# Patient Record
Sex: Female | Born: 1945 | Hispanic: No | Smoking: Never smoker
Health system: Southern US, Community
[De-identification: ages and names within clinical notes are randomized; demographics above are authoritative.]

## PROBLEM LIST (undated history)

## (undated) DIAGNOSIS — M069 Rheumatoid arthritis, unspecified: Secondary | ICD-10-CM

## (undated) DIAGNOSIS — T4145XA Adverse effect of unspecified anesthetic, initial encounter: Secondary | ICD-10-CM

## (undated) DIAGNOSIS — R053 Chronic cough: Secondary | ICD-10-CM

## (undated) DIAGNOSIS — M4316 Spondylolisthesis, lumbar region: Secondary | ICD-10-CM

## (undated) DIAGNOSIS — M51369 Other intervertebral disc degeneration, lumbar region without mention of lumbar back pain or lower extremity pain: Secondary | ICD-10-CM

## (undated) DIAGNOSIS — J189 Pneumonia, unspecified organism: Secondary | ICD-10-CM

## (undated) DIAGNOSIS — E785 Hyperlipidemia, unspecified: Secondary | ICD-10-CM

## (undated) DIAGNOSIS — I10 Essential (primary) hypertension: Secondary | ICD-10-CM

## (undated) DIAGNOSIS — N184 Chronic kidney disease, stage 4 (severe): Secondary | ICD-10-CM

## (undated) DIAGNOSIS — Z7961 Long term (current) use of immunomodulator: Secondary | ICD-10-CM

## (undated) DIAGNOSIS — Z972 Presence of dental prosthetic device (complete) (partial): Secondary | ICD-10-CM

## (undated) DIAGNOSIS — M109 Gout, unspecified: Secondary | ICD-10-CM

## (undated) DIAGNOSIS — K625 Hemorrhage of anus and rectum: Secondary | ICD-10-CM

## (undated) DIAGNOSIS — M329 Systemic lupus erythematosus, unspecified: Secondary | ICD-10-CM

## (undated) DIAGNOSIS — M1990 Unspecified osteoarthritis, unspecified site: Secondary | ICD-10-CM

## (undated) DIAGNOSIS — I4510 Unspecified right bundle-branch block: Secondary | ICD-10-CM

## (undated) DIAGNOSIS — E119 Type 2 diabetes mellitus without complications: Secondary | ICD-10-CM

## (undated) DIAGNOSIS — T8859XA Other complications of anesthesia, initial encounter: Secondary | ICD-10-CM

## (undated) DIAGNOSIS — D631 Anemia in chronic kidney disease: Secondary | ICD-10-CM

## (undated) DIAGNOSIS — M778 Other enthesopathies, not elsewhere classified: Secondary | ICD-10-CM

## (undated) DIAGNOSIS — N2581 Secondary hyperparathyroidism of renal origin: Secondary | ICD-10-CM

## (undated) DIAGNOSIS — H9190 Unspecified hearing loss, unspecified ear: Secondary | ICD-10-CM

## (undated) DIAGNOSIS — I499 Cardiac arrhythmia, unspecified: Secondary | ICD-10-CM

## (undated) DIAGNOSIS — J986 Disorders of diaphragm: Secondary | ICD-10-CM

## (undated) DIAGNOSIS — U099 Post covid-19 condition, unspecified: Secondary | ICD-10-CM

## (undated) DIAGNOSIS — N189 Chronic kidney disease, unspecified: Secondary | ICD-10-CM

## (undated) DIAGNOSIS — I5189 Other ill-defined heart diseases: Secondary | ICD-10-CM

## (undated) DIAGNOSIS — G4700 Insomnia, unspecified: Secondary | ICD-10-CM

## (undated) DIAGNOSIS — Z7901 Long term (current) use of anticoagulants: Secondary | ICD-10-CM

## (undated) DIAGNOSIS — I2510 Atherosclerotic heart disease of native coronary artery without angina pectoris: Secondary | ICD-10-CM

## (undated) DIAGNOSIS — N281 Cyst of kidney, acquired: Secondary | ICD-10-CM

## (undated) DIAGNOSIS — R911 Solitary pulmonary nodule: Secondary | ICD-10-CM

## (undated) DIAGNOSIS — M5030 Other cervical disc degeneration, unspecified cervical region: Secondary | ICD-10-CM

## (undated) DIAGNOSIS — G4730 Sleep apnea, unspecified: Secondary | ICD-10-CM

## (undated) DIAGNOSIS — R011 Cardiac murmur, unspecified: Secondary | ICD-10-CM

## (undated) DIAGNOSIS — M436 Torticollis: Secondary | ICD-10-CM

## (undated) DIAGNOSIS — G4733 Obstructive sleep apnea (adult) (pediatric): Secondary | ICD-10-CM

## (undated) DIAGNOSIS — I671 Cerebral aneurysm, nonruptured: Secondary | ICD-10-CM

## (undated) DIAGNOSIS — R9389 Abnormal findings on diagnostic imaging of other specified body structures: Secondary | ICD-10-CM

## (undated) DIAGNOSIS — I739 Peripheral vascular disease, unspecified: Secondary | ICD-10-CM

## (undated) DIAGNOSIS — K862 Cyst of pancreas: Secondary | ICD-10-CM

## (undated) DIAGNOSIS — E875 Hyperkalemia: Secondary | ICD-10-CM

## (undated) DIAGNOSIS — R51 Headache: Secondary | ICD-10-CM

## (undated) DIAGNOSIS — Z7902 Long term (current) use of antithrombotics/antiplatelets: Secondary | ICD-10-CM

## (undated) HISTORY — PX: CATARACT EXTRACTION: SUR2

## (undated) HISTORY — PX: CERVICAL FUSION: SHX112

## (undated) HISTORY — PX: CERVICAL SPINE SURGERY: SHX589

## (undated) HISTORY — PX: BACK SURGERY: SHX140

## (undated) HISTORY — PX: BARTHOLIN CYST MARSUPIALIZATION: SHX5383

## (undated) HISTORY — PX: EYE SURGERY: SHX253

---

## 2005-05-27 ENCOUNTER — Ambulatory Visit: Payer: Self-pay | Admitting: Internal Medicine

## 2005-06-25 ENCOUNTER — Ambulatory Visit (HOSPITAL_COMMUNITY): Admission: RE | Admit: 2005-06-25 | Discharge: 2005-06-26 | Payer: Self-pay | Admitting: Neurosurgery

## 2005-12-23 ENCOUNTER — Ambulatory Visit: Payer: Self-pay | Admitting: Internal Medicine

## 2006-01-04 ENCOUNTER — Encounter: Payer: Self-pay | Admitting: Neurosurgery

## 2006-01-19 ENCOUNTER — Encounter: Payer: Self-pay | Admitting: Neurosurgery

## 2006-02-19 ENCOUNTER — Encounter: Payer: Self-pay | Admitting: Neurosurgery

## 2006-02-26 ENCOUNTER — Ambulatory Visit: Payer: Self-pay | Admitting: Internal Medicine

## 2006-05-25 ENCOUNTER — Ambulatory Visit (HOSPITAL_COMMUNITY): Admission: RE | Admit: 2006-05-25 | Discharge: 2006-05-25 | Payer: Self-pay | Admitting: Internal Medicine

## 2006-05-30 ENCOUNTER — Ambulatory Visit (HOSPITAL_COMMUNITY): Admission: RE | Admit: 2006-05-30 | Discharge: 2006-05-30 | Payer: Self-pay | Admitting: Internal Medicine

## 2006-06-03 ENCOUNTER — Encounter: Payer: Self-pay | Admitting: Interventional Radiology

## 2006-06-07 DIAGNOSIS — I671 Cerebral aneurysm, nonruptured: Secondary | ICD-10-CM

## 2006-06-07 HISTORY — DX: Cerebral aneurysm, nonruptured: I67.1

## 2006-06-17 ENCOUNTER — Ambulatory Visit (HOSPITAL_COMMUNITY): Admission: RE | Admit: 2006-06-17 | Discharge: 2006-06-17 | Payer: Self-pay | Admitting: Interventional Radiology

## 2006-06-21 HISTORY — PX: CEREBRAL ANEURYSM REPAIR: SHX164

## 2006-06-21 HISTORY — PX: CARDIAC CATHETERIZATION: SHX172

## 2006-07-04 ENCOUNTER — Ambulatory Visit (HOSPITAL_COMMUNITY): Admission: RE | Admit: 2006-07-04 | Discharge: 2006-07-04 | Payer: Self-pay | Admitting: Interventional Radiology

## 2006-07-06 ENCOUNTER — Inpatient Hospital Stay (HOSPITAL_COMMUNITY): Admission: AD | Admit: 2006-07-06 | Discharge: 2006-07-09 | Payer: Self-pay | Admitting: Interventional Radiology

## 2006-07-18 HISTORY — PX: OTHER SURGICAL HISTORY: SHX169

## 2006-07-21 ENCOUNTER — Encounter: Payer: Self-pay | Admitting: Interventional Radiology

## 2006-08-05 ENCOUNTER — Ambulatory Visit: Payer: Self-pay | Admitting: Internal Medicine

## 2006-09-21 ENCOUNTER — Ambulatory Visit: Payer: Self-pay | Admitting: Internal Medicine

## 2006-09-26 ENCOUNTER — Ambulatory Visit (HOSPITAL_COMMUNITY): Admission: RE | Admit: 2006-09-26 | Discharge: 2006-09-27 | Payer: Self-pay | Admitting: Interventional Radiology

## 2006-10-03 HISTORY — PX: OTHER SURGICAL HISTORY: SHX169

## 2006-10-10 ENCOUNTER — Encounter: Payer: Self-pay | Admitting: Interventional Radiology

## 2007-02-10 ENCOUNTER — Other Ambulatory Visit: Payer: Self-pay | Admitting: Interventional Radiology

## 2007-02-13 ENCOUNTER — Other Ambulatory Visit: Payer: Self-pay | Admitting: Interventional Radiology

## 2007-02-15 ENCOUNTER — Ambulatory Visit (HOSPITAL_COMMUNITY): Admission: RE | Admit: 2007-02-15 | Discharge: 2007-02-15 | Payer: Self-pay | Admitting: Interventional Radiology

## 2007-02-23 ENCOUNTER — Ambulatory Visit: Payer: Self-pay | Admitting: Cardiovascular Disease

## 2007-02-23 DIAGNOSIS — I2510 Atherosclerotic heart disease of native coronary artery without angina pectoris: Secondary | ICD-10-CM

## 2007-02-23 HISTORY — DX: Atherosclerotic heart disease of native coronary artery without angina pectoris: I25.10

## 2007-02-23 HISTORY — PX: CARDIAC CATHETERIZATION: SHX172

## 2007-02-27 ENCOUNTER — Other Ambulatory Visit: Payer: Self-pay

## 2007-02-27 ENCOUNTER — Inpatient Hospital Stay: Payer: Self-pay | Admitting: Cardiovascular Disease

## 2007-02-27 HISTORY — PX: CORONARY STENT INTERVENTION: CATH118234

## 2007-03-22 ENCOUNTER — Encounter: Payer: Self-pay | Admitting: Cardiovascular Disease

## 2007-04-22 ENCOUNTER — Encounter: Payer: Self-pay | Admitting: Cardiovascular Disease

## 2007-05-23 ENCOUNTER — Ambulatory Visit: Payer: Self-pay | Admitting: Internal Medicine

## 2007-05-26 ENCOUNTER — Ambulatory Visit: Payer: Self-pay | Admitting: Internal Medicine

## 2007-06-20 ENCOUNTER — Ambulatory Visit: Payer: Self-pay | Admitting: Surgery

## 2007-06-26 ENCOUNTER — Ambulatory Visit: Payer: Self-pay | Admitting: Surgery

## 2007-06-26 HISTORY — PX: BREAST EXCISIONAL BIOPSY: SUR124

## 2007-07-21 ENCOUNTER — Inpatient Hospital Stay: Payer: Self-pay | Admitting: Internal Medicine

## 2007-07-21 ENCOUNTER — Other Ambulatory Visit: Payer: Self-pay

## 2007-07-31 HISTORY — PX: CARDIAC CATHETERIZATION: SHX172

## 2007-09-11 ENCOUNTER — Ambulatory Visit (HOSPITAL_COMMUNITY): Admission: RE | Admit: 2007-09-11 | Discharge: 2007-09-11 | Payer: Self-pay | Admitting: Interventional Radiology

## 2007-09-13 ENCOUNTER — Ambulatory Visit (HOSPITAL_COMMUNITY): Admission: RE | Admit: 2007-09-13 | Discharge: 2007-09-13 | Payer: Self-pay | Admitting: Neurosurgery

## 2007-10-27 ENCOUNTER — Inpatient Hospital Stay (HOSPITAL_COMMUNITY): Admission: RE | Admit: 2007-10-27 | Discharge: 2007-11-03 | Payer: Self-pay | Admitting: Neurosurgery

## 2008-04-12 ENCOUNTER — Ambulatory Visit (HOSPITAL_COMMUNITY): Admission: RE | Admit: 2008-04-12 | Discharge: 2008-04-12 | Payer: Self-pay | Admitting: Interventional Radiology

## 2008-04-15 ENCOUNTER — Ambulatory Visit (HOSPITAL_COMMUNITY): Admission: RE | Admit: 2008-04-15 | Discharge: 2008-04-15 | Payer: Self-pay | Admitting: Interventional Radiology

## 2008-05-29 ENCOUNTER — Ambulatory Visit: Payer: Self-pay | Admitting: Internal Medicine

## 2008-11-28 ENCOUNTER — Ambulatory Visit (HOSPITAL_COMMUNITY): Admission: RE | Admit: 2008-11-28 | Discharge: 2008-11-28 | Payer: Self-pay | Admitting: Interventional Radiology

## 2009-03-21 ENCOUNTER — Ambulatory Visit (HOSPITAL_COMMUNITY): Admission: RE | Admit: 2009-03-21 | Discharge: 2009-03-21 | Payer: Self-pay | Admitting: Interventional Radiology

## 2009-07-24 ENCOUNTER — Ambulatory Visit (HOSPITAL_COMMUNITY): Admission: RE | Admit: 2009-07-24 | Discharge: 2009-07-24 | Payer: Self-pay | Admitting: Interventional Radiology

## 2009-10-22 ENCOUNTER — Ambulatory Visit: Payer: Self-pay | Admitting: Internal Medicine

## 2010-02-02 ENCOUNTER — Ambulatory Visit: Payer: Self-pay | Admitting: Internal Medicine

## 2010-02-19 ENCOUNTER — Ambulatory Visit: Payer: Self-pay | Admitting: Internal Medicine

## 2010-04-27 ENCOUNTER — Ambulatory Visit (HOSPITAL_COMMUNITY): Admission: RE | Admit: 2010-04-27 | Discharge: 2010-04-27 | Payer: Self-pay | Admitting: Interventional Radiology

## 2010-05-21 ENCOUNTER — Ambulatory Visit (HOSPITAL_COMMUNITY)
Admission: RE | Admit: 2010-05-21 | Discharge: 2010-05-21 | Payer: Self-pay | Source: Home / Self Care | Admitting: Interventional Radiology

## 2010-07-09 ENCOUNTER — Other Ambulatory Visit (HOSPITAL_COMMUNITY): Payer: Self-pay | Admitting: Interventional Radiology

## 2010-07-09 DIAGNOSIS — I729 Aneurysm of unspecified site: Secondary | ICD-10-CM

## 2010-07-12 ENCOUNTER — Encounter: Payer: Self-pay | Admitting: Internal Medicine

## 2010-07-12 ENCOUNTER — Encounter: Payer: Self-pay | Admitting: Neurosurgery

## 2010-07-12 ENCOUNTER — Encounter: Payer: Self-pay | Admitting: Interventional Radiology

## 2010-08-06 ENCOUNTER — Encounter (HOSPITAL_COMMUNITY)
Admission: RE | Admit: 2010-08-06 | Discharge: 2010-08-06 | Disposition: A | Discharge status: Home / Self Care | Payer: PRIVATE HEALTH INSURANCE | Source: Ambulatory Visit | Attending: Interventional Radiology | Admitting: Interventional Radiology

## 2010-08-06 DIAGNOSIS — Z01812 Encounter for preprocedural laboratory examination: Secondary | ICD-10-CM | POA: Insufficient documentation

## 2010-08-06 LAB — DIFFERENTIAL
Basophils Absolute: 0 10*3/uL (ref 0.0–0.1)
Basophils Relative: 1 % (ref 0–1)
Eosinophils Absolute: 0.2 10*3/uL (ref 0.0–0.7)
Eosinophils Relative: 4 % (ref 0–5)
Lymphocytes Relative: 37 % (ref 12–46)
Lymphs Abs: 1.9 10*3/uL (ref 0.7–4.0)
Monocytes Absolute: 0.3 10*3/uL (ref 0.1–1.0)
Monocytes Relative: 6 % (ref 3–12)
Neutro Abs: 2.7 10*3/uL (ref 1.7–7.7)
Neutrophils Relative %: 53 % (ref 43–77)

## 2010-08-06 LAB — CBC
HCT: 35.5 % — ABNORMAL LOW (ref 36.0–46.0)
Hemoglobin: 11.8 g/dL — ABNORMAL LOW (ref 12.0–15.0)
MCH: 30.1 pg (ref 26.0–34.0)
MCHC: 33.2 g/dL (ref 30.0–36.0)
MCV: 90.6 fL (ref 78.0–100.0)
Platelets: 181 10*3/uL (ref 150–400)
RBC: 3.92 MIL/uL (ref 3.87–5.11)
RDW: 13.5 % (ref 11.5–15.5)
WBC: 5.2 10*3/uL (ref 4.0–10.5)

## 2010-08-06 LAB — PROTIME-INR
INR: 0.89 (ref 0.00–1.49)
Prothrombin Time: 12.2 seconds (ref 11.6–15.2)

## 2010-08-06 LAB — BASIC METABOLIC PANEL
BUN: 22 mg/dL (ref 6–23)
CO2: 25 mEq/L (ref 19–32)
Calcium: 9.3 mg/dL (ref 8.4–10.5)
Chloride: 113 mEq/L — ABNORMAL HIGH (ref 96–112)
Creatinine, Ser: 1.3 mg/dL — ABNORMAL HIGH (ref 0.4–1.2)
GFR calc Af Amer: 50 mL/min — ABNORMAL LOW (ref >60)
GFR calc non Af Amer: 41 mL/min — ABNORMAL LOW (ref >60)
Glucose, Bld: 124 mg/dL — ABNORMAL HIGH (ref 70–99)
Potassium: 4.4 mEq/L (ref 3.5–5.1)
Sodium: 146 mEq/L — ABNORMAL HIGH (ref 135–145)

## 2010-08-06 LAB — APTT: aPTT: 25 seconds (ref 24–37)

## 2010-08-10 ENCOUNTER — Ambulatory Visit (HOSPITAL_COMMUNITY): Admission: RE | Admit: 2010-08-10 | Payer: PRIVATE HEALTH INSURANCE | Source: Ambulatory Visit

## 2010-08-20 ENCOUNTER — Ambulatory Visit (HOSPITAL_COMMUNITY): Payer: PRIVATE HEALTH INSURANCE

## 2010-08-20 ENCOUNTER — Inpatient Hospital Stay (HOSPITAL_COMMUNITY)
Admission: RE | Admit: 2010-08-20 | Discharge: 2010-08-26 | DRG: 027 | Disposition: A | Discharge status: Home / Self Care | Payer: PRIVATE HEALTH INSURANCE | Source: Ambulatory Visit | Attending: Interventional Radiology | Admitting: Interventional Radiology

## 2010-08-20 LAB — BASIC METABOLIC PANEL
BUN: 22 mg/dL (ref 6–23)
CO2: 26 mEq/L (ref 19–32)
Calcium: 9.5 mg/dL (ref 8.4–10.5)
Chloride: 114 mEq/L — ABNORMAL HIGH (ref 96–112)
Creatinine, Ser: 1.11 mg/dL (ref 0.4–1.2)
GFR calc Af Amer: >60 mL/min (ref >60)
GFR calc non Af Amer: 50 mL/min — ABNORMAL LOW (ref >60)
Glucose, Bld: 129 mg/dL — ABNORMAL HIGH (ref 70–99)
Potassium: 4.4 mEq/L (ref 3.5–5.1)
Sodium: 148 mEq/L — ABNORMAL HIGH (ref 135–145)

## 2010-08-20 LAB — DIFFERENTIAL
Basophils Absolute: 0.1 10*3/uL (ref 0.0–0.1)
Basophils Relative: 1 % (ref 0–1)
Eosinophils Absolute: 0.2 10*3/uL (ref 0.0–0.7)
Eosinophils Relative: 4 % (ref 0–5)
Lymphocytes Relative: 37 % (ref 12–46)
Lymphs Abs: 2.1 10*3/uL (ref 0.7–4.0)
Monocytes Absolute: 0.3 10*3/uL (ref 0.1–1.0)
Monocytes Relative: 5 % (ref 3–12)
Neutro Abs: 3 10*3/uL (ref 1.7–7.7)
Neutrophils Relative %: 53 % (ref 43–77)

## 2010-08-20 LAB — CBC
HCT: 36.5 % (ref 36.0–46.0)
Hemoglobin: 12.4 g/dL (ref 12.0–15.0)
MCH: 30.9 pg (ref 26.0–34.0)
MCHC: 34 g/dL (ref 30.0–36.0)
MCV: 91 fL (ref 78.0–100.0)
Platelets: 190 10*3/uL (ref 150–400)
RBC: 4.01 MIL/uL (ref 3.87–5.11)
RDW: 13.6 % (ref 11.5–15.5)
WBC: 5.6 10*3/uL (ref 4.0–10.5)

## 2010-08-20 LAB — PROTIME-INR
INR: 0.9 (ref 0.00–1.49)
Prothrombin Time: 12.4 seconds (ref 11.6–15.2)

## 2010-08-20 LAB — GLUCOSE, CAPILLARY: Glucose-Capillary: 101 mg/dL — ABNORMAL HIGH (ref 70–99)

## 2010-08-20 LAB — APTT: aPTT: 27 seconds (ref 24–37)

## 2010-08-31 LAB — PROTIME-INR
INR: 0.92 (ref 0.00–1.49)
Prothrombin Time: 12.6 seconds (ref 11.6–15.2)

## 2010-08-31 LAB — CBC
HCT: 36.7 % (ref 36.0–46.0)
Hemoglobin: 12.3 g/dL (ref 12.0–15.0)
MCH: 30.6 pg (ref 26.0–34.0)
MCHC: 33.5 g/dL (ref 30.0–36.0)
MCV: 91.3 fL (ref 78.0–100.0)
Platelets: 161 10*3/uL (ref 150–400)
RBC: 4.02 MIL/uL (ref 3.87–5.11)
RDW: 12.9 % (ref 11.5–15.5)
WBC: 6.6 10*3/uL (ref 4.0–10.5)

## 2010-08-31 LAB — BASIC METABOLIC PANEL
BUN: 30 mg/dL — ABNORMAL HIGH (ref 6–23)
CO2: 28 mEq/L (ref 19–32)
Calcium: 9.6 mg/dL (ref 8.4–10.5)
Chloride: 111 mEq/L (ref 96–112)
Creatinine, Ser: 1.23 mg/dL — ABNORMAL HIGH (ref 0.4–1.2)
GFR calc Af Amer: 53 mL/min — ABNORMAL LOW (ref >60)
GFR calc non Af Amer: 44 mL/min — ABNORMAL LOW (ref >60)
Glucose, Bld: 126 mg/dL — ABNORMAL HIGH (ref 70–99)
Potassium: 4.4 mEq/L (ref 3.5–5.1)
Sodium: 145 mEq/L (ref 135–145)

## 2010-08-31 LAB — DIFFERENTIAL
Basophils Absolute: 0 10*3/uL (ref 0.0–0.1)
Basophils Relative: 1 % (ref 0–1)
Eosinophils Absolute: 0.2 10*3/uL (ref 0.0–0.7)
Eosinophils Relative: 4 % (ref 0–5)
Lymphocytes Relative: 35 % (ref 12–46)
Lymphs Abs: 2.3 10*3/uL (ref 0.7–4.0)
Monocytes Absolute: 0.3 10*3/uL (ref 0.1–1.0)
Monocytes Relative: 5 % (ref 3–12)
Neutro Abs: 3.7 10*3/uL (ref 1.7–7.7)
Neutrophils Relative %: 56 % (ref 43–77)

## 2010-08-31 LAB — APTT: aPTT: 25 seconds (ref 24–37)

## 2010-08-31 LAB — SURGICAL PCR SCREEN
MRSA, PCR: NEGATIVE
Staphylococcus aureus: NEGATIVE

## 2010-09-01 ENCOUNTER — Encounter (HOSPITAL_COMMUNITY)
Admission: RE | Admit: 2010-09-01 | Discharge: 2010-09-01 | Disposition: A | Discharge status: Home / Self Care | Payer: PRIVATE HEALTH INSURANCE | Source: Ambulatory Visit | Attending: Interventional Radiology | Admitting: Interventional Radiology

## 2010-09-01 DIAGNOSIS — Z01812 Encounter for preprocedural laboratory examination: Secondary | ICD-10-CM | POA: Insufficient documentation

## 2010-09-01 LAB — CBC
HCT: 37.8 % (ref 36.0–46.0)
HCT: 40.6 % (ref 36.0–46.0)
Hemoglobin: 12.8 g/dL (ref 12.0–15.0)
Hemoglobin: 13.8 g/dL (ref 12.0–15.0)
MCH: 30.8 pg (ref 26.0–34.0)
MCH: 30.9 pg (ref 26.0–34.0)
MCHC: 33.9 g/dL (ref 30.0–36.0)
MCHC: 34 g/dL (ref 30.0–36.0)
MCV: 91.1 fL (ref 78.0–100.0)
MCV: 90.8 fL (ref 78.0–100.0)
Platelets: 198 10*3/uL (ref 150–400)
Platelets: 176 10*3/uL (ref 150–400)
RBC: 4.15 MIL/uL (ref 3.87–5.11)
RBC: 4.47 MIL/uL (ref 3.87–5.11)
RDW: 13.4 % (ref 11.5–15.5)
RDW: 12.7 % (ref 11.5–15.5)
WBC: 5.6 10*3/uL (ref 4.0–10.5)
WBC: 5.7 10*3/uL (ref 4.0–10.5)

## 2010-09-01 LAB — BASIC METABOLIC PANEL
BUN: 20 mg/dL (ref 6–23)
BUN: 12 mg/dL (ref 6–23)
CO2: 26 mEq/L (ref 19–32)
CO2: 29 mEq/L (ref 19–32)
Calcium: 9.4 mg/dL (ref 8.4–10.5)
Calcium: 9.4 mg/dL (ref 8.4–10.5)
Chloride: 112 mEq/L (ref 96–112)
Chloride: 108 mEq/L (ref 96–112)
Creatinine, Ser: 1.09 mg/dL (ref 0.4–1.2)
Creatinine, Ser: 0.92 mg/dL (ref 0.4–1.2)
GFR calc Af Amer: >60 mL/min (ref >60)
GFR calc Af Amer: >60 mL/min (ref >60)
GFR calc non Af Amer: 51 mL/min — ABNORMAL LOW (ref >60)
GFR calc non Af Amer: >60 mL/min (ref >60)
Glucose, Bld: 118 mg/dL — ABNORMAL HIGH (ref 70–99)
Glucose, Bld: 136 mg/dL — ABNORMAL HIGH (ref 70–99)
Potassium: 4 mEq/L (ref 3.5–5.1)
Potassium: 3.7 mEq/L (ref 3.5–5.1)
Sodium: 146 mEq/L — ABNORMAL HIGH (ref 135–145)
Sodium: 144 mEq/L (ref 135–145)

## 2010-09-01 LAB — APTT
aPTT: 27 seconds (ref 24–37)
aPTT: 28 seconds (ref 24–37)

## 2010-09-01 LAB — DIFFERENTIAL
Basophils Absolute: 0.1 10*3/uL (ref 0.0–0.1)
Basophils Relative: 1 % (ref 0–1)
Eosinophils Absolute: 0.2 10*3/uL (ref 0.0–0.7)
Eosinophils Relative: 4 % (ref 0–5)
Lymphocytes Relative: 38 % (ref 12–46)
Lymphs Abs: 2.1 10*3/uL (ref 0.7–4.0)
Monocytes Absolute: 0.4 10*3/uL (ref 0.1–1.0)
Monocytes Relative: 7 % (ref 3–12)
Neutro Abs: 2.8 10*3/uL (ref 1.7–7.7)
Neutrophils Relative %: 50 % (ref 43–77)

## 2010-09-01 LAB — PROTIME-INR
INR: 0.89 (ref 0.00–1.49)
INR: 0.95 (ref 0.00–1.49)
Prothrombin Time: 12.2 seconds (ref 11.6–15.2)
Prothrombin Time: 12.9 seconds (ref 11.6–15.2)

## 2010-09-01 LAB — SURGICAL PCR SCREEN
MRSA, PCR: NEGATIVE
Staphylococcus aureus: NEGATIVE

## 2010-09-03 ENCOUNTER — Inpatient Hospital Stay (HOSPITAL_COMMUNITY)
Admission: RE | Admit: 2010-09-03 | Discharge: 2010-09-04 | DRG: 026 | Disposition: A | Discharge status: Home / Self Care | Payer: PRIVATE HEALTH INSURANCE | Source: Ambulatory Visit | Attending: Interventional Radiology | Admitting: Interventional Radiology

## 2010-09-03 ENCOUNTER — Ambulatory Visit (HOSPITAL_COMMUNITY): Payer: PRIVATE HEALTH INSURANCE

## 2010-09-03 DIAGNOSIS — M109 Gout, unspecified: Secondary | ICD-10-CM | POA: Diagnosis present

## 2010-09-03 DIAGNOSIS — Z7982 Long term (current) use of aspirin: Secondary | ICD-10-CM

## 2010-09-03 DIAGNOSIS — M329 Systemic lupus erythematosus, unspecified: Secondary | ICD-10-CM | POA: Diagnosis present

## 2010-09-03 DIAGNOSIS — Z79899 Other long term (current) drug therapy: Secondary | ICD-10-CM

## 2010-09-03 DIAGNOSIS — I671 Cerebral aneurysm, nonruptured: Principal | ICD-10-CM | POA: Diagnosis present

## 2010-09-03 DIAGNOSIS — Q602 Renal agenesis, unspecified: Secondary | ICD-10-CM

## 2010-09-03 DIAGNOSIS — D649 Anemia, unspecified: Secondary | ICD-10-CM | POA: Diagnosis present

## 2010-09-03 DIAGNOSIS — Z7902 Long term (current) use of antithrombotics/antiplatelets: Secondary | ICD-10-CM

## 2010-09-03 DIAGNOSIS — E119 Type 2 diabetes mellitus without complications: Secondary | ICD-10-CM | POA: Diagnosis present

## 2010-09-03 DIAGNOSIS — I729 Aneurysm of unspecified site: Secondary | ICD-10-CM

## 2010-09-03 DIAGNOSIS — I10 Essential (primary) hypertension: Secondary | ICD-10-CM | POA: Diagnosis present

## 2010-09-03 DIAGNOSIS — M069 Rheumatoid arthritis, unspecified: Secondary | ICD-10-CM | POA: Diagnosis present

## 2010-09-03 HISTORY — PX: OTHER SURGICAL HISTORY: SHX169

## 2010-09-03 LAB — CBC
HCT: 31.9 % — ABNORMAL LOW (ref 36.0–46.0)
Hemoglobin: 11.2 g/dL — ABNORMAL LOW (ref 12.0–15.0)
MCH: 31.1 pg (ref 26.0–34.0)
MCHC: 35.1 g/dL (ref 30.0–36.0)
MCV: 88.6 fL (ref 78.0–100.0)
Platelets: 159 10*3/uL (ref 150–400)
RBC: 3.6 MIL/uL — ABNORMAL LOW (ref 3.87–5.11)
RDW: 12.9 % (ref 11.5–15.5)
WBC: 4.7 10*3/uL (ref 4.0–10.5)

## 2010-09-03 LAB — GLUCOSE, CAPILLARY: Glucose-Capillary: 118 mg/dL — ABNORMAL HIGH (ref 70–99)

## 2010-09-03 LAB — HEPARIN LEVEL (UNFRACTIONATED): Heparin Unfractionated: 0.17 IU/mL — ABNORMAL LOW (ref 0.30–0.70)

## 2010-09-03 MED ORDER — IOHEXOL 300 MG/ML  SOLN: 150.0000 mL | Freq: Once | INTRAMUSCULAR | Status: DC | PRN

## 2010-09-03 MED ORDER — IOHEXOL 300 MG/ML  SOLN
400.0000 mL | Freq: Once | INTRAMUSCULAR | Status: AC | PRN
  Administered 2010-09-03: 130 mL via INTRA_ARTERIAL

## 2010-09-04 LAB — CBC
HCT: 29.9 % — ABNORMAL LOW (ref 36.0–46.0)
Hemoglobin: 10.4 g/dL — ABNORMAL LOW (ref 12.0–15.0)
MCH: 30.7 pg (ref 26.0–34.0)
MCHC: 34.8 g/dL (ref 30.0–36.0)
MCV: 88.2 fL (ref 78.0–100.0)
Platelets: 144 10*3/uL — ABNORMAL LOW (ref 150–400)
RBC: 3.39 MIL/uL — ABNORMAL LOW (ref 3.87–5.11)
RDW: 12.7 % (ref 11.5–15.5)
WBC: 7.2 10*3/uL (ref 4.0–10.5)

## 2010-09-04 LAB — BASIC METABOLIC PANEL
BUN: 23 mg/dL (ref 6–23)
CO2: 24 mEq/L (ref 19–32)
Calcium: 8.1 mg/dL — ABNORMAL LOW (ref 8.4–10.5)
Chloride: 112 mEq/L (ref 96–112)
Creatinine, Ser: 1.27 mg/dL — ABNORMAL HIGH (ref 0.4–1.2)
GFR calc Af Amer: 51 mL/min — ABNORMAL LOW (ref >60)
GFR calc non Af Amer: 43 mL/min — ABNORMAL LOW (ref >60)
Glucose, Bld: 240 mg/dL — ABNORMAL HIGH (ref 70–99)
Potassium: 4.1 mEq/L (ref 3.5–5.1)
Sodium: 141 mEq/L (ref 135–145)

## 2010-09-04 LAB — POCT ACTIVATED CLOTTING TIME
Activated Clotting Time: 158 seconds
Activated Clotting Time: 187 seconds
Activated Clotting Time: 193 seconds

## 2010-09-04 LAB — HEPARIN LEVEL (UNFRACTIONATED): Heparin Unfractionated: <0.1 IU/mL — ABNORMAL LOW (ref 0.30–0.70)

## 2010-09-04 LAB — GLUCOSE, CAPILLARY: Glucose-Capillary: 175 mg/dL — ABNORMAL HIGH (ref 70–99)

## 2010-09-06 LAB — CBC
HCT: 36.4 % (ref 36.0–46.0)
Hemoglobin: 12.4 g/dL (ref 12.0–15.0)
MCHC: 34.2 g/dL (ref 30.0–36.0)
MCV: 97.4 fL (ref 78.0–100.0)
Platelets: 170 10*3/uL (ref 150–400)
RBC: 3.73 MIL/uL — ABNORMAL LOW (ref 3.87–5.11)
RDW: 13.9 % (ref 11.5–15.5)
WBC: 5.6 10*3/uL (ref 4.0–10.5)

## 2010-09-06 LAB — BASIC METABOLIC PANEL
BUN: 23 mg/dL (ref 6–23)
CO2: 25 mEq/L (ref 19–32)
Calcium: 9.1 mg/dL (ref 8.4–10.5)
Chloride: 110 mEq/L (ref 96–112)
Creatinine, Ser: 1.06 mg/dL (ref 0.4–1.2)
GFR calc Af Amer: >60 mL/min (ref >60)
GFR calc non Af Amer: 53 mL/min — ABNORMAL LOW (ref >60)
Glucose, Bld: 179 mg/dL — ABNORMAL HIGH (ref 70–99)
Potassium: 3.9 mEq/L (ref 3.5–5.1)
Sodium: 143 mEq/L (ref 135–145)

## 2010-09-06 LAB — DIFFERENTIAL
Basophils Absolute: 0 10*3/uL (ref 0.0–0.1)
Basophils Relative: 0 % (ref 0–1)
Eosinophils Absolute: 0.2 10*3/uL (ref 0.0–0.7)
Eosinophils Relative: 4 % (ref 0–5)
Lymphocytes Relative: 33 % (ref 12–46)
Lymphs Abs: 1.8 10*3/uL (ref 0.7–4.0)
Monocytes Absolute: 0.3 10*3/uL (ref 0.1–1.0)
Monocytes Relative: 6 % (ref 3–12)
Neutro Abs: 3.1 10*3/uL (ref 1.7–7.7)
Neutrophils Relative %: 57 % (ref 43–77)

## 2010-09-06 LAB — PROTIME-INR
INR: 0.97 (ref 0.00–1.49)
Prothrombin Time: 12.8 seconds (ref 11.6–15.2)

## 2010-09-06 LAB — APTT: aPTT: 28 seconds (ref 24–37)

## 2010-09-14 NOTE — Discharge Summary (Signed)
  [name], [name]          ACCOUNT NO.:  [id number]  MEDICAL RECORD NO.:  [id number]           PATIENT TYPE:  I  LOCATION:  [id number]                         FACILITY:  [facility]  PHYSICIAN:  Braxdon Gappa K. 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  TREATMENT:  Right middle cerebral artery aneurysm, stent placement by Dr. Luanne Bras on September 03, 2010.  The patient tolerated procedure well without complication.  Admitted to Neuro ICU post procedure.  Uncomplicated procedure and uncomplicated stay.  The patient did well overnight without complaint.  Denies nausea, vomiting, or headache.  OBJECTIVE:  HEART:  Regular rate and rhythm. LUNGS:  Clear to auscultation. ABDOMEN:  Soft, positive bowel sounds, nontender. EXTREMITIES:  Full range of motion; right groin nontender, no bleeding, no hematoma. NEURO:  Extraocular movements intact; alert and oriented; appropriate; face symmetrical; smile equal; good grips equal bilaterally; good strength in all extremities. VITAL SIGNS:  Stable.  LABORATORY STUDIES:  BUN 23, creatinine 1.27, hemoglobin 10.4, hematocrit 29.9, glucose 240 at 3:30 a.m., repeat at 8:30 a.m. 175.  MEDICATIONS AT DISCHARGE: 1. Allopurinol 100 mg. 2. Ambien 5 mg. 3. Aspirin 81 mg. 4. Atenolol 100 mg. 5. Crestor 20 mg. 6. Diovan 160 mg. 7. Exforge 10/320 mg. 8. Hydroxychloroquine 200 mg. 9. Plavix 75 mg.  CONDITION AT DISCHARGE:  Stable.  INSTRUCTIONS: 1. No lifting for 2 weeks. 2. No driving for 2 weeks. 3. Restful for 48 hours. 4. Continue all home medications. 5. Followup appointment is made with Dr. Estanislado Pandy for September 17, 2010     at 2 p.m.  The patient is to follow up with Dr. Estanislado Pandy at [facility].     Lesleigh Noe Nonie Hoyer, P.A.   ______________________________ Fritz Pickerel Estanislado Pandy, M.D.    PAT/MEDQ  D:  09/04/2010  T:  09/05/2010  Job:  [id number]  Electronically Signed by Monia Sabal P.A. on 09/10/2010 11:42:21 AM Electronically Signed by Luanne Bras M.D. on 09/14/2010 01:45:54 PM

## 2010-09-14 NOTE — H&P (Signed)
  [name], [name]          ACCOUNT NO.:  [id number]  MEDICAL RECORD NO.:  [id number]           PATIENT TYPE:  I  LOCATION:  [id number]                         FACILITY:  [facility]  PHYSICIAN:  Farrel Guimond K. Gaddiel Cullens, M.D.DATE OF BIRTH:  1946-05-13  DATE OF ADMISSION:  09/03/2010 DATE OF DISCHARGE:                             HISTORY & PHYSICAL   I saw the patient at 7:20 a.m.  SUBJECTIVE:  A 65 year old female, established patient of Dr. Estanislado Pandy. 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  She is now scheduled for additional coil to right middle cerebral artery aneurysm, neck remnant.  The patient is aware of procedure benefits and risks.  Agreeable to proceed.  PAST MEDICAL HISTORY: 1. Renal insufficiency. 2. Anemia. 3. Nodule on chest x-ray. 4. Diabetes. 5. Hypertension. 6. Cardiac murmur per echocardiogram. 7. Congenital solitary kidney. 8. Lupus. 9. Rheumatoid arthritis. 10.Gout.  SURGICAL HISTORY: 1. Cervical spine surgery. 2. Bilateral cataract surgery.  ALLERGIES:  SULFA.  MEDICATIONS: 1. Diovan 160 mg. 2. Aspirin 81 mg. 3. Crestor 20 mg. 4. Plavix 75 mg. 5. Hydroxychloroquine 200 mg. 6. Exforge 10/320 mg. 7. Atenolol 100 mg. 8. Ambien 5 mg. 9. Allopurinol 100 mg.  OBJECTIVE:  HEENT:  Extraocular movements intact. GENERAL:  Alert and oriented; appropriate; face is symmetrical; smile is bilaterally equal. HEART:  Regular rate and rhythm without murmur. LUNGS:  Clear to auscultation. ABDOMEN:  Soft, positive bowel sounds, nontender. EXTREMITIES:  Full range of motion; steady gait.  ASSESSMENT:  Right middle cerebral artery aneurysm, neck remnant.  PLAN: 1. Additional coiling with Dr. Estanislado Pandy today. 2. Plan for admit to 3100 overnight. 3. Discharge on September 04, 2010 a.m. if stable.     Lesleigh Noe Nonie Hoyer, P.A.   ______________________________ Fritz Pickerel  Estanislado Pandy, M.D.    PAT/MEDQ  D:  09/03/2010  T:  09/04/2010  Job:  [id number]  Electronically Signed by Monia Sabal P.A. on 09/10/2010 11:41:38 AM Electronically Signed by Luanne Bras M.D. on 09/14/2010 01:45:52 PM

## 2010-09-17 ENCOUNTER — Ambulatory Visit (HOSPITAL_COMMUNITY)
Admit: 2010-09-17 | Discharge: 2010-09-17 | Disposition: A | Discharge status: Home / Self Care | Payer: PRIVATE HEALTH INSURANCE | Attending: Interventional Radiology | Admitting: Interventional Radiology

## 2010-09-28 LAB — BASIC METABOLIC PANEL
BUN: 19 mg/dL (ref 6–23)
CO2: 28 mEq/L (ref 19–32)
Calcium: 9 mg/dL (ref 8.4–10.5)
Chloride: 113 mEq/L — ABNORMAL HIGH (ref 96–112)
Creatinine, Ser: 1.03 mg/dL (ref 0.4–1.2)
GFR calc Af Amer: >60 mL/min (ref >60)
GFR calc non Af Amer: 54 mL/min — ABNORMAL LOW (ref >60)
Glucose, Bld: 128 mg/dL — ABNORMAL HIGH (ref 70–99)
Potassium: 3.7 mEq/L (ref 3.5–5.1)
Sodium: 144 mEq/L (ref 135–145)

## 2010-09-28 LAB — CBC
HCT: 34.7 % — ABNORMAL LOW (ref 36.0–46.0)
Hemoglobin: 11.6 g/dL — ABNORMAL LOW (ref 12.0–15.0)
MCHC: 33.6 g/dL (ref 30.0–36.0)
MCV: 93.7 fL (ref 78.0–100.0)
Platelets: 180 10*3/uL (ref 150–400)
RBC: 3.7 MIL/uL — ABNORMAL LOW (ref 3.87–5.11)
RDW: 14.4 % (ref 11.5–15.5)
WBC: 4.8 10*3/uL (ref 4.0–10.5)

## 2010-09-28 LAB — PROTIME-INR
INR: 1 (ref 0.00–1.49)
Prothrombin Time: 13.3 seconds (ref 11.6–15.2)

## 2010-09-28 LAB — GLUCOSE, CAPILLARY: Glucose-Capillary: 115 mg/dL — ABNORMAL HIGH (ref 70–99)

## 2010-09-28 LAB — APTT: aPTT: 28 seconds (ref 24–37)

## 2010-10-28 ENCOUNTER — Ambulatory Visit: Payer: Self-pay | Admitting: Cardiovascular Disease

## 2010-10-28 HISTORY — PX: CARDIAC CATHETERIZATION: SHX172

## 2010-11-03 NOTE — H&P (Signed)
[name], TELLER          ACCOUNT NO.:  [id number]   MEDICAL RECORD NO.:  [id number]          PATIENT TYPE:  INP   LOCATION:  2899                         FACILITY:  [facility]   PHYSICIAN:  Leeroy Cha, M.D.   DATE OF BIRTH:  April 06, 1946   DATE OF ADMISSION:  10/27/2007  DATE OF DISCHARGE:                              HISTORY & PHYSICAL   HISTORY OF PRESENT ILLNESS:  [name] was seen by me  because of neck pain and back pain.  Eventually, she underwent fusion at  the level  5-6 and 6-7.  Also, it was found that she has had brain  aneurysm which were called by the x-ray department.  Now, she has been  complaining of back pain with radiation to both legs and we knew by x-  ray that she has spondylolisthesis at the level L4 and L5 with bilateral  radiculopathy.  She has continued to get worse and with more pain  especially with walking, and she wants to proceed with surgery.   PAST MEDICAL HISTORY:  Cervical fusion, coiling of the aneurysm of the  brain.   REVIEW OF SYSTEMS:  Positive for diabetes, high blood pressure, one  kidney, and leg pain.   FAMILY HISTORY:  Unremarkable.   PHYSICAL EXAMINATION:  The patient went to my office and she was walking  short steps in the office.  HEAD, EARS, NOSE, AND THROAT:  Normal.  NECK:  She has a scar anteriorly.  She has minimal limitation of neck  movement.  LUNGS:  Clear.  HEART:  Sounds normal.  ABDOMEN:  Normal.  EXTREMITIES:  Normal pulse.  NEURO:  Showed that she has a weakness of dorsiflexion of both feet.  Straight leg raising is positive 60 degrees.  She has had decreased  flexibility of lumbar spine.   X-rays show L4-5 spondylolisthesis.   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   PLAN:  The procedure will be L4 GILL procedure with interbody fusion,  diskectomy, pedicle  screws, and posterolateral arthrodesis.  The risk  was fully explained to the patient in the office including the  possibility of pseudoarthrosis, infection, bleeding, no healing, need of  further procedure.  All the risks associated with blood pressure and  diabetes.           ______________________________  Leeroy Cha, M.D.     EB/MEDQ  D:  10/27/2007  T:  10/27/2007  Job:  [id number]

## 2010-11-03 NOTE — Op Note (Signed)
NAMESHAE, [name]          ACCOUNT NO.:  [id number]   MEDICAL RECORD NO.:  [id number]          PATIENT TYPE:  INP   LOCATION:  3038                         FACILITY:  [facility]   PHYSICIAN:  Leeroy Cha, M.D.   DATE OF BIRTH:  09-05-1945   DATE OF PROCEDURE:  10/27/2007  DATE OF DISCHARGE:                               OPERATIVE REPORT   PREOPERATIVE DIAGNOSES:  L4-L5 stenosis with a grade 1  spondylolisthesis; chronic radiculopathy, status post cervical fusion.   POSTOPERATIVE DIAGNOSES:  L4-L5 stenosis with a grade 1  spondylolisthesis; chronic radiculopathy, status post cervical fusion.   PROCEDURES:  An L4 Gill procedure with a laminectomy and facetectomy  bilaterally, L4-L5 diskectomy, interbody fusion with cages 12 x 22-mm,  pedicle screws L4-L5, posterolateral arthrodesis L4-L5, Cell Saver, C-  arm.   SURGEON:  Leeroy Cha, MD   ASSISTANT:  Otilio Connors, MD   CLINICAL HISTORY:  The patient has been followed by me for several  years.  In the past, she underwent fusion as well as coiling of her  brain aneurysm.  Now, she is getting worse with back pain rushing to  both legs.  X-ray showed that she is getting worse at the L4-L5 with  spondylolisthesis.  Surgery was advised.   PROCEDURE:  The patient was taken to the OR and she was positioned in a  prone manner.  The skin was cleaned with DuraPrep.  Drapes were applied.  A midline incision from L4 down to L5 __________  was made and muscle  was retracted all the way to a lateral view.  The anatomy was difficult  because the space in the left side at L5-S1 was closed and it was opened  in the right side.  We took two x-rays and we were able to localize the  L4-L5 disk space.  Then, we proceeded with a Gill procedure, removing  the spinous process, lamina, and the facet of L4.  The patient had quite  a bit of adhesions. Lysis of adhesions to decompress the L4 and L5 nerve  root was made.  We entered the disk  space bilaterally doing a total  gross diskectomy removing the endplates.  Then, two cages of 12 x 22  with BMP at the tip and autograft in the rest were introduced without  any problem.  Then, using the C-arm, AP view and then lateral, we probed  the pedicles of L4 and L5.  At the end, we introduced four screws of 5.5  x 45.  The medial aspect of the pedicle were intact and there was no  compromise with the nerve.  The screws were connected in place with a  rod and caps.  Then, we went laterally and we removed the periosteum of  the lateral aspect of the facet at L4-L5.  A mix of autograft and BMP  was used for arthrodesis.  From thereon, Valsalva maneuver was negative.  The wound was closed with Vicryl and Steri-Strips.           ______________________________  Leeroy Cha, M.D.     EB/MEDQ  D:  10/27/2007  T:  10/28/2007  Job:  [id number]

## 2010-11-03 NOTE — Discharge Summary (Signed)
[name], [name]          ACCOUNT NO.:  [id number]   MEDICAL RECORD NO.:  [id number]          PATIENT TYPE:  INP   LOCATION:  [id number]                         FACILITY:  [facility]   PHYSICIAN:  Leeroy Cha, M.D.   DATE OF BIRTH:  1945-07-09   DATE OF ADMISSION:  10/27/2007  DATE OF DISCHARGE:  11/03/2007                               DISCHARGE SUMMARY   ADMISSION DIAGNOSES:  1. L4-L5 spondylolisthesis.  2. Status post coiling of 2 brain aneurysms.  3. Status post cervical fusion.   FINAL DIAGNOSES:  1. L4-L5 spondylolisthesis.  2. Status post coiling of 2 brain aneurysms.  3. Status post cervical fusion.   CLINIC HISTORY:  The patient was admitted to the hospital because of  back pain radiating to both legs.  We knew that she has a bad case of  spondylolisthesis.  Surgery was advised.   LABORATORY:  Normal.   COURSE IN THE HOSPITAL:  The patient was taken to the surgery, and she  had a L4-L5 fusion.  From the beginning, the postop was stable, but she  complained of a lot of back pain, numbness, and tingling sensation, and  she was quite slow to move around.  Nevertheless, for the past  __________, she developed severe occasional constipation and she again  was confused.  Yesterday, she felt still more better.  She has been more  active.  She is going to be discharged today to be followed by me in my  office.  One of the problem is that she is going to go to [place] by car  and later on to [place].  I will like to see her in my office 2  days prior to that trip, so I can be sure that she is feeling nicely.   DIET:  I have advised her to lose weight.   ACTIVITY:  Not to bend.   FOLLOWUP:  To call my office for an appointment.   CONDITION ON DISCHARGE:  Better.           ______________________________  Leeroy Cha, M.D.     EB/MEDQ  D:  11/03/2007  T:  11/03/2007  Job:  [id number]

## 2010-11-06 NOTE — Consult Note (Signed)
NAMEKALYANI, [name]          ACCOUNT NO.:  [id number]   MEDICAL RECORD NO.:  [id number]          PATIENT TYPE:  OUT   LOCATION:  XRAY                         FACILITY:  [facility]   PHYSICIAN:  Sanjeev K. Deveshwar, M.D.DATE OF BIRTH:  1945-07-24   DATE OF CONSULTATION:  07/21/2006  DATE OF DISCHARGE:                                 CONSULTATION   CHIEF COMPLAINT:  Status post coiling of a cerebral aneurysm performed  July 07, 2006.   HISTORY OF PRESENT ILLNESS:  This is a very pleasant 65 year old female  with a history of severe headaches evaluated by her primary care  physician, Dr. Lamonte Sakai with an MRI/MRA.  The patient was found to  have multiple cerebral aneurysms.  She was referred to Dr. Estanislado Pandy for  further evaluation and treatment.   The patient also has a history of a solitary kidney with some renal  insufficiency.  She was admitted to [facility] July 06, 2006  for IV hydration and a renal consult prior to intervention on July 07, 2006.  She was seen in consultation by Dr. Salem Senate.  She was  hydrated.  Her medications were adjusted.  She underwent coiling of two  anterior communicating artery aneurysms on July 07, 2006 by Dr.  Estanislado Pandy under general anesthesia.  She tolerated this well.  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   During her hospital stay, she was [facility] for  her renal insufficiency.  She also required transfusion of 1 unit of  packed red blood cells secondary to anemia.  She was found to have a  nodule on her chest x-ray and a CT scan was performed.  This nodule was  suspicious and a follow-up CT was recommended in approximately 3 months.  The patient returns today to see Dr. Estanislado Pandy in followup.   PAST MEDICAL HISTORY:  Please see history as noted above.  She has  diabetes mellitus, hypertension, history of a  cardiac murmur.  She had a  2-D echo performed in 2007.  We do not have the results but she requires  antibiotic therapy prior to any procedures.  As noted, she has a  solitary kidney which was felt to be congenital.  She also has chronic  renal insufficiency, a history of systemic lupus as well as a history of  gout.  She had hypokalemia during her last admission.  She has a history  of hypertension.   ALLERGIES:  THE PATIENT IS ALLERGIC TO SULFA.  SHE DID DEVELOP A COUGH  SECONDARY TO HER PLAVIX.   MEDICATIONS:  Lisinopril for hypertension, Lasix, aspirin 81 mg daily,  colchicine, glyburide, Januvia, atenolol, Prilosec, Zocor, allopurinol,  Boniva, and she had been on Naprosyn as well as methotrexate for her  arthritis.   SOCIAL HISTORY:  The patient is divorced.  She has three children. She  lives in [place] with one of her daughters.  She does not use  tobacco; she never has.  She drinks wine occasionally.  She is currently  unemployed.  FAMILY HISTORY:  The patient's mother is alive at age 109.  She has  arthritis and heart trouble.  Her father died in his 87s from heart  trouble.  She has a sister who is alive with asthma, a sister who died  in a scuba diving accident, a brother with gout and another brother with  hypertension.   IMPRESSION AND PLAN:  As noted the patient is seen in followup by Dr.  Estanislado Pandy today following coiling of two anterior communicating artery  aneurysms performed July 07, 2006.   The patient did follow up with Dr. Humphrey Rolls, her primary care physician,  shortly after discharge from the hospital for a repeat CBC and chemistry  profile.  The patient believes these were sent to Dr. Estanislado Pandy,  however, we do not believe we ever received the results of these tests.  We have contacted Dr. Laurelyn Sickle office today to see if we could have the  follow-up blood work faxed to our office.   The patient also reported that she developed some boils or a rash in  her  vaginal area following her admission.  Dr. Estanislado Pandy had no insight as  to what may have caused this.  He recommended that she follow up with  her primary care physician or her OB/GYN physician if this continues to  be a problem.   The patient states she developed a cough with Plavix.  Dr. Estanislado Pandy had  one other patient who had this and it is a documented possible side  effect of Plavix.  She may need Plavix in the near future temporarily  when her other aneurysm is treated.   We have contacted Dr. Lorrene Reid regarding followup with the nephrologist.  Dr. Lorrene Reid did not feel that followup was needed at this time unless Dr.  Humphrey Rolls did not feel comfortable managing the patient's renal status.  The  plans will be to re-consult nephrology when the patient is readmitted to  the hospital for further treatment of her residual aneurysm.  The  patient reports that she is currently back on all of her previous  medications.  She will need close monitoring of her renal function.   As noted, the patient had a nodule on her chest x-ray just prior to  admission.  A chest CT confirmed this nodule and a follow-up CT was  recommended in 3 months.  In reviewing the patient's previous images,  Dr. Estanislado Pandy noted that she had a chest x-ray approximately 1 year ago  that did not show this nodule.  He felt that the patient needed a  pulmonology evaluation.  We will contact Dr. Laurelyn Sickle office to see if she  would like to make the referral or we would be happy to refer the  patient to a pulmonologist if this is Dr. Laurelyn Sickle preference   The patient also has history of back pain.  She has spinal stenosis by  MRI.  She is followed by Dr. Leeroy Cha for this problem.  Surgery  has been planned at some point in the future however, at this time the  patient has multiple medical issues and she states her back pain is not  severe.  The patient has been tentatively scheduled for treatment of her  remaining  cerebral aneurysm on October 06, 2006.  As noted we have  contacted Dr. Laurelyn Sickle office today to discuss referral to a pulmonologist  and to obtain a copy of the patient's most recent blood work.   Greater than 30 minutes was  spent on this consult.      Mikey Bussing, P.A.    ______________________________  Fritz Pickerel. Estanislado Pandy, M.D.   DR/MEDQ  D:  07/21/2006  T:  07/21/2006  Job:  [id number]   cc:   Kem Boroughs, M.D.  Richard F. Hassell Done, M.D.  Elzie Rings Lorrene Reid, M.D.

## 2010-11-06 NOTE — Op Note (Signed)
NAMEARYKA, TREECE          ACCOUNT NO.:  [id number]   MEDICAL RECORD NO.:  [id number]          PATIENT TYPE:  INP   LOCATION:  2899                         FACILITY:  [facility]   PHYSICIAN:  Leeroy Cha, M.D.   DATE OF BIRTH:  1946-01-02   DATE OF PROCEDURE:  DATE OF DISCHARGE:                                 OPERATIVE REPORT   Audio too short to transcribe (less than 5 seconds)           ______________________________  Leeroy Cha, M.D.     EB/MEDQ  D:  06/25/2005  T:  06/25/2005  Job:  [id number]

## 2010-11-06 NOTE — Consult Note (Signed)
NAMEBRYNLY, DEROGATIS          ACCOUNT NO.:  [id number]   MEDICAL RECORD NO.:  [id number]          PATIENT TYPE:  OUT   LOCATION:  XRAY                         FACILITY:  [facility]   PHYSICIAN:  Sanjeev K. Deveshwar, M.D.DATE OF BIRTH:  [birth year]   DATE OF CONSULTATION:  06/03/2006  DATE OF DISCHARGE:                                 CONSULTATION   BRIEF ADDENDUM:  As noted, Dr. Joya Salm has previously seen this patient  for cervical spine surgery in January 2007.  He is aware of her possible  cerebral aneurysms.  Dr. Estanislado Pandy will be discussing the case with Dr.  Joya Salm and her intervention will be scheduled for a time when Dr. Joya Salm  is in time and available if needed.   The patient will also require antibiotic therapy prior to the  intervention for her history of a cardiac valvular abnormality.      Mikey Bussing, P.A.    ______________________________  Fritz Pickerel. Estanislado Pandy, M.D.    DR/MEDQ  D:  06/03/2006  T:  06/03/2006  Job:  [id number]   cc:   Kem Boroughs, M.D.

## 2010-11-06 NOTE — Discharge Summary (Signed)
NAMEETHLEEN, FREDERICKS          ACCOUNT NO.:  [id number]   MEDICAL RECORD NO.:  [id number]          PATIENT TYPE:  INP   LOCATION:  3111                         FACILITY:  [facility]   PHYSICIAN:  Sanjeev K. Deveshwar, M.D.DATE OF BIRTH:  [birth year]   DATE OF ADMISSION:  07/06/2006  DATE OF DISCHARGE:  07/08/2006                         DISCHARGE SUMMARY - REFERRING   CHIEF COMPLAINT:  Cerebral aneurysms.   HISTORY OF PRESENT ILLNESS:  This is a very pleasant 65 year old female  with a history of severe headaches.  She was evaluated by her primary  care physician, Dr. Lamonte Sakai, who ordered an MRI/MRA which was  consistent with a possible aneurysm.  A cerebral angiogram was  eventually performed that showed two anterior communicating artery  region aneurysms, one measuring 6.2 mm x 4.5 mm, the other 4.9 mm x 4.2  mm.  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   The patient was seen in consultation by Dr. Estanislado Pandy on June 03, 2006, and arrangements were made to have the patient return to [place]  [facility] July 04, 2006, for treatment of her aneurysms.  Unfortunately, her creatinine weakness elevated at 1.5 and Dr. Estanislado Pandy  did not feel comfortable proceeding due to the amount of dye that would  be required for the intervention.  A decision was made to have the  patient return to [place] July 06, 2006, to  be admitted for hydration and a renal consult prior to proceeding with  intervention on July 07, 2006.   PAST MEDICAL HISTORY:  1. Diabetes mellitus.  2. Hypertension.  3. She has a history of a cardiac murmur.  She had a 2-D echo      performed in 2007.  We do not have the results of this study,      however, the patient was instructed to receive antibiotic therapy      prior to any interventions.  4. 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  5. She has a history of systemic lupus.  6. She has had renal insufficiency the past.  At one point her      cerebral angiogram had to be canceled due to creatinine of 2.   PAST SURGICAL HISTORY:  1. Cervical spine surgery performed by Dr. Joya Salm in June 2007.  2. She has also had bilateral cataract surgery.  3. She has had surgery for Bartholin's cyst.  She reports difficulty waking up from anesthesia.   ALLERGIES:  Patient is allergic to SULFA.   MEDICATIONS:  1. Lisinopril 20 mg daily.  2. Glyburide 1.25 mg daily.  3. Jenuvia 100 mg daily.  4. Atenolol 50 mg daily.  5. Prilosec 40 mg daily.  6. Zocor 40 mg daily.  7. Colchicine 0.6 mg daily.  8. Allopurinol 300 mg b.i.d.  9. Plavix was started three days prior to admission in anticipation of      possible stenting.  10.Lasix 40 mg daily has been on  hold.  11.Boniva once each month.  12.The patient had previously been on cyclobenzaprine, methotrexate,      Naprosyn, prednisone and Plaquenil.  She was told her hold her      lisinopril three days prior to admission to help improve her renal      function.   SOCIAL HISTORY:  The patient is divorced.  She has three children.  She  lives in [place], [place], with one of her daughter.  She does  not use tobacco.  She never has.  She drinks wine occasionally.  She is  currently unemployed.   FAMILY HISTORY:  The patient's mother is alive at age 42.  She has  arthritis and heart trouble.  Her father died in his 89s from heart  trouble.  She has a sister who is alive with asthma, a sister who died  in a scuba diving accident, a brother with gout and another brother with  hypertension.   HOSPITAL COURSE:  As noted, this patient was admitted to [facility] on July 06, 2006, for hydration and renal  consultation prior to endovascular treatment for cerebral aneurysms.  The patient was  hydrated with fluids.  She was seen in consultation by  Dr. Salem Senate.  Please see his dictated note for full details.  The  patient had been told to hold her Lasix, Lotensin and Naprosyn prior to  admission.  It was also recommended that she be treated with Mucomyst  and a sodium bicarbonate drip to protect her kidneys prior to the  angiogram.   On July 07, 2006, the patient's creatinine had improved from 1.55 to  1.18.  The decision was made to proceed with the cerebral angiogram and  possible coiling.  The patient did undergo coiling of the two anterior  communicating artery aneurysms on July 07, 2006, under general  anesthesia performed by Dr. Estanislado Pandy.  There were no known or immediate  complications noted.  The patient was taken to the neurology PACU and  then admitted to the neurology intensive care unit 3100 for close  monitoring.  She was maintained on IV heparin overnight.  The following  day, the heparin was discontinued.  Her right femoral groin sheath was  removed and she was placed on six hours of bedrest.  Nephrologist had  recommended a renal ultrasound.  That study is to be performed this  afternoon and is currently pending.   The patient had an abnormal chest x-ray with a possible nodule noted on  her chest x-ray prior to her recent admission.  A follow-up CT scan was  recommended.  The CT scan was performed on July 06, 2006.  This did  confirm a nodule in the lingula which could reflect metastasis or an  atypical primary tumor.  Consideration was made for a PET CT or a follow-  up CT in three months.  This was discussed with Dr. Estanislado Pandy and the  decision was made to bring the patient back in three months for further  aneurysm coiling and to repeat her CT scan to further evaluate her  pulmonary nodule at that time.   The patient was noted to have a low potassium on the day of discharge.  This was supplemented.  She was also noted to be anemic with  a hemoglobin of 8.2, hematocrit 23.3 on the day of discharge.  This was  felt secondary to fluid hydration.  On July 04, 2006, hemoglobin had  been 11.3,  hematocrit 33.2.  On July 07, 2006, hemoglobin was 10.5,  hematocrit 30.4.  On the day of discharge, BUN was 9, creatinine was  1.17.  Her GFR was 57.  Calcium was low at 7.1, potassium was low at  2.9, glucose was 104.   DISCHARGE MEDICATIONS:  1. The patient was told to resume the medications she was on prior to      admission, although she was asked to hold her lisinopril until      Sunday following her Friday discharge.  She was told to avoid      Naprosyn until seen by her primary care physician, Dr. Humphrey Rolls.  She      was to continue to hold her Lasix until seen by Dr. Humphrey Rolls.  She was      to continue Plavix for two more days and then stop this medication.      Her other medications included:  2. Colchicine 0.6 mg daily.  3. Glyburide 1.25 mg daily.  4. Jenuvia 100 mg daily.  5. Atenolol 50 mg daily.  6. Prilosec 40 mg daily.  7. Zocor 40 mg daily.  8. Allopurinol 300 mg b.i.d.  9. Boniva once each month.  10.Aspirin 81 mg daily indefinitely.   DIET:  The patient was told to stay on a diabetic diet.   She was given instructions regarding wound care.   ACTIVITY:  The patient was told to avoid any strenuous activity for two  weeks.   FOLLOW UP:  The patient is to have a follow-up angiogram in three months  with possible coiling of her third and remaining aneurysm.  She was told  to see her primary care physician, Dr. Lamonte Sakai, on Monday or Tuesday  of this coming week to have a basic metabolic panel as well as a CBC  blood test and to discuss resuming her Naprosyn and possibly her Lasix.   The patient will see Dr. Estanislado Pandy in two weeks.  The office will call  to schedule this appointment.  She will follow up with the renal group  as instructed or as needed.  She will have a follow-up CT scan in three  months when  she returns for her cerebral angiogram to further evaluate  her pulmonary nodule.   IMPRESSION:  1. Three known cerebral aneurysms.  2. Coiling of two anterior communicating artery aneurysms performed      July 07, 2006, by Dr. Estanislado Pandy under general anesthesia.  3. Plans to coil and/or stent the right middle cerebral artery      trifurcation aneurysm in approximately three months.  4. Nodule noted on chest x-ray and confirmed by CT scan with follow-up      CT scan to be performed in three months.  5. Anemia this admission with follow-up recommended with her primary      care physician early next week.  6. Hypokalemia supplemented with follow-up recommended with her      primary care physician Monday or Tuesday of next week.  7. Renal insufficiency with known solitary kidney and renal consult      this admission. Lisinopril, Naprosyn and Lasix held.  8. Diabetes mellitus.  9. History of hypertension.  10.History of lupus.  11.Arthritis with prior methotrexate therapy. 12.Cervical spine surgery June 2007.  13.Bilateral cataract repair.  14.Bartholin's cyst surgery.  15.History of gout.  16.History of cardiac murmur with instructions to receive antibiotics      before any interventions.      Mikey Bussing,  P.A.    ______________________________  Fritz Pickerel. Estanislado Pandy, M.D.    DR/MEDQ  D:  07/08/2006  T:  07/08/2006  Job:  [id number]   cc:   Kem Boroughs, M.D.  Elzie Rings Lorrene Reid, M.D.

## 2010-11-06 NOTE — Discharge Summary (Signed)
NAMEHUDSYN, TRAW          ACCOUNT NO.:  [id number]   MEDICAL RECORD NO.:  [id number]          PATIENT TYPE:  INP   LOCATION:  3111                         FACILITY:  [facility]   PHYSICIAN:  Sanjeev K. Deveshwar, M.D.DATE OF BIRTH:  Jun 28, 1945   DATE OF ADMISSION:  07/06/2006  DATE OF DISCHARGE:  07/09/2006                               DISCHARGE SUMMARY   ADDENDUM:   BRIEF HISTORY:  This is a very pleasant 65 year old female, who  underwent coiling of 2 cerebral aneurysms on July 07, 2006.  Please  see the previous dictated discharge summary for full details.  The  patient was to be discharged on July 08, 2006; however, her  hemoglobin was noted to be low at 8.2, and a decision was made to keep  the patient for a transfusion.  She was transfused 1 unit of packed red  blood cells.  Her hemoglobin came up from 8.2 to 9.9 on the day of  discharge.  Her hematocrit was also 28.1.  WBCs were 9.6.  Platelets  were 187,000.   The patient did have problems with renal insufficiency during her stay.  On the day of discharge, her creatinine was beginning to creep up.  BUN  was 10.  Creatinine was 1.35.  Potassium was 3.9.  Her GFR was 40 for a  non-African-American.   The patient has a history of lupus and arthritis.  She had been on  methotrexate, Naprosyn, and prednisone in the past for her arthritis  pain.  She also has a history of gout and had been on Colchicine and  Allopurinol.  These medications were held prior to admission.  On the  day of discharge, the patient was having pain in her knees along with  some swelling.  A decision was made to resume prednisone at 20 mg today  with the gradual taper of 15 mg tomorrow, 10 mg the following day, and  then 5 mg daily.  She was told to follow up with her primary care  physician, Dr. Lamonte Sakai, on Monday or Tuesday.  She was also given a  prescription for Vicodin 5/325, #30, to be taken 1 q.4-6h. p.r.n. pain,  no refills.   Please see previous dictation for other details.  We will  also contact Dr. Lorrene Reid on Monday to arrange further followup with  renal.   The patient did have a renal ultrasound performed on July 08, 2006,  that showed no evidence for hydronephrosis.  She did have increased  cortical echogenicity, suggesting medical renal disease.  There was a  right renal cyst with mild, nonspecific perinephric fluid with an upper  pole renal stone.   The patient does have a lung nodule that was followed by CT scan this  admission.  A followup CT scan has been recommended in 3 months, and  this will be performed when she returns for further intervention  regarding her remaining cerebral aneurysm.      Mikey Bussing, P.A.    ______________________________  Fritz Pickerel. Estanislado Pandy, M.D.    DR/MEDQ  D:  07/09/2006  T:  07/09/2006  Job:  [id number]   cc:  Neelam Bliss Lorrene Reid, M.D.  Leeroy Cha, M.D.

## 2010-11-06 NOTE — Discharge Summary (Signed)
NAMELENOLA, CAPITANO          ACCOUNT NO.:  [id number]   MEDICAL RECORD NO.:  [id number]          PATIENT TYPE:  OIB   LOCATION:  3112                         FACILITY:  [facility]   PHYSICIAN:  Sanjeev K. Deveshwar, M.D.DATE OF BIRTH:  [birth year]   DATE OF ADMISSION:  09/26/2006  DATE OF DISCHARGE:  09/27/2006                               DISCHARGE SUMMARY   CHIEF COMPLAINT:  Cerebral aneurysm.   HISTORY OF PRESENT ILLNESS:  This is a very pleasant 65 year old female  who was referred to Dr. Estanislado Pandy through the courtesy of Dr. Lamonte Sakai after the patient was found to have cerebral aneurysms by an  MRI/MRA performed secondary to a history of headaches.  On July 07, 2006 the patient underwent coiling of two anterior communicating  arteries performed by Dr. Estanislado Pandy under general anesthesia.  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  She returned to [facility] on September 26, 2006, to undergo coiling of the third and final aneurysm.   PAST MEDICAL HISTORY:  Significant for:  1. Renal insufficiency.  She has a solitary kidney.  She was seen in      consultation by Dr. Salem Senate and Dr. Jamal Maes during her      previous admission due to her renal status.  This has since      improved.  2. History of anemia which did require transfusion during her last      hospital stay.  3. She has a nodule on her chest x-ray that was also confirmed by CT      scan.  This is currently being followed by Dr. 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  4. Diabetes mellitus.  5. Hypertension.  6. History of cardiac murmur with a 2-D echocardiogram in 2007.  We      have not seen the results of this study.  She does require      antibiotic therapy for SBE prophylaxis.  7. 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  8. 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  9. She had hypokalemia during her last admission.   ALLERGIES:  She is allergic to SULFA.  She also developed a cough while  on PLAVIX.   PAST SURGICAL HISTORY:  Significant for cervical spine surgery,  bilateral cataract surgery and a history of a Bartholin's cyst  resection.   MEDICATIONS AT TIME OF ADMISSION:  1. Januvia 100 mg each morning.  2. Plaquenil 200 mg twice daily.  3. Allopurinol 300 mg daily.  4. Colchicine 0.6 mg daily.  5. Amlodipine 5 mg daily.  6. Plavix 75 mg daily.  7. Simvastatin 40 mg daily.  8. Aspirin 81 mg daily.  9. Prednisone 10 mg daily.   SOCIAL HISTORY:  The patient is divorced.  She has three children.  She  lives in [place] with one of her daughters.  She does not use  tobacco;  she never has.  She drinks wine occasionally.  She is currently  unemployed.   FAMILY HISTORY:  Her mother is alive at age 11.  She has arthritis and  heart trouble.  Her father died in his 66s from heart trouble.  She has  a sister who is alive with asthma and a sister who died in a scuba  diving accident.  She also has a brother with gout and another brother  with hypertension.   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  She had previously undergone  coiling of two anterior communicating artery aneurysms on July 07, 2006.  The patient had been started on Plavix in anticipation of this  procedure   Dr. Estanislado Pandy performed a cerebral angiogram on the morning of  admission.  He then proceeded with coiling of the aneurysm which was  performed under general anesthesia.  The patient tolerated this well.  There were no immediate or known complications.   The patient was admitted to the neuro intensive care unit where she was  monitored closely.  She was maintained on IV heparin overnight.   The  following day, the heparin was discontinued.  The right femoral groin  sheath was removed.  She was noted to be mildly hypokalemic which was  supplemented.  She was mildly anemic and felt slightly dizzy following  her bedrest.  She was given a 250-mL bolus of normal saline and her  symptoms improved.  The patient was discharged later that evening in  stable and improved condition.   LABORATORY DATA:  A basic metabolic panel on the a.m. of discharge  revealed a BUN of 4, creatinine 0.88, GFR was greater than 60, potassium  was low at 3.2 - this was supplemented, glucose was 143.  A CBC on the  day of discharge revealed hemoglobin 10.9; hematocrit 31.4; wbc's  10,300; platelets 175,000.  The anemia was felt to be in part due to  fluid hydration.  On April 2 her hemoglobin had been 11.9; hematocrit  35.2; wbc's 5800; platelets 208,000.  A chemistry profile on April 2 had  revealed a potassium of 3.5, glucose of 121, GFR was 56.   DISCHARGE INSTRUCTIONS:  1. The patient was told to resume all of her home medications as      listed above.  2. She was told to stop her Plavix.  3. She was to take aspirin 81 mg daily for 1 week only.  4. She requested a prescription for pain medication.  She was given      Darvocet-N 100 to be taken one to two q.4-6h. p.r.n., #30, no      refills.  5. The patient was told to stay on a low-sodium diabetic diet.  6. She was not to drive for 2 weeks.  She was not to do anything      strenuous for 2 weeks.  7. She is to have a repeat cerebral angiogram in 3 months.  8. The patient was told to follow up with her primary care physician,      Dr. Humphrey Rolls, in 1-2 weeks to have a repeat BUN, creatinine and      potassium level drawn.  She was to follow up with Dr. Estanislado Pandy      Monday, April 21, at 3 p.m.   PROBLEM LIST AT THE TIME OF DISCHARGE:  1. Status post coiling of a right middle cerebral artery trifurcation      aneurysm performed September 26, 2006. 2.  Previous coiling of two anterior communicating artery aneurysms      performed July 07, 2006.  3. History of renal insufficiency.  4. History of a solitary kidney.  5. Hypokalemia, supplemented.  6. Mild anemia.  7. History of a pulmonary nodule being followed by Dr. Melvyn Novas.  8. Diabetes mellitus.  9. Hypertension.  10.History of a cardiac murmur requiring subacute bacterial      endocarditis prophylaxis.  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  12.Allergy to SULFA.  13.Intolerance to PLAVIX due to a cough.  14.Status post multiple surgeries.      Mikey Bussing, P.A.    ______________________________  Fritz Pickerel. Estanislado Pandy, M.D.    DR/MEDQ  D:  09/29/2006  T:  09/29/2006  Job:  [id number]   cc:   Leeroy Cha, M.D.  Neelam Marcello Fennel F. Hassell Done, M.D.  Christena Deem. Melvyn Novas, MD, FCCP

## 2010-11-06 NOTE — Assessment & Plan Note (Signed)
[facility]                             PULMONARY OFFICE NOTE   NAME:[name], [name]                 MRN:          [id number]  DATE:09/21/2006                            DOB:          [birth year]    PULMONARY/FINAL FOLLOWUP OFFICE VISIT   HISTORY:  This is a 65 year old white female, never smoker,  with  rheumatism and a pulmonary nodule that is not seen on plain chest x-ray  by CT scan dated July 06, 2006, which was done because of cough. The  patient has no more cough and actually feels fine and returns as  requested for PFTs. Her rheumatism is actually well-controlled as well  on her present complicated medical regimen, which is reviewed in  detailed and inventoried on the face sheet, dated September 21, 2006.   PHYSICAL EXAMINATION:  She is a pleasant, ambulatory white female in no  acute distress. She has stable vital signs.  HEENT: Is unremarkable. Oropharynx is clear.  LUNGS: Lung fields are clear bilaterally to auscultation and percussion  with no crackles on inspiration and no wheeze on expiration.  HEART: Regular rate and rhythm without murmur, gallop or rub.  ABDOMEN: Soft, benign.  EXTREMITIES: Warm without calf tenderness, cyanosis, clubbing or edema.   Hemoglobin saturation is 96% on room air.   Chest x-ray shows no evidence of definite interstitial lung disease nor  nodule.   PFTs from today indicate a diffusing capacity of 64%, not corrected for  hemoglobin, but otherwise are normal with no evidence of restriction.   IMPRESSION:  1. Solitary pulmonary nodule in never smoker, seen only on CT scan and      present in the lingula at 9 x 12 mm. The likelihood that this is      malignant is quite low, but not zero. I emphasized this to the      patient and recommended follow up in six months.  2. The decrease in diffusing capacity suggests the possibility that      she has rheumatoid lung disease, which is also the most likely  the      cause of her lung nodule, but given the fact that her rheumatologic      process is in such good shape, I would suspect that this problem      will either stabilize or get better over the next six months.   Certainly, if her diffusing capacity drops while her rheumatism  improves, then consideration would need to be entertained for the  possibility of other causes of interstitial lung disease such as adverse  effect from medications for rheumatism. Note, that she is no longer on  methotrexate  and at this point is on no agents that cause pulmonary fibrosis and  therefore I think this is unlikely scenario. Followup can therefore be  at six months, sooner if needed.     Christena Deem. Melvyn Novas, MD, [facility]  Electronically Signed    MBW/MedQ  DD: 09/21/2006  DT: 09/21/2006  Job #: [id number]   cc:   Lamonte Sakai

## 2010-11-06 NOTE — H&P (Signed)
NAMECARMALETA, ZEIN          ACCOUNT NO.:  [id number]   MEDICAL RECORD NO.:  [id number]          PATIENT TYPE:  AMB   LOCATION:  SDS                          FACILITY:  [facility]   PHYSICIAN:  Sanjeev K. Deveshwar, M.D.DATE OF BIRTH:  1945-08-27   DATE OF ADMISSION:  09/26/2006  DATE OF DISCHARGE:                              HISTORY & PHYSICAL   CHIEF COMPLAINT:  Cerebral aneurysms.   HISTORY OF PRESENT ILLNESS:  This is a very pleasant 65 year old female  who was referred to Dr. Estanislado Pandy through the courtesy of Dr. Lamonte Sakai after the patient was found to have cerebral aneurysms by MRI/MRA  performed secondary to a history of headaches.  On July 07, 2006, the  patient underwent coiling of two anterior communicating arteries  performed by Dr. Estanislado Pandy under general anesthesia.  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  She has been followed closely and returns today  for coiling of the third aneurysm.   PAST MEDICAL HISTORY:  1. Renal insufficiency.  She does have a solitary kidney.  She has      been seen in consultation by Dr. Hassell Done and Dr. Lorrene Reid in the past      prior to her previous coiling.  2. History of anemia which did require transfusion during her last      visit.  3. She has a nodule on her chest x-ray that is currently being      followed by Dr. 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  4. Diabetes mellitus.  5. Hypertension.  6. History of a cardiac murmur.  She had a 2-D echocardiogram in 2007.      We do not have this study.  She does require antibiotic therapy      prior to interventions.  7. 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  8. History of systemic lupus, possible rheumatoid arthritis, and gout.  9. She was hypokalemic during her last admission.   ALLERGIES:  SHE IS ALLERGIC TO SULFA.  SHE DEVELOPED A COUGH WHILE ON  PLAVIX.  SHE WAS PLACED ON PLAVIX THREE DAYS PRIOR TO THIS ADMISSION,  AND THE PATIENT STATES THAT SO FAR SHE HAS NOT DEVELOPED A RECURRENT  COUGH.   PAST SURGICAL HISTORY:  1. History of C-spine surgery.  2. Bilateral cataract surgery.  3. History of a Bartholin's cyst resection.   CURRENT MEDICATIONS:  1. Januvia 100 mg each morning.  2. Plaquenil 200 mg twice daily.  3. Allopurinol 300 mg daily.  4. Colchicine 0.6 mg daily.  5. Amlodipine 5 mg daily.  6. Plavix 75 mg daily.  7. Simvastatin 40 mg daily.  8. Aspirin 81 mg daily.  9. Prednisone 10 mg daily.   SOCIAL HISTORY:  The patient is divorced.  She has three children.  She  lives in [place] with one of her daughters.  She does not use  tobacco, she never has.  She drinks wine occasionally.  She is currently  unemployed.  FAMILY HISTORY:  Her mother is alive at age 64.  She has arthritis and  heart trouble.  Her father died in his 54s from heart trouble.  She has  a sister who is alive with asthma and a sister who died in a scuba  diving accident.  She also has a brother with gout and another brother  with hypertension.   REVIEW OF SYSTEMS:  Completely negative, except for some mild dyspnea on  exertion.  She denies any orthopnea.  She does have severe arthritis.  She reports that she bruises easily.  She has diabetes.  She has had  some recent lower extremity edema which she attributes to her  prednisone.   PHYSICAL EXAMINATION:  GENERAL:  Exam reveals a pleasant 65 year old  female in no acute distress.  VITAL SIGNS:  Blood pressure 140/89, pulse 76, respirations 20,  temperature 98.2, oxygen saturation 98% on room air.  HEENT:  Unremarkable.  NECK:  No bruits.  No jugular venous distension.  HEART:  Regular rate and rhythm without murmur.  LUNGS:  Clear.  The airway is rated at a 4.  Her ASA scale is a 4.  ABDOMEN:  Soft, nontender.  EXTREMITIES:  Exam reveals pulses to be intact with trace edema.   NEUROLOGIC:  Mental status:  The patient is alert and oriented and  follows commands.  Cranial nerves II-XII are grossly intact.  Sensation  is intact to light touch.  Motor strength is 5/5 throughout.  Cerebellar  testing is intact.   LABORATORY DATA:  INR is 1.0, PTT 28.  Hemoglobin 11.9, hematocrit 35.2,  WBC 5.8, platelets 208,000.  BUN 16, creatinine 1.01, potassium 3.5,  glucose 121.  GFR 56.   IMPRESSION:  1. 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  2. 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  3. History of a solitary kidney.  4. History of renal insufficiency, currently improved.  5. History of anemia requiring transfusion.  6. Pulmonary nodule, being followed by Dr. Melvyn Novas.  7. Diabetes mellitus.  8. Hypertension.  9. History of cardiac murmur which requires subacute bacterial      endocarditis prophylaxis according to the patient.  10.Systemic lupus, gout, and arthritis.  11.Previous history of hypokalemia.  12.Allergy to Sulfa.  13.Intolerance to Plavix secondary to a cough.  14.History of multiple surgeries, as noted above.   PLAN:  As noted, the patient will undergo a repeat cerebral angiogram  today with possible coiling and/or stenting of her third and final  remaining aneurysm to be performed under general anesthesia by Dr.  Estanislado Pandy if felt to be safe and indicated.      Mikey Bussing, P.A.    ______________________________  Fritz Pickerel. Estanislado Pandy, M.D.    DR/MEDQ  D:  09/26/2006  T:  09/26/2006  Job:  [id number]   cc:   Leeroy Cha, M.D.  Neelam Marcello Fennel F. Hassell Done, M.D.

## 2010-11-06 NOTE — Consult Note (Signed)
[name], [name]          ACCOUNT NO.:  [id number]   MEDICAL RECORD NO.:  [id number]          PATIENT TYPE:  OUT   LOCATION:  XRAY                         FACILITY:  [facility]   PHYSICIAN:  Sanjeev K. Deveshwar, M.D.DATE OF BIRTH:  1946-02-19   DATE OF CONSULTATION:  06/03/2006  DATE OF DISCHARGE:                                 CONSULTATION   CHIEF COMPLAINT:  Cerebral aneurysms.   HISTORY OF PRESENT ILLNESS:  This is a very pleasant 65 year old female  with a history of severe headaches.  She was evaluated by her primary  care physician, Dr. Lamonte Sakai, and had an MRI/MRA which was noted to  be abnormal, consistent with a possible aneurysm.  The patient had a  cerebral angiogram performed on May 25, 2006, by Dr. Estanislado Pandy that  did reveal two anterior communicating artery region aneurysms, one  measuring 6.2-mm x 4.5, the other 4.9 x 4.2-mm.  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  The patient presents today with her daughter and three  grandchildren to discuss further treatment options.   PAST MEDICAL HISTORY:  Is significant for:  1. Diabetes mellitus.  2. Hypertension.  3. She has a history of a cardiac murmur.  She had an echo earlier      this year.  We do not have the results of the echo but apparently      the patient has been instructed to receive antibiotic prophylaxis      therapy prior to any procedures or interventions.  She did receive      Ancef prior to her cerebral angiogram.  4. The patient has a solitary kidney which apparently is congenital.  5. She has a history of systemic lupus.  6. She has had some renal insufficiency.   Her initial appointment for her angiogram needed to be cancelled due to  a creatinine of 2.   SURGICAL HISTORY:  Significant for:  1. Cervical spine surgery, performed by Dr. Joya Salm  in June  2007.  2. She has had bilateral cataract surgery.  3. She has had surgery for a Bartholin's cyst.  She reports some      difficulty waking up after anesthesia.   ALLERGIES:  SHE IS ALLERGIC TO SULFA.  SHE DENIES ALLERGY TO CONTRAST DYE OR TO LATEX.   CURRENT MEDICATIONS:  1. Lisinopril 20 mg daily.  2. Cyclobenzaprine 10 mg daily.  3. Simvastatin 40 mg daily.  4. Methotrexate 6 tablets, 2.5 mg each, once every Friday.  5. Allopurinol 300 mg daily.  6. Naprosyn 500 mg daily.  7. Lasix 40 mg daily.  8. Atenolol 50 mg daily.  9. Prednisone 5 mg daily.  10.Hydroxychloroquine which is Plaquenil 200 mg daily.  11.Januvia 100 mg daily.   SOCIAL HISTORY:  The patient is divorced.  She has three children.  She  lives in [place] with one of her daughters.  She does not use  tobacco.  She never has.  She drinks wine occasionally.  She is  currently unemployed.   FAMILY HISTORY:  Her mother is alive at age 29.  She has arthritis and  heart trouble.  Her father died in his 3s from heart trouble.  She has  a sister alive with asthma.  A sister who died in an accident.  A  brother with gout and another brother with hypertension.   IMPRESSION AND PLAN:  As noted, the patient presents today with her  daughter and three grandchildren to discuss treatment options for her  cerebral aneurysms.  Dr. Estanislado Pandy reviewed the results of her recent  angiogram with the patient and her family.  He pointed out the three  aneurysms.  He discussed treatment options including continued  monitoring, versus open surgery, versus endovascular stenting and/or  coiling. The procedures were described in detail along with the risks  and benefits.  All of the patient's questions were addressed.  The  patient would like to proceed with scheduling of the interventions.  Dr.  Estanislado Pandy the two anterior communicating artery aneurysms could be  treated at one setting with possible stenting and/or coiling.  He felt   that she would need to return several months later to have the right  middle cerebral artery aneurysm treated.  Apparently, the patient is  also due to have some back surgery at some point.  He felt that this  could possibly be performed in the interim between the two aneurysm  treatments.  We will schedule the patient for the first intervention at  the earliest available appointment per her request.  She will need a  prescription for Plavix to be started 3 days prior to the intervention.   Greater than 40 minutes was spent on this consult.      Mikey Bussing, P.A.    ______________________________  Fritz Pickerel. Estanislado Pandy, M.D.    DR/MEDQ  D:  06/03/2006  T:  06/03/2006  Job:  [id number]   cc:   Kem Boroughs, M.D.

## 2010-11-06 NOTE — Op Note (Signed)
NAMELAGUANA, FERRALL          ACCOUNT NO.:  [id number]   MEDICAL RECORD NO.:  [id number]          PATIENT TYPE:  INP   LOCATION:  2899                         FACILITY:  [facility]   PHYSICIAN:  Leeroy Cha, M.D.   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   PROCEDURE:  Decompression of the spinal cord at the level of C5-C6 and C6-  C7, foraminotomy, intervertebral fusion with allograft and autograft, plate,  and microscope.   SURGEON:  Leeroy Cha, M.D.   ASSISTANT:  Hosie Spangle, M.D.   CLINICAL HISTORY:  The patient was admitted because of neck pain with  radiation to both upper extremities and weakness.  X-ray showed that she has  stenosis at the level of C5-C6 and C6-C7.  Surgery was advised.  The risks  were explained during the history and physical.   PROCEDURE:  The patient was taken to the OR and after intubation, the left  side of the neck was prepped.  A transverse incision was made through the  skin, subcutaneous tissue, platysma, down to the cervical spine.  X-ray  showed that we were at the level of 5-6.  The x-ray was difficult to read,  but the radiologist agreed that we were at the level of 5-6.  We brought the  microscope into the area and the anterior ligament of 5-6 and 6-7 were  opened.  With a curet as well as the drill, we removed the disc at both  levels with Kerrisons.  The posterior ligament was opened and using the 1,  2, and 3 mm Kerrison punch, decompression of the spinal cord and  foraminotomy was accomplished.  Having good decompression with good  visualization of both C6 and C7 nerve root, the endplates were drilled and  two pieces  of allograft with autograft were inserted followed by a plate  using six screws.  X-rays showed good position of the graft and the plate.  From then on, the area was irrigated.  We waited five minutes to be sure  there was not any bleeding.  Having good hemostasis, the area was irrigated  again and the wound was closed with Vicryl and Steri-Strips.           ______________________________  Leeroy Cha, M.D.     EB/MEDQ  D:  06/25/2005  T:  06/26/2005  Job:  [id number]

## 2010-11-06 NOTE — Assessment & Plan Note (Signed)
[facility]                             PULMONARY OFFICE NOTE   NAME:[name], [name]                 MRN:          [id number]  DATE:08/05/2006                            DOB:          11-Apr-1946    REASON FOR CONSULTATION:  Lung nodule.   HISTORY:  This is a 65 year old white female who recently underwent  evaluation by Dr. Estanislado Pandy for cerebral aneurysms, and is presently  scheduled for coiling on April 17, but was found to have an incidental  pulmonary nodule and clearance was requested.  The patient states that  she has a longstanding history of rheumatism that is worse than ever  the last year despite being maintained on combination therapy with  methotrexate and hydroxychloroquine.  She was found to have an  incidental nodule, but denies any significant smoking history either  active or passive, pleuritic pain, fevers, chills, sweats, dyspnea or  history of hemoptysis or cough.   PAST MEDICAL HISTORY:  Significant for  1. Rheumatism (not otherwise specified, but says she might have      lupus).  2. Hypertension.  3. Hyperlipidemia.  4. Diabetes.  5. Previous neck and back surgery.   ALLERGIES:  SULFA AND PLAVIX.   MEDICATIONS:  Boniva, Naprosyn, methotrexate, folic acid, TriCor,  aspirin, allopurinol, Zocor, hydroxychloroquine and cyclobenzaprine.  For full dosing, please see medication face sheet column dated August 05, 2006.   SOCIAL HISTORY:  She has never smoked.  She is retired with no unusual  travel, pet or work exposure history.   FAMILY HISTORY:  Negative for cancer in any direct relatives.  Positive  for asthma in her mother.   REVIEW OF SYSTEMS:  Taken in detail on the work sheet, and negative  except as outlined above.   PHYSICAL EXAMINATION:  This is a slightly anxious but quite pleasant  ambulatory female in no acute distress.  She is afebrile, stable vital signs.  HEENT:  Unremarkable.  Oropharynx is  clear.  NECK:  Supple without cervical adenopathy or tenderness.  The trachea  was midline, no thyromegaly.  Lung fields are completely clear bilaterally to auscultation and  percussion with no cough on inspiratory or expiratory maneuvers.  There is a regular rhythm without murmur, gallop or rub present.  ABDOMEN:  Soft, benign.  EXTREMITIES:  Warm without calf tenderness, cyanosis, clubbing or edema.  No obvious joint deformities or extensor nodules.   Chest x-ray is reviewed from a year ago and then compared to the study  done in January, and shows a definite new nodular density in the  lingula, which on CT scan is 9 x 12 mm and well circumscribed,  noncalcified.   IMPRESSION:  Nonspecific nodule in the lingula in this patient with the  worst rheumatism of her life is certainly suggestive of a rheumatoid  nodule, if in fact the patient carries a diagnosis of rheumatoid  arthritis.  However, I note she has no extensor nodules on exam.  She  also has no significant pleural effusion, which frequently is associated  with nodular lung disease from rheumatoid arthritis.   Nevertheless, I think  it is very unlikely this represents malignancy in  this patient who has no risk factors for cancer, and recommended a  conservative followup here in the office in 6 weeks with PFTs and repeat  cxr.  PET scanning is unlikely to distinguish between rheumatoid nodules  and malignancy, and this lesion is at the lower limit of PET scan  detection anyway.   There is no problem at all, however, clearing her for surgery which  was the stated request on the form.   When she returns if she has any old x-rays from the [facility] I  would be happy to look at them, although note that her baseline chest x-  ray available in the [facility] from June 23, 2005, shows no nodule  in the same area.  Followup in 6 to 8 weeks. I would be very interested  in Dr Scharlene Gloss opinion re: what kind of rheumatism  she has and whether  it might help the nodule, if it is inflammatory, to treat the rheumatism  more aggressively in the short run, for example with steroids, after the  aneursym is eliminated to Dr Buckner Malta satisfaction.     Christena Deem. Melvyn Novas, MD, [facility]  Electronically Signed    MBW/MedQ  DD: 08/05/2006  DT: 08/06/2006  Job #: [id number]   cc:   Precious Reel, MD  Lamonte Sakai

## 2010-11-06 NOTE — Consult Note (Signed)
NAMESIBLEY, GOTTSCHALL          ACCOUNT NO.:  [id number]   MEDICAL RECORD NO.:  [id number]          PATIENT TYPE:  OUT   LOCATION:  XRAY                         FACILITY:  [facility]   PHYSICIAN:  Sanjeev K. Deveshwar, M.D.DATE OF BIRTH:  09-Jan-1946   DATE OF CONSULTATION:  10/10/2006  DATE OF DISCHARGE:                                 CONSULTATION   REFERRING PHYSICIAN:  Lamonte Sakai, M.D.   CHIEF COMPLAINT:  Cerebral aneurysms.   HISTORY OF PRESENT ILLNESS:  This is a very pleasant, 65 year old female  referred to Dr. Estanislado Pandy through the courtesy of Dr. Lamonte Sakai for  evaluation of aneurysms.  The patient had cerebral angiograms performed  which did confirm the presence of three aneurysms.  On July 07, 2006,  she underwent coiling of two anterior communicating artery aneurysms  without immediate or known complications.  She returned on September 26, 2006, for coiling of a right middle cerebral artery aneurysm.  She  returns today approximately 2 weeks later to be seen in followup.   PAST MEDICAL HISTORY:  1. Renal insufficiency.  She has a solitary kidney.  She has been      followed by Dr. Salem Senate and Dr. Jamal Maes during previous      admissions for her renal status.  2. History of anemia which did require transfusion following her first      aneurysm coiling.  3. Nodule on chest x-ray confirmed by CT scan.  She was referred to      Dr. Christinia Gully who is following her for this problem.  He feels      it is most likely a rheumatoid nodule, although malignancy has not      been ruled out.  4. Diabetes.  5. Hypertension.  6. Cardiac murmur by 2-D echocardiogram in 2007, and requires SBE      prophylaxis.  7. Congenital solitary kidney.  8. Systemic lupus.  9. Rheumatoid arthritis.  10.Gout.  11.Previous hypokalemia during her hospital stays.   ALLERGIES:  SULFA.  She is intolerant to PLAVIX due to a cough, however,  she has been treated with Plavix without  significant problems.   PAST SURGICAL HISTORY:  1. Cervical spine surgery.  2. Bilateral cataract surgery.  3. History of the Bartholin's cyst resection.   MEDICATIONS:  1. Januvia.  2. Plaquenil.  3. Allopurinol.  4. Colchicine.  5. Amlodipine.  6. Previous Plavix which was stopped during her last admission.  7. Simvastatin.  8. Aspirin 81 mg daily.  9. Prednisone 10 mg daily.   SOCIAL HISTORY:  The patient is divorced.  She has three children.  She  lives in [place] with one of her daughters.  She does not use  tobacco.  She never has.  She drinks wine occasionally.  She is  currently unemployed.   FAMILY HISTORY:  Mother is alive at age 47.  She has arthritis and heart  trouble.  Her father died in his 21s from heart trouble.   IMPRESSION/RECOMMENDATIONS:  As noted, the patient returns today  approximately 2 weeks following her right, middle cerebral artery  aneurysm coiling.  She reports that she is doing well.  She denies any  headaches, any dizziness, weakness, numbness of the extremities or any  other neurological problems.  She states that she feels fine.  She drove  here today without difficulty.   As noted, her Plavix was discontinued at the time of her last hospital  discharge.  She was to discontinue aspirin 1 week later, however, she is  still taking aspirin 81 mg daily.  Dr. Estanislado Pandy recommended that she  change this to every other day for one more week and then discontinue.   Overall, the patient is doing well.  A repeat cerebral angiogram has  been recommended for September 2008.   Fifteen minutes was spent on this consult.      Mikey Bussing, P.A.    ______________________________  Fritz Pickerel. Estanislado Pandy, M.D.    DR/MEDQ  D:  10/10/2006  T:  10/11/2006  Job:  [id number]   cc:   Leeroy Cha, M.D.  Richard F. Hassell Done, M.D.  Christena Deem. Melvyn Novas, MD, [place]  Neelam Humphrey Rolls

## 2010-11-06 NOTE — H&P (Signed)
NAMEJULANNE, [name]          ACCOUNT NO.:  [id number]   MEDICAL RECORD NO.:  [id number]          PATIENT TYPE:  AMB   LOCATION:  SDS                          FACILITY:  [facility]   PHYSICIAN:  Sanjeev K. Deveshwar, M.D.DATE OF BIRTH:  11/14/1945   DATE OF ADMISSION:  07/06/2006  DATE OF DISCHARGE:                              HISTORY & PHYSICAL   CHIEF COMPLAINT:  Cerebral aneurysm.   HISTORY OF PRESENT ILLNESS:  This is a very pleasant 65 year old female  with a history of severe headaches.  She was evaluated by her primary  care physician, Dr. Lamonte Sakai, and had an MRI/MRA which was consistent  with a possible aneurysm. A cerebral angiogram was scheduled; however,  it could not be performed due to a creatinine of 2. The patient was  hydrated and brought back on December 5 at which time her creatinine was  1.2. The cerebral angiogram did reveal two anterior communicating artery  region aneurysms, one measuring 6.2 mm x 4.5, the other 4.9 x 4.2 mm.  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 The patient was seen back in consultation on June 03, 2006, and arrangements were made to have the patient return to [facility] on July 04, 2006, for treatment of the aneurysms.  Unfortunately, her creatinine was elevated at 1.5, and Dr. Estanislado Pandy did  not feel comfortable proceeding as he would need to use a significant  amount of dye for the intervention.  The decision has been made to have  the patient return to [facility] on January 16 for hydration  and then to have her procedure on January 17. A renal consult will be  obtained on January 16.  This has already been cleared with Dr. Jamal Maes.   [place]:  Significant for:  1. Diabetes mellitus.  2. Hypertension.  3. She has a history of cardiac murmur.  She had a 2-D  echocardiogram      performed in 2007. We do not have the results, although the patient      was instructed to receive antibiotic prophylaxis prior to any      interventions.  4. 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  5. She has history systemic lupus.  6. She has had renal insufficiency in the past.  Apparently she has      never had a full renal consult, and we have contacted Dr. Perrin Maltese office for records regarding her renal status.   SURGICAL HISTORY:  1. Significant for cervical spine surgery performed by Dr. Joya Salm June      2007.  2. She is also had bilateral cataract surgery.  3. She had surgery for Bartholin's cyst.   She reports some difficulty waking up after anesthesia.   ALLERGIES:  She is allergic to SULFA.   CURRENT MEDICATIONS:  1. Lisinopril 20 mg daily.  2. Glyburide 1.25 mg daily.  3. Januvia  100 mg daily.  4. Atenolol 50 mg daily.  5. Prilosec 40 mg daily.  6. Zocor 40 mg daily.  7. Colchicine 0.6 mg daily.  8. Allopurinol 300 mg b.i.d.  9. Plavix has been started in anticipation of possible stenting, 75 mg      daily.  10.Lasix 40 mg daily which has been on hold.  11.Boniva once each month.  12.The patient also had previously been on cyclobenzaprine,      methotrexate, Naprosyn, prednisone and Plaquenil.   SOCIAL HISTORY:  The patient is divorced.  She has three children.  She  lives in [place] with one of her daughters.  She does not use  tobacco; she never has.  She drinks wine occasionally.  She is currently  unemployed.   FAMILY HISTORY:  Mother is alive at age 68.  She has arthritis and heart  trouble.  Her father died in his 19s from heart trouble.  She has a  sister who is alive with asthma, sister who died in an accident, a  brother with gout, another brother with hypertension.   REVIEW OF SYSTEMS:  The patient had a recent upper respiratory tract  infection which is currently resolving. She does have  arthritis,  especially affecting her hands. The remainder of Review of Systems is  negative except for as noted in the Past Medical History and HPI   PHYSICAL EXAMINATION:  GENERAL:  Reveals a very pleasant 65 year old  female in no acute distress.  VITAL SIGNS: Blood pressure 154/87, pulse 93, respirations 16,  temperature 97.9, oxygen saturation 96%.  HEENT:  Unremarkable.  Neck: Revealed no bruits.  HEART: Revealed regular rate and rhythm without murmur.  LUNGS:  Clear.  ABDOMEN: Soft, nontender.  EXTREMITIES: Extremities reveal pulses intact without edema.  Her airway was rated at a 4; her ASA scale is rated at a 3.  NEUROLOGIC: Mental status: The patient is alert and oriented, follows  commands.  Cranial nerves II-XII are grossly intact.  Sensation is  intact to light touch.  Motor strength is 5/5 throughout.  Cerebellar  testing is intact.   LABORATORY DATA:  A CBC performed on the January 14 revealed hemoglobin  11.3, hematocrit 33.2, WBC 7600, platelets 307,000.  A chemistry profile  today revealed a BUN of 31, creatinine 1.55. GFR was 34. Potassium 3.7,  sodium 143, glucose 97. INR is 1.0.  PTT is 29.   Chest x-ray reportedly showed a possible pulmonary nodule.  I do not  have the official report at this time.   IMPRESSION:  1. Multiple cerebral aneurysms for coiling and/or possible stenting.  2. Renal insufficiency.  3. Diabetes mellitus.  4. Hypertension.  5. History of a cardiac murmur.  6. Congenital solitary kidney.  7. Systemic lupus  8. Status post multiple surgeries.  9. Mild anemia.  10.Question of a lung nodule by chest x-ray with CT scan recommended.   IMPRESSION:  As noted, the patient was scheduled to have her aneurysm  treated today; however, due to a creatinine of 1.5, decision was made to  ask the patient to return Wednesday for fluid hydration and then to have her procedure done on Thursday.  We did discuss this case over the phone  with Dr.  Jamal Maes today who made some recommendations such as  holding the patient's Lotensin, any diuretics, and treating with  Mucomyst the day before the intervention.   PLAN:  1. The patient will return Wednesday, January 16.  2. She will receive IV fluid hydration.  3. We will hold any nephrotoxic medications.  4. We will treat her with Mucomyst.  5. We will obtain a renal consult.  6. We will order a CT scan without contrast to follow up on the      questionable pulmonary nodule.  7. We have also contacted Dr. Perrin Maltese office for further records      regarding the patient's renal status.      Mikey Bussing, P.A.    ______________________________  Fritz Pickerel. Estanislado Pandy, M.D.    DR/MEDQ  D:  07/04/2006  T:  07/04/2006  Job:  [id number]   cc:   Kem Boroughs, M.D.  Elzie Rings Lorrene Reid, M.D.

## 2010-11-06 NOTE — Consult Note (Signed)
NAMEYOSELIN, PEED          ACCOUNT NO.:  [id number]   MEDICAL RECORD NO.:  [id number]          PATIENT TYPE:  INP   LOCATION:  3003                         FACILITY:  [facility]   PHYSICIAN:  Maudie Flakes. Hassell Done, M.D.   DATE OF BIRTH:  [birth year]   DATE OF CONSULTATION:  DATE OF DISCHARGE:                                 CONSULTATION   [name] is a very nice 65 year old woman who was admitted for  cerebral angio to clip/stent aneurysms that were noted on prior cerebral  angiogram.  She had headaches.  A CT scan revealed cerebral aneurysms.  She underwent angiography by  Dr. Estanislado Pandy last month demonstrating several aneurysms. Her baseline  creatinine is 1.52.  She was reportedly born with only one kidney.  Renal consult was requested because of her risk of contrast nephropathy.  She has not taken lisinopril in five days.  Her primary care physician  is Dr. Lamonte Sakai [facility]).   PAST MEDICAL HISTORY:  1. Hypertension greater than 20 years.  2. Diabetes mellitus 2 years.  3. Solitary kidney (by history).  4. SLE (by history).  5. Arthritis (prior methotrexate).  6. C-spine surgery June 2007 (Dr. Joya Salm).  7. Bilateral cataract repair.  8. Bartholin cyst surgery.  9. Cerebral aneurysms (detected last month).  10.Gout.   MEDICATIONS PRIOR TO ADMISSION:  1. Lisinopril 20 daily (on hold).  2. Colchicine 0.6,  on hold.   1. Glyburide 1.25 daily, on hold.  2. Januvia 100 daily.  3. Atenolol 50 daily.  4. Prilosec 40 daily.  5. Zocor 40 daily.  6. Allopurinol 300 daily.  7. Plavix 75 daily.  8. Lasix 40 daily (on hold)  9. Boniva once a month.   SOCIAL HISTORY:  She was born in [place]. She is divorced, lives with her  daughter in [place], [place].  She is a Programmer, systems.  She attended [employer] for two years.  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  She  does smoke cigarettes.  She drinks  alcohol about once a year.   FAMILY HISTORY:  Parents and grandparents are from [place], [place], and  [place].  She has two brothers and one sister who are well.  One sister  died in a scuba diving accident.  Her father died of an MI.  Mother has  heart trouble.  Her children (three daughters) are well.   REVIEW OF SYSTEMS:  No angina, no claudication, no melena, no  hematochezia, no gross hematuria.  No renal colic.  No cold or heat  intolerance.  No loss of consciousness.  No seizures.  She does have  occasional pretibial edema.  No DOE, no orthopnea.  She does have  headaches (that was the reason for the discovery of a cerebral  aneurysms).   PHYSICAL EXAMINATION:  GENERAL:  She is awake, alert, cooperative,  friendly.  VITAL SIGNS:  Temperature 98, pulse 86, respirations 20, blood pressure  149/86.  NECK:  Scar left anterior neck from prior C-spine surgery.  No bruits  are heard.  CHEST:  Clear.  HEART:  No rub.  ABDOMEN:  Nontender.  No organs or masses are felt.  EXTREMITIES:  No edema.  No atheroembolic changes on toes.  NEURO:  Right-handed.  Strength equal.  Sensation intact.   LABORATORY DATA:  Hemoglobin 11.3, WBC 7600, PLT  307.  Sodium 143,  potassium 3.7, chloride 110, CO2 27, BUN 31, creatinine 1.55, calcium  9.1.  PT 13.2, PTT 29.   IMPRESSION:  1. Hypertension.  2. DM  3. Solitary kidney by history.  4. CKV.  5. Cerebral aneurysm(s).  6. C-spine surgery (last June).  7. SLE (by history).  8. arthritis   PLAN:  1. Continue to hold Lisinopril until one to two days post cath.  2. Sliding scale insulin.  3. Renal ultrasound this admission.  4. IV normal saline tonight. Mucomyst today and tomorrow.  Prior to      cath begin sodium bicarb drip and continue this for 12 hours post      contrast.  Check creatinine post angio.  Check urinalysis.  5. Per Dr. Clinton Sawyer.  6. No suggestions.  7. No suggestions.  8. Avoid NSAIDs            ______________________________  Maudie Flakes. Hassell Done, M.D.     RFF/MEDQ  D:  07/06/2006  T:  07/07/2006  Job:  [id number]

## 2010-11-24 NOTE — Discharge Summary (Signed)
  NAMETOMMIA, CAVINESS          ACCOUNT NO.:  [id number]  MEDICAL RECORD NO.:  [id number]           PATIENT TYPE:  O  LOCATION:  XRAY                         FACILITY:  [facility]  PHYSICIAN:  Bruce Mayers K. Aubrina Nieman, M.D.DATE OF BIRTH:  04-30-1946  DATE OF ADMISSION:  09/17/2010 DATE OF DISCHARGE:  09/17/2010                              DISCHARGE SUMMARY   ADDENDUM  This is in regard to her discharge summary, the job number is [id number]. It was written in my access anywhere that it needed a final diagnosis. So the final diagnosis for [facility] is right middle cerebral artery stent placement.  Any further problems, please let me know.     Lesleigh Noe Nonie Hoyer, P.A.   ______________________________ Fritz Pickerel Estanislado Pandy, M.D.    PAT/MEDQ  D:  10/28/2010  T:  10/29/2010  Job:  [id number]  Electronically Signed by Monia Sabal P.A. on 11/24/2010 09:23:10 AM Electronically Signed by Luanne Bras M.D. on 11/24/2010 03:04:57 PM

## 2011-03-02 ENCOUNTER — Other Ambulatory Visit (HOSPITAL_COMMUNITY): Payer: Self-pay | Admitting: Internal Medicine

## 2011-03-02 DIAGNOSIS — I729 Aneurysm of unspecified site: Secondary | ICD-10-CM

## 2011-03-02 DIAGNOSIS — H35049 Retinal micro-aneurysms, unspecified, unspecified eye: Secondary | ICD-10-CM

## 2011-03-08 ENCOUNTER — Other Ambulatory Visit (HOSPITAL_COMMUNITY): Payer: Self-pay | Admitting: Interventional Radiology

## 2011-03-08 DIAGNOSIS — I729 Aneurysm of unspecified site: Secondary | ICD-10-CM

## 2011-03-08 DIAGNOSIS — H35049 Retinal micro-aneurysms, unspecified, unspecified eye: Secondary | ICD-10-CM

## 2011-03-10 ENCOUNTER — Ambulatory Visit (HOSPITAL_COMMUNITY)
Admission: RE | Admit: 2011-03-10 | Discharge: 2011-03-10 | Disposition: A | Discharge status: Home / Self Care | Payer: PRIVATE HEALTH INSURANCE | Source: Ambulatory Visit | Attending: Internal Medicine | Admitting: Internal Medicine

## 2011-03-10 ENCOUNTER — Other Ambulatory Visit (HOSPITAL_COMMUNITY): Payer: Self-pay | Admitting: Interventional Radiology

## 2011-03-10 DIAGNOSIS — I671 Cerebral aneurysm, nonruptured: Secondary | ICD-10-CM | POA: Insufficient documentation

## 2011-03-10 DIAGNOSIS — H35049 Retinal micro-aneurysms, unspecified, unspecified eye: Secondary | ICD-10-CM

## 2011-03-10 DIAGNOSIS — I729 Aneurysm of unspecified site: Secondary | ICD-10-CM

## 2011-03-10 DIAGNOSIS — R51 Headache: Secondary | ICD-10-CM | POA: Insufficient documentation

## 2011-03-15 LAB — CBC
HCT: 37.2
Hemoglobin: 12.7
MCHC: 34
MCV: 92.8
Platelets: 162
RBC: 4.01
RDW: 13.6
WBC: 6

## 2011-03-15 LAB — PROTIME-INR
INR: 0.8
Prothrombin Time: 11.4 — ABNORMAL LOW

## 2011-03-15 LAB — BASIC METABOLIC PANEL
BUN: 25 — ABNORMAL HIGH
BUN: 28 — ABNORMAL HIGH
CO2: 26
CO2: 28
Calcium: 9.2
Calcium: 9.9
Chloride: 109
Chloride: 111
Creatinine, Ser: 1.16
Creatinine, Ser: 1.23 — ABNORMAL HIGH
GFR calc Af Amer: 54 — ABNORMAL LOW
GFR calc Af Amer: 58 — ABNORMAL LOW
GFR calc non Af Amer: 45 — ABNORMAL LOW
GFR calc non Af Amer: 48 — ABNORMAL LOW
Glucose, Bld: 136 — ABNORMAL HIGH
Glucose, Bld: 152 — ABNORMAL HIGH
Potassium: 3.6
Potassium: 3.7
Sodium: 145
Sodium: 145

## 2011-03-16 ENCOUNTER — Ambulatory Visit (HOSPITAL_COMMUNITY)
Admission: RE | Admit: 2011-03-16 | Discharge: 2011-03-16 | Disposition: A | Discharge status: Home / Self Care | Payer: PRIVATE HEALTH INSURANCE | Source: Ambulatory Visit | Attending: Internal Medicine | Admitting: Internal Medicine

## 2011-03-16 DIAGNOSIS — Z538 Procedure and treatment not carried out for other reasons: Secondary | ICD-10-CM | POA: Insufficient documentation

## 2011-03-16 DIAGNOSIS — M109 Gout, unspecified: Secondary | ICD-10-CM | POA: Insufficient documentation

## 2011-03-16 DIAGNOSIS — D649 Anemia, unspecified: Secondary | ICD-10-CM | POA: Insufficient documentation

## 2011-03-16 DIAGNOSIS — Z09 Encounter for follow-up examination after completed treatment for conditions other than malignant neoplasm: Secondary | ICD-10-CM | POA: Insufficient documentation

## 2011-03-16 DIAGNOSIS — I729 Aneurysm of unspecified site: Secondary | ICD-10-CM

## 2011-03-16 DIAGNOSIS — E040 Nontoxic diffuse goiter: Secondary | ICD-10-CM | POA: Insufficient documentation

## 2011-03-16 DIAGNOSIS — I671 Cerebral aneurysm, nonruptured: Secondary | ICD-10-CM | POA: Insufficient documentation

## 2011-03-16 DIAGNOSIS — Z01812 Encounter for preprocedural laboratory examination: Secondary | ICD-10-CM | POA: Insufficient documentation

## 2011-03-16 DIAGNOSIS — R011 Cardiac murmur, unspecified: Secondary | ICD-10-CM | POA: Insufficient documentation

## 2011-03-16 DIAGNOSIS — Q605 Renal hypoplasia, unspecified: Secondary | ICD-10-CM | POA: Insufficient documentation

## 2011-03-16 DIAGNOSIS — I10 Essential (primary) hypertension: Secondary | ICD-10-CM | POA: Insufficient documentation

## 2011-03-16 DIAGNOSIS — E119 Type 2 diabetes mellitus without complications: Secondary | ICD-10-CM | POA: Insufficient documentation

## 2011-03-16 DIAGNOSIS — M329 Systemic lupus erythematosus, unspecified: Secondary | ICD-10-CM | POA: Insufficient documentation

## 2011-03-16 DIAGNOSIS — Q602 Renal agenesis, unspecified: Secondary | ICD-10-CM | POA: Insufficient documentation

## 2011-03-16 DIAGNOSIS — M069 Rheumatoid arthritis, unspecified: Secondary | ICD-10-CM | POA: Insufficient documentation

## 2011-03-16 DIAGNOSIS — Z9889 Other specified postprocedural states: Secondary | ICD-10-CM | POA: Insufficient documentation

## 2011-03-16 DIAGNOSIS — N289 Disorder of kidney and ureter, unspecified: Secondary | ICD-10-CM | POA: Insufficient documentation

## 2011-03-16 LAB — CBC
HCT: 37.7 % (ref 36.0–46.0)
Hemoglobin: 12.8 g/dL (ref 12.0–15.0)
MCH: 30.4 pg (ref 26.0–34.0)
MCHC: 34 g/dL (ref 30.0–36.0)
MCV: 89.5 fL (ref 78.0–100.0)
Platelets: 168 10*3/uL (ref 150–400)
RBC: 4.21 MIL/uL (ref 3.87–5.11)
RDW: 12.8 % (ref 11.5–15.5)
WBC: 6.6 10*3/uL (ref 4.0–10.5)

## 2011-03-16 LAB — POCT I-STAT, CHEM 8
BUN: 40 mg/dL — ABNORMAL HIGH (ref 6–23)
Calcium, Ion: 1.19 mmol/L (ref 1.12–1.32)
Chloride: 111 mEq/L (ref 96–112)
Creatinine, Ser: 1.6 mg/dL — ABNORMAL HIGH (ref 0.50–1.10)
Glucose, Bld: 117 mg/dL — ABNORMAL HIGH (ref 70–99)
HCT: 38 % (ref 36.0–46.0)
Hemoglobin: 12.9 g/dL (ref 12.0–15.0)
Potassium: 3.9 mEq/L (ref 3.5–5.1)
Sodium: 146 mEq/L — ABNORMAL HIGH (ref 135–145)
TCO2: 25 mmol/L (ref 0–100)

## 2011-03-16 LAB — APTT: aPTT: 27 seconds (ref 24–37)

## 2011-03-16 LAB — PROTIME-INR
INR: 0.94 (ref 0.00–1.49)
Prothrombin Time: 12.8 seconds (ref 11.6–15.2)

## 2011-03-22 LAB — COMPREHENSIVE METABOLIC PANEL
ALT: 25
AST: 20
Albumin: 3.7
Alkaline Phosphatase: 52
BUN: 32 — ABNORMAL HIGH
CO2: 27
Calcium: 9.1
Chloride: 109
Creatinine, Ser: 1.22 — ABNORMAL HIGH
GFR calc Af Amer: 54 — ABNORMAL LOW
GFR calc non Af Amer: 45 — ABNORMAL LOW
Glucose, Bld: 110 — ABNORMAL HIGH
Potassium: 4.1
Sodium: 144
Total Bilirubin: 1.4 — ABNORMAL HIGH
Total Protein: 6.5

## 2011-03-22 LAB — CBC
HCT: 36.3
Hemoglobin: 12.2
MCHC: 33.7
MCV: 90.7
Platelets: 141 — ABNORMAL LOW
RBC: 4
RDW: 13.4
WBC: 6.9

## 2011-03-22 LAB — BASIC METABOLIC PANEL
BUN: 20
CO2: 27
Calcium: 9.2
Chloride: 112
Creatinine, Ser: 1.12
GFR calc Af Amer: 60 — ABNORMAL LOW
GFR calc non Af Amer: 49 — ABNORMAL LOW
Glucose, Bld: 90
Potassium: 3.7
Sodium: 145

## 2011-03-22 LAB — PROTIME-INR
INR: 1
Prothrombin Time: 13

## 2011-03-22 LAB — GLUCOSE, CAPILLARY: Glucose-Capillary: 115 — ABNORMAL HIGH

## 2011-04-02 LAB — BASIC METABOLIC PANEL
BUN: 23
BUN: 24 — ABNORMAL HIGH
CO2: 27
CO2: 27
Calcium: 8.7
Calcium: 8.8
Chloride: 109
Chloride: 110
Creatinine, Ser: 1.2
Creatinine, Ser: 1.34 — ABNORMAL HIGH
GFR calc Af Amer: 49 — ABNORMAL LOW
GFR calc Af Amer: 55 — ABNORMAL LOW
GFR calc non Af Amer: 40 — ABNORMAL LOW
GFR calc non Af Amer: 46 — ABNORMAL LOW
Glucose, Bld: 121 — ABNORMAL HIGH
Glucose, Bld: 92
Potassium: 3.3 — ABNORMAL LOW
Potassium: 3.9
Sodium: 144
Sodium: 144

## 2011-04-02 LAB — CBC
HCT: 38.6
Hemoglobin: 13.1
MCHC: 33.9
MCV: 90.2
Platelets: 224
RBC: 4.28
RDW: 14
WBC: 9.5

## 2011-04-02 LAB — APTT: aPTT: 26

## 2011-04-02 LAB — PROTIME-INR
INR: 0.9
Prothrombin Time: 12.4

## 2011-05-17 ENCOUNTER — Encounter: Payer: Self-pay | Admitting: Internal Medicine

## 2011-05-22 ENCOUNTER — Encounter: Payer: Self-pay | Admitting: Internal Medicine

## 2011-06-22 ENCOUNTER — Encounter: Payer: Self-pay | Admitting: Internal Medicine

## 2011-08-04 ENCOUNTER — Other Ambulatory Visit: Payer: PRIVATE HEALTH INSURANCE

## 2011-08-06 ENCOUNTER — Ambulatory Visit (INDEPENDENT_AMBULATORY_CARE_PROVIDER_SITE_OTHER): Payer: PRIVATE HEALTH INSURANCE | Admitting: *Deleted

## 2011-08-06 DIAGNOSIS — R944 Abnormal results of kidney function studies: Secondary | ICD-10-CM

## 2011-08-11 NOTE — Procedures (Unsigned)
RENAL ARTERY DUPLEX EVALUATION  INDICATION:  Elevated creatinine  HISTORY: Diabetes:  Yes Cardiac:  Yes Hypertension:  Yes Smoking:  No  RENAL ARTERY DUPLEX FINDINGS: Aorta-Proximal:  84 cm/s Aorta-Mid:  97 cm/s Aorta-Distal:  108 cm/s Celiac Artery Origin:  117 cm/s SMA Origin:  81 cm/s                                   RIGHT               LEFT Renal Artery Origin:             89 cm/s             cm/s Renal Artery Proximal:           104 cm/s            cm/s Renal Artery Mid:                169 cm/s            cm/s Renal Artery Distal:             140 cm/s            cm/s Hilar Acceleration Time (AT):    m/s2                m/s2 Renal-Aortic Ratio (RAR):        1.7 Kidney Size:                     Approximately 13.5 cm                  cm End Diastolic Ratio (EDR): Resistive Index (RI):            0.70/0.70  IMPRESSION: 1. No evidence of significant (>60%) stenosis of the right renal     artery. 2. Kidney size is approximate due to presence of nonvascularized     cystic structures at the upper and lower poles measuring 3.2 cm x     4.6 cm and 2.4 cm x 2.7 cm respectively.  Kidney does not appear     hypotrophic. 3. RAR and RI values are within normal limits. 4. 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  ___________________________________________ Rosetta Posner, M.D.  LT/MEDQ  D:  08/06/2011  T:  08/06/2011  Job:  [id number]

## 2011-09-22 ENCOUNTER — Ambulatory Visit: Payer: Self-pay | Admitting: Internal Medicine

## 2011-09-24 ENCOUNTER — Ambulatory Visit: Payer: Self-pay | Admitting: Internal Medicine

## 2012-01-11 ENCOUNTER — Other Ambulatory Visit (HOSPITAL_COMMUNITY): Payer: Self-pay | Admitting: Interventional Radiology

## 2012-01-11 DIAGNOSIS — I729 Aneurysm of unspecified site: Secondary | ICD-10-CM

## 2012-01-11 DIAGNOSIS — R519 Headache, unspecified: Secondary | ICD-10-CM

## 2012-01-13 ENCOUNTER — Encounter (HOSPITAL_COMMUNITY): Payer: Self-pay | Admitting: Respiratory Therapy

## 2012-01-21 ENCOUNTER — Other Ambulatory Visit: Payer: Self-pay | Admitting: Radiology

## 2012-01-25 ENCOUNTER — Ambulatory Visit: Payer: Self-pay | Admitting: Emergency Medicine

## 2012-01-25 HISTORY — PX: BREAST CYST ASPIRATION: SHX578

## 2012-01-28 ENCOUNTER — Other Ambulatory Visit (HOSPITAL_COMMUNITY): Payer: Self-pay | Admitting: Interventional Radiology

## 2012-01-28 ENCOUNTER — Encounter (HOSPITAL_COMMUNITY): Payer: Self-pay

## 2012-01-28 ENCOUNTER — Ambulatory Visit (HOSPITAL_COMMUNITY)
Admission: RE | Admit: 2012-01-28 | Discharge: 2012-01-28 | Disposition: A | Discharge status: Home / Self Care | Payer: PRIVATE HEALTH INSURANCE | Source: Ambulatory Visit | Attending: Interventional Radiology | Admitting: Interventional Radiology

## 2012-01-28 DIAGNOSIS — I671 Cerebral aneurysm, nonruptured: Secondary | ICD-10-CM | POA: Insufficient documentation

## 2012-01-28 DIAGNOSIS — R51 Headache: Secondary | ICD-10-CM | POA: Insufficient documentation

## 2012-01-28 DIAGNOSIS — E119 Type 2 diabetes mellitus without complications: Secondary | ICD-10-CM | POA: Insufficient documentation

## 2012-01-28 DIAGNOSIS — I729 Aneurysm of unspecified site: Secondary | ICD-10-CM

## 2012-01-28 DIAGNOSIS — I129 Hypertensive chronic kidney disease with stage 1 through stage 4 chronic kidney disease, or unspecified chronic kidney disease: Secondary | ICD-10-CM | POA: Insufficient documentation

## 2012-01-28 DIAGNOSIS — R519 Headache, unspecified: Secondary | ICD-10-CM

## 2012-01-28 DIAGNOSIS — M329 Systemic lupus erythematosus, unspecified: Secondary | ICD-10-CM | POA: Insufficient documentation

## 2012-01-28 DIAGNOSIS — N189 Chronic kidney disease, unspecified: Secondary | ICD-10-CM | POA: Insufficient documentation

## 2012-01-28 HISTORY — DX: Chronic kidney disease, unspecified: N18.9

## 2012-01-28 HISTORY — DX: Systemic lupus erythematosus, unspecified: M32.9

## 2012-01-28 HISTORY — DX: Essential (primary) hypertension: I10

## 2012-01-28 HISTORY — PX: OTHER SURGICAL HISTORY: SHX169

## 2012-01-28 HISTORY — DX: Headache: R51

## 2012-01-28 LAB — CBC WITH DIFFERENTIAL/PLATELET
Basophils Absolute: 0 10*3/uL (ref 0.0–0.1)
Basophils Relative: 1 % (ref 0–1)
Eosinophils Absolute: 0.2 10*3/uL (ref 0.0–0.7)
Eosinophils Relative: 3 % (ref 0–5)
HCT: 36.1 % (ref 36.0–46.0)
Hemoglobin: 12.1 g/dL (ref 12.0–15.0)
Lymphocytes Relative: 34 % (ref 12–46)
Lymphs Abs: 2.1 10*3/uL (ref 0.7–4.0)
MCH: 30.5 pg (ref 26.0–34.0)
MCHC: 33.5 g/dL (ref 30.0–36.0)
MCV: 90.9 fL (ref 78.0–100.0)
Monocytes Absolute: 0.5 10*3/uL (ref 0.1–1.0)
Monocytes Relative: 8 % (ref 3–12)
Neutro Abs: 3.4 10*3/uL (ref 1.7–7.7)
Neutrophils Relative %: 55 % (ref 43–77)
Platelets: 155 10*3/uL (ref 150–400)
RBC: 3.97 MIL/uL (ref 3.87–5.11)
RDW: 13.4 % (ref 11.5–15.5)
WBC: 6.2 10*3/uL (ref 4.0–10.5)

## 2012-01-28 LAB — BASIC METABOLIC PANEL
BUN: 32 mg/dL — ABNORMAL HIGH (ref 6–23)
CO2: 24 mEq/L (ref 19–32)
Calcium: 8.4 mg/dL (ref 8.4–10.5)
Chloride: 112 mEq/L (ref 96–112)
Creatinine, Ser: 1.2 mg/dL — ABNORMAL HIGH (ref 0.50–1.10)
GFR calc Af Amer: 54 mL/min — ABNORMAL LOW (ref >90)
GFR calc non Af Amer: 46 mL/min — ABNORMAL LOW (ref >90)
Glucose, Bld: 131 mg/dL — ABNORMAL HIGH (ref 70–99)
Potassium: 5.8 mEq/L — ABNORMAL HIGH (ref 3.5–5.1)
Sodium: 143 mEq/L (ref 135–145)

## 2012-01-28 LAB — PROTIME-INR
INR: 0.91 (ref 0.00–1.49)
Prothrombin Time: 12.5 seconds (ref 11.6–15.2)

## 2012-01-28 LAB — APTT: aPTT: 28 seconds (ref 24–37)

## 2012-01-28 MED ORDER — HYDRALAZINE HCL 25 MG PO TABS
25.0000 mg | ORAL_TABLET | Freq: Once | ORAL | Status: AC
  Administered 2012-01-28: 25 mg via ORAL
  Filled 2012-01-28: qty 1

## 2012-01-28 MED ORDER — ATENOLOL 100 MG PO TABS
100.0000 mg | ORAL_TABLET | Freq: Once | ORAL | Status: AC
  Administered 2012-01-28: 100 mg via ORAL
  Filled 2012-01-28: qty 1

## 2012-01-28 MED ORDER — HEPARIN SOD (PORK) LOCK FLUSH 100 UNIT/ML IV SOLN
INTRAVENOUS | Status: AC | PRN
  Administered 2012-01-28: 500 [IU] via INTRAVENOUS

## 2012-01-28 MED ORDER — SODIUM CHLORIDE 0.9 % IV SOLN
INTRAVENOUS | Status: DC
  Administered 2012-01-28: 09:00:00 via INTRAVENOUS

## 2012-01-28 MED ORDER — MIDAZOLAM HCL 2 MG/2ML IJ SOLN
INTRAMUSCULAR | Status: AC
  Filled 2012-01-28: qty 4

## 2012-01-28 MED ORDER — HYDRALAZINE HCL 20 MG/ML IJ SOLN
INTRAMUSCULAR | Status: AC | PRN
  Administered 2012-01-28: 5 mg via INTRAVENOUS

## 2012-01-28 MED ORDER — FENTANYL CITRATE 0.05 MG/ML IJ SOLN
INTRAMUSCULAR | Status: AC
  Filled 2012-01-28: qty 4

## 2012-01-28 MED ORDER — MIDAZOLAM HCL 5 MG/5ML IJ SOLN
INTRAMUSCULAR | Status: AC | PRN
  Administered 2012-01-28: 1 mg via INTRAVENOUS

## 2012-01-28 MED ORDER — HYDRALAZINE HCL 20 MG/ML IJ SOLN
INTRAMUSCULAR | Status: AC
  Filled 2012-01-28: qty 1

## 2012-01-28 MED ORDER — IOHEXOL 300 MG/ML  SOLN
150.0000 mL | Freq: Once | INTRAMUSCULAR | Status: AC | PRN
  Administered 2012-01-28: 60 mL via INTRAVENOUS

## 2012-01-28 MED ORDER — FENTANYL CITRATE 0.05 MG/ML IJ SOLN
INTRAMUSCULAR | Status: AC | PRN
  Administered 2012-01-28: 25 ug via INTRAVENOUS

## 2012-01-28 NOTE — Procedures (Signed)
S/P bilateral common carotid arteriograms  RT CFA approach . Preliminary findings  1. Obliterated rt MCA aneurusm 2. 3.75mm x 2 mm Acom region neck remanant.

## 2012-01-28 NOTE — H&P (Signed)
[name] is an 66 y.o. female.   Chief Complaint: previous Anterior communicating artery aneurysm coil 06/2006 Right middle cerebral artery aneurysm coil 08/2006; recanalization - additional coils 09/2010 Onset headaches in 09/2011; has been seen by PCP; no relief; recent CTA Scheduled now for cerebral arteriogram today HPI: HTN; solitary kidney- congenital; DM; SLE  Past Medical History  Diagnosis Date  . Hypertension   . Chronic kidney disease   . Headache   . Diabetes mellitus   . Systemic lupus erythematosus     Past Surgical History  Procedure Date  . Cerebral aneurysm repair 2008    A COM  . Cerebral aneurysm repair 2008    R MCA  . Eye surgery     cataract  . Cervical spine surgery     No family history on file. Social History:  reports that she has quit smoking. She does not have any smokeless tobacco history on file. Her alcohol and drug histories not on file.  Allergies:  Allergies  Allergen Reactions  . Sulfa Antibiotics Itching     (Not in a hospital admission)  Results for orders placed during the hospital encounter of 01/28/12 (from the past 48 hour(s))  APTT     Status: Normal   Collection Time   01/28/12  8:05 AM      Component Value Range Comment   aPTT 28  24 - 37 seconds   CBC WITH DIFFERENTIAL     Status: Normal   Collection Time   01/28/12  8:05 AM      Component Value Range Comment   WBC 6.2  4.0 - 10.5 K/uL    RBC 3.97  3.87 - 5.11 MIL/uL    Hemoglobin 12.1  12.0 - 15.0 g/dL    HCT 36.1  36.0 - 46.0 %    MCV 90.9  78.0 - 100.0 fL    MCH 30.5  26.0 - 34.0 pg    MCHC 33.5  30.0 - 36.0 g/dL    RDW 13.4  11.5 - 15.5 %    Platelets 155  150 - 400 K/uL    Neutrophils Relative 55  43 - 77 %    Neutro Abs 3.4  1.7 - 7.7 K/uL    Lymphocytes Relative 34  12 - 46 %    Lymphs Abs 2.1  0.7 - 4.0 K/uL    Monocytes Relative 8  3 - 12 %    Monocytes Absolute 0.5  0.1 - 1.0 K/uL    Eosinophils Relative 3  0 - 5 %    Eosinophils Absolute 0.2   0.0 - 0.7 K/uL    Basophils Relative 1  0 - 1 %    Basophils Absolute 0.0  0.0 - 0.1 K/uL   PROTIME-INR     Status: Normal   Collection Time   01/28/12  8:05 AM      Component Value Range Comment   Prothrombin Time 12.5  11.6 - 15.2 seconds    INR 0.91  0.00 - 1.49    No results found.  Review of Systems  Constitutional: Negative for fever.  Respiratory: Negative for shortness of breath.   Cardiovascular: Negative for chest pain.  Gastrointestinal: Negative for nausea, vomiting, abdominal pain and diarrhea.  Neurological: Positive for headaches. Negative for dizziness, focal weakness and weakness.    Blood pressure 177/93, pulse 74, temperature 97.3 F (36.3 C), temperature source Oral, resp. rate 18, height 5\' 4"  (1.626 m), weight 179 lb (81.194 kg),  SpO2 94.00%. Physical Exam  Constitutional: She is oriented to person, place, and time. She appears well-developed and well-nourished.  HENT:  Head: Normocephalic.  Eyes: EOM are normal.  Cardiovascular: Normal rate, regular rhythm and normal heart sounds.   No murmur heard. Respiratory: Effort normal and breath sounds normal. She has no wheezes.  GI: Soft. Bowel sounds are normal. There is no tenderness.  Musculoskeletal: Normal range of motion.  Neurological: She is alert and oriented to person, place, and time.  Psychiatric: She has a normal mood and affect. Her behavior is normal. Judgment and thought content normal.     Assessment/Plan Previous A COM and R MCA artery aneurysm coilings(2008) with additional coils in R MCA(2012) Now with headache daily x 4 months Scheduled now for cerebral arteriogram Pt aware of procedure benefits and risks and agreeable to proceed - solitary kidney Consent signed.  Alta Goding A 01/28/2012, 8:55 AM

## 2012-02-04 HISTORY — PX: BREAST BIOPSY: SHX20

## 2012-02-08 ENCOUNTER — Telehealth (HOSPITAL_COMMUNITY): Payer: Self-pay

## 2012-02-08 NOTE — Telephone Encounter (Signed)
I spoke with [name] in regards to her procedure.  She stated that she will miss class but, she will keep the 4th.  She stated that she had to cancel her pre-op apt due to school.  I offered to call [name] and get it scheduled on a day that would be more convenient for her.  I called [name] left the info and she called me back to let me know she would put the apt on a Weds. Or [name]

## 2012-02-11 ENCOUNTER — Encounter (HOSPITAL_COMMUNITY): Payer: Self-pay | Admitting: Pharmacy Technician

## 2012-02-11 ENCOUNTER — Encounter (HOSPITAL_COMMUNITY)
Admission: RE | Admit: 2012-02-11 | Discharge: 2012-02-11 | Disposition: A | Discharge status: Home / Self Care | Payer: PRIVATE HEALTH INSURANCE | Source: Ambulatory Visit | Attending: Interventional Radiology | Admitting: Interventional Radiology

## 2012-02-11 ENCOUNTER — Ambulatory Visit (HOSPITAL_COMMUNITY)
Admission: RE | Admit: 2012-02-11 | Discharge: 2012-02-11 | Disposition: A | Discharge status: Home / Self Care | Payer: PRIVATE HEALTH INSURANCE | Source: Ambulatory Visit | Attending: Interventional Radiology | Admitting: Interventional Radiology

## 2012-02-11 ENCOUNTER — Other Ambulatory Visit: Payer: Self-pay | Admitting: Physician Assistant

## 2012-02-11 ENCOUNTER — Encounter (HOSPITAL_COMMUNITY): Payer: Self-pay

## 2012-02-11 DIAGNOSIS — Z01812 Encounter for preprocedural laboratory examination: Secondary | ICD-10-CM | POA: Insufficient documentation

## 2012-02-11 DIAGNOSIS — Z01818 Encounter for other preprocedural examination: Secondary | ICD-10-CM | POA: Insufficient documentation

## 2012-02-11 HISTORY — DX: Unspecified osteoarthritis, unspecified site: M19.90

## 2012-02-11 HISTORY — DX: Cardiac murmur, unspecified: R01.1

## 2012-02-11 HISTORY — DX: Other complications of anesthesia, initial encounter: T88.59XA

## 2012-02-11 HISTORY — DX: Adverse effect of unspecified anesthetic, initial encounter: T41.45XA

## 2012-02-11 HISTORY — DX: Sleep apnea, unspecified: G47.30

## 2012-02-11 HISTORY — DX: Cerebral aneurysm, nonruptured: I67.1

## 2012-02-11 LAB — BASIC METABOLIC PANEL
BUN: 23 mg/dL (ref 6–23)
CO2: 30 mEq/L (ref 19–32)
Calcium: 9.9 mg/dL (ref 8.4–10.5)
Chloride: 107 mEq/L (ref 96–112)
Creatinine, Ser: 1.19 mg/dL — ABNORMAL HIGH (ref 0.50–1.10)
GFR calc Af Amer: 54 mL/min — ABNORMAL LOW (ref >90)
GFR calc non Af Amer: 47 mL/min — ABNORMAL LOW (ref >90)
Glucose, Bld: 114 mg/dL — ABNORMAL HIGH (ref 70–99)
Potassium: 3.5 mEq/L (ref 3.5–5.1)
Sodium: 145 mEq/L (ref 135–145)

## 2012-02-11 LAB — CBC WITH DIFFERENTIAL/PLATELET
Basophils Absolute: 0 10*3/uL (ref 0.0–0.1)
Basophils Relative: 1 % (ref 0–1)
Eosinophils Absolute: 0.2 10*3/uL (ref 0.0–0.7)
Eosinophils Relative: 4 % (ref 0–5)
HCT: 38.6 % (ref 36.0–46.0)
Hemoglobin: 13.2 g/dL (ref 12.0–15.0)
Lymphocytes Relative: 39 % (ref 12–46)
Lymphs Abs: 2.4 10*3/uL (ref 0.7–4.0)
MCH: 30.8 pg (ref 26.0–34.0)
MCHC: 34.2 g/dL (ref 30.0–36.0)
MCV: 90.2 fL (ref 78.0–100.0)
Monocytes Absolute: 0.4 10*3/uL (ref 0.1–1.0)
Monocytes Relative: 6 % (ref 3–12)
Neutro Abs: 3.1 10*3/uL (ref 1.7–7.7)
Neutrophils Relative %: 50 % (ref 43–77)
Platelets: 205 10*3/uL (ref 150–400)
RBC: 4.28 MIL/uL (ref 3.87–5.11)
RDW: 13.3 % (ref 11.5–15.5)
WBC: 6.2 10*3/uL (ref 4.0–10.5)

## 2012-02-11 LAB — PROTIME-INR
INR: 0.97 (ref 0.00–1.49)
Prothrombin Time: 13.1 seconds (ref 11.6–15.2)

## 2012-02-11 LAB — APTT: aPTT: 28 seconds (ref 24–37)

## 2012-02-11 LAB — PLATELET INHIBITION P2Y12: Platelet Function  P2Y12: 188 [PRU] — ABNORMAL LOW (ref 194–418)

## 2012-02-11 NOTE — Progress Notes (Addendum)
req'd notes, tests ,ekg from dr Neoma Laming [place] [phone]   sleep study req'd from there also.   Platelet inhibition test ordered  per pam campbell. [facility] unable to do today.'; to be couriered to [facility] and done there if can arrive before 1630. p campbell informed and  Message left  for [place] [id number] to let us know if done.

## 2012-02-11 NOTE — Pre-Procedure Instructions (Signed)
[name]  02/11/2012   Your procedure is scheduled on:  Wednesday September 4th   Report to [facility] at [place].   Call this number if you have problems the morning of surgery: [phone]   Remember:   Do not eat food or drink:After Midnight .    Take these medicines the morning of surgery with A SIP OF WATER: Allopurinol, atenolol, hydralazine   Do not wear jewelry, make-up or nail polish.  Do not wear lotions, powders, or perfumes. You may wear deodorant.  Do not shave 48 hours prior to surgery. Men may shave face and neck.  Do not bring valuables to the hospital.  Contacts, dentures or bridgework may not be worn into surgery.  Leave suitcase in the car. After surgery it may be brought to your room.  For patients admitted to the hospital, checkout time is 11:00 AM the day of discharge.   Patients discharged the day of surgery will not be allowed to drive home. Tally [place] [phone]    Special Instructions: CHG Shower Use Special Wash: 1/2 bottle night before surgery and 1/2 bottle morning of surgery.   Please read over the following fact sheets that you were given: Pain Booklet, Coughing and Deep Breathing, MRSA Information and Surgical Site Infection Prevention

## 2012-02-11 NOTE — Pre-Procedure Instructions (Signed)
Pisek Chaddock  02/11/2012   Your procedure is scheduled on:  Wednesday September 4th   Report to [facility] at [place].   Call this number if you have problems the morning of surgery: [phone]   Remember:   Do not eat food or drink:After Midnight .    Take these medicines the morning of surgery with A SIP OF WATER: Allopurinol, atenolol, hydralazine   Do not wear jewelry, make-up or nail polish.  Do not wear lotions, powders, or perfumes. You may wear deodorant.  Do not shave 48 hours prior to surgery. Men may shave face and neck.  Do not bring valuables to the hospital.  Contacts, dentures or bridgework may not be worn into surgery.  Leave suitcase in the car. After surgery it may be brought to your room.  For patients admitted to the hospital, checkout time is 11:00 AM the day of discharge.   Patients discharged the day of surgery will not be allowed to drive home.    Special Instructions: CHG Shower Use Special Wash: 1/2 bottle night before surgery and 1/2 bottle morning of surgery.   Please read over the following fact sheets that you were given: Pain Booklet, Coughing and Deep Breathing, MRSA Information and Surgical Site Infection Prevention

## 2012-02-14 ENCOUNTER — Telehealth (HOSPITAL_COMMUNITY): Payer: Self-pay

## 2012-02-14 NOTE — Telephone Encounter (Signed)
Per Dr. Estanislado Pandy on 02-14-12  Pt's platelet level of 188 is fine to proceed.

## 2012-02-17 NOTE — Progress Notes (Signed)
Spoke with Dr Marella Bile office and they will fax any other testing, notes and sleep study they have shortly.  We only received an EKG

## 2012-02-22 MED ORDER — CEFAZOLIN SODIUM-DEXTROSE 2-3 GM-% IV SOLR
2.0000 g | INTRAVENOUS | Status: DC
  Filled 2012-02-22 (×2): qty 50

## 2012-02-23 ENCOUNTER — Ambulatory Visit (HOSPITAL_COMMUNITY)
Admission: RE | Admit: 2012-02-23 | Discharge: 2012-02-23 | Payer: PRIVATE HEALTH INSURANCE | Source: Ambulatory Visit | Attending: Interventional Radiology | Admitting: Interventional Radiology

## 2012-02-23 ENCOUNTER — Encounter (HOSPITAL_COMMUNITY): Payer: Self-pay

## 2012-02-23 ENCOUNTER — Other Ambulatory Visit: Payer: Self-pay | Admitting: Physician Assistant

## 2012-02-23 ENCOUNTER — Ambulatory Visit (HOSPITAL_COMMUNITY)
Admission: RE | Admit: 2012-02-23 | Discharge: 2012-02-23 | Disposition: A | Discharge status: Home / Self Care | Payer: PRIVATE HEALTH INSURANCE | Source: Ambulatory Visit | Attending: Interventional Radiology | Admitting: Interventional Radiology

## 2012-02-23 LAB — GLUCOSE, CAPILLARY
Glucose-Capillary: 119 mg/dL — ABNORMAL HIGH (ref 70–99)
Glucose-Capillary: 115 mg/dL — ABNORMAL HIGH (ref 70–99)

## 2012-02-23 MED ORDER — CEFAZOLIN SODIUM-DEXTROSE 2-3 GM-% IV SOLR
2.0000 g | Freq: Once | INTRAVENOUS | Status: AC
  Administered 2012-02-24: 2 g via INTRAVENOUS
  Filled 2012-02-23 (×2): qty 50

## 2012-02-23 MED ORDER — SODIUM CHLORIDE 0.9 % IV SOLN: INTRAVENOUS | Status: DC

## 2012-02-23 MED ORDER — CLOPIDOGREL BISULFATE 75 MG PO TABS
75.0000 mg | ORAL_TABLET | ORAL | Status: AC
  Administered 2012-02-23: 75 mg via ORAL
  Filled 2012-02-23: qty 1

## 2012-02-23 MED ORDER — NIMODIPINE 30 MG PO CAPS: 60.0000 mg | ORAL_CAPSULE | ORAL | Status: DC

## 2012-02-23 MED ORDER — ASPIRIN EC 325 MG PO TBEC
325.0000 mg | DELAYED_RELEASE_TABLET | ORAL | Status: AC
  Administered 2012-02-23: 325 mg via ORAL
  Filled 2012-02-23: qty 1

## 2012-02-23 NOTE — H&P (Signed)
[name] is an 66 y.o. female.   Chief Complaint: Previously treated cerebral aneurysms Anterior communicating artery aneurysm coil 2008;  Rt Middle cerebral artery aneurysm coil 2008 - stent 2012 persistent headaches Cerebral arteriogram 01/11/12 shows Anterior Comm Artery aneurysm neck remnant Scheduled now for A COM aneurysm stent placement HPI: HTN; CKD; DM; SLE; cerebral aneurysms  Past Medical History  Diagnosis Date  . Hypertension   . Chronic kidney disease   . Headache   . Diabetes mellitus   . Systemic lupus erythematosus   . Complication of anesthesia     "take a long time to awaken "  . Sleep apnea     does not use cpap  . Heart murmur   . Cerebral aneurysm   . Arthritis     Past Surgical History  Procedure Date  . Cerebral aneurysm repair 2008    A COM  . Cerebral aneurysm repair 2008    R MCA  . Eye surgery     cataract  . Cervical spine surgery   . Cardiac catheterization 08    stents  . Aneurysm coiling 3/15  . Back surgery   . Bartholin cyst marsupialization     No family history on file. Social History:  reports that she has never smoked. She does not have any smokeless tobacco history on file. She reports that she does not drink alcohol or use illicit drugs.  Allergies:  Allergies  Allergen Reactions  . Sulfa Antibiotics Itching     (Not in a hospital admission)  Results for orders placed during the hospital encounter of 02/23/12 (from the past 48 hour(s))  GLUCOSE, CAPILLARY     Status: Abnormal   Collection Time   02/23/12  7:03 AM      Component Value Range Comment   Glucose-Capillary 119 (*) 70 - 99 mg/dL    No results found.  Review of Systems  Constitutional: Negative for fever and chills.  HENT: Negative for hearing loss.   Eyes: Negative for double vision.  Respiratory: Negative for cough.   Cardiovascular: Negative for chest pain.  Gastrointestinal: Negative for nausea and vomiting.  Musculoskeletal: Positive  for back pain.  Neurological: Positive for headaches. Negative for dizziness and focal weakness.    There were no vitals taken for this visit. Physical Exam  Constitutional: She appears well-developed and well-nourished.  Eyes: EOM are normal.  Neck: Normal range of motion.  Cardiovascular: Normal rate, regular rhythm and normal heart sounds.   No murmur heard. Respiratory: Effort normal and breath sounds normal.  GI: Soft. Bowel sounds are normal. There is no tenderness.  Musculoskeletal: Normal range of motion.  Neurological: She is alert.  Psychiatric: She has a normal mood and affect. Her behavior is normal. Judgment and thought content normal.     Assessment/Plan Previously treated A COM aneurysm coil 2008; Rt MCA coil 2008 and stent 2012 Now scheduled for A COM stent placement per arteriogram findings 01/11/12: neck remnant Pt aware of procedure benefits and risks and agreeable to proceed Understands if intervention is performed she will spend night in Neuro ICU Consent signed.  Sagrario Lineberry A 02/23/2012, 8:33 AM

## 2012-02-23 NOTE — Progress Notes (Signed)
Pt informed by radiology PA Jannifer Franklin that today's procedure has been canceled and will be rescheduled for tomorrow.  Pt understands and is dressing to go home.  Instructed pt that she will be called later today with tomorrows arrival time and instructions

## 2012-02-23 NOTE — Anesthesia Preprocedure Evaluation (Addendum)
Anesthesia Evaluation  Patient identified by MRN, date of birth, ID band  Reviewed: H&P , NPO status , Patient's Chart, lab work & pertinent test results  Airway Mallampati: II TM Distance: >3 FB Neck ROM: Full    Dental  (+) Teeth Intact   Pulmonary sleep apnea ,  breath sounds clear to auscultation        Cardiovascular hypertension, Rhythm:Regular Rate:Normal     Neuro/Psych  Headaches, Cerebral aneurysm    GI/Hepatic   Endo/Other  diabetes  Renal/GU Renal disease     Musculoskeletal   Abdominal   Peds  Hematology   Anesthesia Other Findings   Reproductive/Obstetrics                         Anesthesia Physical Anesthesia Plan  ASA: III  Anesthesia Plan: General   Post-op Pain Management:    Induction: Intravenous  Airway Management Planned: Oral ETT  Additional Equipment: Arterial line  Intra-op Plan:   Post-operative Plan: Possible Post-op intubation/ventilation  Informed Consent: I have reviewed the patients History and Physical, chart, labs and discussed the procedure including the risks, benefits and alternatives for the proposed anesthesia with the patient or authorized representative who has indicated his/her understanding and acceptance.     Plan Discussed with: CRNA and Surgeon  Anesthesia Plan Comments:         Anesthesia Quick Evaluation

## 2012-02-24 ENCOUNTER — Ambulatory Visit (HOSPITAL_COMMUNITY)
Admission: RE | Admit: 2012-02-24 | Discharge: 2012-02-24 | Disposition: A | Discharge status: Home / Self Care | Payer: PRIVATE HEALTH INSURANCE | Source: Ambulatory Visit | Attending: Interventional Radiology | Admitting: Interventional Radiology

## 2012-02-24 ENCOUNTER — Encounter (HOSPITAL_COMMUNITY): Payer: Self-pay | Admitting: *Deleted

## 2012-02-24 ENCOUNTER — Telehealth (HOSPITAL_COMMUNITY): Payer: Self-pay

## 2012-02-24 ENCOUNTER — Encounter (HOSPITAL_COMMUNITY): Payer: Self-pay

## 2012-02-24 ENCOUNTER — Ambulatory Visit (HOSPITAL_COMMUNITY): Payer: PRIVATE HEALTH INSURANCE | Admitting: *Deleted

## 2012-02-24 ENCOUNTER — Ambulatory Visit: Admit: 2012-02-24 | Payer: Self-pay | Admitting: Interventional Radiology

## 2012-02-24 ENCOUNTER — Encounter (HOSPITAL_COMMUNITY)
Admission: RE | Disposition: A | Discharge status: Home / Self Care | Payer: Self-pay | Source: Ambulatory Visit | Attending: Interventional Radiology

## 2012-02-24 ENCOUNTER — Ambulatory Visit (HOSPITAL_COMMUNITY)
Admission: RE | Admit: 2012-02-24 | Discharge: 2012-02-24 | DRG: 093 | Disposition: A | Discharge status: Home / Self Care | Payer: PRIVATE HEALTH INSURANCE | Source: Ambulatory Visit | Attending: Interventional Radiology | Admitting: Interventional Radiology

## 2012-02-24 DIAGNOSIS — I671 Cerebral aneurysm, nonruptured: Secondary | ICD-10-CM | POA: Diagnosis present

## 2012-02-24 DIAGNOSIS — I729 Aneurysm of unspecified site: Secondary | ICD-10-CM

## 2012-02-24 LAB — CBC WITH DIFFERENTIAL/PLATELET
Basophils Absolute: 0.1 10*3/uL (ref 0.0–0.1)
Basophils Relative: 1 % (ref 0–1)
Eosinophils Absolute: 0.2 10*3/uL (ref 0.0–0.7)
Eosinophils Relative: 3 % (ref 0–5)
HCT: 35.6 % — ABNORMAL LOW (ref 36.0–46.0)
Hemoglobin: 12 g/dL (ref 12.0–15.0)
Lymphocytes Relative: 40 % (ref 12–46)
Lymphs Abs: 2.4 10*3/uL (ref 0.7–4.0)
MCH: 30.2 pg (ref 26.0–34.0)
MCHC: 33.7 g/dL (ref 30.0–36.0)
MCV: 89.7 fL (ref 78.0–100.0)
Monocytes Absolute: 0.4 10*3/uL (ref 0.1–1.0)
Monocytes Relative: 7 % (ref 3–12)
Neutro Abs: 2.9 10*3/uL (ref 1.7–7.7)
Neutrophils Relative %: 49 % (ref 43–77)
Platelets: 173 10*3/uL (ref 150–400)
RBC: 3.97 MIL/uL (ref 3.87–5.11)
RDW: 13.2 % (ref 11.5–15.5)
WBC: 5.9 10*3/uL (ref 4.0–10.5)

## 2012-02-24 LAB — COMPREHENSIVE METABOLIC PANEL
ALT: 11 U/L (ref 0–35)
AST: 14 U/L (ref 0–37)
Albumin: 3.2 g/dL — ABNORMAL LOW (ref 3.5–5.2)
Alkaline Phosphatase: 53 U/L (ref 39–117)
BUN: 30 mg/dL — ABNORMAL HIGH (ref 6–23)
CO2: 25 mEq/L (ref 19–32)
Calcium: 9 mg/dL (ref 8.4–10.5)
Chloride: 108 mEq/L (ref 96–112)
Creatinine, Ser: 1.31 mg/dL — ABNORMAL HIGH (ref 0.50–1.10)
GFR calc Af Amer: 48 mL/min — ABNORMAL LOW (ref >90)
GFR calc non Af Amer: 42 mL/min — ABNORMAL LOW (ref >90)
Glucose, Bld: 176 mg/dL — ABNORMAL HIGH (ref 70–99)
Potassium: 3.7 mEq/L (ref 3.5–5.1)
Sodium: 141 mEq/L (ref 135–145)
Total Bilirubin: 0.7 mg/dL (ref 0.3–1.2)
Total Protein: 6.2 g/dL (ref 6.0–8.3)

## 2012-02-24 LAB — PLATELET INHIBITION P2Y12: Platelet Function  P2Y12: 207 [PRU] (ref 194–418)

## 2012-02-24 LAB — PROTIME-INR
INR: 1 (ref 0.00–1.49)
Prothrombin Time: 13.4 seconds (ref 11.6–15.2)

## 2012-02-24 LAB — POCT ACTIVATED CLOTTING TIME
Activated Clotting Time: 169 seconds
Activated Clotting Time: 179 seconds

## 2012-02-24 LAB — APTT: aPTT: 29 seconds (ref 24–37)

## 2012-02-24 LAB — GLUCOSE, CAPILLARY: Glucose-Capillary: 118 mg/dL — ABNORMAL HIGH (ref 70–99)

## 2012-02-24 SURGERY — RADIOLOGY WITH ANESTHESIA
Anesthesia: General | Site: Eye

## 2012-02-24 SURGERY — RADIOLOGY WITH ANESTHESIA
Anesthesia: General

## 2012-02-24 MED ORDER — LACTATED RINGERS IV SOLN
INTRAVENOUS | Status: DC | PRN
  Administered 2012-02-24 (×2): via INTRAVENOUS

## 2012-02-24 MED ORDER — LABETALOL HCL 5 MG/ML IV SOLN
INTRAVENOUS | Status: DC | PRN
  Administered 2012-02-24 (×2): 10 mg via INTRAVENOUS

## 2012-02-24 MED ORDER — IOHEXOL 300 MG/ML  SOLN
150.0000 mL | Freq: Once | INTRAMUSCULAR | Status: AC | PRN
  Administered 2012-02-24: 60 mL via INTRA_ARTERIAL

## 2012-02-24 MED ORDER — SODIUM CHLORIDE 0.9 % IV SOLN: INTRAVENOUS | Status: DC

## 2012-02-24 MED ORDER — ASPIRIN EC 325 MG PO TBEC
DELAYED_RELEASE_TABLET | ORAL | Status: AC
  Filled 2012-02-24: qty 1

## 2012-02-24 MED ORDER — CLOPIDOGREL BISULFATE 75 MG PO TABS
75.0000 mg | ORAL_TABLET | ORAL | Status: AC
  Administered 2012-02-24: 75 mg via ORAL

## 2012-02-24 MED ORDER — LIDOCAINE HCL (CARDIAC) 20 MG/ML IV SOLN
INTRAVENOUS | Status: DC | PRN
  Administered 2012-02-24: 80 mg via INTRAVENOUS

## 2012-02-24 MED ORDER — LACTATED RINGERS IV SOLN
INTRAVENOUS | Status: DC
  Administered 2012-02-24: 09:00:00 via INTRAVENOUS

## 2012-02-24 MED ORDER — EPHEDRINE SULFATE 50 MG/ML IJ SOLN
INTRAMUSCULAR | Status: DC | PRN
  Administered 2012-02-24: 5 mg via INTRAVENOUS
  Administered 2012-02-24: 15 mg via INTRAVENOUS

## 2012-02-24 MED ORDER — PROTAMINE SULFATE 10 MG/ML IV SOLN
INTRAVENOUS | Status: DC | PRN
  Administered 2012-02-24: 5 mg via INTRAVENOUS
  Administered 2012-02-24: 2.5 mg via INTRAVENOUS

## 2012-02-24 MED ORDER — MIDAZOLAM HCL 2 MG/2ML IJ SOLN: 1.0000 mg | INTRAMUSCULAR | Status: DC | PRN

## 2012-02-24 MED ORDER — NICARDIPINE HCL IN NACL 20-0.86 MG/200ML-% IV SOLN: 5.0000 mg/h | INTRAVENOUS | Status: DC

## 2012-02-24 MED ORDER — PROPOFOL 10 MG/ML IV EMUL
INTRAVENOUS | Status: DC | PRN
  Administered 2012-02-24: 180 mg via INTRAVENOUS

## 2012-02-24 MED ORDER — FENTANYL CITRATE 0.05 MG/ML IJ SOLN
INTRAMUSCULAR | Status: DC | PRN
  Administered 2012-02-24 (×3): 50 ug via INTRAVENOUS

## 2012-02-24 MED ORDER — ONDANSETRON HCL 4 MG/2ML IJ SOLN
INTRAMUSCULAR | Status: DC | PRN
  Administered 2012-02-24 (×2): 4 mg via INTRAVENOUS

## 2012-02-24 MED ORDER — GLYCOPYRROLATE 0.2 MG/ML IJ SOLN
INTRAMUSCULAR | Status: DC | PRN
  Administered 2012-02-24: .8 mg via INTRAVENOUS

## 2012-02-24 MED ORDER — ACETAMINOPHEN 500 MG PO TABS: 1000.0000 mg | ORAL_TABLET | Freq: Four times a day (QID) | ORAL | Status: DC | PRN

## 2012-02-24 MED ORDER — CLOPIDOGREL BISULFATE 75 MG PO TABS
ORAL_TABLET | ORAL | Status: AC
  Filled 2012-02-24: qty 1

## 2012-02-24 MED ORDER — ROCURONIUM BROMIDE 100 MG/10ML IV SOLN
INTRAVENOUS | Status: DC | PRN
  Administered 2012-02-24: 50 mg via INTRAVENOUS

## 2012-02-24 MED ORDER — PHENYLEPHRINE HCL 10 MG/ML IJ SOLN
INTRAMUSCULAR | Status: DC | PRN
  Administered 2012-02-24: 80 ug via INTRAVENOUS

## 2012-02-24 MED ORDER — ONDANSETRON HCL 4 MG/2ML IJ SOLN
INTRAMUSCULAR | Status: AC
  Filled 2012-02-24: qty 2

## 2012-02-24 MED ORDER — NIMODIPINE 30 MG PO CAPS
60.0000 mg | ORAL_CAPSULE | ORAL | Status: AC
  Administered 2012-02-24: 60 mg via ORAL

## 2012-02-24 MED ORDER — FENTANYL CITRATE 0.05 MG/ML IJ SOLN: 50.0000 ug | INTRAMUSCULAR | Status: DC | PRN

## 2012-02-24 MED ORDER — SODIUM CHLORIDE 0.9 % IJ SOLN
1.5000 mg | INTRAVENOUS | Status: DC
  Filled 2012-02-24: qty 0.3

## 2012-02-24 MED ORDER — ACETAMINOPHEN 650 MG RE SUPP: 650.0000 mg | Freq: Four times a day (QID) | RECTAL | Status: DC | PRN

## 2012-02-24 MED ORDER — PROMETHAZINE HCL 25 MG/ML IJ SOLN: 6.2500 mg | INTRAMUSCULAR | Status: DC | PRN

## 2012-02-24 MED ORDER — FENTANYL CITRATE 0.05 MG/ML IJ SOLN: 25.0000 ug | INTRAMUSCULAR | Status: DC | PRN

## 2012-02-24 MED ORDER — ONDANSETRON HCL 4 MG/2ML IJ SOLN
4.0000 mg | Freq: Four times a day (QID) | INTRAMUSCULAR | Status: DC | PRN
  Administered 2012-02-24: 4 mg via INTRAVENOUS

## 2012-02-24 MED ORDER — HEPARIN SODIUM (PORCINE) 1000 UNIT/ML IJ SOLN
INTRAMUSCULAR | Status: DC | PRN
  Administered 2012-02-24: 500 [IU] via INTRAVENOUS
  Administered 2012-02-24: 1000 [IU] via INTRAVENOUS
  Administered 2012-02-24: 2000 [IU] via INTRAVENOUS

## 2012-02-24 MED ORDER — NIMODIPINE 30 MG PO CAPS
ORAL_CAPSULE | ORAL | Status: AC
  Administered 2012-02-24: 60 mg via ORAL
  Filled 2012-02-24: qty 2

## 2012-02-24 MED ORDER — NEOSTIGMINE METHYLSULFATE 1 MG/ML IJ SOLN
INTRAMUSCULAR | Status: DC | PRN
  Administered 2012-02-24: 4 mg via INTRAVENOUS

## 2012-02-24 MED ORDER — SODIUM CHLORIDE 0.9 % IV SOLN
10.0000 mg | INTRAVENOUS | Status: DC | PRN
  Administered 2012-02-24: 10 ug/min via INTRAVENOUS

## 2012-02-24 MED ORDER — ASPIRIN EC 325 MG PO TBEC
325.0000 mg | DELAYED_RELEASE_TABLET | ORAL | Status: AC
  Administered 2012-02-24: 325 mg via ORAL

## 2012-02-24 MED ORDER — SODIUM CHLORIDE 0.9 % IV SOLN: INTRAVENOUS | Status: AC

## 2012-02-24 NOTE — Progress Notes (Signed)
Right groin CDI

## 2012-02-24 NOTE — Telephone Encounter (Signed)
I received a call from Romie Minus at the Pre-service center  [phone]).  She left a VM stating that she need ed to know what ACOM meant.  She also stated that she was getting the Precert for Dr. Arlean Hopping inpt.  I called her back.  I advised her that the procedure had to be pushed from yesterday to today due to an emergency case.  I advised her that the pre-cert had been pre-certed by [facility] on 01-31-12 and scanned in.  She then stated what does scanned in mean.  I walked her through the process of how to see the document even telling her how it was titled.  I also advised her that Emelda Brothers could show her where to look.  She stated he wasn't in her dept.  And she knew nothing about scanned in documents.  I advised her that the pt is here on an outpatient 23 hour observation.  I have not been told anything about the pt being an inpt.  Jonathon Bellows handles Dr. Arlean Hopping at [facility].  I fill out the form and fax it to her get the pre-cert.

## 2012-02-24 NOTE — Progress Notes (Signed)
Pt requests not to take Nimodipie at present;wants to hold off for a little while

## 2012-02-24 NOTE — Anesthesia Postprocedure Evaluation (Signed)
  Anesthesia Post-op Note  Patient: [name]  Procedure(s) Performed: Procedure(s) (LRB) with comments: RADIOLOGY WITH ANESTHESIA (N/A) - COILING AND POSSIBLE STENTS   Patient Location: PACU  Anesthesia Type: General  Level of Consciousness: awake and alert   Airway and Oxygen Therapy: Patient Spontanous Breathing  Post-op Pain: mild  Post-op Assessment: Post-op Vital signs reviewed, Patient's Cardiovascular Status Stable, Respiratory Function Stable, Patent Airway, No signs of Nausea or vomiting and Pain level controlled  Post-op Vital Signs: stable  Complications: No apparent anesthesia complications

## 2012-02-24 NOTE — Preoperative (Signed)
Beta Blockers   Reason not to administer Beta Blockers:Not Applicable 

## 2012-02-24 NOTE — Procedures (Signed)
S/P lt common carotid arteriogram followed attempted staged embolization of Acom aneurysm

## 2012-02-24 NOTE — ED Notes (Signed)
Anesthesia Present for sedation.

## 2012-02-24 NOTE — Progress Notes (Signed)
Dr.Deveshwar called--updated on pt status. Orders received--pt may sit up after 4hrs and stand/walk after 6hrs, and if doing well may go home. Will cont to monitor.

## 2012-02-24 NOTE — H&P (Signed)
[name] is an 66 y.o. female.   Chief Complaint: pt with previously treated cerebral aneurysms Anterior Comm Artery aneurysm coil 2008; R Middle Cerebral artery aneurysm coil 2008; stent 2012 Cerebral arteriogram 7/13 reveals A COM neck remnant Now scheduled for A COM neck stent placement  HPI: HTN; CKD; DM; SLE  Past Medical History  Diagnosis Date  . Hypertension   . Chronic kidney disease   . Headache   . Diabetes mellitus   . Systemic lupus erythematosus   . Complication of anesthesia     "take a long time to awaken "  . Sleep apnea     does not use cpap  . Heart murmur   . Cerebral aneurysm   . Arthritis     Past Surgical History  Procedure Date  . Cerebral aneurysm repair 2008    A COM  . Cerebral aneurysm repair 2008    R MCA  . Eye surgery     cataract  . Cervical spine surgery   . Cardiac catheterization 08    stents  . Aneurysm coiling 3/15  . Back surgery   . Bartholin cyst marsupialization     No family history on file. Social History:  reports that she has never smoked. She does not have any smokeless tobacco history on file. She reports that she does not drink alcohol or use illicit drugs.  Allergies:  Allergies  Allergen Reactions  . Sulfa Antibiotics Itching     (Not in a hospital admission)  Results for orders placed during the hospital encounter of 02/23/12 (from the past 48 hour(s))  GLUCOSE, CAPILLARY     Status: Abnormal   Collection Time   02/23/12  7:03 AM      Component Value Range Comment   Glucose-Capillary 119 (*) 70 - 99 mg/dL   GLUCOSE, CAPILLARY     Status: Abnormal   Collection Time   02/23/12  9:02 AM      Component Value Range Comment   Glucose-Capillary 115 (*) 70 - 99 mg/dL    No results found.  Review of Systems  Constitutional: Negative for fever and chills.  Eyes: Negative for blurred vision.  Respiratory: Negative for cough and shortness of breath.   Cardiovascular: Negative for chest pain.    Gastrointestinal: Negative for nausea, vomiting and abdominal pain.  Neurological: Positive for headaches. Negative for dizziness, tingling and tremors.    There were no vitals taken for this visit. Physical Exam  Constitutional: She is oriented to person, place, and time. She appears well-developed and well-nourished.  Eyes: EOM are normal.  Neck: Normal range of motion.  Cardiovascular: Normal rate and regular rhythm.   No murmur heard. Respiratory: Effort normal and breath sounds normal. She has no wheezes.  GI: Soft. There is no tenderness.  Musculoskeletal: Normal range of motion.  Neurological: She is alert and oriented to person, place, and time. No cranial nerve deficit. Coordination normal.  Skin: Skin is warm and dry.  Psychiatric: She has a normal mood and affect. Her behavior is normal. Judgment and thought content normal.     Assessment/Plan Previous treated cerebral aneurysms Scheduled now for A COM neck remnant stent placement Pt aware of procedure benefits and risks and agreeable to proceed. Consent signed If intervention performed pt will be admitted to Neuro ICU overnight  [name] A 02/24/2012, 8:04 AM

## 2012-02-24 NOTE — Progress Notes (Signed)
RIGHT GROIN CDI

## 2012-02-24 NOTE — Transfer of Care (Signed)
Immediate Anesthesia Transfer of Care Note  Patient: [name]  Procedure(s) Performed: Procedure(s) (LRB) with comments: RADIOLOGY WITH ANESTHESIA (N/A) - COILING AND POSSIBLE STENTS   Patient Location: PACU  Anesthesia Type: General  Level of Consciousness: awake, alert  and oriented  Airway & Oxygen Therapy: Patient Spontanous Breathing and Patient connected to face mask oxygen  Post-op Assessment: Report given to PACU RN, Post -op Vital signs reviewed and stable and Patient moving all extremities X 4  Post vital signs: Reviewed and stable  Complications: No apparent anesthesia complications

## 2012-02-24 NOTE — Progress Notes (Signed)
  Subjective: Post procedure  More awake alert . Denies any N/V,visual,motor,sensory or speech difficulties.. Tolerating ice chips. Denies any chest pains or SOB  Objective: Vital signs in last 24 hours: Temp:  [96.9 F (36.1 C)-98 F (36.7 C)] 97 F (36.1 C) (09/05 1410) Pulse Rate:  [73-91] 84  (09/05 1339) Resp:  [12-19] 19  (09/05 1339) BP: (112-159)/(63-98) 112/63 mmHg (09/05 1332) SpO2:  [92 %-99 %] 95 % (09/05 1339) Arterial Line BP: (150)/(69) 150/69 mmHg (09/05 1339)    Intake/Output from previous day:   Intake/Output this shift: Total I/O In: 2300 [I.V.:2300] Out: 725 [Urine:675; Blood:50]   VS  BP 130s/70s HR 50 to 70s SR  PaO2 >95 % on RA  Neurologically. Alert, awake oriented to time ,place and space.Marland Kitchen  Speech and comprehension clear.Marland Kitchen  PEARLA..66mm R =L   EOMS full.[place]i [place].to confrotation.  No Facial asymmetry.  Tongue Midline..  Motor..           NO drift of outstretched arms..          Power.5/5 Proximally and distally all four extremities..  Fine motor and coordination to finger to nose equal..  Gait Not tested  Romberg Not tested  Heel to toe. R = L RT groin Soft Pulses 2+    Lab Results:   Basename 02/24/12 0929  WBC 5.9  HGB 12.0  HCT 35.6*  PLT 173   BMET  Basename 02/24/12 0929  NA 141  K 3.7  CL 108  CO2 25  GLUCOSE 176*  BUN 30*  CREATININE 1.31*  CALCIUM 9.0   PT/INR  Basename 02/24/12 0929  LABPROT 13.4  INR 1.00   ABG No results found for this basename: PHART:2,PCO2:2,PO2:2,HCO3:2 in the last 72 hours  Studies/Results: No results found.  Anti-infectives: Anti-infectives    None      Assessment/Plan: s/p   Lt common Carotid arteriogram,attempted staged embolization of aneurysm.Marland Kitchen  Plan  Advance diet as tolterated. Resume home meds  RTC in 2 weeks D/W patient. Yoshiharu Brassell K 02/24/2012

## 2012-02-24 NOTE — Progress Notes (Signed)
RIGHT CDI

## 2012-02-24 NOTE — Anesthesia Procedure Notes (Signed)
Procedure Name: Intubation Date/Time: 02/24/2012 9:56 AM Performed by: Kyung Rudd Pre-anesthesia Checklist: Patient identified, Emergency Drugs available, Suction available, Patient being monitored and Timeout performed Patient Re-evaluated:Patient Re-evaluated prior to inductionOxygen Delivery Method: Circle system utilized Preoxygenation: Pre-oxygenation with 100% oxygen Intubation Type: IV induction Ventilation: Mask ventilation without difficulty Laryngoscope Size: Mac and 3 Grade View: Grade I Tube type: Oral Tube size: 7.5 mm Number of attempts: 1 Airway Equipment and Method: Stylet Placement Confirmation: ETT inserted through vocal cords under direct vision,  positive ETCO2 and breath sounds checked- equal and bilateral Secured at: 21 cm Tube secured with: Tape Dental Injury: Teeth and Oropharynx as per pre-operative assessment

## 2012-02-25 ENCOUNTER — Other Ambulatory Visit (HOSPITAL_COMMUNITY): Payer: Self-pay | Admitting: Interventional Radiology

## 2012-02-25 DIAGNOSIS — I729 Aneurysm of unspecified site: Secondary | ICD-10-CM

## 2012-02-28 ENCOUNTER — Telehealth (HOSPITAL_COMMUNITY): Payer: Self-pay | Admitting: *Deleted

## 2012-03-09 ENCOUNTER — Telehealth (HOSPITAL_COMMUNITY): Payer: Self-pay

## 2012-03-09 ENCOUNTER — Ambulatory Visit (HOSPITAL_COMMUNITY): Payer: PRIVATE HEALTH INSURANCE | Attending: Interventional Radiology

## 2012-03-09 NOTE — Telephone Encounter (Signed)
Left a vm for pt in regards to her f/u apt. Asking her to call the office to reschedule

## 2012-08-02 ENCOUNTER — Emergency Department: Payer: Self-pay

## 2012-08-02 LAB — COMPREHENSIVE METABOLIC PANEL
Albumin: 3.7 g/dL (ref 3.4–5.0)
Alkaline Phosphatase: 72 U/L (ref 50–136)
Anion Gap: 9 (ref 7–16)
BUN: 19 mg/dL — ABNORMAL HIGH (ref 7–18)
Bilirubin,Total: 1.1 mg/dL — ABNORMAL HIGH (ref 0.2–1.0)
Calcium, Total: 8.8 mg/dL (ref 8.5–10.1)
Chloride: 114 mmol/L — ABNORMAL HIGH (ref 98–107)
Co2: 23 mmol/L (ref 21–32)
Creatinine: 1.04 mg/dL (ref 0.60–1.30)
EGFR (African American): >60
EGFR (Non-African Amer.): 56 — ABNORMAL LOW
Glucose: 93 mg/dL (ref 65–99)
Osmolality: 293 (ref 275–301)
Potassium: 4.4 mmol/L (ref 3.5–5.1)
SGOT(AST): 26 U/L (ref 15–37)
SGPT (ALT): 23 U/L (ref 12–78)
Sodium: 146 mmol/L — ABNORMAL HIGH (ref 136–145)
Total Protein: 7.1 g/dL (ref 6.4–8.2)

## 2012-08-02 LAB — CBC
HCT: 39.5 % (ref 35.0–47.0)
HGB: 13.2 g/dL (ref 12.0–16.0)
MCH: 30.9 pg (ref 26.0–34.0)
MCHC: 33.5 g/dL (ref 32.0–36.0)
MCV: 92 fL (ref 80–100)
Platelet: 191 10*3/uL (ref 150–440)
RBC: 4.29 10*6/uL (ref 3.80–5.20)
RDW: 13.9 % (ref 11.5–14.5)
WBC: 7.5 10*3/uL (ref 3.6–11.0)

## 2012-08-02 LAB — CK TOTAL AND CKMB (NOT AT ARMC)
CK, Total: 263 U/L — ABNORMAL HIGH (ref 21–215)
CK-MB: 2.4 ng/mL (ref 0.5–3.6)

## 2012-08-02 LAB — TROPONIN I: Troponin-I: <0.02 ng/mL

## 2013-02-01 ENCOUNTER — Ambulatory Visit: Payer: Self-pay | Admitting: Internal Medicine

## 2013-02-19 ENCOUNTER — Ambulatory Visit: Payer: Self-pay | Admitting: Internal Medicine

## 2013-05-30 ENCOUNTER — Observation Stay: Payer: Self-pay | Admitting: Internal Medicine

## 2013-05-30 LAB — CK TOTAL AND CKMB (NOT AT ARMC)
CK, Total: 225 U/L — ABNORMAL HIGH (ref 21–215)
CK, Total: 233 U/L — ABNORMAL HIGH (ref 21–215)
CK-MB: 1.8 ng/mL (ref 0.5–3.6)
CK-MB: 1.6 ng/mL (ref 0.5–3.6)

## 2013-05-30 LAB — CBC
HCT: 36.4 % (ref 35.0–47.0)
HGB: 12.4 g/dL (ref 12.0–16.0)
MCH: 31.4 pg (ref 26.0–34.0)
MCHC: 34.2 g/dL (ref 32.0–36.0)
MCV: 92 fL (ref 80–100)
Platelet: 181 10*3/uL (ref 150–440)
RBC: 3.96 10*6/uL (ref 3.80–5.20)
RDW: 13.1 % (ref 11.5–14.5)
WBC: 6.4 10*3/uL (ref 3.6–11.0)

## 2013-05-30 LAB — BASIC METABOLIC PANEL
Anion Gap: 8 (ref 7–16)
BUN: 17 mg/dL (ref 7–18)
Calcium, Total: 6.9 mg/dL — CL (ref 8.5–10.1)
Chloride: 118 mmol/L — ABNORMAL HIGH (ref 98–107)
Co2: 22 mmol/L (ref 21–32)
Creatinine: 0.93 mg/dL (ref 0.60–1.30)
EGFR (African American): >60
EGFR (Non-African Amer.): >60
Glucose: 95 mg/dL (ref 65–99)
Osmolality: 296 (ref 275–301)
Potassium: 2.9 mmol/L — ABNORMAL LOW (ref 3.5–5.1)
Sodium: 148 mmol/L — ABNORMAL HIGH (ref 136–145)

## 2013-05-30 LAB — APTT: Activated PTT: 29.4 secs (ref 23.6–35.9)

## 2013-05-30 LAB — TROPONIN I
Troponin-I: <0.02 ng/mL
Troponin-I: <0.02 ng/mL

## 2013-05-31 HISTORY — PX: CARDIAC CATHETERIZATION: SHX172

## 2013-05-31 LAB — TSH: Thyroid Stimulating Horm: 2 u[IU]/mL

## 2013-05-31 LAB — CBC WITH DIFFERENTIAL/PLATELET
Basophil #: 0.1 10*3/uL (ref 0.0–0.1)
Basophil %: 0.8 %
Eosinophil #: 0.2 10*3/uL (ref 0.0–0.7)
Eosinophil %: 2.9 %
HCT: 33.8 % — ABNORMAL LOW (ref 35.0–47.0)
HGB: 11.5 g/dL — ABNORMAL LOW (ref 12.0–16.0)
Lymphocyte #: 3.1 10*3/uL (ref 1.0–3.6)
Lymphocyte %: 49.1 %
MCH: 31 pg (ref 26.0–34.0)
MCHC: 34 g/dL (ref 32.0–36.0)
MCV: 91 fL (ref 80–100)
Monocyte #: 0.4 x10 3/mm (ref 0.2–0.9)
Monocyte %: 5.8 %
Neutrophil #: 2.6 10*3/uL (ref 1.4–6.5)
Neutrophil %: 41.4 %
Platelet: 173 10*3/uL (ref 150–440)
RBC: 3.7 10*6/uL — ABNORMAL LOW (ref 3.80–5.20)
RDW: 13.5 % (ref 11.5–14.5)
WBC: 6.3 10*3/uL (ref 3.6–11.0)

## 2013-05-31 LAB — BASIC METABOLIC PANEL
Anion Gap: 4 — ABNORMAL LOW (ref 7–16)
BUN: 20 mg/dL — ABNORMAL HIGH (ref 7–18)
Calcium, Total: 8.7 mg/dL (ref 8.5–10.1)
Chloride: 112 mmol/L — ABNORMAL HIGH (ref 98–107)
Co2: 30 mmol/L (ref 21–32)
Creatinine: 1.3 mg/dL (ref 0.60–1.30)
EGFR (African American): 50 — ABNORMAL LOW
EGFR (Non-African Amer.): 43 — ABNORMAL LOW
Glucose: 180 mg/dL — ABNORMAL HIGH (ref 65–99)
Osmolality: 298 (ref 275–301)
Potassium: 3.8 mmol/L (ref 3.5–5.1)
Sodium: 146 mmol/L — ABNORMAL HIGH (ref 136–145)

## 2013-05-31 LAB — LIPID PANEL
Cholesterol: 193 mg/dL (ref 0–200)
HDL Cholesterol: 39 mg/dL — ABNORMAL LOW (ref 40–60)
Ldl Cholesterol, Calc: 133 mg/dL — ABNORMAL HIGH (ref 0–100)
Triglycerides: 105 mg/dL (ref 0–200)
VLDL Cholesterol, Calc: 21 mg/dL (ref 5–40)

## 2013-05-31 LAB — TROPONIN I: Troponin-I: <0.02 ng/mL

## 2013-05-31 LAB — MAGNESIUM: Magnesium: 1.9 mg/dL

## 2013-05-31 LAB — APTT
Activated PTT: >160 secs (ref 23.6–35.9)
Activated PTT: 76.8 secs — ABNORMAL HIGH (ref 23.6–35.9)

## 2013-05-31 LAB — CK TOTAL AND CKMB (NOT AT ARMC)
CK, Total: 205 U/L (ref 21–215)
CK-MB: 1.6 ng/mL (ref 0.5–3.6)

## 2013-05-31 LAB — HEMOGLOBIN A1C: Hemoglobin A1C: 6.5 % — ABNORMAL HIGH (ref 4.2–6.3)

## 2013-07-06 ENCOUNTER — Ambulatory Visit: Payer: Self-pay | Admitting: Internal Medicine

## 2013-08-19 HISTORY — PX: ANEURYSM COILING: SHX5349

## 2014-05-05 IMAGING — US US  BREAST BX W/ LOC DEV 1ST LESION IMG BX SPEC US GUIDE*R*
1 series · 14 of 18 positions shown · non-contrast
Comparison: none

REASON FOR EXAM: rt breast mass
COMMENTS:

PROCEDURE:     US  - US GUIDED BIOPSY BREAST RIGHT  - January 25, 2012  [DATE]
RESULT:     History: Right breast cyst.

[Series 1: us breast bx w/ loc dev 1st lesion img bx spec us  · 0.08mm/px · 14 of 18 slices shown]
[im 1/18]
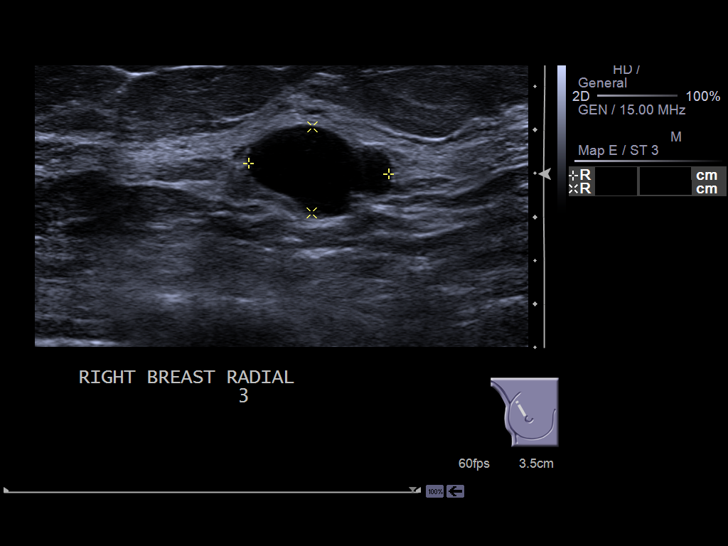
[im 2/18]
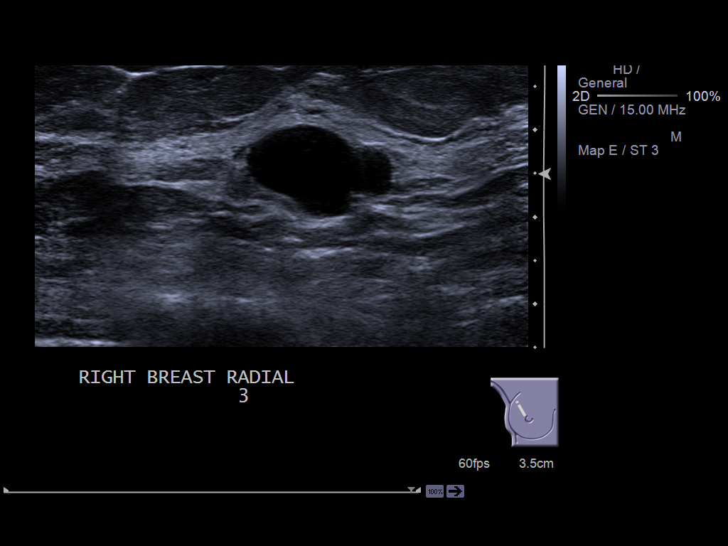
[im 4/18]
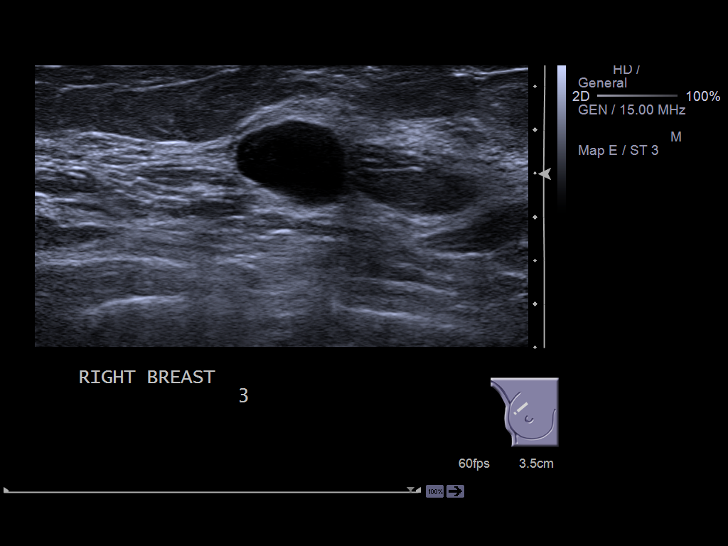
[im 5/18]
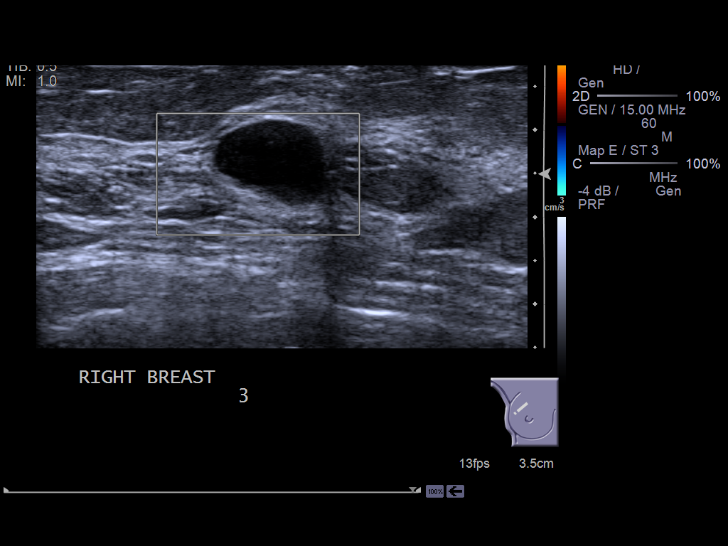
[im 6/18]
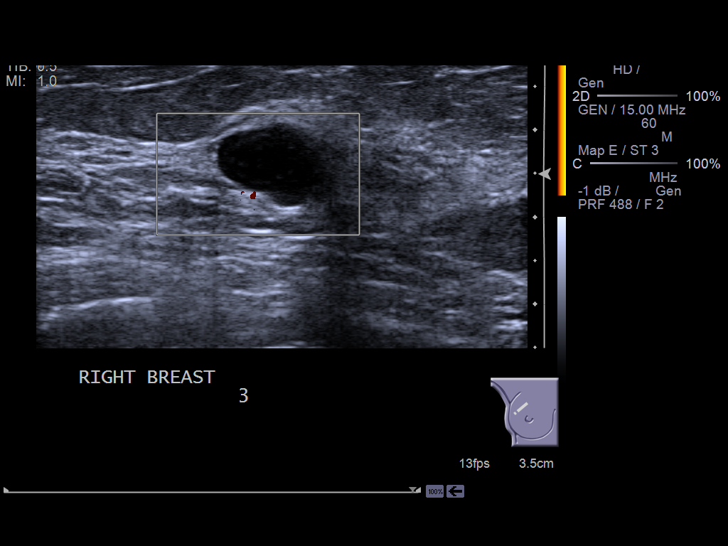
[im 8/18]
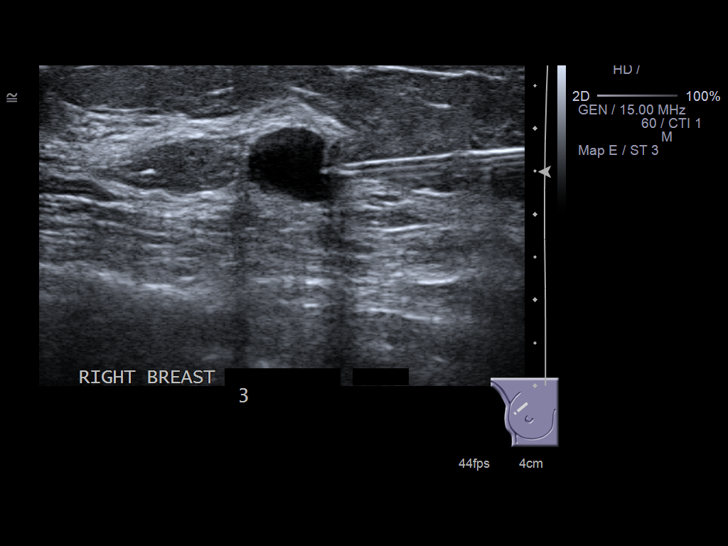
[im 9/18]
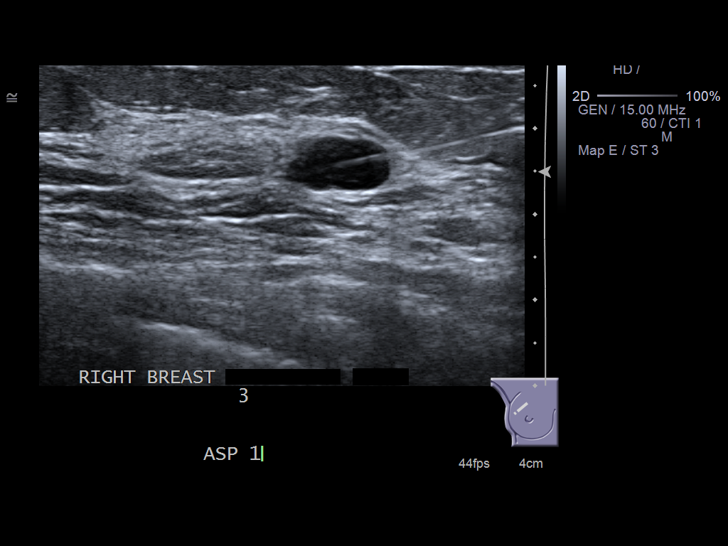
[im 10/18]
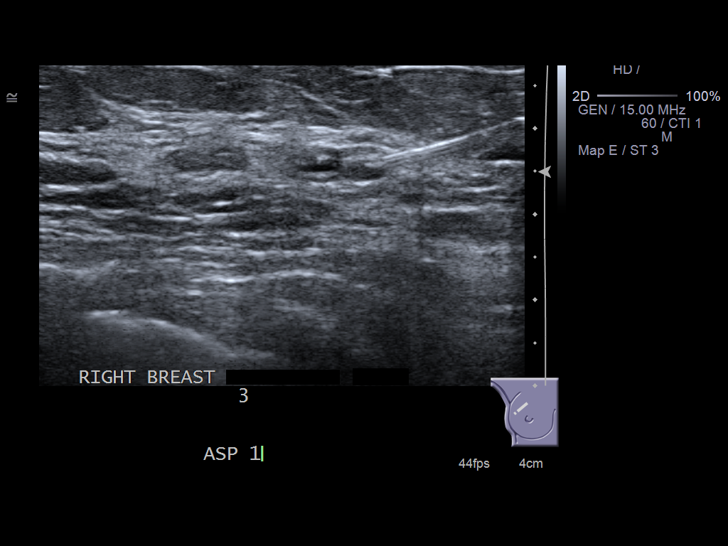
[im 11/18]
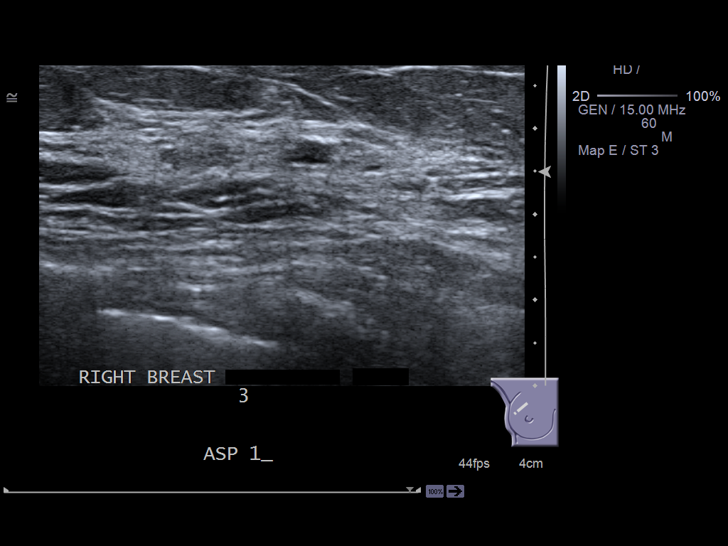
[im 13/18]
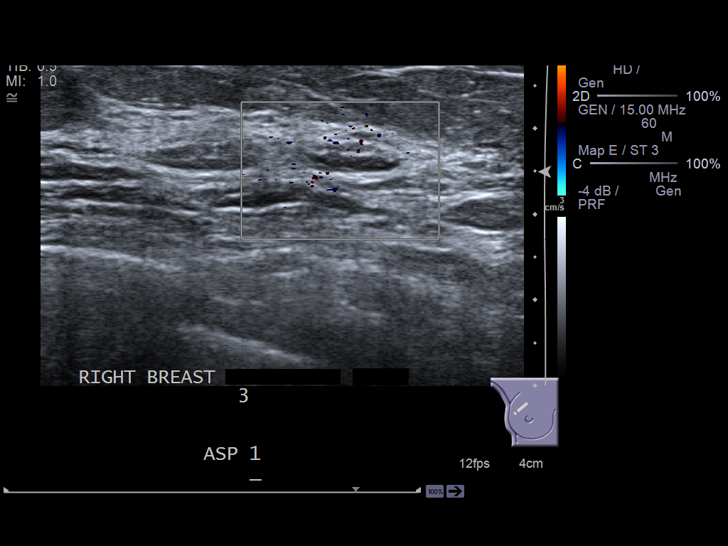
[im 14/18]
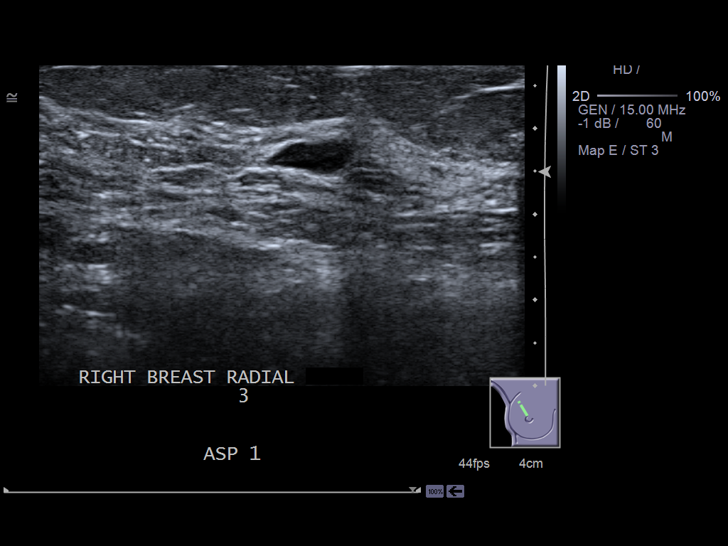
[im 15/18]
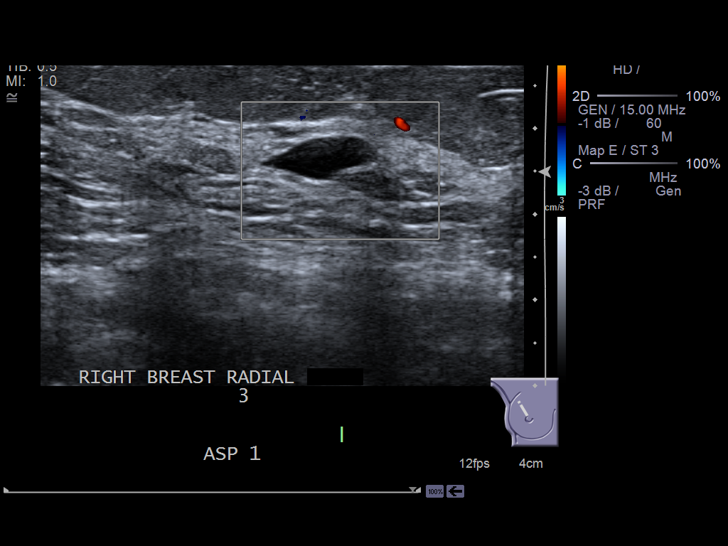
[im 17/18]
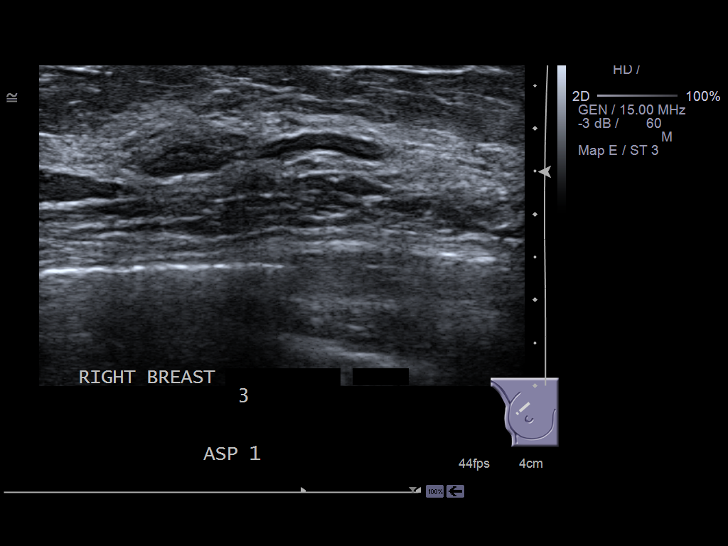
[im 18/18]
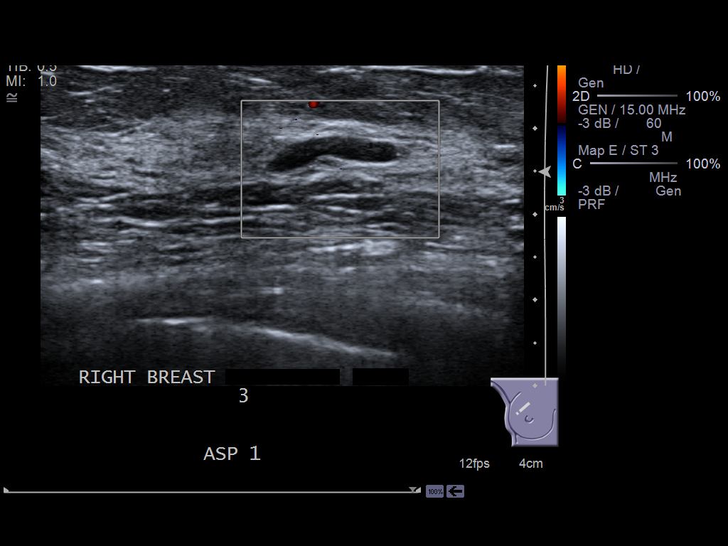

[14 of 18 positions shown; findings below may reference images not displayed]

FINDINGS: After discussing the risk and benefits of this procedure with the
patient informed consent was obtained. Breast sterilely prepped and draped.
With ultrasound guidance and local anesthesia with 1% lidocaine, a 22-gauge
spinal needle advanced into the largest right breast cyst and approximately
2 cc of clear yellow fluid removed. This sample sent to cytology.
IMPRESSION: Successful right breast cyst aspiration.

## 2014-05-06 ENCOUNTER — Emergency Department: Payer: Self-pay | Admitting: Emergency Medicine

## 2014-05-06 LAB — CBC
HCT: 38.6 % (ref 35.0–47.0)
HGB: 12.7 g/dL (ref 12.0–16.0)
MCH: 30.9 pg (ref 26.0–34.0)
MCHC: 32.9 g/dL (ref 32.0–36.0)
MCV: 94 fL (ref 80–100)
Platelet: 187 10*3/uL (ref 150–440)
RBC: 4.11 10*6/uL (ref 3.80–5.20)
RDW: 12.6 % (ref 11.5–14.5)
WBC: 7.2 10*3/uL (ref 3.6–11.0)

## 2014-05-06 LAB — COMPREHENSIVE METABOLIC PANEL
Albumin: 3.6 g/dL (ref 3.4–5.0)
Alkaline Phosphatase: 71 U/L
Anion Gap: 5 — ABNORMAL LOW (ref 7–16)
BUN: 30 mg/dL — ABNORMAL HIGH (ref 7–18)
Bilirubin,Total: 0.7 mg/dL (ref 0.2–1.0)
Calcium, Total: 9.3 mg/dL (ref 8.5–10.1)
Chloride: 111 mmol/L — ABNORMAL HIGH (ref 98–107)
Co2: 32 mmol/L (ref 21–32)
Creatinine: 1.51 mg/dL — ABNORMAL HIGH (ref 0.60–1.30)
EGFR (African American): 44 — ABNORMAL LOW
EGFR (Non-African Amer.): 37 — ABNORMAL LOW
Glucose: 88 mg/dL (ref 65–99)
Osmolality: 300 (ref 275–301)
Potassium: 3.8 mmol/L (ref 3.5–5.1)
SGOT(AST): 23 U/L (ref 15–37)
SGPT (ALT): 26 U/L
Sodium: 148 mmol/L — ABNORMAL HIGH (ref 136–145)
Total Protein: 7.6 g/dL (ref 6.4–8.2)

## 2014-05-06 LAB — CK TOTAL AND CKMB (NOT AT ARMC)
CK, Total: 349 U/L — ABNORMAL HIGH
CK-MB: 3.3 ng/mL (ref 0.5–3.6)

## 2014-05-06 LAB — TROPONIN I: Troponin-I: <0.02 ng/mL

## 2014-05-07 LAB — TROPONIN I: Troponin-I: <0.02 ng/mL

## 2014-07-04 ENCOUNTER — Encounter (HOSPITAL_COMMUNITY): Payer: Self-pay | Admitting: Interventional Radiology

## 2014-07-26 ENCOUNTER — Ambulatory Visit: Payer: Self-pay | Admitting: Internal Medicine

## 2014-08-09 ENCOUNTER — Ambulatory Visit: Payer: Self-pay | Admitting: Internal Medicine

## 2014-08-30 ENCOUNTER — Ambulatory Visit: Payer: Self-pay | Admitting: Internal Medicine

## 2014-10-11 NOTE — H&P (Signed)
PATIENT NAME:  [name], [name] MR#:  [id number] DATE OF BIRTH:  1945-12-28  DATE OF ADMISSION:  05/30/2013  REASON FOR ADMISSION: Chest pain.   PRIMARY CARE PHYSICIAN: Perrin Maltese, MD  CARDIOLOGIST: Allyne Gee, MD   CHIEF COMPLAINT: Chest pain.   HISTORY OF PRESENT ILLNESS:  This is very nice a 69 year old female with history of coronary artery disease, who had stents placed in 2008. She also has diabetes, hypertension, hyperlipidemia and lupus. The patient comes today with a history of chest pain since Sunday. There was pain with radiation to the left arm, shooting pain, on and off on until today. Today, the patient had significant episode of shortness of breath when she was going upstairs. She got really clammy, not sweaty but clammy, just cold and had chest pain 3 out of 10, heaviness and pressure-like as described. She had significant nausea for which she decided to come to the ER. In the ER, she was evaluated with EKG that shows normal sinus rhythm with some T wave flattening at the level of the anterior and lateral leads as well as inferior leads, no ST elevation. The patient had normal troponin first and a very low potassium of 2.9. The patient has been admitted for evaluation and treatment of this condition and she has be seen by Dr. Humphrey Rolls already and he is going to take her for a cardiac catheterization tomorrow in the morning. The patient understands what the procedure is and she is okay having it. She understands the risks and benefits. At this moment, she is no longer having chest pain but she had significant elevation of the blood pressures up to 186/84 and 207/88. Her pulse is in the 50s and 60s.   REVIEW OF SYSTEMS: A 12-system review of systems is done.  CONSTITUTIONAL: No fever, fatigue. No weight loss or weight gains.  EYES: No double vision or blurry vision.  ENT: No difficulty swallowing. No tinnitus.  RESPIRATORY: No cough. No wheezing. No hemoptysis. Positive shortness  of breath as described in HPI.  She does not have any significant exertional dyspnea up until today.  CARDIOVASCULAR: Chest pain as mentioned above. No orthopnea, no edema. No arrhythmia.  GASTROINTESTINAL: No nausea, vomiting, abdominal pain, constipation or diarrhea.  GENITOURINARY: No dysuria, hematuria or changes in frequency.  GYNECOLOGIC: No breast masses.  ENDOCRINE: No polyuria, polydipsia, polyphagia, cold or heat intolerance.  HEMATOLOGIC AND LYMPHATIC: No anemia, easy bruising or swollen glands.  NEUROLOGIC: No numbness, tingling or weakness.  SKIN: No rashes or petechiae.   MUSCULOSKELETAL: No significant neck pain, back pain. She has surgeries in the past on her spine but right now she is not having any significant problems.  PSYCHIATRIC: No insomnia or depression.  NEUROLOGICAL: No CVAs or TIAs.    PAST MEDICAL HISTORY: 1.  Coronary artery disease.  2.  Hypertension.  3.  Hyperlipidemia.  4.  Systemic lupus diagnosed 30 years ago.  5.  Gout.  6.  History of cerebral aneurysm, status post coiling.  7.  Non-insulin-dependent, diet-controlled diabetes.   PAST SURGICAL HISTORY:  1.  Aneurysm coiling.  2.  Neck surgery.  3.  Lower back surgery.  4.  Stents in her coronary arteries.   ALLERGIES:  SULFA GIVES HER ITCHING.  SOCIAL HISTORY: Does not smoke. She drinks wine occasionally. She is divorced, she lives by herself. Her daughter lives nearby.   FAMILY HISTORY: Positive for MI in father and mother. No cancer. No diabetes.   MEDICATIONS: Ambien 5 mg  daily, Plavix 75 mg daily, ibuprofen 600 mg 4 times a day, hydroxychloroquine 200 mg once daily, hydralazine 50 mg twice daily, furosemide 20 mg once daily, Diovan 150 mg once daily, atenolol 100 mg once daily, aspirin 81 mg 2 times daily, allopurinol 100 mg 2 tablets once a day.   PHYSICAL EXAMINATION: VITAL SIGNS: Blood pressure 207/88, pulse 67, respirations around 18, afebrile, oxygen saturation 100% on room air.   GENERAL: Alert and oriented x 3. No acute distress, no respiratory distress. Hemodynamically stable.  HEENT: Pupils are equal and reactive. Extraocular movements are intact. Mucosa are moist. Anicteric sclerae. Pink conjunctivae. No oral lesions. No oropharyngeal exudates.  NECK: Supple. No JVD. No thyromegaly. No adenopathy. No carotid bruits. No rigidity.  CARDIOVASCULAR: Regular rate and rhythm. No murmurs, rubs or gallops.  LUNGS: Clear without any wheezing or crepitus. No use of accessory muscles.  ABDOMEN: Soft, nontender, nondistended. No hepatosplenomegaly. No masses. Bowel sounds are positive.  GENITAL: Deferred.  EXTREMITIES: No edema, tenderness or clubbing.  VASCULAR: Pulses +2. Capillary refill less than 3.  LYMPHATIC: Negative for lymphadenopathy in the neck.  SKIN: No rashes, petechiae or any lesions. No malar erythema.  MUSCULOSKELETAL: No significant joint abnormalities, joint effusions. No nodules on top of joints. No nodules on top of skin.  NEUROLOGIC: Cranial nerves II through XII intact. Strength is 5 out of 5 in all 4 extremities. No focal findings.  PSYCHIATRIC: No significant signs of agitation. The patient is alert, oriented x 3.   LABORATORY DATA:   Sodium is 148, potassium is 2.9, chloride 118, calcium is 6.9, glucose 95, BUN 17, troponin is 0.02. White count is 6.4, hemoglobin 12, platelet count 181.   ASSESSMENT AND PLAN: A 69 year old female with multiple medical problems, risk factors for coronary artery disease, comes with chest pain.  1.  Chest pain. The patient is admitted for evaluation of acute coronary syndrome. She is going to be kept on heparin, beta blocker. Continue her atenolol, aspirin and Plavix given. Nitroglycerin paste placed.  The patient is going to be continued on her on her cholesterol medications.  At this moment, she is hemodynamically stable. She has been evaluated by Dr. Humphrey Rolls, cardiology, who wants to do a catheterization tomorrow. Actually,  the patient is not taking a statin, for what we are going to add on atorvastatin. There is no history of allergies or side effects from this medication. The patient is going to be monitored under telemetry and cardiac enzymes are going to be cycled. Again, she is going to have a heart catheterization tomorrow since she has a good history of the pain and multiple risk factors for coronary artery disease.  2.  Hypokalemia, replace with IV potassium. She looks a little bit dry, for what we are going to give her some fluids at slow rate with potassium. We are going to check magnesium stat and replace as necessary. The patient takes Lasix and that might be the reason why her potassium is low. We are going to hold her Lasix today.  3.  Hypocalcemia, unknown reason. We are going to check PTH and start oral replacement with calcium carbonate. Check vitamin D levels.  4.  Diabetes. Continue insulin sliding scale. The patient is not taking any medications currently. Check a hemoglobin A1c. Patient states that overall her blood sugars are 140 at the most.   5.  Lupus. No signs of reactivation. Continue her hydrochloroquine.  6.  Hypertension. The patient has secondary hypertension. We are going add  nitroglycerin,  increase her hydralazine to 4 times daily and continue her valsartan and atenolol.  7.  Gastrointestinal prophylaxis with proton pump inhibitor.  8.  Deep vein thrombosis prophylaxis, the patient is on a heparin drip.   TIME SPENT: About 50 minutes with this patient, transferred care to Dr. Elijio Miles.     ____________________________ Onyx Sink, MD rsg:cs D: 05/30/2013 18:35:00 ET T: 05/30/2013 19:02:52 ET JOB#: [id number]  cc: Plain Sink, MD, <Dictator> Jamesyn Lindell America Brown MD ELECTRONICALLY SIGNED 06/11/2013 12:27

## 2014-10-12 NOTE — Consult Note (Signed)
PATIENT NAME:  [name], [name] MR#:  [id number] DATE OF BIRTH:  1945/10/07  DATE OF CONSULTATION:  05/30/2013  CONSULTING PHYSICIAN:  Dionisio David, MD  INDICATION FOR CONSULTATION: Chest pain.  HISTORY OF PRESENT ILLNESS: This is a 69 year old [place] female with past medical history of CAD, status post PCI and stenting, hypertension, hyperlipidemia, who recently had work-up for chest pain, came in with another episode of severe chest pain. She initially went to the office and was sent to the Emergency Room. The chest pain was pressure-type, associated with numbness and diaphoresis and feeling of fainting. She is still having some chest pain.   PAST MEDICAL HISTORY: History of PCI and stenting. She had a nuclear stress test which was mildly abnormal, underwent CT of coronary arteries which showed the stents were patent. This was in March of this year, but since then  she has been doing fine up until now.   SOCIAL HISTORY: Denies EtOH abuse or smoking.   FAMILY HISTORY: Positive for coronary artery disease.   PHYSICAL EXAMINATION: VITAL SIGNS: Blood pressure is 184/70, pulse 65, respirations 18. She is afebrile.  NECK: No JVD.  LUNGS: Clear.  HEART: Regular rate and rhythm. Normal S1, S2. No audible murmur.  ABDOMEN: Soft, nontender, positive bowel sounds.  EXTREMITIES: No pedal edema.  NEUROLOGIC: She appears to be intact.   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 First set of cardiac enzymes is negative.   ASSESSMENT AND PLAN: Uncontrolled hypertension. Advise increasing the hydralazine to 100 b.i.d., continue atenolol 100 mg once a day, Plavix and aspirin and start the patient on Lovenox, and admit the patient and will do cardiac catheterization in a.m.   Thank you very much for the referral.    ____________________________ Dionisio David, MD sak:jcm D: 05/30/2013 16:02:00 ET T: 05/30/2013 16:17:08  ET JOB#: [id number]  cc: Dionisio David, MD, <Dictator> Dionisio David MD ELECTRONICALLY SIGNED 07/04/2013 10:10

## 2015-04-23 ENCOUNTER — Emergency Department
Admission: EM | Admit: 2015-04-23 | Discharge: 2015-04-23 | Disposition: A | Discharge status: Home / Self Care | Payer: Medicare Other | Attending: Emergency Medicine | Admitting: Emergency Medicine

## 2015-04-23 ENCOUNTER — Emergency Department: Payer: Medicare Other

## 2015-04-23 ENCOUNTER — Encounter: Payer: Self-pay | Admitting: Emergency Medicine

## 2015-04-23 DIAGNOSIS — Z7902 Long term (current) use of antithrombotics/antiplatelets: Secondary | ICD-10-CM | POA: Diagnosis not present

## 2015-04-23 DIAGNOSIS — R0609 Other forms of dyspnea: Secondary | ICD-10-CM

## 2015-04-23 DIAGNOSIS — I519 Heart disease, unspecified: Secondary | ICD-10-CM | POA: Insufficient documentation

## 2015-04-23 DIAGNOSIS — N189 Chronic kidney disease, unspecified: Secondary | ICD-10-CM | POA: Diagnosis not present

## 2015-04-23 DIAGNOSIS — E119 Type 2 diabetes mellitus without complications: Secondary | ICD-10-CM | POA: Insufficient documentation

## 2015-04-23 DIAGNOSIS — R0600 Dyspnea, unspecified: Secondary | ICD-10-CM | POA: Insufficient documentation

## 2015-04-23 DIAGNOSIS — R079 Chest pain, unspecified: Secondary | ICD-10-CM | POA: Diagnosis present

## 2015-04-23 DIAGNOSIS — I129 Hypertensive chronic kidney disease with stage 1 through stage 4 chronic kidney disease, or unspecified chronic kidney disease: Secondary | ICD-10-CM | POA: Diagnosis not present

## 2015-04-23 DIAGNOSIS — Z79899 Other long term (current) drug therapy: Secondary | ICD-10-CM | POA: Diagnosis not present

## 2015-04-23 DIAGNOSIS — R0789 Other chest pain: Secondary | ICD-10-CM | POA: Diagnosis not present

## 2015-04-23 LAB — BASIC METABOLIC PANEL
Anion gap: 6 (ref 5–15)
BUN: 29 mg/dL — ABNORMAL HIGH (ref 6–20)
CO2: 28 mmol/L (ref 22–32)
Calcium: 9.4 mg/dL (ref 8.9–10.3)
Chloride: 108 mmol/L (ref 101–111)
Creatinine, Ser: 1.39 mg/dL — ABNORMAL HIGH (ref 0.44–1.00)
GFR calc Af Amer: 44 mL/min — ABNORMAL LOW (ref >60)
GFR calc non Af Amer: 38 mL/min — ABNORMAL LOW (ref >60)
Glucose, Bld: 161 mg/dL — ABNORMAL HIGH (ref 65–99)
Potassium: 3.5 mmol/L (ref 3.5–5.1)
Sodium: 142 mmol/L (ref 135–145)

## 2015-04-23 LAB — CBC
HCT: 37.8 % (ref 35.0–47.0)
Hemoglobin: 12.7 g/dL (ref 12.0–16.0)
MCH: 30.8 pg (ref 26.0–34.0)
MCHC: 33.4 g/dL (ref 32.0–36.0)
MCV: 92.3 fL (ref 80.0–100.0)
Platelets: 187 10*3/uL (ref 150–440)
RBC: 4.1 MIL/uL (ref 3.80–5.20)
RDW: 13.1 % (ref 11.5–14.5)
WBC: 6.6 10*3/uL (ref 3.6–11.0)

## 2015-04-23 LAB — TROPONIN I: Troponin I: <0.03 ng/mL (ref <0.031)

## 2015-04-23 NOTE — ED Provider Notes (Signed)
[facility] Emergency Department Provider Note  ____________________________________________  Time seen: 1511  I have reviewed the triage vital signs and the nursing notes.   HISTORY  Chief Complaint Chest Pain     HPI [name] is a 69 y.o. female  with a history of cardiac disease, stent placement 2008, who is been having waxing and waning and migratory chest discomfort over the past 2 days. She reports this is primarily in the left chest. She does report some discomfort into her left back and left arm. Primarily, she reports a sense of tightness in her lower chest, as though she is wearing a bra when she is not.  The patient also reports some increased shortness of breath with exertion. This is mild but she reports that she takes a little bit longer to recover when she goes up some stairs.  She denies any nausea or diaphoresis. She denies any weakness or near syncopal episodes.    Past Medical History  Diagnosis Date  . Hypertension   . Chronic kidney disease   . Headache(784.0)   . Diabetes mellitus   . Systemic lupus erythematosus ([place])   . Complication of anesthesia     "take a long time to awaken "  . Sleep apnea     does not use cpap  . Heart murmur   . Cerebral aneurysm   . Arthritis     There are no active problems to display for this patient.   Past Surgical History  Procedure Laterality Date  . Cerebral aneurysm repair  2008    A COM  . Cerebral aneurysm repair  2008    R MCA  . Eye surgery      cataract  . Cervical spine surgery    . Cardiac catheterization  08    stents  . Aneurysm coiling  3/15  . Back surgery    . Bartholin cyst marsupialization      Current Outpatient Rx  Name  Route  Sig  Dispense  Refill  . allopurinol (ZYLOPRIM) 100 MG tablet   Oral   Take 100 mg by mouth daily.         Marland Kitchen atenolol (TENORMIN) 100 MG tablet   Oral   Take 100 mg by mouth daily.         . Cholecalciferol  (VITAMIN D3) 10000 UNITS capsule   Oral   Take 10,000 Units by mouth daily.         . clopidogrel (PLAVIX) 75 MG tablet   Oral   Take 75 mg by mouth daily.         Marland Kitchen Cod Liver Oil CAPS   Oral   Take 2 capsules by mouth daily.         Marland Kitchen ezetimibe (ZETIA) 10 MG tablet   Oral   Take 10 mg by mouth daily.         . fish oil-omega-3 fatty acids 1000 MG capsule   Oral   Take 1 g by mouth 2 (two) times daily.         . Flaxseed, Linseed, (FLAX SEED OIL) 1000 MG CAPS   Oral   Take 1,000 mg by mouth every other day.         . furosemide (LASIX) 20 MG tablet   Oral   Take 20 mg by mouth daily.         . hydrALAZINE (APRESOLINE) 50 MG tablet   Oral   Take 25 mg  by mouth 2 (two) times daily.         Marland Kitchen zolpidem (AMBIEN) 5 MG tablet   Oral   Take 5 mg by mouth at bedtime as needed. For sleep           Allergies Sulfa antibiotics  History reviewed. No pertinent family history.  Social History Social History  Substance Use Topics  . Smoking status: Never Smoker   . Smokeless tobacco: None  . Alcohol Use: No    Review of Systems  Constitutional: Negative for fever. ENT: Negative for sore throat. Cardiovascular: Positive for chest pain. Respiratory: Negative for cough. Gastrointestinal: Negative for abdominal pain, vomiting and diarrhea. Genitourinary: Negative for dysuria. Musculoskeletal: No myalgias or injuries. Skin: Negative for rash. Neurological: Negative for headache or focal weakness   10-point ROS otherwise negative.  ____________________________________________   PHYSICAL EXAM:  VITAL SIGNS: ED Triage Vitals  Enc Vitals Group     BP 04/23/15 1208 170/85 mmHg     Pulse Rate 04/23/15 1208 68     Resp --      Temp 04/23/15 1208 98.1 F (36.7 C)     Temp Source 04/23/15 1208 Oral     SpO2 04/23/15 1208 96 %     Weight 04/23/15 1151 188 lb (85.276 kg)     Height 04/23/15 1151 5\' 3"  (1.6 m)     Head Cir --      Peak Flow --       Pain Score 04/23/15 1152 3     Pain Loc --      Pain Edu? --      Excl. in [place]? --     Constitutional: Very pleasant, alert and oriented. Well appearing and in no distress. ENT   Head: Normocephalic and atraumatic.   Nose: No congestion/rhinnorhea.       Mouth: No erythema, no swelling   Cardiovascular: Normal rate, regular rhythm, no murmur noted Chest wall: Mild but notable chest discomfort on palpation of her left pectoralis and in the left upper back around her scapula. Respiratory:  Normal respiratory effort, no tachypnea.    Breath sounds are clear and equal bilaterally.  Gastrointestinal: Soft and nontender. No distention.  Back: No muscle spasm, no tenderness, no CVA tenderness. Musculoskeletal: No deformity noted. Nontender with normal range of motion in all extremities.  No noted edema. Neurologic:  Normal appearing spontaneous movement in all 4 extremities. No gross focal neurologic deficits are appreciated.  Skin:  Skin is warm, dry. No rash noted. Psychiatric: Mood and affect are normal. Speech and behavior are normal.  ____________________________________________    LABS (pertinent positives/negatives)  Labs Reviewed  BASIC METABOLIC PANEL - Abnormal; Notable for the following:    Glucose, Bld 161 (*)    BUN 29 (*)    Creatinine, Ser 1.39 (*)    GFR calc non Af Amer 38 (*)    GFR calc Af Amer 44 (*)    All other components within normal limits  CBC  TROPONIN I     ____________________________________________   EKG  ED ECG REPORT I, Philip Eckersley W, the attending physician, personally viewed and interpreted this ECG.   Date: 04/23/2015  EKG Time: 11:50 AM  Rate: 71  Rhythm: Normal sinus rhythm, 1 PVC  Axis: Normal  Intervals: QRS appears normal but computer measures at 150  ST&T Change: Downward T-wave in lead 3 and slightly flat downward and aVF. Downward in lead V3 No change from prior EKG on  05/06/2014 ____________________________________________  RADIOLOGY  Chest x-ray IMPRESSION: No radiographic evidence of acute cardiopulmonary disease, with likely chronic lung changes.  ____________________________________________  ____________________________________________   INITIAL IMPRESSION / ASSESSMENT AND PLAN / ED COURSE  Pertinent labs & imaging results that were available during my care of the patient were reviewed by me and considered in my medical decision making (see chart for details).  Charming, very pleasant, 69 year old female in no acute distress. She's had symptoms for 2 days and flax and wane. Her blood tests look good, including a troponin measured at less than 0.03. Her EKG is unchanged from prior. Her heart rate and oxygen saturation level and other vital signs are all stable.  I have called and spoken with Dr. Stark Klein, the patient's cardiologist, and reviewed the patient's situation with him. He will see her tomorrow morning at 11 AM.  The patient agrees with this plan.  ____________________________________________   FINAL CLINICAL IMPRESSION(S) / ED DIAGNOSES  Final diagnoses:  Chest wall pain  Dyspnea on exertion  Cardiac disease      Ahmed Prima, MD 04/23/15 [phone]

## 2015-04-23 NOTE — ED Notes (Signed)
Pt to triage c/p chest pain that started 2 days ago. Pain mid chest and radiating to back and left arm. Pt with stent placed 2008. Pt takes plavix. Did not take aspirin

## 2015-04-23 NOTE — Discharge Instructions (Signed)
Your chest wall has some notable tenderness. Given your cardiac history, he should have further evaluation by Dr. Humphrey Rolls. We discussed this with him. See him tomorrow morning at 11 AM. Return to the emergency department if you have worsening pain, shortness of breath, or other urgent concerns.  Chest Wall Pain Chest wall pain is pain in or around the bones and muscles of your chest. Sometimes, an injury causes this pain. Sometimes, the cause may not be known. This pain may take several weeks or longer to get better. HOME CARE Pay attention to any changes in your symptoms. Take these actions to help with your pain:  Rest as told by your doctor.  Avoid activities that cause pain. Try not to use your chest, belly (abdominal), or side muscles to lift heavy things.  If directed, apply ice to the painful area:  Put ice in a plastic bag.  Place a towel between your skin and the bag.  Leave the ice on for 20 minutes, 2-3 times per day.  Take over-the-counter and prescription medicines only as told by your doctor.  Do not use tobacco products, including cigarettes, chewing tobacco, and e-cigarettes. If you need help quitting, ask your doctor.  Keep all follow-up visits as told by your doctor. This is important. GET HELP IF:  You have a fever.  Your chest pain gets worse.  You have new symptoms. GET HELP RIGHT AWAY IF:  You feel sick to your stomach (nauseous) or you throw up (vomit).  You feel sweaty or light-headed.  You have a cough with phlegm (sputum) or you cough up blood.  You are short of breath.   This information is not intended to replace advice given to you by your health care provider. Make sure you discuss any questions you have with your health care provider.   Document Released: 11/24/2007 Document Revised: 02/26/2015 Document Reviewed: 09/02/2014 Elsevier Interactive Patient Education [employer].

## 2015-09-19 ENCOUNTER — Other Ambulatory Visit: Payer: Self-pay | Admitting: Internal Medicine

## 2015-09-19 DIAGNOSIS — Z1231 Encounter for screening mammogram for malignant neoplasm of breast: Secondary | ICD-10-CM

## 2015-10-03 ENCOUNTER — Other Ambulatory Visit: Payer: Self-pay | Admitting: Internal Medicine

## 2015-10-03 ENCOUNTER — Ambulatory Visit
Admission: RE | Admit: 2015-10-03 | Discharge: 2015-10-03 | Disposition: A | Discharge status: Home / Self Care | Payer: Medicare Other | Source: Ambulatory Visit | Attending: Internal Medicine | Admitting: Internal Medicine

## 2015-10-03 DIAGNOSIS — Z1231 Encounter for screening mammogram for malignant neoplasm of breast: Secondary | ICD-10-CM | POA: Insufficient documentation

## 2015-10-10 ENCOUNTER — Other Ambulatory Visit: Payer: Self-pay | Admitting: Internal Medicine

## 2015-10-10 DIAGNOSIS — R928 Other abnormal and inconclusive findings on diagnostic imaging of breast: Secondary | ICD-10-CM

## 2015-10-17 ENCOUNTER — Ambulatory Visit
Admission: RE | Admit: 2015-10-17 | Discharge: 2015-10-17 | Disposition: A | Discharge status: Home / Self Care | Payer: Medicare Other | Source: Ambulatory Visit | Attending: Internal Medicine | Admitting: Internal Medicine

## 2015-10-17 DIAGNOSIS — N6001 Solitary cyst of right breast: Secondary | ICD-10-CM | POA: Insufficient documentation

## 2015-10-17 DIAGNOSIS — N63 Unspecified lump in breast: Secondary | ICD-10-CM | POA: Diagnosis present

## 2015-10-17 DIAGNOSIS — R928 Other abnormal and inconclusive findings on diagnostic imaging of breast: Secondary | ICD-10-CM

## 2016-02-12 ENCOUNTER — Other Ambulatory Visit: Payer: Self-pay | Admitting: Internal Medicine

## 2016-02-12 DIAGNOSIS — R109 Unspecified abdominal pain: Secondary | ICD-10-CM

## 2016-02-18 ENCOUNTER — Ambulatory Visit: Admission: RE | Admit: 2016-02-18 | Payer: Medicare Other | Source: Ambulatory Visit

## 2016-03-23 ENCOUNTER — Ambulatory Visit (INDEPENDENT_AMBULATORY_CARE_PROVIDER_SITE_OTHER): Payer: Medicare Other | Admitting: Gastroenterology

## 2016-03-23 ENCOUNTER — Encounter: Payer: Self-pay | Admitting: Gastroenterology

## 2016-03-23 VITALS — BP 136/82 | HR 82 | Temp 98.3°F | Ht 63.0 in | Wt 189.0 lb

## 2016-03-23 DIAGNOSIS — K862 Cyst of pancreas: Secondary | ICD-10-CM | POA: Diagnosis not present

## 2016-03-23 DIAGNOSIS — R109 Unspecified abdominal pain: Secondary | ICD-10-CM | POA: Diagnosis not present

## 2016-03-23 NOTE — Progress Notes (Signed)
Gastroenterology Consultation  Referring Provider:     No ref. provider found Primary Care Physician:  PROVIDER NOT [place] Primary Gastroenterologist:  Dr. Allen Norris     Reason for Consultation:     Pancreatic cyst and right-sided abdominal pain        HPI:   [name] is a 70 y.o. y/o female referred for consultation & management of Pancreatic cyst and abdominal pain by Dr. PROVIDER NOT IN SYSTEM.  This patient comes in today with abdominal pain that is in the right side of her abdomen. She reports the pain to be in the rib area all the way down to her hip. The patient states that it hurts worse when she walks around. 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 The patient is now being sent here for evaluation of this cyst. She also reports that the pain in her right side feels like a fluttering of her abdominal wall. The patient also reports it is not made any better or worse with eating or drinking. She also states that she has been having back pain that started at the same time that this front pain started.  Past Medical History:  Diagnosis Date  . Arthritis   . Cerebral aneurysm   . Chronic kidney disease   . Complication of anesthesia    "take a long time to awaken "  . Diabetes mellitus   . Headache(784.0)   . Heart murmur   . Hypertension   . Sleep apnea    does not use cpap  . Systemic lupus erythematosus ([place])     Past Surgical History:  Procedure Laterality Date  . ANEURYSM COILING  3/15  . BACK SURGERY    . BARTHOLIN CYST MARSUPIALIZATION    . BREAST BIOPSY Right 02/04/2012   neg  . BREAST CYST ASPIRATION Right 01/25/2012   FNA neg.  Marland Kitchen BREAST EXCISIONAL BIOPSY Left 06/26/2007   neg  . CARDIAC CATHETERIZATION  08   stents  . CEREBRAL ANEURYSM REPAIR  2008   A COM  . CEREBRAL ANEURYSM REPAIR  2008   R  MCA  . CERVICAL SPINE SURGERY    . EYE SURGERY     cataract    Prior to Admission medications   Medication Sig Start Date End Date Taking? Authorizing Provider  allopurinol (ZYLOPRIM) 100 MG tablet Take 100 mg by mouth daily.   Yes Historical Provider, MD  atenolol (TENORMIN) 100 MG tablet Take 100 mg by mouth daily.   Yes Historical Provider, MD  Cholecalciferol (VITAMIN D3) 10000 UNITS capsule Take 10,000 Units by mouth daily.   Yes Historical Provider, MD  clopidogrel (PLAVIX) 75 MG tablet Take 75 mg by mouth daily.   Yes Historical Provider, MD  furosemide (LASIX) 20 MG tablet Take 20 mg by mouth daily.   Yes Historical Provider, MD  glimepiride (AMARYL) 2 MG tablet Take 2 mg by mouth daily with breakfast.   Yes Historical Provider, MD  hydrALAZINE (APRESOLINE) 50 MG tablet Take 25 mg by mouth 2 (two) times daily.   Yes Historical Provider, MD  hydroxychloroquine (PLAQUENIL) 200 MG tablet TAKE 1 TABLET BY MOUTH TWICE A DAY -- NEED APPT FORE MORE REFILLS 11/13/14  Yes Historical Provider, MD  valsartan (DIOVAN) 320 MG tablet Take 320 mg  by mouth daily. 01/26/16  Yes Historical Provider, MD  zolpidem (AMBIEN) 5 MG tablet Take 5 mg by mouth at bedtime as needed. For sleep   Yes Historical Provider, MD    Family History  Problem Relation Age of Onset  . Breast cancer Paternal Aunt 10  . Breast cancer Paternal Aunt   . Heart disease Mother   . Heart disease Father   . Stroke Father   . Diabetes Sister   . Diabetes Brother      Social History  Substance Use Topics  . Smoking status: Never Smoker  . Smokeless tobacco: Never Used  . Alcohol use Yes     Comment: Occasionally     Allergies as of 03/23/2016 - Review Complete 03/23/2016  Allergen Reaction Noted  . Sulfa antibiotics Itching 01/13/2012    Review of Systems:    All systems reviewed and negative except where noted in HPI.   Physical Exam:  BP 136/82 (BP Location: Left Arm, Patient Position: Sitting)   Pulse 82    Temp 98.3 F (36.8 C) (Oral)   Ht 5\' 3"  (1.6 m)   Wt 189 lb (85.7 kg)   BMI 33.48 kg/m  No LMP recorded. Patient is postmenopausal. Psych:  Alert and cooperative. Normal mood and affect. General:   Alert,  Well-developed, well-nourished, pleasant and cooperative in NAD Head:  Normocephalic and atraumatic. Eyes:  Sclera clear, no icterus.   Conjunctiva pink. Ears:  Normal auditory acuity. Nose:  No deformity, discharge, or lesions. Mouth:  No deformity or lesions,oropharynx pink & moist. Neck:  Supple; no masses or thyromegaly. Lungs:  Respirations even and unlabored.  Clear throughout to auscultation.   No wheezes, crackles, or rhonchi. No acute distress. Heart:  Regular rate and rhythm; no murmurs, clicks, rubs, or gallops. Abdomen:  Normal bowel sounds.  No bruits.  Soft, Tender abdominal wall to 1 figure palpation while flexing the abdominal wall muscles and non-distended without masses, hepatosplenomegaly or hernias noted.  No guarding or rebound tenderness.  Positive Carnett sign.   Rectal:  Deferred.  Msk:  Symmetrical without gross deformities.  Good, equal movement & strength bilaterally. Pulses:  Normal pulses noted. Extremities:  No clubbing or edema.  No cyanosis. Neurologic:  Alert and oriented x3;  grossly normal neurologically. Skin:  Intact without significant lesions or rashes.  No jaundice. Lymph Nodes:  No significant cervical adenopathy. Psych:  Alert and cooperative. Normal mood and affect.  Imaging Studies: No results found.  Assessment and Plan:   [name] is a 70 y.o. y/o female who comes in today with clear-cut musculoskeletal abdominal wall pain which is reproducible by lifting the patient's leg 6 inches above the exam table while palpating the abdominal wall with 1 finger lightly. The patient has also been found to have a pancreatic cyst that is growing in size. The patient has been told to take Advil for her abdominal wall pain with food. The  patient will be sent for an endoscopic ultrasound. The patient has been explained the plan and agrees with it.   Note: This dictation was prepared with Dragon dictation along with smaller phrase technology. Any transcriptional errors that result from this process are unintentional.

## 2016-03-25 ENCOUNTER — Other Ambulatory Visit: Payer: Self-pay

## 2016-03-25 ENCOUNTER — Telehealth: Payer: Self-pay

## 2016-03-25 DIAGNOSIS — K862 Cyst of pancreas: Secondary | ICD-10-CM

## 2016-03-25 NOTE — Telephone Encounter (Signed)
Oncology Nurse Navigator Documentation Received referral for EUS from Dr. Allen Norris. Spoke with patient on the phone. EUS scheduled for 10/26 at [facility] with Dr. Mont Dutton. Went over instructions with her and copy of instructions and education sheet mailed to home address. Clearance to hold Plavix 5 days prior sent to Dr. Laurelyn Sickle (cardiologist) for approval. Notified [name] that once clearance was received I would notify regarding stop date for this medication.  INSTRUCTIONS FOR ENDOSCOPIC ULTRASOUND -Your procedure has been scheduled for October 26th with Dr. Mont Dutton at [facility] -The hospital will contact you to pre-register over the phone.  -To get your scheduled arrival time, please call the Endoscopy unit at  [phone] between 1-3pm on:  October 25th   -ON THE DAY OF YOU PROCEDURE:   1. If you are scheduled for a morning procedure, nothing to drink after midnight  -If you are scheduled for an afternoon procedure, you may have clear liquids until 5 hours prior  to the procedure but no carbonated drinks or broth  2. NO FOOD THE DAY OF YOUR PROCEDURE  3. You may take your heart, seizure, blood pressure, Parkinson's or breathing medications at  6am with just enough water to get your pills down  4. Do not take any oral Diabetic medications the morning of your procedure.  5. If you are a diabetic and are using insulin, please notify your prescribing physician of this  procedure as your dose may need to be altered related to not being able to eat or drink.   5. Do not take Vitamins for 5 days before your procedure     -On the day of your procedure, come to the [facility] Admitting/Registration desk (First desk on the right) at the scheduled arrival time. You MUST have someone drive you home from your procedure. You must have a responsible adult with a valid driver's license who is on site throughout your entire procedure and who can stay with you for several hours after your procedure. You  may not go home alone in a taxi, shuttle [place] or bus, as the drivers will not be responsible for you.  --If you have any questions please call me at the above contact  [name]                                                                      Phone-[phone]                                                                 [phone]  Risk Assessment Form      Patient Name: [name]                                     DOB: 12/16/1945   Is scheduled for an elective procedure.   Procedure/s: Endoscopic Ultrasound  Date: 04/15/16     Physician: Dr. Mont Dutton   Type of anesthesia: Monitored   Office contact person: Mariea Clonts RN    Fax number: [phone]     We are requesting risk assessment from: Dr. Neoma Laming   Name of medication: Plavix                      _____discontinue 7 days prior to procedure.                     ___X_discontinue 5 days prior to procedure.                     _____discontinue 7 days prior to procedure & bridge with low  weight Heparin i.e.      Date: __________________________      [id number] signature:_____________________________________________         Navigator Location: [facility]c (03/25/16 1000) Navigator Encounter Type: Telephone (03/25/16 1000) Telephone: Lahoma Crocker Call (03/25/16 1000)             Barriers/Navigation Needs: Coordination of Care (03/25/16 1000)   Interventions: Coordination of Care (03/25/16 1000)   Coordination of Care: EUS (03/25/16 1000)        Acuity: Level 2 (03/25/16 1000)   Acuity Level 2: Initial guidance, education and coordination as needed;Educational needs;Ongoing guidance and education throughout treatment as needed (03/25/16 1000)     Time Spent with Patient: 45 (03/25/16 1000)

## 2016-03-29 ENCOUNTER — Telehealth: Payer: Self-pay

## 2016-03-29 NOTE — Telephone Encounter (Signed)
  Oncology Nurse Navigator Documentation Received clearance from  Dr. Laurelyn Sickle to hold Plavix 5 days prior to EUS. Patient left voicemail regarding. She can take dose on 10/20 then stop Plavix. Instructions also sent to home address and askedi to return call for confirmation. Clearance sent to medical records. Navigator Location: [facility] (03/29/16 1000) Navigator Encounter Type: Telephone;Education (03/29/16 1000) Telephone: Outgoing Call (03/29/16 1000)                                        Time Spent with Patient: 15 (03/29/16 1000)

## 2016-04-14 ENCOUNTER — Telehealth: Payer: Self-pay

## 2016-04-14 NOTE — Telephone Encounter (Signed)
  Oncology Nurse Navigator Documentation Received call from patient asking time of arrival for EUS. Notified to call [phone] between 1300-1500 today for arrival time. Reports she stopped plavix as directed. Navigator Location: [facility] (04/14/16 1000)   )Navigator Encounter Type: Telephone;Education (04/14/16 1000)                                                    Time Spent with Patient: 15 (04/14/16 1000)

## 2016-04-15 ENCOUNTER — Ambulatory Visit
Admission: RE | Admit: 2016-04-15 | Discharge: 2016-04-15 | Disposition: A | Discharge status: Home / Self Care | Payer: Medicare Other | Source: Ambulatory Visit | Attending: Internal Medicine | Admitting: Internal Medicine

## 2016-04-15 ENCOUNTER — Ambulatory Visit: Payer: Medicare Other | Admitting: Anesthesiology

## 2016-04-15 ENCOUNTER — Encounter
Admission: RE | Disposition: A | Discharge status: Home / Self Care | Payer: Self-pay | Source: Ambulatory Visit | Attending: Internal Medicine

## 2016-04-15 ENCOUNTER — Encounter: Payer: Self-pay | Admitting: *Deleted

## 2016-04-15 DIAGNOSIS — Z7902 Long term (current) use of antithrombotics/antiplatelets: Secondary | ICD-10-CM | POA: Insufficient documentation

## 2016-04-15 DIAGNOSIS — M1990 Unspecified osteoarthritis, unspecified site: Secondary | ICD-10-CM | POA: Insufficient documentation

## 2016-04-15 DIAGNOSIS — E1122 Type 2 diabetes mellitus with diabetic chronic kidney disease: Secondary | ICD-10-CM | POA: Diagnosis not present

## 2016-04-15 DIAGNOSIS — G4730 Sleep apnea, unspecified: Secondary | ICD-10-CM | POA: Insufficient documentation

## 2016-04-15 DIAGNOSIS — Z79899 Other long term (current) drug therapy: Secondary | ICD-10-CM | POA: Diagnosis not present

## 2016-04-15 DIAGNOSIS — K862 Cyst of pancreas: Secondary | ICD-10-CM | POA: Diagnosis present

## 2016-04-15 DIAGNOSIS — I129 Hypertensive chronic kidney disease with stage 1 through stage 4 chronic kidney disease, or unspecified chronic kidney disease: Secondary | ICD-10-CM | POA: Insufficient documentation

## 2016-04-15 DIAGNOSIS — M329 Systemic lupus erythematosus, unspecified: Secondary | ICD-10-CM | POA: Diagnosis not present

## 2016-04-15 DIAGNOSIS — N189 Chronic kidney disease, unspecified: Secondary | ICD-10-CM | POA: Insufficient documentation

## 2016-04-15 HISTORY — PX: UPPER ESOPHAGEAL ENDOSCOPIC ULTRASOUND (EUS): SHX6562

## 2016-04-15 HISTORY — DX: Atherosclerotic heart disease of native coronary artery without angina pectoris: I25.10

## 2016-04-15 SURGERY — UPPER ESOPHAGEAL ENDOSCOPIC ULTRASOUND (EUS)
Anesthesia: General

## 2016-04-15 MED ORDER — CIPROFLOXACIN IN D5W 400 MG/200ML IV SOLN
400.0000 mg | Freq: Once | INTRAVENOUS | Status: AC
  Administered 2016-04-15: 400 mg via INTRAVENOUS
  Filled 2016-04-15: qty 200

## 2016-04-15 MED ORDER — MIDAZOLAM HCL 2 MG/2ML IJ SOLN
INTRAMUSCULAR | Status: DC | PRN
  Administered 2016-04-15: 1 mg via INTRAVENOUS

## 2016-04-15 MED ORDER — PROPOFOL 500 MG/50ML IV EMUL
INTRAVENOUS | Status: DC | PRN
  Administered 2016-04-15: 120 ug/kg/min via INTRAVENOUS

## 2016-04-15 MED ORDER — LIDOCAINE HCL (CARDIAC) 20 MG/ML IV SOLN
INTRAVENOUS | Status: DC | PRN
  Administered 2016-04-15: 60 mg via INTRAVENOUS

## 2016-04-15 MED ORDER — PROPOFOL 10 MG/ML IV BOLUS
INTRAVENOUS | Status: DC | PRN
  Administered 2016-04-15: 60 mg via INTRAVENOUS

## 2016-04-15 MED ORDER — SODIUM CHLORIDE 0.9 % IV SOLN
INTRAVENOUS | Status: DC
  Administered 2016-04-15: 1000 mL via INTRAVENOUS
  Administered 2016-04-15: 14:00:00 via INTRAVENOUS

## 2016-04-15 NOTE — Anesthesia Preprocedure Evaluation (Signed)
Anesthesia Evaluation  Patient identified by MRN, date of birth, ID band Patient awake    Reviewed: Allergy & Precautions, H&P , NPO status , Patient's Chart, lab work & pertinent test results  History of Anesthesia Complications (+) PROLONGED EMERGENCE and history of anesthetic complications  Airway Mallampati: III  TM Distance: <3 FB Neck ROM: limited    Dental  (+) Poor Dentition, Caps   Pulmonary neg shortness of breath, sleep apnea ,    Pulmonary exam normal breath sounds clear to auscultation       Cardiovascular Exercise Tolerance: Good hypertension, (-) angina+ CAD, + Cardiac Stents and + Peripheral Vascular Disease  (-) Past MI Normal cardiovascular exam Rhythm:regular Rate:Normal     Neuro/Psych  Headaches, negative psych ROS   GI/Hepatic negative GI ROS, Neg liver ROS,   Endo/Other  diabetes, Type 2  Renal/GU Renal disease  negative genitourinary   Musculoskeletal  (+) Arthritis ,   Abdominal   Peds  Hematology negative hematology ROS (+)   Anesthesia Other Findings Patient reports that all of her aneurysms have been coiled   Past Medical History: No date: Arthritis No date: Cerebral aneurysm x3 No date: Chronic kidney disease No date: Complication of anesthesia     Comment: "take a long time to awaken " No date: Coronary artery disease No date: Diabetes mellitus No date: Headache(784.0) No date: Heart murmur No date: Hypertension No date: Sleep apnea     Comment: does not use cpap No date: Systemic lupus erythematosus ([place])  Past Surgical History: 3/15: ANEURYSM COILING No date: BACK SURGERY No date: BARTHOLIN CYST MARSUPIALIZATION 02/04/2012: BREAST BIOPSY Right     Comment: neg 01/25/2012: BREAST CYST ASPIRATION Right     Comment: FNA neg. 06/26/2007: BREAST EXCISIONAL BIOPSY Left     Comment: neg 08: CARDIAC CATHETERIZATION     Comment: stents 2008: CEREBRAL ANEURYSM REPAIR  Comment: A COM 2008: CEREBRAL ANEURYSM REPAIR     Comment: R MCA No date: CERVICAL SPINE SURGERY No date: EYE SURGERY     Comment: cataract  BMI    Body Mass Index:  32.59 kg/m      Reproductive/Obstetrics negative OB ROS                             Anesthesia Physical Anesthesia Plan  ASA: IV  Anesthesia Plan: General   Post-op Pain Management:    Induction:   Airway Management Planned:   Additional Equipment:   Intra-op Plan:   Post-operative Plan:   Informed Consent: I have reviewed the patients History and Physical, chart, labs and discussed the procedure including the risks, benefits and alternatives for the proposed anesthesia with the patient or authorized representative who has indicated his/her understanding and acceptance.   Dental Advisory Given  Plan Discussed with: Anesthesiologist, CRNA and Surgeon  Anesthesia Plan Comments: (Patient informed that they are higher risk for complications from anesthesia during this procedure due to their medical history.  Patient voiced understanding. )        Anesthesia Quick Evaluation

## 2016-04-15 NOTE — Interval H&P Note (Signed)
History and Physical Interval Note:  04/15/2016 1:09 PM  [name]  has presented today for surgery, with the diagnosis of PANCREATIC CYST  The various methods of treatment have been discussed with the patient and family. After consideration of risks, benefits and other options for treatment, the patient has consented to  Procedure(s): UPPER ESOPHAGEAL ENDOSCOPIC ULTRASOUND (EUS) (N/A) as a surgical intervention .  The patient's history has been reviewed, patient examined, no change in status, stable for surgery.  I have reviewed the patient's chart and labs.  Questions were answered to the patient's satisfaction.     Tillie Rung

## 2016-04-15 NOTE — Progress Notes (Signed)
  Oncology Nurse Navigator Documentation EUS report routed to Dr. Allen Norris Navigator Location: [facility] (04/15/16 1500)   )Navigator Encounter Type: Diagnostic Results (04/15/16 1500)                                 Coordination of Care: EUS (04/15/16 1500)                  Time Spent with Patient: 15 (04/15/16 1500)

## 2016-04-15 NOTE — H&P (View-Only) (Signed)
Gastroenterology Consultation  Referring Provider:     No ref. provider found Primary Care Physician:  PROVIDER NOT [place] Primary Gastroenterologist:  Dr. Allen Norris     Reason for Consultation:     Pancreatic cyst and right-sided abdominal pain        HPI:   [name] is a 70 y.o. y/o female referred for consultation & management of Pancreatic cyst and abdominal pain by Dr. PROVIDER NOT IN SYSTEM.  This patient comes in today with abdominal pain that is in the right side of her abdomen. She reports the pain to be in the rib area all the way down to her hip. The patient states that it hurts worse when she walks around. 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 The patient is now being sent here for evaluation of this cyst. She also reports that the pain in her right side feels like a fluttering of her abdominal wall. The patient also reports it is not made any better or worse with eating or drinking. She also states that she has been having back pain that started at the same time that this front pain started.  Past Medical History:  Diagnosis Date  . Arthritis   . Cerebral aneurysm   . Chronic kidney disease   . Complication of anesthesia    "take a long time to awaken "  . Diabetes mellitus   . Headache(784.0)   . Heart murmur   . Hypertension   . Sleep apnea    does not use cpap  . Systemic lupus erythematosus ([place])     Past Surgical History:  Procedure Laterality Date  . ANEURYSM COILING  3/15  . BACK SURGERY    . BARTHOLIN CYST MARSUPIALIZATION    . BREAST BIOPSY Right 02/04/2012   neg  . BREAST CYST ASPIRATION Right 01/25/2012   FNA neg.  Marland Kitchen BREAST EXCISIONAL BIOPSY Left 06/26/2007   neg  . CARDIAC CATHETERIZATION  08   stents  . CEREBRAL ANEURYSM REPAIR  2008   A COM  . CEREBRAL ANEURYSM REPAIR  2008   R  MCA  . CERVICAL SPINE SURGERY    . EYE SURGERY     cataract    Prior to Admission medications   Medication Sig Start Date End Date Taking? Authorizing Provider  allopurinol (ZYLOPRIM) 100 MG tablet Take 100 mg by mouth daily.   Yes Historical Provider, MD  atenolol (TENORMIN) 100 MG tablet Take 100 mg by mouth daily.   Yes Historical Provider, MD  Cholecalciferol (VITAMIN D3) 10000 UNITS capsule Take 10,000 Units by mouth daily.   Yes Historical Provider, MD  clopidogrel (PLAVIX) 75 MG tablet Take 75 mg by mouth daily.   Yes Historical Provider, MD  furosemide (LASIX) 20 MG tablet Take 20 mg by mouth daily.   Yes Historical Provider, MD  glimepiride (AMARYL) 2 MG tablet Take 2 mg by mouth daily with breakfast.   Yes Historical Provider, MD  hydrALAZINE (APRESOLINE) 50 MG tablet Take 25 mg by mouth 2 (two) times daily.   Yes Historical Provider, MD  hydroxychloroquine (PLAQUENIL) 200 MG tablet TAKE 1 TABLET BY MOUTH TWICE A DAY -- NEED APPT FORE MORE REFILLS 11/13/14  Yes Historical Provider, MD  valsartan (DIOVAN) 320 MG tablet Take 320 mg  by mouth daily. 01/26/16  Yes Historical Provider, MD  zolpidem (AMBIEN) 5 MG tablet Take 5 mg by mouth at bedtime as needed. For sleep   Yes Historical Provider, MD    Family History  Problem Relation Age of Onset  . Breast cancer Paternal Aunt 38  . Breast cancer Paternal Aunt   . Heart disease Mother   . Heart disease Father   . Stroke Father   . Diabetes Sister   . Diabetes Brother      Social History  Substance Use Topics  . Smoking status: Never Smoker  . Smokeless tobacco: Never Used  . Alcohol use Yes     Comment: Occasionally     Allergies as of 03/23/2016 - Review Complete 03/23/2016  Allergen Reaction Noted  . Sulfa antibiotics Itching 01/13/2012    Review of Systems:    All systems reviewed and negative except where noted in HPI.   Physical Exam:  BP 136/82 (BP Location: Left Arm, Patient Position: Sitting)   Pulse 82    Temp 98.3 F (36.8 C) (Oral)   Ht 5\' 3"  (1.6 m)   Wt 189 lb (85.7 kg)   BMI 33.48 kg/m  No LMP recorded. Patient is postmenopausal. Psych:  Alert and cooperative. Normal mood and affect. General:   Alert,  Well-developed, well-nourished, pleasant and cooperative in NAD Head:  Normocephalic and atraumatic. Eyes:  Sclera clear, no icterus.   Conjunctiva pink. Ears:  Normal auditory acuity. Nose:  No deformity, discharge, or lesions. Mouth:  No deformity or lesions,oropharynx pink & moist. Neck:  Supple; no masses or thyromegaly. Lungs:  Respirations even and unlabored.  Clear throughout to auscultation.   No wheezes, crackles, or rhonchi. No acute distress. Heart:  Regular rate and rhythm; no murmurs, clicks, rubs, or gallops. Abdomen:  Normal bowel sounds.  No bruits.  Soft, Tender abdominal wall to 1 figure palpation while flexing the abdominal wall muscles and non-distended without masses, hepatosplenomegaly or hernias noted.  No guarding or rebound tenderness.  Positive Carnett sign.   Rectal:  Deferred.  Msk:  Symmetrical without gross deformities.  Good, equal movement & strength bilaterally. Pulses:  Normal pulses noted. Extremities:  No clubbing or edema.  No cyanosis. Neurologic:  Alert and oriented x3;  grossly normal neurologically. Skin:  Intact without significant lesions or rashes.  No jaundice. Lymph Nodes:  No significant cervical adenopathy. Psych:  Alert and cooperative. Normal mood and affect.  Imaging Studies: No results found.  Assessment and Plan:   [name] is a 70 y.o. y/o female who comes in today with clear-cut musculoskeletal abdominal wall pain which is reproducible by lifting the patient's leg 6 inches above the exam table while palpating the abdominal wall with 1 finger lightly. The patient has also been found to have a pancreatic cyst that is growing in size. The patient has been told to take Advil for her abdominal wall pain with food. The  patient will be sent for an endoscopic ultrasound. The patient has been explained the plan and agrees with it.   Note: This dictation was prepared with Dragon dictation along with smaller phrase technology. Any transcriptional errors that result from this process are unintentional.

## 2016-04-15 NOTE — Transfer of Care (Signed)
Immediate Anesthesia Transfer of Care Note  Patient: [name]  Procedure(s) Performed: Procedure(s): UPPER ESOPHAGEAL ENDOSCOPIC ULTRASOUND (EUS) (N/A)  Patient Location: PACU  Anesthesia Type:General  Level of Consciousness: sedated  Airway & Oxygen Therapy: Patient Spontanous Breathing and Patient connected to nasal cannula oxygen  Post-op Assessment: Report given to RN and Post -op Vital signs reviewed and stable  Post vital signs: Reviewed and stable  Last Vitals:  Vitals:   04/15/16 1257 04/15/16 1430  BP: (!) 154/70 137/79  Pulse: 65 67  Resp: 20 16  Temp: (!) 36.1 C 36.1 C    Last Pain:  Vitals:   04/15/16 1430  TempSrc: Tympanic      Patients Stated Pain Goal: 5 (75/43/60 6770)  Complications: No apparent anesthesia complications

## 2016-04-15 NOTE — Op Note (Signed)
[facility] Gastroenterology Patient Name: [name] Procedure Date: 04/15/2016 1:39 PM MRN: [id number] Account #: [id number] Date of Birth: Aug 16, 1945 Admit Type: Outpatient Age: 70 Room: [facility] ENDO ROOM 3 Gender: Female Note Status: Finalized Procedure:            Upper EUS Indications:          Pancreatic cyst on CT scan Patient Profile:      Refer to note in patient chart for documentation of                        history and physical. Providers:            Murray Hodgkins. Ailie Gage Referring MD:         Perrin Maltese, MD (Referring MD), Lucilla Lame MD, MD                        (Referring MD) Medicines:            Propofol per Anesthesia, Cipro 488 mg IV Complications:        No immediate complications. Procedure:            Pre-Anesthesia Assessment:                       Prior to the procedure, a History and Physical was                        performed, and patient medications and allergies were                        reviewed. The patient is competent. The risks and                        benefits of the procedure and the sedation options and                        risks were discussed with the patient. All questions                        were answered and informed consent was obtained.                        Patient identification and proposed procedure were                        verified by the physician, the nurse and the                        anesthetist in the pre-procedure area. Mental Status                        Examination: alert and oriented. Airway Examination:                        normal oropharyngeal airway and neck mobility.                        Respiratory Examination: clear to auscultation. CV                        Examination: normal.  Prophylactic Antibiotics: The                        patient does not require prophylactic antibiotics.                        Prior Anticoagulants: The patient has taken no previous                 anticoagulant or antiplatelet agents. ASA Grade                        Assessment: II - A patient with mild systemic disease.                        After reviewing the risks and benefits, the patient was                        deemed in satisfactory condition to undergo the                        procedure. The anesthesia plan was to use monitored                        anesthesia care (MAC). Immediately prior to                        administration of medications, the patient was                        re-assessed for adequacy to receive sedatives. The                        heart rate, respiratory rate, oxygen saturations, blood                        pressure, adequacy of pulmonary ventilation, and                        response to care were monitored throughout the                        procedure. The physical status of the patient was                        re-assessed after the procedure.                       After obtaining informed consent, the endoscope was                        passed under direct vision. Throughout the procedure,                        the patient's blood pressure, pulse, and oxygen                        saturations were monitored continuously. The Endoscope                        was introduced through the mouth, and advanced to the  duodenum for ultrasound examination from the esophagus,                        stomach and duodenum. The Endosonoscope was introduced                        through the mouth, and advanced to the second part of                        duodenum. The upper EUS was accomplished without                        difficulty. The patient tolerated the procedure well. Findings:      Endoscopic Finding :      The entire examined stomach was endoscopically normal.      The ampulla, duodenal bulb and second portion of the duodenum were       normal.      Endosonographic Finding :      An anechoic,  septated lesion suggestive of a cyst was identified in the       pancreatic head. The lesion measured 42 mm by 35 mm in maximal       cross-sectional diameter. There were a few compartments thinly septated.       The outer wall of the lesion was not seen. There was no associated mass.       There was no internal debris within the fluid-filled cavity. Diagnostic       needle aspiration for fluid was performed. Color Doppler imaging was       utilized prior to needle puncture to confirm a lack of significant       vascular structures within the needle path. Three passes were made with       the 22 gauge [employer] (1 pass) and Licensed [employer]ancer       (2 passes) needles using a transduodenal approach (standard FNA needles       were not available at the time of procedure). A stylet was used. The       amount of fluid collected was 1 mL. The fluid was viscous and thick.       Sample(s) were sent for cytology and CEA. Unable to aspirate enough       fluid for amylase.      There was no sign of significant endosonographic abnormality in the genu       of the pancreas, in the pancreatic body and in the pancreatic tail. The       pancreatic duct measured 2.8 mm in the neck, 1.1 mm in the body, and 0.9       mm in the tail.      No lymphadenopathy seen.      Endosonographic imaging in the left lobe of the liver showed no       abnormalities.      The celiac region was visualized and showed no sign of significant       endosonographic abnormality. Impression:           EGD Impressions:                       - The esophagus was not well visualized with the  duodenoscope.                       - Normal stomach.                       - Normal ampulla, duodenal bulb and second portion of                        the duodenum.                       EUS Impressions:                       - A complex cystic lesion with internal septations was                         seen in the pancreatic head. Fine needle aspiration for                        cyst fluid CEA and cytology performed. 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                       - There was no sign of significant pathology in the                        neck, body, and tail of the pancreas.                       - No lymphadenopathy seen.                       - Normal celiac region.                       - Normal visualized portions of the liver. Recommendation:       - Discharge patient to home (ambulatory).                       - Await cytology results.                       - Refer to a pancreas surgeon given clinical concern                        for a mucinous cystic lesion of the pancreas.                       - Cipro (ciprofloxacin) 500 mg PO BID for 5 days.                       - The findings and recommendations were discussed with                        the patient and her family.                       - Return to referring physician as previously scheduled. Procedure Code(s):    ---  Professional ---                       [phone], Esophagogastroduodenoscopy, flexible, transoral;                        with transendoscopic ultrasound-guided intramural or                        transmural fine needle aspiration/biopsy(s) (includes                        endoscopic ultrasound examination of the esophagus,                        stomach, and either the duodenum or a surgically                        altered stomach where the jejunum is examined distal to                        the anastomosis) Diagnosis Code(s):    --- Professional ---                       K86.2, Cyst of pancreas CPT copyright 2016 American Medical Association. All rights reserved. The codes documented in this report are preliminary and upon coder review may  be revised to meet current compliance requirements. Attending Participation:      I personally performed the entire  procedure without the assistance of a       fellow, resident or surgical assistant. [place],  04/15/2016 2:32:58 PM This report has been signed electronically. Number of Addenda: 0 Note Initiated On: 04/15/2016 1:39 PM Estimated Blood Loss: Estimated blood loss: none.      [facility]

## 2016-04-19 ENCOUNTER — Encounter: Payer: Self-pay | Admitting: Internal Medicine

## 2016-04-19 LAB — CYTOLOGY - NON PAP

## 2016-04-21 NOTE — Progress Notes (Signed)
  Oncology Nurse Navigator Documentation Cytology from EUS routed to Dr. Allen Norris Navigator Location: [facility] (04/21/16 1400)   )Navigator Encounter Type: Diagnostic Results;Letter/Fax/Email (04/21/16 1400)                                 Coordination of Care: EUS (04/21/16 1400)                  Time Spent with Patient: 15 (04/21/16 1400)

## 2016-04-21 NOTE — Anesthesia Postprocedure Evaluation (Addendum)
Anesthesia Post Note  Patient: [name]  Procedure(s) Performed: Procedure(s) (LRB): UPPER ESOPHAGEAL ENDOSCOPIC ULTRASOUND (EUS) (N/A)  Patient location during evaluation: Endoscopy Anesthesia Type: General Level of consciousness: awake and alert Pain management: pain level controlled Vital Signs Assessment: post-procedure vital signs reviewed and stable Respiratory status: spontaneous breathing, nonlabored ventilation, respiratory function stable and patient connected to nasal cannula oxygen Cardiovascular status: blood pressure returned to baseline and stable Postop Assessment: no signs of nausea or vomiting Anesthetic complications: no    Last Vitals:  Vitals:   04/15/16 1450 04/15/16 1500  BP: (!) 159/95 (!) 177/91  Pulse: 66 65  Resp: 13 20  Temp:      Last Pain:  Vitals:   04/16/16 0712  TempSrc:   PainSc: 0-No pain                 Precious Haws Safwan Tomei

## 2016-04-23 ENCOUNTER — Telehealth: Payer: Self-pay | Admitting: Gastroenterology

## 2016-04-23 NOTE — Telephone Encounter (Signed)
Patient called and stated that she had a test on 10/26.Marland Kitchen She would like to know the results and what will she be doing next?

## 2016-04-26 NOTE — Telephone Encounter (Signed)
Pt advise of EUS results.

## 2016-04-27 ENCOUNTER — Telehealth: Payer: Self-pay | Admitting: *Deleted

## 2016-04-27 NOTE — Telephone Encounter (Signed)
Patient called and states she had a procedure done and she is wondering about the results from the procedure that was done on 04/15/16. Patient states she wants to know the next step after the procedure. Patient is requesting a call back .  Her number is [phone]

## 2016-04-27 NOTE — Telephone Encounter (Signed)
Pt advised about EUS pathology results. Dr. Allen Norris will discuss next step with Dr. Mont Dutton and notify pt.

## 2016-04-30 NOTE — Telephone Encounter (Signed)
Pt notified she will be referral to a pancreatic surgeon at [facility]. She was made aware that it may take a couple of weeks to get an appt as the physician will need to review her case.

## 2016-04-30 NOTE — Telephone Encounter (Signed)
Patient needs to see a pancreatic surgeon.

## 2016-05-06 ENCOUNTER — Other Ambulatory Visit: Payer: Self-pay | Admitting: Internal Medicine

## 2016-05-06 DIAGNOSIS — R1011 Right upper quadrant pain: Secondary | ICD-10-CM

## 2016-05-06 DIAGNOSIS — S22060A Wedge compression fracture of T7-T8 vertebra, initial encounter for closed fracture: Secondary | ICD-10-CM

## 2016-05-06 DIAGNOSIS — K563 Gallstone ileus: Secondary | ICD-10-CM

## 2016-05-12 ENCOUNTER — Ambulatory Visit
Admission: RE | Admit: 2016-05-12 | Discharge: 2016-05-12 | Disposition: A | Discharge status: Home / Self Care | Payer: Medicare Other | Source: Ambulatory Visit | Attending: Internal Medicine | Admitting: Internal Medicine

## 2016-05-12 DIAGNOSIS — R1011 Right upper quadrant pain: Secondary | ICD-10-CM | POA: Diagnosis present

## 2016-05-12 DIAGNOSIS — K8020 Calculus of gallbladder without cholecystitis without obstruction: Secondary | ICD-10-CM | POA: Insufficient documentation

## 2016-05-12 DIAGNOSIS — N281 Cyst of kidney, acquired: Secondary | ICD-10-CM | POA: Insufficient documentation

## 2016-05-12 DIAGNOSIS — K563 Gallstone ileus: Secondary | ICD-10-CM

## 2016-05-19 ENCOUNTER — Ambulatory Visit
Admission: RE | Admit: 2016-05-19 | Discharge: 2016-05-19 | Disposition: A | Discharge status: Home / Self Care | Payer: Medicare Other | Source: Ambulatory Visit | Attending: Internal Medicine | Admitting: Internal Medicine

## 2016-05-19 DIAGNOSIS — M4854XA Collapsed vertebra, not elsewhere classified, thoracic region, initial encounter for fracture: Secondary | ICD-10-CM | POA: Diagnosis present

## 2016-05-19 DIAGNOSIS — M4314 Spondylolisthesis, thoracic region: Secondary | ICD-10-CM | POA: Diagnosis not present

## 2016-05-19 DIAGNOSIS — M4854XD Collapsed vertebra, not elsewhere classified, thoracic region, subsequent encounter for fracture with routine healing: Secondary | ICD-10-CM | POA: Diagnosis not present

## 2016-05-19 DIAGNOSIS — M1288 Other specific arthropathies, not elsewhere classified, other specified site: Secondary | ICD-10-CM | POA: Diagnosis not present

## 2016-05-19 DIAGNOSIS — M4804 Spinal stenosis, thoracic region: Secondary | ICD-10-CM | POA: Insufficient documentation

## 2016-05-19 DIAGNOSIS — S22060A Wedge compression fracture of T7-T8 vertebra, initial encounter for closed fracture: Secondary | ICD-10-CM

## 2016-05-27 ENCOUNTER — Ambulatory Visit: Payer: Medicare Other | Attending: Internal Medicine

## 2016-08-20 ENCOUNTER — Emergency Department
Admission: EM | Admit: 2016-08-20 | Discharge: 2016-08-20 | Disposition: A | Discharge status: Home / Self Care | Payer: Medicare Other | Attending: Emergency Medicine | Admitting: Emergency Medicine

## 2016-08-20 ENCOUNTER — Emergency Department: Payer: Medicare Other

## 2016-08-20 ENCOUNTER — Encounter: Payer: Self-pay | Admitting: Emergency Medicine

## 2016-08-20 DIAGNOSIS — I2510 Atherosclerotic heart disease of native coronary artery without angina pectoris: Secondary | ICD-10-CM | POA: Insufficient documentation

## 2016-08-20 DIAGNOSIS — Z7984 Long term (current) use of oral hypoglycemic drugs: Secondary | ICD-10-CM | POA: Diagnosis not present

## 2016-08-20 DIAGNOSIS — R531 Weakness: Secondary | ICD-10-CM | POA: Diagnosis not present

## 2016-08-20 DIAGNOSIS — Z79899 Other long term (current) drug therapy: Secondary | ICD-10-CM | POA: Diagnosis not present

## 2016-08-20 DIAGNOSIS — I129 Hypertensive chronic kidney disease with stage 1 through stage 4 chronic kidney disease, or unspecified chronic kidney disease: Secondary | ICD-10-CM | POA: Diagnosis not present

## 2016-08-20 DIAGNOSIS — E1122 Type 2 diabetes mellitus with diabetic chronic kidney disease: Secondary | ICD-10-CM | POA: Insufficient documentation

## 2016-08-20 DIAGNOSIS — N189 Chronic kidney disease, unspecified: Secondary | ICD-10-CM | POA: Diagnosis not present

## 2016-08-20 LAB — BASIC METABOLIC PANEL
Anion gap: 7 (ref 5–15)
BUN: 31 mg/dL — ABNORMAL HIGH (ref 6–20)
CO2: 27 mmol/L (ref 22–32)
Calcium: 9.3 mg/dL (ref 8.9–10.3)
Chloride: 110 mmol/L (ref 101–111)
Creatinine, Ser: 1.93 mg/dL — ABNORMAL HIGH (ref 0.44–1.00)
GFR calc Af Amer: 29 mL/min — ABNORMAL LOW (ref >60)
GFR calc non Af Amer: 25 mL/min — ABNORMAL LOW (ref >60)
Glucose, Bld: 95 mg/dL (ref 65–99)
Potassium: 4.2 mmol/L (ref 3.5–5.1)
Sodium: 144 mmol/L (ref 135–145)

## 2016-08-20 LAB — URINALYSIS, COMPLETE (UACMP) WITH MICROSCOPIC
Bacteria, UA: NONE SEEN
Bilirubin Urine: NEGATIVE
Glucose, UA: NEGATIVE mg/dL
Hgb urine dipstick: NEGATIVE
Ketones, ur: NEGATIVE mg/dL
Leukocytes, UA: NEGATIVE
Nitrite: NEGATIVE
Protein, ur: NEGATIVE mg/dL
RBC / HPF: NONE SEEN RBC/hpf (ref 0–5)
Specific Gravity, Urine: 1.011 (ref 1.005–1.030)
pH: 6 (ref 5.0–8.0)

## 2016-08-20 LAB — CBC
HCT: 35.5 % (ref 35.0–47.0)
Hemoglobin: 11.4 g/dL — ABNORMAL LOW (ref 12.0–16.0)
MCH: 30.6 pg (ref 26.0–34.0)
MCHC: 32.3 g/dL (ref 32.0–36.0)
MCV: 94.7 fL (ref 80.0–100.0)
Platelets: 196 10*3/uL (ref 150–440)
RBC: 3.75 MIL/uL — ABNORMAL LOW (ref 3.80–5.20)
RDW: 15.2 % — ABNORMAL HIGH (ref 11.5–14.5)
WBC: 5.8 10*3/uL (ref 3.6–11.0)

## 2016-08-20 LAB — TROPONIN I: Troponin I: <0.03 ng/mL (ref <0.03)

## 2016-08-20 MED ORDER — SODIUM CHLORIDE 0.9 % IV BOLUS (SEPSIS)
1000.0000 mL | Freq: Once | INTRAVENOUS | Status: AC
  Administered 2016-08-20: 1000 mL via INTRAVENOUS

## 2016-08-20 NOTE — Discharge Instructions (Signed)
You were evaluated for generalized weakness, which is now improved.  Although no certain cause was found, your exam and evaluation are overall reassuring in the emergency room today. Your kidney function was slightly decreased in your treated with a bag of fluids to help possible dehydration.  Return to the emergency department immediately for any worsening condition including chest pain, abdominal pain, vomiting blood, black or bloody stools, fever, confusion or altered mental status, one-sided weakness or numbness or slurred speech, or any other symptoms concerning to you.

## 2016-08-20 NOTE — ED Notes (Signed)
Pt states unable to give urine sample at this time 

## 2016-08-20 NOTE — ED Triage Notes (Addendum)
pt presents to ED with generalized weakness since this morning around 4am. Pt states she got up to use the restroom and reports feeling "weird". Last time filling similar she was dx with pneumonia and was admitted to hospital. Denies chest or sob. Pt states she is not in pain she "just feels funny". EMS VS BP 170/91 HR 79 97% on RA. Pt answering questions without difficulty. No distress noted.

## 2016-08-20 NOTE — ED Provider Notes (Signed)
[facility] Emergency Department Provider Note ____________________________________________   I have reviewed the triage vital signs and the triage nursing note.  HISTORY  Chief Complaint Weakness   Historian Patient  HPI [name] is a 71 y.o. 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  She is presenting today having woken up around 4 AM to go to the bathroom and felt like she was weak all over, and lightheaded like she might pass out. No palpitations or chest pain. No recent fevers. No recent vomiting or diarrhea. She states she thinks that she is drinking well. 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  States that when she was in [place] a few weeks ago she had pneumonia and says it felt like this, where she felt weak all over.  Currently she is having no headache or chest pain or abdominal pain. She is no longer feeling lightheaded.  No cough or trouble breathing or shortness of breath.    Past Medical History:  Diagnosis Date  . Arthritis   . Cerebral aneurysm   . Chronic kidney disease   . Complication of anesthesia    "take a long time to awaken "  . Coronary artery disease   . Diabetes mellitus   . Headache(784.0)   . Heart murmur   . Hypertension   . Sleep apnea    does not use cpap  . Systemic lupus erythematosus ([place])     There are no active problems to display for this patient.   Past Surgical History:  Procedure Laterality Date  . ANEURYSM COILING  3/15  . BACK SURGERY    . BARTHOLIN CYST MARSUPIALIZATION    . BREAST BIOPSY Right 02/04/2012   neg  . BREAST CYST ASPIRATION Right 01/25/2012   FNA neg.  Marland Kitchen BREAST EXCISIONAL BIOPSY Left 06/26/2007   neg  . CARDIAC CATHETERIZATION  08   stents  . CEREBRAL  ANEURYSM REPAIR  2008   A COM  . CEREBRAL ANEURYSM REPAIR  2008   R MCA  . CERVICAL SPINE SURGERY    . EYE SURGERY     cataract  . UPPER ESOPHAGEAL ENDOSCOPIC ULTRASOUND (EUS) N/A 04/15/2016   Procedure: UPPER ESOPHAGEAL ENDOSCOPIC ULTRASOUND (EUS);  Surgeon: Holly Bodily, MD;  Location: [facility] ENDOSCOPY;  Service: Gastroenterology;  Laterality: N/A;    Prior to Admission medications   Medication Sig Start Date End Date Taking? Authorizing Provider  allopurinol (ZYLOPRIM) 100 MG tablet Take 100 mg by mouth daily.   Yes Historical Provider, MD  clopidogrel (PLAVIX) 75 MG tablet Take 75 mg by mouth daily.   Yes Historical Provider, MD  furosemide (LASIX) 40 MG tablet Take 40 mg by mouth daily.    Yes Historical Provider, MD  glimepiride (AMARYL) 2 MG tablet Take 3 mg by mouth 2 (two) times daily.    Yes Historical Provider, MD  hydroxychloroquine (PLAQUENIL) 200 MG tablet TAKE 1 TABLET BY MOUTH daily 11/13/14  Yes Historical Provider, MD  labetalol (NORMODYNE) 300 MG tablet Take 300 mg by mouth 2 (two) times daily. 07/24/16  Yes Historical Provider, MD  omeprazole (PRILOSEC) 40 MG capsule Take 40 mg by mouth daily. 07/24/16  Yes Historical Provider, MD  rosuvastatin (CRESTOR) 5 MG tablet Take 5 mg by  mouth at bedtime. 07/24/16  Yes Historical Provider, MD  valsartan (DIOVAN) 320 MG tablet Take 320 mg by mouth daily. 01/26/16  Yes Historical Provider, MD  zolpidem (AMBIEN) 5 MG tablet Take 5 mg by mouth at bedtime as needed. For sleep   Yes Historical Provider, MD  atenolol (TENORMIN) 100 MG tablet Take 100 mg by mouth daily.    Historical Provider, MD  Cholecalciferol (VITAMIN D3) 10000 UNITS capsule Take 10,000 Units by mouth daily.    Historical Provider, MD  hydrALAZINE (APRESOLINE) 50 MG tablet Take 25 mg by mouth 2 (two) times daily.    Historical Provider, MD    Allergies  Allergen Reactions  . Sulfa Antibiotics Itching    Family History  Problem Relation Age of Onset  . Breast  cancer Paternal Aunt 60  . Breast cancer Paternal Aunt   . Heart disease Mother   . Heart disease Father   . Stroke Father   . Diabetes Sister   . Diabetes Brother     Social History Social History  Substance Use Topics  . Smoking status: Never Smoker  . Smokeless tobacco: Never Used  . Alcohol use Yes     Comment: Occasionally     Review of Systems  Constitutional: Negative for fever.  Eyes: Negative for visual changes. ENT: Negative for sore throat. Cardiovascular: Negative for chest pain. Respiratory: Negative for shortness of breath. Gastrointestinal: Negative for abdominal pain, vomiting and diarrhea. Genitourinary: Negative for dysuria. Musculoskeletal: Negative for back pain. Skin: Negative for rash. Neurological: Negative for headache. 10 point Review of Systems otherwise negative ____________________________________________   PHYSICAL EXAM:  VITAL SIGNS: ED Triage Vitals  Enc Vitals Group     BP 08/20/16 0527 (!) 190/105     Pulse Rate 08/20/16 0527 76     Resp 08/20/16 0527 18     Temp 08/20/16 0527 97.5 F (36.4 C)     Temp Source 08/20/16 0527 Oral     SpO2 08/20/16 0527 100 %     Weight 08/20/16 0524 180 lb (81.6 kg)     Height 08/20/16 0524 5\' 4"  (1.626 m)     Head Circumference --      Peak Flow --      Pain Score 08/20/16 0531 0     Pain Loc --      Pain Edu? --      Excl. in [place]? --      Constitutional: Alert and oriented. Well appearing and in no distress. HEENT   Head: Normocephalic and atraumatic.      Eyes: Conjunctivae are normal. PERRL. Normal extraocular movements.      Ears:         Nose: No congestion/rhinnorhea.   Mouth/Throat: Mucous membranes are moist.   Neck: No stridor. Cardiovascular/Chest: Normal rate, regular rhythm.  No murmurs, rubs, or gallops. Respiratory: Normal respiratory effort without tachypnea nor retractions. Breath sounds are clear and equal bilaterally. No  wheezes/rales/rhonchi. Gastrointestinal: Soft. No distention, no guarding, no rebound. Nontender.  Genitourinary/rectal:Deferred Musculoskeletal: Nontender with normal range of motion in all extremities. No joint effusions.  No lower extremity tenderness.  No edema. Neurologic:  Normal speech and language. No gross or focal neurologic deficits are appreciated. Skin:  Skin is warm, dry and intact. No rash noted. Psychiatric: Mood and affect are normal. Speech and behavior are normal. Patient exhibits appropriate insight and judgment.   ____________________________________________  LABS (pertinent positives/negatives)  Labs Reviewed  BASIC METABOLIC PANEL - Abnormal; Notable for the  following:       Result Value   BUN 31 (*)    Creatinine, Ser 1.93 (*)    GFR calc non Af Amer 25 (*)    GFR calc Af Amer 29 (*)    All other components within normal limits  CBC - Abnormal; Notable for the following:    RBC 3.75 (*)    Hemoglobin 11.4 (*)    RDW 15.2 (*)    All other components within normal limits  URINALYSIS, COMPLETE (UACMP) WITH MICROSCOPIC - Abnormal; Notable for the following:    Color, Urine STRAW (*)    APPearance CLEAR (*)    Squamous Epithelial / LPF 0-5 (*)    All other components within normal limits  TROPONIN I    ____________________________________________    EKG I, Lisa Roca, MD, the attending physician have personally viewed and interpreted all ECGs.  78 beats per minute. Normal sinus rhythm. Nonspecific intraventricular conduction delay. Normal axis. Nonspecific ST and T-wave ____________________________________________  RADIOLOGY All Xrays were viewed by me. Imaging interpreted by Radiologist.  Chest x-ray two-view:  IMPRESSION: Stable examination: Mild cardiomegaly and LEFT lung base atelectasis/scarring. __________________________________________  PROCEDURES  Procedure(s) performed: None  Critical Care performed:  None  ____________________________________________   ED COURSE / ASSESSMENT AND PLAN  Pertinent labs & imaging results that were available during my care of the patient were reviewed by me and considered in my medical decision making (see chart for details).   I have reviewed the notes and are epic as well as care everywhere from [facility] where she had the MRI regarding the pancreatic cyst. 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  In any case, today she is not having any particular cardiac or pulmonary symptoms. I don't think she needs any advanced imaging today in the emergency department based on her symptom of feeling lightheaded and weak, and now feeling better.  Laboratories studies indicate elevated BUN and creatinine, decreased GFR consistent with likely dehydration although she does not give a clear history of risk factor for dehydration. I am going to give her a normal saline bolus here.  Symptoms by history and on exam not consistent with neurologic deficit and I'm not recommending head imaging at this point in time.  Exam evaluation are overall reassuring.  Orthostatics after IV fluids are without orthostatic drop.    CONSULTATIONS:   None   Patient / Family / Caregiver informed of clinical course, medical decision-making process, and agree with plan.   I discussed return precautions, follow-up instructions, and discharge instructions with patient and/or family.   ___________________________________________   FINAL CLINICAL IMPRESSION(S) / ED DIAGNOSES   Final diagnoses:  Generalized weakness              Note: This dictation was prepared with Dragon dictation. Any transcriptional errors that result from this process are unintentional    Lisa Roca, MD 08/20/16 [phone]

## 2016-09-23 ENCOUNTER — Other Ambulatory Visit: Payer: Self-pay | Admitting: Internal Medicine

## 2016-09-23 DIAGNOSIS — Z1231 Encounter for screening mammogram for malignant neoplasm of breast: Secondary | ICD-10-CM

## 2016-10-01 ENCOUNTER — Emergency Department: Payer: Medicare Other

## 2016-10-01 ENCOUNTER — Emergency Department
Admission: EM | Admit: 2016-10-01 | Discharge: 2016-10-01 | Disposition: A | Discharge status: Home / Self Care | Payer: Medicare Other | Attending: Student in an Organized Health Care Education/Training Program | Admitting: Student in an Organized Health Care Education/Training Program

## 2016-10-01 ENCOUNTER — Encounter: Payer: Self-pay | Admitting: Emergency Medicine

## 2016-10-01 DIAGNOSIS — S4991XA Unspecified injury of right shoulder and upper arm, initial encounter: Secondary | ICD-10-CM | POA: Diagnosis present

## 2016-10-01 DIAGNOSIS — Y999 Unspecified external cause status: Secondary | ICD-10-CM | POA: Diagnosis not present

## 2016-10-01 DIAGNOSIS — N189 Chronic kidney disease, unspecified: Secondary | ICD-10-CM | POA: Insufficient documentation

## 2016-10-01 DIAGNOSIS — W0110XA Fall on same level from slipping, tripping and stumbling with subsequent striking against unspecified object, initial encounter: Secondary | ICD-10-CM | POA: Insufficient documentation

## 2016-10-01 DIAGNOSIS — Y939 Activity, unspecified: Secondary | ICD-10-CM | POA: Insufficient documentation

## 2016-10-01 DIAGNOSIS — I2510 Atherosclerotic heart disease of native coronary artery without angina pectoris: Secondary | ICD-10-CM | POA: Insufficient documentation

## 2016-10-01 DIAGNOSIS — S43014A Anterior dislocation of right humerus, initial encounter: Secondary | ICD-10-CM | POA: Diagnosis not present

## 2016-10-01 DIAGNOSIS — E1122 Type 2 diabetes mellitus with diabetic chronic kidney disease: Secondary | ICD-10-CM | POA: Insufficient documentation

## 2016-10-01 DIAGNOSIS — S8001XA Contusion of right knee, initial encounter: Secondary | ICD-10-CM | POA: Insufficient documentation

## 2016-10-01 DIAGNOSIS — Y92481 Parking lot as the place of occurrence of the external cause: Secondary | ICD-10-CM | POA: Insufficient documentation

## 2016-10-01 DIAGNOSIS — Z79899 Other long term (current) drug therapy: Secondary | ICD-10-CM | POA: Diagnosis not present

## 2016-10-01 DIAGNOSIS — I129 Hypertensive chronic kidney disease with stage 1 through stage 4 chronic kidney disease, or unspecified chronic kidney disease: Secondary | ICD-10-CM | POA: Insufficient documentation

## 2016-10-01 DIAGNOSIS — Z7984 Long term (current) use of oral hypoglycemic drugs: Secondary | ICD-10-CM | POA: Diagnosis not present

## 2016-10-01 DIAGNOSIS — W19XXXA Unspecified fall, initial encounter: Secondary | ICD-10-CM

## 2016-10-01 LAB — CBC WITH DIFFERENTIAL/PLATELET
Basophils Absolute: 0 10*3/uL (ref 0–0.1)
Basophils Relative: 0 %
Eosinophils Absolute: 0.1 10*3/uL (ref 0–0.7)
Eosinophils Relative: 1 %
HCT: 33.9 % — ABNORMAL LOW (ref 35.0–47.0)
Hemoglobin: 11.6 g/dL — ABNORMAL LOW (ref 12.0–16.0)
Lymphocytes Relative: 16 %
Lymphs Abs: 1.6 10*3/uL (ref 1.0–3.6)
MCH: 31.9 pg (ref 26.0–34.0)
MCHC: 34.3 g/dL (ref 32.0–36.0)
MCV: 93 fL (ref 80.0–100.0)
Monocytes Absolute: 0.5 10*3/uL (ref 0.2–0.9)
Monocytes Relative: 5 %
Neutro Abs: 7.9 10*3/uL — ABNORMAL HIGH (ref 1.4–6.5)
Neutrophils Relative %: 78 %
Platelets: 179 10*3/uL (ref 150–440)
RBC: 3.65 MIL/uL — ABNORMAL LOW (ref 3.80–5.20)
RDW: 13.9 % (ref 11.5–14.5)
WBC: 10.2 10*3/uL (ref 3.6–11.0)

## 2016-10-01 LAB — BASIC METABOLIC PANEL
Anion gap: 4 — ABNORMAL LOW (ref 5–15)
BUN: 27 mg/dL — ABNORMAL HIGH (ref 6–20)
CO2: 26 mmol/L (ref 22–32)
Calcium: 8.4 mg/dL — ABNORMAL LOW (ref 8.9–10.3)
Chloride: 109 mmol/L (ref 101–111)
Creatinine, Ser: 1.29 mg/dL — ABNORMAL HIGH (ref 0.44–1.00)
GFR calc Af Amer: 48 mL/min — ABNORMAL LOW (ref >60)
GFR calc non Af Amer: 41 mL/min — ABNORMAL LOW (ref >60)
Glucose, Bld: 137 mg/dL — ABNORMAL HIGH (ref 65–99)
Potassium: 3.7 mmol/L (ref 3.5–5.1)
Sodium: 139 mmol/L (ref 135–145)

## 2016-10-01 MED ORDER — FENTANYL CITRATE (PF) 100 MCG/2ML IJ SOLN
100.0000 ug | INTRAMUSCULAR | Status: DC | PRN
  Administered 2016-10-01: 100 ug via INTRAVENOUS
  Filled 2016-10-01 (×2): qty 2

## 2016-10-01 MED ORDER — ETOMIDATE 2 MG/ML IV SOLN
INTRAVENOUS | Status: DC | PRN
  Administered 2016-10-01: 12.2 mg via INTRAVENOUS
  Administered 2016-10-01: 10 mg via INTRAVENOUS

## 2016-10-01 MED ORDER — ETOMIDATE 2 MG/ML IV SOLN
0.1500 mg/kg | Freq: Once | INTRAVENOUS | Status: AC
  Administered 2016-10-01: 12.24 mg via INTRAVENOUS
  Filled 2016-10-01 (×2): qty 10

## 2016-10-01 MED ORDER — LIDOCAINE HCL (PF) 1 % IJ SOLN
INTRAMUSCULAR | Status: AC
  Filled 2016-10-01: qty 20

## 2016-10-01 MED ORDER — ETOMIDATE 2 MG/ML IV SOLN
0.3000 mg/kg | Freq: Once | INTRAVENOUS | Status: DC
  Filled 2016-10-01: qty 20

## 2016-10-01 MED ORDER — LABETALOL HCL 5 MG/ML IV SOLN
5.0000 mg | Freq: Once | INTRAVENOUS | Status: AC
  Administered 2016-10-01: 5 mg via INTRAVENOUS
  Filled 2016-10-01: qty 4

## 2016-10-01 MED ORDER — LIDOCAINE HCL (PF) 1 % IJ SOLN
30.0000 mL | Freq: Once | INTRAMUSCULAR | Status: DC
  Filled 2016-10-01 (×2): qty 30

## 2016-10-01 NOTE — ED Notes (Signed)
Patient transported to X-ray 

## 2016-10-01 NOTE — ED Triage Notes (Signed)
Pt states she fell in a parking lot on Wednesday and now having right shoulder pain.

## 2016-10-01 NOTE — Sedation Documentation (Signed)
Pt's eyes opened in response to MD questioning.

## 2016-10-01 NOTE — ED Notes (Signed)
Pt reports no pain. Pt states she only has pain when she moves it.

## 2016-10-01 NOTE — ED Notes (Signed)
Sedation end 1942.

## 2016-10-01 NOTE — Sedation Documentation (Signed)
Pt asleep.

## 2016-10-01 NOTE — ED Notes (Signed)
Pt fell Wed and hit right shoulder - it continues to hurt and pt is unable to use that shoulder/arm - appears to be ? Dislocated - no relief from pain medication

## 2016-10-01 NOTE — ED Provider Notes (Signed)
[facility] Emergency Department Provider Note    First MD Initiated Contact with Patient 10/01/16 1504     (approximate)  I have reviewed the triage vital signs and the nursing notes.   HISTORY  Chief Complaint Shoulder Pain    HPI [name] is a 71 y.o. female presents with 2 days of right shoulder pain after having a fall at [facility] on Wednesday. Patient was therefore evaluation of the pancreatic cyst that she's noted about.  fell in the parking lot tripped and hit her right knee and right shoulder. Since then has been unable to move her right shoulder but did not want to go back to the hospital she was afraid they would "keep her there". She came back home. No numbness or tingling.Did not hit her head. Currently rates the pain as a 0-10 minutes at rest. It is 9 out of 10 when she tries to move it.   Past Medical History:  Diagnosis Date  . Arthritis   . Cerebral aneurysm   . Chronic kidney disease   . Complication of anesthesia    "take a long time to awaken "  . Coronary artery disease   . Diabetes mellitus   . Headache(784.0)   . Heart murmur   . Hypertension   . Sleep apnea    does not use cpap  . Systemic lupus erythematosus ([facility])    Family History  Problem Relation Age of Onset  . Breast cancer Paternal Aunt 42  . Breast cancer Paternal Aunt   . Heart disease Mother   . Heart disease Father   . Stroke Father   . Diabetes Sister   . Diabetes Brother    Past Surgical History:  Procedure Laterality Date  . ANEURYSM COILING  3/15  . BACK SURGERY    . BARTHOLIN CYST MARSUPIALIZATION    . BREAST BIOPSY Right 02/04/2012   neg  . BREAST CYST ASPIRATION Right 01/25/2012   FNA neg.  Marland Kitchen BREAST EXCISIONAL BIOPSY Left 06/26/2007   neg  . CARDIAC CATHETERIZATION  08   stents  . CEREBRAL ANEURYSM REPAIR  2008   A COM  . CEREBRAL ANEURYSM REPAIR  2008   R MCA  . CERVICAL SPINE SURGERY    . EYE SURGERY     cataract  .  UPPER ESOPHAGEAL ENDOSCOPIC ULTRASOUND (EUS) N/A 04/15/2016   Procedure: UPPER ESOPHAGEAL ENDOSCOPIC ULTRASOUND (EUS);  Surgeon: Holly Bodily, MD;  Location: [facility] ENDOSCOPY;  Service: Gastroenterology;  Laterality: N/A;   There are no active problems to display for this patient.     Prior to Admission medications   Medication Sig Start Date End Date Taking? Authorizing Provider  allopurinol (ZYLOPRIM) 100 MG tablet Take 100 mg by mouth daily.    Historical Provider, MD  atenolol (TENORMIN) 100 MG tablet Take 100 mg by mouth daily.    Historical Provider, MD  Cholecalciferol (VITAMIN D3) 10000 UNITS capsule Take 10,000 Units by mouth daily.    Historical Provider, MD  clopidogrel (PLAVIX) 75 MG tablet Take 75 mg by mouth daily.    Historical Provider, MD  furosemide (LASIX) 40 MG tablet Take 40 mg by mouth daily.     Historical Provider, MD  glimepiride (AMARYL) 2 MG tablet Take 3 mg by mouth 2 (two) times daily.     Historical Provider, MD  hydrALAZINE (APRESOLINE) 50 MG tablet Take 25 mg by mouth 2 (two) times daily.    Historical Provider, MD  hydroxychloroquine (PLAQUENIL) 200 MG tablet TAKE 1 TABLET BY MOUTH daily 11/13/14   Historical Provider, MD  labetalol (NORMODYNE) 300 MG tablet Take 300 mg by mouth 2 (two) times daily. 07/24/16   Historical Provider, MD  omeprazole (PRILOSEC) 40 MG capsule Take 40 mg by mouth daily. 07/24/16   Historical Provider, MD  rosuvastatin (CRESTOR) 5 MG tablet Take 5 mg by mouth at bedtime. 07/24/16   Historical Provider, MD  valsartan (DIOVAN) 320 MG tablet Take 320 mg by mouth daily. 01/26/16   Historical Provider, MD  zolpidem (AMBIEN) 5 MG tablet Take 5 mg by mouth at bedtime as needed. For sleep    Historical Provider, MD    Allergies Sulfa antibiotics    Social History Social History  Substance Use Topics  . Smoking status: Never Smoker  . Smokeless tobacco: Never Used  . Alcohol use Yes     Comment: Occasionally     Review of  Systems Patient denies headaches, rhinorrhea, blurry vision, numbness, shortness of breath, chest pain, edema, cough, abdominal pain, nausea, vomiting, diarrhea, dysuria, fevers, rashes or hallucinations unless otherwise stated above in HPI. ____________________________________________   PHYSICAL EXAM:  VITAL SIGNS: Vitals:   10/01/16 1456  BP: 138/79  Pulse: 69  Resp: 15  Temp: 98.7 F (37.1 C)    Constitutional: Alert and oriented. Well appearing and in no acute distress. Eyes: Conjunctivae are normal. PERRL. EOMI. Head: Atraumatic. Nose: No congestion/rhinnorhea. Mouth/Throat: Mucous membranes are moist.  Oropharynx non-erythematous. Neck: No stridor. Painless ROM. No cervical spine tenderness to palpation Hematological/Lymphatic/Immunilogical: No cervical lymphadenopathy. Cardiovascular: Normal rate, regular rhythm. Grossly normal heart sounds.  Good peripheral circulation. Respiratory: Normal respiratory effort.  No retractions. Lungs CTAB. Gastrointestinal: Soft and nontender. No distention. No abdominal bruits. No CVA tenderness. Genitourinary:  Musculoskeletal:  obvious deformity to right shoulder, SILT distally, 2+ radial pulse.  No axillary nerve paresthesia.  Contusion to right knee with no effusion.  Full painless ROM Neurologic:  Normal speech and language. No gross focal neurologic deficits are appreciated. No gait instability. Skin:  Skin is warm, dry and intact. No rash noted. Psychiatric: Mood and affect are normal. Speech and behavior are normal.  ____________________________________________   LABS (all labs ordered are listed, but only abnormal results are displayed)  Results for orders placed or performed during the hospital encounter of 10/01/16 (from the past 24 hour(s))  CBC with Differential/Platelet     Status: Abnormal   Collection Time: 10/01/16  8:02 PM  Result Value Ref Range   WBC 10.2 3.6 - 11.0 K/uL   RBC 3.65 (L) 3.80 - 5.20 MIL/uL    Hemoglobin 11.6 (L) 12.0 - 16.0 g/dL   HCT 33.9 (L) 35.0 - 47.0 %   MCV 93.0 80.0 - 100.0 fL   MCH 31.9 26.0 - 34.0 pg   MCHC 34.3 32.0 - 36.0 g/dL   RDW 13.9 11.5 - 14.5 %   Platelets 179 150 - 440 K/uL   Neutrophils Relative % 78 %   Neutro Abs 7.9 (H) 1.4 - 6.5 K/uL   Lymphocytes Relative 16 %   Lymphs Abs 1.6 1.0 - 3.6 K/uL   Monocytes Relative 5 %   Monocytes Absolute 0.5 0.2 - 0.9 K/uL   Eosinophils Relative 1 %   Eosinophils Absolute 0.1 0 - 0.7 K/uL   Basophils Relative 0 %   Basophils Absolute 0.0 0 - 0.1 K/uL  Basic metabolic panel     Status: Abnormal   Collection Time: 10/01/16  8:02  PM  Result Value Ref Range   Sodium 139 135 - 145 mmol/L   Potassium 3.7 3.5 - 5.1 mmol/L   Chloride 109 101 - 111 mmol/L   CO2 26 22 - 32 mmol/L   Glucose, Bld 137 (H) 65 - 99 mg/dL   BUN 27 (H) 6 - 20 mg/dL   Creatinine, Ser 1.29 (H) 0.44 - 1.00 mg/dL   Calcium 8.4 (L) 8.9 - 10.3 mg/dL   GFR calc non Af Amer 41 (L) >60 mL/min   GFR calc Af Amer 48 (L) >60 mL/min   Anion gap 4 (L) 5 - 15   ____________________________________________ ____________________________________________  RADIOLOGY  I personally reviewed all radiographic images ordered to evaluate for the above acute complaints and reviewed radiology reports and findings.  These findings were personally discussed with the patient.  Please see medical record for radiology report.  ____________________________________________   PROCEDURES  Procedure(s) performed:  ORTHOPEDIC INJURY TREATMENT Date/Time: 10/01/2016 4:58 PM Performed by: Merlyn Lot Authorized by: Merlyn Lot  Consent: Written consent obtained. Consent given by: patient Required items: required blood products, implants, devices, and special equipment available Patient identity confirmed: verbally with patient Time out: Immediately prior to procedure a "time out" was called to verify the correct patient, procedure, equipment, support staff and  site/side marked as required. Injury location: shoulder Location details: right shoulder Injury type: dislocation Dislocation type: inferior Hill-Sachs deformity: yes Chronicity: new Pre-procedure distal perfusion: normal Pre-procedure neurological function: normal Pre-procedure range of motion: normal Anesthesia: local infiltration  Anesthesia: Local anesthesia used: yes Local Anesthetic: lidocaine 1% without epinephrine Anesthetic total: 15 mL  Sedation: Patient sedated: yes Sedation type: moderate (conscious) sedation Sedatives: etomidate Vitals: Vital signs were monitored during sedation. Manipulation performed: yes Reduction method: external rotation, Milch technique, scapular manipulation, Stimson maneuver and traction and counter traction Reduction successful: yes (after multiple attempts) X-ray confirmed reduction: yes Immobilization: sling Post-procedure neurovascular assessment: post-procedure neurovascularly intact Post-procedure distal perfusion: normal Post-procedure neurological function: normal Post-procedure range of motion: normal Patient tolerance: Patient tolerated the procedure well with no immediate complications       Critical Care performed: no ____________________________________________   INITIAL IMPRESSION / ASSESSMENT AND PLAN / ED COURSE  Pertinent labs & imaging results that were available during my care of the patient were reviewed by me and considered in my medical decision making (see chart for details).  DDX: fracture, dislocation, contusion  [name] is a 71 y.o. who presents to the ED with acute right shoulder pain 2 days after fall from standing. No evidence of other associated traumatic injury. She is neurovascular intact. Presentation and exam is consistent with right shoulder dislocation. We will trial reduction under lidocaine and fentanyl.  The patient will be placed on continuous pulse oximetry and telemetry for  monitoring.  Laboratory evaluation will be sent to evaluate for the above complaints.     Clinical Course as of Oct 02 2022  Fri Oct 01, 2016  1659 Should with persistent dislocation,  Will plan procedural sedation.  [PR]  1802 Procedural sedation successfully completed with what clinically appears to be successful reduction. Will order a confirmatory x-ray. I am concerned that the patient's shoulder is remarkably unstable.   Patient placed in sling immediately following reduction. Stat x-ray ordered.  [PR]  2013 DG Shoulder Right Portable [PR]  2020 Subsequent postreduction x-ray shows improvement alignment of the shoulder and placement does still have a persistent inferior subluxation. A spoke with Dr. Sabra Heck who states that he will  see patient in the next 2-3 days for reevaluation and discussion of further operative management. Patient remains neurovascularly intact. No acute distress. Tolerating oral hydration. Currently sober and stable after sedation.  [PR]    Clinical Course User Index [PR] Merlyn Lot, MD     ____________________________________________   FINAL CLINICAL IMPRESSION(S) / ED DIAGNOSES  Final diagnoses:  Fall  Anterior shoulder dislocation, right, initial encounter      NEW MEDICATIONS STARTED DURING THIS VISIT:  New Prescriptions   No medications on file     Note:  This document was prepared using Dragon voice recognition software and may include unintentional dictation errors.    Merlyn Lot, MD 10/01/16 2103

## 2016-10-01 NOTE — ED Notes (Signed)
Pt alert and oriented x 4 and able to follow commands independently.

## 2016-10-01 NOTE — ED Notes (Signed)
Called pharmacy to send more etomidate

## 2016-10-01 NOTE — ED Notes (Addendum)
End procedural sedation - pt is alert and oriented and responds to all questions appropriately - xray shows that pt right shoulder is out of place again per Dr Quentin Cornwall

## 2016-10-01 NOTE — Sedation Documentation (Signed)
MD finished procedure and is at bedside holding placement until verification on placement. XRAY called.

## 2016-10-01 NOTE — Sedation Documentation (Signed)
X RAY at bedside 

## 2016-10-01 NOTE — Sedation Documentation (Signed)
Pt verbally responsive to MD questioning.

## 2016-10-03 SURGERY — CLOSED REDUCTION, SHOULDER
Anesthesia: Choice | Laterality: Right

## 2016-10-19 ENCOUNTER — Ambulatory Visit: Payer: Medicare Other | Attending: Internal Medicine

## 2016-11-08 ENCOUNTER — Other Ambulatory Visit: Payer: Self-pay | Admitting: Internal Medicine

## 2016-11-08 DIAGNOSIS — R1011 Right upper quadrant pain: Secondary | ICD-10-CM

## 2016-11-25 ENCOUNTER — Other Ambulatory Visit: Payer: Self-pay | Admitting: Internal Medicine

## 2016-11-26 ENCOUNTER — Ambulatory Visit
Admission: RE | Admit: 2016-11-26 | Discharge: 2016-11-26 | Disposition: A | Discharge status: Home / Self Care | Payer: Medicare Other | Source: Ambulatory Visit | Attending: Internal Medicine | Admitting: Internal Medicine

## 2016-11-26 DIAGNOSIS — R1011 Right upper quadrant pain: Secondary | ICD-10-CM | POA: Diagnosis present

## 2016-11-26 MED ORDER — TECHNETIUM TC 99M MEBROFENIN IV KIT
5.4600 | PACK | Freq: Once | INTRAVENOUS | Status: AC | PRN
  Administered 2016-11-26: 5.46 via INTRAVENOUS

## 2017-05-25 ENCOUNTER — Other Ambulatory Visit: Payer: Self-pay | Admitting: Specialist

## 2017-05-25 DIAGNOSIS — M25511 Pain in right shoulder: Secondary | ICD-10-CM

## 2017-05-31 ENCOUNTER — Ambulatory Visit: Payer: Medicare Other

## 2017-06-07 ENCOUNTER — Ambulatory Visit
Admission: RE | Admit: 2017-06-07 | Discharge: 2017-06-07 | Disposition: A | Discharge status: Home / Self Care | Payer: Medicare Other | Source: Ambulatory Visit | Attending: Specialist | Admitting: Specialist

## 2017-06-07 DIAGNOSIS — M7591 Shoulder lesion, unspecified, right shoulder: Secondary | ICD-10-CM | POA: Diagnosis not present

## 2017-06-07 DIAGNOSIS — M25611 Stiffness of right shoulder, not elsewhere classified: Secondary | ICD-10-CM | POA: Insufficient documentation

## 2017-06-07 DIAGNOSIS — M25511 Pain in right shoulder: Secondary | ICD-10-CM | POA: Diagnosis present

## 2017-06-07 DIAGNOSIS — M19011 Primary osteoarthritis, right shoulder: Secondary | ICD-10-CM | POA: Insufficient documentation

## 2017-08-25 ENCOUNTER — Other Ambulatory Visit: Payer: Self-pay | Admitting: Internal Medicine

## 2017-08-25 DIAGNOSIS — Z1231 Encounter for screening mammogram for malignant neoplasm of breast: Secondary | ICD-10-CM

## 2017-10-25 ENCOUNTER — Ambulatory Visit: Payer: Medicare Other | Admitting: Urology

## 2017-11-03 ENCOUNTER — Encounter: Payer: Self-pay | Admitting: Urology

## 2017-11-03 ENCOUNTER — Ambulatory Visit (INDEPENDENT_AMBULATORY_CARE_PROVIDER_SITE_OTHER): Payer: Medicare Other | Admitting: Urology

## 2017-11-03 VITALS — BP 136/82 | HR 76 | Ht 63.0 in | Wt 175.0 lb

## 2017-11-03 DIAGNOSIS — N390 Urinary tract infection, site not specified: Secondary | ICD-10-CM

## 2017-11-03 DIAGNOSIS — E119 Type 2 diabetes mellitus without complications: Secondary | ICD-10-CM | POA: Insufficient documentation

## 2017-11-03 DIAGNOSIS — M109 Gout, unspecified: Secondary | ICD-10-CM | POA: Insufficient documentation

## 2017-11-03 DIAGNOSIS — R8271 Bacteriuria: Secondary | ICD-10-CM | POA: Diagnosis not present

## 2017-11-03 DIAGNOSIS — M1990 Unspecified osteoarthritis, unspecified site: Secondary | ICD-10-CM | POA: Insufficient documentation

## 2017-11-03 DIAGNOSIS — I2510 Atherosclerotic heart disease of native coronary artery without angina pectoris: Secondary | ICD-10-CM | POA: Insufficient documentation

## 2017-11-03 LAB — URINALYSIS, COMPLETE
Bilirubin, UA: NEGATIVE
Glucose, UA: NEGATIVE
Nitrite, UA: NEGATIVE
RBC, UA: NEGATIVE
Specific Gravity, UA: >1.03 — ABNORMAL HIGH (ref 1.005–1.030)
Urobilinogen, Ur: 0.2 mg/dL (ref 0.2–1.0)
pH, UA: 5 (ref 5.0–7.5)

## 2017-11-03 LAB — MICROSCOPIC EXAMINATION
Epithelial Cells (non renal): >10 /hpf — ABNORMAL HIGH (ref 0–10)
RBC, UA: NONE SEEN /hpf (ref 0–2)

## 2017-11-03 LAB — BLADDER SCAN AMB NON-IMAGING

## 2017-11-03 NOTE — Progress Notes (Signed)
11/03/2017 3:06 PM   [name] 1946-03-06 [id number]  Referring provider: [facility] [place], [place] [address]  Chief Complaint  Patient presents with  . Recurrent UTI    New Patient    HPI: The patient is a 71 year old female with a past medical history significant for solitary kidney who was referred for evaluation of recurrent UTIs.  I only have one culture available to me from February 2019 which grew E. coli resistant to fluoroquinolones, bactrim, and ampicillin.  I discussed with the patient, the patient notes that she has developed a foul-smelling odor in her urine multiple times.  She was placed on antibiotics due to the smell.  At that time, she also did have the aforementioned positive E. coli culture.  She has been on and off antibiotics due to the smell of her urine for some time.  On further questioning, the patient has never had dysuria, suprapubic tenderness, increased frequency, increased urgency, or fever.  She was only on the antibiotics for the smell of there urine.   PMH: Past Medical History:  Diagnosis Date  . Arthritis   . Cerebral aneurysm   . Chronic kidney disease   . Complication of anesthesia    "take a long time to awaken "  . Coronary artery disease   . Diabetes mellitus   . Headache(784.0)   . Heart murmur   . Hypertension   . Sleep apnea    does not use cpap  . Systemic lupus erythematosus ([place])     Surgical History: Past Surgical History:  Procedure Laterality Date  . ANEURYSM COILING  3/15  . BACK SURGERY    . BARTHOLIN CYST MARSUPIALIZATION    . BREAST BIOPSY Right 02/04/2012   neg  . BREAST CYST ASPIRATION Right 01/25/2012   FNA neg.  Marland Kitchen BREAST EXCISIONAL BIOPSY Left 06/26/2007   neg  . CARDIAC CATHETERIZATION  08   stents  . CEREBRAL ANEURYSM REPAIR  2008   A COM  . CEREBRAL ANEURYSM REPAIR  2008   R MCA  . CERVICAL SPINE SURGERY    . EYE SURGERY     cataract  . UPPER ESOPHAGEAL ENDOSCOPIC  ULTRASOUND (EUS) N/A 04/15/2016   Procedure: UPPER ESOPHAGEAL ENDOSCOPIC ULTRASOUND (EUS);  Surgeon: Holly Bodily, MD;  Location: [facility] ENDOSCOPY;  Service: Gastroenterology;  Laterality: N/A;    Home Medications:  Allergies as of 11/03/2017      Reactions   Sulfa Antibiotics Itching      Medication List        Accurate as of 11/03/17  3:06 PM. Always use your most recent med list.          allopurinol 100 MG tablet Commonly known as:  ZYLOPRIM Take 100 mg by mouth daily.   atenolol 100 MG tablet Commonly known as:  TENORMIN Take 100 mg by mouth daily.   benzonatate 100 MG capsule Commonly known as:  TESSALON TAKE 1 CAPSULE BY MOUTH THREE TIMES A DAY   clopidogrel 75 MG tablet Commonly known as:  PLAVIX Take 75 mg by mouth daily.   furosemide 40 MG tablet Commonly known as:  LASIX Take 40 mg by mouth daily.   glimepiride 2 MG tablet Commonly known as:  AMARYL Take 3 mg by mouth 2 (two) times daily.   hydrALAZINE 50 MG tablet Commonly known as:  APRESOLINE Take 25 mg by mouth 2 (two) times daily.   hydroxychloroquine 200 MG tablet Commonly known as:  PLAQUENIL TAKE 1 TABLET BY MOUTH daily   labetalol 300 MG tablet Commonly known as:  NORMODYNE Take 300 mg by mouth 2 (two) times daily.   omeprazole 40 MG capsule Commonly known as:  PRILOSEC Take 40 mg by mouth daily.   rosuvastatin 5 MG tablet Commonly known as:  CRESTOR Take 5 mg by mouth at bedtime.   valsartan 320 MG tablet Commonly known as:  DIOVAN Take 320 mg by mouth daily.   Vitamin D3 10000 units capsule Take 10,000 Units by mouth daily.   zolpidem 5 MG tablet Commonly known as:  AMBIEN Take 5 mg by mouth at bedtime as needed. For sleep       Allergies:  Allergies  Allergen Reactions  . Sulfa Antibiotics Itching    Family History: Family History  Problem Relation Age of Onset  . Breast cancer Paternal Aunt 56  . Breast cancer Paternal Aunt   . Heart disease Mother   .  Heart disease Father   . Stroke Father   . Diabetes Sister   . Diabetes Brother     Social History:  reports that she has never smoked. She has never used smokeless tobacco. She reports that she drinks alcohol. She reports that she does not use drugs.  ROS: UROLOGY Frequent Urination?: Yes Hard to postpone urination?: Yes Burning/pain with urination?: No Get up at night to urinate?: No Leakage of urine?: No Urine stream starts and stops?: Yes Trouble starting stream?: No Do you have to strain to urinate?: No Blood in urine?: No Urinary tract infection?: Yes Sexually transmitted disease?: No Injury to kidneys or bladder?: Yes Painful intercourse?: No Weak stream?: Yes Currently pregnant?: No Vaginal bleeding?: No Last menstrual period?: n  Gastrointestinal Nausea?: No Vomiting?: No Indigestion/heartburn?: No Diarrhea?: No Constipation?: Yes  Constitutional Fever: Yes Night sweats?: No Weight loss?: No Fatigue?: Yes  Skin Skin rash/lesions?: No Itching?: No  Eyes Blurred vision?: No Double vision?: Yes  Ears/Nose/Throat Sore throat?: Yes Sinus problems?: Yes  Hematologic/Lymphatic Swollen glands?: No Easy bruising?: No  Cardiovascular Leg swelling?: Yes Chest pain?: No  Respiratory Cough?: Yes Shortness of breath?: Yes  Endocrine Excessive thirst?: No  Musculoskeletal Back pain?: Yes Joint pain?: Yes  Neurological Headaches?: Yes Dizziness?: No  Psychologic Depression?: No Anxiety?: Yes  Physical Exam: BP 136/82   Pulse 76   Ht '5\' 3"'$  (1.6 m)   Wt 175 lb (79.4 kg)   BMI 31.00 kg/m   Constitutional:  Alert and oriented, No acute distress. HEENT: [place] AT, moist mucus membranes.  Trachea midline, no masses. Cardiovascular: No clubbing, cyanosis, or edema. Respiratory: Normal respiratory effort, no increased work of breathing. GI: Abdomen is soft, nontender, nondistended, no abdominal masses GU: No CVA tenderness.  Skin: No rashes,  bruises or suspicious lesions. Lymph: No cervical or inguinal adenopathy. Neurologic: Grossly intact, no focal deficits, moving all 4 extremities. Psychiatric: Normal mood and affect.  Laboratory Data: Lab Results  Component Value Date   WBC 10.2 10/01/2016   HGB 11.6 (L) 10/01/2016   HCT 33.9 (L) 10/01/2016   MCV 93.0 10/01/2016   PLT 179 10/01/2016    Lab Results  Component Value Date   CREATININE 1.29 (H) 10/01/2016    No results found for: PSA  No results found for: TESTOSTERONE  Lab Results  Component Value Date   HGBA1C 6.5 (H) 05/31/2013    Urinalysis    Component Value Date/Time   COLORURINE STRAW (A) 08/20/2016 0720   APPEARANCEUR CLEAR (A) 08/20/2016  0720   LABSPEC 1.011 08/20/2016 0720   PHURINE 6.0 08/20/2016 0720   GLUCOSEU NEGATIVE 08/20/2016 0720   HGBUR NEGATIVE 08/20/2016 0720   BILIRUBINUR NEGATIVE 08/20/2016 0720   KETONESUR NEGATIVE 08/20/2016 0720   PROTEINUR NEGATIVE 08/20/2016 0720   NITRITE NEGATIVE 08/20/2016 0720   LEUKOCYTESUR NEGATIVE 08/20/2016 0720      Assessment & Plan:    1.  Asymptomatic bacteriuria I had an extended conversation with the patient regarding the difference between a positive culture and a urinary tract infection.  At this point, she has never met criteria for a urinary tract infection based on her history.  We did discuss that odor is not a symptom of a UTI.  We also discussed that a positive urine culture should only be treated with concurrent symptoms of a UTI (dysuria, suprapubic pain, increased frequency, increased urgency, and unexplained fevers).  We reviewed that she should never be treated with antibiotics solely based on older even if the urine culture is positive.  I reinforced that this is not considered a UTI and only leads to the bacteria inevitably returning with drug resistance.  She is agreeable to this plan.  She will follow-up with [place] as needed.  Return if symptoms worsen or fail to  improve.  Nickie Retort, MD  [facility] [address] [place], [place] [address] [phone]

## 2019-01-25 ENCOUNTER — Other Ambulatory Visit: Payer: Self-pay | Admitting: Internal Medicine

## 2019-01-25 DIAGNOSIS — Z1231 Encounter for screening mammogram for malignant neoplasm of breast: Secondary | ICD-10-CM

## 2019-03-15 ENCOUNTER — Ambulatory Visit
Admission: RE | Admit: 2019-03-15 | Discharge: 2019-03-15 | Disposition: A | Discharge status: Home / Self Care | Payer: Medicare Other | Source: Ambulatory Visit | Attending: Internal Medicine | Admitting: Internal Medicine

## 2019-03-15 DIAGNOSIS — Z1231 Encounter for screening mammogram for malignant neoplasm of breast: Secondary | ICD-10-CM | POA: Insufficient documentation

## 2019-03-16 ENCOUNTER — Other Ambulatory Visit: Payer: Self-pay | Admitting: Internal Medicine

## 2019-03-16 DIAGNOSIS — N6310 Unspecified lump in the right breast, unspecified quadrant: Secondary | ICD-10-CM

## 2019-03-16 DIAGNOSIS — R928 Other abnormal and inconclusive findings on diagnostic imaging of breast: Secondary | ICD-10-CM

## 2019-03-22 ENCOUNTER — Ambulatory Visit
Admission: RE | Admit: 2019-03-22 | Discharge: 2019-03-22 | Disposition: A | Discharge status: Home / Self Care | Payer: Medicare Other | Source: Ambulatory Visit | Attending: Internal Medicine | Admitting: Internal Medicine

## 2019-03-22 DIAGNOSIS — N6310 Unspecified lump in the right breast, unspecified quadrant: Secondary | ICD-10-CM

## 2019-03-22 DIAGNOSIS — R928 Other abnormal and inconclusive findings on diagnostic imaging of breast: Secondary | ICD-10-CM | POA: Insufficient documentation

## 2019-08-14 ENCOUNTER — Ambulatory Visit: Payer: Self-pay | Admitting: Surgery

## 2019-08-16 ENCOUNTER — Ambulatory Visit (INDEPENDENT_AMBULATORY_CARE_PROVIDER_SITE_OTHER): Payer: Medicare Other | Admitting: Surgery

## 2019-08-16 ENCOUNTER — Encounter: Payer: Self-pay | Admitting: Surgery

## 2019-08-16 ENCOUNTER — Other Ambulatory Visit: Payer: Self-pay

## 2019-08-16 VITALS — BP 143/83 | HR 81 | Temp 97.5°F | Resp 12 | Ht 63.0 in | Wt 176.8 lb

## 2019-08-16 DIAGNOSIS — K649 Unspecified hemorrhoids: Secondary | ICD-10-CM

## 2019-08-16 NOTE — Patient Instructions (Addendum)
Dr Christian Mate recommends you increase your fiber intake, increase fluids and do not hold your bowel movements.  We have placed a referral for you to see [facility]. The will give you a call to schedule an appointment. If you do not hear from their office please contact our office.   You do not have a follow up appointment to come back to see [place] unless you need to be seen again.   Please call the office if you have any questions or concerns.  Hemorrhoids Hemorrhoids are swollen veins in and around the rectum or anus. There are two types of hemorrhoids:  Internal hemorrhoids. These occur in the veins that are just inside the rectum. They may poke through to the outside and become irritated and painful.  External hemorrhoids. These occur in the veins that are outside the anus and can be felt as a painful swelling or hard lump near the anus. Most hemorrhoids do not cause serious problems, and they can be managed with home treatments such as diet and lifestyle changes. If home treatments do not help the symptoms, procedures can be done to shrink or remove the hemorrhoids. What are the causes? This condition is caused by increased pressure in the anal area. This pressure may result from various things, including:  Constipation.  Straining to have a bowel movement.  Diarrhea.  Pregnancy.  Obesity.  Sitting for long periods of time.  Heavy lifting or other activity that causes you to strain.  Anal sex.  Riding a bike for a long period of time. What are the signs or symptoms? Symptoms of this condition include:  Pain.  Anal itching or irritation.  Rectal bleeding.  Leakage of stool (feces).  Anal swelling.  One or more lumps around the anus. How is this diagnosed? This condition can often be diagnosed through a visual exam. Other exams or tests may also be done, such as:  An exam that involves feeling the rectal area with a gloved hand (digital rectal  exam).  An exam of the anal canal that is done using a small tube (anoscope).  A blood test, if you have lost a significant amount of blood.  A test to look inside the colon using a flexible tube with a camera on the end (sigmoidoscopy or colonoscopy). How is this treated? This condition can usually be treated at home. However, various procedures may be done if dietary changes, lifestyle changes, and other home treatments do not help your symptoms. These procedures can help make the hemorrhoids smaller or remove them completely. Some of these procedures involve surgery, and others do not. Common procedures include:  Rubber band ligation. Rubber bands are placed at the base of the hemorrhoids to cut off their blood supply.  Sclerotherapy. Medicine is injected into the hemorrhoids to shrink them.  Infrared coagulation. A type of light energy is used to get rid of the hemorrhoids.  Hemorrhoidectomy surgery. The hemorrhoids are surgically removed, and the veins that supply them are tied off.  Stapled hemorrhoidopexy surgery. The surgeon staples the base of the hemorrhoid to the rectal wall. Follow these instructions at home: Eating and drinking   Eat foods that have a lot of fiber in them, such as whole grains, beans, nuts, fruits, and vegetables.  Ask your health care provider about taking products that have added fiber (fiber supplements).  Reduce the amount of fat in your diet. You can do this by eating low-fat dairy products, eating less red meat, and avoiding processed  foods.  Drink enough fluid to keep your urine pale yellow. Managing pain and swelling   Take warm sitz baths for 20 minutes, 3-4 times a day to ease pain and discomfort. You may do this in a bathtub or using a portable sitz bath that fits over the toilet.  If directed, apply ice to the affected area. Using ice packs between sitz baths may be helpful. ? Put ice in a plastic bag. ? Place a towel between your skin  and the bag. ? Leave the ice on for 20 minutes, 2-3 times a day. General instructions  Take over-the-counter and prescription medicines only as told by your health care provider.  Use medicated creams or suppositories as told.  Get regular exercise. Ask your health care provider how much and what kind of exercise is best for you. In general, you should do moderate exercise for at least 30 minutes on most days of the week (150 minutes each week). This can include activities such as walking, biking, or yoga.  Go to the bathroom when you have the urge to have a bowel movement. Do not wait.  Avoid straining to have bowel movements.  Keep the anal area dry and clean. Use wet toilet paper or moist towelettes after a bowel movement.  Do not sit on the toilet for long periods of time. This increases blood pooling and pain.  Keep all follow-up visits as told by your health care provider. This is important. Contact a health care provider if you have:  Increasing pain and swelling that are not controlled by treatment or medicine.  Difficulty having a bowel movement, or you are unable to have a bowel movement.  Pain or inflammation outside the area of the hemorrhoids. Get help right away if you have:  Uncontrolled bleeding from your rectum. Summary  Hemorrhoids are swollen veins in and around the rectum or anus.  Most hemorrhoids can be managed with home treatments such as diet and lifestyle changes.  Taking warm sitz baths can help ease pain and discomfort.  In severe cases, procedures or surgery can be done to shrink or remove the hemorrhoids. This information is not intended to replace advice given to you by your health care provider. Make sure you discuss any questions you have with your health care provider. Document Revised: 11/03/2018 Document Reviewed: 10/27/2017 Elsevier Patient Education  [place].

## 2019-08-16 NOTE — Progress Notes (Signed)
Patient ID: [name], female   DOB: Oct 29, 1945, 74 y.o.   MRN: [id number]  Chief Complaint: Hemorrhoids.  History of Present Illness [name] is a 74 y.o. female with hemorrhoids, felt to be present for 4 to 5 years.  She reports a sensation of a lump on the outside.  Apparently her partner is concerned about other issues i.e. fistula or abscess.  She she denies any history of constipation reporting daily bowel activity.  She denies blood in stools, she denies pain burning itching.  And she denies pain with defecation.  Her last colonoscopy was multiple years ago, and she appears to be avoiding this.  Past Medical History Past Medical History:  Diagnosis Date  . Arthritis   . Cerebral aneurysm   . Chronic kidney disease   . Complication of anesthesia    "take a long time to awaken "  . Coronary artery disease   . Diabetes mellitus   . Headache(784.0)   . Heart murmur   . Hypertension   . Sleep apnea    does not use cpap  . Systemic lupus erythematosus ([place])       Past Surgical History:  Procedure Laterality Date  . ANEURYSM COILING  3/15  . BACK SURGERY    . BARTHOLIN CYST MARSUPIALIZATION    . BREAST CYST ASPIRATION Right 01/25/2012   FNA neg.  Marland Kitchen BREAST EXCISIONAL BIOPSY Left 06/26/2007   neg  . CARDIAC CATHETERIZATION  08   stents  . CEREBRAL ANEURYSM REPAIR  2008   A COM  . CEREBRAL ANEURYSM REPAIR  2008   R MCA  . CERVICAL SPINE SURGERY    . EYE SURGERY     cataract  . UPPER ESOPHAGEAL ENDOSCOPIC ULTRASOUND (EUS) N/A 04/15/2016   Procedure: UPPER ESOPHAGEAL ENDOSCOPIC ULTRASOUND (EUS);  Surgeon: Holly Bodily, MD;  Location: [facility] ENDOSCOPY;  Service: Gastroenterology;  Laterality: N/A;    Allergies  Allergen Reactions  . Amlodipine Swelling  . Sulfa Antibiotics Itching    Current Outpatient Medications  Medication Sig Dispense Refill  . allopurinol (ZYLOPRIM) 100 MG tablet Take 100 mg by mouth daily.    Marland Kitchen atenolol (TENORMIN) 100 MG  tablet Take 100 mg by mouth daily.    . benzonatate (TESSALON) 100 MG capsule TAKE 1 CAPSULE BY MOUTH THREE TIMES A DAY  0  . Cholecalciferol (VITAMIN D3) 10000 UNITS capsule Take 10,000 Units by mouth daily.    . clopidogrel (PLAVIX) 75 MG tablet Take 75 mg by mouth daily.    . furosemide (LASIX) 40 MG tablet Take 40 mg by mouth daily.     Marland Kitchen gabapentin (NEURONTIN) 100 MG capsule TAKE 1 CAPSULE BY MOUTH EVERYDAY AT BEDTIME    . glimepiride (AMARYL) 2 MG tablet Take 3 mg by mouth 2 (two) times daily.     . hydrALAZINE (APRESOLINE) 50 MG tablet Take 25 mg by mouth 2 (two) times daily.    . hydroxychloroquine (PLAQUENIL) 200 MG tablet TAKE 1 TABLET BY MOUTH daily    . ibandronate (BONIVA) 150 MG tablet Take by mouth.    . isosorbide mononitrate (IMDUR) 30 MG 24 hr tablet Take by mouth.    . labetalol (NORMODYNE) 300 MG tablet Take 300 mg by mouth 2 (two) times daily.  2  . levofloxacin (LEVAQUIN) 250 MG tablet TAKE 1 TABLET BY MOUTH EVERY DAY FOR 10 DAYS    . mometasone (NASONEX) 50 MCG/ACT nasal spray SPRAY 2 SPRAYS EACH NOSTRIL DAILY    .  ONETOUCH VERIO test strip     . pantoprazole (PROTONIX) 40 MG tablet Take 40 mg by mouth daily.    . predniSONE (STERAPRED UNI-PAK 48 TAB) 5 MG (48) TBPK tablet     . rosuvastatin (CRESTOR) 5 MG tablet Take 5 mg by mouth at bedtime.  3  . valACYclovir (VALTREX) 1000 MG tablet     . valsartan (DIOVAN) 320 MG tablet Take 320 mg by mouth daily.  3  . zolpidem (AMBIEN) 5 MG tablet Take 5 mg by mouth at bedtime as needed. For sleep     No current facility-administered medications for this visit.    Family History Family History  Problem Relation Age of Onset  . Breast cancer Paternal Aunt 28  . Breast cancer Paternal Aunt   . Heart disease Mother   . Heart disease Father   . Stroke Father   . Diabetes Sister   . Diabetes Brother       Social History Social History   Tobacco Use  . Smoking status: Never Smoker  . Smokeless tobacco: Never Used   Substance Use Topics  . Alcohol use: Yes    Comment: Occasionally   . Drug use: No        Review of Systems  Constitutional: Negative.   HENT: Negative.   Eyes: Negative.   Respiratory: Negative.   Cardiovascular: Negative.   Gastrointestinal: Negative.   Genitourinary: Negative.   Musculoskeletal: Negative.   Skin: Negative.   Neurological: Negative.   Endo/Heme/Allergies: Negative.       Physical Exam Blood pressure (!) 143/83, pulse 81, temperature (!) 97.5 F (36.4 C), resp. rate 12, height 5\' 3"  (1.6 m), weight 176 lb 12.8 oz (80.2 kg), SpO2 98 %. Last Weight  Most recent update: 08/16/2019  1:41 PM   Weight  80.2 kg (176 lb 12.8 oz)            CONSTITUTIONAL: Well developed, and nourished, appropriately responsive and aware without distress.   EYES: Sclera non-icteric.   EARS, NOSE, MOUTH AND THROAT: Mask worn.   Hearing is intact to voice.  NECK: Trachea is midline, and there is no jugular venous distension.  LYMPH NODES:  Lymph nodes in the neck are not enlarged. RESPIRATORY:  Lungs are clear, and breath sounds are equal bilaterally. Normal respiratory effort without pathologic use of accessory muscles. CARDIOVASCULAR: Heart is regular in rate and rhythm. GI: The abdomen is soft, nontender, and nondistended. There were no palpable masses. I did not appreciate hepatosplenomegaly.  GU: There is no external anal skin abnormality, nor external anal skin tags or hemorrhoids.  There is a mildly prominent but soft hemorrhoidal column on the patient's left.  There is no significant redundancy of internal hemorrhoidal tissue or any other prominent vessels, nodularity or distal anal masses.  There is no gross blood.  There is no tenderness, induration or any thing to warrant suspicion of a perianal issue. MUSCULOSKELETAL:  Symmetrical muscle tone appreciated in all four extremities.    SKIN: Skin turgor is normal. No pathologic skin lesions appreciated.  NEUROLOGIC:  Motor  and sensation appear grossly normal.  Cranial nerves are grossly without defect. PSYCH:  Alert and oriented to person, place and time. Affect is appropriate for situation.  Data Reviewed I have personally reviewed what is currently available of the patient's imaging, recent labs and medical records.   Labs:  CBC Latest Ref Rng & Units 10/01/2016 08/20/2016 04/23/2015  WBC 3.6 - 11.0 K/uL 10.2 5.8 6.6  Hemoglobin 12.0 - 16.0 g/dL 11.6(L) 11.4(L) 12.7  Hematocrit 35.0 - 47.0 % 33.9(L) 35.5 37.8  Platelets 150 - 440 K/uL 179 196 187   CMP Latest Ref Rng & Units 10/01/2016 08/20/2016 04/23/2015  Glucose 65 - 99 mg/dL 137(H) 95 161(H)  BUN 6 - 20 mg/dL 27(H) 31(H) 29(H)  Creatinine 0.44 - 1.00 mg/dL 1.29(H) 1.93(H) 1.39(H)  Sodium 135 - 145 mmol/L 139 144 142  Potassium 3.5 - 5.1 mmol/L 3.7 4.2 3.5  Chloride 101 - 111 mmol/L 109 110 108  CO2 22 - 32 mmol/L 26 27 28   Calcium 8.9 - 10.3 mg/dL 8.4(L) 9.3 9.4  Total Protein 6.4 - 8.2 g/dL - - -  Total Bilirubin 0.2 - 1.0 mg/dL - - -  Alkaline Phos Unit/L - - -  AST 15 - 37 Unit/L - - -  ALT U/L - - -      Imaging:  Within last 24 hrs: No results found.  Assessment    Grade 1 to grade 2 singular/essentially asymptomatic hemorrhoid, internal. Need for screening colonoscopy. Patient Active Problem List   Diagnosis Date Noted  . Coronary disease 11/03/2017  . Diabetes ([place]) 11/03/2017  . Gout 11/03/2017  . Osteoarthritis 11/03/2017    Plan    Will refer to GI for screening colonoscopy. Advised addition/supplementation of fiber in order to avoid adding risk to further development of hemorrhoidal tissues.  Advised that surgery for this degree of hemorrhoids will cause more issues than it will help.  I believe she understands that, I spoke with her female partner over the phone per her request. Face-to-face time spent with the patient and accompanying care providers(if present) was 30 minutes, with more than 50% of the time spent counseling,  educating, and coordinating care of the patient.      Ronny Bacon M.D., FACS 08/16/2019, 2:10 PM

## 2019-08-21 ENCOUNTER — Telehealth: Payer: Self-pay | Admitting: Obstetrics & Gynecology

## 2019-08-21 NOTE — Telephone Encounter (Signed)
[facility] referring for pelvic exam. Paper records. Called and left voicemail for patient to call back to be schedule

## 2019-08-22 ENCOUNTER — Other Ambulatory Visit: Payer: Self-pay | Admitting: Pulmonary Disease

## 2019-08-22 DIAGNOSIS — R911 Solitary pulmonary nodule: Secondary | ICD-10-CM

## 2019-08-28 ENCOUNTER — Ambulatory Visit
Admission: RE | Admit: 2019-08-28 | Discharge: 2019-08-28 | Disposition: A | Discharge status: Home / Self Care | Payer: Medicare Other | Source: Ambulatory Visit | Attending: Pulmonary Disease | Admitting: Pulmonary Disease

## 2019-08-28 ENCOUNTER — Other Ambulatory Visit: Payer: Self-pay

## 2019-08-28 DIAGNOSIS — K8020 Calculus of gallbladder without cholecystitis without obstruction: Secondary | ICD-10-CM | POA: Insufficient documentation

## 2019-08-28 DIAGNOSIS — I2510 Atherosclerotic heart disease of native coronary artery without angina pectoris: Secondary | ICD-10-CM | POA: Diagnosis not present

## 2019-08-28 DIAGNOSIS — R7303 Prediabetes: Secondary | ICD-10-CM | POA: Insufficient documentation

## 2019-08-28 DIAGNOSIS — Q600 Renal agenesis, unilateral: Secondary | ICD-10-CM | POA: Insufficient documentation

## 2019-08-28 DIAGNOSIS — I700 Atherosclerosis of aorta: Secondary | ICD-10-CM | POA: Insufficient documentation

## 2019-08-28 DIAGNOSIS — R911 Solitary pulmonary nodule: Secondary | ICD-10-CM | POA: Diagnosis not present

## 2019-08-28 LAB — GLUCOSE, CAPILLARY: Glucose-Capillary: 122 mg/dL — ABNORMAL HIGH (ref 70–99)

## 2019-08-28 MED ORDER — FLUDEOXYGLUCOSE F - 18 (FDG) INJECTION
9.1000 | Freq: Once | INTRAVENOUS | Status: AC | PRN
  Administered 2019-08-28: 9.507 via INTRAVENOUS

## 2019-09-10 ENCOUNTER — Ambulatory Visit (INDEPENDENT_AMBULATORY_CARE_PROVIDER_SITE_OTHER): Payer: Medicare Other | Admitting: Obstetrics and Gynecology

## 2019-09-10 ENCOUNTER — Other Ambulatory Visit: Payer: Self-pay

## 2019-09-10 ENCOUNTER — Encounter: Payer: Self-pay | Admitting: Obstetrics and Gynecology

## 2019-09-10 VITALS — BP 126/74 | Ht 63.0 in | Wt 174.0 lb

## 2019-09-10 DIAGNOSIS — N9089 Other specified noninflammatory disorders of vulva and perineum: Secondary | ICD-10-CM | POA: Diagnosis not present

## 2019-09-10 DIAGNOSIS — R3989 Other symptoms and signs involving the genitourinary system: Secondary | ICD-10-CM

## 2019-09-10 NOTE — Progress Notes (Signed)
Obstetrics & Gynecology Office Visit   Chief Complaint  Patient presents with  . Referral    pelvic exam   The patient is seen in referral at the request of Perrin Maltese, MD from [facility] for vulvar lump.   History of Present Illness: 74 y.o. [phone] female who is seen in referral from Perrin Maltese, MD from [facility] for vulvar lump.   She notes a lump on the right side of her vulvar. She first noted it at the beginning of the year.  She denies pain.  She believes it is about 1 inch in size. She says the left side is hollow.  She has no symptoms apart from occasional odor.  She notes no changes in the skin overlying.  She states that the lump is on the inside instead of the outside.  She states the lump hasn't changed since she first noticed it.  She has tried nothing to treat the lump.  She has had no other evaluations for the lump.  She states that she had a Bartholin's gland cyst in the past.  She has had a marsupialization for this.    Past Medical History:  Diagnosis Date  . Arthritis   . Cerebral aneurysm   . Chronic kidney disease   . Complication of anesthesia    "take a long time to awaken "  . Coronary artery disease   . Diabetes mellitus   . Headache(784.0)   . Heart murmur   . Hypertension   . Sleep apnea    does not use cpap  . Systemic lupus erythematosus ([place])     Past Surgical History:  Procedure Laterality Date  . ANEURYSM COILING  3/15  . BACK SURGERY    . BARTHOLIN CYST MARSUPIALIZATION    . BREAST CYST ASPIRATION Right 01/25/2012   FNA neg.  Marland Kitchen BREAST EXCISIONAL BIOPSY Left 06/26/2007   neg  . CARDIAC CATHETERIZATION  08   stents  . CEREBRAL ANEURYSM REPAIR  2008   A COM  . CEREBRAL ANEURYSM REPAIR  2008   R MCA  . CERVICAL SPINE SURGERY    . EYE SURGERY     cataract  . UPPER ESOPHAGEAL ENDOSCOPIC ULTRASOUND (EUS) N/A 04/15/2016   Procedure: UPPER ESOPHAGEAL ENDOSCOPIC ULTRASOUND (EUS);  Surgeon: Holly Bodily, MD;  Location: [facility] ENDOSCOPY;  Service: Gastroenterology;  Laterality: N/A;    Gynecologic History: No LMP recorded. Patient is postmenopausal.  Obstetric History: [id number] (h/o 4 vaginal deliveries, 1 stillborn, 4 SABs/EABs, etc).    Family History  Problem Relation Age of Onset  . Breast cancer Paternal Aunt 30  . Breast cancer Paternal Aunt   . Heart disease Mother   . Heart disease Father   . Stroke Father   . Diabetes Sister   . Diabetes Brother     Social History   Socioeconomic History  . Marital status: Widowed    Spouse name: Not on file  . Number of children: Not on file  . Years of education: Not on file  . Highest education level: Not on file  Occupational History  . Not on file  Tobacco Use  . Smoking status: Never Smoker  . Smokeless tobacco: Never Used  Substance and Sexual Activity  . Alcohol use: Yes    Comment: Occasionally   . Drug use: No  . Sexual activity: Not Currently    Birth control/protection: Post-menopausal  Other Topics Concern  . Not on file  Social  History Narrative  . Not on file   Social Determinants of Health   Financial Resource Strain:   . Difficulty of Paying Living Expenses:   Food Insecurity:   . Worried About Charity fundraiser in the Last Year:   . Arboriculturist in the Last Year:   Transportation Needs:   . Film/video editor (Medical):   Marland Kitchen Lack of Transportation (Non-Medical):   Physical Activity:   . Days of Exercise per Week:   . Minutes of Exercise per Session:   Stress:   . Feeling of Stress :   Social Connections:   . Frequency of Communication with Friends and Family:   . Frequency of Social Gatherings with Friends and Family:   . Attends Religious Services:   . Active Member of Clubs or Organizations:   . Attends Archivist Meetings:   Marland Kitchen Marital Status:   Intimate Partner Violence:   . Fear of Current or Ex-Partner:   . Emotionally Abused:   Marland Kitchen Physically Abused:   .  Sexually Abused:     Allergies  Allergen Reactions  . Amlodipine Swelling  . Sulfa Antibiotics Itching    Prior to Admission medications   Medication Sig Start Date End Date Taking? Authorizing Provider  allopurinol (ZYLOPRIM) 100 MG tablet Take 100 mg by mouth daily.    [provider]  atenolol (TENORMIN) 100 MG tablet Take 100 mg by mouth daily.    [provider]  benzonatate (TESSALON) 100 MG capsule TAKE 1 CAPSULE BY MOUTH THREE TIMES A DAY 10/19/17   [provider]  Cholecalciferol (VITAMIN D3) 10000 UNITS capsule Take 10,000 Units by mouth daily.    [provider]  clopidogrel (PLAVIX) 75 MG tablet Take 75 mg by mouth daily.    [provider]  furosemide (LASIX) 40 MG tablet Take 40 mg by mouth daily.     [provider]  gabapentin (NEURONTIN) 100 MG capsule TAKE 1 CAPSULE BY MOUTH EVERYDAY AT BEDTIME 03/06/19   [provider]  glimepiride (AMARYL) 2 MG tablet Take 3 mg by mouth 2 (two) times daily.     [provider]  hydrALAZINE (APRESOLINE) 50 MG tablet Take 25 mg by mouth 2 (two) times daily.    [provider]  hydroxychloroquine (PLAQUENIL) 200 MG tablet TAKE 1 TABLET BY MOUTH daily 11/13/14   [provider]  ibandronate (BONIVA) 150 MG tablet Take by mouth.    [provider]  isosorbide mononitrate (IMDUR) 30 MG 24 hr tablet Take by mouth. 07/05/17   [provider]  labetalol (NORMODYNE) 300 MG tablet Take 300 mg by mouth 2 (two) times daily. 07/24/16   [provider]  levofloxacin (LEVAQUIN) 250 MG tablet TAKE 1 TABLET BY MOUTH EVERY DAY FOR 10 DAYS 03/15/19   [provider]  mometasone (NASONEX) 50 MCG/ACT nasal spray SPRAY 2 SPRAYS EACH NOSTRIL DAILY 02/19/19   [provider]  ONETOUCH VERIO test strip  02/19/19   [provider]  pantoprazole (PROTONIX) 40 MG tablet Take 40 mg by mouth daily. 02/23/19   [provider]    predniSONE (STERAPRED UNI-PAK 48 TAB) 5 MG (48) TBPK tablet  02/19/19   [provider]  rosuvastatin (CRESTOR) 5 MG tablet Take 5 mg by mouth at bedtime. 07/24/16   [provider]  valACYclovir (VALTREX) 1000 MG tablet  02/19/19   [provider]  valsartan (DIOVAN) 320 MG tablet Take 320 mg by  mouth daily. 01/26/16   [provider]  zolpidem (AMBIEN) 5 MG tablet Take 5 mg by mouth at bedtime as needed. For sleep    [provider]    Review of Systems  Constitutional: Negative.   HENT: Negative.   Eyes: Negative.   Respiratory: Negative.   Cardiovascular: Negative.   Gastrointestinal: Negative.   Genitourinary: Negative.   Musculoskeletal: Negative.   Skin: Negative.   Neurological: Negative.   Psychiatric/Behavioral: Negative.      Physical Exam BP 126/74   Ht 5\' 3"  (1.6 m)   Wt 174 lb (78.9 kg)   BMI 30.82 kg/m  No LMP recorded. Patient is postmenopausal. Physical Exam Constitutional:      General: She is not in acute distress.    Appearance: Normal appearance. She is well-developed.  Genitourinary:     Pelvic exam was performed with patient in the lithotomy position.     Vulva, inguinal canal, urethra, bladder, vagina, uterus, right adnexa and left adnexa normal.     No posterior fourchette tenderness, injury or lesion present.     No cervical friability, lesion, bleeding or polyp.     Genitourinary Comments: I can appreciate no vulvar, vaginal, or cervical lesions.  There is a small noted difference in the pelvic floor musculature, which might explain her findings. However, no discernible abnormalities noted.   HENT:     Head: Normocephalic and atraumatic.  Eyes:     General: No scleral icterus.    Conjunctiva/sclera: Conjunctivae normal.  Cardiovascular:     Rate and Rhythm: Normal rate and regular rhythm.     Heart sounds: Murmur present. No friction rub. No gallop.   Pulmonary:     Effort: Pulmonary effort is normal. No  respiratory distress.     Breath sounds: Normal breath sounds. No wheezing or rales.  Abdominal:     General: Bowel sounds are normal. There is no distension.     Palpations: Abdomen is soft. There is no mass.     Tenderness: There is no abdominal tenderness. There is no guarding or rebound.  Musculoskeletal:        General: Normal range of motion.     Cervical back: Normal range of motion and neck supple.  Neurological:     General: No focal deficit present.     Mental Status: She is alert and oriented to person, place, and time.     Cranial Nerves: No cranial nerve deficit.  Skin:    General: Skin is warm and dry.     Findings: No erythema.  Psychiatric:        Mood and Affect: Mood normal.        Behavior: Behavior normal.        Judgment: Judgment normal.   Female chaperone present for pelvic and breast  portions of the physical exam  Wet Prep: PH: 7 Clue Cells: Negative, parabasilar cells noted Fungal elements: Negative Trichomonas: Negative   Assessment: 74 y.o. No obstetric history on file. female here for  1. Vulvar lesion   2. Abnormal urogenital discharge      Plan: Problem List Items Addressed This Visit    None    Visit Diagnoses    Vulvar lesion    -  Primary   Abnormal urogenital discharge         Exam was unremarkable, given her age.  She was encouraged to monitor for future changes and have a pelvic exam with some regularity (every other year), depending  on symptoms.   Wet prep negative  Overall, she was reassured by my findings.    A total of 30 minutes were spent face-to-face with the patient as well as preparation, review, communication, and documentation during this encounter.    Prentice Docker, MD 09/10/2019 1:15 PM    CC: Perrin Maltese, [place],  [place] [address]

## 2019-09-11 ENCOUNTER — Encounter: Payer: Self-pay | Admitting: Oncology

## 2019-09-11 ENCOUNTER — Inpatient Hospital Stay: Payer: Medicare Other | Attending: Oncology | Admitting: Oncology

## 2019-09-11 ENCOUNTER — Other Ambulatory Visit: Payer: Self-pay

## 2019-09-11 ENCOUNTER — Inpatient Hospital Stay: Payer: Medicare Other

## 2019-09-11 ENCOUNTER — Other Ambulatory Visit: Payer: Self-pay | Admitting: *Deleted

## 2019-09-11 VITALS — BP 141/76 | HR 73 | Temp 95.4°F | Wt 175.3 lb

## 2019-09-11 DIAGNOSIS — K862 Cyst of pancreas: Secondary | ICD-10-CM

## 2019-09-11 DIAGNOSIS — R948 Abnormal results of function studies of other organs and systems: Secondary | ICD-10-CM

## 2019-09-11 DIAGNOSIS — R911 Solitary pulmonary nodule: Secondary | ICD-10-CM | POA: Diagnosis not present

## 2019-09-11 DIAGNOSIS — Z79899 Other long term (current) drug therapy: Secondary | ICD-10-CM | POA: Diagnosis not present

## 2019-09-11 DIAGNOSIS — Z803 Family history of malignant neoplasm of breast: Secondary | ICD-10-CM | POA: Insufficient documentation

## 2019-09-11 LAB — CBC WITH DIFFERENTIAL/PLATELET
Abs Immature Granulocytes: 0.01 10*3/uL (ref 0.00–0.07)
Basophils Absolute: 0 10*3/uL (ref 0.0–0.1)
Basophils Relative: 1 %
Eosinophils Absolute: 0.2 10*3/uL (ref 0.0–0.5)
Eosinophils Relative: 5 %
HCT: 34.2 % — ABNORMAL LOW (ref 36.0–46.0)
Hemoglobin: 11.1 g/dL — ABNORMAL LOW (ref 12.0–15.0)
Immature Granulocytes: 0 %
Lymphocytes Relative: 32 %
Lymphs Abs: 1.7 10*3/uL (ref 0.7–4.0)
MCH: 30.7 pg (ref 26.0–34.0)
MCHC: 32.5 g/dL (ref 30.0–36.0)
MCV: 94.7 fL (ref 80.0–100.0)
Monocytes Absolute: 0.2 10*3/uL (ref 0.1–1.0)
Monocytes Relative: 4 %
Neutro Abs: 3 10*3/uL (ref 1.7–7.7)
Neutrophils Relative %: 58 %
Platelets: 149 10*3/uL — ABNORMAL LOW (ref 150–400)
RBC: 3.61 MIL/uL — ABNORMAL LOW (ref 3.87–5.11)
RDW: 11.9 % (ref 11.5–15.5)
WBC: 5.2 10*3/uL (ref 4.0–10.5)
nRBC: 0 % (ref 0.0–0.2)

## 2019-09-11 LAB — COMPREHENSIVE METABOLIC PANEL
ALT: 16 U/L (ref 0–44)
AST: 19 U/L (ref 15–41)
Albumin: 4.2 g/dL (ref 3.5–5.0)
Alkaline Phosphatase: 50 U/L (ref 38–126)
Anion gap: 8 (ref 5–15)
BUN: 44 mg/dL — ABNORMAL HIGH (ref 8–23)
CO2: 24 mmol/L (ref 22–32)
Calcium: 8.9 mg/dL (ref 8.9–10.3)
Chloride: 111 mmol/L (ref 98–111)
Creatinine, Ser: 2.06 mg/dL — ABNORMAL HIGH (ref 0.44–1.00)
GFR calc Af Amer: 27 mL/min — ABNORMAL LOW (ref >60)
GFR calc non Af Amer: 23 mL/min — ABNORMAL LOW (ref >60)
Glucose, Bld: 109 mg/dL — ABNORMAL HIGH (ref 70–99)
Potassium: 4 mmol/L (ref 3.5–5.1)
Sodium: 143 mmol/L (ref 135–145)
Total Bilirubin: 1.3 mg/dL — ABNORMAL HIGH (ref 0.3–1.2)
Total Protein: 6.8 g/dL (ref 6.5–8.1)

## 2019-09-11 NOTE — Progress Notes (Signed)
Pt got sob while she was visiting a friend in [place]. She had a scan that showed pulm. Nodles. She does not have sob at this time

## 2019-09-12 LAB — CEA: CEA: 3.7 ng/mL (ref 0.0–4.7)

## 2019-09-12 LAB — CANCER ANTIGEN 19-9: CA 19-9: 8 U/mL (ref 0–35)

## 2019-09-13 ENCOUNTER — Encounter: Payer: Self-pay | Admitting: Oncology

## 2019-09-13 NOTE — Progress Notes (Signed)
Hematology/Oncology Consult note [facility] Telephone:([phone] [phone]  Patient Care Team: Perrin Maltese, MD as PCP - General (Internal Medicine)   Name of the patient: [name]  [id number]  10/12/1945    Reason for referral-lung nodule   Referring physician-Dr. Laural Roes  Date of visit: 09/13/19   History of presenting illness- Patient is a 74 year old female who was seen by Dr. Lanney Gins for pulmonary nodule.  Patient was hospitalized for about a week in February 2021 in [place].  At that time she had a CT chest done which showed a lingular nodule of 1.1 cm.  This was followed by a PET CT scan which was compared to her CT chest that she had back in 2008.  She was noted to have a inferior lingular 1 cm nodule in the left upper lobe which was mildly hypermetabolic at 3.9 but similar in size to the one she had in 2008.  There were no other concerning findings of malignancy.  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  Patient has seen Dr. Dawna Part for this last in 2018.  Back then she had an MRI which showed a 3.2 x 2.8 cm lesion in the uncinate process.  Followed by an EUS with Dr. Mont Dutton in March 2018 which showed discordance with MRI size in the lesion to 1.7 x 1.6 cm.  FNA showed rare atypical cells among predominantly normal pancreas parenchyma.  There was no biochemical analysis of the cyst fluid.  She was asked to get a repeat MRI and a follow-up with Dr. Donnal Moat but she did not get it done since then.  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  Patient currently feels well and other than mild fatigue she denies any other complaints at this time.  Appetite and weight have remained stable.  She denies any abdominal pain  ECOG PS- 1  Pain scale- 0   Review of systems- Review of Systems  Constitutional: Positive for  malaise/fatigue. Negative for chills, fever and weight loss.  HENT: Negative for congestion, ear discharge and nosebleeds.   Eyes: Negative for blurred vision.  Respiratory: Negative for cough, hemoptysis, sputum production, shortness of breath and wheezing.   Cardiovascular: Negative for chest pain, palpitations, orthopnea and claudication.  Gastrointestinal: Negative for abdominal pain, blood in stool, constipation, diarrhea, heartburn, melena, nausea and vomiting.  Genitourinary: Negative for dysuria, flank pain, frequency, hematuria and urgency.  Musculoskeletal: Negative for back pain, joint pain and myalgias.  Skin: Negative for rash.  Neurological: Negative for dizziness, tingling, focal weakness, seizures, weakness and headaches.  Endo/Heme/Allergies: Does not bruise/bleed easily.  Psychiatric/Behavioral: Negative for depression and suicidal ideas. The patient does not have insomnia.     Allergies  Allergen Reactions  . Amlodipine Swelling  . Sulfa Antibiotics Itching    Patient Active Problem List   Diagnosis Date Noted  . Coronary disease 11/03/2017  . Diabetes ([place]) 11/03/2017  . Gout 11/03/2017  . Osteoarthritis 11/03/2017     Past Medical History:  Diagnosis Date  . Arthritis   . Cerebral aneurysm   . Chronic kidney disease   . Complication of anesthesia    "take a long time to awaken "  . Coronary artery disease   . Diabetes mellitus   . Headache(784.0)   . Heart murmur   . Hypertension   . Sleep apnea  does not use cpap  . Systemic lupus erythematosus ([place])      Past Surgical History:  Procedure Laterality Date  . ANEURYSM COILING  3/15  . BACK SURGERY    . BARTHOLIN CYST MARSUPIALIZATION    . BREAST CYST ASPIRATION Right 01/25/2012   FNA neg.  Marland Kitchen BREAST EXCISIONAL BIOPSY Left 06/26/2007   neg  . CARDIAC CATHETERIZATION  08   stents  . CEREBRAL ANEURYSM REPAIR  2008   A COM  . CEREBRAL ANEURYSM REPAIR  2008   R MCA  . CERVICAL SPINE SURGERY      . EYE SURGERY     cataract  . UPPER ESOPHAGEAL ENDOSCOPIC ULTRASOUND (EUS) N/A 04/15/2016   Procedure: UPPER ESOPHAGEAL ENDOSCOPIC ULTRASOUND (EUS);  Surgeon: Holly Bodily, MD;  Location: [facility] ENDOSCOPY;  Service: Gastroenterology;  Laterality: N/A;    Social History   Socioeconomic History  . Marital status: Widowed    Spouse name: Not on file  . Number of children: Not on file  . Years of education: Not on file  . Highest education level: Not on file  Occupational History  . Not on file  Tobacco Use  . Smoking status: Never Smoker  . Smokeless tobacco: Never Used  Substance and Sexual Activity  . Alcohol use: Yes    Comment: Occasionally at [place]  . Drug use: No  . Sexual activity: Not Currently    Birth control/protection: Post-menopausal  Other Topics Concern  . Not on file  Social History Narrative  . Not on file   Social Determinants of Health   Financial Resource Strain:   . Difficulty of Paying Living Expenses:   Food Insecurity:   . Worried About Charity fundraiser in the Last Year:   . Arboriculturist in the Last Year:   Transportation Needs:   . Film/video editor (Medical):   Marland Kitchen Lack of Transportation (Non-Medical):   Physical Activity:   . Days of Exercise per Week:   . Minutes of Exercise per Session:   Stress:   . Feeling of Stress :   Social Connections:   . Frequency of Communication with Friends and Family:   . Frequency of Social Gatherings with Friends and Family:   . Attends Religious Services:   . Active Member of Clubs or Organizations:   . Attends Archivist Meetings:   Marland Kitchen Marital Status:   Intimate Partner Violence:   . Fear of Current or Ex-Partner:   . Emotionally Abused:   Marland Kitchen Physically Abused:   . Sexually Abused:      Family History  Problem Relation Age of Onset  . Breast cancer Paternal Aunt 38  . Breast cancer Paternal Aunt   . Heart disease Mother   . Heart disease Father   . Stroke Father   .  Diabetes Sister   . Diabetes Brother      Current Outpatient Medications:  .  aspirin 81 MG chewable tablet, Chew 81 mg by mouth daily., Disp: , Rfl:  .  chlorhexidine (PERIDEX) 0.12 % solution, Use as directed 15 mLs in the mouth or throat 2 (two) times daily as needed., Disp: , Rfl:  .  Cholecalciferol (VITAMIN D3) 10000 UNITS capsule, Take 10,000 Units by mouth daily., Disp: , Rfl:  .  clopidogrel (PLAVIX) 75 MG tablet, Take 75 mg by mouth daily., Disp: , Rfl:  .  cyclobenzaprine (FLEXERIL) 5 MG tablet, Take 5 mg by mouth 3 (three) times daily as  needed for muscle spasms., Disp: , Rfl:  .  fexofenadine (ALLEGRA) 180 MG tablet, Take 180 mg by mouth daily as needed for allergies or rhinitis., Disp: , Rfl:  .  furosemide (LASIX) 40 MG tablet, Take 40 mg by mouth daily. , Disp: , Rfl:  .  gabapentin (NEURONTIN) 100 MG capsule, TAKE 1 CAPSULE BY MOUTH EVERYDAY AT BEDTIME, Disp: , Rfl:  .  gabapentin (NEURONTIN) 300 MG capsule, Take 300 mg by mouth 2 (two) times daily as needed., Disp: , Rfl:  .  glimepiride (AMARYL) 2 MG tablet, Take 3 mg by mouth 2 (two) times daily. , Disp: , Rfl:  .  hydroxychloroquine (PLAQUENIL) 200 MG tablet, TAKE 1 TABLET BY MOUTH daily, Disp: , Rfl:  .  hydroxychloroquine (PLAQUENIL) 200 MG tablet, Take by mouth daily., Disp: , Rfl:  .  ibandronate (BONIVA) 150 MG tablet, Take by mouth., Disp: , Rfl:  .  labetalol (NORMODYNE) 300 MG tablet, Take 300 mg by mouth 2 (two) times daily., Disp: , Rfl: 2 .  mometasone (NASONEX) 50 MCG/ACT nasal spray, SPRAY 2 SPRAYS EACH NOSTRIL DAILY, Disp: , Rfl:  .  ONETOUCH VERIO test strip, , Disp: , Rfl:  .  pantoprazole (PROTONIX) 40 MG tablet, Take 40 mg by mouth daily., Disp: , Rfl:  .  rosuvastatin (CRESTOR) 5 MG tablet, Take 5 mg by mouth at bedtime., Disp: , Rfl: 3 .  valsartan (DIOVAN) 320 MG tablet, Take 320 mg by mouth daily., Disp: , Rfl: 3 .  zolpidem (AMBIEN) 5 MG tablet, Take 5 mg by mouth at bedtime as needed. For sleep,  Disp: , Rfl:  .  atenolol (TENORMIN) 100 MG tablet, Take 100 mg by mouth daily., Disp: , Rfl:  .  hydrALAZINE (APRESOLINE) 50 MG tablet, Take 25 mg by mouth 2 (two) times daily., Disp: , Rfl:    Physical exam:  Vitals:   09/11/19 1056  BP: (!) 141/76  Pulse: 73  Temp: (!) 95.4 F (35.2 C)  TempSrc: Tympanic  SpO2: 100%  Weight: 175 lb 4.8 oz (79.5 kg)   Physical Exam Constitutional:      General: She is not in acute distress. HENT:     Head: Normocephalic and atraumatic.  Eyes:     Pupils: Pupils are equal, round, and reactive to light.  Cardiovascular:     Rate and Rhythm: Normal rate and regular rhythm.     Heart sounds: Normal heart sounds.  Pulmonary:     Effort: Pulmonary effort is normal.     Breath sounds: Normal breath sounds.  Abdominal:     General: Bowel sounds are normal.     Palpations: Abdomen is soft.  Musculoskeletal:     Cervical back: Normal range of motion.  Skin:    General: Skin is warm and dry.  Neurological:     Mental Status: She is alert and oriented to person, place, and time.        CMP Latest Ref Rng & Units 09/11/2019  Glucose 70 - 99 mg/dL 109(H)  BUN 8 - 23 mg/dL 44(H)  Creatinine 0.44 - 1.00 mg/dL 2.06(H)  Sodium 135 - 145 mmol/L 143  Potassium 3.5 - 5.1 mmol/L 4.0  Chloride 98 - 111 mmol/L 111  CO2 22 - 32 mmol/L 24  Calcium 8.9 - 10.3 mg/dL 8.9  Total Protein 6.5 - 8.1 g/dL 6.8  Total Bilirubin 0.3 - 1.2 mg/dL 1.3(H)  Alkaline Phos 38 - 126 U/L 50  AST 15 - 41 U/L  19  ALT 0 - 44 U/L 16   CBC Latest Ref Rng & Units 09/11/2019  WBC 4.0 - 10.5 K/uL 5.2  Hemoglobin 12.0 - 15.0 g/dL 11.1(L)  Hematocrit 36.0 - 46.0 % 34.2(L)  Platelets 150 - 400 K/uL 149(L)    No images are attached to the encounter.  NM PET Image Initial (PI) Skull Base To Thigh  Result Date: 08/28/2019 CLINICAL DATA:  Initial treatment strategy for solitary pulmonary nodule. Borderline diabetes. Born with 1 kidney. EXAM: NUCLEAR MEDICINE PET SKULL BASE TO  THIGH TECHNIQUE: 9.5 mCi F-18 FDG was injected intravenously. Full-ring PET imaging was performed from the skull base to thigh after the radiotracer. CT data was obtained and used for attenuation correction and anatomic localization. Fasting blood glucose: 122 mg/dl COMPARISON:  Chest CT of 07/06/2006. FINDINGS: Mediastinal blood pool activity: SUV max 2.9 Liver activity: SUV max NA NECK: Degraded evaluation of the neck secondary to motion and muscular activity. No gross cervical nodal hypermetabolism. Incidental CT findings: History of right-sided internal carotid artery aneurysm coiling. No cervical adenopathy. CHEST: No thoracic nodal hypermetabolism. An inferior lingular relatively well-circumscribed nodule measures 1.0 cm and a S.U.V. max of 3.9, including on 87/3. This is similar in size on image 28/3 of the 07/06/2006 CT. Incidental CT findings: Aortic atherosclerosis. Normal heart size, without pericardial effusion. Multivessel coronary artery atherosclerosis. ABDOMEN/PELVIS: No abdominopelvic parenchymal or nodal hypermetabolism. Incidental CT findings: Normal adrenal glands. Markedly atrophic left kidney with exophytic 8 mm low-density lesion, likely a cyst within. Low-density right renal lesions are also likely cysts. The largest is in the upper pole at 5.8 cm. Abdominal aortic atherosclerosis. Small gallstones. A cystic lesion with minimal wall calcification within the pancreatic uncinate process measures 2.5 cm on 149/3. Subtly present at 1.0 cm on 07/06/2006. Abdominal aortic atherosclerosis. Scattered colonic diverticula. SKELETON: No abnormal marrow activity. 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 Lumbar spine fixation. Incidental CT findings: none IMPRESSION: 1. Hypermetabolic left upper lobe pulmonary nodule, similar in size to the exam of 07/06/2006 and therefore benign. 2. 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 Per consensus criteria, this warrants further evaluation with pre and post contrast abdominal MRI/MRCP. 3. Markedly atrophic native left kidney. 4. Cholelithiasis. 5. Coronary artery atherosclerosis. Aortic Atherosclerosis (ICD10-I70.0). Electronically Signed   By: Abigail Miyamoto M.D.   On: 08/28/2019 15:50    Assessment and plan- Patient is a 74 y.o. female referred for lung nodule and pancreatic cyst  1.  Left lingular lung nodule: This has been present dating back to 2008 and even seen on MRI in 2017.  I therefore do not think that this requires any further follow-up such as a biopsy.  However I will discuss her case at tumor board next week and get back with Dr. Lanney Gins with my recommendations.  2.  Pancreatic cyst: Again this appears to have been worked up in 2018 as well when she had a EUS as well as biopsy which did not show any malignancy.  However it appears a little larger as compared to her prior CT in 2008.  I will proceed with an MRI with and without contrast pancreatic mass protocol [place] compared to her MRI done at [facility] in December 2017.  I will check a CEA and CA 19-9 today along with CBC with differential and CMP  I will see the patient in 2 weeks for a video visit to discuss consensus from  tumor board and further management   Thank you for this kind referral and the opportunity to participate in the care of this patient   Visit Diagnosis 1. Abnormal positron emission tomography (PET) scan   2. Pancreatic cyst   3. Lung nodule     Dr. Randa Evens, MD, MPH [facility] at [facility] [phone] 09/13/2019

## 2019-09-19 ENCOUNTER — Other Ambulatory Visit: Payer: Self-pay

## 2019-09-19 ENCOUNTER — Encounter: Payer: Self-pay | Admitting: Gastroenterology

## 2019-09-19 ENCOUNTER — Other Ambulatory Visit: Payer: Self-pay | Admitting: *Deleted

## 2019-09-19 ENCOUNTER — Ambulatory Visit (INDEPENDENT_AMBULATORY_CARE_PROVIDER_SITE_OTHER): Payer: Medicare Other | Admitting: Gastroenterology

## 2019-09-19 ENCOUNTER — Other Ambulatory Visit: Payer: Self-pay | Admitting: Oncology

## 2019-09-19 VITALS — BP 127/82 | HR 70 | Temp 98.0°F | Ht 63.0 in | Wt 172.0 lb

## 2019-09-19 DIAGNOSIS — R948 Abnormal results of function studies of other organs and systems: Secondary | ICD-10-CM

## 2019-09-19 DIAGNOSIS — K625 Hemorrhage of anus and rectum: Secondary | ICD-10-CM

## 2019-09-19 DIAGNOSIS — M329 Systemic lupus erythematosus, unspecified: Secondary | ICD-10-CM | POA: Insufficient documentation

## 2019-09-19 DIAGNOSIS — D649 Anemia, unspecified: Secondary | ICD-10-CM

## 2019-09-19 DIAGNOSIS — K862 Cyst of pancreas: Secondary | ICD-10-CM

## 2019-09-19 NOTE — Progress Notes (Signed)
Cephas Darby, MD [address]  [place], [place] [address]  Main: [phone]  Fax: [phone]    Gastroenterology Consultation  Referring Provider:     Ronny Bacon, MD Primary Care Physician:  Perrin Maltese, MD Primary Gastroenterologist:  Dr. Cephas Darby Reason for Consultation:   Rectal bleeding        HPI:   [name] is a 74 y.o. female referred by Dr. Perrin Maltese, MD  for consultation & management of rectal bleeding.  Patient reports that about 2 or 3 weeks ago, she noticed a gush of bright red blood per rectum which was painless.  She did not experience episodes since then.  She denies constipation but she thinks her stool consistency has changed.  She is also trying to lose weight by following portion diet and avoiding high calorie foods.  She lost about 5 to 6 pounds.  She denies abdominal pain, bloating, nausea, melena.  She does have normocytic anemia, last hemoglobin was 11.1.  She had remote history of B12 deficiency for which she is taking over-the-counter B12 supplements daily  NSAIDs: None  Antiplts/Anticoagulants/Anti thrombotics: Aspirin and Plavix for history of coronary artery disease s/p PCI in 2008  GI Procedures: Colonoscopy approximately 8 years ago, found to have polyps  Past Medical History:  Diagnosis Date  . Arthritis   . Cerebral aneurysm   . Chronic kidney disease   . Complication of anesthesia    "take a long time to awaken "  . Coronary artery disease   . Diabetes mellitus   . Headache(784.0)   . Heart murmur   . Hypertension   . Sleep apnea    does not use cpap  . Systemic lupus erythematosus ([place])     Past Surgical History:  Procedure Laterality Date  . ANEURYSM COILING  3/15  . BACK SURGERY    . BARTHOLIN CYST MARSUPIALIZATION    . BREAST CYST ASPIRATION Right 01/25/2012   FNA neg.  Marland Kitchen BREAST EXCISIONAL BIOPSY Left 06/26/2007   neg  . CARDIAC CATHETERIZATION  08   stents  . CEREBRAL  ANEURYSM REPAIR  2008   A COM  . CEREBRAL ANEURYSM REPAIR  2008   R MCA  . CERVICAL SPINE SURGERY    . EYE SURGERY     cataract  . UPPER ESOPHAGEAL ENDOSCOPIC ULTRASOUND (EUS) N/A 04/15/2016   Procedure: UPPER ESOPHAGEAL ENDOSCOPIC ULTRASOUND (EUS);  Surgeon: Holly Bodily, MD;  Location: [facility] ENDOSCOPY;  Service: Gastroenterology;  Laterality: N/A;    Current Outpatient Medications:  .  allopurinol (ZYLOPRIM) 300 MG tablet, Take 300 mg by mouth daily., Disp: , Rfl:  .  aspirin 81 MG chewable tablet, Chew 81 mg by mouth daily., Disp: , Rfl:  .  clopidogrel (PLAVIX) 75 MG tablet, Take 75 mg by mouth daily., Disp: , Rfl:  .  fexofenadine (ALLEGRA) 180 MG tablet, Take 180 mg by mouth daily as needed for allergies or rhinitis., Disp: , Rfl:  .  furosemide (LASIX) 40 MG tablet, Take 40 mg by mouth daily. , Disp: , Rfl:  .  gabapentin (NEURONTIN) 300 MG capsule, Take 300 mg by mouth 2 (two) times daily as needed., Disp: , Rfl:  .  glimepiride (AMARYL) 2 MG tablet, Take 3 mg by mouth 2 (two) times daily. , Disp: , Rfl:  .  hydroxychloroquine (PLAQUENIL) 200 MG tablet, TAKE 1 TABLET BY MOUTH daily, Disp: , Rfl:  .  ibandronate (BONIVA) 150  MG tablet, Take by mouth., Disp: , Rfl:  .  olmesartan (BENICAR) 40 MG tablet, Take 40 mg by mouth daily., Disp: , Rfl:  .  ONETOUCH VERIO test strip, , Disp: , Rfl:  .  pantoprazole (PROTONIX) 40 MG tablet, Take 40 mg by mouth daily., Disp: , Rfl:  .  rosuvastatin (CRESTOR) 5 MG tablet, Take 5 mg by mouth at bedtime., Disp: , Rfl: 3 .  valsartan (DIOVAN) 320 MG tablet, Take 320 mg by mouth daily., Disp: , Rfl: 3 .  zolpidem (AMBIEN) 5 MG tablet, Take 5 mg by mouth at bedtime as needed. For sleep, Disp: , Rfl:  .  atenolol (TENORMIN) 100 MG tablet, Take 100 mg by mouth daily., Disp: , Rfl:  .  chlorhexidine (PERIDEX) 0.12 % solution, Use as directed 15 mLs in the mouth or throat 2 (two) times daily as needed., Disp: , Rfl:  .  Cholecalciferol (VITAMIN  D3) 10000 UNITS capsule, Take 10,000 Units by mouth daily., Disp: , Rfl:  .  cyclobenzaprine (FLEXERIL) 5 MG tablet, Take 5 mg by mouth 3 (three) times daily as needed for muscle spasms., Disp: , Rfl:  .  gabapentin (NEURONTIN) 100 MG capsule, TAKE 1 CAPSULE BY MOUTH EVERYDAY AT BEDTIME, Disp: , Rfl:  .  hydrALAZINE (APRESOLINE) 50 MG tablet, Take 25 mg by mouth 2 (two) times daily., Disp: , Rfl:  .  labetalol (NORMODYNE) 300 MG tablet, Take 300 mg by mouth 2 (two) times daily., Disp: , Rfl: 2 .  mometasone (NASONEX) 50 MCG/ACT nasal spray, SPRAY 2 SPRAYS EACH NOSTRIL DAILY, Disp: , Rfl:    Family History  Problem Relation Age of Onset  . Breast cancer Paternal Aunt 54  . Breast cancer Paternal Aunt   . Heart disease Mother   . Heart disease Father   . Stroke Father   . Diabetes Sister   . Diabetes Brother      Social History   Tobacco Use  . Smoking status: Never Smoker  . Smokeless tobacco: Never Used  Substance Use Topics  . Alcohol use: Yes    Comment: Occasionally at [place]  . Drug use: No    Allergies as of 09/19/2019 - Review Complete 09/19/2019  Allergen Reaction Noted  . Amlodipine Swelling 07/23/2011  . Sulfa antibiotics Itching 01/13/2012    Review of Systems:    All systems reviewed and negative except where noted in HPI.   Physical Exam:  BP 127/82   Pulse 70   Temp 98 F (36.7 C) (Temporal)   Ht 5\' 3"  (1.6 m)   Wt 172 lb (78 kg)   BMI 30.47 kg/m  No LMP recorded. Patient is postmenopausal.  General:   Alert,  Well-developed, well-nourished, pleasant and cooperative in NAD Head:  Normocephalic and atraumatic. Eyes:  Sclera clear, no icterus.   Conjunctiva pink. Ears:  Normal auditory acuity. Nose:  No deformity, discharge, or lesions. Mouth:  No deformity or lesions,oropharynx pink & moist. Neck:  Supple; no masses or thyromegaly. Lungs:  Respirations even and unlabored.  Clear throughout to auscultation.   No wheezes, crackles, or rhonchi. No  acute distress. Heart:  Regular rate and rhythm; no murmurs, clicks, rubs, or gallops. Abdomen:  Normal bowel sounds. Soft, obese, non-tender and non-distended without masses, hepatosplenomegaly or hernias noted.  No guarding or rebound tenderness.   Rectal: Not performed Msk:  Symmetrical without gross deformities. Good, equal movement & strength bilaterally. Pulses:  Normal pulses noted. Extremities:  No clubbing or edema.  No cyanosis. Neurologic:  Alert and oriented x3;  grossly normal neurologically. Skin:  Intact without significant lesions or rashes. No jaundice. Psych:  Alert and cooperative. Normal mood and affect.  Imaging Studies: Reviewed  Assessment and Plan:   ALISAN DOKES is a 74 y.o. female with metabolic syndrome, diabetes, coronary artery disease s/p PCI on aspirin and Plavix is seen in consultation for rectal bleeding  Rectal bleeding, painless Given age and last colonoscopy more than 5 years ago, recommend diagnostic colonoscopy If the colonoscopy is unremarkable, discussed with her about hemorrhoid ligation, for this patient needs to be off Plavix We will obtain preop clearance for colonoscopy as well interruption of Plavix for the procedure from her cardiologist  Normocytic anemia Check iron panel, B12 and folate panel   Follow up in 4 to 6 weeks   Cephas Darby, MD

## 2019-09-20 ENCOUNTER — Other Ambulatory Visit: Payer: Self-pay

## 2019-09-20 DIAGNOSIS — D649 Anemia, unspecified: Secondary | ICD-10-CM

## 2019-09-20 DIAGNOSIS — K625 Hemorrhage of anus and rectum: Secondary | ICD-10-CM

## 2019-09-22 ENCOUNTER — Other Ambulatory Visit: Payer: Self-pay

## 2019-09-22 ENCOUNTER — Ambulatory Visit
Admission: RE | Admit: 2019-09-22 | Discharge: 2019-09-22 | Disposition: A | Discharge status: Home / Self Care | Payer: Medicare Other | Source: Ambulatory Visit | Attending: Oncology | Admitting: Oncology

## 2019-09-22 ENCOUNTER — Ambulatory Visit: Admission: RE | Admit: 2019-09-22 | Payer: Medicare Other | Source: Ambulatory Visit

## 2019-09-22 DIAGNOSIS — R948 Abnormal results of function studies of other organs and systems: Secondary | ICD-10-CM | POA: Insufficient documentation

## 2019-09-22 DIAGNOSIS — K862 Cyst of pancreas: Secondary | ICD-10-CM

## 2019-09-24 ENCOUNTER — Telehealth: Payer: Self-pay

## 2019-09-24 ENCOUNTER — Encounter: Payer: Self-pay | Admitting: Oncology

## 2019-09-24 ENCOUNTER — Inpatient Hospital Stay: Payer: Medicare Other | Attending: Oncology | Admitting: Oncology

## 2019-09-24 DIAGNOSIS — K862 Cyst of pancreas: Secondary | ICD-10-CM | POA: Diagnosis not present

## 2019-09-24 NOTE — Progress Notes (Signed)
Patient will be doing virtual visit and she is doing well no major complaints

## 2019-09-24 NOTE — Telephone Encounter (Signed)
Pt notified ok'd per Dr. Lamonte Sakai, she can stop Plavix 75mg  5 days prior to procedure which is is currently scheduled for April 15th. Pt can restart 1 day after and was advised to continue daily ASA 81mg . Pt verbalized understanding.

## 2019-09-24 NOTE — Telephone Encounter (Signed)
[facility] but had to leave a voicemail requesting patient's MRI from 06/09/2016 to be uploaded through [facility].  Fax: [phone] Phone: [phone] and [phone]

## 2019-09-26 ENCOUNTER — Encounter: Payer: Self-pay | Admitting: Gastroenterology

## 2019-09-26 ENCOUNTER — Other Ambulatory Visit: Payer: Self-pay

## 2019-09-26 NOTE — Telephone Encounter (Signed)
Called [facility] radiology again and left them a second message so they could pull through patient's MRI from 06/09/16 to be compared to our recent one. Awaiting on a call or for images.

## 2019-09-27 ENCOUNTER — Other Ambulatory Visit: Payer: Medicare Other

## 2019-09-27 NOTE — Progress Notes (Signed)
I connected with [name] on 09/27/19 at  2:30 PM EDT by video enabled telemedicine visit and verified that I am speaking with the correct person using two identifiers.   I discussed the limitations, risks, security and privacy concerns of performing an evaluation and management service by telemedicine and the availability of in-person appointments. I also discussed with the patient that there may be a patient responsible charge related to this service. The patient expressed understanding and agreed to proceed.  Other persons participating in the visit and their role in the encounter:  none  Patient's location:  home Provider's location:  work   Diagnosis-lung nodule and pancreatic cyst  Chief complaint/ Reason for visit-discussed MRI results and further management  Heme/Onc history: Patient is a 74 year old female who was seen by Dr. Lanney Gins for pulmonary nodule.  Patient was hospitalized for about a week in February 2021 in [place].  At that time she had a CT chest done which showed a lingular nodule of 1.1 cm.  This was followed by a PET CT scan which was compared to her CT chest that she had back in 2008.  She was noted to have a inferior lingular 1 cm nodule in the left upper lobe which was mildly hypermetabolic at 3.9 but similar in size to the one she had in 2008.  There were no other concerning findings of malignancy.  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  Patient has seen Dr. Dawna Part for this last in 2018.  Back then she had an MRI which showed a 3.2 x 2.8 cm lesion in the uncinate process.  Followed by an EUS with Dr. Mont Dutton in March 2018 which showed discordance with MRI size in the lesion to 1.7 x 1.6 cm.  FNA showed rare atypical cells among predominantly normal pancreas parenchyma.  There was no biochemical analysis of the cyst fluid.  She was asked to get a repeat MRI and a follow-up with Dr. Donnal Moat but she did  not get it done since then    Review of Systems  Constitutional: Positive for malaise/fatigue. Negative for chills, fever and weight loss.  HENT: Negative for congestion, ear discharge and nosebleeds.   Eyes: Negative for blurred vision.  Respiratory: Negative for cough, hemoptysis, sputum production, shortness of breath and wheezing.   Cardiovascular: Negative for chest pain, palpitations, orthopnea and claudication.  Gastrointestinal: Negative for abdominal pain, blood in stool, constipation, diarrhea, heartburn, melena, nausea and vomiting.  Genitourinary: Negative for dysuria, flank pain, frequency, hematuria and urgency.  Musculoskeletal: Negative for back pain, joint pain and myalgias.  Skin: Negative for rash.  Neurological: Negative for dizziness, tingling, focal weakness, seizures, weakness and headaches.  Endo/Heme/Allergies: Does not bruise/bleed easily.  Psychiatric/Behavioral: Negative for depression and suicidal ideas. The patient does not have insomnia.     Allergies  Allergen Reactions  . Amlodipine Swelling  . Sulfa Antibiotics Itching    Past Medical History:  Diagnosis Date  . Arthritis   . Cerebral aneurysm   . Chronic kidney disease   . Complication of anesthesia    "take a long time to awaken "  . Coronary artery disease   . Diabetes mellitus   . Headache(784.0)   . Heart murmur   . HOH (hard of hearing)    Has hearing aids, doesn't wear  . Hypertension   . Neck stiffness    screws in neck.  Some limitations of movement  .  Sleep apnea    does not use cpap  . Systemic lupus erythematosus ([place])   . Wears dentures    partial lower    Past Surgical History:  Procedure Laterality Date  . ANEURYSM COILING  3/15  . BACK SURGERY    . BARTHOLIN CYST MARSUPIALIZATION    . BREAST CYST ASPIRATION Right 01/25/2012   FNA neg.  Marland Kitchen BREAST EXCISIONAL BIOPSY Left 06/26/2007   neg  . CARDIAC CATHETERIZATION  08   stents  . CEREBRAL ANEURYSM REPAIR  2008   A  COM  . CEREBRAL ANEURYSM REPAIR  2008   R MCA  . CERVICAL SPINE SURGERY    . EYE SURGERY     cataract  . UPPER ESOPHAGEAL ENDOSCOPIC ULTRASOUND (EUS) N/A 04/15/2016   Procedure: UPPER ESOPHAGEAL ENDOSCOPIC ULTRASOUND (EUS);  Surgeon: Holly Bodily, MD;  Location: [facility] ENDOSCOPY;  Service: Gastroenterology;  Laterality: N/A;    Social History   Socioeconomic History  . Marital status: Widowed    Spouse name: Not on file  . Number of children: Not on file  . Years of education: Not on file  . Highest education level: Not on file  Occupational History  . Not on file  Tobacco Use  . Smoking status: Never Smoker  . Smokeless tobacco: Never Used  Substance and Sexual Activity  . Alcohol use: Yes    Comment: Occasionally at [employer] [place]  . Drug use: No  . Sexual activity: Not Currently    Birth control/protection: Post-menopausal  Other Topics Concern  . Not on file  Social History Narrative  . Not on file   Social Determinants of Health   Financial Resource Strain:   . Difficulty of Paying Living Expenses:   Food Insecurity:   . Worried About Charity fundraiser in the Last Year:   . Arboriculturist in the Last Year:   Transportation Needs:   . Film/video editor (Medical):   Marland Kitchen Lack of Transportation (Non-Medical):   Physical Activity:   . Days of Exercise per Week:   . Minutes of Exercise per Session:   Stress:   . Feeling of Stress :   Social Connections:   . Frequency of Communication with Friends and Family:   . Frequency of Social Gatherings with Friends and Family:   . Attends Religious Services:   . Active Member of Clubs or Organizations:   . Attends Archivist Meetings:   Marland Kitchen Marital Status:   Intimate Partner Violence:   . Fear of Current or Ex-Partner:   . Emotionally Abused:   Marland Kitchen Physically Abused:   . Sexually Abused:     Family History  Problem Relation Age of Onset  . Breast cancer Paternal Aunt 17  . Breast cancer Paternal Aunt    . Heart disease Mother   . Heart disease Father   . Stroke Father   . Diabetes Sister   . Diabetes Brother      Current Outpatient Medications:  .  allopurinol (ZYLOPRIM) 300 MG tablet, Take 300 mg by mouth daily., Disp: , Rfl:  .  aspirin 81 MG chewable tablet, Chew 81 mg by mouth daily., Disp: , Rfl:  .  atenolol (TENORMIN) 100 MG tablet, Take 100 mg by mouth daily., Disp: , Rfl:  .  chlorhexidine (PERIDEX) 0.12 % solution, Use as directed 15 mLs in the mouth or throat 2 (two) times daily as needed., Disp: , Rfl:  .  clopidogrel (PLAVIX) 75 MG tablet,  Take 75 mg by mouth daily., Disp: , Rfl:  .  cyclobenzaprine (FLEXERIL) 5 MG tablet, Take 5 mg by mouth 3 (three) times daily as needed for muscle spasms., Disp: , Rfl:  .  fexofenadine (ALLEGRA) 180 MG tablet, Take 180 mg by mouth daily as needed for allergies or rhinitis., Disp: , Rfl:  .  furosemide (LASIX) 40 MG tablet, Take 40 mg by mouth daily. , Disp: , Rfl:  .  gabapentin (NEURONTIN) 100 MG capsule, TAKE 1 CAPSULE BY MOUTH EVERYDAY AT BEDTIME, Disp: , Rfl:  .  gabapentin (NEURONTIN) 300 MG capsule, Take 300 mg by mouth 2 (two) times daily as needed., Disp: , Rfl:  .  glimepiride (AMARYL) 2 MG tablet, Take 3 mg by mouth daily. , Disp: , Rfl:  .  hydroxychloroquine (PLAQUENIL) 200 MG tablet, TAKE 1 TABLET BY MOUTH daily, Disp: , Rfl:  .  ibandronate (BONIVA) 150 MG tablet, Take by mouth., Disp: , Rfl:  .  labetalol (NORMODYNE) 300 MG tablet, Take 300 mg by mouth 2 (two) times daily., Disp: , Rfl: 2 .  mometasone (NASONEX) 50 MCG/ACT nasal spray, SPRAY 2 SPRAYS EACH NOSTRIL DAILY, Disp: , Rfl:  .  olmesartan (BENICAR) 40 MG tablet, Take 40 mg by mouth daily., Disp: , Rfl:  .  ONETOUCH VERIO test strip, , Disp: , Rfl:  .  pantoprazole (PROTONIX) 40 MG tablet, Take 40 mg by mouth daily., Disp: , Rfl:  .  rosuvastatin (CRESTOR) 5 MG tablet, Take 5 mg by mouth at bedtime., Disp: , Rfl: 3 .  valsartan (DIOVAN) 320 MG tablet, Take 320 mg  by mouth daily., Disp: , Rfl: 3 .  zolpidem (AMBIEN) 5 MG tablet, Take 5 mg by mouth at bedtime as needed. For sleep, Disp: , Rfl:  .  Cholecalciferol (VITAMIN D3) 10000 UNITS capsule, Take 10,000 Units by mouth daily., Disp: , Rfl:  .  hydrALAZINE (APRESOLINE) 50 MG tablet, Take 25 mg by mouth 2 (two) times daily., Disp: , Rfl:   MR ABDOMEN WO CONTRAST  Result Date: 09/22/2019 CLINICAL DATA:  Characterize cystic pancreatic lesion identified by prior PET-CT EXAM: MRI ABDOMEN WITHOUT CONTRAST TECHNIQUE: Multiplanar multisequence MR imaging was performed without the administration of intravenous contrast. COMPARISON:  PET-CT, 08/28/2019 FINDINGS: Lower chest: No acute findings. Hepatobiliary: No mass or other parenchymal abnormality identified. Small gallstones in the gallbladder. Pancreas: There is a partially exophytic multi-cystic of the pancreatic head and uncinate, measuring 2.5 x 2.2 x 2.5 cm. Incidental note of pancreatic divisum. Spleen:  Within normal limits in size and appearance. Adrenals/Urinary Tract: Very atrophic left kidney. Numerous cystic lesions of the right kidney, largest of which are exophytic, some of which demonstrate intrinsic T1 hyperintensity consistent with hemorrhagic or proteinaceous cysts. Incidental benign subcentimeter right adrenal adenoma. Stomach/Bowel: Visualized portions within the abdomen are unremarkable. Vascular/Lymphatic: No pathologically enlarged lymph nodes identified. No abdominal aortic aneurysm demonstrated. Other:  None. Musculoskeletal: No suspicious bone lesions identified. IMPRESSION: 1. 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 Enhancement characteristics are not assessed on this noncontrast examination. 2. Numerous cystic lesions of the right kidney. Contrast enhancement is not assessed. 3.  Cholelithiasis. 4. Atrophic left kidney,  consistent with prior vascular or obstructive insult. Electronically Signed   By: Eddie Candle M.D.   On: 09/22/2019 13:33   NM PET Image Initial (PI) Skull Base To Thigh  Result Date: 08/28/2019  CLINICAL DATA:  Initial treatment strategy for solitary pulmonary nodule. Borderline diabetes. Born with 1 kidney. EXAM: NUCLEAR MEDICINE PET SKULL BASE TO THIGH TECHNIQUE: 9.5 mCi F-18 FDG was injected intravenously. Full-ring PET imaging was performed from the skull base to thigh after the radiotracer. CT data was obtained and used for attenuation correction and anatomic localization. Fasting blood glucose: 122 mg/dl COMPARISON:  Chest CT of 07/06/2006. FINDINGS: Mediastinal blood pool activity: SUV max 2.9 Liver activity: SUV max NA NECK: Degraded evaluation of the neck secondary to motion and muscular activity. No gross cervical nodal hypermetabolism. Incidental CT findings: History of right-sided internal carotid artery aneurysm coiling. No cervical adenopathy. CHEST: No thoracic nodal hypermetabolism. An inferior lingular relatively well-circumscribed nodule measures 1.0 cm and a S.U.V. max of 3.9, including on 87/3. This is similar in size on image 28/3 of the 07/06/2006 CT. Incidental CT findings: Aortic atherosclerosis. Normal heart size, without pericardial effusion. Multivessel coronary artery atherosclerosis. ABDOMEN/PELVIS: No abdominopelvic parenchymal or nodal hypermetabolism. Incidental CT findings: Normal adrenal glands. Markedly atrophic left kidney with exophytic 8 mm low-density lesion, likely a cyst within. Low-density right renal lesions are also likely cysts. The largest is in the upper pole at 5.8 cm. Abdominal aortic atherosclerosis. Small gallstones. A cystic lesion with minimal wall calcification within the pancreatic uncinate process measures 2.5 cm on 149/3. Subtly present at 1.0 cm on 07/06/2006. Abdominal aortic atherosclerosis. Scattered colonic diverticula. SKELETON: No abnormal marrow  activity. 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 Lumbar spine fixation. Incidental CT findings: none IMPRESSION: 1. Hypermetabolic left upper lobe pulmonary nodule, similar in size to the exam of 07/06/2006 and therefore benign. 2. 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 Per consensus criteria, this warrants further evaluation with pre and post contrast abdominal MRI/MRCP. 3. Markedly atrophic native left kidney. 4. Cholelithiasis. 5. Coronary artery atherosclerosis. Aortic Atherosclerosis (ICD10-I70.0). Electronically Signed   By: Abigail Miyamoto M.D.   On: 08/28/2019 15:50    No images are attached to the encounter.   CMP Latest Ref Rng & Units 09/11/2019  Glucose 70 - 99 mg/dL 109(H)  BUN 8 - 23 mg/dL 44(H)  Creatinine 0.44 - 1.00 mg/dL 2.06(H)  Sodium 135 - 145 mmol/L 143  Potassium 3.5 - 5.1 mmol/L 4.0  Chloride 98 - 111 mmol/L 111  CO2 22 - 32 mmol/L 24  Calcium 8.9 - 10.3 mg/dL 8.9  Total Protein 6.5 - 8.1 g/dL 6.8  Total Bilirubin 0.3 - 1.2 mg/dL 1.3(H)  Alkaline Phos 38 - 126 U/L 50  AST 15 - 41 U/L 19  ALT 0 - 44 U/L 16   CBC Latest Ref Rng & Units 09/11/2019  WBC 4.0 - 10.5 K/uL 5.2  Hemoglobin 12.0 - 15.0 g/dL 11.1(L)  Hematocrit 36.0 - 46.0 % 34.2(L)  Platelets 150 - 400 K/uL 149(L)     Observation/objective: Appears in no acute distress of a video visit today.  Breathing is nonlabored  Assessment and plan: Patient is a 74 year old female referred for lingular nodule and pancreatic cyst and is here to discuss the results of the MRI and further management   1.  Left lingular nodule: This appears to be stable since 2008 and labs.  No need for further work-up at this time.  I will be reviewing her PET CT scan at tumor board this week.    2.  Pancreatic cyst: We need to get the MRI from 2017 done at  [facility] for comparison to see if there has been any increase in the  size.  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   follow-up instructions: To be decided tomorrow discussion  I discussed the assessment and treatment plan with the patient. The patient was provided an opportunity to ask questions and all were answered. The patient agreed with the plan and demonstrated an understanding of the instructions.   The patient was advised to call back or seek an in-person evaluation if the symptoms worsen or if the condition fails to improve as anticipated.   Visit Diagnosis: 1. Pancreatic cyst     Dr. Randa Evens, MD, MPH [facility] Tel- [phone] 09/27/2019 6:03 AM

## 2019-10-02 ENCOUNTER — Other Ambulatory Visit
Admission: RE | Admit: 2019-10-02 | Discharge: 2019-10-02 | Disposition: A | Discharge status: Home / Self Care | Payer: Medicare Other | Source: Ambulatory Visit | Attending: Gastroenterology | Admitting: Gastroenterology

## 2019-10-02 DIAGNOSIS — Z01812 Encounter for preprocedural laboratory examination: Secondary | ICD-10-CM | POA: Insufficient documentation

## 2019-10-02 DIAGNOSIS — Z20822 Contact with and (suspected) exposure to covid-19: Secondary | ICD-10-CM | POA: Diagnosis not present

## 2019-10-02 LAB — SARS CORONAVIRUS 2 (TAT 6-24 HRS): SARS Coronavirus 2: NEGATIVE

## 2019-10-03 ENCOUNTER — Encounter: Payer: Self-pay | Admitting: *Deleted

## 2019-10-03 ENCOUNTER — Other Ambulatory Visit: Payer: Self-pay | Admitting: Pulmonary Disease

## 2019-10-03 DIAGNOSIS — R911 Solitary pulmonary nodule: Secondary | ICD-10-CM

## 2019-10-03 NOTE — Progress Notes (Signed)
I have called [employer] and as of today it has not been powered share by [facility]. I typed a letter and requested it today and faxed it to power share with [facility].

## 2019-10-03 NOTE — Discharge Instructions (Signed)
General Anesthesia, Adult, Care After This sheet gives you information about how to care for yourself after your procedure. Your health care provider may also give you more specific instructions. If you have problems or questions, contact your health care provider. What can I expect after the procedure? After the procedure, the following side effects are common:  Pain or discomfort at the IV site.  Nausea.  Vomiting.  Sore throat.  Trouble concentrating.  Feeling cold or chills.  Weak or tired.  Sleepiness and fatigue.  Soreness and body aches. These side effects can affect parts of the body that were not involved in surgery. Follow these instructions at home:  For at least 24 hours after the procedure:  Have a responsible adult stay with you. It is important to have someone help care for you until you are awake and alert.  Rest as needed.  Do not: ? Participate in activities in which you could fall or become injured. ? Drive. ? Use heavy machinery. ? Drink alcohol. ? Take sleeping pills or medicines that cause drowsiness. ? Make important decisions or sign legal documents. ? Take care of children on your own. Eating and drinking  Follow any instructions from your health care provider about eating or drinking restrictions.  When you feel hungry, start by eating small amounts of foods that are soft and easy to digest (bland), such as toast. Gradually return to your regular diet.  Drink enough fluid to keep your urine pale yellow.  If you vomit, rehydrate by drinking water, juice, or clear broth. General instructions  If you have sleep apnea, surgery and certain medicines can increase your risk for breathing problems. Follow instructions from your health care provider about wearing your sleep device: ? Anytime you are sleeping, including during daytime naps. ? While taking prescription pain medicines, sleeping medicines, or medicines that make you drowsy.  Return to  your normal activities as told by your health care provider. Ask your health care provider what activities are safe for you.  Take over-the-counter and prescription medicines only as told by your health care provider.  If you smoke, do not smoke without supervision.  Keep all follow-up visits as told by your health care provider. This is important. Contact a health care provider if:  You have nausea or vomiting that does not get better with medicine.  You cannot eat or drink without vomiting.  You have pain that does not get better with medicine.  You are unable to pass urine.  You develop a skin rash.  You have a fever.  You have redness around your IV site that gets worse. Get help right away if:  You have difficulty breathing.  You have chest pain.  You have blood in your urine or stool, or you vomit blood. Summary  After the procedure, it is common to have a sore throat or nausea. It is also common to feel tired.  Have a responsible adult stay with you for the first 24 hours after general anesthesia. It is important to have someone help care for you until you are awake and alert.  When you feel hungry, start by eating small amounts of foods that are soft and easy to digest (bland), such as toast. Gradually return to your regular diet.  Drink enough fluid to keep your urine pale yellow.  Return to your normal activities as told by your health care provider. Ask your health care provider what activities are safe for you. This information is not   intended to replace advice given to you by your health care provider. Make sure you discuss any questions you have with your health care provider. Document Revised: 06/10/2017 Document Reviewed: 01/21/2017 Elsevier Patient Education  2020 [employer]  

## 2019-10-04 ENCOUNTER — Encounter
Admission: RE | Disposition: A | Discharge status: Home / Self Care | Payer: Self-pay | Source: Home / Self Care | Attending: Gastroenterology

## 2019-10-04 ENCOUNTER — Ambulatory Visit: Payer: Medicare Other | Admitting: Anesthesiology

## 2019-10-04 ENCOUNTER — Ambulatory Visit
Admission: RE | Admit: 2019-10-04 | Discharge: 2019-10-04 | Disposition: A | Discharge status: Home / Self Care | Payer: Self-pay | Source: Ambulatory Visit | Attending: Oncology | Admitting: Oncology

## 2019-10-04 ENCOUNTER — Encounter: Payer: Self-pay | Admitting: Gastroenterology

## 2019-10-04 ENCOUNTER — Other Ambulatory Visit: Payer: Self-pay

## 2019-10-04 ENCOUNTER — Ambulatory Visit
Admission: RE | Admit: 2019-10-04 | Discharge: 2019-10-04 | Disposition: A | Discharge status: Home / Self Care | Payer: Medicare Other | Attending: Gastroenterology | Admitting: Gastroenterology

## 2019-10-04 ENCOUNTER — Other Ambulatory Visit: Payer: Self-pay | Admitting: *Deleted

## 2019-10-04 DIAGNOSIS — I129 Hypertensive chronic kidney disease with stage 1 through stage 4 chronic kidney disease, or unspecified chronic kidney disease: Secondary | ICD-10-CM | POA: Diagnosis not present

## 2019-10-04 DIAGNOSIS — K625 Hemorrhage of anus and rectum: Secondary | ICD-10-CM | POA: Insufficient documentation

## 2019-10-04 DIAGNOSIS — M1990 Unspecified osteoarthritis, unspecified site: Secondary | ICD-10-CM | POA: Diagnosis not present

## 2019-10-04 DIAGNOSIS — Z823 Family history of stroke: Secondary | ICD-10-CM | POA: Insufficient documentation

## 2019-10-04 DIAGNOSIS — R911 Solitary pulmonary nodule: Secondary | ICD-10-CM

## 2019-10-04 DIAGNOSIS — Z888 Allergy status to other drugs, medicaments and biological substances status: Secondary | ICD-10-CM | POA: Insufficient documentation

## 2019-10-04 DIAGNOSIS — Z79899 Other long term (current) drug therapy: Secondary | ICD-10-CM | POA: Insufficient documentation

## 2019-10-04 DIAGNOSIS — Z7982 Long term (current) use of aspirin: Secondary | ICD-10-CM | POA: Insufficient documentation

## 2019-10-04 DIAGNOSIS — D120 Benign neoplasm of cecum: Secondary | ICD-10-CM | POA: Diagnosis not present

## 2019-10-04 DIAGNOSIS — E1122 Type 2 diabetes mellitus with diabetic chronic kidney disease: Secondary | ICD-10-CM | POA: Insufficient documentation

## 2019-10-04 DIAGNOSIS — D649 Anemia, unspecified: Secondary | ICD-10-CM

## 2019-10-04 DIAGNOSIS — Z8249 Family history of ischemic heart disease and other diseases of the circulatory system: Secondary | ICD-10-CM | POA: Insufficient documentation

## 2019-10-04 DIAGNOSIS — Z882 Allergy status to sulfonamides status: Secondary | ICD-10-CM | POA: Insufficient documentation

## 2019-10-04 DIAGNOSIS — K635 Polyp of colon: Secondary | ICD-10-CM

## 2019-10-04 DIAGNOSIS — N189 Chronic kidney disease, unspecified: Secondary | ICD-10-CM | POA: Insufficient documentation

## 2019-10-04 DIAGNOSIS — K644 Residual hemorrhoidal skin tags: Secondary | ICD-10-CM | POA: Insufficient documentation

## 2019-10-04 DIAGNOSIS — K862 Cyst of pancreas: Secondary | ICD-10-CM

## 2019-10-04 DIAGNOSIS — R011 Cardiac murmur, unspecified: Secondary | ICD-10-CM | POA: Diagnosis not present

## 2019-10-04 DIAGNOSIS — Z803 Family history of malignant neoplasm of breast: Secondary | ICD-10-CM | POA: Insufficient documentation

## 2019-10-04 DIAGNOSIS — K5730 Diverticulosis of large intestine without perforation or abscess without bleeding: Secondary | ICD-10-CM | POA: Insufficient documentation

## 2019-10-04 DIAGNOSIS — Z833 Family history of diabetes mellitus: Secondary | ICD-10-CM | POA: Insufficient documentation

## 2019-10-04 DIAGNOSIS — I2510 Atherosclerotic heart disease of native coronary artery without angina pectoris: Secondary | ICD-10-CM | POA: Insufficient documentation

## 2019-10-04 DIAGNOSIS — M3214 Glomerular disease in systemic lupus erythematosus: Secondary | ICD-10-CM | POA: Insufficient documentation

## 2019-10-04 DIAGNOSIS — G4730 Sleep apnea, unspecified: Secondary | ICD-10-CM | POA: Insufficient documentation

## 2019-10-04 HISTORY — DX: Unspecified hearing loss, unspecified ear: H91.90

## 2019-10-04 HISTORY — PX: POLYPECTOMY: SHX5525

## 2019-10-04 HISTORY — DX: Presence of dental prosthetic device (complete) (partial): Z97.2

## 2019-10-04 HISTORY — PX: COLONOSCOPY WITH PROPOFOL: SHX5780

## 2019-10-04 HISTORY — DX: Torticollis: M43.6

## 2019-10-04 LAB — GLUCOSE, CAPILLARY
Glucose-Capillary: 113 mg/dL — ABNORMAL HIGH (ref 70–99)
Glucose-Capillary: 115 mg/dL — ABNORMAL HIGH (ref 70–99)

## 2019-10-04 SURGERY — COLONOSCOPY WITH PROPOFOL
Anesthesia: General | Site: Rectum

## 2019-10-04 MED ORDER — LACTATED RINGERS IV SOLN
10.0000 mL/h | INTRAVENOUS | Status: DC
  Administered 2019-10-04 (×2): 10 mL/h via INTRAVENOUS

## 2019-10-04 MED ORDER — LIDOCAINE HCL (CARDIAC) PF 100 MG/5ML IV SOSY
PREFILLED_SYRINGE | INTRAVENOUS | Status: DC | PRN
  Administered 2019-10-04: 20 mg via INTRAVENOUS

## 2019-10-04 MED ORDER — STERILE WATER FOR IRRIGATION IR SOLN
Status: DC | PRN
  Administered 2019-10-04: 100 mL

## 2019-10-04 MED ORDER — PROPOFOL 10 MG/ML IV BOLUS
INTRAVENOUS | Status: DC | PRN
  Administered 2019-10-04: 80 mg via INTRAVENOUS
  Administered 2019-10-04 (×7): 20 mg via INTRAVENOUS

## 2019-10-04 MED ORDER — SODIUM CHLORIDE 0.9 % IV SOLN: INTRAVENOUS | Status: DC

## 2019-10-04 SURGICAL SUPPLY — 6 items
CANISTER SUCT 1200ML W/VALVE (MISCELLANEOUS) ×2 IMPLANT
FORCEPS BIOP RAD 4 LRG CAP 4 (CUTTING FORCEPS) ×1 IMPLANT
GOWN CVR UNV OPN BCK APRN NK (MISCELLANEOUS) ×2 IMPLANT
GOWN ISOL THUMB LOOP REG UNIV (MISCELLANEOUS) ×4
KIT ENDO PROCEDURE OLY (KITS) ×2 IMPLANT
WATER STERILE IRR 250ML POUR (IV SOLUTION) ×2 IMPLANT

## 2019-10-04 NOTE — Anesthesia Procedure Notes (Signed)
Procedure Name: MAC Date/Time: 10/04/2019 8:13 AM Performed by: Vanetta Shawl, CRNA Pre-anesthesia Checklist: Patient identified, Emergency Drugs available, Suction available, Timeout performed and Patient being monitored Patient Re-evaluated:Patient Re-evaluated prior to induction Oxygen Delivery Method: Nasal cannula Placement Confirmation: positive ETCO2

## 2019-10-04 NOTE — Op Note (Signed)
[facility] Patient Name: [name] Procedure Date: 10/04/2019 7:22 AM MRN: [id number] Account #: [id number] Date of Birth: [birth year] Admit Type: Outpatient Age: 74 Room: [facility] OR ROOM 01 Gender: Female Note Status: Finalized Procedure:             Colonoscopy Indications:           Rectal bleeding Providers:             Lin Landsman MD, MD Referring MD:          Melody Comas, MD (Referring MD) Medicines:             Monitored Anesthesia Care Complications:         No immediate complications. Estimated blood loss: None. Procedure:             Pre-Anesthesia Assessment:                        - Prior to the procedure, a History and Physical was                         performed, and patient medications and allergies were                         reviewed. The patient is competent. The risks and                         benefits of the procedure and the sedation options and                         risks were discussed with the patient. All questions                         were answered and informed consent was obtained.                         Patient identification and proposed procedure were                         verified by the physician, the nurse, the                         anesthesiologist, the anesthetist and the technician                         in the pre-procedure area in the procedure room in the                         endoscopy suite. Mental Status Examination: alert and                         oriented. Airway Examination: normal oropharyngeal                         airway and neck mobility. Respiratory Examination:                         clear to auscultation. CV Examination: normal.  Prophylactic Antibiotics: The patient does not require                         prophylactic antibiotics. Prior Anticoagulants: The                         patient has taken no previous anticoagulant or                          antiplatelet agents. ASA Grade Assessment: III - A                         patient with severe systemic disease. After reviewing                         the risks and benefits, the patient was deemed in                         satisfactory condition to undergo the procedure. The                         anesthesia plan was to use monitored anesthesia care                         (MAC). Immediately prior to administration of                         medications, the patient was re-assessed for adequacy                         to receive sedatives. The heart rate, respiratory                         rate, oxygen saturations, blood pressure, adequacy of                         pulmonary ventilation, and response to care were                         monitored throughout the procedure. The physical                         status of the patient was re-assessed after the                         procedure.                        After obtaining informed consent, the colonoscope was                         passed under direct vision. Throughout the procedure,                         the patient's blood pressure, pulse, and oxygen                         saturations were monitored continuously. The  Colonoscope was introduced through the anus and                         advanced to the the cecum, identified by appendiceal                         orifice and ileocecal valve. The colonoscopy was                         performed without difficulty. The patient tolerated                         the procedure well. The quality of the bowel                         preparation was evaluated using the BBPS [facility] Bowel                         Preparation Scale) with scores of: Right Colon = 3,                         Transverse Colon = 3 and Left Colon = 3 (entire mucosa                         seen well with no residual staining, small fragments                          of stool or opaque liquid). The total BBPS score                         equals 9. Findings:      The perianal and digital rectal examinations were normal. Pertinent       negatives include normal sphincter tone and no palpable rectal lesions.      A diminutive polyp was found in the cecum. The polyp was sessile. The       polyp was removed with a cold biopsy forceps. Resection and retrieval       were complete.      Multiple diverticula were found in the sigmoid colon.      Non-bleeding external hemorrhoids were found during retroflexion. The       hemorrhoids were medium-sized.      The exam was otherwise without abnormality. Impression:            - One diminutive polyp in the cecum, removed with a                         cold biopsy forceps. Resected and retrieved.                        - Diverticulosis in the sigmoid colon.                        - Non-bleeding external hemorrhoids.                        - The examination was otherwise normal. Recommendation:        - Discharge patient to home (with escort).                        -  Resume previous diet today.                        - Continue present medications.                        - Await pathology results.                        - Repeat colonoscopy in 7-10 years for surveillance. Procedure Code(s):     --- Professional ---                        [id number], Colonoscopy, flexible; with biopsy, single or                         multiple Diagnosis Code(s):     --- Professional ---                        K63.5, Polyp of colon                        K64.4, Residual hemorrhoidal skin tags                        K62.5, Hemorrhage of anus and rectum                        K57.30, Diverticulosis of large intestine without                         perforation or abscess without bleeding CPT copyright 2019 American Medical Association. All rights reserved. The codes documented in this report are preliminary and upon coder review may   be revised to meet current compliance requirements. Dr. Ulyess Mort Lin Landsman MD, MD 10/04/2019 8:34:50 AM This report has been signed electronically. Number of Addenda: 0 Note Initiated On: 10/04/2019 7:22 AM Scope Withdrawal Time: 0 hours 11 minutes 1 second  Total Procedure Duration: 0 hours 14 minutes 33 seconds  Estimated Blood Loss:  Estimated blood loss: none.      [facility]

## 2019-10-04 NOTE — Transfer of Care (Signed)
Immediate Anesthesia Transfer of Care Note  Patient: [name]  Procedure(s) Performed: COLONOSCOPY WITH BIOPSY (N/A Rectum) POLYPECTOMY (N/A Rectum)  Patient Location: PACU  Anesthesia Type: General  Level of Consciousness: awake, alert  and patient cooperative  Airway and Oxygen Therapy: Patient Spontanous Breathing and Patient connected to supplemental oxygen  Post-op Assessment: Post-op Vital signs reviewed, Patient's Cardiovascular Status Stable, Respiratory Function Stable, Patent Airway and No signs of Nausea or vomiting  Post-op Vital Signs: Reviewed and stable  Complications: No apparent anesthesia complications

## 2019-10-04 NOTE — Anesthesia Postprocedure Evaluation (Signed)
Anesthesia Post Note  Patient: [name]  Procedure(s) Performed: COLONOSCOPY WITH BIOPSY (N/A Rectum) POLYPECTOMY (N/A Rectum)     Patient location during evaluation: PACU Anesthesia Type: General Level of consciousness: awake and alert Pain management: pain level controlled Vital Signs Assessment: post-procedure vital signs reviewed and stable Respiratory status: spontaneous breathing, nonlabored ventilation, respiratory function stable and patient connected to nasal cannula oxygen Cardiovascular status: stable and blood pressure returned to baseline Postop Assessment: no apparent nausea or vomiting Anesthetic complications: no    Wanda Plump Barrie Sigmund

## 2019-10-04 NOTE — Anesthesia Preprocedure Evaluation (Addendum)
Anesthesia Evaluation  Patient identified by MRN, date of birth, ID band Patient awake    Reviewed: Allergy & Precautions, H&P , NPO status , Patient's Chart, lab work & pertinent test results  History of Anesthesia Complications (+) PROLONGED EMERGENCE and history of anesthetic complications  Airway Mallampati: III  TM Distance: <3 FB Neck ROM: limited    Dental  (+) Poor Dentition, Caps   Pulmonary neg shortness of breath, sleep apnea ,    Pulmonary exam normal breath sounds clear to auscultation       Cardiovascular Exercise Tolerance: Good hypertension, (-) angina+ CAD, + Cardiac Stents and + Peripheral Vascular Disease  (-) Past MI Normal cardiovascular exam Rhythm:regular Rate:Normal     Neuro/Psych  Headaches, negative psych ROS   GI/Hepatic negative GI ROS, Neg liver ROS,   Endo/Other  diabetes, Type 2  Renal/GU Renal disease  negative genitourinary   Musculoskeletal  (+) Arthritis ,   Abdominal   Peds  Hematology negative hematology ROS (+)   Anesthesia Other Findings    Reproductive/Obstetrics negative OB ROS                             Anesthesia Physical  Anesthesia Plan  ASA: III  Anesthesia Plan: General   Post-op Pain Management:    Induction: Intravenous  PONV Risk Score and Plan: 2 and Ondansetron and TIVA  Airway Management Planned: Natural Airway  Additional Equipment:   Intra-op Plan:   Post-operative Plan:   Informed Consent: I have reviewed the patients History and Physical, chart, labs and discussed the procedure including the risks, benefits and alternatives for the proposed anesthesia with the patient or authorized representative who has indicated his/her understanding and acceptance.       Plan Discussed with: Anesthesiologist, CRNA and Surgeon  Anesthesia Plan Comments:        Anesthesia Quick Evaluation

## 2019-10-04 NOTE — Progress Notes (Signed)
Outside

## 2019-10-04 NOTE — H&P (Signed)
Cathy Darby, MD [address]  [place]  [place], [place] [address]  Main: [phone]  Fax: [phone] Pager: [phone]  Primary Care Physician:  Perrin Maltese, MD Primary Gastroenterologist:  Dr. Cephas Leon  Pre-Procedure History & Physical: HPI:  [name] is a 74 y.o. female is here for an colonoscopy.   Past Medical History:  Diagnosis Date  . Arthritis   . Cerebral aneurysm   . Chronic kidney disease   . Complication of anesthesia    "take a long time to awaken "  . Coronary artery disease   . Diabetes mellitus   . Headache(784.0)   . Heart murmur   . HOH (hard of hearing)    Has hearing aids, doesn't wear  . Hypertension   . Neck stiffness    screws in neck.  Some limitations of movement  . Sleep apnea    does not use cpap  . Systemic lupus erythematosus ([place])   . Wears dentures    partial lower    Past Surgical History:  Procedure Laterality Date  . ANEURYSM COILING  3/15  . BACK SURGERY    . BARTHOLIN CYST MARSUPIALIZATION    . BREAST CYST ASPIRATION Right 01/25/2012   FNA neg.  Marland Kitchen BREAST EXCISIONAL BIOPSY Left 06/26/2007   neg  . CARDIAC CATHETERIZATION  08   stents  . CEREBRAL ANEURYSM REPAIR  2008   A COM  . CEREBRAL ANEURYSM REPAIR  2008   R MCA  . CERVICAL SPINE SURGERY    . EYE SURGERY     cataract  . UPPER ESOPHAGEAL ENDOSCOPIC ULTRASOUND (EUS) N/A 04/15/2016   Procedure: UPPER ESOPHAGEAL ENDOSCOPIC ULTRASOUND (EUS);  Surgeon: Holly Bodily, MD;  Location: [facility];  Service: Gastroenterology;  Laterality: N/A;    Prior to Admission medications   Medication Sig Start Date End Date Taking? Authorizing Provider  allopurinol (ZYLOPRIM) 300 MG tablet Take 300 mg by mouth daily.   Yes [provider]  aspirin 81 MG chewable tablet Chew 81 mg by mouth daily.   Yes [provider]  clopidogrel (PLAVIX) 75 MG tablet Take 75 mg by mouth daily.   Yes [provider]  cyclobenzaprine  (FLEXERIL) 5 MG tablet Take 5 mg by mouth 3 (three) times daily as needed for muscle spasms.   Yes [provider]  fexofenadine (ALLEGRA) 180 MG tablet Take 180 mg by mouth daily as needed for allergies or rhinitis.   Yes [provider]  furosemide (LASIX) 40 MG tablet Take 40 mg by mouth daily.    Yes [provider]  gabapentin (NEURONTIN) 100 MG capsule TAKE 1 CAPSULE BY MOUTH EVERYDAY AT BEDTIME 03/06/19  Yes [provider]  gabapentin (NEURONTIN) 300 MG capsule Take 300 mg by mouth 2 (two) times daily as needed.   Yes [provider]  glimepiride (AMARYL) 2 MG tablet Take 3 mg by mouth daily.    Yes [provider]  hydroxychloroquine (PLAQUENIL) 200 MG tablet TAKE 1 TABLET BY MOUTH daily 11/13/14  Yes [provider]  ibandronate (BONIVA) 150 MG tablet Take by mouth.   Yes [provider]  labetalol (NORMODYNE) 300 MG tablet Take 300 mg by mouth 2 (two) times daily. 07/24/16  Yes [provider]  mometasone (NASONEX) 50 MCG/ACT nasal spray SPRAY 2 SPRAYS EACH NOSTRIL DAILY 02/19/19  Yes [provider]  ONETOUCH VERIO test strip  02/19/19  Yes [provider]  pantoprazole (PROTONIX) 40  MG tablet Take 40 mg by mouth daily. 02/23/19  Yes [provider]  rosuvastatin (CRESTOR) 5 MG tablet Take 5 mg by mouth at bedtime. 07/24/16  Yes [provider]  valsartan (DIOVAN) 320 MG tablet Take 320 mg by mouth daily. 01/26/16  Yes [provider]  zolpidem (AMBIEN) 5 MG tablet Take 5 mg by mouth at bedtime as needed. For sleep   Yes [provider]  atenolol (TENORMIN) 100 MG tablet Take 100 mg by mouth daily.    [provider]  chlorhexidine (PERIDEX) 0.12 % solution Use as directed 15 mLs in the mouth or throat 2 (two) times daily as needed.    [provider]  Cholecalciferol (VITAMIN D3) 10000 UNITS capsule Take 10,000 Units by mouth daily.    [provider]  hydrALAZINE (APRESOLINE) 50 MG tablet Take 25 mg by mouth 2 (two) times daily.    [provider]  olmesartan (BENICAR) 40 MG tablet Take 40 mg by mouth daily.    [provider]    Allergies as of 09/20/2019 - Review Complete 09/19/2019  Allergen Reaction Noted  . Amlodipine Swelling 07/23/2011  . Sulfa antibiotics Itching 01/13/2012    Family History  Problem Relation Age of Onset  . Breast cancer Paternal Aunt 35  . Breast cancer Paternal Aunt   . Heart disease Mother   . Heart disease Father   . Stroke Father   . Diabetes Sister   . Diabetes Brother     Social History   Socioeconomic History  . Marital status: Widowed    Spouse name: Not on file  . Number of children: Not on file  . Years of education: Not on file  . Highest education level: Not on file  Occupational History  . Not on file  Tobacco Use  . Smoking status: Never Smoker  . Smokeless tobacco: Never Used  Substance and Sexual Activity  . Alcohol use: Yes    Comment: Occasionally at [place]  . Drug use: No  . Sexual activity: Not Currently    Birth control/protection: Post-menopausal  Other Topics Concern  . Not on file  Social History Narrative  . Not on file   Social Determinants of Health   Financial Resource Strain:   . Difficulty of Paying Living Expenses:   Food Insecurity:   . Worried About Charity fundraiser in the Last Year:   . Arboriculturist in the Last Year:   Transportation Needs:   . Film/video editor (Medical):   Marland Kitchen Lack of Transportation (Non-Medical):   Physical Activity:   . Days of Exercise per Week:   . Minutes of Exercise per Session:   Stress:   . Feeling of Stress :   Social Connections:   . Frequency of Communication with Friends and Family:   . Frequency of Social Gatherings with Friends and Family:   . Attends Religious Services:   . Active Member of Clubs or Organizations:   . Attends Archivist Meetings:     Marland Kitchen Marital Status:   Intimate Partner Violence:   . Fear of Current or Ex-Partner:   . Emotionally Abused:   Marland Kitchen Physically Abused:   . Sexually Abused:     Review of Systems: See HPI, otherwise negative ROS  Physical Exam: BP (!) 189/87 (BP Location: Left Arm)   Pulse 78   Temp 97.7 F (36.5 C) (Temporal)   Ht 5\' 3"  (1.6 m)   Wt  77.6 kg   SpO2 98%   BMI 30.29 kg/m  General:   Alert,  pleasant and cooperative in NAD Head:  Normocephalic and atraumatic. Neck:  Supple; no masses or thyromegaly. Lungs:  Clear throughout to auscultation.    Heart:  Regular rate and rhythm. Abdomen:  Soft, nontender and nondistended. Normal bowel sounds, without guarding, and without rebound.   Neurologic:  Alert and  oriented x4;  grossly normal neurologically.  Impression/Plan: [employer] is here for an colonoscopy to be performed for rectal bleeding  Risks, benefits, limitations, and alternatives regarding  colonoscopy have been reviewed with the patient.  Questions have been answered.  All parties agreeable.   Sherri Sear, MD  10/04/2019, 8:06 AM

## 2019-10-05 ENCOUNTER — Other Ambulatory Visit: Payer: Self-pay | Admitting: Oncology

## 2019-10-05 ENCOUNTER — Encounter: Payer: Self-pay | Admitting: *Deleted

## 2019-10-08 ENCOUNTER — Encounter: Payer: Self-pay | Admitting: Gastroenterology

## 2019-10-08 LAB — SURGICAL PATHOLOGY

## 2019-10-15 ENCOUNTER — Other Ambulatory Visit: Payer: Self-pay

## 2019-10-17 ENCOUNTER — Encounter: Payer: Self-pay | Admitting: Gastroenterology

## 2019-10-17 ENCOUNTER — Ambulatory Visit (INDEPENDENT_AMBULATORY_CARE_PROVIDER_SITE_OTHER): Payer: Medicare Other | Admitting: Gastroenterology

## 2019-10-17 ENCOUNTER — Other Ambulatory Visit: Payer: Self-pay

## 2019-10-17 VITALS — BP 154/71 | HR 75 | Temp 97.5°F | Wt 171.5 lb

## 2019-10-17 DIAGNOSIS — K625 Hemorrhage of anus and rectum: Secondary | ICD-10-CM | POA: Diagnosis not present

## 2019-10-17 DIAGNOSIS — D649 Anemia, unspecified: Secondary | ICD-10-CM

## 2019-10-17 NOTE — Progress Notes (Signed)
Cathy Darby, MD [address]  [place]  [place], [place] [address]  Main: [phone]  Fax: [phone]    Gastroenterology Consultation  Referring Provider:     Perrin Maltese, MD Primary Care Physician:  Cathy Maltese, MD Primary Gastroenterologist:  Dr. Cephas Leon Reason for Consultation:   Rectal bleeding, normocytic anemia        HPI:   [name] is a 74 y.o. female referred by Dr. Perrin Maltese, MD  for consultation & management of rectal bleeding.  Patient reports that about 2 or 3 weeks ago, she noticed a gush of bright red blood per rectum which was painless.  She did not experience episodes since then.  She denies constipation but she thinks her stool consistency has changed.  She is also trying to lose weight by following portion diet and avoiding high calorie foods.  She lost about 5 to 6 pounds.  She denies abdominal pain, bloating, nausea, melena.  She does have normocytic anemia, last hemoglobin was 11.1.  She had remote history of B12 deficiency for which she is taking over-the-counter B12 supplements daily  Follow-up visit 10/09/2019 Patient underwent colonoscopy which revealed sigmoid diverticulosis and nonbleeding external hemorrhoids.  She denies any further episodes of rectal bleeding.  She does report feeling tired.  She did not get labs for anemia during last visit.  She denies any other GI symptoms today  NSAIDs: None  Antiplts/Anticoagulants/Anti thrombotics: Aspirin and Plavix for history of coronary artery disease s/p PCI in 2008  GI Procedures: Colonoscopy approximately 8 years ago, found to have polyps  - One diminutive polyp in the cecum, removed with a cold biopsy forceps. Resected and retrieved. - Diverticulosis in the sigmoid colon. - Non-bleeding external hemorrhoids. - The examination was otherwise normal.  DIAGNOSIS:  A. COLON POLYP, CECUM; COLD BIOPSY:  - BENIGN COLONIC MUCOSA WITH INTRAMUCOSAL LYMPHOID AGGREGATES.   - NEGATIVE FOR DYSPLASIA AND MALIGNANCY.   Past Medical History:  Diagnosis Date  . Arthritis   . Cerebral aneurysm   . Chronic kidney disease   . Complication of anesthesia    "take a long time to awaken "  . Coronary artery disease   . Diabetes mellitus   . Headache(784.0)   . Heart murmur   . HOH (hard of hearing)    Has hearing aids, doesn't wear  . Hypertension   . Neck stiffness    screws in neck.  Some limitations of movement  . Sleep apnea    does not use cpap  . Systemic lupus erythematosus ([place])   . Wears dentures    partial lower    Past Surgical History:  Procedure Laterality Date  . ANEURYSM COILING  3/15  . BACK SURGERY    . BARTHOLIN CYST MARSUPIALIZATION    . BREAST CYST ASPIRATION Right 01/25/2012   FNA neg.  [facility] Kitchen BREAST EXCISIONAL BIOPSY Left 06/26/2007   neg  . CARDIAC CATHETERIZATION  08   stents  . CEREBRAL ANEURYSM REPAIR  2008   A COM  . CEREBRAL ANEURYSM REPAIR  2008   R MCA  . CERVICAL SPINE SURGERY    . COLONOSCOPY WITH PROPOFOL N/A 10/04/2019   Procedure: COLONOSCOPY WITH BIOPSY;  Surgeon: Cathy Landsman, MD;  Location: [facility];  Service: Endoscopy;  Laterality: N/A;  Diabetic (borderline) - oral meds priority 3  . EYE SURGERY     cataract  . POLYPECTOMY N/A 10/04/2019   Procedure: POLYPECTOMY;  Surgeon: Cathy Landsman, MD;  Location: [facility];  Service: Endoscopy;  Laterality: N/A;  . UPPER ESOPHAGEAL ENDOSCOPIC ULTRASOUND (EUS) N/A 04/15/2016   Procedure: UPPER ESOPHAGEAL ENDOSCOPIC ULTRASOUND (EUS);  Surgeon: Cathy Bodily, MD;  Location: [facility] ENDOSCOPY;  Service: Gastroenterology;  Laterality: N/A;    Current Outpatient Medications:  .  allopurinol (ZYLOPRIM) 300 MG tablet, Take 300 mg by mouth daily., Disp: , Rfl:  .  aspirin 81 MG chewable tablet, Chew 81 mg by mouth daily., Disp: , Rfl:  .  atenolol (TENORMIN) 100 MG tablet, Take 100 mg by mouth daily., Disp: , Rfl:  .  chlorhexidine (PERIDEX)  0.12 % solution, Use as directed 15 mLs in the mouth or throat 2 (two) times daily as needed., Disp: , Rfl:  .  Cholecalciferol (VITAMIN D3) 10000 UNITS capsule, Take 10,000 Units by mouth daily., Disp: , Rfl:  .  clopidogrel (PLAVIX) 75 MG tablet, Take 75 mg by mouth daily., Disp: , Rfl:  .  cyclobenzaprine (FLEXERIL) 5 MG tablet, Take 5 mg by mouth 3 (three) times daily as needed for muscle spasms., Disp: , Rfl:  .  fexofenadine (ALLEGRA) 180 MG tablet, Take 180 mg by mouth daily as needed for allergies or rhinitis., Disp: , Rfl:  .  furosemide (LASIX) 40 MG tablet, Take 40 mg by mouth daily. , Disp: , Rfl:  .  gabapentin (NEURONTIN) 100 MG capsule, TAKE 1 CAPSULE BY MOUTH EVERYDAY AT BEDTIME, Disp: , Rfl:  .  gabapentin (NEURONTIN) 300 MG capsule, Take 300 mg by mouth 2 (two) times daily as needed., Disp: , Rfl:  .  glimepiride (AMARYL) 2 MG tablet, Take 3 mg by mouth daily. , Disp: , Rfl:  .  hydrALAZINE (APRESOLINE) 50 MG tablet, Take 25 mg by mouth 2 (two) times daily., Disp: , Rfl:  .  hydroxychloroquine (PLAQUENIL) 200 MG tablet, TAKE 1 TABLET BY MOUTH daily, Disp: , Rfl:  .  ibandronate (BONIVA) 150 MG tablet, Take by mouth., Disp: , Rfl:  .  labetalol (NORMODYNE) 300 MG tablet, Take 300 mg by mouth 2 (two) times daily., Disp: , Rfl: 2 .  losartan (COZAAR) 100 MG tablet, Take 100 mg by mouth daily., Disp: , Rfl:  .  mometasone (NASONEX) 50 MCG/ACT nasal spray, SPRAY 2 SPRAYS EACH NOSTRIL DAILY, Disp: , Rfl:  .  olmesartan (BENICAR) 40 MG tablet, Take 40 mg by mouth daily., Disp: , Rfl:  .  ONETOUCH VERIO test strip, , Disp: , Rfl:  .  pantoprazole (PROTONIX) 40 MG tablet, Take 40 mg by mouth daily., Disp: , Rfl:  .  rosuvastatin (CRESTOR) 5 MG tablet, Take 5 mg by mouth at bedtime., Disp: , Rfl: 3 .  SYMBICORT 80-4.5 MCG/ACT inhaler, Inhale 1 puff into the lungs 2 (two) times daily., Disp: , Rfl:  .  tretinoin (RETIN-A) 0.025 % cream, APPLY TO THE AFFECTED AREA ONE A DAY, Disp: , Rfl:    .  valsartan (DIOVAN) 320 MG tablet, Take 320 mg by mouth daily., Disp: , Rfl: 3 .  zolpidem (AMBIEN) 5 MG tablet, Take 5 mg by mouth at bedtime as needed. For sleep, Disp: , Rfl:    Family History  Problem Relation Age of Onset  . Breast cancer Paternal Aunt 93  . Breast cancer Paternal Aunt   . Heart disease Mother   . Heart disease Father   . Stroke Father   . Diabetes Sister   . Diabetes Brother      Social  History   Tobacco Use  . Smoking status: Never Smoker  . Smokeless tobacco: Never Used  Substance Use Topics  . Alcohol use: Yes    Comment: Occasionally at [place]  . Drug use: No    Allergies as of 10/17/2019 - Review Complete 10/17/2019  Allergen Reaction Noted  . Amlodipine Swelling 07/23/2011  . Sulfa antibiotics Itching 01/13/2012    Review of Systems:    All systems reviewed and negative except where noted in HPI.   Physical Exam:  BP (!) 154/71 (BP Location: Left Arm, Patient Position: Sitting, Cuff Size: Large)   Pulse 75   Temp (!) 97.5 F (36.4 C) (Oral)   Wt 171 lb 8 oz (77.8 kg)   BMI 30.38 kg/m  No LMP recorded. Patient is postmenopausal.  General:   Alert,  Well-developed, well-nourished, pleasant and cooperative in NAD Head:  Normocephalic and atraumatic. Eyes:  Sclera clear, no icterus.   Conjunctiva pink. Ears:  Normal auditory acuity. Nose:  No deformity, discharge, or lesions. Mouth:  No deformity or lesions,oropharynx pink & moist. Neck:  Supple; no masses or thyromegaly. Lungs:  Respirations even and unlabored.  Clear throughout to auscultation.   No wheezes, crackles, or rhonchi. No acute distress. Heart:  Regular rate and rhythm; no murmurs, clicks, rubs, or gallops. Abdomen:  Normal bowel sounds. Soft, obese, non-tender and non-distended without masses, hepatosplenomegaly or hernias noted.  No guarding or rebound tenderness.   Rectal: Not performed Msk:  Symmetrical without gross deformities. Good, equal movement & strength  bilaterally. Pulses:  Normal pulses noted. Extremities:  No clubbing or edema.  No cyanosis. Neurologic:  Alert and oriented x3;  grossly normal neurologically. Skin:  Intact without significant lesions or rashes. No jaundice. Psych:  Alert and cooperative. Normal mood and affect.  Imaging Studies: Reviewed  Assessment and Plan:   NIRVI BOEHLER is a 74 y.o. female with metabolic syndrome, diabetes, coronary artery disease s/p PCI on aspirin and Plavix is seen for rectal bleeding and normocytic anemia  Rectal bleeding, painless, it was a self-limited episode Differentials include either diverticular bleed or bleeding from internal hemorrhoids Colonoscopy revealed diverticulosis and hemorrhoids only Defer hemorrhoid ligation at this time  Normocytic anemia Check iron panel, B12 and folate panel Further work-up based on the above results   Follow up in 3 months   Cathy Darby, MD

## 2019-10-18 ENCOUNTER — Telehealth: Payer: Self-pay | Admitting: *Deleted

## 2019-10-18 ENCOUNTER — Other Ambulatory Visit: Payer: Medicare Other

## 2019-10-18 LAB — IRON,TIBC AND FERRITIN PANEL
Ferritin: 112 ng/mL (ref 15–150)
Iron Saturation: 23 % (ref 15–55)
Iron: 75 ug/dL (ref 27–139)
Total Iron Binding Capacity: 325 ug/dL (ref 250–450)
UIBC: 250 ug/dL (ref 118–369)

## 2019-10-18 LAB — B12 AND FOLATE PANEL
Folate: 12.9 ng/mL (ref >3.0)
Vitamin B-12: 908 pg/mL (ref 232–1245)

## 2019-10-18 NOTE — Telephone Encounter (Signed)
Message left with patient regarding recommendations from conference today in regards to lung nodule seen on imaging. Pt informed that recent images were compared with CT scan from 2008 which helped determine that the lung nodule has been unchanged since 2008. No further follow up is recommended at this time. Pt instructed to call back if has any further questions or needs.

## 2019-10-18 NOTE — Progress Notes (Signed)
Tumor Board Documentation  [name] was presented by Dr Janese Banks at our Tumor Board on 10/18/2019, which included representatives from medical oncology, radiation oncology, navigation, pathology, radiology, surgical oncology, internal medicine, genetics, pulmonology, palliative care, research.  [name] currently presents as a new patient, for discussion with history of the following treatments: active survellience.  Additionally, we reviewed previous medical and familial history, history of present illness, and recent lab results along with all available histopathologic and imaging studies. The tumor board considered available treatment options and made the following recommendations:   No Oncology Follow up, Refer to [facility]  The following procedures/referrals were also placed: No orders of the defined types were placed in this encounter.   Clinical Trial Status: not discussed   Staging used: Not Applicable  National site-specific guidelines   were discussed with respect to the case.  Tumor board is a meeting of clinicians from various specialty areas who evaluate and discuss patients for whom a multidisciplinary approach is being considered. Final determinations in the plan of care are those of the provider(s). The responsibility for follow up of recommendations given during tumor board is that of the provider.   Today's extended care, comprehensive team conference, Marty was not present for the discussion and was not examined.   Multidisciplinary Tumor Board is a multidisciplinary case peer review process.  Decisions discussed in the Multidisciplinary Tumor Board reflect the opinions of the specialists present at the conference without having examined the patient.  Ultimately, treatment and diagnostic decisions rest with the primary provider(s) and the patient.

## 2020-03-05 ENCOUNTER — Ambulatory Visit: Payer: Medicare Other | Admitting: Gastroenterology

## 2020-06-05 ENCOUNTER — Other Ambulatory Visit: Payer: Self-pay | Admitting: Internal Medicine

## 2020-06-05 DIAGNOSIS — Z1231 Encounter for screening mammogram for malignant neoplasm of breast: Secondary | ICD-10-CM

## 2020-06-06 ENCOUNTER — Other Ambulatory Visit: Payer: Medicare Other

## 2020-06-06 DIAGNOSIS — Z20822 Contact with and (suspected) exposure to covid-19: Secondary | ICD-10-CM

## 2020-06-07 LAB — NOVEL CORONAVIRUS, NAA: SARS-CoV-2, NAA: NOT DETECTED

## 2020-06-07 LAB — SARS-COV-2, NAA 2 DAY TAT

## 2020-06-21 HISTORY — PX: BUNIONECTOMY: SHX129

## 2020-07-22 ENCOUNTER — Ambulatory Visit: Payer: Medicare Other | Attending: Internal Medicine | Admitting: Physical Therapy

## 2020-07-22 ENCOUNTER — Other Ambulatory Visit: Payer: Self-pay

## 2020-07-22 ENCOUNTER — Encounter: Payer: Self-pay | Admitting: Physical Therapy

## 2020-07-22 DIAGNOSIS — M25551 Pain in right hip: Secondary | ICD-10-CM

## 2020-07-22 DIAGNOSIS — M6281 Muscle weakness (generalized): Secondary | ICD-10-CM | POA: Diagnosis present

## 2020-07-22 DIAGNOSIS — M545 Low back pain, unspecified: Secondary | ICD-10-CM | POA: Insufficient documentation

## 2020-07-22 DIAGNOSIS — G8929 Other chronic pain: Secondary | ICD-10-CM | POA: Diagnosis present

## 2020-07-22 DIAGNOSIS — R262 Difficulty in walking, not elsewhere classified: Secondary | ICD-10-CM | POA: Insufficient documentation

## 2020-07-22 DIAGNOSIS — M25511 Pain in right shoulder: Secondary | ICD-10-CM | POA: Insufficient documentation

## 2020-07-22 NOTE — Therapy (Signed)
[facility] [address] [place], [address] Phone: [phone]   Fax:  [phone]  Physical Therapy Evaluation  Patient Details  Name: [name] MRN: [id number] Date of Birth: [birth year] Referring Provider (PT): Lamonte Sakai, MD   Encounter Date: 07/22/2020   PT End of Session - 07/22/20 1327    Visit Number 1    Number of Visits 24    Date for PT Re-Evaluation 10/14/20    Authorization Type [employer] MEDICARE reporting period from 07/22/2020    Progress Note Due on Visit 10    PT Start Time 1030    PT Stop Time 1115    PT Time Calculation (min) 45 min    Activity Tolerance Patient limited by pain;Patient limited by fatigue    Behavior During Therapy [facility] for tasks assessed/performed           Past Medical History:  Diagnosis Date  . Arthritis   . Cerebral aneurysm   . Chronic kidney disease   . Complication of anesthesia    "take a long time to awaken "  . Coronary artery disease   . Diabetes mellitus   . Headache(784.0)   . Heart murmur   . HOH (hard of hearing)    Has hearing aids, doesn't wear  . Hypertension   . Neck stiffness    screws in neck.  Some limitations of movement  . Sleep apnea    does not use cpap  . Systemic lupus erythematosus ([place])   . Wears dentures    partial lower    Past Surgical History:  Procedure Laterality Date  . ANEURYSM COILING  3/15  . BACK SURGERY    . BARTHOLIN CYST MARSUPIALIZATION    . BREAST CYST ASPIRATION Right 01/25/2012   FNA neg.  Marland Kitchen BREAST EXCISIONAL BIOPSY Left 06/26/2007   neg  . CARDIAC CATHETERIZATION  08   stents  . CEREBRAL ANEURYSM REPAIR  2008   A COM  . CEREBRAL ANEURYSM REPAIR  2008   R MCA  . CERVICAL SPINE SURGERY    . COLONOSCOPY WITH PROPOFOL N/A 10/04/2019   Procedure: COLONOSCOPY WITH BIOPSY;  Surgeon: Lin Landsman, MD;  Location: [facility];  Service: Endoscopy;  Laterality: N/A;  Diabetic (borderline) -  oral meds priority 3  . EYE SURGERY     cataract  . POLYPECTOMY N/A 10/04/2019   Procedure: POLYPECTOMY;  Surgeon: Lin Landsman, MD;  Location: [facility];  Service: Endoscopy;  Laterality: N/A;  . UPPER ESOPHAGEAL ENDOSCOPIC ULTRASOUND (EUS) N/A 04/15/2016   Procedure: UPPER ESOPHAGEAL ENDOSCOPIC ULTRASOUND (EUS);  Surgeon: Holly Bodily, MD;  Location: [facility] ENDOSCOPY;  Service: Gastroenterology;  Laterality: N/A;    There were no vitals filed for this visit.    Subjective Assessment - 07/22/20 1321    Subjective Patient reports she asked her doctor about extra soft tissue under her upper arms and her doctor said she "needs muscles" and referred her to physical therapy. Her right shoulder is currently hurting and her right hip is also bothering her. She says the right hip bothers her the most but the R shoulder is the most worrisome to her. She reports her right shoulder has hurt for about 2 years and it has not gotten better. She does not want to have surgery. She fell and it was dislocated. She tried to reduce it herself so it stayed dislocated for 2 days. The last therapist thought it  would never work again but they worked together and not she can use it. It is an annoying pain after all these years. It has not dislocated again. She does have some fear when she walks by that parking lot. It was at [facility] parking lot. She tripped over the stopper in the parking space. Agg: reaching up and sideways. Does not avoid things because of it. Goal: no pain. She gave up on it a year ago. Sometimes has numbness/tingling and some pain over the lateral arm that is new. Overall is staying the same. Gets injections that help for a couple of months. Ease: injections. Rates current pain: 3/10. W: 4-5/10; B: 3/10. States she has some limited motion, does wake her at night.  If she lifts something too heavy it affects her at night and for a week or so. Lifting groceries that are too heavy such as a  gallon of milk and something else. She gets the pain in the posterior R shoulder. Left shoulder does not bother her. Does have neck pain.   Reports she is unsure when her R hip started hurting but in the last two weeks it has been bothering her. It has probably been there for months. Had injection last year. It started at the right side near the low back and now is traveling to the left side. She has had pain like this off and on but it is now more consistent. She has a history of back surgery (fusion). It hurts the most in the morning when she gets up. Agg: getting up in the morning. States it only bothers her when she walk. Sitting and lying down doesn't bother her. Going from sit to stand bothers her. Stairs do not really bother her. She feels slow in her walking. Injection on the hip but it only worked for a week. Medications for arthritis and everything else. Pain also goes down right posterior thigh and both legs were numb completely. It lasted for the whole week while she was sick with pneumonia. She got sick in [place]. She was sick in bed for 2 weeks and is currently supposed to be on bed rest for 2 weeks. That was 2 days ago. Currently: 2-3/10; W: 10/10; B: 2-3/10. No accident that started the pain. Prolonged standing, cooking   Pertinent History Patient is a 75 y.o. female who presents to outpatient physical therapy with a referral for medical diagnosis bilateral shoulder pain and right hip pain. This patient's chief complaints consist of chronic  right hip pain (pointing to right flank) and right shoulder pain in context of recent illness and prior episodes of low back and neck pain leading to the following functional deficits: difficulty with usual activities including lifting, reaching, walking, getting out of bed, standing from sitting, prolonged standing, and cooking. Relevant past medical history and comorbidities include Hx of cerebral anneurism repair (A COM and R MCA in 2008, coiling 2015),  cardiac cath with stents in 2008, chronic inflammatory arthritis now seronegative, cervical spine surgery (fusion), back surgery (fusion), systemic lups erythematosus, CKD (one kidney), DMII, heart murmur, HTN, sleep apnea, cateract surgery, HOH, headaches (mornings). Patient denies hx of cancer, stroke, seizures, lung problem, unexplained weight loss, changes in bowel or bladder problems. States she has been stumbling more.    Limitations Standing;Lifting;House hold activities;Walking    How long can you walk comfortably? < 100 feet    Patient Stated Goals Goal: no pain.    Currently in Pain? Yes    Pain  Score 3     Pain Onset More than a month ago    Effect of Pain on Daily Activities difficulty with usual activities including lifting, reaching, walking, getting out of bed, standing from sitting, prolonged standing, and cooking.             [facility] PT Assessment - 07/22/20 0001      Assessment   Medical Diagnosis bilateral shoulder pain and right hip pain    Referring Provider (PT) Lamonte Sakai, MD      Precautions   Precautions None      Balance Screen   Has the patient fallen in the past 6 months No    Has the patient had a decrease in activity level because of a fear of falling?  No    Is the patient reluctant to leave their home because of a fear of falling?  No      Home Environment   Living Environment --   no concerns about navigating living environment, except afraid to fall in the tub     Prior Function   Level of Independence Independent    Leisure dancing, volley ball, cooking, gardening,walking with granddaughter           OBJECTIVE  OBSERVATION/INSPECTION . Posture: sloping shoulders, slumped posture . Tremor: none . Muscle bulk: no obvious assymetry . Bed mobility: supine <> sit and rolling WFL but painful . Transfers: sit <> stand slow/painful, uses hand on knees occasionally, backs of knees on plinths occasionally.  . Gait: pain that makes her want to stop  with <200 feet of walking. Reports pain in both hips. Poor foot clearance, lacks hip flexion, slow.   NEUROLOGICAL Dermatomes . L4 and S2 appears equal and intact to light touch.L3, L5, and S1 with decreased sensation on R compared to left.  Myotomes . L3-S2 appears intact except possibly R L4-S1 is weak and cannot hold 5 seconds.   SPINE MOTION Lumbar AROM *Indicates pain - Flexion: = fingers to distal tibias, concordant pain (used to be able to touch toes).  - Extension: = 50% mild pain - Rotation: R = 50% left thoracic pain, L = 75 concordant pain - Side Flexion: R = fingers proximal to patella, concordant pain!, L = fingers below patella, no pain  PERIPHERAL JOINT MOTION (in degrees) Passive Range of Motion (PROM) R UE ~ 25% limited in overhead movements.  B LE WFL for basic mobility but generally painful with motion in both hips.   MUSCLE PERFORMANCE (MMT):  *Indicates pain 07/22/20 Date Date  Joint/Motion R/L R/L R/L  Hip     Flexion 4*/3 / /  Extension (knee ext) 2/2 / /  Abduction 3/2+ / /  Knee     Extension 5/5 / /  Flexion 4/4+ / /  Ankle/Foot     Dorsiflexion  4/4 / /  Great toe extension 2/4 / /  Eversion 1/3 / /  Comments:  R DF unable to hold for 5 seconds, seems to have difficulty following cuing with R ankle.   SPECIAL TESTS: Straight leg raise (SLR): B positive for calf discomfort, no concordant pain. Difficulty reporting symptoms FABER: B = postitive for back pain  ACCESSORY MOTION:  - Tender over CPA to sacrum  PALPATION: - TTP at bilateral quadratus lumborum  FUNCTIONAL/BALANCE TESTS:  Five Time Sit to Stand (5TSTS): 34 seconds from 18.5 inch plinth, intermittent posterior knee contact. Knees feel weak, mid back pain.   Ten meter walking trial (10MWT): 0.75  meters/second with no AD (reports pain)  EDUCATION/COGNITION: Patient is alert and oriented X 4.   Objective measurements completed on examination: See above findings.   TREATMENT:   Therapeutic exercise: to centralize symptoms and improve ROM, strength, muscular endurance, and activity tolerance required for successful completion of functional activities.  - hooklying LTR x 15 each way - Education on diagnosis, prognosis, POC, anatomy and physiology of current condition.  - Education on HEP including handout    HOME EXERCISE PROGRAM  Access Code: [email or web address] URL: [email or web address] Date: 07/22/2020 Prepared by: Rosita Kea  Exercises Supine Lower Trunk Rotation - 2 x daily - 20 reps    PT Education - 07/22/20 1327    Education Details Exercise purpose/form. Self management techniques. Education on diagnosis, prognosis, POC, anatomy and physiology of current condition Education on HEP including handout    Person(s) Educated Patient    Methods Explanation;Demonstration;Tactile cues;Verbal cues;Handout    Comprehension Verbalized understanding;Returned demonstration;Verbal cues required;Tactile cues required;Need further instruction            PT Short Term Goals - 07/22/20 1328      PT SHORT TERM GOAL #1   Title Be independent with initial home exercise program for self-management of symptoms.    Baseline initial HEP provided at IE (07/22/2020);      PT SHORT TERM GOAL #2   Title --    Baseline --             PT Long Term Goals - 07/22/20 1332      PT LONG TERM GOAL #1   Title Be independent with a long-term home exercise program for self-management of symptoms.    Baseline initial HEP provided at IE (07/22/2020);    Time 12    Period Weeks    Status New   TARGET DATE FOR ALL LONG TERM GOALS: 10/14/2020     PT LONG TERM GOAL #2   Title Patient will demonstrate 5 Time Sit to Stand in equal or less than 15 seconds with no UE support from 18.5 inch plinth to demonstrate improved functional strength for transfers, balance, mobility.    Baseline 34 seconds from 18.5 inch plinth, intermittent posterior knee contact. Knees feel weak, mid back  pain (07/22/2020);    Time 12    Period Weeks    Status New      PT LONG TERM GOAL #3   Title Patient will demonstrate walking speed of equal or greater than 1.2 m/s with no AD to improve community mobility.    Baseline 0.75 meters/second with no AD (reports pain) (07/22/2020);    Time 12    Period Weeks    Status New      PT LONG TERM GOAL #4   Title Demonstrate improved FOTO score by 10 units to demonstrate improvement in overall condition and self-reported functional ability.    Baseline to be measured visit 2 as appropriate (07/22/2020);    Time 12    Period Weeks    Status New      PT LONG TERM GOAL #5   Title Reduce pain with functional activities to equal or less than 3/10 to allow patient to complete usual activities including ADLs, IADLs, and social engagement with less difficulty.    Baseline 10/10 (07/22/2020);    Time 12    Period Weeks    Status New      Additional Long Term Goals   Additional Long Term Goals Yes      PT LONG  TERM GOAL #6   Title Complete community, work and/or recreational activities without limitation due to current condition.    Baseline difficulty with usual activities including lifting, reaching, walking, getting out of bed, standing from sitting, prolonged standing, and cooking (07/22/2020);    Time 12    Period Weeks    Status New                  Plan - 07/22/20 1840    Clinical Impression Statement Patient is a 75 y.o. female referred to outpatient physical therapy with a medical diagnosis of bilateral shoulder pain and right hip pain who presents with signs and symptoms consistent with chronic right shoulder pain, chronic low back pain that appears to be radiating to the R hip in the setting of chronic inflammatory arthritis, chronic deconditioning, and difficulty with usual mobility. Patient is painful with most testing and mobility this session and fatigues quickly. Her 5 Time Sit To Stand took 34 seconds with difficulty and walking speed  is 0.75 m/s well below that needed for safe street crossing. Patient presents with significant pain, joint stiffness, muscle performance (strength/power/endurance), motor control, balance, gait, ROM, and activity tolerance impairments that are limiting ability to complete her usual activities including lifting, reaching, walking, getting out of bed, standing from sitting, prolonged standing, and cooking without difficulty. Patient will benefit from skilled physical therapy intervention to address current body structure impairments and activity limitations to improve function and work towards goals set in current POC in order to return to prior level of function or maximal functional improvement.    Personal Factors and Comorbidities Age;Comorbidity 3+;Fitness;Past/Current Experience    Comorbidities Relevant past medical history and comorbidities include Hx of cerebral anneurism repair (A COM and R MCA in 2008, coiling 2015), cardiac cath with stents in 2008, chronic inflammatory arthritis now seronegative, cervical spine surgery (fusion), back surgery (fusion), systemic lups erythematosus, CKD (one kidney), DMII, heart murmur, HTN, sleep apnea, cateract surgery, HOH, headaches (mornings)    Examination-Activity Limitations Lift;Stand;Bed Mobility;Locomotion Level;Bend;Carry;Squat;Stairs;Reach Overhead    Examination-Participation Restrictions Cleaning;Community Activity;Interpersonal Relationship;Laundry;Shop;Meal Prep   difficulty with usual activities including lifting, reaching, walking, getting out of bed, standing from sitting, prolonged standing, and cooking.   Stability/Clinical Decision Making Evolving/Moderate complexity    Clinical Decision Making Moderate    Rehab Potential Fair    PT Frequency 2x / week    PT Duration 12 weeks    PT Treatment/Interventions ADLs/Self Care Home Management;Aquatic Therapy;Cryotherapy;Moist Heat;Therapeutic activities;Therapeutic exercise;Balance  training;Neuromuscular re-education;Patient/family education;Manual techniques;Passive range of motion;Dry needling;Spinal Manipulations;Joint Manipulations    PT Next Visit Plan generalized funcitonal strengthening, balance, as tolerated    PT Home Exercise Plan medbridge  Access Code: [email or web address]    Consulted and Agree with Plan of Care Patient           Patient will benefit from skilled therapeutic intervention in order to improve the following deficits and impairments:  Abnormal gait,Impaired sensation,Improper body mechanics,Pain,Decreased coordination,Decreased mobility,Increased muscle spasms,Postural dysfunction,Decreased activity tolerance,Decreased endurance,Decreased range of motion,Decreased strength,Hypomobility,Impaired perceived functional ability,Impaired UE functional use,Decreased balance,Difficulty walking,Impaired flexibility,Obesity  Visit Diagnosis: Chronic bilateral low back pain, unspecified whether sciatica present  Pain in right hip  Chronic right shoulder pain  Muscle weakness (generalized)  Difficulty in walking, not elsewhere classified     Problem List Patient Active Problem List   Diagnosis Date Noted  . Rectal bleeding   . Systemic lupus erythematosus ([place]) 09/19/2019  . Coronary disease 11/03/2017  . Diabetes ([place]) 11/03/2017  .  Gout 11/03/2017  . Osteoarthritis 11/03/2017    Everlean Alstrom. Graylon Good, PT, DPT 07/22/20, 6:45 PM  [facility] [address] [place], [address] Phone: [phone]   Fax:  [phone]  Name: [name] MRN: [id number] Date of Birth: 10/03/1945

## 2020-07-28 ENCOUNTER — Ambulatory Visit: Payer: Medicare Other | Admitting: Physical Therapy

## 2020-07-28 ENCOUNTER — Telehealth: Payer: Self-pay | Admitting: Physical Therapy

## 2020-07-28 NOTE — Telephone Encounter (Signed)
Called patient when she did not show up for her appointment today scheduled at 9am. Patient answered and said she had canceled this appointment. Confirmed next appointment Jul 31, 2020 at 6:30p.   Asked admin staff to cancel this morning's appointment.   Everlean Alstrom. Graylon Good, PT, DPT 07/28/20, 9:38 AM

## 2020-07-30 ENCOUNTER — Other Ambulatory Visit: Payer: Self-pay | Admitting: Nephrology

## 2020-07-30 ENCOUNTER — Encounter: Payer: Medicare Other | Admitting: Physical Therapy

## 2020-07-30 DIAGNOSIS — N184 Chronic kidney disease, stage 4 (severe): Secondary | ICD-10-CM

## 2020-07-31 ENCOUNTER — Ambulatory Visit: Payer: Medicare Other | Admitting: Physical Therapy

## 2020-08-01 ENCOUNTER — Other Ambulatory Visit: Payer: Self-pay | Admitting: Pulmonary Disease

## 2020-08-01 DIAGNOSIS — R911 Solitary pulmonary nodule: Secondary | ICD-10-CM

## 2020-08-04 ENCOUNTER — Telehealth: Payer: Self-pay | Admitting: Physical Therapy

## 2020-08-04 ENCOUNTER — Ambulatory Visit: Payer: Medicare Other | Admitting: Physical Therapy

## 2020-08-04 NOTE — Telephone Encounter (Signed)
Called patient when she did not show up for her 10:30am appointment today or her 6:30pm appointment Thursday 07/31/2020. Went straight to a generic VM. Requested patient call back and that it was important she call back as soon as possible. Plan to cancel all future appointments due to chronic no-shows if patient does not make contact.   [name]. Graylon Good, PT, DPT 08/04/20, 10:50 AM

## 2020-08-06 ENCOUNTER — Ambulatory Visit
Admission: RE | Admit: 2020-08-06 | Discharge: 2020-08-06 | Disposition: A | Discharge status: Home / Self Care | Payer: Medicare Other | Source: Ambulatory Visit | Attending: Nephrology | Admitting: Nephrology

## 2020-08-06 ENCOUNTER — Ambulatory Visit: Payer: Medicare Other | Admitting: Physical Therapy

## 2020-08-06 ENCOUNTER — Other Ambulatory Visit: Payer: Self-pay

## 2020-08-06 DIAGNOSIS — N184 Chronic kidney disease, stage 4 (severe): Secondary | ICD-10-CM | POA: Diagnosis present

## 2020-08-11 ENCOUNTER — Encounter: Payer: Medicare Other | Admitting: Physical Therapy

## 2020-08-12 ENCOUNTER — Other Ambulatory Visit: Payer: Self-pay | Admitting: Internal Medicine

## 2020-08-12 DIAGNOSIS — Z1231 Encounter for screening mammogram for malignant neoplasm of breast: Secondary | ICD-10-CM

## 2020-08-13 ENCOUNTER — Encounter: Payer: Medicare Other | Admitting: Physical Therapy

## 2020-08-26 ENCOUNTER — Encounter: Payer: Self-pay | Admitting: Physical Therapy

## 2020-08-26 DIAGNOSIS — M6281 Muscle weakness (generalized): Secondary | ICD-10-CM

## 2020-08-26 DIAGNOSIS — G8929 Other chronic pain: Secondary | ICD-10-CM

## 2020-08-26 DIAGNOSIS — R262 Difficulty in walking, not elsewhere classified: Secondary | ICD-10-CM

## 2020-08-26 DIAGNOSIS — M25551 Pain in right hip: Secondary | ICD-10-CM

## 2020-08-26 DIAGNOSIS — M545 Low back pain, unspecified: Secondary | ICD-10-CM

## 2020-08-26 NOTE — Therapy (Signed)
[facility] [address], [place], [address] Phone: [phone]   Fax:  [phone]  Physical Therapy No-Visit Discharge Summary Dates of reporting: 07/22/2020 - 08/26/2020  Patient Details  Name: [name] MRN: [id number] Date of Birth: 1945-08-05 Referring Provider (PT): Lamonte Sakai, MD   Encounter Date: 08/26/2020    Past Medical History:  Diagnosis Date  . Arthritis   . Cerebral aneurysm   . Chronic kidney disease   . Complication of anesthesia    "take a long time to awaken "  . Coronary artery disease   . Diabetes mellitus   . Headache(784.0)   . Heart murmur   . HOH (hard of hearing)    Has hearing aids, doesn't wear  . Hypertension   . Neck stiffness    screws in neck.  Some limitations of movement  . Sleep apnea    does not use cpap  . Systemic lupus erythematosus ([place])   . Wears dentures    partial lower    Past Surgical History:  Procedure Laterality Date  . ANEURYSM COILING  3/15  . BACK SURGERY    . BARTHOLIN CYST MARSUPIALIZATION    . BREAST CYST ASPIRATION Right 01/25/2012   FNA neg.  Marland Kitchen BREAST EXCISIONAL BIOPSY Left 06/26/2007   neg  . CARDIAC CATHETERIZATION  08   stents  . CEREBRAL ANEURYSM REPAIR  2008   A COM  . CEREBRAL ANEURYSM REPAIR  2008   R MCA  . CERVICAL SPINE SURGERY    . COLONOSCOPY WITH PROPOFOL N/A 10/04/2019   Procedure: COLONOSCOPY WITH BIOPSY;  Surgeon: Lin Landsman, MD;  Location: [facility];  Service: Endoscopy;  Laterality: N/A;  Diabetic (borderline) - oral meds priority 3  . EYE SURGERY     cataract  . POLYPECTOMY N/A 10/04/2019   Procedure: POLYPECTOMY;  Surgeon: Lin Landsman, MD;  Location: [facility];  Service: Endoscopy;  Laterality: N/A;  . UPPER ESOPHAGEAL ENDOSCOPIC ULTRASOUND (EUS) N/A 04/15/2016   Procedure: UPPER ESOPHAGEAL ENDOSCOPIC ULTRASOUND (EUS);  Surgeon: Holly Bodily, MD;  Location: [facility]  ENDOSCOPY;  Service: Gastroenterology;  Laterality: N/A;    There were no vitals filed for this visit.   Subjective Assessment - 08/26/20 1154    Subjective Patient did not respond to multiple attempts to contact her following multiple no-show appointment.    Pertinent History Patient is a 75 y.o. female who presents to outpatient physical therapy with a referral for medical diagnosis bilateral shoulder pain and right hip pain. This patient's chief complaints consist of chronic  right hip pain (pointing to right flank) and right shoulder pain in context of recent illness and prior episodes of low back and neck pain leading to the following functional deficits: difficulty with usual activities including lifting, reaching, walking, getting out of bed, standing from sitting, prolonged standing, and cooking. Relevant past medical history and comorbidities include Hx of cerebral anneurism repair (A COM and R MCA in 2008, coiling 2015), cardiac cath with stents in 2008, chronic inflammatory arthritis now seronegative, cervical spine surgery (fusion), back surgery (fusion), systemic lups erythematosus, CKD (one kidney), DMII, heart murmur, HTN, sleep apnea, cateract surgery, HOH, headaches (mornings). Patient denies hx of cancer, stroke, seizures, lung problem, unexplained weight loss, changes in bowel or bladder problems. States she has been stumbling more.    Limitations Standing;Lifting;House hold activities;Walking    How long can you walk comfortably? < 100 feet  Patient Stated Goals Goal: no pain.    Pain Onset More than a month ago           OBJECTIVE Patient is not present for examination at this time. Please see previous documentation for latest objective data.      PT Short Term Goals - 08/26/20 1157      PT SHORT TERM GOAL #1   Title Be independent with initial home exercise program for self-management of symptoms.    Baseline initial HEP provided at IE (07/22/2020);    Status Partially  Met             PT Long Term Goals - 08/26/20 1157      PT LONG TERM GOAL #1   Title Be independent with a long-term home exercise program for self-management of symptoms.    Baseline initial HEP provided at IE (07/22/2020);    Time 12    Period Weeks    Status Not Met   TARGET DATE FOR ALL LONG TERM GOALS: 10/14/2020     PT LONG TERM GOAL #2   Title Patient will demonstrate 5 Time Sit to Stand in equal or less than 15 seconds with no UE support from 18.5 inch plinth to demonstrate improved functional strength for transfers, balance, mobility.    Baseline 34 seconds from 18.5 inch plinth, intermittent posterior knee contact. Knees feel weak, mid back pain (07/22/2020);    Time 12    Period Weeks    Status Not Met      PT LONG TERM GOAL #3   Title Patient will demonstrate walking speed of equal or greater than 1.2 m/s with no AD to improve community mobility.    Baseline 0.75 meters/second with no AD (reports pain) (07/22/2020);    Time 12    Period Weeks    Status Not Met      PT LONG TERM GOAL #4   Title Demonstrate improved FOTO score by 10 units to demonstrate improvement in overall condition and self-reported functional ability.    Baseline to be measured visit 2 as appropriate (07/22/2020);    Time 12    Period Weeks    Status Not Met      PT LONG TERM GOAL #5   Title Reduce pain with functional activities to equal or less than 3/10 to allow patient to complete usual activities including ADLs, IADLs, and social engagement with less difficulty.    Baseline 10/10 (07/22/2020);    Time 12    Period Weeks    Status Not Met      PT LONG TERM GOAL #6   Title Complete community, work and/or recreational activities without limitation due to current condition.    Baseline difficulty with usual activities including lifting, reaching, walking, getting out of bed, standing from sitting, prolonged standing, and cooking (07/22/2020);    Time 12    Period Weeks    Status Not Met                 Plan - 08/26/20 1156    Clinical Impression Statement Patient attended initial eval only and then did not return for follow ups. Patient is now discharged from PT due to lack of participation. She was not able to continue working towards stated goals due to lack of participation.    Personal Factors and Comorbidities Age;Comorbidity 3+;Fitness;Past/Current Experience    Comorbidities Relevant past medical history and comorbidities include Hx of cerebral anneurism repair (A COM and R MCA in  2008, coiling 2015), cardiac cath with stents in 2008, chronic inflammatory arthritis now seronegative, cervical spine surgery (fusion), back surgery (fusion), systemic lups erythematosus, CKD (one kidney), DMII, heart murmur, HTN, sleep apnea, cateract surgery, HOH, headaches (mornings)    Examination-Activity Limitations Lift;Stand;Bed Mobility;Locomotion Level;Bend;Carry;Squat;Stairs;Reach Overhead    Examination-Participation Restrictions Cleaning;Community Activity;Interpersonal Relationship;Laundry;Shop;Meal Prep   difficulty with usual activities including lifting, reaching, walking, getting out of bed, standing from sitting, prolonged standing, and cooking.   Stability/Clinical Decision Making Evolving/Moderate complexity    Rehab Potential Fair    PT Frequency 2x / week    PT Duration 12 weeks    PT Treatment/Interventions ADLs/Self Care Home Management;Aquatic Therapy;Cryotherapy;Moist Heat;Therapeutic activities;Therapeutic exercise;Balance training;Neuromuscular re-education;Patient/family education;Manual techniques;Passive range of motion;Dry needling;Spinal Manipulations;Joint Manipulations    PT Next Visit Plan Patient is now discharged from PT    PT Ruch  Access Code: [email or web address]    Consulted and Agree with Plan of Care Patient           Patient will benefit from skilled therapeutic intervention in order to improve the following deficits and impairments:   Abnormal gait,Impaired sensation,Improper body mechanics,Pain,Decreased coordination,Decreased mobility,Increased muscle spasms,Postural dysfunction,Decreased activity tolerance,Decreased endurance,Decreased range of motion,Decreased strength,Hypomobility,Impaired perceived functional ability,Impaired UE functional use,Decreased balance,Difficulty walking,Impaired flexibility,Obesity  Visit Diagnosis: Chronic bilateral low back pain, unspecified whether sciatica present  Pain in right hip  Chronic right shoulder pain  Muscle weakness (generalized)  Difficulty in walking, not elsewhere classified     Problem List Patient Active Problem List   Diagnosis Date Noted  . Rectal bleeding   . Systemic lupus erythematosus ([place]) 09/19/2019  . Coronary disease 11/03/2017  . Diabetes ([place]) 11/03/2017  . Gout 11/03/2017  . Osteoarthritis 11/03/2017   Everlean Alstrom. Graylon Good, PT, DPT 08/26/20, 11:57 AM  [facility] [address] [place], [address] Phone: [phone]   Fax:  [phone]  Name: MERTICE UFFELMAN MRN: [id number] Date of Birth: 12-16-1945

## 2020-08-27 ENCOUNTER — Ambulatory Visit: Admission: RE | Admit: 2020-08-27 | Payer: Medicare Other | Source: Ambulatory Visit

## 2020-09-04 ENCOUNTER — Ambulatory Visit
Admission: RE | Admit: 2020-09-04 | Discharge: 2020-09-04 | Disposition: A | Discharge status: Home / Self Care | Payer: Medicare Other | Source: Ambulatory Visit | Attending: Internal Medicine | Admitting: Internal Medicine

## 2020-09-04 ENCOUNTER — Other Ambulatory Visit: Payer: Self-pay

## 2020-09-04 DIAGNOSIS — Z1231 Encounter for screening mammogram for malignant neoplasm of breast: Secondary | ICD-10-CM | POA: Insufficient documentation

## 2020-09-10 ENCOUNTER — Other Ambulatory Visit: Payer: Self-pay | Admitting: Internal Medicine

## 2020-09-10 DIAGNOSIS — R928 Other abnormal and inconclusive findings on diagnostic imaging of breast: Secondary | ICD-10-CM

## 2020-09-10 DIAGNOSIS — N6320 Unspecified lump in the left breast, unspecified quadrant: Secondary | ICD-10-CM

## 2020-09-17 ENCOUNTER — Ambulatory Visit
Admission: RE | Admit: 2020-09-17 | Discharge: 2020-09-17 | Disposition: A | Discharge status: Home / Self Care | Payer: Medicare Other | Source: Ambulatory Visit | Attending: Internal Medicine | Admitting: Internal Medicine

## 2020-09-17 ENCOUNTER — Other Ambulatory Visit: Payer: Self-pay

## 2020-09-17 DIAGNOSIS — N6320 Unspecified lump in the left breast, unspecified quadrant: Secondary | ICD-10-CM

## 2020-09-17 DIAGNOSIS — R928 Other abnormal and inconclusive findings on diagnostic imaging of breast: Secondary | ICD-10-CM | POA: Insufficient documentation

## 2020-09-25 DIAGNOSIS — M1000 Idiopathic gout, unspecified site: Secondary | ICD-10-CM | POA: Diagnosis not present

## 2020-09-25 DIAGNOSIS — I10 Essential (primary) hypertension: Secondary | ICD-10-CM | POA: Diagnosis not present

## 2020-09-25 DIAGNOSIS — D631 Anemia in chronic kidney disease: Secondary | ICD-10-CM | POA: Diagnosis not present

## 2020-09-25 DIAGNOSIS — R809 Proteinuria, unspecified: Secondary | ICD-10-CM | POA: Diagnosis not present

## 2020-09-25 DIAGNOSIS — E1122 Type 2 diabetes mellitus with diabetic chronic kidney disease: Secondary | ICD-10-CM | POA: Diagnosis not present

## 2020-09-25 DIAGNOSIS — N1832 Chronic kidney disease, stage 3b: Secondary | ICD-10-CM | POA: Diagnosis not present

## 2020-09-25 DIAGNOSIS — N2581 Secondary hyperparathyroidism of renal origin: Secondary | ICD-10-CM | POA: Diagnosis not present

## 2020-10-02 DIAGNOSIS — M19071 Primary osteoarthritis, right ankle and foot: Secondary | ICD-10-CM | POA: Diagnosis not present

## 2020-10-02 DIAGNOSIS — E119 Type 2 diabetes mellitus without complications: Secondary | ICD-10-CM | POA: Diagnosis not present

## 2020-10-02 DIAGNOSIS — Q66221 Congenital metatarsus adductus, right foot: Secondary | ICD-10-CM | POA: Diagnosis not present

## 2020-10-02 DIAGNOSIS — M79672 Pain in left foot: Secondary | ICD-10-CM | POA: Diagnosis not present

## 2020-10-02 DIAGNOSIS — Q66222 Congenital metatarsus adductus, left foot: Secondary | ICD-10-CM | POA: Diagnosis not present

## 2020-10-02 DIAGNOSIS — M21612 Bunion of left foot: Secondary | ICD-10-CM | POA: Diagnosis not present

## 2020-10-02 DIAGNOSIS — M2011 Hallux valgus (acquired), right foot: Secondary | ICD-10-CM | POA: Diagnosis not present

## 2020-10-02 DIAGNOSIS — M2012 Hallux valgus (acquired), left foot: Secondary | ICD-10-CM | POA: Diagnosis not present

## 2020-10-02 DIAGNOSIS — M19072 Primary osteoarthritis, left ankle and foot: Secondary | ICD-10-CM | POA: Diagnosis not present

## 2020-10-02 DIAGNOSIS — M21611 Bunion of right foot: Secondary | ICD-10-CM | POA: Diagnosis not present

## 2020-10-02 DIAGNOSIS — M79671 Pain in right foot: Secondary | ICD-10-CM | POA: Diagnosis not present

## 2020-10-06 DIAGNOSIS — E782 Mixed hyperlipidemia: Secondary | ICD-10-CM | POA: Diagnosis not present

## 2020-10-06 DIAGNOSIS — I10 Essential (primary) hypertension: Secondary | ICD-10-CM | POA: Diagnosis not present

## 2020-10-06 DIAGNOSIS — E1122 Type 2 diabetes mellitus with diabetic chronic kidney disease: Secondary | ICD-10-CM | POA: Diagnosis not present

## 2020-10-06 DIAGNOSIS — I2510 Atherosclerotic heart disease of native coronary artery without angina pectoris: Secondary | ICD-10-CM | POA: Diagnosis not present

## 2020-10-09 DIAGNOSIS — E1122 Type 2 diabetes mellitus with diabetic chronic kidney disease: Secondary | ICD-10-CM | POA: Diagnosis not present

## 2020-10-09 DIAGNOSIS — B372 Candidiasis of skin and nail: Secondary | ICD-10-CM | POA: Diagnosis not present

## 2020-10-09 DIAGNOSIS — E782 Mixed hyperlipidemia: Secondary | ICD-10-CM | POA: Diagnosis not present

## 2020-10-09 DIAGNOSIS — I10 Essential (primary) hypertension: Secondary | ICD-10-CM | POA: Diagnosis not present

## 2020-10-13 DIAGNOSIS — E782 Mixed hyperlipidemia: Secondary | ICD-10-CM | POA: Diagnosis not present

## 2020-10-13 DIAGNOSIS — I10 Essential (primary) hypertension: Secondary | ICD-10-CM | POA: Diagnosis not present

## 2020-10-13 DIAGNOSIS — I2510 Atherosclerotic heart disease of native coronary artery without angina pectoris: Secondary | ICD-10-CM | POA: Diagnosis not present

## 2020-10-13 DIAGNOSIS — R0602 Shortness of breath: Secondary | ICD-10-CM | POA: Diagnosis not present

## 2020-11-03 DIAGNOSIS — M2011 Hallux valgus (acquired), right foot: Secondary | ICD-10-CM | POA: Diagnosis not present

## 2020-11-03 DIAGNOSIS — Q66222 Congenital metatarsus adductus, left foot: Secondary | ICD-10-CM | POA: Diagnosis not present

## 2020-11-03 DIAGNOSIS — Q66221 Congenital metatarsus adductus, right foot: Secondary | ICD-10-CM | POA: Diagnosis not present

## 2020-11-03 DIAGNOSIS — M21612 Bunion of left foot: Secondary | ICD-10-CM | POA: Diagnosis not present

## 2020-11-03 DIAGNOSIS — M21611 Bunion of right foot: Secondary | ICD-10-CM | POA: Diagnosis not present

## 2020-11-03 DIAGNOSIS — M2012 Hallux valgus (acquired), left foot: Secondary | ICD-10-CM | POA: Diagnosis not present

## 2020-11-03 DIAGNOSIS — E119 Type 2 diabetes mellitus without complications: Secondary | ICD-10-CM | POA: Diagnosis not present

## 2020-11-03 DIAGNOSIS — M19071 Primary osteoarthritis, right ankle and foot: Secondary | ICD-10-CM | POA: Diagnosis not present

## 2020-11-03 DIAGNOSIS — M19072 Primary osteoarthritis, left ankle and foot: Secondary | ICD-10-CM | POA: Diagnosis not present

## 2020-11-05 DIAGNOSIS — M79672 Pain in left foot: Secondary | ICD-10-CM | POA: Diagnosis not present

## 2020-11-05 DIAGNOSIS — M19072 Primary osteoarthritis, left ankle and foot: Secondary | ICD-10-CM | POA: Diagnosis not present

## 2020-11-05 DIAGNOSIS — M21612 Bunion of left foot: Secondary | ICD-10-CM | POA: Diagnosis not present

## 2020-11-05 DIAGNOSIS — M2012 Hallux valgus (acquired), left foot: Secondary | ICD-10-CM | POA: Diagnosis not present

## 2020-11-13 DIAGNOSIS — Q66222 Congenital metatarsus adductus, left foot: Secondary | ICD-10-CM | POA: Diagnosis not present

## 2020-11-13 DIAGNOSIS — Q66221 Congenital metatarsus adductus, right foot: Secondary | ICD-10-CM | POA: Diagnosis not present

## 2020-11-13 DIAGNOSIS — M19072 Primary osteoarthritis, left ankle and foot: Secondary | ICD-10-CM | POA: Diagnosis not present

## 2020-11-13 DIAGNOSIS — M21612 Bunion of left foot: Secondary | ICD-10-CM | POA: Diagnosis not present

## 2020-11-13 DIAGNOSIS — M2012 Hallux valgus (acquired), left foot: Secondary | ICD-10-CM | POA: Diagnosis not present

## 2020-11-13 DIAGNOSIS — E119 Type 2 diabetes mellitus without complications: Secondary | ICD-10-CM | POA: Diagnosis not present

## 2020-11-14 DIAGNOSIS — E1122 Type 2 diabetes mellitus with diabetic chronic kidney disease: Secondary | ICD-10-CM | POA: Diagnosis not present

## 2020-11-14 DIAGNOSIS — E782 Mixed hyperlipidemia: Secondary | ICD-10-CM | POA: Diagnosis not present

## 2020-11-14 DIAGNOSIS — N390 Urinary tract infection, site not specified: Secondary | ICD-10-CM | POA: Diagnosis not present

## 2020-11-14 DIAGNOSIS — I10 Essential (primary) hypertension: Secondary | ICD-10-CM | POA: Diagnosis not present

## 2020-11-14 DIAGNOSIS — M109 Gout, unspecified: Secondary | ICD-10-CM | POA: Diagnosis not present

## 2020-11-14 DIAGNOSIS — J209 Acute bronchitis, unspecified: Secondary | ICD-10-CM | POA: Diagnosis not present

## 2020-11-14 DIAGNOSIS — Z20822 Contact with and (suspected) exposure to covid-19: Secondary | ICD-10-CM | POA: Diagnosis not present

## 2020-11-27 DIAGNOSIS — N2581 Secondary hyperparathyroidism of renal origin: Secondary | ICD-10-CM | POA: Diagnosis not present

## 2020-11-27 DIAGNOSIS — N184 Chronic kidney disease, stage 4 (severe): Secondary | ICD-10-CM | POA: Diagnosis not present

## 2020-11-27 DIAGNOSIS — N261 Atrophy of kidney (terminal): Secondary | ICD-10-CM | POA: Diagnosis not present

## 2020-11-27 DIAGNOSIS — R609 Edema, unspecified: Secondary | ICD-10-CM | POA: Diagnosis not present

## 2020-11-27 DIAGNOSIS — E1122 Type 2 diabetes mellitus with diabetic chronic kidney disease: Secondary | ICD-10-CM | POA: Diagnosis not present

## 2020-11-27 DIAGNOSIS — D631 Anemia in chronic kidney disease: Secondary | ICD-10-CM | POA: Diagnosis not present

## 2020-11-27 DIAGNOSIS — R809 Proteinuria, unspecified: Secondary | ICD-10-CM | POA: Diagnosis not present

## 2020-11-27 DIAGNOSIS — I10 Essential (primary) hypertension: Secondary | ICD-10-CM | POA: Diagnosis not present

## 2020-11-28 DIAGNOSIS — E782 Mixed hyperlipidemia: Secondary | ICD-10-CM | POA: Diagnosis not present

## 2020-11-28 DIAGNOSIS — E1122 Type 2 diabetes mellitus with diabetic chronic kidney disease: Secondary | ICD-10-CM | POA: Diagnosis not present

## 2020-11-28 DIAGNOSIS — N390 Urinary tract infection, site not specified: Secondary | ICD-10-CM | POA: Diagnosis not present

## 2020-11-28 DIAGNOSIS — I10 Essential (primary) hypertension: Secondary | ICD-10-CM | POA: Diagnosis not present

## 2020-12-02 DIAGNOSIS — M21612 Bunion of left foot: Secondary | ICD-10-CM | POA: Diagnosis not present

## 2020-12-02 DIAGNOSIS — M2012 Hallux valgus (acquired), left foot: Secondary | ICD-10-CM | POA: Diagnosis not present

## 2020-12-02 DIAGNOSIS — Q66222 Congenital metatarsus adductus, left foot: Secondary | ICD-10-CM | POA: Diagnosis not present

## 2020-12-02 DIAGNOSIS — E119 Type 2 diabetes mellitus without complications: Secondary | ICD-10-CM | POA: Diagnosis not present

## 2020-12-02 DIAGNOSIS — M19072 Primary osteoarthritis, left ankle and foot: Secondary | ICD-10-CM | POA: Diagnosis not present

## 2020-12-02 DIAGNOSIS — Q66221 Congenital metatarsus adductus, right foot: Secondary | ICD-10-CM | POA: Diagnosis not present

## 2020-12-11 DIAGNOSIS — M2012 Hallux valgus (acquired), left foot: Secondary | ICD-10-CM | POA: Diagnosis not present

## 2020-12-11 DIAGNOSIS — M21612 Bunion of left foot: Secondary | ICD-10-CM | POA: Diagnosis not present

## 2020-12-16 DIAGNOSIS — R700 Elevated erythrocyte sedimentation rate: Secondary | ICD-10-CM | POA: Diagnosis not present

## 2020-12-16 DIAGNOSIS — M1990 Unspecified osteoarthritis, unspecified site: Secondary | ICD-10-CM | POA: Diagnosis not present

## 2020-12-16 DIAGNOSIS — M1A30X0 Chronic gout due to renal impairment, unspecified site, without tophus (tophi): Secondary | ICD-10-CM | POA: Diagnosis not present

## 2020-12-16 DIAGNOSIS — M7061 Trochanteric bursitis, right hip: Secondary | ICD-10-CM | POA: Diagnosis not present

## 2020-12-29 DIAGNOSIS — M25511 Pain in right shoulder: Secondary | ICD-10-CM | POA: Diagnosis not present

## 2020-12-29 DIAGNOSIS — M542 Cervicalgia: Secondary | ICD-10-CM | POA: Diagnosis not present

## 2020-12-29 DIAGNOSIS — G8929 Other chronic pain: Secondary | ICD-10-CM | POA: Diagnosis not present

## 2020-12-30 DIAGNOSIS — M7581 Other shoulder lesions, right shoulder: Secondary | ICD-10-CM | POA: Diagnosis not present

## 2020-12-30 DIAGNOSIS — M47812 Spondylosis without myelopathy or radiculopathy, cervical region: Secondary | ICD-10-CM | POA: Diagnosis not present

## 2020-12-30 DIAGNOSIS — M542 Cervicalgia: Secondary | ICD-10-CM | POA: Diagnosis not present

## 2020-12-30 DIAGNOSIS — I10 Essential (primary) hypertension: Secondary | ICD-10-CM | POA: Diagnosis not present

## 2020-12-30 DIAGNOSIS — M5033 Other cervical disc degeneration, cervicothoracic region: Secondary | ICD-10-CM | POA: Diagnosis not present

## 2020-12-30 DIAGNOSIS — E1122 Type 2 diabetes mellitus with diabetic chronic kidney disease: Secondary | ICD-10-CM | POA: Diagnosis not present

## 2020-12-30 DIAGNOSIS — Z4789 Encounter for other orthopedic aftercare: Secondary | ICD-10-CM | POA: Diagnosis not present

## 2021-01-01 ENCOUNTER — Other Ambulatory Visit: Payer: Self-pay | Admitting: Rheumatology

## 2021-01-01 DIAGNOSIS — M542 Cervicalgia: Secondary | ICD-10-CM

## 2021-01-01 DIAGNOSIS — G8929 Other chronic pain: Secondary | ICD-10-CM

## 2021-01-01 DIAGNOSIS — M25511 Pain in right shoulder: Secondary | ICD-10-CM

## 2021-01-05 DIAGNOSIS — M2012 Hallux valgus (acquired), left foot: Secondary | ICD-10-CM | POA: Diagnosis not present

## 2021-01-05 DIAGNOSIS — Q66221 Congenital metatarsus adductus, right foot: Secondary | ICD-10-CM | POA: Diagnosis not present

## 2021-01-05 DIAGNOSIS — Q66222 Congenital metatarsus adductus, left foot: Secondary | ICD-10-CM | POA: Diagnosis not present

## 2021-01-05 DIAGNOSIS — M21612 Bunion of left foot: Secondary | ICD-10-CM | POA: Diagnosis not present

## 2021-01-05 DIAGNOSIS — E119 Type 2 diabetes mellitus without complications: Secondary | ICD-10-CM | POA: Diagnosis not present

## 2021-01-05 DIAGNOSIS — M19072 Primary osteoarthritis, left ankle and foot: Secondary | ICD-10-CM | POA: Diagnosis not present

## 2021-01-06 DIAGNOSIS — M50321 Other cervical disc degeneration at C4-C5 level: Secondary | ICD-10-CM | POA: Diagnosis not present

## 2021-01-06 DIAGNOSIS — E782 Mixed hyperlipidemia: Secondary | ICD-10-CM | POA: Diagnosis not present

## 2021-01-06 DIAGNOSIS — I10 Essential (primary) hypertension: Secondary | ICD-10-CM | POA: Diagnosis not present

## 2021-01-06 DIAGNOSIS — E1122 Type 2 diabetes mellitus with diabetic chronic kidney disease: Secondary | ICD-10-CM | POA: Diagnosis not present

## 2021-01-12 ENCOUNTER — Other Ambulatory Visit: Payer: Self-pay

## 2021-01-12 ENCOUNTER — Ambulatory Visit
Admission: RE | Admit: 2021-01-12 | Discharge: 2021-01-12 | Disposition: A | Discharge status: Home / Self Care | Payer: Medicare Other | Source: Ambulatory Visit | Attending: Rheumatology | Admitting: Rheumatology

## 2021-01-12 DIAGNOSIS — I2510 Atherosclerotic heart disease of native coronary artery without angina pectoris: Secondary | ICD-10-CM | POA: Diagnosis not present

## 2021-01-12 DIAGNOSIS — M25511 Pain in right shoulder: Secondary | ICD-10-CM | POA: Insufficient documentation

## 2021-01-12 DIAGNOSIS — I739 Peripheral vascular disease, unspecified: Secondary | ICD-10-CM | POA: Diagnosis not present

## 2021-01-12 DIAGNOSIS — M542 Cervicalgia: Secondary | ICD-10-CM

## 2021-01-12 DIAGNOSIS — G8929 Other chronic pain: Secondary | ICD-10-CM | POA: Diagnosis not present

## 2021-01-12 DIAGNOSIS — I10 Essential (primary) hypertension: Secondary | ICD-10-CM | POA: Diagnosis not present

## 2021-01-12 DIAGNOSIS — E782 Mixed hyperlipidemia: Secondary | ICD-10-CM | POA: Diagnosis not present

## 2021-01-15 DIAGNOSIS — M4694 Unspecified inflammatory spondylopathy, thoracic region: Secondary | ICD-10-CM | POA: Diagnosis not present

## 2021-01-15 DIAGNOSIS — M4714 Other spondylosis with myelopathy, thoracic region: Secondary | ICD-10-CM | POA: Diagnosis not present

## 2021-01-16 ENCOUNTER — Other Ambulatory Visit: Payer: Self-pay | Admitting: Neurological Surgery

## 2021-01-16 ENCOUNTER — Other Ambulatory Visit: Payer: Self-pay | Admitting: Cardiovascular Disease

## 2021-01-16 DIAGNOSIS — M4694 Unspecified inflammatory spondylopathy, thoracic region: Secondary | ICD-10-CM

## 2021-01-22 DIAGNOSIS — E782 Mixed hyperlipidemia: Secondary | ICD-10-CM | POA: Diagnosis not present

## 2021-01-22 DIAGNOSIS — E1122 Type 2 diabetes mellitus with diabetic chronic kidney disease: Secondary | ICD-10-CM | POA: Diagnosis not present

## 2021-01-22 DIAGNOSIS — I2510 Atherosclerotic heart disease of native coronary artery without angina pectoris: Secondary | ICD-10-CM | POA: Diagnosis not present

## 2021-01-22 DIAGNOSIS — I10 Essential (primary) hypertension: Secondary | ICD-10-CM | POA: Diagnosis not present

## 2021-01-22 DIAGNOSIS — M4804 Spinal stenosis, thoracic region: Secondary | ICD-10-CM | POA: Diagnosis not present

## 2021-01-27 ENCOUNTER — Ambulatory Visit
Admission: RE | Admit: 2021-01-27 | Discharge: 2021-01-27 | Disposition: A | Discharge status: Home / Self Care | Payer: Medicare Other | Source: Ambulatory Visit | Attending: Neurological Surgery | Admitting: Neurological Surgery

## 2021-01-27 DIAGNOSIS — M4694 Unspecified inflammatory spondylopathy, thoracic region: Secondary | ICD-10-CM

## 2021-01-27 DIAGNOSIS — M4693 Unspecified inflammatory spondylopathy, cervicothoracic region: Secondary | ICD-10-CM | POA: Diagnosis not present

## 2021-01-27 DIAGNOSIS — M5413 Radiculopathy, cervicothoracic region: Secondary | ICD-10-CM | POA: Diagnosis not present

## 2021-01-27 MED ORDER — TRIAMCINOLONE ACETONIDE 40 MG/ML IJ SUSP (RADIOLOGY)
60.0000 mg | Freq: Once | INTRAMUSCULAR | Status: AC
  Administered 2021-01-27: 60 mg via INTRA_ARTICULAR

## 2021-01-27 MED ORDER — IOPAMIDOL (ISOVUE-M 300) INJECTION 61%
1.0000 mL | Freq: Once | INTRAMUSCULAR | Status: AC
  Administered 2021-01-27: 1 mL via INTRA_ARTICULAR

## 2021-01-27 NOTE — Discharge Instructions (Signed)

## 2021-01-29 DIAGNOSIS — E782 Mixed hyperlipidemia: Secondary | ICD-10-CM | POA: Diagnosis not present

## 2021-01-29 DIAGNOSIS — I10 Essential (primary) hypertension: Secondary | ICD-10-CM | POA: Diagnosis not present

## 2021-01-29 DIAGNOSIS — I2510 Atherosclerotic heart disease of native coronary artery without angina pectoris: Secondary | ICD-10-CM | POA: Diagnosis not present

## 2021-01-29 DIAGNOSIS — I739 Peripheral vascular disease, unspecified: Secondary | ICD-10-CM | POA: Diagnosis not present

## 2021-02-05 DIAGNOSIS — N184 Chronic kidney disease, stage 4 (severe): Secondary | ICD-10-CM | POA: Diagnosis not present

## 2021-02-05 DIAGNOSIS — R809 Proteinuria, unspecified: Secondary | ICD-10-CM | POA: Diagnosis not present

## 2021-02-05 DIAGNOSIS — E785 Hyperlipidemia, unspecified: Secondary | ICD-10-CM | POA: Diagnosis not present

## 2021-02-05 DIAGNOSIS — D631 Anemia in chronic kidney disease: Secondary | ICD-10-CM | POA: Diagnosis not present

## 2021-02-05 DIAGNOSIS — I10 Essential (primary) hypertension: Secondary | ICD-10-CM | POA: Diagnosis not present

## 2021-02-05 DIAGNOSIS — N2581 Secondary hyperparathyroidism of renal origin: Secondary | ICD-10-CM | POA: Diagnosis not present

## 2021-02-05 DIAGNOSIS — E119 Type 2 diabetes mellitus without complications: Secondary | ICD-10-CM | POA: Diagnosis not present

## 2021-02-05 DIAGNOSIS — N289 Disorder of kidney and ureter, unspecified: Secondary | ICD-10-CM | POA: Diagnosis not present

## 2021-02-05 DIAGNOSIS — N281 Cyst of kidney, acquired: Secondary | ICD-10-CM | POA: Diagnosis not present

## 2021-02-05 DIAGNOSIS — I2510 Atherosclerotic heart disease of native coronary artery without angina pectoris: Secondary | ICD-10-CM | POA: Diagnosis not present

## 2021-02-09 DIAGNOSIS — Q66222 Congenital metatarsus adductus, left foot: Secondary | ICD-10-CM | POA: Diagnosis not present

## 2021-02-09 DIAGNOSIS — S91302D Unspecified open wound, left foot, subsequent encounter: Secondary | ICD-10-CM | POA: Diagnosis not present

## 2021-02-09 DIAGNOSIS — Q66221 Congenital metatarsus adductus, right foot: Secondary | ICD-10-CM | POA: Diagnosis not present

## 2021-02-09 DIAGNOSIS — M2012 Hallux valgus (acquired), left foot: Secondary | ICD-10-CM | POA: Diagnosis not present

## 2021-02-09 DIAGNOSIS — E119 Type 2 diabetes mellitus without complications: Secondary | ICD-10-CM | POA: Diagnosis not present

## 2021-02-09 DIAGNOSIS — M19072 Primary osteoarthritis, left ankle and foot: Secondary | ICD-10-CM | POA: Diagnosis not present

## 2021-02-09 DIAGNOSIS — M21612 Bunion of left foot: Secondary | ICD-10-CM | POA: Diagnosis not present

## 2021-02-18 DIAGNOSIS — M4714 Other spondylosis with myelopathy, thoracic region: Secondary | ICD-10-CM | POA: Diagnosis not present

## 2021-02-18 DIAGNOSIS — M12819 Other specific arthropathies, not elsewhere classified, unspecified shoulder: Secondary | ICD-10-CM | POA: Diagnosis not present

## 2021-02-18 DIAGNOSIS — I10 Essential (primary) hypertension: Secondary | ICD-10-CM | POA: Diagnosis not present

## 2021-02-20 ENCOUNTER — Other Ambulatory Visit: Payer: Self-pay | Admitting: Orthopaedic Surgery

## 2021-02-20 DIAGNOSIS — M25511 Pain in right shoulder: Secondary | ICD-10-CM

## 2021-03-05 DIAGNOSIS — E1122 Type 2 diabetes mellitus with diabetic chronic kidney disease: Secondary | ICD-10-CM | POA: Diagnosis not present

## 2021-03-05 DIAGNOSIS — I10 Essential (primary) hypertension: Secondary | ICD-10-CM | POA: Diagnosis not present

## 2021-03-05 DIAGNOSIS — E782 Mixed hyperlipidemia: Secondary | ICD-10-CM | POA: Diagnosis not present

## 2021-03-05 DIAGNOSIS — I2510 Atherosclerotic heart disease of native coronary artery without angina pectoris: Secondary | ICD-10-CM | POA: Diagnosis not present

## 2021-03-07 ENCOUNTER — Ambulatory Visit
Admission: RE | Admit: 2021-03-07 | Discharge: 2021-03-07 | Disposition: A | Discharge status: Home / Self Care | Payer: Medicare Other | Source: Ambulatory Visit | Attending: Orthopaedic Surgery | Admitting: Orthopaedic Surgery

## 2021-03-07 DIAGNOSIS — M25511 Pain in right shoulder: Secondary | ICD-10-CM

## 2021-03-07 DIAGNOSIS — M19011 Primary osteoarthritis, right shoulder: Secondary | ICD-10-CM | POA: Diagnosis not present

## 2021-03-31 DIAGNOSIS — I340 Nonrheumatic mitral (valve) insufficiency: Secondary | ICD-10-CM | POA: Diagnosis not present

## 2021-03-31 DIAGNOSIS — I10 Essential (primary) hypertension: Secondary | ICD-10-CM | POA: Diagnosis not present

## 2021-03-31 DIAGNOSIS — I2510 Atherosclerotic heart disease of native coronary artery without angina pectoris: Secondary | ICD-10-CM | POA: Diagnosis not present

## 2021-03-31 DIAGNOSIS — E782 Mixed hyperlipidemia: Secondary | ICD-10-CM | POA: Diagnosis not present

## 2021-03-31 DIAGNOSIS — I739 Peripheral vascular disease, unspecified: Secondary | ICD-10-CM | POA: Diagnosis not present

## 2021-03-31 DIAGNOSIS — I351 Nonrheumatic aortic (valve) insufficiency: Secondary | ICD-10-CM | POA: Diagnosis not present

## 2021-03-31 DIAGNOSIS — E1122 Type 2 diabetes mellitus with diabetic chronic kidney disease: Secondary | ICD-10-CM | POA: Diagnosis not present

## 2021-04-02 DIAGNOSIS — M21611 Bunion of right foot: Secondary | ICD-10-CM | POA: Diagnosis not present

## 2021-04-02 DIAGNOSIS — M2011 Hallux valgus (acquired), right foot: Secondary | ICD-10-CM | POA: Diagnosis not present

## 2021-04-02 DIAGNOSIS — M79671 Pain in right foot: Secondary | ICD-10-CM | POA: Diagnosis not present

## 2021-04-02 DIAGNOSIS — M19071 Primary osteoarthritis, right ankle and foot: Secondary | ICD-10-CM | POA: Diagnosis not present

## 2021-04-02 DIAGNOSIS — M79672 Pain in left foot: Secondary | ICD-10-CM | POA: Diagnosis not present

## 2021-04-02 DIAGNOSIS — E119 Type 2 diabetes mellitus without complications: Secondary | ICD-10-CM | POA: Diagnosis not present

## 2021-04-08 DIAGNOSIS — M2011 Hallux valgus (acquired), right foot: Secondary | ICD-10-CM | POA: Diagnosis not present

## 2021-04-08 DIAGNOSIS — M79671 Pain in right foot: Secondary | ICD-10-CM | POA: Diagnosis not present

## 2021-04-08 DIAGNOSIS — M25572 Pain in left ankle and joints of left foot: Secondary | ICD-10-CM | POA: Diagnosis not present

## 2021-04-08 DIAGNOSIS — M21611 Bunion of right foot: Secondary | ICD-10-CM | POA: Diagnosis not present

## 2021-04-14 DIAGNOSIS — E119 Type 2 diabetes mellitus without complications: Secondary | ICD-10-CM | POA: Diagnosis not present

## 2021-04-14 DIAGNOSIS — M2011 Hallux valgus (acquired), right foot: Secondary | ICD-10-CM | POA: Diagnosis not present

## 2021-04-14 DIAGNOSIS — M19071 Primary osteoarthritis, right ankle and foot: Secondary | ICD-10-CM | POA: Diagnosis not present

## 2021-04-14 DIAGNOSIS — M21611 Bunion of right foot: Secondary | ICD-10-CM | POA: Diagnosis not present

## 2021-04-28 DIAGNOSIS — M21611 Bunion of right foot: Secondary | ICD-10-CM | POA: Diagnosis not present

## 2021-04-28 DIAGNOSIS — E119 Type 2 diabetes mellitus without complications: Secondary | ICD-10-CM | POA: Diagnosis not present

## 2021-04-28 DIAGNOSIS — M2011 Hallux valgus (acquired), right foot: Secondary | ICD-10-CM | POA: Diagnosis not present

## 2021-04-28 DIAGNOSIS — M19071 Primary osteoarthritis, right ankle and foot: Secondary | ICD-10-CM | POA: Diagnosis not present

## 2021-05-07 DIAGNOSIS — E1122 Type 2 diabetes mellitus with diabetic chronic kidney disease: Secondary | ICD-10-CM | POA: Diagnosis not present

## 2021-05-07 DIAGNOSIS — I10 Essential (primary) hypertension: Secondary | ICD-10-CM | POA: Diagnosis not present

## 2021-05-07 DIAGNOSIS — Z112 Encounter for screening for other bacterial diseases: Secondary | ICD-10-CM | POA: Diagnosis not present

## 2021-05-07 DIAGNOSIS — N390 Urinary tract infection, site not specified: Secondary | ICD-10-CM | POA: Diagnosis not present

## 2021-05-07 DIAGNOSIS — E782 Mixed hyperlipidemia: Secondary | ICD-10-CM | POA: Diagnosis not present

## 2021-05-07 DIAGNOSIS — I2510 Atherosclerotic heart disease of native coronary artery without angina pectoris: Secondary | ICD-10-CM | POA: Diagnosis not present

## 2021-05-18 ENCOUNTER — Other Ambulatory Visit: Payer: Self-pay | Admitting: Internal Medicine

## 2021-05-18 DIAGNOSIS — N950 Postmenopausal bleeding: Secondary | ICD-10-CM

## 2021-05-18 DIAGNOSIS — I10 Essential (primary) hypertension: Secondary | ICD-10-CM | POA: Diagnosis not present

## 2021-05-18 DIAGNOSIS — E1122 Type 2 diabetes mellitus with diabetic chronic kidney disease: Secondary | ICD-10-CM | POA: Diagnosis not present

## 2021-05-18 DIAGNOSIS — I2510 Atherosclerotic heart disease of native coronary artery without angina pectoris: Secondary | ICD-10-CM | POA: Diagnosis not present

## 2021-05-18 DIAGNOSIS — E782 Mixed hyperlipidemia: Secondary | ICD-10-CM | POA: Diagnosis not present

## 2021-05-26 DIAGNOSIS — M2012 Hallux valgus (acquired), left foot: Secondary | ICD-10-CM | POA: Diagnosis not present

## 2021-05-26 DIAGNOSIS — M2011 Hallux valgus (acquired), right foot: Secondary | ICD-10-CM | POA: Diagnosis not present

## 2021-05-26 DIAGNOSIS — M21611 Bunion of right foot: Secondary | ICD-10-CM | POA: Diagnosis not present

## 2021-05-26 DIAGNOSIS — M21612 Bunion of left foot: Secondary | ICD-10-CM | POA: Diagnosis not present

## 2021-05-26 DIAGNOSIS — M19072 Primary osteoarthritis, left ankle and foot: Secondary | ICD-10-CM | POA: Diagnosis not present

## 2021-05-26 DIAGNOSIS — M19071 Primary osteoarthritis, right ankle and foot: Secondary | ICD-10-CM | POA: Diagnosis not present

## 2021-05-26 DIAGNOSIS — E119 Type 2 diabetes mellitus without complications: Secondary | ICD-10-CM | POA: Diagnosis not present

## 2021-05-27 ENCOUNTER — Ambulatory Visit
Admission: RE | Admit: 2021-05-27 | Discharge: 2021-05-27 | Disposition: A | Discharge status: Home / Self Care | Payer: Medicare Other | Source: Ambulatory Visit | Attending: Internal Medicine | Admitting: Internal Medicine

## 2021-05-27 ENCOUNTER — Other Ambulatory Visit: Payer: Self-pay

## 2021-05-27 DIAGNOSIS — R9389 Abnormal findings on diagnostic imaging of other specified body structures: Secondary | ICD-10-CM | POA: Diagnosis not present

## 2021-05-27 DIAGNOSIS — N950 Postmenopausal bleeding: Secondary | ICD-10-CM | POA: Insufficient documentation

## 2021-06-17 DIAGNOSIS — M7061 Trochanteric bursitis, right hip: Secondary | ICD-10-CM | POA: Diagnosis not present

## 2021-06-17 DIAGNOSIS — G8929 Other chronic pain: Secondary | ICD-10-CM | POA: Diagnosis not present

## 2021-06-17 DIAGNOSIS — M545 Low back pain, unspecified: Secondary | ICD-10-CM | POA: Diagnosis not present

## 2021-06-25 ENCOUNTER — Encounter: Payer: Medicare Other | Admitting: Obstetrics and Gynecology

## 2021-06-25 DIAGNOSIS — R0602 Shortness of breath: Secondary | ICD-10-CM | POA: Diagnosis not present

## 2021-06-25 DIAGNOSIS — E782 Mixed hyperlipidemia: Secondary | ICD-10-CM | POA: Diagnosis not present

## 2021-06-25 DIAGNOSIS — I2510 Atherosclerotic heart disease of native coronary artery without angina pectoris: Secondary | ICD-10-CM | POA: Diagnosis not present

## 2021-06-25 DIAGNOSIS — E119 Type 2 diabetes mellitus without complications: Secondary | ICD-10-CM | POA: Diagnosis not present

## 2021-06-25 DIAGNOSIS — R079 Chest pain, unspecified: Secondary | ICD-10-CM | POA: Diagnosis not present

## 2021-06-25 DIAGNOSIS — I340 Nonrheumatic mitral (valve) insufficiency: Secondary | ICD-10-CM | POA: Diagnosis not present

## 2021-06-25 DIAGNOSIS — I10 Essential (primary) hypertension: Secondary | ICD-10-CM | POA: Diagnosis not present

## 2021-06-25 DIAGNOSIS — I351 Nonrheumatic aortic (valve) insufficiency: Secondary | ICD-10-CM | POA: Diagnosis not present

## 2021-06-25 DIAGNOSIS — I739 Peripheral vascular disease, unspecified: Secondary | ICD-10-CM | POA: Diagnosis not present

## 2021-06-29 ENCOUNTER — Other Ambulatory Visit (HOSPITAL_COMMUNITY)
Admission: RE | Admit: 2021-06-29 | Discharge: 2021-06-29 | Disposition: A | Discharge status: Home / Self Care | Payer: Medicare Other | Source: Ambulatory Visit | Attending: Obstetrics and Gynecology | Admitting: Obstetrics and Gynecology

## 2021-06-29 ENCOUNTER — Encounter: Payer: Self-pay | Admitting: Obstetrics and Gynecology

## 2021-06-29 ENCOUNTER — Ambulatory Visit (INDEPENDENT_AMBULATORY_CARE_PROVIDER_SITE_OTHER): Payer: Medicare Other | Admitting: Obstetrics and Gynecology

## 2021-06-29 ENCOUNTER — Other Ambulatory Visit: Payer: Self-pay

## 2021-06-29 VITALS — BP 132/70 | Ht 63.0 in | Wt 179.2 lb

## 2021-06-29 DIAGNOSIS — R9389 Abnormal findings on diagnostic imaging of other specified body structures: Secondary | ICD-10-CM

## 2021-06-29 DIAGNOSIS — N950 Postmenopausal bleeding: Secondary | ICD-10-CM | POA: Diagnosis present

## 2021-06-29 NOTE — Patient Instructions (Signed)
ENDOMETRIAL BIOPSY POST-PROCEDURE INSTRUCTIONS  You may take Ibuprofen, Aleve or Tylenol for pain if needed.  Cramping should resolve within in 24 hours.  You may have a small amount of spotting.  You should wear a mini pad for the next few days.  You may have intercourse after 24 hours.  You need to call if you have any pelvic pain, fever, heavy bleeding or foul smelling vaginal discharge.  Shower or bathe as normal  6. We will call you within one week with results or we will discuss   the results at your follow-up appointment if needed.  Endometrial Biopsy An endometrial biopsy is a procedure to remove tissue samples from the endometrium, which is the lining of the uterus. The tissue that is removed can then be checked under a microscope for disease. This procedure is used to diagnose conditions such as endometrial cancer, endometrial tuberculosis, polyps, or other inflammatory conditions. This procedure may also be used to investigate uterine bleeding to determine where you are in your menstrual cycle or how your hormone levels are affecting the lining of the uterus. Tell a health care provider about: Any allergies you have. All medicines you are taking, including vitamins, herbs, eye drops, creams, and over-the-counter medicines. Any problems you or family members have had with anesthetic medicines. Any blood disorders you have. Any surgeries you have had. Any medical conditions you have. Whether you are pregnant or may be pregnant. What are the risks? Generally, this is a safe procedure. However, problems may occur, including: Bleeding. Pelvic infection. Puncture of the wall of the uterus with the biopsy device (rare). Allergic reactions to medicines. What happens before the procedure? Keep a record of your menstrual cycles as told by your health care provider. You may need to schedule your procedure for a specific time in your cycle. You may want to bring a sanitary pad to wear  after the procedure. Plan to have someone take you home from the hospital or clinic. Ask your health care provider about: Changing or stopping your regular medicines. This is especially important if you are taking diabetes medicines, arthritis medicines, or blood thinners. Taking medicines such as aspirin and ibuprofen. These medicines can thin your blood. Do not take these medicines unless your health care provider tells you to take them. Taking over-the-counter medicines, vitamins, herbs, and supplements. What happens during the procedure? You will lie on an exam table with your feet and legs supported as in a pelvic exam. Your health care provider will insert an instrument (speculum) into your vagina to see your cervix. Your cervix will be cleansed with an antiseptic solution. A medicine (local anesthetic) will be used to numb the cervix. A forceps instrument (tenaculum) will be used to hold your cervix steady for the biopsy. A thin, rod-like instrument (uterine sound) will be inserted through your cervix to determine the length of your uterus and the location where the biopsy sample will be removed. A thin, flexible tube (catheter) will be inserted through your cervix and into the uterus. The catheter will be used to collect the biopsy sample from your endometrial tissue. The catheter and speculum will then be removed, and the tissue sample will be sent to a lab for examination. The procedure may vary among health care providers and hospitals. What can I expect after procedure? You will rest in a recovery area until you are ready to go home. You may have mild cramping and a small amount of vaginal bleeding. This is normal. You  may have a small amount of vaginal bleeding for a few days. This is normal. It is up to you to get the results of your procedure. Ask your health care provider, or the department that is doing the procedure, when your results will be ready. Follow these instructions at  home: Take over-the-counter and prescription medicines only as told by your health care provider. Do not douche, use tampons, or have sexual intercourse until your health care provider approves. Return to your normal activities as told by your health care provider. Ask your health care provider what activities are safe for you. Follow instructions from your health care provider about any activity restrictions, such as restrictions on strenuous exercise or heavy lifting. Keep all follow-up visits. This is important. Contact a health care provider: You have heavy bleeding, or bleed for longer than 2 days after the procedure. You have bad smelling discharge from your vagina. You have a fever or chills. You have a burning sensation when urinating or you have difficulty urinating. You have severe pain in your lower abdomen. Get help right away if you: You have severe cramps in your stomach or back. You pass large blood clots. Your bleeding increases. You become weak or light-headed, or you faint or lose consciousness. Summary An endometrial biopsy is a procedure to remove tissue samples is taken from the endometrium, which is the lining of the uterus. The tissue sample that is removed will be checked under a microscope for disease. This procedure is used to diagnose conditions such as endometrial cancer, endometrial tuberculosis, polyps, or other inflammatory conditions. After the procedure, it is common to have mild cramping and a small amount of vaginal bleeding for a few days. Do not douche, use tampons, or have sexual intercourse until your health care provider approves. Ask your health care provider which activities are safe for you. This information is not intended to replace advice given to you by your health care provider. Make sure you discuss any questions you have with your health care provider. Document Revised: 02/18/2021 Document Reviewed: 12/31/2019 Elsevier Patient Education   2022 [employer].

## 2021-06-29 NOTE — Progress Notes (Signed)
Patient ID: [name], female   DOB: 04/20/1946, 76 y.o.   MRN: [id number]  Reason for Consult: Gynecologic Exam   Referred by Perrin Maltese, MD  Subjective:     HPI:  [name] is a 76 y.o. female. She is here today for a consult for postmenopausal bleeding.  She reports that she had a one-time bleeding event in November.  She reports that it was a relatively small amount of blood but she saw her primary care physician who ordered a pelvic ultrasound.  Transvaginal ultrasound showed endometrial thickness of 11 mm.  Gynecological History  No LMP recorded. Patient is postmenopausal. Menarche: 14 Menopause: 26  History of fibroids, polyps, or ovarian cysts? : no  History of PCOS? no Hstory of Endometriosis? no History of abnormal pap smears? no Have you had any sexually transmitted infections in the past? no  She identifies as a female. She is not sexually active.   She denies dyspareunia. She denies postcoital bleeding.  She currently uses abstinence for contraception.   Obstetrical History OB History  Gravida Para Term Preterm AB Living  4 3 3     3   SAB IAB Ectopic Multiple Live Births          3    # Outcome Date GA Lbr Len/2nd Weight Sex Delivery Anes PTL Lv  4 Term 03/30/73    F    LIV  3 Term 86    M Vag-Spont   FD  2 Term 09/21/68    F    LIV  1 Term 03/25/65    F Vag-Spont   LIV     Past Medical History:  Diagnosis Date   Arthritis    Cerebral aneurysm    Chronic kidney disease    Complication of anesthesia    "take a long time to awaken "   Coronary artery disease    Diabetes mellitus    Headache(784.0)    Heart murmur    HOH (hard of hearing)    Has hearing aids, doesn't wear   Hypertension    Neck stiffness    screws in neck.  Some limitations of movement   Sleep apnea    does not use cpap   Systemic lupus erythematosus ([place])    Wears dentures    partial lower   Family History  Problem Relation Age of Onset    Breast cancer Paternal Aunt 91   Breast cancer Paternal 61    Heart disease Mother    Heart disease Father    Stroke Father    Diabetes Sister    Diabetes Brother    Past Surgical History:  Procedure Laterality Date   ANEURYSM COILING  3/15   BACK SURGERY     BARTHOLIN CYST MARSUPIALIZATION     BREAST CYST ASPIRATION Right 01/25/2012   FNA neg.   BREAST EXCISIONAL BIOPSY Left 06/26/2007   neg   CARDIAC CATHETERIZATION  08   stents   CEREBRAL ANEURYSM REPAIR  2008   A COM   CEREBRAL ANEURYSM REPAIR  2008   R MCA   CERVICAL SPINE SURGERY     COLONOSCOPY WITH PROPOFOL N/A 10/04/2019   Procedure: COLONOSCOPY WITH BIOPSY;  Surgeon: Lin Landsman, MD;  Location: [place];  Service: Endoscopy;  Laterality: N/A;  Diabetic (borderline) - oral meds priority 3   EYE SURGERY     cataract   POLYPECTOMY N/A 10/04/2019   Procedure: POLYPECTOMY;  Surgeon: Sherri Sear  Reece Levy, MD;  Location: [place];  Service: Endoscopy;  Laterality: N/A;   UPPER ESOPHAGEAL ENDOSCOPIC ULTRASOUND (EUS) N/A 04/15/2016   Procedure: UPPER ESOPHAGEAL ENDOSCOPIC ULTRASOUND (EUS);  Surgeon: Holly Bodily, MD;  Location[facility] ENDOSCOPY;  Service: Gastroenterology;  Laterality: N/A;    Short Social History:  Social History   Tobacco Use   Smoking status: Never   Smokeless tobacco: Never  Substance Use Topics   Alcohol use: Yes    Comment: Occasionally at christmas    Allergies  Allergen Reactions   Amlodipine Swelling   Sulfa Antibiotics Itching    Current Outpatient Medications  Medication Sig Dispense Refill   atenolol (TENORMIN) 100 MG tablet Take 100 mg by mouth daily.     clopidogrel (PLAVIX) 75 MG tablet Take 75 mg by mouth daily.     furosemide (LASIX) 40 MG tablet Take 40 mg by mouth daily.      gabapentin (NEURONTIN) 100 MG capsule TAKE 1 CAPSULE BY MOUTH EVERYDAY AT BEDTIME     hydrALAZINE (APRESOLINE) 50 MG tablet Take 25 mg by mouth 2 (two) times daily.      hydroxychloroquine (PLAQUENIL) 200 MG tablet TAKE 1 TABLET BY MOUTH daily     labetalol (NORMODYNE) 300 MG tablet Take 300 mg by mouth 2 (two) times daily.  2   losartan (COZAAR) 100 MG tablet Take 100 mg by mouth daily.     mometasone (NASONEX) 50 MCG/ACT nasal spray SPRAY 2 SPRAYS EACH NOSTRIL DAILY     pantoprazole (PROTONIX) 40 MG tablet Take 40 mg by mouth daily.     rosuvastatin (CRESTOR) 5 MG tablet Take 5 mg by mouth at bedtime.  3   tretinoin (RETIN-A) 0.025 % cream APPLY TO THE AFFECTED AREA ONE A DAY     zolpidem (AMBIEN) 5 MG tablet Take 5 mg by mouth at bedtime as needed. For sleep     allopurinol (ZYLOPRIM) 300 MG tablet Take 300 mg by mouth daily. (Patient not taking: Reported on 06/29/2021)     aspirin 81 MG chewable tablet Chew 81 mg by mouth daily. (Patient not taking: Reported on 07/22/2020)     chlorhexidine (PERIDEX) 0.12 % solution Use as directed 15 mLs in the mouth or throat 2 (two) times daily as needed. (Patient not taking: Reported on 07/22/2020)     Cholecalciferol (VITAMIN D3) 10000 UNITS capsule Take 10,000 Units by mouth daily. (Patient not taking: Reported on 07/22/2020)     cyclobenzaprine (FLEXERIL) 5 MG tablet Take 5 mg by mouth 3 (three) times daily as needed for muscle spasms. (Patient not taking: Reported on 07/22/2020)     fexofenadine (ALLEGRA) 180 MG tablet Take 180 mg by mouth daily as needed for allergies or rhinitis. (Patient not taking: Reported on 07/22/2020)     gabapentin (NEURONTIN) 300 MG capsule Take 300 mg by mouth 2 (two) times daily as needed. (Patient not taking: Reported on 07/22/2020)     glimepiride (AMARYL) 2 MG tablet Take 3 mg by mouth daily.      ibandronate (BONIVA) 150 MG tablet Take by mouth. (Patient not taking: Reported on 06/29/2021)     olmesartan (BENICAR) 40 MG tablet Take 40 mg by mouth daily. (Patient not taking: Reported on 06/29/2021)     ONETOUCH VERIO test strip  (Patient not taking: Reported on 06/29/2021)     SYMBICORT 80-4.5 MCG/ACT  inhaler Inhale 1 puff into the lungs 2 (two) times daily. (Patient not taking: Reported on 07/22/2020)  valsartan (DIOVAN) 320 MG tablet Take 320 mg by mouth daily. (Patient not taking: Reported on 06/29/2021)  3   No current facility-administered medications for this visit.    Review of Systems  Constitutional: Negative for chills, fatigue, fever and unexpected weight change.  HENT: Negative for trouble swallowing.  Eyes: Negative for loss of vision.  Respiratory: Negative for cough, shortness of breath and wheezing.  Cardiovascular: Negative for chest pain, leg swelling, palpitations and syncope.  GI: Negative for abdominal pain, blood in stool, diarrhea, nausea and vomiting.  GU: Negative for difficulty urinating, dysuria, frequency and hematuria.  Musculoskeletal: Negative for back pain, leg pain and joint pain.  Skin: Negative for rash.  Neurological: Negative for dizziness, headaches, light-headedness, numbness and seizures.  Psychiatric: Negative for behavioral problem, confusion, depressed mood and sleep disturbance.       Objective:  Objective   Vitals:   06/29/21 1544  BP: 132/70  Weight: 179 lb 3.2 oz (81.3 kg)  Height: 5\' 3"  (1.6 m)   Body mass index is 31.74 kg/m.  Physical Exam Vitals and nursing note reviewed. Exam conducted with a chaperone present.  Constitutional:      Appearance: Normal appearance. She is well-developed.  HENT:     Head: Normocephalic and atraumatic.  Eyes:     Extraocular Movements: Extraocular movements intact.     Pupils: Pupils are equal, round, and reactive to light.  Cardiovascular:     Rate and Rhythm: Normal rate and regular rhythm.  Pulmonary:     Effort: Pulmonary effort is normal. No respiratory distress.     Breath sounds: Normal breath sounds.  Abdominal:     General: Abdomen is flat.     Palpations: Abdomen is soft.  Genitourinary:    Comments: External: Normal appearing vulva. No lesions noted.  Speculum examination:  Normal appearing cervix. No blood in the vaginal vault. No  discharge.   Bimanual examination: Uterus midline, non-tender, normal in size, shape and contour.  No CMT. No adnexal masses. No adnexal tenderness. Pelvis not fixed.  Breast exam: exam not performed Musculoskeletal:        General: No signs of injury.  Skin:    General: Skin is warm and dry.  Neurological:     Mental Status: She is alert and oriented to person, place, and time.  Psychiatric:        Behavior: Behavior normal.        Thought Content: Thought content normal.        Judgment: Judgment normal.    Endometrial Biopsy After discussion with the patient regarding her abnormal uterine bleeding I recommended that she proceed with an endometrial biopsy for further diagnosis. The risks, benefits, alternatives, and indications for an endometrial biopsy were discussed with the patient in detail. She understood the risks including infection, bleeding, cervical laceration and uterine perforation.  Verbal consent was obtained.   PROCEDURE NOTE:  [place] endometrial biopsy was performed using aseptic technique with iodine preparation.  The uterus was sounded to a length of 8 cm.  Adequate sampling was obtained with minimal blood loss. Scant sampling. The patient tolerated the procedure well.  Disposition will be pending pathology.   Assessment/Plan:    76 yo with postmenopausal bleeding.  Endometrial biopsy performed today in office. Will follow-up plan will be pending pathology result. Given the scant sampling in the thick appearance of tissue will consider hysteroscopy D&C although medical and cardiac clearance would be needed.  More than 30 minutes were spent face  to face with the patient in the room, reviewing the medical record, labs and images, and coordinating care for the patient. The plan of management was discussed in detail and counseling was provided.       Adrian Prows MD [facility], [facility] 06/29/2021 4:22 PM

## 2021-07-01 LAB — SURGICAL PATHOLOGY

## 2021-07-03 DIAGNOSIS — R079 Chest pain, unspecified: Secondary | ICD-10-CM | POA: Diagnosis not present

## 2021-07-06 ENCOUNTER — Other Ambulatory Visit: Payer: Self-pay

## 2021-07-06 ENCOUNTER — Ambulatory Visit (INDEPENDENT_AMBULATORY_CARE_PROVIDER_SITE_OTHER): Payer: Commercial Managed Care - HMO | Admitting: Obstetrics and Gynecology

## 2021-07-06 VITALS — BP 122/70 | Ht 63.0 in | Wt 178.0 lb

## 2021-07-06 DIAGNOSIS — N950 Postmenopausal bleeding: Secondary | ICD-10-CM

## 2021-07-06 DIAGNOSIS — R9389 Abnormal findings on diagnostic imaging of other specified body structures: Secondary | ICD-10-CM

## 2021-07-06 NOTE — H&P (View-Only) (Signed)
Patient ID: [name], female   DOB: Oct 08, 1945, 76 y.o.   MRN: [id number]  Reason for Consult: Follow-up   Referred by Perrin Maltese, MD  Subjective:     HPI:  [name] is a 76 y.o. female she is here today for follow-up after her endometrial biopsy.  Endometrial biopsy result was normal.  However sampling is scant and patient does have a thickened endometrial complex.  For this reason recommended proceeding with hysteroscopy D&C.  Gynecological History  No LMP recorded. Patient is postmenopausal.  Past Medical History:  Diagnosis Date   Arthritis    Cerebral aneurysm    Chronic kidney disease    Complication of anesthesia    "take a long time to awaken "   Coronary artery disease    Diabetes mellitus    Headache(784.0)    Heart murmur    HOH (hard of hearing)    Has hearing aids, doesn't wear   Hypertension    Neck stiffness    screws in neck.  Some limitations of movement   Sleep apnea    does not use cpap   Systemic lupus erythematosus ([place])    Wears dentures    partial lower   Family History  Problem Relation Age of Onset   Breast cancer Paternal Aunt 72   Breast cancer Paternal 88    Heart disease Mother    Heart disease Father    Stroke Father    Diabetes Sister    Diabetes Brother    Past Surgical History:  Procedure Laterality Date   ANEURYSM COILING  3/15   BACK SURGERY     BARTHOLIN CYST MARSUPIALIZATION     BREAST CYST ASPIRATION Right 01/25/2012   FNA neg.   BREAST EXCISIONAL BIOPSY Left 06/26/2007   neg   CARDIAC CATHETERIZATION  08   stents   CEREBRAL ANEURYSM REPAIR  2008   A COM   CEREBRAL ANEURYSM REPAIR  2008   R MCA   CERVICAL SPINE SURGERY     COLONOSCOPY WITH PROPOFOL N/A 10/04/2019   Procedure: COLONOSCOPY WITH BIOPSY;  Surgeon: Lin Landsman, MD;  Location: [place];  Service: Endoscopy;  Laterality: N/A;  Diabetic (borderline) - oral meds priority 3   EYE SURGERY     cataract    POLYPECTOMY N/A 10/04/2019   Procedure: POLYPECTOMY;  Surgeon: Lin Landsman, MD;  Location: [place];  Service: Endoscopy;  Laterality: N/A;   UPPER ESOPHAGEAL ENDOSCOPIC ULTRASOUND (EUS) N/A 04/15/2016   Procedure: UPPER ESOPHAGEAL ENDOSCOPIC ULTRASOUND (EUS);  Surgeon: Holly Bodily, MD;  Location: [facility] ENDOSCOPY;  Service: Gastroenterology;  Laterality: N/A;    Short Social History:  Social History   Tobacco Use   Smoking status: Never   Smokeless tobacco: Never  Substance Use Topics   Alcohol use: Yes    Comment: Occasionally at christmas    Allergies  Allergen Reactions   Amlodipine Swelling   Sulfa Antibiotics Itching    Current Outpatient Medications  Medication Sig Dispense Refill   furosemide (LASIX) 40 MG tablet Take 40 mg by mouth daily.      gabapentin (NEURONTIN) 100 MG capsule TAKE 1 CAPSULE BY MOUTH EVERYDAY AT BEDTIME     gabapentin (NEURONTIN) 300 MG capsule Take 300 mg by mouth 2 (two) times daily as needed.     glimepiride (AMARYL) 2 MG tablet Take 3 mg by mouth daily.      hydrALAZINE (APRESOLINE) 50 MG tablet Take 25  mg by mouth 2 (two) times daily.     hydroxychloroquine (PLAQUENIL) 200 MG tablet TAKE 1 TABLET BY MOUTH daily     ibandronate (BONIVA) 150 MG tablet Take by mouth.     labetalol (NORMODYNE) 300 MG tablet Take 300 mg by mouth 2 (two) times daily.  2   losartan (COZAAR) 100 MG tablet Take 100 mg by mouth daily.     mometasone (NASONEX) 50 MCG/ACT nasal spray SPRAY 2 SPRAYS EACH NOSTRIL DAILY     olmesartan (BENICAR) 40 MG tablet Take 40 mg by mouth daily.     pantoprazole (PROTONIX) 40 MG tablet Take 40 mg by mouth daily.     rosuvastatin (CRESTOR) 5 MG tablet Take 5 mg by mouth at bedtime.  3   SYMBICORT 80-4.5 MCG/ACT inhaler Inhale 1 puff into the lungs 2 (two) times daily.     tretinoin (RETIN-A) 0.025 % cream APPLY TO THE AFFECTED AREA ONE A DAY     valsartan (DIOVAN) 320 MG tablet Take 320 mg by mouth daily.  3    zolpidem (AMBIEN) 5 MG tablet Take 5 mg by mouth at bedtime as needed. For sleep     allopurinol (ZYLOPRIM) 300 MG tablet Take 300 mg by mouth daily. (Patient not taking: Reported on 07/06/2021)     aspirin 81 MG chewable tablet Chew 81 mg by mouth daily. (Patient not taking: Reported on 07/06/2021)     atenolol (TENORMIN) 100 MG tablet Take 100 mg by mouth daily. (Patient not taking: Reported on 07/06/2021)     chlorhexidine (PERIDEX) 0.12 % solution Use as directed 15 mLs in the mouth or throat 2 (two) times daily as needed. (Patient not taking: Reported on 07/06/2021)     Cholecalciferol (VITAMIN D3) 10000 UNITS capsule Take 10,000 Units by mouth daily. (Patient not taking: Reported on 07/06/2021)     clopidogrel (PLAVIX) 75 MG tablet Take 75 mg by mouth daily. (Patient not taking: Reported on 07/06/2021)     cyclobenzaprine (FLEXERIL) 5 MG tablet Take 5 mg by mouth 3 (three) times daily as needed for muscle spasms. (Patient not taking: Reported on 07/06/2021)     fexofenadine (ALLEGRA) 180 MG tablet Take 180 mg by mouth daily as needed for allergies or rhinitis. (Patient not taking: Reported on 07/06/2021)     No current facility-administered medications for this visit.    Review of Systems  Constitutional: Negative for chills, fatigue, fever and unexpected weight change.  HENT: Negative for trouble swallowing.  Eyes: Negative for loss of vision.  Respiratory: Negative for cough, shortness of breath and wheezing.  Cardiovascular: Negative for chest pain, leg swelling, palpitations and syncope.  GI: Negative for abdominal pain, blood in stool, diarrhea, nausea and vomiting.  GU: Negative for difficulty urinating, dysuria, frequency and hematuria.  Musculoskeletal: Negative for back pain, leg pain and joint pain.  Skin: Negative for rash.  Neurological: Negative for dizziness, headaches, light-headedness, numbness and seizures.  Psychiatric: Negative for behavioral problem, confusion, depressed  mood and sleep disturbance.       Objective:  Objective   Vitals:   07/06/21 1314  BP: 122/70  Weight: 178 lb (80.7 kg)  Height: 5\' 3"  (1.6 m)   Body mass index is 31.53 kg/m.  Physical Exam Vitals and nursing note reviewed. Exam conducted with a chaperone present.  Constitutional:      Appearance: Normal appearance.  HENT:     Head: Normocephalic and atraumatic.  Eyes:     Extraocular Movements: Extraocular movements  intact.     Pupils: Pupils are equal, round, and reactive to light.  Cardiovascular:     Rate and Rhythm: Normal rate and regular rhythm.  Pulmonary:     Effort: Pulmonary effort is normal.     Breath sounds: Normal breath sounds.  Abdominal:     General: Abdomen is flat.     Palpations: Abdomen is soft.  Musculoskeletal:     Cervical back: Normal range of motion.  Skin:    General: Skin is warm and dry.  Neurological:     General: No focal deficit present.     Mental Status: She is alert and oriented to person, place, and time.  Psychiatric:        Behavior: Behavior normal.        Thought Content: Thought content normal.        Judgment: Judgment normal.    Assessment/Plan:     76 year old with thickened endometrium and postmenopausal bleeding.  We will plan hysteroscopy D&C for better sampling of the uterine cavity.  Discussed risk of bleeding infection damage to surrounding pelvic tissues.  Patient will need medical and cardiac clearance.  She reports that she is close follow-up planned with her cardiologist for an echo.  More than 10 minutes were spent face to face with the patient in the room, reviewing the medical record, labs and images, and coordinating care for the patient. The plan of management was discussed in detail and counseling was provided.      Adrian Prows MD [facility], [facility] 07/06/2021 1:19 PM

## 2021-07-06 NOTE — Progress Notes (Signed)
Patient ID: [name], female   DOB: 06-06-1946, 76 y.o.   MRN: [id number]  Reason for Consult: Follow-up   Referred by Perrin Maltese, MD  Subjective:     HPI:  [name] is a 76 y.o. female she is here today for follow-up after her endometrial biopsy.  Endometrial biopsy result was normal.  However sampling is scant and patient does have a thickened endometrial complex.  For this reason recommended proceeding with hysteroscopy D&C.  Gynecological History  No LMP recorded. Patient is postmenopausal.  Past Medical History:  Diagnosis Date   Arthritis    Cerebral aneurysm    Chronic kidney disease    Complication of anesthesia    "take a long time to awaken "   Coronary artery disease    Diabetes mellitus    Headache(784.0)    Heart murmur    HOH (hard of hearing)    Has hearing aids, doesn't wear   Hypertension    Neck stiffness    screws in neck.  Some limitations of movement   Sleep apnea    does not use cpap   Systemic lupus erythematosus ([place])    Wears dentures    partial lower   Family History  Problem Relation Age of Onset   Breast cancer Paternal Aunt 14   Breast cancer Paternal 14    Heart disease Mother    Heart disease Father    Stroke Father    Diabetes Sister    Diabetes Brother    Past Surgical History:  Procedure Laterality Date   ANEURYSM COILING  3/15   BACK SURGERY     BARTHOLIN CYST MARSUPIALIZATION     BREAST CYST ASPIRATION Right 01/25/2012   FNA neg.   BREAST EXCISIONAL BIOPSY Left 06/26/2007   neg   CARDIAC CATHETERIZATION  08   stents   CEREBRAL ANEURYSM REPAIR  2008   A COM   CEREBRAL ANEURYSM REPAIR  2008   R MCA   CERVICAL SPINE SURGERY     COLONOSCOPY WITH PROPOFOL N/A 10/04/2019   Procedure: COLONOSCOPY WITH BIOPSY;  Surgeon: Lin Landsman, MD;  Location: [facility];  Service: Endoscopy;  Laterality: N/A;  Diabetic (borderline) - oral meds priority 3   EYE SURGERY     cataract    POLYPECTOMY N/A 10/04/2019   Procedure: POLYPECTOMY;  Surgeon: Lin Landsman, MD;  Location: [place];  Service: Endoscopy;  Laterality: N/A;   UPPER ESOPHAGEAL ENDOSCOPIC ULTRASOUND (EUS) N/A 04/15/2016   Procedure: UPPER ESOPHAGEAL ENDOSCOPIC ULTRASOUND (EUS);  Surgeon: Holly Bodily, MD;  Location: [facility] ENDOSCOPY;  Service: Gastroenterology;  Laterality: N/A;    Short Social History:  Social History   Tobacco Use   Smoking status: Never   Smokeless tobacco: Never  Substance Use Topics   Alcohol use: Yes    Comment: Occasionally at christmas    Allergies  Allergen Reactions   Amlodipine Swelling   Sulfa Antibiotics Itching    Current Outpatient Medications  Medication Sig Dispense Refill   furosemide (LASIX) 40 MG tablet Take 40 mg by mouth daily.      gabapentin (NEURONTIN) 100 MG capsule TAKE 1 CAPSULE BY MOUTH EVERYDAY AT BEDTIME     gabapentin (NEURONTIN) 300 MG capsule Take 300 mg by mouth 2 (two) times daily as needed.     glimepiride (AMARYL) 2 MG tablet Take 3 mg by mouth daily.      hydrALAZINE (APRESOLINE) 50 MG tablet Take 25  mg by mouth 2 (two) times daily.     hydroxychloroquine (PLAQUENIL) 200 MG tablet TAKE 1 TABLET BY MOUTH daily     ibandronate (BONIVA) 150 MG tablet Take by mouth.     labetalol (NORMODYNE) 300 MG tablet Take 300 mg by mouth 2 (two) times daily.  2   losartan (COZAAR) 100 MG tablet Take 100 mg by mouth daily.     mometasone (NASONEX) 50 MCG/ACT nasal spray SPRAY 2 SPRAYS EACH NOSTRIL DAILY     olmesartan (BENICAR) 40 MG tablet Take 40 mg by mouth daily.     pantoprazole (PROTONIX) 40 MG tablet Take 40 mg by mouth daily.     rosuvastatin (CRESTOR) 5 MG tablet Take 5 mg by mouth at bedtime.  3   SYMBICORT 80-4.5 MCG/ACT inhaler Inhale 1 puff into the lungs 2 (two) times daily.     tretinoin (RETIN-A) 0.025 % cream APPLY TO THE AFFECTED AREA ONE A DAY     valsartan (DIOVAN) 320 MG tablet Take 320 mg by mouth daily.  3    zolpidem (AMBIEN) 5 MG tablet Take 5 mg by mouth at bedtime as needed. For sleep     allopurinol (ZYLOPRIM) 300 MG tablet Take 300 mg by mouth daily. (Patient not taking: Reported on 07/06/2021)     aspirin 81 MG chewable tablet Chew 81 mg by mouth daily. (Patient not taking: Reported on 07/06/2021)     atenolol (TENORMIN) 100 MG tablet Take 100 mg by mouth daily. (Patient not taking: Reported on 07/06/2021)     chlorhexidine (PERIDEX) 0.12 % solution Use as directed 15 mLs in the mouth or throat 2 (two) times daily as needed. (Patient not taking: Reported on 07/06/2021)     Cholecalciferol (VITAMIN D3) 10000 UNITS capsule Take 10,000 Units by mouth daily. (Patient not taking: Reported on 07/06/2021)     clopidogrel (PLAVIX) 75 MG tablet Take 75 mg by mouth daily. (Patient not taking: Reported on 07/06/2021)     cyclobenzaprine (FLEXERIL) 5 MG tablet Take 5 mg by mouth 3 (three) times daily as needed for muscle spasms. (Patient not taking: Reported on 07/06/2021)     fexofenadine (ALLEGRA) 180 MG tablet Take 180 mg by mouth daily as needed for allergies or rhinitis. (Patient not taking: Reported on 07/06/2021)     No current facility-administered medications for this visit.    Review of Systems  Constitutional: Negative for chills, fatigue, fever and unexpected weight change.  HENT: Negative for trouble swallowing.  Eyes: Negative for loss of vision.  Respiratory: Negative for cough, shortness of breath and wheezing.  Cardiovascular: Negative for chest pain, leg swelling, palpitations and syncope.  GI: Negative for abdominal pain, blood in stool, diarrhea, nausea and vomiting.  GU: Negative for difficulty urinating, dysuria, frequency and hematuria.  Musculoskeletal: Negative for back pain, leg pain and joint pain.  Skin: Negative for rash.  Neurological: Negative for dizziness, headaches, light-headedness, numbness and seizures.  Psychiatric: Negative for behavioral problem, confusion, depressed  mood and sleep disturbance.       Objective:  Objective   Vitals:   07/06/21 1314  BP: 122/70  Weight: 178 lb (80.7 kg)  Height: 5\' 3"  (1.6 m)   Body mass index is 31.53 kg/m.  Physical Exam Vitals and nursing note reviewed. Exam conducted with a chaperone present.  Constitutional:      Appearance: Normal appearance.  HENT:     Head: Normocephalic and atraumatic.  Eyes:     Extraocular Movements: Extraocular movements  intact.     Pupils: Pupils are equal, round, and reactive to light.  Cardiovascular:     Rate and Rhythm: Normal rate and regular rhythm.  Pulmonary:     Effort: Pulmonary effort is normal.     Breath sounds: Normal breath sounds.  Abdominal:     General: Abdomen is flat.     Palpations: Abdomen is soft.  Musculoskeletal:     Cervical back: Normal range of motion.  Skin:    General: Skin is warm and dry.  Neurological:     General: No focal deficit present.     Mental Status: She is alert and oriented to person, place, and time.  Psychiatric:        Behavior: Behavior normal.        Thought Content: Thought content normal.        Judgment: Judgment normal.    Assessment/Plan:     76 year old with thickened endometrium and postmenopausal bleeding.  We will plan hysteroscopy D&C for better sampling of the uterine cavity.  Discussed risk of bleeding infection damage to surrounding pelvic tissues.  Patient will need medical and cardiac clearance.  She reports that she is close follow-up planned with her cardiologist for an echo.  More than 10 minutes were spent face to face with the patient in the room, reviewing the medical record, labs and images, and coordinating care for the patient. The plan of management was discussed in detail and counseling was provided.      Adrian Prows MD [facility] 07/06/2021 1:19 PM

## 2021-07-07 DIAGNOSIS — I340 Nonrheumatic mitral (valve) insufficiency: Secondary | ICD-10-CM | POA: Diagnosis not present

## 2021-07-08 DIAGNOSIS — I2510 Atherosclerotic heart disease of native coronary artery without angina pectoris: Secondary | ICD-10-CM | POA: Diagnosis not present

## 2021-07-08 DIAGNOSIS — N184 Chronic kidney disease, stage 4 (severe): Secondary | ICD-10-CM | POA: Diagnosis not present

## 2021-07-08 DIAGNOSIS — R809 Proteinuria, unspecified: Secondary | ICD-10-CM | POA: Diagnosis not present

## 2021-07-08 DIAGNOSIS — D631 Anemia in chronic kidney disease: Secondary | ICD-10-CM | POA: Diagnosis not present

## 2021-07-08 DIAGNOSIS — E119 Type 2 diabetes mellitus without complications: Secondary | ICD-10-CM | POA: Diagnosis not present

## 2021-07-08 DIAGNOSIS — N2581 Secondary hyperparathyroidism of renal origin: Secondary | ICD-10-CM | POA: Diagnosis not present

## 2021-07-08 DIAGNOSIS — E785 Hyperlipidemia, unspecified: Secondary | ICD-10-CM | POA: Diagnosis not present

## 2021-07-08 DIAGNOSIS — N289 Disorder of kidney and ureter, unspecified: Secondary | ICD-10-CM | POA: Diagnosis not present

## 2021-07-08 DIAGNOSIS — N281 Cyst of kidney, acquired: Secondary | ICD-10-CM | POA: Diagnosis not present

## 2021-07-08 DIAGNOSIS — I10 Essential (primary) hypertension: Secondary | ICD-10-CM | POA: Diagnosis not present

## 2021-07-10 DIAGNOSIS — I340 Nonrheumatic mitral (valve) insufficiency: Secondary | ICD-10-CM | POA: Diagnosis not present

## 2021-07-10 DIAGNOSIS — R0602 Shortness of breath: Secondary | ICD-10-CM | POA: Diagnosis not present

## 2021-07-10 DIAGNOSIS — I739 Peripheral vascular disease, unspecified: Secondary | ICD-10-CM | POA: Diagnosis not present

## 2021-07-10 DIAGNOSIS — I2510 Atherosclerotic heart disease of native coronary artery without angina pectoris: Secondary | ICD-10-CM | POA: Diagnosis not present

## 2021-07-10 DIAGNOSIS — I10 Essential (primary) hypertension: Secondary | ICD-10-CM | POA: Diagnosis not present

## 2021-07-10 DIAGNOSIS — E782 Mixed hyperlipidemia: Secondary | ICD-10-CM | POA: Diagnosis not present

## 2021-07-10 DIAGNOSIS — I351 Nonrheumatic aortic (valve) insufficiency: Secondary | ICD-10-CM | POA: Diagnosis not present

## 2021-07-14 DIAGNOSIS — M2012 Hallux valgus (acquired), left foot: Secondary | ICD-10-CM | POA: Diagnosis not present

## 2021-07-14 DIAGNOSIS — M2011 Hallux valgus (acquired), right foot: Secondary | ICD-10-CM | POA: Diagnosis not present

## 2021-07-14 DIAGNOSIS — M19071 Primary osteoarthritis, right ankle and foot: Secondary | ICD-10-CM | POA: Diagnosis not present

## 2021-07-14 DIAGNOSIS — T8484XA Pain due to internal orthopedic prosthetic devices, implants and grafts, initial encounter: Secondary | ICD-10-CM | POA: Diagnosis not present

## 2021-07-14 DIAGNOSIS — M21611 Bunion of right foot: Secondary | ICD-10-CM | POA: Diagnosis not present

## 2021-07-14 DIAGNOSIS — E119 Type 2 diabetes mellitus without complications: Secondary | ICD-10-CM | POA: Diagnosis not present

## 2021-07-14 DIAGNOSIS — M21612 Bunion of left foot: Secondary | ICD-10-CM | POA: Diagnosis not present

## 2021-07-14 DIAGNOSIS — M19072 Primary osteoarthritis, left ankle and foot: Secondary | ICD-10-CM | POA: Diagnosis not present

## 2021-07-15 ENCOUNTER — Encounter: Payer: Self-pay | Admitting: Emergency Medicine

## 2021-07-15 ENCOUNTER — Emergency Department: Payer: Medicare Other

## 2021-07-15 ENCOUNTER — Emergency Department
Admission: EM | Admit: 2021-07-15 | Discharge: 2021-07-15 | Disposition: A | Discharge status: Home / Self Care | Payer: Medicare Other | Attending: Emergency Medicine | Admitting: Emergency Medicine

## 2021-07-15 ENCOUNTER — Other Ambulatory Visit: Payer: Self-pay

## 2021-07-15 ENCOUNTER — Telehealth: Payer: Self-pay

## 2021-07-15 DIAGNOSIS — R0609 Other forms of dyspnea: Secondary | ICD-10-CM | POA: Diagnosis not present

## 2021-07-15 DIAGNOSIS — G319 Degenerative disease of nervous system, unspecified: Secondary | ICD-10-CM | POA: Diagnosis not present

## 2021-07-15 DIAGNOSIS — I252 Old myocardial infarction: Secondary | ICD-10-CM | POA: Diagnosis not present

## 2021-07-15 DIAGNOSIS — G238 Other specified degenerative diseases of basal ganglia: Secondary | ICD-10-CM | POA: Diagnosis not present

## 2021-07-15 DIAGNOSIS — I6782 Cerebral ischemia: Secondary | ICD-10-CM | POA: Diagnosis not present

## 2021-07-15 DIAGNOSIS — R200 Anesthesia of skin: Secondary | ICD-10-CM | POA: Diagnosis not present

## 2021-07-15 DIAGNOSIS — G9389 Other specified disorders of brain: Secondary | ICD-10-CM | POA: Diagnosis not present

## 2021-07-15 DIAGNOSIS — R208 Other disturbances of skin sensation: Secondary | ICD-10-CM | POA: Insufficient documentation

## 2021-07-15 DIAGNOSIS — R0600 Dyspnea, unspecified: Secondary | ICD-10-CM | POA: Diagnosis not present

## 2021-07-15 DIAGNOSIS — R29818 Other symptoms and signs involving the nervous system: Secondary | ICD-10-CM | POA: Diagnosis not present

## 2021-07-15 DIAGNOSIS — R29810 Facial weakness: Secondary | ICD-10-CM | POA: Insufficient documentation

## 2021-07-15 LAB — CBC
HCT: 32.3 % — ABNORMAL LOW (ref 36.0–46.0)
Hemoglobin: 10.3 g/dL — ABNORMAL LOW (ref 12.0–15.0)
MCH: 30.7 pg (ref 26.0–34.0)
MCHC: 31.9 g/dL (ref 30.0–36.0)
MCV: 96.4 fL (ref 80.0–100.0)
Platelets: 137 10*3/uL — ABNORMAL LOW (ref 150–400)
RBC: 3.35 MIL/uL — ABNORMAL LOW (ref 3.87–5.11)
RDW: 13.3 % (ref 11.5–15.5)
WBC: 5.5 10*3/uL (ref 4.0–10.5)
nRBC: 0 % (ref 0.0–0.2)

## 2021-07-15 LAB — DIFFERENTIAL
Abs Immature Granulocytes: 0.01 10*3/uL (ref 0.00–0.07)
Basophils Absolute: 0 10*3/uL (ref 0.0–0.1)
Basophils Relative: 1 %
Eosinophils Absolute: 0.1 10*3/uL (ref 0.0–0.5)
Eosinophils Relative: 3 %
Immature Granulocytes: 0 %
Lymphocytes Relative: 26 %
Lymphs Abs: 1.4 10*3/uL (ref 0.7–4.0)
Monocytes Absolute: 0.2 10*3/uL (ref 0.1–1.0)
Monocytes Relative: 4 %
Neutro Abs: 3.6 10*3/uL (ref 1.7–7.7)
Neutrophils Relative %: 66 %

## 2021-07-15 LAB — COMPREHENSIVE METABOLIC PANEL
ALT: 11 U/L (ref 0–44)
AST: 13 U/L — ABNORMAL LOW (ref 15–41)
Albumin: 3.5 g/dL (ref 3.5–5.0)
Alkaline Phosphatase: 58 U/L (ref 38–126)
Anion gap: 7 (ref 5–15)
BUN: 44 mg/dL — ABNORMAL HIGH (ref 8–23)
CO2: 23 mmol/L (ref 22–32)
Calcium: 8.9 mg/dL (ref 8.9–10.3)
Chloride: 113 mmol/L — ABNORMAL HIGH (ref 98–111)
Creatinine, Ser: 2.43 mg/dL — ABNORMAL HIGH (ref 0.44–1.00)
GFR, Estimated: 20 mL/min — ABNORMAL LOW (ref >60)
Glucose, Bld: 238 mg/dL — ABNORMAL HIGH (ref 70–99)
Potassium: 4.8 mmol/L (ref 3.5–5.1)
Sodium: 143 mmol/L (ref 135–145)
Total Bilirubin: 0.9 mg/dL (ref 0.3–1.2)
Total Protein: 6 g/dL — ABNORMAL LOW (ref 6.5–8.1)

## 2021-07-15 LAB — BRAIN NATRIURETIC PEPTIDE: B Natriuretic Peptide: 41 pg/mL (ref 0.0–100.0)

## 2021-07-15 LAB — PROTIME-INR
INR: 1 (ref 0.8–1.2)
Prothrombin Time: 13 seconds (ref 11.4–15.2)

## 2021-07-15 LAB — TROPONIN I (HIGH SENSITIVITY)
Troponin I (High Sensitivity): 5 ng/L (ref <18)
Troponin I (High Sensitivity): 5 ng/L (ref <18)

## 2021-07-15 LAB — APTT: aPTT: 28 seconds (ref 24–36)

## 2021-07-15 NOTE — Telephone Encounter (Signed)
[facility]

## 2021-07-15 NOTE — Telephone Encounter (Signed)
Called patient to schedule hysteroscopy D&C w Gilman Schmidt  DOS 2/14  H&P n/a  Pre-admit phone call appointment to be requested. Also all appointments will be updated on pt MyChart. Explained that this appointment has a call window. Based on the time scheduled will indicate if the call will be received within a 4 hour window before 1:00 or after.  Advised that pt may also receive calls from the hospital pharmacy and pre-service center.  Confirmed pt has [facility] Medicare as primary insurance. Medicaid as secondary insurance.

## 2021-07-15 NOTE — ED Provider Notes (Addendum)
[facility] Provider Note    Event Date/Time   First MD Initiated Contact with Patient 07/15/21 1547     (approximate)   History   Numbness   HPI [name] is a 76 y.o. female with a stated past medical history of multiple coiled cerebral aneurysms as well as PCI after an MI who presents for a 3-hour episode of left-sided facial numbness as well as left lower extremity weakness that was causing difficulty with ambulation.  Patient states that the symptoms have resolved since onset but she still wanted to get checked out given her history of aneurysms.  Patient denies any similar symptoms with previous neurologic disease nor recurrence of the symptoms since they have resolved.  Patient also endorses dyspnea on exertion that is unrelated in timing to her neurologic symptoms.  Patient denies any chest pain or tightness with the shortness of breath on exertion     Physical Exam   Triage Vital Signs: ED Triage Vitals  Enc Vitals Group     BP 07/15/21 1442 (!) 154/70     Pulse Rate 07/15/21 1442 72     Resp 07/15/21 1442 16     Temp 07/15/21 1442 97.9 F (36.6 C)     Temp Source 07/15/21 1442 Oral     SpO2 07/15/21 1442 97 %     Weight 07/15/21 1443 178 lb (80.7 kg)     Height 07/15/21 1443 5\' 3"  (1.6 m)     Head Circumference --      Peak Flow --      Pain Score 07/15/21 1443 0     Pain Loc --      Pain Edu? --      Excl. in [place]? --     Most recent vital signs: Vitals:   07/15/21 1628 07/15/21 1700  BP: (!) 172/66 (!) 121/97  Pulse: 73 65  Resp: 20 (!) 21  Temp:    SpO2: 98% 100%    General: Awake, no distress.  CV:  Good peripheral perfusion.  Resp:  Normal effort.  Clear to auscultation bilaterally Abd:  No distention.  Other:  NIH stroke scale 0   ED Results / Procedures / Treatments   Labs (all labs ordered are listed, but only abnormal results are displayed) Labs Reviewed  CBC - Abnormal; Notable for the following  components:      Result Value   RBC 3.35 (*)    Hemoglobin 10.3 (*)    HCT 32.3 (*)    Platelets 137 (*)    All other components within normal limits  COMPREHENSIVE METABOLIC PANEL - Abnormal; Notable for the following components:   Chloride 113 (*)    Glucose, Bld 238 (*)    BUN 44 (*)    Creatinine, Ser 2.43 (*)    Total Protein 6.0 (*)    AST 13 (*)    GFR, Estimated 20 (*)    All other components within normal limits  PROTIME-INR  APTT  DIFFERENTIAL  BRAIN NATRIURETIC PEPTIDE  CBG MONITORING, ED  TROPONIN I (HIGH SENSITIVITY)  TROPONIN I (HIGH SENSITIVITY)     EKG ED ECG REPORT I, Naaman Plummer, the attending physician, personally viewed and interpreted this ECG.  Date: 07/15/2021 EKG Time: 1453 Rate: 67 Rhythm: Regular atrially paced rhythm QRS Axis: normal Intervals: normal ST/T Wave abnormalities: normal Narrative Interpretation: no evidence of acute ischemia   RADIOLOGY  ED MD interpretation: CT of the head without contrast shows  no evidence of acute abnormalities including no intracerebral hemorrhage, obvious masses, or significant edema  Single view portable chest x-ray shows no evidence of acute abnormalities including no pneumonia, pneumothorax, or widened mediastinum  MRI of the brain without contrast using neuro protocol shows no acute intracranial abnormalities.  It does show moderate chronic small vessel ischemic disease  Agree with radiology assessment  Official radiology report(s): CT HEAD WO CONTRAST  Result Date: 07/15/2021 CLINICAL DATA:  Neurological deficit EXAM: CT HEAD WITHOUT CONTRAST TECHNIQUE: Contiguous axial images were obtained from the base of the skull through the vertex without intravenous contrast. RADIATION DOSE REDUCTION: This exam was performed according to the departmental dose-optimization program which includes automated exposure control, adjustment of the mA and/or kV according to patient size and/or use of iterative  reconstruction technique. COMPARISON:  Head CT dated August 02, 2012 FINDINGS: Brain: Chronic white matter ischemic change. Bilateral basal ganglia calcifications. No evidence of acute infarction, hemorrhage, hydrocephalus, extra-axial collection or mass lesion/mass effect. Vascular: No hyperdense vessel or unexpected calcification. Skull: Normal. Negative for fracture or focal lesion. Sinuses/Orbits: Mucoceles of the bilateral maxillary sinuses. No acute finding. Other: None. IMPRESSION: No acute intracranial abnormality. Electronically Signed   By: Yetta Glassman M.D.   On: 07/15/2021 15:41   MR Brain Wo Contrast (neuro protocol)  Result Date: 07/15/2021 CLINICAL DATA:  Neuro deficit, acute, stroke suspected. Left-sided facial numbness and droop. EXAM: MRI HEAD WITHOUT CONTRAST TECHNIQUE: Multiplanar, multiecho pulse sequences of the brain and surrounding structures were obtained without intravenous contrast. COMPARISON:  Head CT 07/15/2021 and MRI 08/09/2014 FINDINGS: Brain: There is no evidence of an acute infarct, intracranial hemorrhage, mass, midline shift, or extra-axial fluid collection. Patchy T2 hyperintensities in the cerebral white matter bilaterally has slightly progressed from the prior MRI and are nonspecific but compatible with moderate chronic small vessel ischemic disease. 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 There may be a tiny chronic cortical infarct in the right parietal lobe. Mild cerebral atrophy is within normal limits for age. Vascular: Major intracranial vascular flow voids are preserved. Prior coiling of right MCA and anterior communicating region aneurysms. Skull and upper cervical spine: Unremarkable bone marrow signal. Sinuses/Orbits: Bilateral cataract extraction. Small mucous retention cysts in the maxillary sinuses. Clear mastoid air cells. Other: None. IMPRESSION: 1. No acute intracranial abnormality. 2. Moderate chronic small vessel  ischemic disease. Electronically Signed   By: Logan Bores M.D.   On: 07/15/2021 18:14   DG Chest Port 1 View  Result Date: 07/15/2021 CLINICAL DATA:  Dyspnea on exertion. EXAM: PORTABLE CHEST 1 VIEW COMPARISON:  August 20, 2016. FINDINGS: Stable cardiomediastinal silhouette. Stable elevated left hemidiaphragm. No acute pulmonary disease is noted. Bony thorax is unremarkable. IMPRESSION: No active disease. Electronically Signed   By: Marijo Conception M.D.   On: 07/15/2021 16:19      PROCEDURES:  Critical Care performed: No  .1-3 Lead EKG Interpretation Performed by: Naaman Plummer, MD Authorized by: Naaman Plummer, MD     Interpretation: abnormal     ECG rate:  65   ECG rate assessment: normal     Rhythm: paced     Ectopy: none     Conduction: normal   Comments:     Atrially paced   MEDICATIONS ORDERED IN ED: Medications - No data to display   IMPRESSION / MDM / [place] / ED COURSE  I reviewed the triage vital signs and the nursing notes.  Differential diagnosis includes, but is not limited to, intracranial hemorrhage, CVA, Bell's palsy, CHF, ACS  The patient is on the cardiac monitor to evaluate for evidence of arrhythmia and/or significant heart rate changes.  Patient is a 76 year old female who presents for multiple symptoms including left facial numbness, subjective left facial droop, and difficulty with ambulation as well as months of dyspnea on exertion that is currently being worked up as an outpatient.  Differential diagnosis as above.  Initial head CT did not show any evidence of acute abnormalities and patient's symptoms have resolved and therefore no thrombolytics indicated at this time.  Patient's MRI of the brain does not show any evidence of acute or subacute stroke as an explanation for her neurologic symptoms.  Patient's laboratory evaluation as well as EKG did not show any evidence of acute abnormalities with  high-sensitivity troponin negative x2.  Patient CBC significant for hemoglobin of 10.3 down from 11.11-year ago.  Patient denies any dark tarry stools, hematemesis, or vaginal bleeding.  Also shows slightly worsening renal function from baseline with creatinine of 2.43 today up from 2.06 1-year ago.  In discussion with patient, she is currently being worked up by her primary care physician for this renal function and has seen a nephrologist as well.  Patient also states that she has had extensive work-up with outpatient cardiology including a recent stress test this month that did not show any evidence of acute abnormalities.  Patient's BNP also normal at 41 and therefore does not show any evidence of acute congestive heart failure.  The patient has been reexamined and is ready to be discharged.  All diagnostic results have been reviewed and discussed with the patient/family.  Care plan has been outlined and the patient/family understands all current diagnoses, results, and treatment plans.  There are no new complaints, changes, or physical findings at this time.  All questions have been addressed and answered.    Patient was instructed to, and agrees to follow-up with their primary care physician as well as return to the emergency department if any new or worsening symptoms develop.       FINAL CLINICAL IMPRESSION(S) / ED DIAGNOSES   Final diagnoses:  Numbness  Dyspnea on exertion     Rx / DC Orders   ED Discharge Orders     None        Note:  This document was prepared using Dragon voice recognition software and may include unintentional dictation errors.   Naaman Plummer, MD 07/15/21 Jeananne Rama    Naaman Plummer, MD 07/15/21 [phone]

## 2021-07-15 NOTE — ED Triage Notes (Signed)
Pt comes into the ED via POV c/o left side facial numbness with mild droop.  Pt denies any other left side weakness problems, but admits her speech is a little different and so is her ambulation.  Pt is currently on plavix.  Pt states her LKW was last night when she went to bed.  Pt in NAD at this time with even and unlabored respirations.

## 2021-07-15 NOTE — ED Notes (Signed)
Pt discharge information reviewed. Pt understands need for follow up care and when to return if symptoms worsen. All questions answered. Pt is alert and oriented with even and regular respirations. Pt is seen ambulating out of department with strong steady gait.   °

## 2021-07-15 NOTE — Telephone Encounter (Signed)
-----   Message from [place] Lodge sent at 07/07/2021  4:57 PM EST ----- Regarding: FW: surgery scheduling  ----- Message ----- From: Homero Fellers, MD Sent: 07/06/2021   1:29 PM EST To: Alexandria Lodge, Loris Seelye H Dariush Mcnellis Subject: surgery scheduling                             Surgery Booking Request Patient Full Name:  [name]  MRN: [id number]  DOB: 02-19-1946  Surgeon: Homero Fellers, MD  Requested Surgery Date and Time: January preferred  Primary Diagnosis AND Code: thickened endometrium, postmenopausal bleeding Secondary Diagnosis and Code:  Surgical Procedure: Hysteroscopy D&C RNFA Requested?: No L&D Notification: No Admission Status: same day surgery Length of Surgery: 50 min Special Case Needs: No H&P: No Phone Interview???:  Yes Interpreter: No Medical Clearance:  Yes- Cardiac clearance  Special Scheduling Instructions: No Any known health/anesthesia issues, diabetes, sleep apnea, latex allergy, defibrillator/pacemaker?: Yes: Diabetes, CAD, lupus Acuity: P2   (P1 highest, P2 delay may cause harm, P3 low, elective gyn, P4 lowest) Post op follow up visits: 1 week post op

## 2021-07-15 NOTE — ED Notes (Signed)
Pt transported to MRI 

## 2021-07-16 DIAGNOSIS — I2510 Atherosclerotic heart disease of native coronary artery without angina pectoris: Secondary | ICD-10-CM | POA: Diagnosis not present

## 2021-07-16 DIAGNOSIS — E1122 Type 2 diabetes mellitus with diabetic chronic kidney disease: Secondary | ICD-10-CM | POA: Diagnosis not present

## 2021-07-16 DIAGNOSIS — M3210 Systemic lupus erythematosus, organ or system involvement unspecified: Secondary | ICD-10-CM | POA: Diagnosis not present

## 2021-07-16 DIAGNOSIS — I10 Essential (primary) hypertension: Secondary | ICD-10-CM | POA: Diagnosis not present

## 2021-07-16 DIAGNOSIS — E782 Mixed hyperlipidemia: Secondary | ICD-10-CM | POA: Diagnosis not present

## 2021-07-18 DIAGNOSIS — R202 Paresthesia of skin: Secondary | ICD-10-CM | POA: Diagnosis not present

## 2021-07-18 DIAGNOSIS — R9431 Abnormal electrocardiogram [ECG] [EKG]: Secondary | ICD-10-CM | POA: Diagnosis not present

## 2021-07-18 DIAGNOSIS — R0609 Other forms of dyspnea: Secondary | ICD-10-CM | POA: Diagnosis not present

## 2021-07-20 NOTE — Telephone Encounter (Signed)
Request for Medical Clearance was sent to Dr Humphrey Rolls at [facility].   DOS 08/04/21 - Hysteroscopy D&C  Clearance was rec'd with the following noted:  Risk assessment - LOW Patient is optimized for surgery.  Will have clearance scanned into her my chart.

## 2021-07-27 DIAGNOSIS — R079 Chest pain, unspecified: Secondary | ICD-10-CM | POA: Diagnosis not present

## 2021-07-27 DIAGNOSIS — I340 Nonrheumatic mitral (valve) insufficiency: Secondary | ICD-10-CM | POA: Diagnosis not present

## 2021-07-27 DIAGNOSIS — I739 Peripheral vascular disease, unspecified: Secondary | ICD-10-CM | POA: Diagnosis not present

## 2021-07-27 DIAGNOSIS — I10 Essential (primary) hypertension: Secondary | ICD-10-CM | POA: Diagnosis not present

## 2021-07-27 DIAGNOSIS — R0602 Shortness of breath: Secondary | ICD-10-CM | POA: Diagnosis not present

## 2021-07-27 DIAGNOSIS — I2510 Atherosclerotic heart disease of native coronary artery without angina pectoris: Secondary | ICD-10-CM | POA: Diagnosis not present

## 2021-07-27 DIAGNOSIS — I351 Nonrheumatic aortic (valve) insufficiency: Secondary | ICD-10-CM | POA: Diagnosis not present

## 2021-07-27 DIAGNOSIS — E782 Mixed hyperlipidemia: Secondary | ICD-10-CM | POA: Diagnosis not present

## 2021-07-28 ENCOUNTER — Encounter: Payer: Self-pay | Admitting: Obstetrics and Gynecology

## 2021-07-28 ENCOUNTER — Other Ambulatory Visit: Payer: Self-pay

## 2021-07-28 ENCOUNTER — Encounter
Admission: RE | Admit: 2021-07-28 | Discharge: 2021-07-28 | Disposition: A | Discharge status: Home / Self Care | Payer: Medicare Other | Source: Ambulatory Visit | Attending: Obstetrics and Gynecology | Admitting: Obstetrics and Gynecology

## 2021-07-28 HISTORY — DX: Cardiac arrhythmia, unspecified: I49.9

## 2021-07-28 NOTE — Patient Instructions (Signed)
Your procedure is scheduled on:08-04-21 Tuesday Report to the Registration Desk on the 1st floor of the [facility].Then proceed to the 2nd floor Surgery Desk in the [facility] To find out your arrival time, please call [phone] between 1PM - 3PM on:08-03-21 Monday  REMEMBER: Instructions that are not followed completely may result in serious medical risk, up to and including death; or upon the discretion of your surgeon and anesthesiologist your surgery may need to be rescheduled.  Do not eat food after midnight the night before surgery.  No gum chewing, lozengers or hard candies.  You may however, drink Water up to 2 hours before you are scheduled to arrive for your surgery. Do not drink anything within 2 hours of your scheduled arrival time.  Type 1 and Type 2 diabetics should only drink water.  TAKE THESE MEDICATIONS THE MORNING OF SURGERY WITH A SIP OF WATER: -allopurinol (ZYLOPRIM)  -hydrALAZINE (APRESOLINE)  -labetalol (NORMODYNE) -rosuvastatin (CRESTOR)   Call Dr Gennette Pac office today (07-28-21) about when you need to stop your clopidogrel (PLAVIX)  One week prior to surgery: Stop Anti-inflammatories (NSAIDS) such as Advil, Aleve, Ibuprofen, Motrin, Naproxen, Naprosyn and Aspirin based products such as Excedrin, Goodys Powder, BC Powder.You may however, take Tylenol if needed for pain up until the day of surgery.  Stop ANY OVER THE COUNTER supplements/vitamins NOW (07-28-21) until after surgery (VITAMIN C, BIOTIN, B-12, VITAMIN A, VITAMIN D)  No Alcohol for 24 hours before or after surgery.  No Smoking including e-cigarettes for 24 hours prior to surgery.  No chewable tobacco products for at least 6 hours prior to surgery.  No nicotine patches on the day of surgery.  Do not use any "recreational" drugs for at least a week prior to your surgery.  Please be advised that the combination of cocaine and anesthesia may have negative outcomes, up to and including death. If you  test positive for cocaine, your surgery will be cancelled.  On the morning of surgery brush your teeth with toothpaste and water, you may rinse your mouth with mouthwash if you wish. Do not swallow any toothpaste or mouthwash.  Do not wear jewelry, make-up, hairpins, clips or nail polish.  Do not wear lotions, powders, or perfumes.   Do not shave body from the neck down 48 hours prior to surgery just in case you cut yourself which could leave a site for infection.  Also, freshly shaved skin may become irritated if using the CHG soap.  Contact lenses, hearing aids and dentures may not be worn into surgery.  Do not bring valuables to the hospital. [facility] is not responsible for any missing/lost belongings or valuables.  Notify your doctor if there is any change in your medical condition (cold, fever, infection).  Wear comfortable clothing (specific to your surgery type) to the hospital.  After surgery, you can help prevent lung complications by doing breathing exercises.  Take deep breaths and cough every 1-2 hours. Your doctor may order a device called an Incentive Spirometer to help you take deep breaths. When coughing or sneezing, hold a pillow firmly against your incision with both hands. This is called splinting. Doing this helps protect your incision. It also decreases belly discomfort.  If you are being admitted to the hospital overnight, leave your suitcase in the car. After surgery it may be brought to your room.  If you are being discharged the day of surgery, you will not be allowed to drive home. You will need a responsible  adult (18 years or older) to drive you home and stay with you that night.   If you are taking public transportation, you will need to have a responsible adult (18 years or older) with you. Please confirm with your physician that it is acceptable to use public transportation.   Please call the [facility] at [phone] if you  have any questions about these instructions.  Surgery Visitation Policy:  Patients undergoing a surgery or procedure may have one family member or support person with them as long as that person is not COVID-19 positive or experiencing its symptoms.  That person may remain in the waiting area during the procedure and may rotate out with other people.  Inpatient Visitation:    Visiting hours are 7 a.m. to 8 p.m. Up to two visitors ages 16+ are allowed at one time in a patient room. The visitors may rotate out with other people during the day. Visitors must check out when they leave, or other visitors will not be allowed. One designated support person may remain overnight. The visitor must pass COVID-19 screenings, use hand sanitizer when entering and exiting the patients room and wear a mask at all times, including in the patients room. Patients must also wear a mask when staff or their visitor are in the room. Masking is required regardless of vaccination status.

## 2021-07-29 ENCOUNTER — Encounter
Admission: RE | Admit: 2021-07-29 | Discharge: 2021-07-29 | Disposition: A | Discharge status: Home / Self Care | Payer: Medicare Other | Source: Ambulatory Visit | Attending: Obstetrics and Gynecology | Admitting: Obstetrics and Gynecology

## 2021-07-29 ENCOUNTER — Encounter: Payer: Self-pay | Admitting: Obstetrics and Gynecology

## 2021-07-29 DIAGNOSIS — Z01812 Encounter for preprocedural laboratory examination: Secondary | ICD-10-CM | POA: Diagnosis not present

## 2021-07-29 DIAGNOSIS — R9389 Abnormal findings on diagnostic imaging of other specified body structures: Secondary | ICD-10-CM | POA: Diagnosis not present

## 2021-07-29 DIAGNOSIS — Z01818 Encounter for other preprocedural examination: Secondary | ICD-10-CM | POA: Diagnosis present

## 2021-07-29 LAB — CBC
HCT: 30.9 % — ABNORMAL LOW (ref 36.0–46.0)
Hemoglobin: 10.1 g/dL — ABNORMAL LOW (ref 12.0–15.0)
MCH: 30.6 pg (ref 26.0–34.0)
MCHC: 32.7 g/dL (ref 30.0–36.0)
MCV: 93.6 fL (ref 80.0–100.0)
Platelets: 195 10*3/uL (ref 150–400)
RBC: 3.3 MIL/uL — ABNORMAL LOW (ref 3.87–5.11)
RDW: 13.4 % (ref 11.5–15.5)
WBC: 5.1 10*3/uL (ref 4.0–10.5)
nRBC: 0 % (ref 0.0–0.2)

## 2021-07-29 LAB — TYPE AND SCREEN
ABO/RH(D): O POS
Antibody Screen: NEGATIVE

## 2021-07-29 NOTE — Progress Notes (Signed)
Perioperative Services  Pre-Admission/Anesthesia Testing Clinical Review  Date: 07/30/21  Patient Demographics:  Name: [name] DOB:   1945-12-04 MRN:   [id number]  Planned Surgical Procedure(s):    Case: [id number] Date/Time: 08/04/21 1219   Procedure: DILATATION AND CURETTAGE /HYSTEROSCOPY   Anesthesia type: Choice   Pre-op diagnosis:      thickened endometrium     postmenopausal bleeding   Location: [facility] OR ROOM 06 / [facility]   Surgeons: Homero Fellers, MD   NOTE: Available PAT nursing documentation and vital signs have been reviewed. Clinical nursing staff has updated patient's PMH/PSHx, current medication list, and drug allergies/intolerances to ensure comprehensive history available to assist in medical decision making as it pertains to the aforementioned surgical procedure and anticipated anesthetic course. Extensive review of available clinical information performed. [place] PMH and PSHx updated with any diagnoses/procedures that  may have been inadvertently omitted during her intake with the pre-admission testing department's nursing staff.  Clinical Discussion:  [name] is a 76 y.o. female who is submitted for pre-surgical anesthesia review and clearance prior to her undergoing the above procedure. Patient has never been a smoker. Pertinent PMH includes: CAD, cardiac murmur, RBBB, multiple cerebral aneurysms (s/p coiling), elevated LEFT hemidiaphragm, diastolic dysfunction, IRBBB, HTN, HLD, T2DM, hyperparathyroidism, CKD-IV (solitary functioning kidney), OSAH (does not use nocturnal PAP therapy), anemia of chronic renal disease, OA, RA (on DMARD), SLE.  Patient is followed by cardiology Humphrey Rolls, MD). She was last seen in the cardiology clinic on 07/27/2021 20; notes reviewed. At the time of her clinic visit, the patient denied any chest pain, shortness of breath, PND, orthopnea, palpitations, significant peripheral edema,  vertiginous symptoms, or presyncope/syncope.  Patient with a PMH significant for cardiovascular diagnoses.  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  ACOM aneurysms measured 6.2 x 4.5 mm and 4.9 x 4.2 mm.  RIGHT MCA aneurysm measured 7.0 x 4.5 mm.  Patient underwent endovascular obliteration of her complex ACOM aneurysms on 07/18/2006.  Patient underwent endovascular treatment of her RIGHT MCA aneurysm on 10/03/2006.  Patient noted to have 3.5 x 3.0 remnant of the RIGHT MCA trifurcation region aneurysm secondary to coil compaction on 09/14/2007.  Patient found to have 3.2 x 3.1 mm saccular outpouching in the previously treated ACOM aneurysm consistent with mild recannulization in the neck.  Patient underwent endovascular near complete obliteration of and enlarging neck aneurysm on 09/03/2010.  This was her previously treated RIGHT MCA aneurysm.  Procedure completed using stent assisted coiling.  Patient found to have a 3.7 x 2 mm neck remnant of a previously treated ACOM aneurysm on 01/28/2012.  She underwent attempted embolization procedure on 02/24/2012, however procedure was unsuccessful.  Patient underwent a diagnostic left heart catheterization on 02/23/2007 revealing normal left ventricular systolic function with an EF of 50%.  There was multivessel CAD; 30% proximal LAD and 70% mid RCA.  Staged PCI was planned for later date.  Patient ultimately underwent staged PCI procedure on 02/27/2007.  EF at that time was 60%.  Patient with an 80% stenosis of the mid RCA.  3.5 x 15 mm Vision BMS x1 placed with 0% residual stenosis and TIMI-3 flow.  Patient underwent subsequent cardiac catheterizations on 07/24/2007, 10/28/2010, and 05/31/2013 all showing moderate nonobstructive CAD with minimal ISR of previously placed stent to the mid RCA.  Cardiology has not been further interventions necessary  at this  point and are treating patient with aggressive medical management for further ASCVD prevention.  Coronary CTA performed on 05/20/2014 revealed a RIGHT dominant system. Coronary calcium score 51.8. LAD and LCx with mild disease proximal to the stent in the mid RCA.  Repeat coronary CTA performed on 05/22/2015 revealed a coronary calcium score 592.3.  Cardiologist noted there was no significant disease in the LAD or LCx.  There was mild disease proximal to the stent in the mid RCA, but no significant ISR.  Myocardial perfusion imaging study performed on 07/03/2021 revealed normal left ventricular ejection fraction of 65%  There was no evidence of stress-induced myocardial ischemia or arrhythmia.  Study determined to be normal and low risk.  TTE performed on 07/07/2021 revealed normal left ventricular systolic function with an EF of 87.8%.  Diastolic Doppler parameters consistent with impaired relaxation (G1DD).  PASP normal.  There was trace tricuspid and mitral valve regurgitation.  There was no evidence of a significant transvalvular gradient to suggest stenosis.  Patient with an older generation cardiac stent in place, therefore she is on daily antiplatelet therapy using clopidogrel; compliant with therapy with no evidence or reports of GI bleeding.  Blood pressure reasonably controlled at 130/74 on currently prescribed diuretic, vasodilator, beta-blocker, and ARB therapies.  Patient is on a statin for her HLD and further ASCVD prevention.  T2DM well-controlled on currently prescribed regimen; last Hgb A1c 6.9% when checked on 11/27/2020. Functional capacity, as defined by [facility]AS[facility], is documented as being >/= 4 METS.  No changes were made to her medication regimen.  Patient to follow-up with outpatient cardiology in 4 months or sooner if needed.  [name] is scheduled for an DILATATION AND CURETTAGE /HYSTEROSCOPY on 08/04/2021 with Dr. Adrian Prows, MD.  Given patient's past  medical history significant for cardiovascular diagnoses, presurgical cardiac clearance was sought by the PAT team. Per cardiology, "this patient is optimized for surgery and may proceed with the planned procedural course with a LOW risk of significant perioperative cardiovascular complications".  Again this patient is on daily antiplatelet therapy.  I have spoken with her surgeon personally and was advised that risk of bleeding with the planned procedure is felt to be minimal and therefore patient can continue her daily clopidogrel dose throughout the perioperative course; patient was made aware of recommendations.  Patient denies previous perioperative complications with anesthesia in the past. In review of the available records, it is noted that patient underwent a general anesthetic course at [facility] ([facility]) in 03/2021 without documented complications.  Of note, patient with cervical hardware in place from previous multilevel ACDF (C5-C7).  She notes some limited mobility of her cervical spine.  Copy of most recent imaging results below for review by the surgical/anesthetic team to assist with intubation planning.  Vitals with BMI 07/15/2021 07/15/2021 07/15/2021  Height - - -  Weight - - -  BMI - - -  Systolic 097 - 353  Diastolic 64 - 97  Pulse - 66 65    Providers/Specialists:   NOTE: Primary physician provider listed below. Patient may have been seen by APP or partner within same practice.   PROVIDER ROLE / SPECIALTY LAST Mertie Moores, MD OB/GYN (Surgeon) 07/06/2021  Perrin Maltese, MD Primary Care Provider ???  Neoma Laming, MD Cardiology 07/27/2021   Lyla Son, MD Nephrology 07/08/2021  Dorthula Matas, MD Rheumatology 06/17/2021   Allergies:  Amlodipine and Sulfa antibiotics  Current Home Medications:   No  current facility-administered medications for this encounter.    allopurinol (ZYLOPRIM) 100 MG tablet   Ascorbic Acid  (VITAMIN C PO)   BIOTIN PO   chlorhexidine (PERIDEX) 0.12 % solution   clopidogrel (PLAVIX) 75 MG tablet   clotrimazole-betamethasone (LOTRISONE) cream   Cyanocobalamin (B-12 PO)   furosemide (LASIX) 40 MG tablet   gabapentin (NEURONTIN) 100 MG capsule   glimepiride (AMARYL) 2 MG tablet   hydrALAZINE (APRESOLINE) 100 MG tablet   hydroxychloroquine (PLAQUENIL) 200 MG tablet   ibuprofen (ADVIL) 200 MG tablet   labetalol (NORMODYNE) 200 MG tablet   losartan (COZAAR) 100 MG tablet   rosuvastatin (CRESTOR) 40 MG tablet   SYMBICORT 80-4.5 MCG/ACT inhaler   tretinoin (RETIN-A) 0.025 % cream   VITAMIN A PO   VITAMIN D PO   zolpidem (AMBIEN) 5 MG tablet   History:   Past Medical History:  Diagnosis Date   Anemia of chronic renal disease    Arthritis    Cerebral aneurysm 06/07/2006   a.) 6.2x4.55mm and 4.9x4.66mm ACOM & 7x4.27mm RMCA aneur. b.)07/18/2006 -endovas oblit complex ACOM aneur. c.) Endovas Tx of 6.8x68mm RMCA aneur. d.) 3.5x48mm remnant RMCA aneur 2/2 coil compaction. e.) Interval 3.2x3.109mm saccular outpouching in ACOM c/w mild recannulization in neck. 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 g.) 3.7x24mm remnant of prev Tx'd ACOM aneur -failed embol 02/24/2012.   CKD (chronic kidney disease), stage IV (HCC)    a.) solitary functioning kidney on the RIGHT   Complication of anesthesia    a.) delayed emergence   Coronary artery disease 02/23/2007   a.) LHC 02/23/2007 --> EF 50%; 30% pLAD, 70% mRCA --> planned for staged PCI. b.) PCI 02/27/2007: EF 60%; 3.5 x 15 mm Vision BMS to 80% mRCA. b.) LHC 07/24/2007: EF 60%; minor irregs; no occlusive CAD; no intervention. c.) LHC 10/28/2010: EF 60%; 30% mLAD, LCx with minor luminal irregs, 20% ISR mRCA; no intervention. d.) LHC 05/31/2013: EF 60%; 40% mLAD, 30% ISR m-dRCA; no interventions.   Diastolic dysfunction    a.)  TTE 07/07/2021: EF 87.8%; normal PASP; trace TR/MR; G1DD.   Elevated hemidiaphragm    a.) LEFT   Headache(784.0)     Heart murmur    HLD (hyperlipidemia)    HOH (hard of hearing)    Has hearing aids, doesn't wear   Hyperparathyroidism due to renal insufficiency (HCC)    Hypertension    Incomplete right bundle branch block (RBBB)    Insomnia    a.) takes zolpidem   Long term (current) use of immunomodulator    a.) on DMARD therapy (hydroxychloriquine) for RA/SLE Dx.   Long term current use of antithrombotics/antiplatelets    a.) clopidogrel   Multiple acquired cysts of kidney    Neck stiffness    screws in neck.  Some limitations of movement   OSA (obstructive sleep apnea)    a.) does NOT use nocturnal pap therapy   Pancreatic cyst    RBBB (right bundle branch block)    Rheumatoid arthritis ([place])    a.) on DMARD; hydroxychloriquine   Systemic lupus erythematosus ([place])    T2DM (type 2 diabetes mellitus) ([place])    Wears dentures    partial lower   Past Surgical History:  Procedure Laterality Date   ANEURYSM COILING  08/2013   Attempted embolization of previously treated ACOM aneurysm; unsuccessful N/A 01/28/2012   Location: [place] CYST MARSUPIALIZATION     BREAST CYST  ASPIRATION Right 01/25/2012   FNA neg.   BREAST EXCISIONAL BIOPSY Left 06/26/2007   neg   BUNIONECTOMY Bilateral 2022   CARDIAC CATHETERIZATION Left 02/23/2007   Procedure: CARDIAC CATHETERIZATION; Location: [place]; Surgeon: Neoma Laming, MD   CARDIAC CATHETERIZATION Left 07/31/2007   Procedure: CARDIAC CATHETERIZATION; Location: [place]; Surgeon: Neoma Laming, MD   CARDIAC CATHETERIZATION Left 10/28/2010   Procedure: CARDIAC CATHETERIZATION; Location: [place]; Surgeon: Neoma Laming, MD   CARDIAC CATHETERIZATION Left 05/31/2013   Procedure: CARDIAC CATHETERIZATION; Location: [place]; Surgeon: Neoma Laming, MD   Carotid arteriogram; 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 N/A 07/18/2006   Location: [facility]   CATARACT EXTRACTION     CERVICAL  FUSION N/A    Procedure: ACDF C5-C7   COLONOSCOPY WITH PROPOFOL N/A 10/04/2019   Procedure: COLONOSCOPY WITH BIOPSY;  Surgeon: Lin Landsman, MD;  Location: [place];  Service: Endoscopy;  Laterality: N/A;  Diabetic (borderline) - oral meds priority 3   CORONARY STENT INTERVENTION Left 02/27/2007   Procedure: CORONARY STENT INTERVENTION (3.5 x 15 mm Vision BMS to mRCA); Location: [place]; Surgeon: Kathlyn Sacramento, MD   Endovascular near complete obliteration of enlarging neck remnant of previously treated RIGHT MCA aneurysm using stent assisted coiling N/A 09/03/2010   Location: [facility]   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 N/A 10/03/2006   Location: [facility]   POLYPECTOMY N/A 10/04/2019   Procedure: POLYPECTOMY;  Surgeon: Lin Landsman, MD;  Location: [place];  Service: Endoscopy;  Laterality: N/A;   UPPER ESOPHAGEAL ENDOSCOPIC ULTRASOUND (EUS) N/A 04/15/2016   Procedure: UPPER ESOPHAGEAL ENDOSCOPIC ULTRASOUND (EUS);  Surgeon: Holly Bodily, MD;  Location[facility] ENDOSCOPY;  Service: Gastroenterology;  Laterality: N/A;   Family History  Problem Relation Age of Onset   Breast cancer Paternal Aunt 13   Breast cancer Paternal 63    Heart disease Mother    Heart disease Father    Stroke Father    Diabetes Sister    Diabetes Brother    Social History   Tobacco Use   Smoking status: Never   Smokeless tobacco: Never  Vaping Use   Vaping Use: Never used  Substance Use Topics   Alcohol use: Yes    Comment: Occasionally at christmas   Drug use: No    Pertinent Clinical Results:  LABS: Labs reviewed: Acceptable for surgery.  Admission on 07/15/2021, Discharged on 07/15/2021  Component Date Value Ref Range Status   Prothrombin Time 07/15/2021 13.0  11.4 - 15.2 seconds Final   INR 07/15/2021 1.0  0.8 - 1.2 Final   Comment: (NOTE) INR goal varies based on device and disease  states. Performed at [facility], [address] [place], [place] [address]    aPTT 07/15/2021 28  24 - 36 seconds Final   Performed at [facility], [address] [place],  [address]   WBC 07/15/2021 5.5  4.0 - 10.5 K/uL Final   RBC 07/15/2021 3.35 (L)  3.87 - 5.11 MIL/uL Final   Hemoglobin 07/15/2021 10.3 (L)  12.0 - 15.0 g/dL Final   HCT 07/15/2021 32.3 (L)  36.0 - 46.0 % Final   MCV 07/15/2021 96.4  80.0 - 100.0 fL Final   MCH 07/15/2021 30.7  26.0 - 34.0 pg Final   MCHC 07/15/2021 31.9  30.0 - 36.0 g/dL Final   RDW 07/15/2021 13.3  11.5 - 15.5 % Final   Platelets 07/15/2021 137 (L)  150 - 400  K/uL Final   nRBC 07/15/2021 0.0  0.0 - 0.2 % Final   Performed at [facility], [address] [place], [place] [address]   Neutrophils Relative % 07/15/2021 66  % Final   Neutro Abs 07/15/2021 3.6  1.7 - 7.7 K/uL Final   Lymphocytes Relative 07/15/2021 26  % Final   Lymphs Abs 07/15/2021 1.4  0.7 - 4.0 K/uL Final   Monocytes Relative 07/15/2021 4  % Final   Monocytes Absolute 07/15/2021 0.2  0.1 - 1.0 K/uL Final   Eosinophils Relative 07/15/2021 3  % Final   Eosinophils Absolute 07/15/2021 0.1  0.0 - 0.5 K/uL Final   Basophils Relative 07/15/2021 1  % Final   Basophils Absolute 07/15/2021 0.0  0.0 - 0.1 K/uL Final   Immature Granulocytes 07/15/2021 0  % Final   Abs Immature Granulocytes 07/15/2021 0.01  0.00 - 0.07 K/uL Final   Performed at [facility], [address], [place] [address]   Sodium 07/15/2021 143  135 - 145 mmol/L Final   Potassium 07/15/2021 4.8  3.5 - 5.1 mmol/L Final   Chloride 07/15/2021 113 (H)  98 - 111 mmol/L Final   CO2 07/15/2021 23  22 - 32 mmol/L Final   Glucose, Bld 07/15/2021 238 (H)  70 - 99 mg/dL Final   Glucose reference range applies only to samples taken after fasting for at least 8 hours.   BUN 07/15/2021 44 (H)  8 - 23 mg/dL Final   Creatinine, Ser 07/15/2021 2.43 (H)  0.44 - 1.00 mg/dL  Final   Calcium 07/15/2021 8.9  8.9 - 10.3 mg/dL Final   Total Protein 07/15/2021 6.0 (L)  6.5 - 8.1 g/dL Final   Albumin 07/15/2021 3.5  3.5 - 5.0 g/dL Final   AST 07/15/2021 13 (L)  15 - 41 U/L Final   ALT 07/15/2021 11  0 - 44 U/L Final   Alkaline Phosphatase 07/15/2021 58  38 - 126 U/L Final   Total Bilirubin 07/15/2021 0.9  0.3 - 1.2 mg/dL Final   GFR, Estimated 07/15/2021 20 (L)  >60 mL/min Final   Comment: (NOTE) Calculated using the CKD-EPI Creatinine Equation (2021)    Anion gap 07/15/2021 7  5 - 15 Final   Performed at [facility], [place], [place] [address]   B Natriuretic Peptide 07/15/2021 41.0  0.0 - 100.0 pg/mL Final   Performed at [facility], [place], [place] [address]   Troponin I (High Sensitivity) 07/15/2021 5  <18 ng/L Final   Comment: (NOTE) Elevated high sensitivity troponin I (hsTnI) values and significant  changes across serial measurements may suggest ACS but many other  chronic and acute conditions are known to elevate hsTnI results.  Refer to the "Links" section for chest pain algorithms and additional  guidance. Performed at [facility], [address], [place] [address]    Troponin I (High Sensitivity) 07/15/2021 5  <18 ng/L Final   Comment: (NOTE) Elevated high sensitivity troponin I (hsTnI) values and significant  changes across serial measurements may suggest ACS but many other  chronic and acute conditions are known to elevate hsTnI results.  Refer to the "Links" section for chest pain algorithms and additional  guidance. Performed at [facility], [address] [place], [place] [address]     ECG: Date: 07/30/2021 Time ECG obtained: 1051 AM Rate: 75 bpm Rhythm:  Normal sinus rhythm; RBBB Axis (leads I and aVF):  Normal Intervals: PR 182 ms. QRS 156 ms. QTc 475 ms. ST segment and T wave changes: No evidence of acute ST segment elevation or  depression Comparison: Previous tracing showed atrial paced rhythm with an IVCD.  Confirm with patient that she has no implanted PPM.  Today's tracing consistent with ECG performed back on 08/20/2016.   IMAGING / PROCEDURES: MYOCARDIAL PERFUSION IMAGING STUDY (LEXISCAN) performed on 07/03/2021 LVEF 65% No significant ST changes with Persantine No evidence of stress-induced myocardial ischemia or arrhythmia Study determined to be normal and low risk  TRANSTHORACIC ECHOCARDIOGRAM performed on 07/07/2021 LVEF 87.8% Normal left ventricular systolic function with mild LVH Diastolic parameters consistent with impaired relaxation (G1DD) Normal right ventricular systolic/diastolic function Globally normal wall motion Trace TR and MR Normal PASP  MRI BRAIN WITHOUT CONTRAST performed on 07/15/2021 No acute intracranial abnormality Moderate chronic small vessel ischemic disease Major intracranial vascular flow voids are preserved Prior coiling of the RIGHT MCA and anterior communicating region aneurysms Evidence of previous BILATERAL cataract extraction Mild mucous retention cyst in the maxillary sinuses  DIAGNOSTIC RADIOGRAPHS OF CHEST performed on 07/15/2021 Stable cardiomediastinal silhouette Stable elevated LEFT hemidiaphragm No acute pulmonary disease Bony thorax is unremarkable  US PELVIC COMPLETE WITH TRANSVAGINAL performed on 05/27/2021 Endometrial thickness of 11 mm. In the setting of post-menopausal bleeding, endometrial sampling is indicated to exclude carcinoma. If results are benign, sonohysterogram should be considered for focal lesion work-up. Non visualized ovaries.  MRI CERVICAL SPINE WO CONTRAST performed on 01/12/2021 At T1-T2, moderate to severe canal stenosis, mass effect on the cord, and suspected faint cord edema at this level. Severe right and moderate left foraminal stenosis. Moderate to severe left foraminal stenosis at C2-C3 and C3-C4 Moderate left foraminal  stenosis C4-C5. C5-C7 ACDF without significant stenosis at these levels. Mild canal stenosis at C2-C3 and T2-T3  LEFT HEART CATHETERIZATION AND CORONARY ANGIOGRAPHY performed on 05/31/2013 LVEF 60% Multivessel CAD 40% stenosis of the mid LAD 30% stenosis with ISR of the mid to distal RCA Recommendations Further intervention deferred Aggressive medical management for risk factor reduction and ASCVD prevention  Impression and Plan:  [name] has been referred for pre-anesthesia review and clearance prior to her undergoing the planned anesthetic and procedural courses. Available labs, pertinent testing, and imaging results were personally reviewed by me. This patient has been appropriately cleared by cardiology with an overall LOW risk of significant perioperative cardiovascular complications.  Based on clinical review performed today (07/30/21), barring any significant acute changes in the patient's overall condition, it is anticipated that she will be able to proceed with the planned surgical intervention. Any acute changes in clinical condition may necessitate her procedure being postponed and/or cancelled. Patient will meet with anesthesia team (MD and/or CRNA) on the day of her procedure for preoperative evaluation/assessment. Questions regarding anesthetic course will be fielded at that time.   Pre-surgical instructions were reviewed with the patient during her PAT appointment and questions were fielded by PAT clinical staff. Patient was advised that if any questions or concerns arise prior to her procedure then she should return a call to PAT and/or her surgeon's office to discuss.  Honor Loh, MSN, APRN, FNP-C, CEN [facility]  Peri-operative Services Nurse Practitioner Phone: [phone] Fax: [phone] 07/30/21 9:14 AM  NOTE: This note has been prepared using Dragon dictation software. Despite my best ability to proofread, there is always the  potential that unintentional transcriptional errors may still occur from this process.

## 2021-07-30 ENCOUNTER — Encounter: Payer: Self-pay | Admitting: Obstetrics and Gynecology

## 2021-08-03 NOTE — Anesthesia Preprocedure Evaluation (Deleted)
Anesthesia Evaluation    Airway        Dental   Pulmonary sleep apnea ,           Cardiovascular hypertension, + CAD (s/p stents on Plavix)    ECG 07/29/21:  Normal sinus rhythm Right bundle branch block  Myocardial perfusion 07/03/21: Normal left ventricular ejection fraction of 65%.  There was no evidence of stress-induced myocardial ischemia or arrhythmia.  Study determined to be normal and low risk.  TTE 07/07/21: Normal left ventricular systolic function, EF of 16.5%.  Diastolic Doppler parameters consistent with impaired relaxation (G1DD).  PASP normal.  There was trace tricuspid and mitral valve regurgitation.  There was no evidence of a significant transvalvular gradient to suggest stenosis.   Neuro/Psych  Headaches, Cerebral aneurysm s/p coiling; HOH; s/p ACDF    GI/Hepatic   Endo/Other  diabetes, Type 2  Renal/GU Renal disease (solitary kidney; stage IV CKD)     Musculoskeletal  (+) Arthritis , Osteoarthritis and Rheumatoid disorders,  SLE; gout   Abdominal   Peds  Hematology  (+) Blood dyscrasia, anemia ,   Anesthesia Other Findings Reviewed and agree with Bayard Males pre-anesthesia clinical review note.  Reproductive/Obstetrics                             Anesthesia Physical Anesthesia Plan Anesthesia Quick Evaluation

## 2021-08-04 ENCOUNTER — Encounter
Admission: RE | Disposition: A | Discharge status: Home / Self Care | Payer: Self-pay | Source: Home / Self Care | Attending: Obstetrics and Gynecology

## 2021-08-04 ENCOUNTER — Other Ambulatory Visit: Payer: Self-pay

## 2021-08-04 ENCOUNTER — Ambulatory Visit: Payer: Medicare Other | Admitting: Urgent Care

## 2021-08-04 ENCOUNTER — Ambulatory Visit
Admission: RE | Admit: 2021-08-04 | Discharge: 2021-08-04 | Disposition: A | Discharge status: Home / Self Care | Payer: Medicare Other | Attending: Obstetrics and Gynecology | Admitting: Obstetrics and Gynecology

## 2021-08-04 ENCOUNTER — Encounter: Payer: Self-pay | Admitting: Obstetrics and Gynecology

## 2021-08-04 DIAGNOSIS — Z955 Presence of coronary angioplasty implant and graft: Secondary | ICD-10-CM | POA: Insufficient documentation

## 2021-08-04 DIAGNOSIS — R9389 Abnormal findings on diagnostic imaging of other specified body structures: Secondary | ICD-10-CM | POA: Diagnosis not present

## 2021-08-04 DIAGNOSIS — N840 Polyp of corpus uteri: Secondary | ICD-10-CM | POA: Diagnosis not present

## 2021-08-04 DIAGNOSIS — I739 Peripheral vascular disease, unspecified: Secondary | ICD-10-CM | POA: Insufficient documentation

## 2021-08-04 DIAGNOSIS — G4733 Obstructive sleep apnea (adult) (pediatric): Secondary | ICD-10-CM | POA: Diagnosis not present

## 2021-08-04 DIAGNOSIS — N950 Postmenopausal bleeding: Secondary | ICD-10-CM | POA: Insufficient documentation

## 2021-08-04 DIAGNOSIS — E1122 Type 2 diabetes mellitus with diabetic chronic kidney disease: Secondary | ICD-10-CM | POA: Diagnosis not present

## 2021-08-04 DIAGNOSIS — I2510 Atherosclerotic heart disease of native coronary artery without angina pectoris: Secondary | ICD-10-CM | POA: Diagnosis not present

## 2021-08-04 DIAGNOSIS — I129 Hypertensive chronic kidney disease with stage 1 through stage 4 chronic kidney disease, or unspecified chronic kidney disease: Secondary | ICD-10-CM | POA: Insufficient documentation

## 2021-08-04 DIAGNOSIS — N189 Chronic kidney disease, unspecified: Secondary | ICD-10-CM | POA: Insufficient documentation

## 2021-08-04 HISTORY — DX: Disorders of diaphragm: J98.6

## 2021-08-04 HISTORY — DX: Chronic kidney disease, stage 4 (severe): N18.4

## 2021-08-04 HISTORY — DX: Obstructive sleep apnea (adult) (pediatric): G47.33

## 2021-08-04 HISTORY — DX: Other ill-defined heart diseases: I51.89

## 2021-08-04 HISTORY — DX: Cyst of kidney, acquired: N28.1

## 2021-08-04 HISTORY — DX: Type 2 diabetes mellitus without complications: E11.9

## 2021-08-04 HISTORY — DX: Secondary hyperparathyroidism of renal origin: N25.81

## 2021-08-04 HISTORY — DX: Cyst of pancreas: K86.2

## 2021-08-04 HISTORY — PX: HYSTEROSCOPY WITH D & C: SHX1775

## 2021-08-04 HISTORY — DX: Long term (current) use of antithrombotics/antiplatelets: Z79.02

## 2021-08-04 HISTORY — DX: Unspecified right bundle-branch block: I45.10

## 2021-08-04 HISTORY — DX: Anemia in chronic kidney disease: D63.1

## 2021-08-04 HISTORY — DX: Anemia in chronic kidney disease: N18.9

## 2021-08-04 HISTORY — DX: Long term (current) use of immunomodulator: Z79.61

## 2021-08-04 HISTORY — DX: Hyperlipidemia, unspecified: E78.5

## 2021-08-04 HISTORY — DX: Insomnia, unspecified: G47.00

## 2021-08-04 HISTORY — DX: Rheumatoid arthritis, unspecified: M06.9

## 2021-08-04 LAB — GLUCOSE, CAPILLARY
Glucose-Capillary: 162 mg/dL — ABNORMAL HIGH (ref 70–99)
Glucose-Capillary: 142 mg/dL — ABNORMAL HIGH (ref 70–99)

## 2021-08-04 SURGERY — DILATATION AND CURETTAGE /HYSTEROSCOPY
Anesthesia: General

## 2021-08-04 MED ORDER — PROPOFOL 10 MG/ML IV BOLUS
INTRAVENOUS | Status: AC
  Filled 2021-08-04: qty 20

## 2021-08-04 MED ORDER — MIDAZOLAM HCL 2 MG/2ML IJ SOLN
INTRAMUSCULAR | Status: AC
  Filled 2021-08-04: qty 2

## 2021-08-04 MED ORDER — ONDANSETRON HCL 4 MG/2ML IJ SOLN
INTRAMUSCULAR | Status: AC
  Filled 2021-08-04: qty 2

## 2021-08-04 MED ORDER — LACTATED RINGERS IV SOLN: INTRAVENOUS | Status: DC

## 2021-08-04 MED ORDER — DEXAMETHASONE SODIUM PHOSPHATE 10 MG/ML IJ SOLN
INTRAMUSCULAR | Status: DC | PRN
  Administered 2021-08-04: 10 mg via INTRAVENOUS

## 2021-08-04 MED ORDER — DEXMEDETOMIDINE (PRECEDEX) IN NS 20 MCG/5ML (4 MCG/ML) IV SYRINGE
PREFILLED_SYRINGE | INTRAVENOUS | Status: AC
  Filled 2021-08-04: qty 5

## 2021-08-04 MED ORDER — PROPOFOL 500 MG/50ML IV EMUL
INTRAVENOUS | Status: AC
  Filled 2021-08-04: qty 50

## 2021-08-04 MED ORDER — POVIDONE-IODINE 10 % EX SWAB: 2.0000 "application " | Freq: Once | CUTANEOUS | Status: DC

## 2021-08-04 MED ORDER — OXYCODONE HCL 5 MG PO TABS: 5.0000 mg | ORAL_TABLET | Freq: Once | ORAL | Status: DC | PRN

## 2021-08-04 MED ORDER — FENTANYL CITRATE (PF) 100 MCG/2ML IJ SOLN
INTRAMUSCULAR | Status: AC
  Filled 2021-08-04: qty 2

## 2021-08-04 MED ORDER — DEXMEDETOMIDINE (PRECEDEX) IN NS 20 MCG/5ML (4 MCG/ML) IV SYRINGE
PREFILLED_SYRINGE | INTRAVENOUS | Status: DC | PRN
  Administered 2021-08-04: 8 ug via INTRAVENOUS

## 2021-08-04 MED ORDER — PROPOFOL 10 MG/ML IV BOLUS
INTRAVENOUS | Status: DC | PRN
Start: 2021-08-04 — End: 2021-08-04
  Administered 2021-08-04: 10 mg via INTRAVENOUS
  Administered 2021-08-04: 30 mg via INTRAVENOUS
  Administered 2021-08-04: 20 mg via INTRAVENOUS

## 2021-08-04 MED ORDER — FAMOTIDINE 20 MG PO TABS
20.0000 mg | ORAL_TABLET | Freq: Once | ORAL | Status: AC
Start: 2021-08-04 — End: 2021-08-04

## 2021-08-04 MED ORDER — LIDOCAINE HCL (PF) 2 % IJ SOLN
INTRAMUSCULAR | Status: AC
  Filled 2021-08-04: qty 5

## 2021-08-04 MED ORDER — SODIUM CHLORIDE 0.9 % IV SOLN: INTRAVENOUS | Status: DC

## 2021-08-04 MED ORDER — EPHEDRINE 5 MG/ML INJ
INTRAVENOUS | Status: AC
  Filled 2021-08-04: qty 5

## 2021-08-04 MED ORDER — PHENYLEPHRINE HCL (PRESSORS) 10 MG/ML IV SOLN
INTRAVENOUS | Status: DC | PRN
  Administered 2021-08-04 (×2): 80 ug via INTRAVENOUS

## 2021-08-04 MED ORDER — ONDANSETRON HCL 4 MG/2ML IJ SOLN: 4.0000 mg | Freq: Once | INTRAMUSCULAR | Status: DC | PRN

## 2021-08-04 MED ORDER — ORAL CARE MOUTH RINSE: 15.0000 mL | Freq: Once | OROMUCOSAL | Status: AC

## 2021-08-04 MED ORDER — FAMOTIDINE 20 MG PO TABS
ORAL_TABLET | ORAL | Status: AC
  Administered 2021-08-04: 20 mg via ORAL
  Filled 2021-08-04: qty 1

## 2021-08-04 MED ORDER — ACETAMINOPHEN 10 MG/ML IV SOLN: 1000.0000 mg | Freq: Once | INTRAVENOUS | Status: DC | PRN

## 2021-08-04 MED ORDER — PROPOFOL 500 MG/50ML IV EMUL
INTRAVENOUS | Status: DC | PRN
  Administered 2021-08-04: 100 ug/kg/min via INTRAVENOUS

## 2021-08-04 MED ORDER — FENTANYL CITRATE (PF) 100 MCG/2ML IJ SOLN
INTRAMUSCULAR | Status: DC | PRN
  Administered 2021-08-04 (×3): 25 ug via INTRAVENOUS

## 2021-08-04 MED ORDER — ACETAMINOPHEN 500 MG PO TABS: 1000.0000 mg | ORAL_TABLET | Freq: Four times a day (QID) | ORAL | 0 refills | Status: AC | PRN

## 2021-08-04 MED ORDER — EPHEDRINE SULFATE (PRESSORS) 50 MG/ML IJ SOLN
INTRAMUSCULAR | Status: DC | PRN
  Administered 2021-08-04: 5 mg via INTRAVENOUS
  Administered 2021-08-04: 10 mg via INTRAVENOUS

## 2021-08-04 MED ORDER — CHLORHEXIDINE GLUCONATE 0.12 % MT SOLN
OROMUCOSAL | Status: AC
  Administered 2021-08-04: 15 mL via OROMUCOSAL
  Filled 2021-08-04: qty 15

## 2021-08-04 MED ORDER — CHLORHEXIDINE GLUCONATE 0.12 % MT SOLN: 15.0000 mL | Freq: Once | OROMUCOSAL | Status: AC

## 2021-08-04 MED ORDER — 0.9 % SODIUM CHLORIDE (POUR BTL) OPTIME
TOPICAL | Status: DC | PRN
  Administered 2021-08-04: 500 mL

## 2021-08-04 MED ORDER — KETOROLAC TROMETHAMINE 30 MG/ML IJ SOLN
INTRAMUSCULAR | Status: AC
  Filled 2021-08-04: qty 1

## 2021-08-04 MED ORDER — SODIUM CHLORIDE 0.9 % IR SOLN
Status: DC | PRN
  Administered 2021-08-04: 3000 mL

## 2021-08-04 MED ORDER — FENTANYL CITRATE (PF) 100 MCG/2ML IJ SOLN: 25.0000 ug | INTRAMUSCULAR | Status: DC | PRN

## 2021-08-04 MED ORDER — OXYCODONE HCL 5 MG/5ML PO SOLN: 5.0000 mg | Freq: Once | ORAL | Status: DC | PRN

## 2021-08-04 MED ORDER — ONDANSETRON HCL 4 MG/2ML IJ SOLN
INTRAMUSCULAR | Status: DC | PRN
  Administered 2021-08-04: 4 mg via INTRAVENOUS

## 2021-08-04 SURGICAL SUPPLY — 20 items
BACTOSHIELD CHG 4% 4OZ (MISCELLANEOUS) ×1
DEVICE MYOSURE REACH (MISCELLANEOUS) ×1 IMPLANT
DRSG TELFA 3X8 NADH (GAUZE/BANDAGES/DRESSINGS) IMPLANT
GAUZE 4X4 16PLY ~~LOC~~+RFID DBL (SPONGE) ×5 IMPLANT
GLOVE SURG SYN 6.5 ES PF (GLOVE) ×2 IMPLANT
GLOVE SURG SYN 6.5 PF PI (GLOVE) ×2 IMPLANT
GLOVE SURG UNDER POLY LF SZ6.5 (GLOVE) ×6 IMPLANT
GOWN STRL REUS W/ TWL LRG LVL3 (GOWN DISPOSABLE) ×4 IMPLANT
GOWN STRL REUS W/TWL LRG LVL3 (GOWN DISPOSABLE) ×4
IV NS IRRIG 3000ML ARTHROMATIC (IV SOLUTION) ×3 IMPLANT
KIT PROCEDURE FLUENT (KITS) ×1 IMPLANT
MANIFOLD NEPTUNE II (INSTRUMENTS) ×3 IMPLANT
NS IRRIG 500ML POUR BTL (IV SOLUTION) ×1 IMPLANT
PACK DNC HYST (MISCELLANEOUS) ×3 IMPLANT
PAD DRESSING TELFA 3X8 NADH (GAUZE/BANDAGES/DRESSINGS) IMPLANT
PAD OB MATERNITY 4.3X12.25 (PERSONAL CARE ITEMS) ×3 IMPLANT
PAD PREP 24X41 OB/GYN DISP (PERSONAL CARE ITEMS) ×3 IMPLANT
SCRUB CHG 4% DYNA-HEX 4OZ (MISCELLANEOUS) ×2 IMPLANT
SEAL ROD LENS SCOPE MYOSURE (ABLATOR) ×3 IMPLANT
TOWEL OR 17X26 4PK STRL BLUE (TOWEL DISPOSABLE) ×3 IMPLANT

## 2021-08-04 NOTE — Interval H&P Note (Signed)
History and Physical Interval Note:  08/04/2021 11:31 AM  [name]  has presented today for surgery, with the diagnosis of thickened endometrium postmenapausal bleeding.  The various methods of treatment have been discussed with the patient and family. After consideration of risks, benefits and other options for treatment, the patient has consented to  Procedure(s): DILATATION AND CURETTAGE /HYSTEROSCOPY (N/A) as a surgical intervention.  The patient's history has been reviewed, patient examined, no change in status, stable for surgery.  I have reviewed the patient's chart and labs.  Questions were answered to the patient's satisfaction.     [place]

## 2021-08-04 NOTE — Anesthesia Postprocedure Evaluation (Signed)
Anesthesia Post Note  Patient: [name]  Procedure(s) Performed: DILATATION AND CURETTAGE /HYSTEROSCOPY  Patient location during evaluation: PACU Anesthesia Type: General Level of consciousness: awake and alert Pain management: pain level controlled Vital Signs Assessment: post-procedure vital signs reviewed and stable Respiratory status: spontaneous breathing, nonlabored ventilation, respiratory function stable and patient connected to nasal cannula oxygen Cardiovascular status: blood pressure returned to baseline and stable Postop Assessment: no apparent nausea or vomiting Anesthetic complications: no   No notable events documented.   Last Vitals:  Vitals:   08/04/21 1330 08/04/21 1339  BP: 139/79 129/66  Pulse: 63 62  Resp: 20 16  Temp: (!) 36.2 C (!) 35.7 C  SpO2: 96% 96%    Last Pain:  Vitals:   08/04/21 1339  TempSrc: Temporal  PainSc: 0-No pain                 Martha Clan

## 2021-08-04 NOTE — Discharge Instructions (Signed)

## 2021-08-04 NOTE — Op Note (Signed)
Operative Note  08/04/2021  PRE-OP DIAGNOSIS: Thickened endometrium  POST-OP DIAGNOSIS: same   SURGEON: Areonna Bran MD  PROCEDURE: Procedure(s): DILATATION AND CURETTAGE /HYSTEROSCOPY   ANESTHESIA: Choice   ESTIMATED BLOOD LOSS: 5 cc   SPECIMENS:  ENDOMETRIAL CURETTING  FLUID DEFICIT: minimal  COMPLICATIONS: None  DISPOSITION: PACU - hemodynamically stable.  CONDITION: stable  FINDINGS: Exam under anesthesia revealed  9 cm uterus with bilateral adnexa without masses or fullness. Hysteroscopy revealed a normally shaped uterine cavity with bilateral tubal ostia and normal appearing endocervical canal. Thickened endometrium noted along the posterior aspect of the uterus, directly sampled with the Myosure.  PROCEDURE IN DETAIL: After informed consent was obtained, the patient was taken to the operating room where anesthesia was obtained without difficulty. The patient was positioned in the dorsal lithotomy position in [place]. The patient's bladder was catheterized with an in and out foley catheter. The patient was examined under anesthesia, with the above noted findings. The weightedspeculum was placed inside the patient's vagina, and the the anterior lip of the cervix was seen and grasped with the tenaculum.  The uterine cavity was sounded to 9cm, and then the cervix was progressively dilated to a 18 French-Pratt dilator. The 0 degree hysteroscope was introduced, with saline fluid used to distend the intrauterine cavity, with the above noted findings.  The Myosure was used to remove the thickened endometrial tissue. Once the cavity was sampled entirely the hysteroscope was removed.   The uterine cavity was curetted until a gritty texture was noted, yielding endometrial curettings. Excellent hemostasis was noted, and all instruments were removed, with excellent hemostasis noted throughout. She was then taken out of dorsal lithotomy. Minimal discrepancy in fluid was  noted.  The patient tolerated the procedure well. Sponge, lap and needle counts were correct x2. The patient was taken to recovery room in excellent condition.  Adrian Prows MD [facility], [facility] 08/04/2021 1:12 PM

## 2021-08-04 NOTE — Anesthesia Preprocedure Evaluation (Signed)
Anesthesia Evaluation  Patient identified by MRN, date of birth, ID band Patient awake    Reviewed: Allergy & Precautions, H&P , NPO status , Patient's Chart, lab work & pertinent test results  History of Anesthesia Complications (+) PROLONGED EMERGENCE and history of anesthetic complications  Airway Mallampati: III  TM Distance: <3 FB Neck ROM: limited    Dental  (+) Poor Dentition, Caps, Dental Advidsory Given, Implants   Pulmonary neg shortness of breath, sleep apnea , neg COPD, neg recent URI,    Pulmonary exam normal breath sounds clear to auscultation       Cardiovascular Exercise Tolerance: Good hypertension, (-) angina+ CAD, + Cardiac Stents and + Peripheral Vascular Disease  (-) Past MI Normal cardiovascular exam+ dysrhythmias (RBBB) + Valvular Problems/Murmurs  Rhythm:regular Rate:Normal     Neuro/Psych  Headaches, neg Seizures negative psych ROS   GI/Hepatic negative GI ROS, Neg liver ROS,   Endo/Other  diabetes, Type 2  Renal/GU CRFRenal disease  negative genitourinary   Musculoskeletal  (+) Arthritis ,   Abdominal   Peds  Hematology negative hematology ROS (+)   Anesthesia Other Findings Past Medical History: No date: Anemia of chronic renal disease No date: Arthritis 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               b.)07/18/2006 -endovas oblit complex ACOM aneur. c.)               Endovas Tx of 6.8x55mm RMCA aneur. d.) 3.5x70mm remnant               RMCA aneur 2/2 coil compaction. e.) Interval 3.2x3.8mm               saccular outpouching in ACOM c/w mild recannulization in               neck. 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               g.) 3.7x80mm remnant of prev Tx'd ACOM aneur -failed embol              02/24/2012. No date: CKD (chronic kidney disease), stage IV (HCC)     Comment:  a.) solitary functioning kidney  on the RIGHT No date: Complication of anesthesia     Comment:  a.) delayed emergence 02/23/2007: Coronary artery disease     Comment:  a.) LHC 02/23/2007 --> EF 50%; 30% pLAD, 70% mRCA -->               planned for staged PCI. b.) PCI 02/27/2007: EF 60%; 3.5 x              15 mm Vision BMS to 80% mRCA. b.) LHC 07/24/2007: EF 60%;              minor irregs; no occlusive CAD; no intervention. c.) LHC               10/28/2010: EF 60%; 30% mLAD, LCx with minor luminal               irregs, 20% ISR mRCA; no intervention. d.) LHC               05/31/2013: EF 60%; 40% mLAD, 30% ISR m-dRCA; no               interventions. No date: Diastolic dysfunction     Comment:  a.)  TTE  07/07/2021: EF 87.8%; normal PASP; trace TR/MR;              G1DD. No date: Elevated hemidiaphragm     Comment:  a.) LEFT No date: Headache(784.0) No date: Heart murmur No date: HLD (hyperlipidemia) No date: HOH (hard of hearing)     Comment:  Has hearing aids, doesn't wear No date: Hyperparathyroidism due to renal insufficiency ([place]) No date: Hypertension No date: Incomplete right bundle branch block (RBBB) No date: Insomnia     Comment:  a.) takes zolpidem No date: Long term (current) use of immunomodulator     Comment:  a.) on DMARD therapy (hydroxychloriquine) for RA/SLE Dx. No date: Long term current use of antithrombotics/antiplatelets     Comment:  a.) clopidogrel No date: Multiple acquired cysts of kidney No date: Neck stiffness     Comment:  screws in neck.  Some limitations of movement No date: OSA (obstructive sleep apnea)     Comment:  a.) does NOT use nocturnal pap therapy No date: Pancreatic cyst No date: RBBB (right bundle branch block) No date: Rheumatoid arthritis ([place])     Comment:  a.) on DMARD; hydroxychloriquine No date: Systemic lupus erythematosus ([place]) No date: T2DM (type 2 diabetes mellitus) ([place]) No date: Wears dentures     Comment:  partial lower   Reproductive/Obstetrics negative  OB ROS                            Anesthesia Physical  Anesthesia Plan  ASA: 3  Anesthesia Plan: General   Post-op Pain Management:    Induction: Intravenous  PONV Risk Score and Plan: 3 and TIVA and Propofol infusion  Airway Management Planned: Natural Airway  Additional Equipment:   Intra-op Plan:   Post-operative Plan:   Informed Consent: I have reviewed the patients History and Physical, chart, labs and discussed the procedure including the risks, benefits and alternatives for the proposed anesthesia with the patient or authorized representative who has indicated his/her understanding and acceptance.       Plan Discussed with: Anesthesiologist, CRNA and Surgeon  Anesthesia Plan Comments:        Anesthesia Quick Evaluation

## 2021-08-04 NOTE — Transfer of Care (Signed)
Immediate Anesthesia Transfer of Care Note  Patient: Ladana N Lorino  Procedure(s) Performed: DILATATION AND CURETTAGE /HYSTEROSCOPY  Patient Location: PACU  Anesthesia Type:General  Level of Consciousness: awake, alert  and oriented  Airway & Oxygen Therapy: Patient Spontanous Breathing and Patient connected to face mask oxygen  Post-op Assessment: Report given to RN and Post -op Vital signs reviewed and stable  Post vital signs: Reviewed and stable  Last Vitals:  Vitals Value Taken Time  BP 117/61 08/04/21 1312  Temp    Pulse 65 08/04/21 1314  Resp 13 08/04/21 1314  SpO2 100 % 08/04/21 1314  Vitals shown include unvalidated device data.  Last Pain:  Vitals:   08/04/21 1146  TempSrc: Temporal  PainSc: 0-No pain         Complications: No notable events documented.

## 2021-08-05 ENCOUNTER — Encounter: Payer: Self-pay | Admitting: Obstetrics and Gynecology

## 2021-08-05 LAB — SURGICAL PATHOLOGY

## 2021-08-13 DIAGNOSIS — T8484XA Pain due to internal orthopedic prosthetic devices, implants and grafts, initial encounter: Secondary | ICD-10-CM | POA: Diagnosis not present

## 2021-08-17 ENCOUNTER — Encounter: Payer: Self-pay | Admitting: Obstetrics and Gynecology

## 2021-08-17 ENCOUNTER — Other Ambulatory Visit: Payer: Self-pay

## 2021-08-17 ENCOUNTER — Ambulatory Visit (INDEPENDENT_AMBULATORY_CARE_PROVIDER_SITE_OTHER): Payer: Medicaid Other | Admitting: Obstetrics and Gynecology

## 2021-08-17 VITALS — BP 120/72 | Ht 63.0 in | Wt 182.0 lb

## 2021-08-17 DIAGNOSIS — Z4889 Encounter for other specified surgical aftercare: Secondary | ICD-10-CM

## 2021-08-17 DIAGNOSIS — Z1231 Encounter for screening mammogram for malignant neoplasm of breast: Secondary | ICD-10-CM

## 2021-08-17 DIAGNOSIS — M545 Low back pain, unspecified: Secondary | ICD-10-CM | POA: Diagnosis not present

## 2021-08-17 DIAGNOSIS — G8929 Other chronic pain: Secondary | ICD-10-CM | POA: Diagnosis not present

## 2021-08-17 NOTE — Progress Notes (Signed)
°  Postoperative Follow-up Patient presents post op from  Hysteroscopy with Myosure Polypectomy  for  thickened endometrium , 1 week ago.  Subjective: She reports that since the surgery she has been feeling well. She has no complaints.  She would like a breast exam today. Eating a regular diet without difficulty.  The patient is not having any pain.   Activity: normal activities of daily living.   Objective: BP 120/72    Ht 5\' 3"  (1.6 m)    Wt 182 lb (82.6 kg)    BMI 32.24 kg/m  Physical Exam Constitutional:      Appearance: She is well-developed.  Genitourinary:     Genitourinary Comments:  Breast Exam: breast equal without skin changes, nipple discharge, breast lump or enlarged lymph nodes   HENT:     Head: Normocephalic and atraumatic.  Neck:     Thyroid: No thyromegaly.  Cardiovascular:     Rate and Rhythm: Normal rate and regular rhythm.     Heart sounds: Normal heart sounds.  Pulmonary:     Effort: Pulmonary effort is normal.     Breath sounds: Normal breath sounds.  Abdominal:     General: Bowel sounds are normal. There is no distension.     Palpations: Abdomen is soft. There is no mass.  Musculoskeletal:     Cervical back: Neck supple.  Neurological:     Mental Status: She is alert and oriented to person, place, and time.  Skin:    General: Skin is warm and dry.  Psychiatric:        Behavior: Behavior normal.        Thought Content: Thought content normal.        Judgment: Judgment normal.  Vitals reviewed.    Assessment: s/p :  Hysteroscopy with Myosure Polypectomy - stable Plan:  Patient has done well after surgery with no apparent complications.   I have discussed the post-operative course to date, and the expected progress moving forward.  The patient understands what complications to be concerned about.  I will see the patient in routine follow up, or sooner if needed.    Activity plan: No restriction.  Mammogram ordered today.     Adrian Prows MD, [place], [facility] 08/17/2021 10:44 AM

## 2021-08-18 DIAGNOSIS — R0602 Shortness of breath: Secondary | ICD-10-CM | POA: Diagnosis not present

## 2021-08-18 DIAGNOSIS — R911 Solitary pulmonary nodule: Secondary | ICD-10-CM | POA: Diagnosis not present

## 2021-08-19 ENCOUNTER — Other Ambulatory Visit: Payer: Self-pay | Admitting: Pulmonary Disease

## 2021-08-19 DIAGNOSIS — R911 Solitary pulmonary nodule: Secondary | ICD-10-CM

## 2021-08-19 DIAGNOSIS — R0602 Shortness of breath: Secondary | ICD-10-CM

## 2021-08-19 DIAGNOSIS — M2012 Hallux valgus (acquired), left foot: Secondary | ICD-10-CM | POA: Diagnosis not present

## 2021-08-19 DIAGNOSIS — T8484XA Pain due to internal orthopedic prosthetic devices, implants and grafts, initial encounter: Secondary | ICD-10-CM | POA: Diagnosis not present

## 2021-08-19 DIAGNOSIS — M25572 Pain in left ankle and joints of left foot: Secondary | ICD-10-CM | POA: Diagnosis not present

## 2021-08-19 DIAGNOSIS — M2011 Hallux valgus (acquired), right foot: Secondary | ICD-10-CM | POA: Diagnosis not present

## 2021-08-19 DIAGNOSIS — M25571 Pain in right ankle and joints of right foot: Secondary | ICD-10-CM | POA: Diagnosis not present

## 2021-08-31 DIAGNOSIS — M328 Other forms of systemic lupus erythematosus: Secondary | ICD-10-CM | POA: Diagnosis not present

## 2021-08-31 DIAGNOSIS — I10 Essential (primary) hypertension: Secondary | ICD-10-CM | POA: Diagnosis not present

## 2021-08-31 DIAGNOSIS — E782 Mixed hyperlipidemia: Secondary | ICD-10-CM | POA: Diagnosis not present

## 2021-08-31 DIAGNOSIS — I2510 Atherosclerotic heart disease of native coronary artery without angina pectoris: Secondary | ICD-10-CM | POA: Diagnosis not present

## 2021-08-31 DIAGNOSIS — E1122 Type 2 diabetes mellitus with diabetic chronic kidney disease: Secondary | ICD-10-CM | POA: Diagnosis not present

## 2021-09-01 DIAGNOSIS — T8484XA Pain due to internal orthopedic prosthetic devices, implants and grafts, initial encounter: Secondary | ICD-10-CM | POA: Diagnosis not present

## 2021-09-02 DIAGNOSIS — I10 Essential (primary) hypertension: Secondary | ICD-10-CM | POA: Diagnosis not present

## 2021-09-02 DIAGNOSIS — E1122 Type 2 diabetes mellitus with diabetic chronic kidney disease: Secondary | ICD-10-CM | POA: Diagnosis not present

## 2021-09-02 DIAGNOSIS — E782 Mixed hyperlipidemia: Secondary | ICD-10-CM | POA: Diagnosis not present

## 2021-09-02 DIAGNOSIS — I2510 Atherosclerotic heart disease of native coronary artery without angina pectoris: Secondary | ICD-10-CM | POA: Diagnosis not present

## 2021-09-07 ENCOUNTER — Other Ambulatory Visit: Payer: Self-pay

## 2021-09-07 ENCOUNTER — Ambulatory Visit
Admission: RE | Admit: 2021-09-07 | Discharge: 2021-09-07 | Disposition: A | Discharge status: Home / Self Care | Payer: Medicare Other | Source: Ambulatory Visit | Attending: Pulmonary Disease | Admitting: Pulmonary Disease

## 2021-09-07 DIAGNOSIS — R918 Other nonspecific abnormal finding of lung field: Secondary | ICD-10-CM | POA: Diagnosis not present

## 2021-09-07 DIAGNOSIS — R911 Solitary pulmonary nodule: Secondary | ICD-10-CM | POA: Diagnosis not present

## 2021-09-07 DIAGNOSIS — R0602 Shortness of breath: Secondary | ICD-10-CM | POA: Insufficient documentation

## 2021-09-08 DIAGNOSIS — M25552 Pain in left hip: Secondary | ICD-10-CM | POA: Diagnosis not present

## 2021-09-15 DIAGNOSIS — R0602 Shortness of breath: Secondary | ICD-10-CM | POA: Diagnosis not present

## 2021-09-15 DIAGNOSIS — N63 Unspecified lump in unspecified breast: Secondary | ICD-10-CM | POA: Diagnosis not present

## 2021-09-15 DIAGNOSIS — R911 Solitary pulmonary nodule: Secondary | ICD-10-CM | POA: Diagnosis not present

## 2021-09-16 DIAGNOSIS — G8929 Other chronic pain: Secondary | ICD-10-CM | POA: Diagnosis not present

## 2021-09-16 DIAGNOSIS — M7062 Trochanteric bursitis, left hip: Secondary | ICD-10-CM | POA: Diagnosis not present

## 2021-09-16 DIAGNOSIS — M1990 Unspecified osteoarthritis, unspecified site: Secondary | ICD-10-CM | POA: Diagnosis not present

## 2021-09-16 DIAGNOSIS — M545 Low back pain, unspecified: Secondary | ICD-10-CM | POA: Diagnosis not present

## 2021-09-18 DIAGNOSIS — I129 Hypertensive chronic kidney disease with stage 1 through stage 4 chronic kidney disease, or unspecified chronic kidney disease: Secondary | ICD-10-CM | POA: Diagnosis not present

## 2021-09-18 DIAGNOSIS — E1122 Type 2 diabetes mellitus with diabetic chronic kidney disease: Secondary | ICD-10-CM | POA: Diagnosis not present

## 2021-09-18 DIAGNOSIS — N184 Chronic kidney disease, stage 4 (severe): Secondary | ICD-10-CM | POA: Diagnosis not present

## 2021-09-21 DIAGNOSIS — E782 Mixed hyperlipidemia: Secondary | ICD-10-CM | POA: Diagnosis not present

## 2021-09-21 DIAGNOSIS — I10 Essential (primary) hypertension: Secondary | ICD-10-CM | POA: Diagnosis not present

## 2021-09-21 DIAGNOSIS — I340 Nonrheumatic mitral (valve) insufficiency: Secondary | ICD-10-CM | POA: Diagnosis not present

## 2021-09-21 DIAGNOSIS — I2510 Atherosclerotic heart disease of native coronary artery without angina pectoris: Secondary | ICD-10-CM | POA: Diagnosis not present

## 2021-09-21 DIAGNOSIS — I739 Peripheral vascular disease, unspecified: Secondary | ICD-10-CM | POA: Diagnosis not present

## 2021-09-21 DIAGNOSIS — I351 Nonrheumatic aortic (valve) insufficiency: Secondary | ICD-10-CM | POA: Diagnosis not present

## 2021-09-23 ENCOUNTER — Other Ambulatory Visit: Payer: Self-pay | Admitting: Physical Medicine & Rehabilitation

## 2021-09-23 DIAGNOSIS — G8929 Other chronic pain: Secondary | ICD-10-CM | POA: Diagnosis not present

## 2021-09-23 DIAGNOSIS — M5441 Lumbago with sciatica, right side: Secondary | ICD-10-CM | POA: Diagnosis not present

## 2021-09-23 DIAGNOSIS — M5442 Lumbago with sciatica, left side: Secondary | ICD-10-CM | POA: Diagnosis not present

## 2021-09-25 ENCOUNTER — Ambulatory Visit
Admission: RE | Admit: 2021-09-25 | Discharge: 2021-09-25 | Disposition: A | Discharge status: Home / Self Care | Payer: Medicare Other | Source: Ambulatory Visit | Attending: Obstetrics and Gynecology | Admitting: Obstetrics and Gynecology

## 2021-09-25 DIAGNOSIS — Z1231 Encounter for screening mammogram for malignant neoplasm of breast: Secondary | ICD-10-CM | POA: Diagnosis not present

## 2021-09-30 DIAGNOSIS — M4314 Spondylolisthesis, thoracic region: Secondary | ICD-10-CM | POA: Diagnosis not present

## 2021-09-30 DIAGNOSIS — M4714 Other spondylosis with myelopathy, thoracic region: Secondary | ICD-10-CM | POA: Diagnosis not present

## 2021-10-02 ENCOUNTER — Ambulatory Visit
Admission: RE | Admit: 2021-10-02 | Discharge: 2021-10-02 | Disposition: A | Discharge status: Home / Self Care | Payer: Medicare Other | Source: Ambulatory Visit | Attending: Physical Medicine & Rehabilitation | Admitting: Physical Medicine & Rehabilitation

## 2021-10-02 DIAGNOSIS — G8929 Other chronic pain: Secondary | ICD-10-CM | POA: Diagnosis not present

## 2021-10-02 DIAGNOSIS — M5126 Other intervertebral disc displacement, lumbar region: Secondary | ICD-10-CM | POA: Diagnosis not present

## 2021-10-02 DIAGNOSIS — M5441 Lumbago with sciatica, right side: Secondary | ICD-10-CM | POA: Insufficient documentation

## 2021-10-02 DIAGNOSIS — M5442 Lumbago with sciatica, left side: Secondary | ICD-10-CM | POA: Insufficient documentation

## 2021-10-02 DIAGNOSIS — M48061 Spinal stenosis, lumbar region without neurogenic claudication: Secondary | ICD-10-CM | POA: Diagnosis not present

## 2021-10-02 DIAGNOSIS — M47816 Spondylosis without myelopathy or radiculopathy, lumbar region: Secondary | ICD-10-CM | POA: Diagnosis not present

## 2021-10-04 ENCOUNTER — Emergency Department: Payer: Medicare Other

## 2021-10-04 ENCOUNTER — Emergency Department
Admission: EM | Admit: 2021-10-04 | Discharge: 2021-10-04 | Disposition: A | Discharge status: Home / Self Care | Payer: Medicare Other | Attending: Emergency Medicine | Admitting: Emergency Medicine

## 2021-10-04 DIAGNOSIS — S322XXA Fracture of coccyx, initial encounter for closed fracture: Secondary | ICD-10-CM | POA: Diagnosis not present

## 2021-10-04 DIAGNOSIS — Y93E1 Activity, personal bathing and showering: Secondary | ICD-10-CM | POA: Diagnosis not present

## 2021-10-04 DIAGNOSIS — S63502A Unspecified sprain of left wrist, initial encounter: Secondary | ICD-10-CM | POA: Insufficient documentation

## 2021-10-04 DIAGNOSIS — W182XXA Fall in (into) shower or empty bathtub, initial encounter: Secondary | ICD-10-CM | POA: Insufficient documentation

## 2021-10-04 DIAGNOSIS — S42132A Displaced fracture of coracoid process, left shoulder, initial encounter for closed fracture: Secondary | ICD-10-CM | POA: Insufficient documentation

## 2021-10-04 DIAGNOSIS — S43402A Unspecified sprain of left shoulder joint, initial encounter: Secondary | ICD-10-CM | POA: Diagnosis not present

## 2021-10-04 DIAGNOSIS — S3992XA Unspecified injury of lower back, initial encounter: Secondary | ICD-10-CM

## 2021-10-04 DIAGNOSIS — I2510 Atherosclerotic heart disease of native coronary artery without angina pectoris: Secondary | ICD-10-CM | POA: Diagnosis not present

## 2021-10-04 DIAGNOSIS — I129 Hypertensive chronic kidney disease with stage 1 through stage 4 chronic kidney disease, or unspecified chronic kidney disease: Secondary | ICD-10-CM | POA: Diagnosis not present

## 2021-10-04 DIAGNOSIS — S4992XA Unspecified injury of left shoulder and upper arm, initial encounter: Secondary | ICD-10-CM | POA: Diagnosis present

## 2021-10-04 DIAGNOSIS — M533 Sacrococcygeal disorders, not elsewhere classified: Secondary | ICD-10-CM | POA: Diagnosis not present

## 2021-10-04 DIAGNOSIS — E1122 Type 2 diabetes mellitus with diabetic chronic kidney disease: Secondary | ICD-10-CM | POA: Diagnosis not present

## 2021-10-04 DIAGNOSIS — M25532 Pain in left wrist: Secondary | ICD-10-CM | POA: Diagnosis not present

## 2021-10-04 DIAGNOSIS — N189 Chronic kidney disease, unspecified: Secondary | ICD-10-CM | POA: Insufficient documentation

## 2021-10-04 MED ORDER — NAPROXEN 500 MG PO TABS
500.0000 mg | ORAL_TABLET | Freq: Once | ORAL | Status: AC
  Administered 2021-10-04: 500 mg via ORAL
  Filled 2021-10-04: qty 1

## 2021-10-04 MED ORDER — TRAMADOL HCL 50 MG PO TABS: 50.0000 mg | ORAL_TABLET | Freq: Four times a day (QID) | ORAL | 0 refills | Status: DC | PRN

## 2021-10-04 NOTE — ED Provider Notes (Signed)
? ?[facility] ?Provider Note ? ? ? Event Date/Time  ? First MD Initiated Contact with Patient 10/04/21 2000   ?  (approximate) ? ? ?History  ? ?Fall ? ? ?HPI ? ?[name] is a 76 y.o. female with history of hypertension, CAD, CKD, type 2 diabetes, rheumatoid arthritis and as listed in EMR presents to the emergency department for evaluation of left shoulder, left wrist, and tailbone pain after she slipped in the shower and fell directly on her bottom.  She had reached out with her left hand to try and catch herself and now has left wrist and shoulder pain.  She denies striking her head or loss of consciousness.  She denies neck pain..  She is not currently on Plavix. ? ?  ? ? ?Physical Exam  ? ?Triage Vital Signs: ?ED Triage Vitals  ?Enc Vitals Group  ?   BP 10/04/21 1959 (!) 164/81  ?   Pulse Rate 10/04/21 1959 68  ?   Resp 10/04/21 1959 18  ?   Temp 10/04/21 1959 98.1 ?F (36.7 ?C)  ?   Temp Source 10/04/21 1959 Oral  ?   SpO2 10/04/21 1959 98 %  ?   Weight 10/04/21 1955 175 lb (79.4 kg)  ?   Height 10/04/21 1955 '5\' 3"'$  (1.6 m)  ?   Head Circumference --   ?   Peak Flow --   ?   Pain Score 10/04/21 1954 6  ?   Pain Loc --   ?   Pain Edu? --   ?   Excl. in [place]? --   ? ? ?Most recent vital signs: ?Vitals:  ? 10/04/21 2006 10/04/21 2029  ?BP: (!) 152/108   ?Pulse: 99 (!) 107  ?Resp: 18   ?Temp: 98.5 ?F (36.9 ?C) 98.6 ?F (37 ?C)  ?SpO2: 98% 93%  ? ? ?General: Awake, no distress.  ?CV:  Good peripheral perfusion.  ?Resp:  Normal effort.  ?Abd:  No distention.  ?Other:  Diffuse left shoulder pain without obvious step-off or deformity.  Swelling noted over the dorsal aspect of the left wrist and she has pain with attempt with range of motion.  Motor and sensory function of the fingers of the left hand is intact.  No tenderness over the lumbar spine.  Focal tenderness over the coccyx. ? ? ?ED Results / Procedures / Treatments  ? ?Labs ?(all labs ordered are listed, but only abnormal results  are displayed) ?Labs Reviewed - No data to display ? ? ?EKG ? ?Not indicated ? ? ?RADIOLOGY ? ?Image and radiology report reviewed by me. ? ?Left shoulder image positive for coracoid fracture. ?Left wrist negative for acute bony abnormality. ?Sacrum and coccyx negative for acute displaced fracture. ?Mildly displaced anatomically aligned fracture of the mid body of the coracoid process ? ?PROCEDURES: ? ?Critical Care performed: No ? ?Procedures ? ? ?MEDICATIONS ORDERED IN ED: ?Medications  ?naproxen (NAPROSYN) tablet 500 mg (has no administration in time range)  ? ? ? ?IMPRESSION / MDM / ASSESSMENT AND PLAN / ED COURSE  ? ?I have reviewed the triage note. ? ?Differential diagnosis includes, but is not limited to shoulder dislocation, proximal humerus fracture, wrist fracture, wrist sprain, coccyx injury, coccyx fracture ? ?76 year old female presenting to the emergency department for mechanical, nonsyncopal fall just prior to arrival.  See HPI for further details. ? ?Plan will be to get imaging of the left shoulder, wrist, and coccyx. ? ?Image of the left wrist  negative for acute bony abnormality.  Image of the left shoulder shows concern for a coracoid fracture.  Image of the sacrum and coccyx is negative for displaced fracture. ? ?Results of the shoulder images discussed with Dr. Leim Fabry who is on-call for orthopedics.  He would like to have a CT of the shoulder but will follow-up with her in the clinic as an outpatient.  CT ordered. ? ?Mildly displaced coracoid fracture of the left scapula. Sling ordered. Patient advised to call Dr. Serita Grit office in the morning to request an appointment. Patient states that she is going on a trip on the 17 and will be gone several weeks. She was advised of the importance of follow up. ?  ? ? ?FINAL CLINICAL IMPRESSION(S) / ED DIAGNOSES  ? ?Final diagnoses:  ?Wrist sprain, left, initial encounter  ?Injury of coccyx, initial encounter  ?Closed displaced fracture of coracoid  process of left shoulder, initial encounter  ? ? ? ?Rx / DC Orders  ? ?ED Discharge Orders   ? ?      Ordered  ?  traMADol (ULTRAM) 50 MG tablet  Every 6 hours PRN       ? 10/04/21 2227  ? ?  ?  ? ?  ? ? ? ?Note:  This document was prepared using Dragon voice recognition software and may include unintentional dictation errors. ?  ?Victorino Dike, FNP ?10/04/21 2230 ? ?  ?Naaman Plummer, MD ?10/09/21 1034 ? ?

## 2021-10-04 NOTE — Discharge Instructions (Addendum)
Please call Dr. Serita Grit office tomorrow for an appointment. ? ?

## 2021-10-04 NOTE — ED Triage Notes (Signed)
Pt slipped in shower hurting L shoulder. Endorsing buttock pain, L wrist pain and L shoulder pain at this time. Pt able to move all extremities without incident.  ?

## 2021-10-06 DIAGNOSIS — G8929 Other chronic pain: Secondary | ICD-10-CM | POA: Diagnosis not present

## 2021-10-06 DIAGNOSIS — M25532 Pain in left wrist: Secondary | ICD-10-CM | POA: Diagnosis not present

## 2021-10-06 DIAGNOSIS — M5441 Lumbago with sciatica, right side: Secondary | ICD-10-CM | POA: Diagnosis not present

## 2021-10-06 DIAGNOSIS — M25512 Pain in left shoulder: Secondary | ICD-10-CM | POA: Diagnosis not present

## 2021-10-06 DIAGNOSIS — M48062 Spinal stenosis, lumbar region with neurogenic claudication: Secondary | ICD-10-CM | POA: Diagnosis not present

## 2021-10-06 DIAGNOSIS — M5442 Lumbago with sciatica, left side: Secondary | ICD-10-CM | POA: Diagnosis not present

## 2021-12-01 DIAGNOSIS — E1122 Type 2 diabetes mellitus with diabetic chronic kidney disease: Secondary | ICD-10-CM | POA: Diagnosis not present

## 2021-12-01 DIAGNOSIS — E782 Mixed hyperlipidemia: Secondary | ICD-10-CM | POA: Diagnosis not present

## 2021-12-01 DIAGNOSIS — M7502 Adhesive capsulitis of left shoulder: Secondary | ICD-10-CM | POA: Diagnosis not present

## 2021-12-01 DIAGNOSIS — M109 Gout, unspecified: Secondary | ICD-10-CM | POA: Diagnosis not present

## 2021-12-01 DIAGNOSIS — Z0183 Encounter for blood typing: Secondary | ICD-10-CM | POA: Diagnosis not present

## 2021-12-01 DIAGNOSIS — M329 Systemic lupus erythematosus, unspecified: Secondary | ICD-10-CM | POA: Diagnosis not present

## 2021-12-01 DIAGNOSIS — I10 Essential (primary) hypertension: Secondary | ICD-10-CM | POA: Diagnosis not present

## 2021-12-02 DIAGNOSIS — E782 Mixed hyperlipidemia: Secondary | ICD-10-CM | POA: Diagnosis not present

## 2021-12-02 DIAGNOSIS — I10 Essential (primary) hypertension: Secondary | ICD-10-CM | POA: Diagnosis not present

## 2021-12-02 DIAGNOSIS — M109 Gout, unspecified: Secondary | ICD-10-CM | POA: Diagnosis not present

## 2021-12-02 DIAGNOSIS — Z0183 Encounter for blood typing: Secondary | ICD-10-CM | POA: Diagnosis not present

## 2021-12-02 DIAGNOSIS — E1122 Type 2 diabetes mellitus with diabetic chronic kidney disease: Secondary | ICD-10-CM | POA: Diagnosis not present

## 2021-12-03 DIAGNOSIS — M7502 Adhesive capsulitis of left shoulder: Secondary | ICD-10-CM | POA: Diagnosis not present

## 2021-12-03 DIAGNOSIS — I10 Essential (primary) hypertension: Secondary | ICD-10-CM | POA: Diagnosis not present

## 2021-12-06 IMAGING — CT NM PET TUM IMG INITIAL (PI) SKULL BASE T - THIGH
1 of 9 series · 1 of 25 positions shown · non-contrast
Comparison: Chest CT of 07/06/2006.

CLINICAL DATA: Initial treatment strategy for solitary pulmonary
nodule. Borderline diabetes. Born with 1 kidney.

EXAM:
NUCLEAR MEDICINE PET SKULL BASE TO THIGH
TECHNIQUE: 9.5 mCi F-18 FDG was injected intravenously. Full-ring PET imaging
was performed from the skull base to thigh after the radiotracer. CT
data was obtained and used for attenuation correction and anatomic
localization.
Fasting blood glucose: 122 mg/dl

[Series 3: ct wb 5.0 b30f · axial · 5.0mm · 0.98mm/px · 1 of 251 slices shown]
[im 251/251  brain]
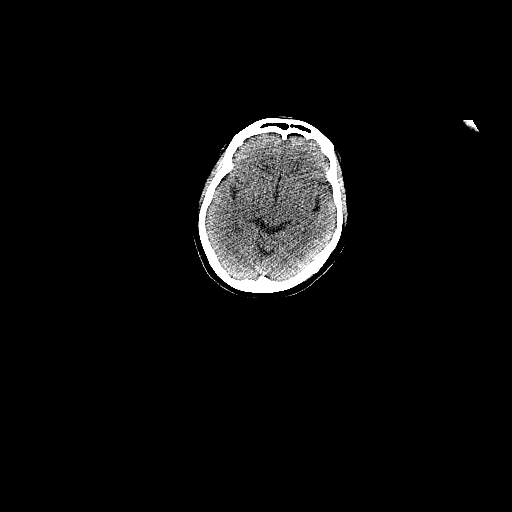

[1 of 25 positions shown; findings below may reference images not displayed]

FINDINGS: Mediastinal blood pool activity: SUV max

Liver activity: SUV max NA

NECK: Degraded evaluation of the neck secondary to motion and
muscular activity. No gross cervical nodal hypermetabolism.

Incidental CT findings: History of right-sided internal carotid
artery aneurysm coiling. No cervical adenopathy.

CHEST: No thoracic nodal hypermetabolism. An inferior lingular
relatively well-circumscribed nodule measures 1.0 cm and a S.U.V.
max of 3.9, including on 87/3. This is similar in size on image
[DATE] of the 07/06/2006 CT.

Incidental CT findings: Aortic atherosclerosis. Normal heart size,
without pericardial effusion. Multivessel coronary artery
atherosclerosis.

ABDOMEN/PELVIS: No abdominopelvic parenchymal or nodal
hypermetabolism.

Incidental CT findings: Normal adrenal glands. Markedly atrophic
left kidney with exophytic 8 mm low-density lesion, likely a cyst
within. Low-density right renal lesions are also likely cysts. The
largest is in the upper pole at 5.8 cm. Abdominal aortic
atherosclerosis. Small gallstones. A cystic lesion with minimal wall
calcification within the pancreatic uncinate process measures 2.5 cm
on 149/3. Subtly present at 1.0 cm on 07/06/2006. Abdominal aortic
atherosclerosis. Scattered colonic diverticula.

SKELETON: No abnormal marrow activity. Probable avascular necrosis
of both femoral heads. Lumbar spine fixation.

Incidental CT findings: none
IMPRESSION: 1. Hypermetabolic left upper lobe pulmonary nodule, similar in size
to the exam of 07/06/2006 and therefore benign.
2. Cystic lesion within the pancreatic uncinate process, present in
9009 but enlarged since that exam. Pseudocyst versus indolent cystic
neoplasm. Per consensus criteria, this warrants further evaluation
with pre and post contrast abdominal MRI/MRCP.
3. Markedly atrophic native left kidney.
4. Cholelithiasis.
5. Coronary artery atherosclerosis. Aortic Atherosclerosis
([id number]).

## 2021-12-14 ENCOUNTER — Other Ambulatory Visit: Payer: Self-pay | Admitting: Internal Medicine

## 2021-12-14 DIAGNOSIS — N6002 Solitary cyst of left breast: Secondary | ICD-10-CM | POA: Diagnosis not present

## 2021-12-14 DIAGNOSIS — N6452 Nipple discharge: Secondary | ICD-10-CM

## 2021-12-14 DIAGNOSIS — I10 Essential (primary) hypertension: Secondary | ICD-10-CM | POA: Diagnosis not present

## 2021-12-14 DIAGNOSIS — E1122 Type 2 diabetes mellitus with diabetic chronic kidney disease: Secondary | ICD-10-CM | POA: Diagnosis not present

## 2021-12-14 DIAGNOSIS — E782 Mixed hyperlipidemia: Secondary | ICD-10-CM | POA: Diagnosis not present

## 2021-12-16 DIAGNOSIS — D631 Anemia in chronic kidney disease: Secondary | ICD-10-CM | POA: Diagnosis not present

## 2021-12-16 DIAGNOSIS — N289 Disorder of kidney and ureter, unspecified: Secondary | ICD-10-CM | POA: Diagnosis not present

## 2021-12-16 DIAGNOSIS — N2581 Secondary hyperparathyroidism of renal origin: Secondary | ICD-10-CM | POA: Diagnosis not present

## 2021-12-16 DIAGNOSIS — M0560 Rheumatoid arthritis of unspecified site with involvement of other organs and systems: Secondary | ICD-10-CM | POA: Diagnosis not present

## 2021-12-16 DIAGNOSIS — N184 Chronic kidney disease, stage 4 (severe): Secondary | ICD-10-CM | POA: Diagnosis not present

## 2021-12-16 DIAGNOSIS — R809 Proteinuria, unspecified: Secondary | ICD-10-CM | POA: Diagnosis not present

## 2021-12-16 DIAGNOSIS — E119 Type 2 diabetes mellitus without complications: Secondary | ICD-10-CM | POA: Diagnosis not present

## 2021-12-16 DIAGNOSIS — N281 Cyst of kidney, acquired: Secondary | ICD-10-CM | POA: Diagnosis not present

## 2021-12-16 DIAGNOSIS — I2510 Atherosclerotic heart disease of native coronary artery without angina pectoris: Secondary | ICD-10-CM | POA: Diagnosis not present

## 2021-12-16 DIAGNOSIS — E785 Hyperlipidemia, unspecified: Secondary | ICD-10-CM | POA: Diagnosis not present

## 2021-12-16 DIAGNOSIS — I10 Essential (primary) hypertension: Secondary | ICD-10-CM | POA: Diagnosis not present

## 2021-12-18 DIAGNOSIS — E1122 Type 2 diabetes mellitus with diabetic chronic kidney disease: Secondary | ICD-10-CM | POA: Diagnosis not present

## 2021-12-18 DIAGNOSIS — I129 Hypertensive chronic kidney disease with stage 1 through stage 4 chronic kidney disease, or unspecified chronic kidney disease: Secondary | ICD-10-CM | POA: Diagnosis not present

## 2021-12-18 DIAGNOSIS — N184 Chronic kidney disease, stage 4 (severe): Secondary | ICD-10-CM | POA: Diagnosis not present

## 2021-12-23 DIAGNOSIS — I739 Peripheral vascular disease, unspecified: Secondary | ICD-10-CM | POA: Diagnosis not present

## 2021-12-23 DIAGNOSIS — M25512 Pain in left shoulder: Secondary | ICD-10-CM | POA: Diagnosis not present

## 2021-12-30 DIAGNOSIS — H1013 Acute atopic conjunctivitis, bilateral: Secondary | ICD-10-CM | POA: Diagnosis not present

## 2021-12-31 DIAGNOSIS — N2581 Secondary hyperparathyroidism of renal origin: Secondary | ICD-10-CM | POA: Diagnosis not present

## 2021-12-31 DIAGNOSIS — I2510 Atherosclerotic heart disease of native coronary artery without angina pectoris: Secondary | ICD-10-CM | POA: Diagnosis not present

## 2021-12-31 DIAGNOSIS — N281 Cyst of kidney, acquired: Secondary | ICD-10-CM | POA: Diagnosis not present

## 2021-12-31 DIAGNOSIS — E785 Hyperlipidemia, unspecified: Secondary | ICD-10-CM | POA: Diagnosis not present

## 2021-12-31 DIAGNOSIS — N261 Atrophy of kidney (terminal): Secondary | ICD-10-CM | POA: Diagnosis not present

## 2021-12-31 DIAGNOSIS — N184 Chronic kidney disease, stage 4 (severe): Secondary | ICD-10-CM | POA: Diagnosis not present

## 2021-12-31 DIAGNOSIS — M0560 Rheumatoid arthritis of unspecified site with involvement of other organs and systems: Secondary | ICD-10-CM | POA: Diagnosis not present

## 2021-12-31 DIAGNOSIS — D6862 Lupus anticoagulant syndrome: Secondary | ICD-10-CM | POA: Diagnosis not present

## 2021-12-31 DIAGNOSIS — E119 Type 2 diabetes mellitus without complications: Secondary | ICD-10-CM | POA: Diagnosis not present

## 2021-12-31 DIAGNOSIS — D631 Anemia in chronic kidney disease: Secondary | ICD-10-CM | POA: Diagnosis not present

## 2021-12-31 DIAGNOSIS — I10 Essential (primary) hypertension: Secondary | ICD-10-CM | POA: Diagnosis not present

## 2021-12-31 DIAGNOSIS — R809 Proteinuria, unspecified: Secondary | ICD-10-CM | POA: Diagnosis not present

## 2022-01-01 ENCOUNTER — Ambulatory Visit
Admission: RE | Admit: 2022-01-01 | Discharge: 2022-01-01 | Disposition: A | Discharge status: Home / Self Care | Payer: Medicare Other | Source: Ambulatory Visit | Attending: Internal Medicine | Admitting: Internal Medicine

## 2022-01-01 DIAGNOSIS — N6452 Nipple discharge: Secondary | ICD-10-CM | POA: Diagnosis not present

## 2022-01-04 DIAGNOSIS — M25511 Pain in right shoulder: Secondary | ICD-10-CM | POA: Diagnosis not present

## 2022-01-04 DIAGNOSIS — M19111 Post-traumatic osteoarthritis, right shoulder: Secondary | ICD-10-CM | POA: Diagnosis not present

## 2022-01-04 DIAGNOSIS — M7581 Other shoulder lesions, right shoulder: Secondary | ICD-10-CM | POA: Diagnosis not present

## 2022-01-06 DIAGNOSIS — M4314 Spondylolisthesis, thoracic region: Secondary | ICD-10-CM | POA: Diagnosis not present

## 2022-01-06 DIAGNOSIS — M5416 Radiculopathy, lumbar region: Secondary | ICD-10-CM | POA: Diagnosis not present

## 2022-01-07 DIAGNOSIS — N184 Chronic kidney disease, stage 4 (severe): Secondary | ICD-10-CM | POA: Diagnosis not present

## 2022-01-19 ENCOUNTER — Other Ambulatory Visit: Payer: Self-pay | Admitting: Surgery

## 2022-01-19 DIAGNOSIS — M19111 Post-traumatic osteoarthritis, right shoulder: Secondary | ICD-10-CM

## 2022-01-19 DIAGNOSIS — M7581 Other shoulder lesions, right shoulder: Secondary | ICD-10-CM

## 2022-01-25 ENCOUNTER — Ambulatory Visit
Admission: RE | Admit: 2022-01-25 | Discharge: 2022-01-25 | Disposition: A | Discharge status: Home / Self Care | Payer: Medicare Other | Source: Ambulatory Visit | Attending: Surgery | Admitting: Surgery

## 2022-01-25 DIAGNOSIS — M19111 Post-traumatic osteoarthritis, right shoulder: Secondary | ICD-10-CM | POA: Insufficient documentation

## 2022-01-25 DIAGNOSIS — M7581 Other shoulder lesions, right shoulder: Secondary | ICD-10-CM | POA: Insufficient documentation

## 2022-01-25 DIAGNOSIS — M19011 Primary osteoarthritis, right shoulder: Secondary | ICD-10-CM | POA: Diagnosis not present

## 2022-01-25 DIAGNOSIS — M25711 Osteophyte, right shoulder: Secondary | ICD-10-CM | POA: Diagnosis not present

## 2022-01-26 DIAGNOSIS — I2510 Atherosclerotic heart disease of native coronary artery without angina pectoris: Secondary | ICD-10-CM | POA: Diagnosis not present

## 2022-01-26 DIAGNOSIS — M1711 Unilateral primary osteoarthritis, right knee: Secondary | ICD-10-CM | POA: Diagnosis not present

## 2022-01-26 DIAGNOSIS — I10 Essential (primary) hypertension: Secondary | ICD-10-CM | POA: Diagnosis not present

## 2022-01-26 DIAGNOSIS — N184 Chronic kidney disease, stage 4 (severe): Secondary | ICD-10-CM | POA: Diagnosis not present

## 2022-01-26 DIAGNOSIS — E782 Mixed hyperlipidemia: Secondary | ICD-10-CM | POA: Diagnosis not present

## 2022-01-26 DIAGNOSIS — E1122 Type 2 diabetes mellitus with diabetic chronic kidney disease: Secondary | ICD-10-CM | POA: Diagnosis not present

## 2022-02-09 ENCOUNTER — Other Ambulatory Visit: Payer: Self-pay | Admitting: Surgery

## 2022-02-12 ENCOUNTER — Telehealth: Payer: Self-pay | Admitting: *Deleted

## 2022-02-12 NOTE — Patient Outreach (Signed)
  Care Coordination   Initial Visit Note   02/12/2022 Name: DARCIA LAMPI MRN: [id number] DOB: [birth year]  [name] is a 76 y.o. year old female who sees Perrin Maltese, MD for primary care. I spoke with  [name] by phone today.  What matters to the patients health and wellness today?  Patient would like to have assistance after her upcoming shoulder surgery. Patient would like to discuss applying for home aide with LCSW.   Goals Addressed               This Visit's Progress     Patient Stated     Patient Stated (pt-stated)        Patient's goal is to be assisted after her upcoming shoulder surgery. Patient would like to discuss applying for home aide with LCSW.         SDOH assessments and interventions completed:  Yes     Care Coordination Interventions Activated:  Yes  Care Coordination Interventions:  Yes, provided   Follow up plan: Follow up call scheduled for 02/26/22    Encounter Outcome:  Pt. Visit Completed   Emelia Loron RN, BSN [place] [phone] [email or web address]

## 2022-02-23 ENCOUNTER — Other Ambulatory Visit: Payer: Self-pay

## 2022-02-23 ENCOUNTER — Encounter
Admission: RE | Admit: 2022-02-23 | Discharge: 2022-02-23 | Disposition: A | Discharge status: Home / Self Care | Payer: Medicare Other | Source: Ambulatory Visit | Attending: Surgery | Admitting: Surgery

## 2022-02-23 VITALS — BP 124/76 | HR 70 | Resp 16 | Ht 63.0 in | Wt 177.5 lb

## 2022-02-23 DIAGNOSIS — I4510 Unspecified right bundle-branch block: Secondary | ICD-10-CM | POA: Diagnosis not present

## 2022-02-23 DIAGNOSIS — Z01818 Encounter for other preprocedural examination: Secondary | ICD-10-CM | POA: Diagnosis not present

## 2022-02-23 DIAGNOSIS — I10 Essential (primary) hypertension: Secondary | ICD-10-CM | POA: Diagnosis not present

## 2022-02-23 DIAGNOSIS — R8290 Unspecified abnormal findings in urine: Secondary | ICD-10-CM | POA: Diagnosis not present

## 2022-02-23 DIAGNOSIS — R809 Proteinuria, unspecified: Secondary | ICD-10-CM | POA: Diagnosis not present

## 2022-02-23 DIAGNOSIS — E785 Hyperlipidemia, unspecified: Secondary | ICD-10-CM | POA: Diagnosis not present

## 2022-02-23 DIAGNOSIS — Z01812 Encounter for preprocedural laboratory examination: Secondary | ICD-10-CM

## 2022-02-23 DIAGNOSIS — I2510 Atherosclerotic heart disease of native coronary artery without angina pectoris: Secondary | ICD-10-CM | POA: Diagnosis not present

## 2022-02-23 DIAGNOSIS — E119 Type 2 diabetes mellitus without complications: Secondary | ICD-10-CM | POA: Diagnosis not present

## 2022-02-23 DIAGNOSIS — Z01810 Encounter for preprocedural cardiovascular examination: Secondary | ICD-10-CM | POA: Diagnosis not present

## 2022-02-23 HISTORY — DX: Pneumonia, unspecified organism: J18.9

## 2022-02-23 LAB — CBC WITH DIFFERENTIAL/PLATELET
Abs Immature Granulocytes: 0.02 10*3/uL (ref 0.00–0.07)
Basophils Absolute: 0 10*3/uL (ref 0.0–0.1)
Basophils Relative: 1 %
Eosinophils Absolute: 0.2 10*3/uL (ref 0.0–0.5)
Eosinophils Relative: 3 %
HCT: 34.6 % — ABNORMAL LOW (ref 36.0–46.0)
Hemoglobin: 10.9 g/dL — ABNORMAL LOW (ref 12.0–15.0)
Immature Granulocytes: 0 %
Lymphocytes Relative: 26 %
Lymphs Abs: 1.6 10*3/uL (ref 0.7–4.0)
MCH: 29.3 pg (ref 26.0–34.0)
MCHC: 31.5 g/dL (ref 30.0–36.0)
MCV: 93 fL (ref 80.0–100.0)
Monocytes Absolute: 0.4 10*3/uL (ref 0.1–1.0)
Monocytes Relative: 7 %
Neutro Abs: 4.1 10*3/uL (ref 1.7–7.7)
Neutrophils Relative %: 63 %
Platelets: 181 10*3/uL (ref 150–400)
RBC: 3.72 MIL/uL — ABNORMAL LOW (ref 3.87–5.11)
RDW: 12.5 % (ref 11.5–15.5)
WBC: 6.4 10*3/uL (ref 4.0–10.5)
nRBC: 0 % (ref 0.0–0.2)

## 2022-02-23 LAB — URINALYSIS, ROUTINE W REFLEX MICROSCOPIC
Bilirubin Urine: NEGATIVE
Glucose, UA: NEGATIVE mg/dL
Hgb urine dipstick: NEGATIVE
Ketones, ur: NEGATIVE mg/dL
Nitrite: POSITIVE — AB
Protein, ur: 30 mg/dL — AB
Specific Gravity, Urine: 1.01 (ref 1.005–1.030)
pH: 5 (ref 5.0–8.0)

## 2022-02-23 LAB — COMPREHENSIVE METABOLIC PANEL
ALT: 13 U/L (ref 0–44)
AST: 18 U/L (ref 15–41)
Albumin: 4 g/dL (ref 3.5–5.0)
Alkaline Phosphatase: 56 U/L (ref 38–126)
Anion gap: 8 (ref 5–15)
BUN: 66 mg/dL — ABNORMAL HIGH (ref 8–23)
CO2: 27 mmol/L (ref 22–32)
Calcium: 9.4 mg/dL (ref 8.9–10.3)
Chloride: 109 mmol/L (ref 98–111)
Creatinine, Ser: 2.76 mg/dL — ABNORMAL HIGH (ref 0.44–1.00)
GFR, Estimated: 17 mL/min — ABNORMAL LOW (ref >60)
Glucose, Bld: 225 mg/dL — ABNORMAL HIGH (ref 70–99)
Potassium: 3.5 mmol/L (ref 3.5–5.1)
Sodium: 144 mmol/L (ref 135–145)
Total Bilirubin: 1.1 mg/dL (ref 0.3–1.2)
Total Protein: 6.7 g/dL (ref 6.5–8.1)

## 2022-02-23 LAB — SURGICAL PCR SCREEN
MRSA, PCR: NEGATIVE
Staphylococcus aureus: NEGATIVE

## 2022-02-23 NOTE — Patient Instructions (Addendum)
Your procedure is scheduled on: 03/02/22 - Tuesday  Report to the Registration Desk on the 1st floor of the [facility]. To find out your arrival time, please call [phone] between 1PM - 3PM on: 03/01/22 - Monday If your arrival time is 6:00 am, do not arrive prior to that time as the [facility] entrance doors do not open until 6:00 am.  REMEMBER: Instructions that are not followed completely may result in serious medical risk, up to and including death; or upon the discretion of your surgeon and anesthesiologist your surgery may need to be rescheduled.  Do not eat food after midnight the night before surgery.  No gum chewing, lozengers or hard candies.  You may however, drink CLEAR liquids up to 2 hours before you are scheduled to arrive for your surgery. Do not drink anything within 2 hours of your scheduled arrival time  Type 1 and Type 2 diabetics should only drink water.  In addition, your doctor has ordered for you to drink the provided  Gatorade G2 Drinking this carbohydrate drink up to two hours before surgery helps to reduce insulin resistance and improve patient outcomes. Please complete drinking 2 hours prior to scheduled arrival time.  TAKE THESE MEDICATIONS THE MORNING OF SURGERY WITH A SIP OF WATER:  - allopurinol (ZYLOPRIM) - hydrALAZINE (APRESOLINE) - hydroxychloroquine (PLAQUENIL) - labetalol (NORMODYNE)  - SYMBICORT 80-4.5 MCG/ACT inhaler - Use inhalers on the day of surgery and bring to the hospital.  Appointment with Lattie Corns, [place]  on 02/24/22, will follow up on when to stop Plavix.  One week prior to surgery: Stop Anti-inflammatories (NSAIDS) such as Advil, Aleve, Ibuprofen, Motrin, Naproxen, Naprosyn and Aspirin based products such as Excedrin, Goodys Powder, BC Powder.  Stop ANY OVER THE COUNTER supplements until after surgery.  You may however take Tylenol if needed for pain up until the day of surgery.  No Alcohol for 24 hours before or  after surgery.  No Smoking including e-cigarettes for 24 hours prior to surgery.  No chewable tobacco products for at least 6 hours prior to surgery.  No nicotine patches on the day of surgery.  Do not use any "recreational" drugs for at least a week prior to your surgery.  Please be advised that the combination of cocaine and anesthesia may have negative outcomes, up to and including death. If you test positive for cocaine, your surgery will be cancelled.  On the morning of surgery brush your teeth with toothpaste and water, you may rinse your mouth with mouthwash if you wish. Do not swallow any toothpaste or mouthwash.  Use CHG Soap or wipes as directed on instruction sheet.  Do not wear jewelry, make-up, hairpins, clips or nail polish.  Do not wear lotions, powders, or perfumes.   Do not shave body from the neck down 48 hours prior to surgery just in case you cut yourself which could leave a site for infection.  Also, freshly shaved skin may become irritated if using the CHG soap.  Contact lenses, hearing aids and dentures may not be worn into surgery.  Do not bring valuables to the hospital. [facility] is not responsible for any missing/lost belongings or valuables.   Total Shoulder Arthroplasty:  use Benzolyl Peroxide 5% Gel as directed on instruction sheet.  Notify your doctor if there is any change in your medical condition (cold, fever, infection).  Wear comfortable clothing (specific to your surgery type) to the hospital.  After surgery, you can help prevent  lung complications by doing breathing exercises.  Take deep breaths and cough every 1-2 hours. Your doctor may order a device called an Incentive Spirometer to help you take deep breaths. When coughing or sneezing, hold a pillow firmly against your incision with both hands. This is called "splinting." Doing this helps protect your incision. It also decreases belly discomfort.  If you are being admitted to the  hospital overnight, leave your suitcase in the car. After surgery it may be brought to your room.  If you are being discharged the day of surgery, you will not be allowed to drive home. You will need a responsible adult (18 years or older) to drive you home and stay with you that night.   If you are taking public transportation, you will need to have a responsible adult (18 years or older) with you. Please confirm with your physician that it is acceptable to use public transportation.   Please call the [facility] at [phone] if you have any questions about these instructions.  Surgery Visitation Policy:  Patients undergoing a surgery or procedure may have two family members or support persons with them as long as the person is not COVID-19 positive or experiencing its symptoms.   Inpatient Visitation:    Visiting hours are 7 a.m. to 8 p.m. Up to four visitors are allowed at one time in a patient room, including children. The visitors may rotate out with other people during the day. One designated support person (adult) may remain overnight.

## 2022-02-24 ENCOUNTER — Encounter: Payer: Self-pay | Admitting: Surgery

## 2022-02-24 DIAGNOSIS — M25561 Pain in right knee: Secondary | ICD-10-CM | POA: Diagnosis not present

## 2022-02-24 DIAGNOSIS — R809 Proteinuria, unspecified: Secondary | ICD-10-CM | POA: Diagnosis not present

## 2022-02-24 DIAGNOSIS — N184 Chronic kidney disease, stage 4 (severe): Secondary | ICD-10-CM | POA: Diagnosis not present

## 2022-02-24 DIAGNOSIS — N261 Atrophy of kidney (terminal): Secondary | ICD-10-CM | POA: Diagnosis not present

## 2022-02-24 DIAGNOSIS — D631 Anemia in chronic kidney disease: Secondary | ICD-10-CM | POA: Diagnosis not present

## 2022-02-24 DIAGNOSIS — M25511 Pain in right shoulder: Secondary | ICD-10-CM | POA: Diagnosis not present

## 2022-02-24 DIAGNOSIS — I10 Essential (primary) hypertension: Secondary | ICD-10-CM | POA: Diagnosis not present

## 2022-02-24 DIAGNOSIS — M19111 Post-traumatic osteoarthritis, right shoulder: Secondary | ICD-10-CM | POA: Diagnosis not present

## 2022-02-24 DIAGNOSIS — E785 Hyperlipidemia, unspecified: Secondary | ICD-10-CM | POA: Diagnosis not present

## 2022-02-24 DIAGNOSIS — D6862 Lupus anticoagulant syndrome: Secondary | ICD-10-CM | POA: Diagnosis not present

## 2022-02-24 DIAGNOSIS — I2510 Atherosclerotic heart disease of native coronary artery without angina pectoris: Secondary | ICD-10-CM | POA: Diagnosis not present

## 2022-02-24 DIAGNOSIS — M0560 Rheumatoid arthritis of unspecified site with involvement of other organs and systems: Secondary | ICD-10-CM | POA: Diagnosis not present

## 2022-02-24 DIAGNOSIS — E119 Type 2 diabetes mellitus without complications: Secondary | ICD-10-CM | POA: Diagnosis not present

## 2022-02-24 DIAGNOSIS — N281 Cyst of kidney, acquired: Secondary | ICD-10-CM | POA: Diagnosis not present

## 2022-02-24 DIAGNOSIS — G8929 Other chronic pain: Secondary | ICD-10-CM | POA: Diagnosis not present

## 2022-02-24 DIAGNOSIS — N2581 Secondary hyperparathyroidism of renal origin: Secondary | ICD-10-CM | POA: Diagnosis not present

## 2022-02-24 DIAGNOSIS — M1711 Unilateral primary osteoarthritis, right knee: Secondary | ICD-10-CM | POA: Diagnosis not present

## 2022-02-24 NOTE — Progress Notes (Signed)
Perioperative Services  Pre-Admission/Anesthesia Testing Clinical Review  Date: 02/26/22  Patient Demographics:  Name: [name] DOB:   07/20/1945 MRN:   [id number]  Planned Surgical Procedure(s):    Case: [id number] Date/Time: 03/02/22 0939   Procedures:      REVERSE SHOULDER ARTHROPLASTY (Right: Shoulder)     BICEPS TENODESIS (Right)   Anesthesia type: Choice   Pre-op diagnosis:      Right shoulder pain, unspecified chronicity M25.511     Rotator cuff tendinitis, right  M75.81     Post-traumatic osteoarthritis of right shoulder  M19.11   Location: [facility] OR ROOM 03 / [place] [facility]   Surgeons: Corky Mull, MD   NOTE: Available PAT nursing documentation and vital signs have been reviewed. Clinical nursing staff has updated patient's PMH/PSHx, current medication list, and drug allergies/intolerances to ensure comprehensive history available to assist in medical decision making as it pertains to the aforementioned surgical procedure and anticipated anesthetic course. Extensive review of available clinical information performed. [place] [facility] and PSHx updated with any diagnoses/procedures that  may have been inadvertently omitted during her intake with the pre-admission testing department's nursing staff.  Clinical Discussion:  [name] is a 76 y.o. female who is submitted for pre-surgical anesthesia review and clearance prior to her undergoing the above procedure. Patient has never been a smoker. Pertinent PMH includes: CAD, cardiac murmur, RBBB, multiple cerebral aneurysms (s/p coiling), elevated LEFT hemidiaphragm, diastolic dysfunction, IRBBB, HTN, HLD, T2DM, hyperparathyroidism, CKD-IV (solitary functioning kidney), OSAH (does not use nocturnal PAP therapy), anemia of chronic renal disease, OA, cervical DDD (s/p ACDF), RA (on DMARD), SLE, insomnia.  Patient is followed by cardiology Humphrey Rolls, MD). She was last seen in the cardiology clinic on  09/21/2021; notes reviewed. At the time of her clinic visit, the patient denied any chest pain, shortness of breath, PND, orthopnea, palpitations, significant peripheral edema, vertiginous symptoms, or presyncope/syncope.  Patient with a PMH significant for cardiovascular diagnoses.  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  ACOM aneurysms measured 6.2 x 4.5 mm and 4.9 x 4.2 mm.  RIGHT MCA aneurysm measured 7.0 x 4.5 mm.  Patient underwent endovascular obliteration of her complex ACOM aneurysms on 07/18/2006.  Patient underwent endovascular treatment of her RIGHT MCA aneurysm on 10/03/2006.  Patient noted to have 3.5 x 3.0 remnant of the RIGHT MCA trifurcation region aneurysm secondary to coil compaction on 09/14/2007.  Patient found to have 3.2 x 3.1 mm saccular outpouching in the previously treated ACOM aneurysm consistent with mild recannulization in the neck.  Patient underwent endovascular near complete obliteration of and enlarging neck aneurysm on 09/03/2010.  This was her previously treated RIGHT MCA aneurysm.  Procedure completed using stent assisted coiling.  Patient found to have a 3.7 x 2 mm neck remnant of a previously treated ACOM aneurysm on 01/28/2012.  She underwent attempted embolization procedure on 02/24/2012, however procedure was unsuccessful.  Patient underwent a diagnostic left heart catheterization on 02/23/2007 revealing normal left ventricular systolic function with an EF of 50%.  There was multivessel CAD; 30% proximal LAD and 70% mid RCA.  Staged PCI was planned for later date.  Patient ultimately underwent staged PCI procedure on 02/27/2007.  EF at that time was 60%.  Patient with an 80% stenosis of the mid RCA.  3.5 x 15 mm Vision BMS x 1 placed with 0% residual stenosis and TIMI-3 flow.  Patient underwent subsequent cardiac catheterizations on 07/24/2007,  10/28/2010, and 05/31/2013 all showing moderate nonobstructive CAD with minimal ISR of previously placed stent to the mid RCA.  Cardiology has not been further interventions necessary at this point and are treating patient with aggressive medical management for further ASCVD prevention.  Coronary CTA performed on 05/20/2014 revealed a RIGHT dominant system. Coronary calcium score 51.8. LAD and LCx with mild disease proximal to the stent in the mid RCA.  Repeat coronary CTA performed on 05/22/2015 revealed a coronary calcium score 592.3.  Cardiologist noted there was no significant disease in the LAD or LCx.  There was mild disease proximal to the stent in the mid RCA, but no significant ISR.  Myocardial perfusion imaging study performed on 07/03/2021 revealed normal left ventricular ejection fraction of 65%  There was no evidence of stress-induced myocardial ischemia or arrhythmia.  Study determined to be normal and low risk.  TTE performed on 07/07/2021 revealed normal left ventricular systolic function with an EF of 87.8%.  Diastolic Doppler parameters consistent with impaired relaxation (G1DD).  PASP normal.  There was trace tricuspid and mitral valve regurgitation.  There was no evidence of a significant transvalvular gradient to suggest stenosis.  Patient with an older generation cardiac stent in place, therefore she is on daily antiplatelet therapy using clopidogrel; compliant with therapy with no evidence or reports of GI bleeding.  Blood pressure mildly elevated at 146/84 mmHg on currently prescribed diuretic (furosemide), vasodilator (hydralazine), beta-blocker (labetalol), and ARB (losartan) therapies.  Patient is on rosuvastatin for her HLD diagnosis and further ASCVD prevention.  T2DM well-controlled on currently prescribed regimen; last Hgb A1c 6.9% when checked on 11/27/2020.  Patient does have an OSAH diagnosis, however she does not require the use of nocturnal PAP therapy. Functional  capacity, as defined by DASI, is documented as being >/= 4 METS.  No changes were made to her medication regimen.  Patient to follow-up with outpatient cardiology in 4 months or sooner if needed.  [name] is scheduled for an elective RIGHT REVERSE SHOULDER ARTHROPLASTY; RIGHT BICEPS TENODESIS on 03/02/2022 with Dr. Milagros Evener, MD.  Given patient's past medical history significant for cardiovascular diagnoses, presurgical cardiac clearance was sought by the PAT team. Per cardiology, "this patient is optimized for surgery and may proceed with the planned procedural course with a LOW risk of significant perioperative cardiovascular complications".  Again, this patient is on daily antiplatelet therapy.  She has been advised on recommendations from her primary attending cardiologist for holding her clopidogrel dose for 7 days prior to her procedure with plans to restart since postoperative bleeding is felt to be minimized by her primary attending surgeon.  Patient is aware that her last dose should be on 02/22/2022.  Patient denies previous perioperative complications with anesthesia in the past. In review of the available records, it is noted that patient underwent a general anesthetic course at [facility] ([facility]) in 08/2021 without documented complications. Of note, patient with cervical hardware in place from previous multilevel ACDF (C5-C7).  She notes some limited mobility of her cervical spine.  Copy of most recent imaging results below for review by the surgical/anesthetic team to assist with intubation planning.     02/23/2022   10:52 AM 10/04/2021    8:29 PM 10/04/2021    8:06 PM  Vitals with BMI  Height '5\' 3"'$     Weight 177 lbs 8 oz    BMI 69.48    Systolic 546  329  Diastolic 76  518  Pulse 70 107 99    Providers/Specialists:   NOTE: Primary physician provider listed below. Patient may have been seen by APP or partner within same practice.   PROVIDER ROLE /  SPECIALTY LAST OV  Poggi, Marshall Cork, MD Orthopedics (Surgeon) 01/04/2022  Perrin Maltese, MD Primary Care Provider 01/26/2022  Neoma Laming, MD Cardiology 09/21/2021  Lyla Son, MD Nephrology 12/16/2021  Dorthula Matas, MD Rheumatology 09/16/2021  Girtha Hake, MD Physiatry 10/06/2021   Allergies:  Amlodipine and Sulfa antibiotics  Current Home Medications:   No current facility-administered medications for this encounter.    acetaminophen (TYLENOL) 500 MG tablet   allopurinol (ZYLOPRIM) 100 MG tablet   cephALEXin (KEFLEX) 250 MG capsule   clopidogrel (PLAVIX) 75 MG tablet   clotrimazole-betamethasone (LOTRISONE) cream   furosemide (LASIX) 40 MG tablet   gabapentin (NEURONTIN) 100 MG capsule   glimepiride (AMARYL) 2 MG tablet   hydrALAZINE (APRESOLINE) 100 MG tablet   hydroxychloroquine (PLAQUENIL) 200 MG tablet   ibuprofen (ADVIL) 200 MG tablet   labetalol (NORMODYNE) 200 MG tablet   losartan (COZAAR) 100 MG tablet   rosuvastatin (CRESTOR) 40 MG tablet   SYMBICORT 80-4.5 MCG/ACT inhaler   traMADol (ULTRAM) 50 MG tablet   tretinoin (RETIN-A) 0.025 % cream   zolpidem (AMBIEN) 5 MG tablet   History:   Past Medical History:  Diagnosis Date   Anemia of chronic renal disease    Arthritis    Cerebral aneurysm 06/07/2006   a.) 6.2x4.41m and 4.9x4.236mACOM & 7x4.31m67mMCA aneur. b.)07/18/2006 -endovas oblit complex ACOM aneur. c.) Endovas Tx of 6.8x4mm57mCA aneur. d.) 3.5x3mm 57mnant RMCA aneur 2/2 coil compaction. e.) Interval 3.2x3.1mm s72mular outpouching in ACOM c/w mild recannulization in neck. 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 g.) 3.7x2mm re731mnt of prev Tx'd ACOM aneur -failed embol 02/24/2012.   CKD (chronic kidney disease), stage IV (HCC)    a.) solitary functioning kidney on the RIGHT   Complication of anesthesia    a.) delayed emergence   Coronary artery disease 02/23/2007   a.) LHC 02/23/2007 --> EF 50%; 30% pLAD, 70% mRCA -->  planned for staged PCI. b.) PCI 02/27/2007: EF 60%; 3.5 x 15 mm Vision BMS to 80% mRCA. b.) LHC 07/24/2007: EF 60%; minor irregs; no occlusive CAD; no intervention. c.) LHC 10/28/2010: EF 60%; 30% mLAD, LCx with minor luminal irregs, 20% ISR mRCA; no intervention. d.) LHC 05/31/2013: EF 60%; 40% mLAD, 30% ISR m-dRCA; no interventions.   DDD (degenerative disc disease), cervical    a.) s/p ACDF C5-C7; hardware in neck; patient appreciates stiffness and issues with mobility   Diastolic dysfunction    a.)  TTE 07/07/2021: EF 87.8%; normal PASP; trace TR/MR; G1DD.   Elevated hemidiaphragm    a.) LEFT   Headache(784.0)    Heart murmur    HLD (hyperlipidemia)    HOH (hard of hearing)    Has hearing aids, doesn't wear   Hyperparathyroidism due to renal insufficiency (HCC)    Hypertension    Incomplete right bundle branch block (RBBB)    Insomnia    a.) takes zolpidem   Long term (current) use of immunomodulator    a.) on DMARD therapy (hydroxychloriquine) for RA/SLE Dx.   Long term current use of antithrombotics/antiplatelets    a.) clopidogrel   Multiple acquired cysts of kidney    OSA (obstructive sleep apnea)    a.) does NOT use nocturnal pap therapy   Pancreatic  cyst    Pneumonia    RBBB (right bundle branch block)    Rheumatoid arthritis ([place])    a.) on DMARD; hydroxychloriquine   Systemic lupus erythematosus ([place])    T2DM (type 2 diabetes mellitus) ([place])    Wears dentures    partial lower   Past Surgical History:  Procedure Laterality Date   ANEURYSM COILING  08/2013   Attempted embolization of previously treated ACOM aneurysm; unsuccessful N/A 01/28/2012   Location: [place] CYST MARSUPIALIZATION     BREAST CYST ASPIRATION Right 01/25/2012   FNA neg.   BREAST EXCISIONAL BIOPSY Left 06/26/2007   neg   BUNIONECTOMY Bilateral 2022   CARDIAC CATHETERIZATION Left 02/23/2007   Procedure: CARDIAC CATHETERIZATION; Location: [place]; Surgeon:  Neoma Laming, MD   CARDIAC CATHETERIZATION Left 07/31/2007   Procedure: CARDIAC CATHETERIZATION; Location: [place]; Surgeon: Neoma Laming, MD   CARDIAC CATHETERIZATION Left 10/28/2010   Procedure: CARDIAC CATHETERIZATION; Location: [place]; Surgeon: Neoma Laming, MD   CARDIAC CATHETERIZATION Left 05/31/2013   Procedure: CARDIAC CATHETERIZATION; Location: [place]; Surgeon: Neoma Laming, MD   Carotid arteriogram; 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 N/A 07/18/2006   Location: [facility]   CATARACT EXTRACTION     CERVICAL FUSION N/A    Procedure: ACDF C5-C7   COLONOSCOPY WITH PROPOFOL N/A 10/04/2019   Procedure: COLONOSCOPY WITH BIOPSY;  Surgeon: Lin Landsman, MD;  Location: [place];  Service: Endoscopy;  Laterality: N/A;  Diabetic (borderline) - oral meds priority 3   CORONARY STENT INTERVENTION Left 02/27/2007   Procedure: CORONARY STENT INTERVENTION (3.5 x 15 mm Vision BMS to mRCA); Location: [place]; Surgeon: Kathlyn Sacramento, MD   Endovascular near complete obliteration of enlarging neck remnant of previously treated RIGHT MCA aneurysm using stent assisted coiling N/A 09/03/2010   Location: [facility]   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 N/A 10/03/2006   Location: [facility]   HYSTEROSCOPY WITH D & C N/A 08/04/2021   Procedure: DILATATION AND CURETTAGE /HYSTEROSCOPY;  Surgeon: Homero Fellers, MD;  Location: [facility] ORS;  Service: Gynecology;  Laterality: N/A;   POLYPECTOMY N/A 10/04/2019   Procedure: POLYPECTOMY;  Surgeon: Lin Landsman, MD;  Location: [place];  Service: Endoscopy;  Laterality: N/A;   UPPER ESOPHAGEAL ENDOSCOPIC ULTRASOUND (EUS) N/A 04/15/2016   Procedure: UPPER ESOPHAGEAL ENDOSCOPIC ULTRASOUND (EUS);  Surgeon: Holly Bodily, MD;  Location: [facility] ENDOSCOPY;  Service: Gastroenterology;  Laterality: N/A;   Family History  Problem Relation  Age of Onset   Breast cancer Paternal Aunt 55   Breast cancer Paternal 43    Heart disease Mother    Heart disease Father    Stroke Father    Diabetes Sister    Diabetes Brother    Social History   Tobacco Use   Smoking status: Never   Smokeless tobacco: Never  Vaping Use   Vaping Use: Never used  Substance Use Topics   Alcohol use: Yes    Comment: Occasionally at christmas   Drug use: No    Pertinent Clinical Results:  LABS: Labs reviewed: Acceptable for surgery.  Hospital Outpatient Visit on 02/23/2022  Component Date Value Ref Range Status   WBC 02/23/2022 6.4  4.0 - 10.5 K/uL Final   RBC 02/23/2022 3.72 (L)  3.87 - 5.11 MIL/uL Final   Hemoglobin 02/23/2022 10.9 (L)  12.0 - 15.0 g/dL Final   HCT 02/23/2022 34.6 (L)  36.0 - 46.0 % Final   MCV 02/23/2022 93.0  80.0 - 100.0 fL Final   MCH 02/23/2022 29.3  26.0 - 34.0 pg Final   MCHC 02/23/2022 31.5  30.0 - 36.0 g/dL Final   RDW 02/23/2022 12.5  11.5 - 15.5 % Final   Platelets 02/23/2022 181  150 - 400 K/uL Final   nRBC 02/23/2022 0.0  0.0 - 0.2 % Final   Neutrophils Relative % 02/23/2022 63  % Final   Neutro Abs 02/23/2022 4.1  1.7 - 7.7 K/uL Final   Lymphocytes Relative 02/23/2022 26  % Final   Lymphs Abs 02/23/2022 1.6  0.7 - 4.0 K/uL Final   Monocytes Relative 02/23/2022 7  % Final   Monocytes Absolute 02/23/2022 0.4  0.1 - 1.0 K/uL Final   Eosinophils Relative 02/23/2022 3  % Final   Eosinophils Absolute 02/23/2022 0.2  0.0 - 0.5 K/uL Final   Basophils Relative 02/23/2022 1  % Final   Basophils Absolute 02/23/2022 0.0  0.0 - 0.1 K/uL Final   Immature Granulocytes 02/23/2022 0  % Final   Abs Immature Granulocytes 02/23/2022 0.02  0.00 - 0.07 K/uL Final   Performed at [facility], [place], [place] [address]   Sodium 02/23/2022 144  135 - 145 mmol/L Final   Potassium 02/23/2022 3.5  3.5 - 5.1 mmol/L Final   Chloride 02/23/2022 109  98 - 111 mmol/L Final   CO2 02/23/2022 27  22 - 32  mmol/L Final   Glucose, Bld 02/23/2022 225 (H)  70 - 99 mg/dL Final   Glucose reference range applies only to samples taken after fasting for at least 8 hours.   BUN 02/23/2022 66 (H)  8 - 23 mg/dL Final   Creatinine, Ser 02/23/2022 2.76 (H)  0.44 - 1.00 mg/dL Final   Calcium 02/23/2022 9.4  8.9 - 10.3 mg/dL Final   Total Protein 02/23/2022 6.7  6.5 - 8.1 g/dL Final   Albumin 02/23/2022 4.0  3.5 - 5.0 g/dL Final   AST 02/23/2022 18  15 - 41 U/L Final   ALT 02/23/2022 13  0 - 44 U/L Final   Alkaline Phosphatase 02/23/2022 56  38 - 126 U/L Final   Total Bilirubin 02/23/2022 1.1  0.3 - 1.2 mg/dL Final   GFR, Estimated 02/23/2022 17 (L)  >60 mL/min Final   Comment: (NOTE) Calculated using the CKD-EPI Creatinine Equation (2021)    Anion gap 02/23/2022 8  5 - 15 Final   Performed at [facility], [place], [place] [address]   Color, Urine 02/23/2022 YELLOW (A)  YELLOW Final   APPearance 02/23/2022 CLEAR (A)  CLEAR Final   Specific Gravity, Urine 02/23/2022 1.010  1.005 - 1.030 Final   pH 02/23/2022 5.0  5.0 - 8.0 Final   Glucose, UA 02/23/2022 NEGATIVE  NEGATIVE mg/dL Final   Hgb urine dipstick 02/23/2022 NEGATIVE  NEGATIVE Final   Bilirubin Urine 02/23/2022 NEGATIVE  NEGATIVE Final   Ketones, ur 02/23/2022 NEGATIVE  NEGATIVE mg/dL Final   Protein, ur 02/23/2022 30 (A)  NEGATIVE mg/dL Final   Nitrite 02/23/2022 POSITIVE (A)  NEGATIVE Final   Leukocytes,Ua 02/23/2022 TRACE (A)  NEGATIVE Final   RBC / HPF 02/23/2022 0-5  0 - 5 RBC/hpf Final   WBC, UA 02/23/2022 6-10  0 - 5 WBC/hpf Final   Bacteria, UA 02/23/2022 MANY (A)  NONE SEEN Final   Squamous Epithelial / LPF 02/23/2022 0-5  0 - 5 Final  Hyaline Casts, UA 02/23/2022 PRESENT   Final   Performed at [facility], [address] [place], [place] [address]   MRSA, PCR 02/23/2022 NEGATIVE  NEGATIVE Final   Staphylococcus aureus 02/23/2022 NEGATIVE  NEGATIVE Final   Comment: (NOTE) The Xpert SA Assay  (FDA approved for NASAL specimens in patients 53 years of age and older), is one component of a comprehensive surveillance program. It is not intended to diagnose infection nor to guide or monitor treatment. Performed at [facility], [address] [place], [place] [address]      ECG: Date: 02/23/2022 Time ECG obtained: 1043 AM Rate: 68 bpm Rhythm:  Normal sinus rhythm; RBBB Axis (leads I and aVF): Normal Intervals: PR 196 ms. QRS 154 ms. QTc 501 ms. ST segment and T wave changes: No evidence of acute ST segment elevation or depression Comparison: Similar to tracing obtained on 07/29/2021   IMAGING / PROCEDURES: CT SHOULDER RIGHT WO CONTRAST performed on 01/25/2022 Severe osteoarthritis of the RIGHT glenohumeral joint with anterior marginal osteophytes Small subchondral cyst in the anterior inferior glenoid 15 mm loose body along the axillary recess No fluid collection or hematoma.  No soft mass.  MYOCARDIAL PERFUSION IMAGING STUDY (LEXISCAN) performed on 07/03/2021 LVEF 65% No significant ST changes  No evidence of stress-induced myocardial ischemia or arrhythmia Study determined to be normal and low risk  TRANSTHORACIC ECHOCARDIOGRAM performed on 07/07/2021 LVEF 87.8% Normal left ventricular systolic function with mild LVH Diastolic parameters consistent with impaired relaxation (G1DD) Normal right ventricular systolic/diastolic function Globally normal wall motion Trace TR and MR Normal PASP  MRI BRAIN WITHOUT CONTRAST performed on 07/15/2021 No acute intracranial abnormality Moderate chronic small vessel ischemic disease Major intracranial vascular flow voids are preserved Prior coiling of the RIGHT MCA and anterior communicating region aneurysms Evidence of previous BILATERAL cataract extraction Mild mucous retention cyst in the maxillary sinuses  DIAGNOSTIC RADIOGRAPHS OF CHEST performed on 07/15/2021 Stable cardiomediastinal silhouette Stable  elevated LEFT hemidiaphragm No acute pulmonary disease Bony thorax is unremarkable  MRI CERVICAL SPINE WO CONTRAST performed on 01/12/2021 At T1-T2, moderate to severe canal stenosis, mass effect on the cord, and suspected faint cord edema at this level. Severe right and moderate left foraminal stenosis. Moderate to severe left foraminal stenosis at C2-C3 and C3-C4 Moderate left foraminal stenosis C4-C5. C5-C7 ACDF without significant stenosis at these levels. Mild canal stenosis at C2-C3 and T2-T3  LEFT HEART CATHETERIZATION AND CORONARY ANGIOGRAPHY performed on 05/31/2013 LVEF 60% Multivessel CAD 40% stenosis of the mid LAD 30% stenosis with ISR of the mid to distal RCA Recommendations Further intervention deferred Aggressive medical management for risk factor reduction and ASCVD prevention  Impression and Plan:  Atticus N Marinaro has been referred for pre-anesthesia review and clearance prior to her undergoing the planned anesthetic and procedural courses. Available labs, pertinent testing, and imaging results were personally reviewed by me. This patient has been appropriately cleared by cardiology with an overall low  risk of significant perioperative cardiovascular complications.  Based on clinical review performed today (02/26/22), barring any significant acute changes in the patient's overall condition, it is anticipated that she will be able to proceed with the planned surgical intervention. Any acute changes in clinical condition may necessitate her procedure being postponed and/or cancelled. Patient will meet with anesthesia team (MD and/or CRNA) on the day of her procedure for preoperative evaluation/assessment. Questions regarding anesthetic course will be fielded at that time.   Pre-surgical instructions were reviewed with the patient during  her PAT appointment and questions were fielded by PAT clinical staff. Patient was advised that if any questions or concerns arise prior  to her procedure then she should return a call to PAT and/or her surgeon's office to discuss.  Honor Loh, MSN, APRN, FNP-C, CEN [facility]  Peri-operative Services Nurse Practitioner Phone: [phone] Fax: [phone] 02/26/22 7:31 AM  NOTE: This note has been prepared using Dragon dictation software. Despite my best ability to proofread, there is always the potential that unintentional transcriptional errors may still occur from this process.

## 2022-02-25 ENCOUNTER — Telehealth: Payer: Self-pay | Admitting: Urgent Care

## 2022-02-25 DIAGNOSIS — Z01812 Encounter for preprocedural laboratory examination: Secondary | ICD-10-CM

## 2022-02-25 DIAGNOSIS — B9620 Unspecified Escherichia coli [E. coli] as the cause of diseases classified elsewhere: Secondary | ICD-10-CM

## 2022-02-25 LAB — URINE CULTURE: Culture: 80000 — AB

## 2022-02-25 MED ORDER — CEPHALEXIN 250 MG PO CAPS: 250.0000 mg | ORAL_CAPSULE | Freq: Three times a day (TID) | ORAL | 0 refills | Status: AC

## 2022-02-25 NOTE — Progress Notes (Signed)
[facility] Perioperative Services: Pre-Admission/Anesthesia Testing  Abnormal Lab Notification and Treatment Plan of Care   Date: 02/25/22  Name: [name] MRN:   [id number]  Re: Abnormal labs noted during PAT appointment   Notified:  Provider Name Provider Role Notification Mode  Poggi, Jenny Reichmann, MD Orthopedics (Surgeon) Routed and/or faxed via [facility]   Abnormal Lab Value(s):   Lab Results  Component Value Date   COLORURINE YELLOW (A) 02/23/2022   APPEARANCEUR CLEAR (A) 02/23/2022   LABSPEC 1.010 02/23/2022   PHURINE 5.0 02/23/2022   GLUCOSEU NEGATIVE 02/23/2022   HGBUR NEGATIVE 02/23/2022   BILIRUBINUR NEGATIVE 02/23/2022   KETONESUR NEGATIVE 02/23/2022   PROTEINUR 30 (A) 02/23/2022   NITRITE POSITIVE (A) 02/23/2022   LEUKOCYTESUR TRACE (A) 02/23/2022   EPIU 0-5 02/23/2022   WBCU 6-10 02/23/2022   RBCU 0-5 02/23/2022   BACTERIA MANY (A) 02/23/2022   CULT 80,000 COLONIES/mL ESCHERICHIA COLI (A) 02/23/2022    Clinical Information and Notes:  Patient is scheduled for elective RIGHT REVERSE SHOULDER ARTHROPLASTY; BICEPS TENODESIS on 03/02/2022 with Dr. Milagros Evener, MD.   UA performed in PAT consistent with/concerning for infection.  No leukocytosis noted on CBC; WBC 6400 Renal function: Estimated Creatinine Clearance: 17.7 mL/min (A) (by C-G formula based on SCr of 2.76 mg/dL (H)). Urine C&S added to assess for pathogenically significant growth.  Impression and Plan:  [name] with a UA that was (+) for infection; (+) for nitrites, LE, and many bacteria. Reflex culture sent that grew out a significant GNR colony count; pathogen identified as Escherichia coli. Contacted patient to discuss. Patient reporting that she is experiencing minor urinary frequency and "light stinging". She denies nausea, vomiting, fevers, abdominal pain, and back pain. Patient with surgery scheduled soon. Discussed that urine testing and minor symptoms likely  represents a minor infection, or one that has been caught early. In efforts to avoid delaying patient's procedure, or have her experience any potentially significant perioperative complications related to the aforementioned, I would like to proceed with empiric treatment for urinary tract infection.  Allergies reviewed. Culture report also reviewed to ensure culture appropriate coverage is being provided. Patient has decreased CrCl as noted above, therefore she will require renal dose adjustment. Will treat with a 5 day course of CEPHALEXIN. Patient encouraged to complete the entire course of antibiotics even if she begins to feel better. She was advised that if culture demonstrates resistance to the prescribed antibiotic, she will be contacted and advised of the need to change the antibiotic being used to treat her infection.   Meds ordered this encounter  Medications   cephALEXin (KEFLEX) 250 MG capsule    Sig: Take 1 capsule (250 mg total) by mouth 3 (three) times daily for 5 days. Increase WATER intake while on this medication.    Dispense:  15 capsule    Refill:  0   Patient encouraged to increase her fluid intake as much as possible. Discussed that water is always best to flush the urinary tract.   May use Tylenol as needed for pain/fever should she experience these symptoms.   Patient instructed to call surgeon's office or PAT with any questions or concerns related to the above outlined course of treatment. Additionally, she was instructed to call if she feels like she is getting worse overall while on treatment. Results and treatment plan of care forwarded to primary attending surgeon to make them aware.   Encounter Diagnoses  Name Primary?   Pre-operative laboratory examination  Yes   E. coli UTI (urinary tract infection)    Honor Loh, MSN, APRN, FNP-C, CEN [facility]  Peri-operative Services Nurse Practitioner Phone: [phone] Fax: [phone] 02/25/22  8:17 AM  NOTE: This note has been prepared using Dragon dictation software. Despite my best ability to proofread, there is always the potential that unintentional transcriptional errors may still occur from this process.

## 2022-02-26 ENCOUNTER — Ambulatory Visit: Payer: Self-pay | Admitting: *Deleted

## 2022-02-26 ENCOUNTER — Telehealth: Payer: Self-pay | Admitting: *Deleted

## 2022-02-26 ENCOUNTER — Ambulatory Visit: Payer: Self-pay

## 2022-02-26 DIAGNOSIS — Z139 Encounter for screening, unspecified: Secondary | ICD-10-CM

## 2022-02-26 NOTE — Telephone Encounter (Signed)
   Telephone encounter was:  Successful.  02/26/2022 Name: [name] MRN: [id number] DOB: Feb 10, 1946  [name] is a 76 y.o. year old female who is a primary care patient of Perrin Maltese, MD . The community resource team was consulted for assistance with Transportation Needs   Care guide performed the following interventions: Patient provided with information about care guide support team and interviewed to confirm resource needs.  Provided information to patient on medicaid transportaion through [place] health care dual . Patient understand the process FollowUp Plan:  No further follow up planned at this time. The patient has been provided with needed resources.  [place] [phone] [address] , [place] [address] Email : [email or web address]

## 2022-02-26 NOTE — Patient Outreach (Signed)
  Care Coordination   Initial Visit Note   02/26/2022 Name: [name] MRN: [id number] DOB: 1946-05-11  [name] is a 76 y.o. year old female who sees Perrin Maltese, MD for primary care. I spoke with  [name] by phone today.  What matters to the patients health and wellness today?  In home care needs following her surgery    Goals Addressed             This Visit's Progress    In home care needs       Care Coordination Interventions: Patient states that she is having surgery on shoulder 03/02/22 and then eventually her spine and is requesting assistance with in home care Patient confirms that her daughter is available for assistance, however she works and needs additional help with her ADL's Patient has full Medicaid and has been assessed by [place] phone call to [facility] that patient was assessed on 02/11/22 and was approved for 24 hours per month Patient chose [facility] for the first choice for an agency, however they did not respond to the request. Confirmed that patient was accepted by [facility]. [place] phone call to them that confirmed that they have received and accepted the referral They will call patient  on Monday to complete the assessment to start services General expectations discussed related to personal care services as well as possibility for patient to be evaluated by therapy following her surgery to determine PT/OT needs Solution-Focused Strategies employed:  Active listening / Reflection utilized          SDOH assessments and interventions completed:  Yes  SDOH Interventions Today    Flowsheet Row Most Recent Value  SDOH Interventions   Utilities Interventions Intervention Not Indicated  Stress Interventions Intervention Not Indicated        Care Coordination Interventions Activated:  Yes  Care Coordination Interventions:  Yes, provided   Follow  up plan: Follow up call scheduled for 03/01/22    Encounter Outcome:  Pt. Visit Completed

## 2022-02-26 NOTE — Patient Instructions (Signed)
  Visit Information  Thank you for taking time to visit with me today. Please don't hesitate to contact me if I can be of assistance to you.   Following are the goals we discussed today:   Goals Addressed             This Visit's Progress    In home care needs       Care Coordination Interventions: Patient states that she is having surgery on shoulder 03/02/22 and then eventually her spine and is requesting assistance with in home care Patient confirms that her daughter is available for assistance, however she works and needs additional help with her ADL's Patient has full Medicaid and has been assessed by [place] phone call to [facility] that patient was assessed on 02/11/22 and was approved for 24 hours per month Patient chose [facility] for the first choice for an agency, however they did not respond to the request. Confirmed that patient was accepted by [facility]. [place] phone call to them that confirmed that they have received and accepted the referral They will call patient  on Monday to complete the assessment to start services General expectations discussed related to personal care services as well as possibility for patient to be evaluated by therapy following her surgery to determine PT/OT needs Solution-Focused Strategies employed:  Active listening / Reflection utilized          Our next appointment is by telephone on 03/01/22 at 3pm  Please call the care guide team at [phone] if you need to cancel or reschedule your appointment.   If you are experiencing a Mental Health or [place] or need someone to talk to, please call the Suicide and Crisis Lifeline: 988   Patient verbalizes understanding of instructions and care plan provided today and agrees to view in [place]. Active MyChart status and patient understanding of how to access instructions and care plan via MyChart confirmed  with patient.     Telephone follow up appointment with care management team member scheduled for: 03/01/22  Elliot Gurney, [place] Worker  [facility] Care Management [phone]

## 2022-02-26 NOTE — Patient Outreach (Signed)
  Care Coordination   Initial Visit Note   02/26/2022 Name: [name] MRN: [id number] DOB: 20-Apr-1946  [name] is a 76 y.o. year old female who sees Perrin Maltese, MD for primary care. I spoke with  [name] by phone today.  What matters to the patients health and wellness today?  Patient states she is scheduled to have right shoulder surgery on 03/02/22. She states she may need assistance with her care as well as transportation after surgery. Patient states her daughter is currently living with her and helps when she can because she works.  Patient states she has arthritic pain in her fingers and spine area. She states she uses a cane. Reports sustaining a fall earlier this year. She states she injured her left shoulder and continues to have pain but is not able to have surgery on left shoulder due to the type of injury per her doctor.  Patient states her rheumatologist is no longer at the practice and she does not have a new one.     Goals Addressed             This Visit's Progress    Patient Stated:  " I want to manage my arthritis pain"       Care Coordination Interventions: Evaluation of current treatment plan related to arthritis tr and patient's adherence to plan as established by provider Advised patient to contact rheumatology office to request transfer to new provider since previous provider has left practice.  Reviewed medications with patient and discussed importance of compliance Reviewed scheduled/upcoming provider appointments  Discussed plans with patient for ongoing care management follow up and provided patient with direct contact information for care management team Advised patient to discuss with primary care provider need for new referral to rheumatologist if  unable to be transferred to new provider in same clinic previously being seen.  Assessed social determinant of health barriers Discussed importance of not taking non steroidal  anti-inflammatory medications to maintain kidney health.  Referred patient to community resource guide for transportation assistance.  Discussed with patient she is scheduled to have telephone outreach visit with social worker to discuss in home care assistance.           SDOH assessments and interventions completed:  Yes  SDOH Interventions Today    Flowsheet Row Most Recent Value  SDOH Interventions   Food Insecurity Interventions Intervention Not Indicated  Housing Interventions Intervention Not Indicated  Transportation Interventions Other (Comment)  [patient states she may need transportation assistance after shoulder surgery]        Care Coordination Interventions Activated:  Yes  Care Coordination Interventions:  Yes, provided   Follow up plan: Follow up call scheduled for 04/08/22 at 11:00 am    Encounter Outcome:  Pt. Visit Completed   Quinn Plowman RN,BSN,CCM [place] [phone] direct line

## 2022-02-26 NOTE — Patient Instructions (Signed)
Visit Information  Thank you for taking time to visit with me today. Please don't hesitate to contact me if I can be of assistance to you.   Following are the goals we discussed today:   Goals Addressed             This Visit's Progress    Patient Stated:  " I want to manage my arthritis pain"       Care Coordination Interventions: Evaluation of current treatment plan related to arthritis tr and patient's adherence to plan as established by provider Advised patient to contact rheumatology office to request transfer to new provider since previous provider has left practice.  Reviewed medications with patient and discussed importance of compliance Reviewed scheduled/upcoming provider appointments  Discussed plans with patient for ongoing care management follow up and provided patient with direct contact information for care management team Advised patient to discuss with primary care provider need for new referral to rheumatologist if  unable to be transferred to new provider in same clinic previously being seen.  Assessed social determinant of health barriers Discussed importance of not taking non steroidal anti-inflammatory medications to maintain kidney health.  Referred patient to community resource guide for transportation assistance.  Discussed with patient she is scheduled to have telephone outreach visit with social worker to discuss in home care assistance.           Our next appointment is by telephone on 04/08/22 at 11:00 am  Please call the care guide team at [phone] if you need to cancel or reschedule your appointment.   If you are experiencing a Mental Health or [place] or need someone to talk to, please call 1-800-273-TALK (toll free, 24 hour hotline)  Patient verbalizes understanding of instructions and care plan provided today and agrees to view in [place]. Active MyChart status and patient understanding of how to access instructions and care  plan via MyChart confirmed with patient.     Quinn Plowman RN,BSN,CCM [place] Coordination [phone] direct line

## 2022-03-01 ENCOUNTER — Ambulatory Visit: Payer: Self-pay | Admitting: *Deleted

## 2022-03-01 MED ORDER — SODIUM CHLORIDE 0.9 % IV SOLN: INTRAVENOUS | Status: DC

## 2022-03-01 MED ORDER — CEFAZOLIN SODIUM-DEXTROSE 2-4 GM/100ML-% IV SOLN
2.0000 g | INTRAVENOUS | Status: AC
  Administered 2022-03-02: 2 g via INTRAVENOUS

## 2022-03-01 MED ORDER — CHLORHEXIDINE GLUCONATE 0.12 % MT SOLN: 15.0000 mL | Freq: Once | OROMUCOSAL | Status: DC

## 2022-03-01 MED ORDER — ORAL CARE MOUTH RINSE: 15.0000 mL | Freq: Once | OROMUCOSAL | Status: DC

## 2022-03-01 NOTE — Patient Instructions (Signed)
Visit Information  Thank you for taking time to visit with me today. Please don't hesitate to contact me if I can be of assistance to you.   Following are the goals we discussed today:   Goals Addressed             This Visit's Progress    In home care needs       Care Coordination Interventions: Patient continues to confirm plan to have surgery on shoulder 03/02/22 and then eventually her spine and is requesting assistance with in home care Patient confirms that her daughter is available for assistance, however she works and needs additional help with her ADL's-patient's daughter will provide transport and will assist with care following her surgery Patient confirmed that she has not received a call from [facility] home health agency to complete the initial assessment to begin services [place] phone call to [facility], spoke with the nurse who confirmed that she will call patient today to schedule the initial intake Return call to patient who confirmed that the nurse will be coming today to complete initial assessment to begin care        Our next appointment is by telephone on 03/16/22 at 2pm  Please call the care guide team at [phone] if you need to cancel or reschedule your appointment.   If you are experiencing a Mental Health or [place] or need someone to talk to, please call the Suicide and Crisis Lifeline: 988   Patient verbalizes understanding of instructions and care plan provided today and agrees to view in [place]. Active MyChart status and patient understanding of how to access instructions and care plan via MyChart confirmed with patient.     Telephone follow up appointment with care management team member scheduled for:03/16/22  Elliot Gurney, [place] Worker  [facility] Care Management [phone]

## 2022-03-01 NOTE — Patient Outreach (Signed)
  Care Coordination   Follow Up Visit Note   03/01/2022 Name: LURETTA EVERLY MRN: [id number] DOB: 1946/02/02  [name] is a 76 y.o. year old female who sees Perrin Maltese, MD for primary care. I spoke with  [name] by phone today.  What matters to the patients health and wellness today?  In home care needs    Goals Addressed             This Visit's Progress    In home care needs       Care Coordination Interventions: Patient continues to confirm plan to have surgery on shoulder 03/02/22 and then eventually her spine and is requesting assistance with in home care Patient confirms that her daughter is available for assistance, however she works and needs additional help with her ADL's-patient's daughter will provide transport and will assist with care following her surgery Patient confirmed that she has not received a call from [facility] home health agency to complete the initial assessment to begin services [place] phone call to [facility], spoke with the nurse who confirmed that she will call patient today to schedule the initial intake Return call to patient who confirmed that the nurse will be coming today to complete initial assessment to begin care        SDOH assessments and interventions completed:  No     Care Coordination Interventions Activated:  Yes  Care Coordination Interventions:  Yes, provided   Follow up plan: Follow up call scheduled for 03/16/22    Encounter Outcome:  Pt. Visit Completed

## 2022-03-02 ENCOUNTER — Encounter
Admission: RE | Disposition: A | Discharge status: Home / Self Care | Payer: Self-pay | Source: Home / Self Care | Attending: Surgery

## 2022-03-02 ENCOUNTER — Observation Stay
Admission: RE | Admit: 2022-03-02 | Discharge: 2022-03-05 | Disposition: A | Discharge status: Home / Self Care | Payer: Medicare Other | Attending: Surgery | Admitting: Surgery

## 2022-03-02 ENCOUNTER — Ambulatory Visit: Payer: Medicare Other

## 2022-03-02 ENCOUNTER — Encounter: Payer: Self-pay | Admitting: Surgery

## 2022-03-02 ENCOUNTER — Ambulatory Visit: Payer: Medicare Other | Admitting: Urgent Care

## 2022-03-02 ENCOUNTER — Other Ambulatory Visit: Payer: Self-pay

## 2022-03-02 DIAGNOSIS — Z96611 Presence of right artificial shoulder joint: Secondary | ICD-10-CM

## 2022-03-02 DIAGNOSIS — Z01812 Encounter for preprocedural laboratory examination: Secondary | ICD-10-CM

## 2022-03-02 DIAGNOSIS — E1122 Type 2 diabetes mellitus with diabetic chronic kidney disease: Secondary | ICD-10-CM | POA: Insufficient documentation

## 2022-03-02 DIAGNOSIS — Z7902 Long term (current) use of antithrombotics/antiplatelets: Secondary | ICD-10-CM | POA: Insufficient documentation

## 2022-03-02 DIAGNOSIS — Z79899 Other long term (current) drug therapy: Secondary | ICD-10-CM | POA: Diagnosis not present

## 2022-03-02 DIAGNOSIS — M19111 Post-traumatic osteoarthritis, right shoulder: Secondary | ICD-10-CM | POA: Diagnosis not present

## 2022-03-02 DIAGNOSIS — M7581 Other shoulder lesions, right shoulder: Secondary | ICD-10-CM | POA: Diagnosis not present

## 2022-03-02 DIAGNOSIS — M75101 Unspecified rotator cuff tear or rupture of right shoulder, not specified as traumatic: Secondary | ICD-10-CM | POA: Diagnosis not present

## 2022-03-02 DIAGNOSIS — I1310 Hypertensive heart and chronic kidney disease without heart failure, with stage 1 through stage 4 chronic kidney disease, or unspecified chronic kidney disease: Secondary | ICD-10-CM | POA: Diagnosis not present

## 2022-03-02 DIAGNOSIS — M7522 Bicipital tendinitis, left shoulder: Principal | ICD-10-CM | POA: Insufficient documentation

## 2022-03-02 DIAGNOSIS — M7521 Bicipital tendinitis, right shoulder: Secondary | ICD-10-CM | POA: Diagnosis not present

## 2022-03-02 DIAGNOSIS — N184 Chronic kidney disease, stage 4 (severe): Secondary | ICD-10-CM | POA: Diagnosis not present

## 2022-03-02 DIAGNOSIS — I2510 Atherosclerotic heart disease of native coronary artery without angina pectoris: Secondary | ICD-10-CM | POA: Insufficient documentation

## 2022-03-02 DIAGNOSIS — G8918 Other acute postprocedural pain: Secondary | ICD-10-CM | POA: Diagnosis not present

## 2022-03-02 DIAGNOSIS — Z471 Aftercare following joint replacement surgery: Secondary | ICD-10-CM | POA: Diagnosis not present

## 2022-03-02 HISTORY — DX: Other cervical disc degeneration, unspecified cervical region: M50.30

## 2022-03-02 HISTORY — PX: REVERSE SHOULDER ARTHROPLASTY: SHX5054

## 2022-03-02 HISTORY — PX: BICEPT TENODESIS: SHX5116

## 2022-03-02 LAB — TYPE AND SCREEN
ABO/RH(D): O POS
Antibody Screen: NEGATIVE

## 2022-03-02 LAB — GLUCOSE, CAPILLARY
Glucose-Capillary: 143 mg/dL — ABNORMAL HIGH (ref 70–99)
Glucose-Capillary: 321 mg/dL — ABNORMAL HIGH (ref 70–99)

## 2022-03-02 SURGERY — ARTHROPLASTY, SHOULDER, TOTAL, REVERSE
Anesthesia: General | Site: Shoulder | Laterality: Right

## 2022-03-02 MED ORDER — GLYCOPYRROLATE 0.2 MG/ML IJ SOLN
INTRAMUSCULAR | Status: DC | PRN
  Administered 2022-03-02: .2 mg via INTRAVENOUS

## 2022-03-02 MED ORDER — FENTANYL CITRATE (PF) 100 MCG/2ML IJ SOLN
INTRAMUSCULAR | Status: DC | PRN
  Administered 2022-03-02 (×2): 50 ug via INTRAVENOUS

## 2022-03-02 MED ORDER — EPHEDRINE SULFATE (PRESSORS) 50 MG/ML IJ SOLN
INTRAMUSCULAR | Status: DC | PRN
  Administered 2022-03-02: 10 mg via INTRAVENOUS
  Administered 2022-03-02 (×2): 5 mg via INTRAVENOUS

## 2022-03-02 MED ORDER — DEXAMETHASONE SODIUM PHOSPHATE 10 MG/ML IJ SOLN
INTRAMUSCULAR | Status: DC | PRN
  Administered 2022-03-02: 5 mg via INTRAVENOUS

## 2022-03-02 MED ORDER — FENTANYL CITRATE (PF) 100 MCG/2ML IJ SOLN: 25.0000 ug | INTRAMUSCULAR | Status: DC | PRN

## 2022-03-02 MED ORDER — HYDROXYCHLOROQUINE SULFATE 200 MG PO TABS
200.0000 mg | ORAL_TABLET | Freq: Every day | ORAL | Status: DC
  Administered 2022-03-03 – 2022-03-05 (×3): 200 mg via ORAL
  Filled 2022-03-02 (×3): qty 1

## 2022-03-02 MED ORDER — BUPIVACAINE HCL (PF) 0.5 % IJ SOLN
INTRAMUSCULAR | Status: DC | PRN
  Administered 2022-03-02: 10 mL via PERINEURAL

## 2022-03-02 MED ORDER — TRANEXAMIC ACID 1000 MG/10ML IV SOLN
INTRAVENOUS | Status: DC | PRN
  Administered 2022-03-02: 1000 mg

## 2022-03-02 MED ORDER — MOMETASONE FURO-FORMOTEROL FUM 100-5 MCG/ACT IN AERO
2.0000 | INHALATION_SPRAY | Freq: Two times a day (BID) | RESPIRATORY_TRACT | Status: DC
  Administered 2022-03-02 – 2022-03-05 (×6): 2 via RESPIRATORY_TRACT
  Filled 2022-03-02: qty 8.8

## 2022-03-02 MED ORDER — FUROSEMIDE 40 MG PO TABS
40.0000 mg | ORAL_TABLET | ORAL | Status: DC
  Administered 2022-03-03 – 2022-03-05 (×3): 40 mg via ORAL
  Filled 2022-03-02 (×3): qty 1

## 2022-03-02 MED ORDER — CEFAZOLIN SODIUM-DEXTROSE 2-4 GM/100ML-% IV SOLN
2.0000 g | Freq: Four times a day (QID) | INTRAVENOUS | Status: AC
  Administered 2022-03-02 – 2022-03-03 (×3): 2 g via INTRAVENOUS
  Filled 2022-03-02 (×2): qty 100

## 2022-03-02 MED ORDER — BUPIVACAINE LIPOSOME 1.3 % IJ SUSP
INTRAMUSCULAR | Status: DC | PRN
  Administered 2022-03-02: 20 mL via PERINEURAL

## 2022-03-02 MED ORDER — TRANEXAMIC ACID 1000 MG/10ML IV SOLN
INTRAVENOUS | Status: AC
  Filled 2022-03-02: qty 10

## 2022-03-02 MED ORDER — BUPIVACAINE LIPOSOME 1.3 % IJ SUSP
INTRAMUSCULAR | Status: AC
  Filled 2022-03-02: qty 10

## 2022-03-02 MED ORDER — TRAMADOL HCL 50 MG PO TABS: 50.0000 mg | ORAL_TABLET | Freq: Four times a day (QID) | ORAL | 0 refills | Status: DC | PRN

## 2022-03-02 MED ORDER — PROPOFOL 10 MG/ML IV BOLUS
INTRAVENOUS | Status: DC | PRN
  Administered 2022-03-02: 120 mg via INTRAVENOUS

## 2022-03-02 MED ORDER — METOCLOPRAMIDE HCL 5 MG/ML IJ SOLN: 5.0000 mg | Freq: Three times a day (TID) | INTRAMUSCULAR | Status: DC | PRN

## 2022-03-02 MED ORDER — SODIUM CHLORIDE 0.9 % IR SOLN
Status: DC | PRN
  Administered 2022-03-02: 3000 mL

## 2022-03-02 MED ORDER — LIDOCAINE HCL (PF) 2 % IJ SOLN
INTRAMUSCULAR | Status: AC
  Filled 2022-03-02: qty 5

## 2022-03-02 MED ORDER — FENTANYL CITRATE (PF) 100 MCG/2ML IJ SOLN
INTRAMUSCULAR | Status: AC
  Filled 2022-03-02: qty 2

## 2022-03-02 MED ORDER — PROPOFOL 10 MG/ML IV BOLUS
INTRAVENOUS | Status: AC
  Filled 2022-03-02: qty 20

## 2022-03-02 MED ORDER — SUGAMMADEX SODIUM 200 MG/2ML IV SOLN
INTRAVENOUS | Status: DC | PRN
  Administered 2022-03-02: 160 mg via INTRAVENOUS
  Administered 2022-03-02: 40 mg via INTRAVENOUS

## 2022-03-02 MED ORDER — LIDOCAINE HCL (PF) 1 % IJ SOLN
INTRAMUSCULAR | Status: DC | PRN
  Administered 2022-03-02: 4 mL via SUBCUTANEOUS

## 2022-03-02 MED ORDER — 0.9 % SODIUM CHLORIDE (POUR BTL) OPTIME
TOPICAL | Status: DC | PRN
  Administered 2022-03-02: 500 mL

## 2022-03-02 MED ORDER — ACETAMINOPHEN 10 MG/ML IV SOLN
INTRAVENOUS | Status: DC | PRN
  Administered 2022-03-02: 1000 mg via INTRAVENOUS

## 2022-03-02 MED ORDER — PHENYLEPHRINE HCL (PRESSORS) 10 MG/ML IV SOLN
INTRAVENOUS | Status: DC | PRN
  Administered 2022-03-02 (×3): 80 ug via INTRAVENOUS

## 2022-03-02 MED ORDER — ACETAMINOPHEN 10 MG/ML IV SOLN
INTRAVENOUS | Status: AC
  Filled 2022-03-02: qty 100

## 2022-03-02 MED ORDER — LACTATED RINGERS IV SOLN: INTRAVENOUS | Status: DC | PRN

## 2022-03-02 MED ORDER — CEFAZOLIN SODIUM-DEXTROSE 2-4 GM/100ML-% IV SOLN
INTRAVENOUS | Status: AC
  Filled 2022-03-02: qty 100

## 2022-03-02 MED ORDER — CEFAZOLIN SODIUM-DEXTROSE 2-4 GM/100ML-% IV SOLN
INTRAVENOUS | Status: AC
  Administered 2022-03-02: 2 g via INTRAVENOUS
  Filled 2022-03-02: qty 100

## 2022-03-02 MED ORDER — MIDAZOLAM HCL 2 MG/2ML IJ SOLN: 1.0000 mg | INTRAMUSCULAR | Status: DC | PRN

## 2022-03-02 MED ORDER — BUPIVACAINE-EPINEPHRINE (PF) 0.5% -1:200000 IJ SOLN
INTRAMUSCULAR | Status: AC
  Filled 2022-03-02: qty 30

## 2022-03-02 MED ORDER — ONDANSETRON HCL 4 MG/2ML IJ SOLN
INTRAMUSCULAR | Status: DC | PRN
  Administered 2022-03-02: 4 mg via INTRAVENOUS

## 2022-03-02 MED ORDER — ALLOPURINOL 100 MG PO TABS
100.0000 mg | ORAL_TABLET | ORAL | Status: DC
  Administered 2022-03-03 – 2022-03-05 (×3): 100 mg via ORAL
  Filled 2022-03-02 (×3): qty 1

## 2022-03-02 MED ORDER — ONDANSETRON HCL 4 MG/2ML IJ SOLN: 4.0000 mg | Freq: Once | INTRAMUSCULAR | Status: DC | PRN

## 2022-03-02 MED ORDER — PHENYLEPHRINE HCL (PRESSORS) 10 MG/ML IV SOLN
INTRAVENOUS | Status: AC
  Filled 2022-03-02: qty 1

## 2022-03-02 MED ORDER — LOSARTAN POTASSIUM 50 MG PO TABS
100.0000 mg | ORAL_TABLET | ORAL | Status: DC
  Administered 2022-03-03 – 2022-03-05 (×3): 100 mg via ORAL
  Filled 2022-03-02 (×3): qty 2

## 2022-03-02 MED ORDER — LIDOCAINE HCL (CARDIAC) PF 100 MG/5ML IV SOSY
PREFILLED_SYRINGE | INTRAVENOUS | Status: DC | PRN
  Administered 2022-03-02: 80 mg via INTRAVENOUS

## 2022-03-02 MED ORDER — FLEET ENEMA 7-19 GM/118ML RE ENEM: 1.0000 | ENEMA | Freq: Once | RECTAL | Status: DC | PRN

## 2022-03-02 MED ORDER — ROCURONIUM BROMIDE 100 MG/10ML IV SOLN
INTRAVENOUS | Status: DC | PRN
  Administered 2022-03-02: 10 mg via INTRAVENOUS
  Administered 2022-03-02: 20 mg via INTRAVENOUS

## 2022-03-02 MED ORDER — SUCCINYLCHOLINE CHLORIDE 200 MG/10ML IV SOSY
PREFILLED_SYRINGE | INTRAVENOUS | Status: AC
  Filled 2022-03-02: qty 10

## 2022-03-02 MED ORDER — ACETAMINOPHEN 325 MG PO TABS
325.0000 mg | ORAL_TABLET | Freq: Four times a day (QID) | ORAL | Status: DC | PRN
  Administered 2022-03-03: 650 mg via ORAL
  Filled 2022-03-02: qty 2

## 2022-03-02 MED ORDER — ACETAMINOPHEN 500 MG PO TABS
500.0000 mg | ORAL_TABLET | Freq: Four times a day (QID) | ORAL | Status: AC
  Administered 2022-03-02 – 2022-03-03 (×4): 500 mg via ORAL
  Filled 2022-03-02 (×5): qty 1

## 2022-03-02 MED ORDER — SUCCINYLCHOLINE CHLORIDE 200 MG/10ML IV SOSY
PREFILLED_SYRINGE | INTRAVENOUS | Status: DC | PRN
  Administered 2022-03-02: 120 mg via INTRAVENOUS

## 2022-03-02 MED ORDER — ONDANSETRON HCL 4 MG/2ML IJ SOLN: 4.0000 mg | Freq: Four times a day (QID) | INTRAMUSCULAR | Status: DC | PRN

## 2022-03-02 MED ORDER — METOCLOPRAMIDE HCL 10 MG PO TABS: 5.0000 mg | ORAL_TABLET | Freq: Three times a day (TID) | ORAL | Status: DC | PRN

## 2022-03-02 MED ORDER — CLOPIDOGREL BISULFATE 75 MG PO TABS
75.0000 mg | ORAL_TABLET | Freq: Every day | ORAL | Status: DC
  Administered 2022-03-03 – 2022-03-05 (×3): 75 mg via ORAL
  Filled 2022-03-02 (×3): qty 1

## 2022-03-02 MED ORDER — DOCUSATE SODIUM 100 MG PO CAPS
100.0000 mg | ORAL_CAPSULE | Freq: Two times a day (BID) | ORAL | Status: DC
  Administered 2022-03-02 – 2022-03-05 (×6): 100 mg via ORAL
  Filled 2022-03-02 (×6): qty 1

## 2022-03-02 MED ORDER — GABAPENTIN 100 MG PO CAPS: 100.0000 mg | ORAL_CAPSULE | Freq: Every evening | ORAL | Status: DC | PRN

## 2022-03-02 MED ORDER — BUPIVACAINE-EPINEPHRINE 0.5% -1:200000 IJ SOLN
INTRAMUSCULAR | Status: DC | PRN
  Administered 2022-03-02: 30 mL

## 2022-03-02 MED ORDER — LIDOCAINE HCL (PF) 1 % IJ SOLN
INTRAMUSCULAR | Status: AC
  Filled 2022-03-02: qty 5

## 2022-03-02 MED ORDER — FENTANYL CITRATE PF 50 MCG/ML IJ SOSY: 50.0000 ug | PREFILLED_SYRINGE | INTRAMUSCULAR | Status: DC | PRN

## 2022-03-02 MED ORDER — ROSUVASTATIN CALCIUM 20 MG PO TABS
40.0000 mg | ORAL_TABLET | ORAL | Status: DC
  Administered 2022-03-03 – 2022-03-05 (×3): 40 mg via ORAL
  Filled 2022-03-02 (×3): qty 2

## 2022-03-02 MED ORDER — MIDAZOLAM HCL 2 MG/2ML IJ SOLN
INTRAMUSCULAR | Status: AC
  Administered 2022-03-02: 1 mg via INTRAVENOUS
  Filled 2022-03-02: qty 2

## 2022-03-02 MED ORDER — INSULIN ASPART 100 UNIT/ML IJ SOLN
0.0000 [IU] | Freq: Three times a day (TID) | INTRAMUSCULAR | Status: DC
  Administered 2022-03-03: 8 [IU] via SUBCUTANEOUS
  Administered 2022-03-03: 5 [IU] via SUBCUTANEOUS
  Administered 2022-03-04 (×2): 3 [IU] via SUBCUTANEOUS
  Administered 2022-03-04 – 2022-03-05 (×2): 2 [IU] via SUBCUTANEOUS
  Filled 2022-03-02 (×4): qty 1

## 2022-03-02 MED ORDER — TRAMADOL HCL 50 MG PO TABS
50.0000 mg | ORAL_TABLET | Freq: Four times a day (QID) | ORAL | Status: DC | PRN
  Administered 2022-03-04: 50 mg via ORAL
  Filled 2022-03-02: qty 1

## 2022-03-02 MED ORDER — FENTANYL CITRATE PF 50 MCG/ML IJ SOSY
PREFILLED_SYRINGE | INTRAMUSCULAR | Status: AC
  Administered 2022-03-02: 50 ug via INTRAVENOUS
  Filled 2022-03-02: qty 1

## 2022-03-02 MED ORDER — SODIUM CHLORIDE 0.9 % IV SOLN: INTRAVENOUS | Status: DC

## 2022-03-02 MED ORDER — MAGNESIUM HYDROXIDE 400 MG/5ML PO SUSP: 30.0000 mL | Freq: Every day | ORAL | Status: DC | PRN

## 2022-03-02 MED ORDER — INSULIN ASPART 100 UNIT/ML IJ SOLN
0.0000 [IU] | Freq: Every day | INTRAMUSCULAR | Status: DC
  Administered 2022-03-02: 4 [IU] via SUBCUTANEOUS
  Filled 2022-03-02: qty 1

## 2022-03-02 MED ORDER — MORPHINE SULFATE (PF) 2 MG/ML IV SOLN: 0.5000 mg | INTRAVENOUS | Status: DC | PRN

## 2022-03-02 MED ORDER — HYDRALAZINE HCL 50 MG PO TABS
100.0000 mg | ORAL_TABLET | Freq: Every day | ORAL | Status: DC
  Administered 2022-03-03 – 2022-03-05 (×3): 100 mg via ORAL
  Filled 2022-03-02 (×3): qty 2

## 2022-03-02 MED ORDER — ZOLPIDEM TARTRATE 5 MG PO TABS
5.0000 mg | ORAL_TABLET | Freq: Every evening | ORAL | Status: DC | PRN
  Administered 2022-03-02 – 2022-03-04 (×4): 5 mg via ORAL
  Filled 2022-03-02 (×3): qty 1

## 2022-03-02 MED ORDER — ONDANSETRON HCL 4 MG/2ML IJ SOLN
INTRAMUSCULAR | Status: AC
  Filled 2022-03-02: qty 2

## 2022-03-02 MED ORDER — ONDANSETRON HCL 4 MG PO TABS: 4.0000 mg | ORAL_TABLET | Freq: Four times a day (QID) | ORAL | Status: DC | PRN

## 2022-03-02 MED ORDER — BUPIVACAINE LIPOSOME 1.3 % IJ SUSP
INTRAMUSCULAR | Status: AC
  Filled 2022-03-02: qty 20

## 2022-03-02 MED ORDER — BISACODYL 10 MG RE SUPP: 10.0000 mg | Freq: Every day | RECTAL | Status: DC | PRN

## 2022-03-02 MED ORDER — ROCURONIUM BROMIDE 10 MG/ML (PF) SYRINGE
PREFILLED_SYRINGE | INTRAVENOUS | Status: AC
  Filled 2022-03-02: qty 10

## 2022-03-02 MED ORDER — LABETALOL HCL 200 MG PO TABS
200.0000 mg | ORAL_TABLET | Freq: Two times a day (BID) | ORAL | Status: DC
  Administered 2022-03-02 – 2022-03-05 (×6): 200 mg via ORAL
  Filled 2022-03-02 (×6): qty 1

## 2022-03-02 MED ORDER — SODIUM CHLORIDE FLUSH 0.9 % IV SOLN
INTRAVENOUS | Status: AC
  Filled 2022-03-02: qty 20

## 2022-03-02 MED ORDER — PHENYLEPHRINE HCL-NACL 20-0.9 MG/250ML-% IV SOLN
INTRAVENOUS | Status: DC | PRN
  Administered 2022-03-02: 20 ug/min via INTRAVENOUS

## 2022-03-02 MED ORDER — BUPIVACAINE HCL (PF) 0.5 % IJ SOLN
INTRAMUSCULAR | Status: AC
  Filled 2022-03-02: qty 10

## 2022-03-02 MED ORDER — DIPHENHYDRAMINE HCL 12.5 MG/5ML PO ELIX: 12.5000 mg | ORAL_SOLUTION | ORAL | Status: DC | PRN

## 2022-03-02 SURGICAL SUPPLY — 67 items
APL PRP STRL LF DISP 70% ISPRP (MISCELLANEOUS) ×2
BIT DRILL FLUTED 3.0 STRL (BIT) IMPLANT
BLADE SAW SAG 25X90X1.19 (BLADE) ×3 IMPLANT
BSPLAT GLND +2X24 MDLR (Joint) ×2 IMPLANT
CALIBRATOR GLENOID VIP 5-D (SYSTAGENIX WOUND MANAGEMENT) IMPLANT
CHLORAPREP W/TINT 26 (MISCELLANEOUS) ×3 IMPLANT
COOLER POLAR GLACIER W/PUMP (MISCELLANEOUS) ×3 IMPLANT
COVER BACK TABLE REUSABLE LG (DRAPES) ×3 IMPLANT
CUP SUT UNIV REVERS 36 NEUTRAL (Cup) IMPLANT
DRAPE 3/4 80X56 (DRAPES) ×3 IMPLANT
DRAPE INCISE IOBAN 66X45 STRL (DRAPES) ×3 IMPLANT
DRSG OPSITE POSTOP 4X8 (GAUZE/BANDAGES/DRESSINGS) ×3 IMPLANT
ELECT BLADE 6.5 EXT (BLADE) IMPLANT
ELECT CAUTERY BLADE 6.4 (BLADE) ×3 IMPLANT
ELECT REM PT RETURN 9FT ADLT (ELECTROSURGICAL) ×2
ELECTRODE REM PT RTRN 9FT ADLT (ELECTROSURGICAL) ×3 IMPLANT
GAUZE XEROFORM 1X8 LF (GAUZE/BANDAGES/DRESSINGS) ×3 IMPLANT
GLENOID UNI REV MOD 24 +2 LAT (Joint) IMPLANT
GLENOSPHERE 36 +4 LAT/24 (Joint) IMPLANT
GLOVE BIO SURGEON STRL SZ7.5 (GLOVE) ×12 IMPLANT
GLOVE BIO SURGEON STRL SZ8 (GLOVE) ×12 IMPLANT
GLOVE BIOGEL PI IND STRL 8 (GLOVE) ×6 IMPLANT
GLOVE SURG UNDER LTX SZ8 (GLOVE) ×3 IMPLANT
GOWN STRL REUS W/ TWL LRG LVL3 (GOWN DISPOSABLE) ×3 IMPLANT
GOWN STRL REUS W/ TWL XL LVL3 (GOWN DISPOSABLE) ×3 IMPLANT
GOWN STRL REUS W/TWL LRG LVL3 (GOWN DISPOSABLE) ×2
GOWN STRL REUS W/TWL XL LVL3 (GOWN DISPOSABLE) ×2
HOOD PEEL AWAY FLYTE STAYCOOL (MISCELLANEOUS) ×9 IMPLANT
INSERT HUMERAL 36 +6 (Shoulder) IMPLANT
IV NS IRRIG 3000ML ARTHROMATIC (IV SOLUTION) ×3 IMPLANT
KIT STABILIZATION SHOULDER (MISCELLANEOUS) ×3 IMPLANT
KIT TURNOVER KIT A (KITS) ×3 IMPLANT
MANIFOLD NEPTUNE II (INSTRUMENTS) ×3 IMPLANT
MASK FACE SPIDER DISP (MASK) ×3 IMPLANT
MAT ABSORB  FLUID 56X50 GRAY (MISCELLANEOUS) ×2
MAT ABSORB FLUID 56X50 GRAY (MISCELLANEOUS) ×3 IMPLANT
NDL MAYO CATGUT SZ1 (NEEDLE) IMPLANT
NDL SAFETY ECLIP 18X1.5 (MISCELLANEOUS) ×3 IMPLANT
NDL SPNL 20GX3.5 QUINCKE YW (NEEDLE) ×3 IMPLANT
NEEDLE MAYO CATGUT SZ1 (NEEDLE) IMPLANT
NEEDLE SPNL 20GX3.5 QUINCKE YW (NEEDLE) ×2 IMPLANT
NS IRRIG 500ML POUR BTL (IV SOLUTION) ×3 IMPLANT
PACK ARTHROSCOPY SHOULDER (MISCELLANEOUS) ×3 IMPLANT
PAD ARMBOARD 7.5X6 YLW CONV (MISCELLANEOUS) ×3 IMPLANT
PAD WRAPON POLAR SHDR UNIV (MISCELLANEOUS) ×3 IMPLANT
PIN NITINOL TARGETER 2.8 (PIN) IMPLANT
PULSAVAC PLUS IRRIG FAN TIP (DISPOSABLE) ×2
SCREW CENTRAL MODULAR 25 (Screw) IMPLANT
SCREW PERI LOCK 5.5X16 (Screw) IMPLANT
SCREW PERI LOCK 5.5X32 (Screw) IMPLANT
SCREW PERIPHERAL 5.5X20 LOCK (Screw) IMPLANT
SLING ULTRA II M (MISCELLANEOUS) IMPLANT
SPONGE T-LAP 18X18 ~~LOC~~+RFID (SPONGE) ×6 IMPLANT
STAPLER SKIN PROX 35W (STAPLE) ×3 IMPLANT
STEM HUM UNIV REV 9 (Stem) IMPLANT
SUT ETHIBOND 0 MO6 C/R (SUTURE) ×3 IMPLANT
SUT FIBERWIRE #2 38 BLUE 1/2 (SUTURE) ×8
SUT VIC AB 0 CT1 36 (SUTURE) ×3 IMPLANT
SUT VIC AB 2-0 CT1 27 (SUTURE) ×4
SUT VIC AB 2-0 CT1 TAPERPNT 27 (SUTURE) ×6 IMPLANT
SUTURE FIBERWR #2 38 BLUE 1/2 (SUTURE) ×12 IMPLANT
SYR 10ML LL (SYRINGE) ×3 IMPLANT
SYR 30ML LL (SYRINGE) ×3 IMPLANT
TIP FAN IRRIG PULSAVAC PLUS (DISPOSABLE) ×3 IMPLANT
TRAP FLUID SMOKE EVACUATOR (MISCELLANEOUS) ×3 IMPLANT
WATER STERILE IRR 500ML POUR (IV SOLUTION) ×3 IMPLANT
WRAPON POLAR PAD SHDR UNIV (MISCELLANEOUS) ×2

## 2022-03-02 NOTE — Anesthesia Preprocedure Evaluation (Signed)
Anesthesia Evaluation  Patient identified by MRN, date of birth, ID band Patient awake    Reviewed: Allergy & Precautions, H&P , NPO status , Patient's Chart, lab work & pertinent test results  History of Anesthesia Complications (+) PROLONGED EMERGENCE and history of anesthetic complications  Airway Mallampati: III  TM Distance: <3 FB Neck ROM: limited    Dental  (+) Poor Dentition, Caps, Dental Advidsory Given, Implants   Pulmonary neg shortness of breath, sleep apnea , neg COPD, neg recent URI,    Pulmonary exam normal breath sounds clear to auscultation       Cardiovascular Exercise Tolerance: Good hypertension, (-) angina+ CAD, + Cardiac Stents and + Peripheral Vascular Disease  (-) Past MI Normal cardiovascular exam+ dysrhythmias (RBBB) + Valvular Problems/Murmurs  Rhythm:regular Rate:Normal     Neuro/Psych  Headaches, neg Seizures negative psych ROS   GI/Hepatic negative GI ROS, Neg liver ROS,   Endo/Other  diabetes, Type 2  Renal/GU CRFRenal disease  negative genitourinary   Musculoskeletal  (+) Arthritis ,   Abdominal   Peds  Hematology negative hematology ROS (+)   Anesthesia Other Findings Past Medical History: No date: Anemia of chronic renal disease No date: Arthritis 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               b.)07/18/2006 -endovas oblit complex ACOM aneur. c.)               Endovas Tx of 6.8x4mm29mCA aneur. d.) 3.5x3mm 73mnant               RMCA aneur 2/2 coil compaction. e.) Interval 3.2x3.1mm  37m          saccular outpouching in ACOM c/w mild recannulization in               neck. 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               g.) 3.7x2mm re59mnt of prev Tx'd ACOM aneur -failed embol              02/24/2012. No date: CKD (chronic kidney disease), stage IV (HCC)     Comment:  a.) solitary functioning kidney  on the RIGHT No date: Complication of anesthesia     Comment:  a.) delayed emergence 02/23/2007: Coronary artery disease     Comment:  a.) LHC 02/23/2007 --> EF 50%; 30% pLAD, 70% mRCA -->               planned for staged PCI. b.) PCI 02/27/2007: EF 60%; 3.5 x              15 mm Vision BMS to 80% mRCA. b.) LHC 07/24/2007: EF 60%;              minor irregs; no occlusive CAD; no intervention. c.) LHC               10/28/2010: EF 60%; 30% mLAD, LCx with minor luminal               irregs, 20% ISR mRCA; no intervention. d.) LHC               05/31/2013: EF 60%; 40% mLAD, 30% ISR m-dRCA; no               interventions. No date: Diastolic dysfunction     Comment:  a.)  TTE  07/07/2021: EF 87.8%; normal PASP; trace TR/MR;              G1DD. No date: Elevated hemidiaphragm     Comment:  a.) LEFT No date: Headache(784.0) No date: Heart murmur No date: HLD (hyperlipidemia) No date: HOH (hard of hearing)     Comment:  Has hearing aids, doesn't wear No date: Hyperparathyroidism due to renal insufficiency ([place]) No date: Hypertension No date: Incomplete right bundle branch block (RBBB) No date: Insomnia     Comment:  a.) takes zolpidem No date: Long term (current) use of immunomodulator     Comment:  a.) on DMARD therapy (hydroxychloriquine) for RA/SLE Dx. No date: Long term current use of antithrombotics/antiplatelets     Comment:  a.) clopidogrel No date: Multiple acquired cysts of kidney No date: Neck stiffness     Comment:  screws in neck.  Some limitations of movement No date: OSA (obstructive sleep apnea)     Comment:  a.) does NOT use nocturnal pap therapy No date: Pancreatic cyst No date: RBBB (right bundle branch block) No date: Rheumatoid arthritis ([place])     Comment:  a.) on DMARD; hydroxychloriquine No date: Systemic lupus erythematosus ([place]) No date: T2DM (type 2 diabetes mellitus) ([place]) No date: Wears dentures     Comment:  partial lower   Reproductive/Obstetrics negative  OB ROS                             Anesthesia Physical  Anesthesia Plan  ASA: 3  Anesthesia Plan: General   Post-op Pain Management:    Induction: Intravenous  PONV Risk Score and Plan: 3 and Ondansetron, Dexamethasone and Treatment may vary due to age or medical condition  Airway Management Planned: Oral ETT  Additional Equipment:   Intra-op Plan:   Post-operative Plan: Extubation in OR  Informed Consent: I have reviewed the patients History and Physical, chart, labs and discussed the procedure including the risks, benefits and alternatives for the proposed anesthesia with the patient or authorized representative who has indicated his/her understanding and acceptance.       Plan Discussed with: Anesthesiologist, CRNA and Surgeon  Anesthesia Plan Comments:         Anesthesia Quick Evaluation

## 2022-03-02 NOTE — H&P (Signed)
History of Present Illness: [name] is a 76 y.o. female who presents today for her surgical history and physical for upcoming right reverse total shoulder arthroplasty in addition to evaluation of cute onset of right knee pain. The patient is scheduled for a right reverse total shoulder arthroplasty with Dr. Roland Rack on 03/02/2022. The patient denies any changes in her medical history since she was last evaluated. She denies any personal history of MI or stroke but she does have a history of stent placement and also does report a history of brain aneurysm. The patient is a diabetic without any complications. The patient denies any history of asthma, COPD or blood clots. The patient continues report moderate discomfort in the right shoulder especially with range of motion activities. She reports the pain is an aching throbbing discomfort, she denies any numbness or ting into the right upper extremity. She is right-hand dominant.  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 Spondylolisthesis with spinal stenosis.  Carpal tunnel  Pancreatic cyst  SLE (systemic lupus erythematosus) (CMS-HCC)   Past Surgical History: ESOPHAGOGASTRODOUDENOSCOPY W/ULTRASOUND GUIDED ASPIRATION/BIOPSY N/A 08/31/2016  Procedure: Upper EUS; Surgeon: Vernie Ammons, MD; Location: [facility]; Service: Gastroenterology; Laterality: N/A;  ESOPHAGOGASTRODOUDENOSCOPY W/BIOPSY N/A 08/31/2016  Procedure: ESOPHAGOGASTRODUODENOSCOPY, FLEXIBLE, TRANSORAL; WITH BIOPSY,  SINGLE OR MULTIPLE; Surgeon: Vernie Ammons, MD; Location: [facility]; Service: Gastroenterology; Laterality: N/A;  BUNION CORRECTION Left 11/05/2020  Procedure: L - CORRECTION, HALLUX VALGUS (BUNIONECTOMY), WITH SESAMOIDECTOMY, WHEN PERFORMED; WITH RESECTION OF PROXIMAL PHALANX BASE, WHEN PERFORMED, ANY METHOD; Surgeon: Sharmon Revere, DPM; Location: [facility]; Service: Orthopedics; Laterality: Left;  FUSION GREAT TOE Left 11/05/2020  Procedure: L - ARTHRODESIS, GREAT TOE; METATARSOPHALANGEAL JOINT; Surgeon: Sharmon Revere, DPM; Location: [facility]; Service: Orthopedics; Laterality: Left;  APPLICATION LEG CAST Left 11/05/2020  Procedure: L - APPLICATION OF SHORT LEG CAST (BELOW KNEE TO TOE); Surgeon: Sharmon Revere, DPM; Location: [facility]; Service: Orthopedics; Laterality: Left;  BUNION CORRECTION Right 04/08/2021  Procedure: R-CORRECTION, HALLUX VALGUS (BUNIONECTOMY), WITH SESAMOIDECTOMY, WHEN PERFORMED; WITH RESECTION OF PROXIMAL PHALANX BASE, WHEN PERFORMED, ANY METHOD; Surgeon: Sharmon Revere, DPM; Location: [facility]; Service: Orthopedics; Laterality: Right;  FUSION GREAT TOE Right 04/08/2021  Procedure: R-ARTHRODESIS, GREAT TOE; METATARSOPHALANGEAL JOINT; Surgeon: Sharmon Revere, DPM; Location: [facility]; Service: Orthopedics; Laterality: Right;  APPLICATION LEG CAST Right 04/08/2021  Procedure: R-APPLICATION OF SHORT LEG CAST (BELOW KNEE TO TOE); WALKING OR AMBULATORY TYPE; Surgeon: Sharmon Revere, DPM; Location: [facility]; Service: Orthopedics; Laterality: Right;  ASPIRATION &/OR INJECTION SMALL JOINT BURSA/GANGLION CYST Left 04/08/2021  Procedure: R - ARTHROCENTESIS, ASPIRATION AND/OR INJECTION, SMALL JOINT OR BURSA, TOES; WITHOUT ULTRASOUND GUIDANCE; Surgeon: Sharmon Revere, DPM; Location: [facility]; Service: Orthopedics; Laterality: Left;  REMOVAL HARDWARE ANKLE/FOOT/TOES Bilateral 08/19/2021  Procedure: BIL - REMOVAL OF IMPLANT;  ANKLE/FOOT/TOE, DEEP (EG, BURIED WIRE, PIN, SCREW, METAL BAND, NAIL, ROD OR PLATE); Surgeon: Sharmon Revere, DPM; Location: [facility]; Service: Orthopedics; Laterality: Bilateral;  BACK SURGERY  C- Spine Surgery and L Spine Surgery  Brain Surgery for Aneurysm coiling 02/24/12 Unsuccessful  Cardiac Cath 07/2007  Cardiac Cath 05/31/13  Cardiac Cath 10/28/10  Cardiac cath  02/27/07  Cardiac Stent 02/2007  CATARACT EXTRACTION Left  CATARACT EXTRACTION Right  Cervical disc surgery  REPAIR ANEURYSM CAROTID CIRCULATION (INTRACRANIAL APPROACH)   Past Family History: Arthritis Mother  Heart disease Mother  Arthritis Father  Heart disease Father  Diabetes type II Sister  Heart disease Brother  Diabetes type II Brother   Medications: allopurinoL (ZYLOPRIM) 100 MG tablet TAKE 1 TABLET BY MOUTH EVERY DAY 90 tablet 1  blood glucose diagnostic test strip USE TO CHECK BLOOD SUGARS  budesonide-formoteroL (SYMBICORT) 80-4.5 mcg/actuation inhaler Inhale into the lungs  carvediloL (COREG) 25 MG tablet  clopidogreL (PLAVIX) 75 mg tablet Take 1 tablet by mouth once daily.  clotrimazole-betamethasone (LOTRISONE) 1-0.05 % cream Apply topically once daily  FUROsemide (LASIX) 20 MG tablet Take 40 mg by mouth once daily.  gabapentin (NEURONTIN) 300 MG capsule TAKE 1 CAPSULE BY MOUTH EVERYDAY AT BEDTIME 30 capsule 0  glimepiride (AMARYL) 2 MG tablet Take 3 mg by mouth 2 (two) times daily  hydrALAZINE (APRESOLINE) 50 MG tablet Take 50 mg by mouth 2 (two) times daily  hydrOXYchloroQUINE (PLAQUENIL) 200 mg tablet Take 1 tablet (200 mg total) by mouth 2 (two) times daily 60 tablet 5  labetaloL (TRANDATE) 200 MG tablet Take 300 mg by mouth 2 (two) times daily  losartan (COZAAR) 100 MG tablet Take 100 mg by mouth once daily  nitroGLYcerin (NITROSTAT) 0.4 MG SL tablet as needed  omeprazole (PRILOSEC) 40 MG DR capsule Take 40 mg by mouth once daily.  rosuvastatin (CRESTOR) 10 MG tablet Take 5 mg by mouth once daily.   tretinoin (RETIN-A) 0.05 % cream  zolpidem (AMBIEN) 5 MG tablet Take by mouth nightly.  allopurinoL (ZYLOPRIM) 300 MG tablet TAKE 1 TABLET BY MOUTH EVERY DAY (Patient not taking: Reported on 02/24/2022) 90 tablet 0  calcitRIOL (ROCALTROL) 0.25 MCG capsule TAKE 1 CAPSULE (0.25 MCG TOTAL) BY MOUTH EVERY OTHER DAY (Patient not taking: Reported on 02/24/2022)  ferrous gluconate (FERGON) 324 MG tablet (Patient not taking: Reported on 02/24/2022)  mupirocin (BACTROBAN) 2 % ointment APPLY TO BOTH NOSTRILS TWICE A DAY FOR 10 DAYS (Patient not taking: Reported on 02/24/2022)  pantoprazole (PROTONIX) 40 MG DR tablet (Patient not taking: Reported on 02/24/2022)  tiZANidine (ZANAFLEX) 4 MG tablet TAKE 0.5 TABLETS (2 MG TOTAL) BY MOUTH 3 (THREE) TIMES DAILY AS NEEDED FOR MUSCLE SPASM (Patient not taking: Reported on 02/24/2022) 30 tablet 1  valsartan (DIOVAN) 320 MG tablet (Patient not taking: Reported on 02/24/2022)   Allergies: Amlodipine Swelling  Sulfa (Sulfonamide Antibiotics) Itching  Sulfasalazine Itching  Isosorbide Dinitrate Other (Syncope and collapse) Oxycodone Itching    Review of Systems:  A comprehensive 14 point ROS was performed, reviewed by me today, and the pertinent orthopaedic findings are documented in the HPI.  Physical Exam: BP 126/78 (BP Location: Left upper arm, Patient Position: Sitting, BP Cuff Size: Large Adult)  Ht 160 cm ('5\' 3"'$ )  Wt 81.7 kg (180 lb 3.2 oz)  BMI 31.92 kg/m  General/Constitutional: The patient appears to be well-nourished, well-developed, and in no acute distress. Neuro/Psych: Normal mood and affect, oriented to person, place and time. Eyes: Non-icteric. Pupils are equal, round, and reactive to light, and exhibit synchronous movement. ENT: Unremarkable. Lymphatic: No palpable adenopathy. Respiratory: Lungs clear to auscultation, Normal chest excursion, No wheezes, and Non-labored breathing Cardiovascular: Regular rate and rhythm. No murmurs. and No edema, swelling  or tenderness, except as noted in detailed exam. Integumentary: No impressive skin lesions present, except as noted in detailed exam. Musculoskeletal:  Unremarkable, except as noted in detailed exam.  Right shoulder exam: SKIN: normal SWELLING: none WARMTH: none LYMPH NODES: no adenopathy palpable CREPITUS: none TENDERNESS: Mildly tender over anterolateral shoulder ROM (active):  Forward flexion: 130 degrees Abduction: 120 degrees Internal rotation: L1 ROM (passive):  Forward flexion: 140 degrees Abduction: 130 degrees ER/IR at 90 abd: 75 degrees / 65 degrees  She has mild pain at the extremes of all motions.  STRENGTH: Forward flexion: 4/5 Abduction: 4/5 External rotation: 4/5 Internal rotation: 4-4+/5 Pain with RC testing: Mild pain with resisted forward flexion more so than with resisted abduction  STABILITY: Normal  SPECIAL TESTS: Luan Pulling' test: positive, mild Speed's test: Equivocally positive Capsulitis - pain w/ passive ER: no Crossed arm test: Mildly positive Crank: Not evaluated Anterior apprehension: Negative Posterior apprehension: Not evaluated  She is neurovascularly intact to the right upper extremity.  Imaging: CT OF THE UPPER RIGHT EXTREMITY:  1. Severe osteoarthritis of the right glenohumeral joint.   Impression: Post-traumatic osteoarthritis of right shoulder.  Plan:  1. Treatment options were discussed today with the patient. 2. The patient is scheduled for a right reverse total shoulder arthroplasty with Dr. Roland Rack on 03/02/2022. The patient has been instructed to stop her Plavix at this time. 3. The patient was instructed on the risk and benefits of surgery and wishes to proceed at this time. This document will serve as a surgical history and physical for the patient. The patient was given a slingshot shoulder sling at today's visit which will be required after surgery. 4. The patient will follow-up per standard postop protocol. Did discuss that if  her right knee is bothering her at her first postop appointment we could proceed with a right knee steroid injection at this time 5. They can call the clinic they have any questions, new symptoms develop or symptoms worsen.  The procedure was discussed with the patient, as were the potential risks (including bleeding, infection, nerve and/or blood vessel injury, persistent or recurrent pain, failure of the hardware, dislocation, axial nerve injury, stress fracture, need for further surgery, blood clots, strokes, heart attacks and/or arhythmias, pneumonia, etc.) and benefits. The patient states her understanding and wishes to proceed.   H&P reviewed and patient re-examined. No changes.

## 2022-03-02 NOTE — Op Note (Signed)
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  Post-Op Diagnosis:   Same  Procedure:   Reverse right total shoulder arthroplasty with biceps tenodesis.  Surgeon:   Pascal Lux, MD  Assistant:   Cameron Proud, PA-C  Anesthesia:   General endotracheal with an interscalene block using Exparel placed preoperatively by the anesthesiologist.  Findings:   As above.  Complications:   None  EBL:   75 cc  Fluids:   700 cc crystalloid  UOP:   None  TT:   None  Drains:   None  Closure:   Staples  Implants:   All press-fit Arthrex system with a #9 Apex humeral stem, a 36 mm SutureCup with a +6 insert, a +2 mm offset 24 mm base plate with a 25 mm central screw, and a 36 mm  +4 lateralized glenosphere.  Brief Clinical Note:   The patient is a 76 year old female with a long history of progressive worsening right shoulder pain. Her symptoms have progressed despite medications, activity modification, etc. Her history and examination consistent with advanced degenerative joint disease. Preoperative MRI scanning demonstrated tendinopathy of both the rotator cuff and biceps tendon. The patient presents at this time for a reverse right total shoulder arthroplasty.  Procedure:   The patient underwent placement of an interscalene block using Exparel by the anesthesiologist in the preoperative holding area before being brought into the operating room and lain in the supine position. The patient then underwent general endotracheal intubation and anesthesia before the patient was repositioned in the beach chair position using the beach chair positioner. The right shoulder and upper extremity were prepped with ChloraPrep solution before being draped sterilely. Preoperative antibiotics were administered. A timeout was performed to verify the appropriate surgical site.     A standard anterior approach to the shoulder was made through an approximately 4-5 inch incision. The incision was carried down through the subcutaneous tissues to expose the deltopectoral fascia. The interval between the deltoid and pectoralis muscles was identified and this plane developed, retracting the cephalic vein laterally with the deltoid muscle. The conjoined tendon was identified. Its lateral margin was dissected and the Kolbel self-retraining retractor inserted. The "three sisters" were identified and cauterized. Bursal tissues were removed to improve visualization.   The biceps tendon was identified near the inferior aspect of the bicipital groove. A soft tissue tenodesis was performed by attaching the biceps tendon to the adjacent pectoralis major tendon using two #0 Ethibond interrupted sutures. The biceps tendon was then transected just proximal to the tenodesis site. The subscapularis tendon was released from its attachment to the lesser tuberosity 1 cm proximal to its insertion and several tagging sutures placed. The inferior capsule was released with care after identifying and protecting the axillary nerve. The proximal humeral cut was made at approximately 30 of retroversion using the extra-medullary guide.   Attention was redirected to the glenoid. The labrum was debrided circumferentially before the center of the glenoid was marked with electrocautery. The guidewire was drilled into the glenoid neck using the appropriate guide. After verifying its position, it was overreamed with the baseplate reamer to create a flat surface before the peripheral reamer was introduced to clean off any peripheral soft tissues. The central 10 mm coring reamer was then inserted before the hole was tapped to complete the glenoid preparation. The permanent mini-baseplate construct with a 25  mm screw attachment was screwed into place and tightened securely. The baseplate was then further secured using four  peripheral locking screws. The permanent 36 mm +4 mm lateralized glenosphere was then impacted into place and its Morse taper locking mechanism verified using manual distraction. Finally, the glenosphere locking screw was inserted and tightened securely.  Attention was directed to the humeral side. The humeral canal was reamed with first the 5 mm and then the 6 mm reamer. The canal was broached beginning with a #5 broach and progressing to a #9 broach. This was left in place and the metaphyseal inset reamer was used to create the metaphyseal socket.  A trial reduction performed using the 36 mm trial humeral platform with first the +3 mm insert and then the +6 mm insert. With the +6 mm insert, the arm demonstrated excellent range of motion as the hand could be brought across the chest to the opposite shoulder and brought to the top of the patient's head and to the patient's ear. The shoulder appeared stable throughout this range of motion. The joint was dislocated and the trial components removed. The permanent #9 Apex stem with the permanent 36 mm SutureCup humeral platform was put together on the back table before it was impacted into place.  A repeat trial reduction was performed using the +6 mm insert with the findings as described above. Therefore, the permanent +6 mm insert was snapped into place. The shoulder was relocated using two finger pressure and again placed through a range of motion with the findings as described above.  The wound was copiously irrigated with sterile saline solution using the jet lavage system before a total of 30 cc of 0.5% Sensorcaine with epinephrine was injected into the pericapsular and peri-incisional tissues to help with postoperative analgesia. The subscapularis tendon was reapproximated using #2 FiberWire interrupted sutures. The deltopectoral interval was closed using #0 Vicryl interrupted sutures before the subcutaneous tissues were closed using 2-0 Vicryl interrupted  sutures. The skin was closed using staples. Prior to closing the skin, 1 g of transexemic acid in 10 cc of normal saline was injected intra-articularly to help with postoperative bleeding. A sterile occlusive dressing was applied to the wound before the arm was placed into a shoulder immobilizer with an abduction pillow. A Polar Care system also was applied to the shoulder. The patient was then transferred back to a hospital bed before being awakened, extubated, and returned to the recovery room in satisfactory condition after tolerating the procedure well.

## 2022-03-02 NOTE — Discharge Instructions (Addendum)
Orthopedic discharge instructions: May shower with intact OpSite dressing once nerve block has worn off (Saturday).  Apply ice frequently to shoulder or use Polar Care device. Resume gabapentin as needed for pain. May supplement with ES Tylenol or tramadol if necessary. Resume Plavix tomorrow morning. Keep shoulder immobilizer on at all times except may remove for bathing purposes. Follow-up in 10-14 days or as scheduled.  AMBULATORY SURGERY  DISCHARGE INSTRUCTIONS   The drugs that you were given will stay in your system until tomorrow so for the next 24 hours you should not:  Drive an automobile Make any legal decisions Drink any alcoholic beverage   You may resume regular meals tomorrow.  Today it is better to start with liquids and gradually work up to solid foods.  You may eat anything you prefer, but it is better to start with liquids, then soup and crackers, and gradually work up to solid foods.   Please notify your doctor immediately if you have any unusual bleeding, trouble breathing, redness and pain at the surgery site, drainage, fever, or pain not relieved by medication.    Additional Instructions:        Please contact your physician with any problems or Same Day Surgery at [phone], Monday through Friday 6 am to 4 pm, or [place] at [facility] number at [phone].     POLAR CARE INFORMATION  [email or web address]  How to use [place] Cold Therapy System?  YouTube   [email or web address]  OPERATING INSTRUCTIONS  Start the product With dry hands, connect the transformer to the electrical connection located on the top of the cooler. Next, plug the transformer into an appropriate electrical outlet. The unit will automatically start running at this point.  To stop the pump, disconnect electrical power.  Unplug to stop the product when not in use. Unplugging the Polar Care unit turns it off. Always unplug  immediately after use. Never leave it plugged in while unattended. Remove pad.    FIRST ADD WATER TO FILL LINE, THEN ICE---Replace ice when existing ice is almost melted  1 Discuss Treatment with your [place] Practitioner and Use Only as Prescribed 2 Apply Insulation Barrier & Cold Therapy Pad 3 Check for Moisture 4 Inspect Skin Regularly  Tips and Trouble Shooting Usage Tips 1. Use cubed or chunked ice for optimal performance. 2. It is recommended to drain the Pad between uses. To drain the pad, hold the Pad upright with the hose pointed toward the ground. Depress the black plunger and allow water to drain out. 3. You may disconnect the Pad from the unit without removing the pad from the affected area by depressing the silver tabs on the hose coupling and gently pulling the hoses apart. The Pad and unit will seal itself and will not leak. Note: Some dripping during release is normal. 4. DO NOT RUN PUMP WITHOUT WATER! The pump in this unit is designed to run with water. Running the unit without water will cause permanent damage to the pump. 5. Unplug unit before removing lid.  TROUBLESHOOTING GUIDE Pump not running, Water not flowing to the pad, Pad is not getting cold 1. Make sure the transformer is plugged into the wall outlet. 2. Confirm that the ice and water are filled to the indicated levels. 3. Make sure there are no kinks in the pad. 4. Gently pull on the blue tube to make sure the tube/pad junction is straight. 5. Remove the pad from the treatment site and ll  it while the pad is lying at; then reapply. 6. Confirm that the pad couplings are securely attached to the unit. Listen for the double clicks (Figure 1) to confirm the pad couplings are securely attached.  Leaks    Note: Some condensation on the lines, controller, and pads is unavoidable, especially in warmer climates. 1. If using a [place] Polar Care Cold Therapy unit with a detachable Cold Therapy Pad, and a leak  exists (other than condensation on the lines) disconnect the pad couplings. Make sure the silver tabs on the couplings are depressed before reconnecting the pad to the pump hose; then confirm both sides of the coupling are properly clicked in. 2. If the coupling continues to leak or a leak is detected in the pad itself, stop using it and call [facility] at [phone].  Cleaning After use, empty and dry the unit with a soft cloth. Warm water and mild detergent may be used occasionally to clean the pump and tubes.  WARNING: The [place] can be cold enough to cause serious injury, including full skin necrosis. Follow these Operating Instructions, and carefully read the Product Insert (see pouch on side of unit) and the Cold Therapy Pad Fitting Instructions (provided with each Cold Therapy Pad) prior to use.

## 2022-03-02 NOTE — Transfer of Care (Signed)
Immediate Anesthesia Transfer of Care Note  Patient: [name]  Procedure(s) Performed: REVERSE SHOULDER ARTHROPLASTY (Right: Shoulder) BICEPS TENODESIS (Right)  Patient Location: PACU  Anesthesia Type:General  Level of Consciousness: awake  Airway & Oxygen Therapy: Patient Spontanous Breathing and Patient connected to face mask oxygen  Post-op Assessment: Report given to RN and Post -op Vital signs reviewed and stable  Post vital signs: Reviewed and stable  Last Vitals:  Vitals Value Taken Time  BP 120/58 03/02/22 1311  Temp    Pulse 73 03/02/22 1315  Resp 18 03/02/22 1315  SpO2 93 % 03/02/22 1315  Vitals shown include unvalidated device data.  Last Pain:  Vitals:   03/02/22 0949  TempSrc:   PainSc: 0-No pain         Complications: No notable events documented.

## 2022-03-02 NOTE — Anesthesia Procedure Notes (Signed)
Procedure Name: Intubation Date/Time: 03/02/2022 10:37 AM  Performed by: Fredderick Phenix, CRNAPre-anesthesia Checklist: Patient identified, Emergency Drugs available, Suction available and Patient being monitored Patient Re-evaluated:Patient Re-evaluated prior to induction Oxygen Delivery Method: Circle system utilized Preoxygenation: Pre-oxygenation with 100% oxygen Induction Type: IV induction Ventilation: Mask ventilation without difficulty Laryngoscope Size: McGraph and 3 Grade View: Grade I Tube type: Oral Tube size: 7.0 mm Number of attempts: 1 Airway Equipment and Method: Stylet Placement Confirmation: ETT inserted through vocal cords under direct vision, positive ETCO2 and breath sounds checked- equal and bilateral Secured at: 21 cm Tube secured with: Tape Dental Injury: Teeth and Oropharynx as per pre-operative assessment  Comments: Lauren Cozart, SRNA placed ETT under supervision.

## 2022-03-02 NOTE — Anesthesia Procedure Notes (Signed)
Anesthesia Regional Block: Interscalene brachial plexus block   Pre-Anesthetic Checklist: , timeout performed,  Correct Patient, Correct Site, Correct Laterality,  Correct Procedure, Correct Position, site marked,  Risks and benefits discussed,  Surgical consent,  Pre-op evaluation,  At surgeon's request and post-op pain management  Laterality: Right and Upper  Prep: chloraprep       Needles:  Injection technique: Single-shot  Needle Type: Stimiplex     Needle Length: 5cm  Needle Gauge: 22     Additional Needles:   Procedures:, nerve stimulator,,,,,    Narrative:  Start time: 03/02/2022 9:44 AM End time: 03/02/2022 9:48 AM Injection made incrementally with aspirations every 5 mL.  Performed by: Personally  Anesthesiologist: Martha Clan, MD  Additional Notes: Functioning IV was confirmed and monitors were applied.  A 71m 22ga Stimuplex needle was used. Sterile prep and drape,hand hygiene and sterile gloves were used.  Negative aspiration and negative test dose prior to incremental administration of local anesthetic. The patient tolerated the procedure well.

## 2022-03-02 NOTE — Evaluation (Signed)
Occupational Therapy Evaluation Patient Details Name: [name] MRN: [id number] DOB: 24-Jan-1946 Today's Date: 03/02/2022   History of Present Illness 76yo female s/p reverse TSA with bicep tenodesis on 03/02/22.   Clinical Impression   Patient was seen for an OT evaluation this date. Pt lives with her daughter in a 2nd fl apartment with no elevator access, 16 steps with L/R rails. Prior to surgery, pt was independent with ADL, driving, and used SPC w R hand at baseline. Pt has orders for RUE to be immobilized and will be NWBing per MD. Patient presents with impaired strength/ROM, sensation to RUE, and impaired balance. These impairments result in a decreased ability to perform self care tasks requiring MOD-MAX A for UB ADL and MIN A for LB dressing and bathing, and MAX A for application of polar care and sling/immobilizer. Pt/dtr instructed in polar care mgt, sling/immobilizer mgt, ROM and RUE precautions, adaptive strategies for bathing/dressing/toileting/grooming, positioning and considerations for sleep, and home/routines modifications to maximize falls prevention, safety, and independence. Handout provided. OT adjusted sling/immobilizer and polar care to improve comfort, optimize positioning, and to maximize skin integrity/safety. Pt verbalized understanding of all education/training provided. Pt ambulated ~40' with CGA using SPC in L hand, endorsing increasing wooziness/dizziness. Attempted 1 step and then deferred additional mobility. RN and MD notified. Pt will benefit from skilled OT services while hospitalized to address these limitations and improve independence in daily tasks. Recommend HHOT services to continue therapy to maximize return to PLOF, address home/routines modifications and safety, minimize falls risk, and minimize caregiver burden.      Recommendations for follow up therapy are one component of a multi-disciplinary discharge planning process, led by the attending  physician.  Recommendations may be updated based on patient status, additional functional criteria and insurance authorization.   Follow Up Recommendations  Home health OT    Assistance Recommended at Discharge Frequent or constant Supervision/Assistance  Patient can return home with the following A little help with walking and/or transfers;A lot of help with bathing/dressing/bathroom;Assistance with cooking/housework;Help with stairs or ramp for entrance;Assist for transportation    Functional Status Assessment  Patient has had a recent decline in their functional status and demonstrates the ability to make significant improvements in function in a reasonable and predictable amount of time.  Equipment Recommendations  BSC/3in1    Recommendations for Other Services       Precautions / Restrictions Precautions Precautions: Shoulder;Fall Shoulder Interventions: Don joy ultra sling;Shoulder abduction pillow;At all times;Off for dressing/bathing/exercises Precaution Booklet Issued: Yes (comment) Restrictions Weight Bearing Restrictions: Yes RUE Weight Bearing: Non weight bearing      Mobility Bed Mobility               General bed mobility comments: NT, in recliner at start and end of session    Transfers Overall transfer level: Needs assistance Equipment used: Straight cane Transfers: Sit to/from Stand Sit to Stand: Min guard                  Balance Overall balance assessment: Needs assistance Sitting-balance support: No upper extremity supported, Feet supported Sitting balance-Leahy Scale: Fair     Standing balance support: Single extremity supported Standing balance-Leahy Scale: Fair Standing balance comment: unsteady but no overt LOB                           ADL either performed or assessed with clinical judgement   ADL Overall ADL's : Needs assistance/impaired  General ADL Comments:  Pt currently requires CGA for ADL transfers with SPC, set up and MIN A for LB dressing, and MOD-MAX A for UB dressing. MAX A for sling and polar care mgt.     Vision         Perception     Praxis      Pertinent Vitals/Pain Pain Assessment Pain Assessment: No/denies pain     Hand Dominance Right   Extremity/Trunk Assessment Upper Extremity Assessment Upper Extremity Assessment: RUE deficits/detail RUE Deficits / Details: block still working RUE: Unable to fully assess due to immobilization RUE Sensation: decreased proprioception;decreased light touch RUE Coordination: decreased fine motor;decreased gross motor   Lower Extremity Assessment Lower Extremity Assessment: Generalized weakness (hx L hip pain stemming from low back issues per pt)       Communication Communication Communication: No difficulties   Cognition Arousal/Alertness: Awake/alert Behavior During Therapy: WFL for tasks assessed/performed Overall Cognitive Status: Within Functional Limits for tasks assessed                                 General Comments: Pt follows all commands well, does endorse feeling a little out of it/woozy/dizzy with standing/mobility     General Comments       Exercises Other Exercises Other Exercises: Pt/dtr educated in shoulder precautions, how to maintain, positioning, sling mgt, polar care mgt, falls prevention, home/routines modifications and AE/DME Other Exercises: Pt ambulated ~40' with slow shuffled steps using SPC in L hand (non-dom) and endorsed feeling unsteady throughout. No overt LOB but did appear unsteady. Attempted 1 step with L hand rail which pt was able to complete with CGA but additional steps deferred as pt felt increasingly woozy/dizzy/out of it.   Shoulder Instructions      Home Living Family/patient expects to be discharged to:: Private residence Living Arrangements: Children Available Help at Discharge: Family;Available  PRN/intermittently (daughter works) Type of Home: Apartment Home Access: Stairs to enter [employer] of Steps: 16 steps to 2nd floor apartment, no elevator access Entrance Stairs-Rails: Right;Left Home Layout: One level     Bathroom Shower/Tub: Teacher, early years/pre: Standard     Home Equipment: Cane - single point;Shower seat          Prior Functioning/Environment Prior Level of Function : Independent/Modified Independent             Mobility Comments: ambulates with SPC using R hand ADLs Comments: indep with ADL, drives, 1 fall/"slipped" in past 36mo daughter helps with cooking        OT Problem List: Decreased strength;Decreased coordination;Decreased range of motion;Impaired sensation;Decreased activity tolerance;Impaired balance (sitting and/or standing);Decreased knowledge of use of DME or AE;Impaired UE functional use;Decreased knowledge of precautions      OT Treatment/Interventions: Self-care/ADL training;Therapeutic exercise;Therapeutic activities;DME and/or AE instruction;Patient/family education;Balance training    OT Goals(Current goals can be found in the care plan section) Acute Rehab OT Goals Patient Stated Goal: feel better and go home OT Goal Formulation: With patient/family Time For Goal Achievement: 03/16/22 Potential to Achieve Goals: Good ADL Goals Pt Will Transfer to Toilet: with modified independence;ambulating (LRAD) Pt Will Perform Toileting - Clothing Manipulation and hygiene: with modified independence Additional ADL Goal #1: Pt/family will demo independence with shoulder sling mgt. Additional ADL Goal #2: Pt/family will demo independence with polar care mgt. Additional ADL Goal #3: Pt will get from her recliner to the bathroom and back with supervision,  using LRAD, and with no LOB for toileting.  OT Frequency: Min 2X/week    Co-evaluation              AM-PAC OT "6 Clicks" Daily Activity     Outcome Measure  Help from another person eating meals?: None Help from another person taking care of personal grooming?: A Little Help from another person toileting, which includes using toliet, bedpan, or urinal?: A Little Help from another person bathing (including washing, rinsing, drying)?: A Lot Help from another person to put on and taking off regular upper body clothing?: A Lot Help from another person to put on and taking off regular lower body clothing?: A Little 6 Click Score: 17   End of Session Equipment Utilized During Treatment: Gait belt;Other (comment) (shoulder sling, SPC) Nurse Communication: Mobility status  Activity Tolerance: Other (comment) (woozy, dizzy) Patient left: in chair;with call bell/phone within reach;with nursing/sitter in room  OT Visit Diagnosis: Other abnormalities of gait and mobility (R26.89);History of falling (Z91.81);Muscle weakness (generalized) (M62.81)                Time: 9449-6759 OT Time Calculation (min): 59 min Charges:  OT General Charges $OT Visit: 1 Visit OT Evaluation $OT Eval Moderate Complexity: 1 Mod OT Treatments $Self Care/Home Management : 53-67 mins  Ardeth Perfect., MPH, MS, OTR/L ascom [phone] 03/02/22, 5:00 PM

## 2022-03-03 ENCOUNTER — Observation Stay: Payer: Medicare Other

## 2022-03-03 ENCOUNTER — Encounter: Payer: Self-pay | Admitting: Surgery

## 2022-03-03 DIAGNOSIS — R0602 Shortness of breath: Secondary | ICD-10-CM | POA: Diagnosis not present

## 2022-03-03 DIAGNOSIS — Z96611 Presence of right artificial shoulder joint: Secondary | ICD-10-CM | POA: Diagnosis not present

## 2022-03-03 DIAGNOSIS — E1122 Type 2 diabetes mellitus with diabetic chronic kidney disease: Secondary | ICD-10-CM | POA: Diagnosis not present

## 2022-03-03 DIAGNOSIS — I1310 Hypertensive heart and chronic kidney disease without heart failure, with stage 1 through stage 4 chronic kidney disease, or unspecified chronic kidney disease: Secondary | ICD-10-CM | POA: Diagnosis not present

## 2022-03-03 DIAGNOSIS — Z7902 Long term (current) use of antithrombotics/antiplatelets: Secondary | ICD-10-CM | POA: Diagnosis not present

## 2022-03-03 DIAGNOSIS — M7522 Bicipital tendinitis, left shoulder: Secondary | ICD-10-CM | POA: Diagnosis not present

## 2022-03-03 DIAGNOSIS — K625 Hemorrhage of anus and rectum: Secondary | ICD-10-CM | POA: Diagnosis not present

## 2022-03-03 DIAGNOSIS — M75101 Unspecified rotator cuff tear or rupture of right shoulder, not specified as traumatic: Secondary | ICD-10-CM | POA: Diagnosis not present

## 2022-03-03 DIAGNOSIS — R9389 Abnormal findings on diagnostic imaging of other specified body structures: Secondary | ICD-10-CM | POA: Diagnosis not present

## 2022-03-03 DIAGNOSIS — I2510 Atherosclerotic heart disease of native coronary artery without angina pectoris: Secondary | ICD-10-CM | POA: Diagnosis not present

## 2022-03-03 DIAGNOSIS — Z79899 Other long term (current) drug therapy: Secondary | ICD-10-CM | POA: Diagnosis not present

## 2022-03-03 DIAGNOSIS — N184 Chronic kidney disease, stage 4 (severe): Secondary | ICD-10-CM | POA: Diagnosis not present

## 2022-03-03 LAB — BASIC METABOLIC PANEL
Anion gap: 8 (ref 5–15)
BUN: 51 mg/dL — ABNORMAL HIGH (ref 8–23)
CO2: 27 mmol/L (ref 22–32)
Calcium: 8.7 mg/dL — ABNORMAL LOW (ref 8.9–10.3)
Chloride: 109 mmol/L (ref 98–111)
Creatinine, Ser: 2.59 mg/dL — ABNORMAL HIGH (ref 0.44–1.00)
GFR, Estimated: 19 mL/min — ABNORMAL LOW (ref >60)
Glucose, Bld: 204 mg/dL — ABNORMAL HIGH (ref 70–99)
Potassium: 4.2 mmol/L (ref 3.5–5.1)
Sodium: 144 mmol/L (ref 135–145)

## 2022-03-03 LAB — GLUCOSE, CAPILLARY
Glucose-Capillary: 284 mg/dL — ABNORMAL HIGH (ref 70–99)
Glucose-Capillary: 224 mg/dL — ABNORMAL HIGH (ref 70–99)
Glucose-Capillary: 104 mg/dL — ABNORMAL HIGH (ref 70–99)
Glucose-Capillary: 193 mg/dL — ABNORMAL HIGH (ref 70–99)

## 2022-03-03 LAB — CBC
HCT: 30.9 % — ABNORMAL LOW (ref 36.0–46.0)
Hemoglobin: 9.8 g/dL — ABNORMAL LOW (ref 12.0–15.0)
MCH: 29.3 pg (ref 26.0–34.0)
MCHC: 31.7 g/dL (ref 30.0–36.0)
MCV: 92.5 fL (ref 80.0–100.0)
Platelets: 164 10*3/uL (ref 150–400)
RBC: 3.34 MIL/uL — ABNORMAL LOW (ref 3.87–5.11)
RDW: 12.9 % (ref 11.5–15.5)
WBC: 9.7 10*3/uL (ref 4.0–10.5)
nRBC: 0 % (ref 0.0–0.2)

## 2022-03-03 NOTE — Plan of Care (Signed)
  Problem: Education: Goal: Knowledge of General Education information will improve Description: Including pain rating scale, medication(s)/side effects and non-pharmacologic comfort measures Outcome: Progressing   Problem: Health Behavior/Discharge Planning: Goal: Ability to manage health-related needs will improve Outcome: Progressing   Problem: Clinical Measurements: Goal: Ability to maintain clinical measurements within normal limits will improve Outcome: Progressing   Problem: Clinical Measurements: Goal: Respiratory complications will improve Outcome: Progressing   Problem: Clinical Measurements: Goal: Cardiovascular complication will be avoided Outcome: Progressing   Problem: Activity: Goal: Risk for activity intolerance will decrease Outcome: Progressing   Problem: Nutrition: Goal: Adequate nutrition will be maintained Outcome: Progressing   Problem: Coping: Goal: Level of anxiety will decrease Outcome: Progressing   Problem: Pain Managment: Goal: General experience of comfort will improve Outcome: Progressing   Problem: Safety: Goal: Ability to remain free from injury will improve Outcome: Progressing   Problem: Skin Integrity: Goal: Risk for impaired skin integrity will decrease Outcome: Progressing   Problem: Coping: Goal: Ability to adjust to condition or change in health will improve Outcome: Progressing   Problem: Fluid Volume: Goal: Ability to maintain a balanced intake and output will improve Outcome: Progressing   Problem: Health Behavior/Discharge Planning: Goal: Ability to identify and utilize available resources and services will improve Outcome: Progressing   Problem: Health Behavior/Discharge Planning: Goal: Ability to manage health-related needs will improve Outcome: Progressing   Problem: Metabolic: Goal: Ability to maintain appropriate glucose levels will improve Outcome: Progressing   Problem: Nutritional: Goal: Progress  toward achieving an optimal weight will improve Outcome: Progressing   Problem: Skin Integrity: Goal: Risk for impaired skin integrity will decrease Outcome: Progressing   Problem: Tissue Perfusion: Goal: Adequacy of tissue perfusion will improve Outcome: Progressing

## 2022-03-03 NOTE — TOC Progression Note (Signed)
Transition of Care [facility]) - Progression Note    Patient Details  Name: [name] MRN: [id number] Date of Birth: 10-25-1945  Transition of Care [facility]) CM/SW [place], RN Phone Number: 03/03/2022, 2:24 PM  Clinical Narrative:     Spoke with the patient Reviewed that the recommendation is to go to [facility] SNF She is refusing to go to SNF and stated that her daughter will be with her She tells me that she will have a aide during the day for 6 hours a day She is set up with [place] for [facility] services, she stated that she will need a rolling walker, [employer] will deliver to the beside She refused a 3 in 1   Expected Discharge Plan: [place] Barriers to Discharge: Continued Medical Work up  Expected Discharge Plan and Services Expected Discharge Plan: [place]   Discharge Planning Services: CM Consult   Living arrangements for the past 2 months: Single Family Home Expected Discharge Date: 03/02/22               DME Arranged: [place]ford Rile rolling DME Agency: [employer] Date DME Agency Contacted: 03/03/22 Time DME Agency Contacted: [phone] Representative spoke with at DME Agency: [place]: PT [place]: [place] Date [place]: 03/03/22 Time [place]: [place] Representative spoke with at [place]: [place]   Social Determinants of Health ([place]) Interventions    Readmission Risk Interventions     No data to display

## 2022-03-03 NOTE — Evaluation (Signed)
Physical Therapy Evaluation Patient Details Name: [name] MRN: [id number] DOB: [birth year] Today's Date: 03/03/2022  History of Present Illness   [name] is a 11yoF who presents for scheduled Rt reverse TSA with bicep tenodesis on 03/02/22. PMH: crebral aneurysm, CKD, CAD, DM, HOH, HTN, OSA not on CPAP, SLE, chronic low back pain, chronic hip pain. Pt had postoperative wooziness and dyspnea which required overnight admission. PT called to evaluate.  Clinical Impression  Pt admitted with above Dx. Pt has functional limitations due to deficits below (see "PT Problem List"). Pt able to provide some details on baseline functional status. T up in chair, agreeable to session. Transfers with chair arm/SPC LUE. AMB ~73f in session but walks slowly and requires a standing recovery of dyspnea break Q156f Similar situation with stairs performance being winded after 4 steps. Unclear how safely she could manage 16 steps to get into living space. Pt needs extensive cues for safety and to not use her surgical arm for mobility tasks. Pt reports a lot of pain in her chronic hip problem as well after walking and doing stairs. No dizziness or lightheadedness today, no hypoxia on room air. Patient's performance this date reveals decreased ability, independence, and tolerance in performing all basic mobility required for performance of activities of daily living. Pt requires additional DME, close physical assistance, and cues for safe participate in mobility. Pt will benefit from skilled PT intervention to increase independence and safety with basic mobility in preparation for discharge to the venue listed below.       Recommendations for follow up therapy are one component of a multi-disciplinary discharge planning process, led by the attending physician.  Recommendations may be updated based on patient status, additional functional criteria and insurance authorization.  Follow Up Recommendations  Skilled nursing-short term rehab (<3 hours/day) Can patient physically be transported by private vehicle: Yes    Assistance Recommended at Discharge Intermittent Supervision/Assistance  Patient can return home with the following  A little help with walking and/or transfers;A lot of help with bathing/dressing/bathroom;Direct supervision/assist for medications management;Assistance with cooking/housework;Assistance with feeding;Help with stairs or ramp for entrance    Equipment Recommendations None recommended by PT  Recommendations for Other Services       Functional Status Assessment Patient has had a recent decline in their functional status and demonstrates the ability to make significant improvements in function in a reasonable and predictable amount of time.     Precautions / Restrictions Precautions Precautions: Shoulder;Fall Shoulder Interventions: Don joy ultra sling;Shoulder abduction pillow;At all times;Off for dressing/bathing/exercises Restrictions RUE Weight Bearing: Non weight bearing      Mobility  Bed Mobility               General bed mobility comments: NT, in recliner at start and end of session    Transfers Overall transfer level: Needs assistance Equipment used: Straight cane Transfers: Sit to/from Stand Sit to Stand: Supervision                Ambulation/Gait Ambulation/Gait assistance: Min guard Gait Distance (Feet): 90 Feet Assistive device: Straight cane         General Gait Details: very slow, steps frequently due to easily provoked dyspnea on exertion which pt reports is new.  Stairs Stairs: Yes Stairs assistance: Min guard, Supervision Stair Management: One rail Left Number of Stairs: 8 General stair comments: needs repeat safety cues for IV and avoiding RUE use on rail. frequent pauses due to dyspnea.  Wheelchair Mobility  Modified Rankin (Stroke Patients Only)       Balance                                              Pertinent Vitals/Pain Pain Assessment Pain Assessment: Faces Faces Pain Scale: Hurts even more Pain Location: Left hip/low back after moving around Pain Intervention(s): Repositioned, Premedicated before session    [place] expects to be discharged to:: Private residence Living Arrangements: Children Available Help at Discharge: Family;Available PRN/intermittently Type of Home: Apartment Home Access: Stairs to enter Entrance Stairs-Rails: Right;Left Entrance Stairs-Number of Steps: 16 steps to 2nd floor apartment, no elevator access   Home Layout: One level Home Equipment: Cane - single point;Shower seat      Prior Function Prior Level of Function : Independent/Modified Independent             Mobility Comments: ambulates with SPC using R hand ADLs Comments: indep with ADL, drives, 1 fall/"slipped" in past 46mo daughter helps with cooking     Hand Dominance   Dominant Hand: Right    Extremity/Trunk Assessment                Communication   Communication: No difficulties  Cognition                                                General Comments      Exercises     Assessment/Plan    PT Assessment Patient needs continued PT services  PT Problem List Decreased strength;Decreased range of motion;Decreased activity tolerance;Decreased balance;Decreased mobility;Decreased cognition;Decreased safety awareness;Decreased knowledge of precautions;Cardiopulmonary status limiting activity       PT Treatment Interventions DME instruction;Balance training;Gait training;Stair training;Functional mobility training;Therapeutic activities;Therapeutic exercise;Patient/family education    PT Goals (Current goals can be found in the Care Plan section)  Acute Rehab PT Goals Patient Stated Goal: have normal breathing PT Goal Formulation: With patient Time For Goal Achievement: 03/17/22 Potential to Achieve  Goals: Fair    Frequency 7X/week     Co-evaluation               AM-PAC PT "6 Clicks" Mobility  Outcome Measure Help needed turning from your back to your side while in a flat bed without using bedrails?: A Little Help needed moving from lying on your back to sitting on the side of a flat bed without using bedrails?: A Little Help needed moving to and from a bed to a chair (including a wheelchair)?: A Little Help needed standing up from a chair using your arms (e.g., wheelchair or bedside chair)?: A Little Help needed to walk in hospital room?: A Little Help needed climbing 3-5 steps with a railing? : A Little 6 Click Score: 18    End of Session Equipment Utilized During Treatment: Gait belt Activity Tolerance: Patient tolerated treatment well;Patient limited by pain;Treatment limited secondary to medical complications (Comment) Patient left: in chair;with family/visitor present;with call bell/phone within reach;with nursing/sitter in room;with chair alarm set Nurse Communication: Mobility status PT Visit Diagnosis: Difficulty in walking, not elsewhere classified (R26.2);Other abnormalities of gait and mobility (R26.89);Muscle weakness (generalized) (M62.81)    Time: [address]41PT Time Calculation (min) (ACUTE ONLY): 30 min   Charges:   PT  Evaluation $PT Eval Moderate Complexity: 1 Mod PT Treatments $Gait Training: 23-37 mins       11:11 AM, 03/03/22 Etta Grandchild, PT, DPT Physical Therapist - [facility]  [phone] ([place])    Leda [place] C 03/03/2022, 11:07 AM

## 2022-03-03 NOTE — Progress Notes (Signed)
Occupational Therapy Treatment Patient Details Name: [name] MRN: [id number] DOB: 12/12/1945 Today's Date: 03/03/2022   History of present illness  [name] is a 11yoF who presents for scheduled Rt reverse TSA with bicep tenodesis on 03/02/22. PMH: crebral aneurysm, CKD, CAD, DM, HOH, HTN, OSA not on CPAP, SLE, chronic low back pain, chronic hip pain. Pt had postoperative wooziness and dyspnea which required overnight admission.   OT comments  Chart reviewed, pt greeted in bed agreeable to OT tx session. Tx session targeted facilitating carry over and improving indep ADL tasks in setting of surgery. MAX A, step by step vcs required for doffing/donning sling however pt with good understanding of set up and optimal RUE positioning. ADL mobility in room completed with [facility] with supervision with intermittent vcs for safety, no report of dizziness/SOB. Pt reports she plans to hire an aid to assist with ADL following discharge. Pt is left in chair, all needs met. OT will follow acutely.    Recommendations for follow up therapy are one component of a multi-disciplinary discharge planning process, led by the attending physician.  Recommendations may be updated based on patient status, additional functional criteria and insurance authorization.    Follow Up Recommendations  Home health OT    Assistance Recommended at Discharge Frequent or constant Supervision/Assistance  Patient can return home with the following  A little help with walking and/or transfers;A lot of help with bathing/dressing/bathroom;Assistance with cooking/housework;Help with stairs or ramp for entrance;Assist for transportation   Equipment Recommendations  BSC/3in1    Recommendations for Other Services      Precautions / Restrictions Precautions Precautions: Shoulder;Fall Shoulder Interventions: Don joy ultra sling;Shoulder abduction pillow;At all times;Off for dressing/bathing/exercises Restrictions Weight  Bearing Restrictions: Yes RUE Weight Bearing: Non weight bearing       Mobility Bed Mobility Overal bed mobility: Modified Independent                  Transfers Overall transfer level: Needs assistance Equipment used: Straight cane Transfers: Sit to/from Stand Sit to Stand: Supervision                 Balance Overall balance assessment: Needs assistance Sitting-balance support: No upper extremity supported, Feet supported Sitting balance-Leahy Scale: Good     Standing balance support: Single extremity supported Standing balance-Leahy Scale: Fair                             ADL either performed or assessed with clinical judgement   ADL Overall ADL's : Needs assistance/impaired Eating/Feeding: Set up;Sitting Eating/Feeding Details (indicate cue type and reason): cutting food             Upper Body Dressing : Maximal assistance Upper Body Dressing Details (indicate cue type and reason): sling, step by step vcs;     Toilet Transfer: Min guard;Ambulation Toilet Transfer Details (indicate cue type and reason): simulated to bedside chair         Functional mobility during ADLs: Min guard (approx 20' in room, pt with no c/o SOBthroughout, vital signs stable)      Extremity/Trunk Assessment              Vision       Perception     Praxis      Cognition Arousal/Alertness: Awake/alert Behavior During Therapy: WFL for tasks assessed/performed Overall Cognitive Status: Within Functional Limits for tasks assessed  Exercises      Shoulder Instructions       General Comments spo2 >95% on RA throughout    Pertinent Vitals/ Pain       Pain Assessment Pain Assessment: No/denies pain Pain Score: 0-No pain  Home Living Family/patient expects to be discharged to:: Private residence Living Arrangements: Children Available Help at Discharge: Family;Available  PRN/intermittently Type of Home: Apartment Home Access: Stairs to enter Entrance Stairs-Number of Steps: 16 steps to 2nd floor apartment, no elevator access Entrance Stairs-Rails: Right;Left Home Layout: One level     Bathroom Shower/Tub: Teacher, early years/pre: Standard     Home Equipment: Cane - single point;Shower seat          Prior Functioning/Environment              Frequency  Min 2X/week        Progress Toward Goals  OT Goals(current goals can now be found in the care plan section)  Progress towards OT goals: Progressing toward goals     Plan Discharge plan remains appropriate    Co-evaluation                 AM-PAC OT "6 Clicks" Daily Activity     Outcome Measure   Help from another person eating meals?: None Help from another person taking care of personal grooming?: A Little Help from another person toileting, which includes using toliet, bedpan, or urinal?: A Little Help from another person bathing (including washing, rinsing, drying)?: A Lot Help from another person to put on and taking off regular upper body clothing?: A Lot Help from another person to put on and taking off regular lower body clothing?: A Little 6 Click Score: 17    End of Session Equipment Utilized During Treatment: Gait belt (spc)  OT Visit Diagnosis: Other abnormalities of gait and mobility (R26.89);History of falling (Z91.81);Muscle weakness (generalized) (M62.81)   Activity Tolerance Patient tolerated treatment well   Patient Left in chair;with call bell/phone within reach   Nurse Communication Mobility status        Time: 7124-5809 OT Time Calculation (min): 27 min  Charges: OT General Charges $OT Visit: 1 Visit OT Treatments $Self Care/Home Management : 23-37 mins  Shanon Payor, OTD OTR/L  03/03/22, 1:22 PM

## 2022-03-03 NOTE — Care Management Obs Status (Signed)
[place] NOTIFICATION   Patient Details  Name: LEIGHTON LUSTER MRN: [id number] Date of Birth: [birth year]   Medicare Observation Status Notification Given:  Yes    Conception Oms, RN 03/03/2022, 2:23 PM

## 2022-03-03 NOTE — Progress Notes (Signed)
Subjective: 1 Day Post-Op Procedure(s) (LRB): REVERSE SHOULDER ARTHROPLASTY (Right) BICEPS TENODESIS (Right) Patient reports pain as 0 on 0-10 scale in the right shoulder.   Patient is  well but is experiencing shortness of breath this morning. Patient did receive a interscalene block, possible phrenic nerve irritation. Plan is to go Home after hospital stay. Negative for chest pain and shortness of breath Fever: no Gastrointestinal:Negative for nausea and vomiting  Objective: Vital signs in last 24 hours: Temp:  [96.8 F (36 C)-98.3 F (36.8 C)] 98.3 F (36.8 C) (09/13 0603) Pulse Rate:  [64-85] 85 (09/13 0603) Resp:  [12-26] 20 (09/13 0603) BP: (109-176)/(57-93) 146/80 (09/13 0603) SpO2:  [90 %-100 %] 98 % (09/13 0603)  Intake/Output from previous day:  Intake/Output Summary (Last 24 hours) at 03/03/2022 0738 Last data filed at 03/03/2022 0504 Gross per 24 hour  Intake 893.54 ml  Output 75 ml  Net 818.54 ml    Intake/Output this shift: No intake/output data recorded.  Labs: No results for input(s): "HGB" in the last 72 hours. No results for input(s): "WBC", "RBC", "HCT", "PLT" in the last 72 hours. No results for input(s): "NA", "K", "CL", "CO2", "BUN", "CREATININE", "GLUCOSE", "CALCIUM" in the last 72 hours. No results for input(s): "LABPT", "INR" in the last 72 hours.  EXAM General - Patient is Alert, Appropriate, and Oriented Heart: Regular rate and rhythm Lungs: Clear to auscultation. Extremity - ABD soft Incision: dressing C/D/I No cellulitis present Patient with decreased sensation to light touch to the right shoulder from recent nerve block Dressing/Incision - clean, dry, no drainage to the right shoulder Motor Function - intact, moving foot and toes well on exam.  Abdomen soft with intact bowel sounds.  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 b.)07/18/2006 -endovas oblit complex ACOM aneur. c.) Endovas Tx of 6.8x4mm98mCA aneur. d.) 3.5x3mm 34mnant RMCA aneur 2/2 coil compaction. e.) Interval 3.2x3.1mm s50mular outpouching in ACOM c/w mild recannulization in neck. 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 g.) 3.7x2mm re542mnt of prev Tx'd ACOM aneur -failed embol 02/24/2012.   CKD (chronic kidney disease), stage IV (HCC)    a.) solitary functioning kidney on the RIGHT   Complication of anesthesia    a.) delayed emergence   Coronary artery disease 02/23/2007   a.) LHC 02/23/2007 --> EF 50%; 30% pLAD, 70% mRCA --> planned for staged PCI. b.) PCI 02/27/2007: EF 60%; 3.5 x 15 mm Vision BMS to 80% mRCA. b.) LHC 07/24/2007: EF 60%; minor irregs; no occlusive CAD; no intervention. c.) LHC 10/28/2010: EF 60%; 30% mLAD, LCx with minor luminal irregs, 20% ISR mRCA; no intervention. d.) LHC 05/31/2013: EF 60%; 40% mLAD, 30% ISR m-dRCA; no interventions.   DDD (degenerative disc disease), cervical    a.) s/p ACDF C5-C7; hardware in neck; patient appreciates stiffness and issues with mobility   Diastolic dysfunction    a.)  TTE 07/07/2021: EF 87.8%; normal PASP; trace TR/MR; G1DD.   Elevated hemidiaphragm    a.) LEFT   Headache(784.0)    Heart murmur    HLD (hyperlipidemia)    HOH (hard of hearing)    Has hearing aids, doesn't wear   Hyperparathyroidism due to renal insufficiency (HCC)    Hypertension    Incomplete right bundle branch block (RBBB)    Insomnia    a.) takes zolpidem  Long term (current) use of immunomodulator    a.) on DMARD therapy (hydroxychloriquine) for RA/SLE Dx.   Long term current use of antithrombotics/antiplatelets    a.) clopidogrel   Multiple acquired cysts of kidney    OSA (obstructive sleep apnea)    a.) does NOT use nocturnal pap therapy   Pancreatic cyst    Pneumonia    RBBB (right bundle branch block)    Rheumatoid arthritis ([place])    a.) on DMARD; hydroxychloriquine    Systemic lupus erythematosus ([place])    T2DM (type 2 diabetes mellitus) ([place])    Wears dentures    partial lower   Assessment/Plan: 1 Day Post-Op Procedure(s) (LRB): REVERSE SHOULDER ARTHROPLASTY (Right) BICEPS TENODESIS (Right) Principal Problem:   Status post reverse total shoulder replacement, right  Estimated body mass index is 31.44 kg/m as calculated from the following:   Height as of 02/23/22: '5\' 3"'$  (1.6 m).   Weight as of 02/23/22: 80.5 kg. Advance diet Up with therapy D/C IV fluids when tolerating po intake.  Labs reviewed, glucose 321 this morning, on sliding scale insulin. Patient is on 2L of O2 this morning, reports shortness of breath but not chest pain, no back pain.  Wean off of O2 today.  Possible phrenic nerve irritation from recent nerve block, given incentive spirometer. Possible d/c home today pending progress with PT and weaning off O2.  DVT Prophylaxis -  Plavix, TED hose. Non-weightbearing to the right arm.  Raquel Tarvis Blossom, PA-C [facility] Orthopaedic Surgery 03/03/2022, 7:38 AM

## 2022-03-03 NOTE — Anesthesia Postprocedure Evaluation (Signed)
Anesthesia Post Note  Patient: Shervon N Menden  Procedure(s) Performed: REVERSE SHOULDER ARTHROPLASTY (Right: Shoulder) BICEPS TENODESIS (Right)  Patient location during evaluation: PACU Anesthesia Type: General Level of consciousness: awake and alert Pain management: pain level controlled Vital Signs Assessment: post-procedure vital signs reviewed and stable Respiratory status: spontaneous breathing, nonlabored ventilation, respiratory function stable and patient connected to nasal cannula oxygen Cardiovascular status: blood pressure returned to baseline and stable Postop Assessment: no apparent nausea or vomiting Anesthetic complications: no   No notable events documented.   Last Vitals:  Vitals:   03/02/22 2020 03/03/22 0603  BP: (!) 176/93 (!) 146/80  Pulse: 82 85  Resp: 20 20  Temp:  36.8 C  SpO2: 100% 98%    Last Pain:  Vitals:   03/03/22 0646  TempSrc:   PainSc: 0-No pain                 Martha Clan

## 2022-03-03 NOTE — Progress Notes (Signed)
Physical Therapy Treatment Patient Details Name: NARJIS MIRA MRN: [id number] DOB: 24-Jul-1945 Today's Date: 03/03/2022   History of Present Illness  Rosamilia is a 62yoF who presents for scheduled Rt reverse TSA with bicep tenodesis on 03/02/22. PMH: crebral aneurysm, CKD, CAD, DM, HOH, HTN, OSA not on CPAP, SLE, chronic low back pain, chronic hip pain. Pt had postoperative wooziness and dyspnea which required overnight admission.    PT Comments    Pt asleep but easily made awake, agreeable to session. Total volume of AMB increased compared to AM session. Total number of stairs increased since AM session. Mobility tolerance prior to onset arresting dyspnea improved by 50%. Pt's mobility now limited equally by pain in Left hip/low back and dyspnea on exertion. Pt unable to achieve 16 entry steps despite best efforts. Pt still feels her AMB unsteadiness is off from baseline. Will continue to follow.    Recommendations for follow up therapy are one component of a multi-disciplinary discharge planning process, led by the attending physician.  Recommendations may be updated based on patient status, additional functional criteria and insurance authorization.  Follow Up Recommendations  Skilled nursing-short term rehab (<3 hours/day) Can patient physically be transported by private vehicle: Yes   Assistance Recommended at Discharge Intermittent Supervision/Assistance  Patient can return home with the following A little help with walking and/or transfers;A lot of help with bathing/dressing/bathroom;Direct supervision/assist for medications management;Assistance with cooking/housework;Assistance with feeding;Help with stairs or ramp for entrance   Equipment Recommendations  None recommended by PT    Recommendations for Other Services       Precautions / Restrictions Precautions Precautions: Shoulder;Fall Shoulder Interventions: Don joy ultra sling;Shoulder abduction pillow;At all  times;Off for dressing/bathing/exercises Precaution Booklet Issued: Yes (comment) Restrictions Weight Bearing Restrictions: Yes RUE Weight Bearing: Non weight bearing     Mobility  Bed Mobility Overal bed mobility: Needs Assistance Bed Mobility: Sit to Supine       Sit to supine: Supervision, HOB elevated   General bed mobility comments: Left EOB    Transfers Overall transfer level: Needs assistance Equipment used: Straight cane Transfers: Sit to/from Stand Sit to Stand: Supervision           General transfer comment: education on technique that does NOT require a hand change of cane    Ambulation/Gait Ambulation/Gait assistance: Min guard Gait Distance (Feet): 100 Feet Assistive device: Straight cane Gait Pattern/deviations: Step-to pattern Gait velocity: gets a little too fast with quad cane trial, using the cane less for support and is notably more unsteady. Pt reports" I feel like a drunk woman. Why am I walking like this?"     General Gait Details: steady when taking small steps, requires 2 rest breaks, but able to make first 81f without arresting dyspnea (improved over this morning)   Stairs Stairs: Yes Stairs assistance: Min guard, Supervision Stair Management: One rail Left Number of Stairs: 12 General stair comments: needs repeat safety cues RUE disuse on rail/Q!C. frequent pauses due to dyspnea; has to sit after 12. alternate step patterns do not improve progressive hip pain per patient.   Wheelchair Mobility    Modified Rankin (Stroke Patients Only)       Balance                                            Cognition Arousal/Alertness: Awake/alert Behavior During Therapy: WFL for tasks  assessed/performed Overall Cognitive Status: Within Functional Limits for tasks assessed                                 General Comments: still needs cues for NWB RUE particularly on stairs training.        Exercises  Other Exercises Other Exercises: AMB to gym and back c QC Other Exercises: education and adjustment on polar care stap    General Comments General comments (skin integrity, edema, etc.): spo2 >95% on RA throughout      Pertinent Vitals/Pain Pain Assessment Pain Assessment:  (0 ain at entry, worse in left hip with continuos walking and stairs) Faces Pain Scale: Hurts even more Pain Location: Left hip/low back after moving around Pain Intervention(s): Repositioned, Premedicated before session    [place] expects to be discharged to:: Private residence Living Arrangements: Children Available Help at Discharge: Family;Available PRN/intermittently Type of Home: Apartment Home Access: Stairs to enter Entrance Stairs-Rails: Right;Left Entrance Stairs-Number of Steps: 16 steps to 2nd floor apartment, no elevator access   Home Layout: One level Home Equipment: Cane - single point;Shower seat      Prior Function            PT Goals (current goals can now be found in the care plan section) Acute Rehab PT Goals Patient Stated Goal: have normal breathing PT Goal Formulation: With patient Time For Goal Achievement: 03/17/22 Potential to Achieve Goals: Fair Progress towards PT goals: Progressing toward goals    Frequency    7X/week      PT Plan Current plan remains appropriate    Co-evaluation              AM-PAC PT "6 Clicks" Mobility   Outcome Measure  Help needed turning from your back to your side while in a flat bed without using bedrails?: A Little Help needed moving from lying on your back to sitting on the side of a flat bed without using bedrails?: A Little Help needed moving to and from a bed to a chair (including a wheelchair)?: A Little Help needed standing up from a chair using your arms (e.g., wheelchair or bedside chair)?: A Little Help needed to walk in hospital room?: A Little Help needed climbing 3-5 steps with a railing? : A  Little 6 Click Score: 18    End of Session Equipment Utilized During Treatment: Gait belt Activity Tolerance: Patient tolerated treatment well;Patient limited by fatigue;Patient limited by pain Patient left: with call bell/phone within reach;in bed;with bed alarm set Nurse Communication: Mobility status PT Visit Diagnosis: Difficulty in walking, not elsewhere classified (R26.2);Other abnormalities of gait and mobility (R26.89);Muscle weakness (generalized) (M62.81)     Time: 3532-9924 PT Time Calculation (min) (ACUTE ONLY): 40 min  Charges:  $Gait Training: 23-37 mins $Therapeutic Activity: 8-22 mins                    2:16 PM, 03/03/22 Etta Grandchild, PT, DPT Physical Therapist - [facility]  [phone] ([place])    Makeba Delcastillo C 03/03/2022, 2:13 PM

## 2022-03-04 ENCOUNTER — Observation Stay: Payer: Medicare Other

## 2022-03-04 DIAGNOSIS — M75101 Unspecified rotator cuff tear or rupture of right shoulder, not specified as traumatic: Secondary | ICD-10-CM | POA: Diagnosis not present

## 2022-03-04 DIAGNOSIS — Z7902 Long term (current) use of antithrombotics/antiplatelets: Secondary | ICD-10-CM | POA: Diagnosis not present

## 2022-03-04 DIAGNOSIS — E1122 Type 2 diabetes mellitus with diabetic chronic kidney disease: Secondary | ICD-10-CM | POA: Diagnosis not present

## 2022-03-04 DIAGNOSIS — N184 Chronic kidney disease, stage 4 (severe): Secondary | ICD-10-CM | POA: Diagnosis not present

## 2022-03-04 DIAGNOSIS — R0602 Shortness of breath: Secondary | ICD-10-CM | POA: Diagnosis not present

## 2022-03-04 DIAGNOSIS — I2510 Atherosclerotic heart disease of native coronary artery without angina pectoris: Secondary | ICD-10-CM | POA: Diagnosis not present

## 2022-03-04 DIAGNOSIS — I1310 Hypertensive heart and chronic kidney disease without heart failure, with stage 1 through stage 4 chronic kidney disease, or unspecified chronic kidney disease: Secondary | ICD-10-CM | POA: Diagnosis not present

## 2022-03-04 DIAGNOSIS — Z79899 Other long term (current) drug therapy: Secondary | ICD-10-CM | POA: Diagnosis not present

## 2022-03-04 DIAGNOSIS — M7522 Bicipital tendinitis, left shoulder: Secondary | ICD-10-CM | POA: Diagnosis not present

## 2022-03-04 LAB — COMPREHENSIVE METABOLIC PANEL
ALT: <5 U/L (ref 0–44)
AST: 18 U/L (ref 15–41)
Albumin: 3.4 g/dL — ABNORMAL LOW (ref 3.5–5.0)
Alkaline Phosphatase: 50 U/L (ref 38–126)
Anion gap: 7 (ref 5–15)
BUN: 48 mg/dL — ABNORMAL HIGH (ref 8–23)
CO2: 25 mmol/L (ref 22–32)
Calcium: 8.7 mg/dL — ABNORMAL LOW (ref 8.9–10.3)
Chloride: 112 mmol/L — ABNORMAL HIGH (ref 98–111)
Creatinine, Ser: 2.35 mg/dL — ABNORMAL HIGH (ref 0.44–1.00)
GFR, Estimated: 21 mL/min — ABNORMAL LOW (ref >60)
Glucose, Bld: 140 mg/dL — ABNORMAL HIGH (ref 70–99)
Potassium: 4.1 mmol/L (ref 3.5–5.1)
Sodium: 144 mmol/L (ref 135–145)
Total Bilirubin: 0.8 mg/dL (ref 0.3–1.2)
Total Protein: 5.9 g/dL — ABNORMAL LOW (ref 6.5–8.1)

## 2022-03-04 LAB — CBC
HCT: 29.3 % — ABNORMAL LOW (ref 36.0–46.0)
Hemoglobin: 9.3 g/dL — ABNORMAL LOW (ref 12.0–15.0)
MCH: 29.5 pg (ref 26.0–34.0)
MCHC: 31.7 g/dL (ref 30.0–36.0)
MCV: 93 fL (ref 80.0–100.0)
Platelets: 155 10*3/uL (ref 150–400)
RBC: 3.15 MIL/uL — ABNORMAL LOW (ref 3.87–5.11)
RDW: 13.1 % (ref 11.5–15.5)
WBC: 8.7 10*3/uL (ref 4.0–10.5)
nRBC: 0 % (ref 0.0–0.2)

## 2022-03-04 LAB — GLUCOSE, CAPILLARY
Glucose-Capillary: 138 mg/dL — ABNORMAL HIGH (ref 70–99)
Glucose-Capillary: 200 mg/dL — ABNORMAL HIGH (ref 70–99)
Glucose-Capillary: 153 mg/dL — ABNORMAL HIGH (ref 70–99)
Glucose-Capillary: 191 mg/dL — ABNORMAL HIGH (ref 70–99)

## 2022-03-04 LAB — SURGICAL PATHOLOGY

## 2022-03-04 MED ORDER — TECHNETIUM TO 99M ALBUMIN AGGREGATED
3.6500 | Freq: Once | INTRAVENOUS | Status: AC | PRN
  Administered 2022-03-04: 3.65 via INTRAVENOUS

## 2022-03-04 NOTE — Progress Notes (Signed)
Subjective: 2 Days Post-Op Procedure(s) (LRB): REVERSE SHOULDER ARTHROPLASTY (Right) BICEPS TENODESIS (Right) Patient reports pain as mild in the right shoulder.   Patient is  well but is experiencing worsening shortness of breath this morning. Patient did receive a interscalene block, possible phrenic nerve irritation. Was on 2L of O2 this morning when I entered the room. Plan is to go Home after hospital stay. Negative for chest pain but does report shortness of breath. Fever: no Gastrointestinal:Negative for nausea and vomiting Chest x-ray negative for acute process yesterday.  Objective: Vital signs in last 24 hours: Temp:  [97.9 F (36.6 C)-98.6 F (37 C)] 98.6 F (37 C) (09/13 2339) Pulse Rate:  [80-85] 82 (09/13 2339) Resp:  [16-20] 20 (09/13 2339) BP: (149-170)/(67-89) 149/67 (09/13 2339) SpO2:  [95 %-97 %] 97 % (09/13 2339)  Intake/Output from previous day:  Intake/Output Summary (Last 24 hours) at 03/04/2022 0739 Last data filed at 03/03/2022 1800 Gross per 24 hour  Intake 720 ml  Output --  Net 720 ml    Intake/Output this shift: No intake/output data recorded.  Labs: Recent Labs    03/03/22 2250  HGB 9.8*   Recent Labs    03/03/22 2250  WBC 9.7  RBC 3.34*  HCT 30.9*  PLT 164   Recent Labs    03/03/22 2250  NA 144  K 4.2  CL 109  CO2 27  BUN 51*  CREATININE 2.59*  GLUCOSE 204*  CALCIUM 8.7*   No results for input(s): "LABPT", "INR" in the last 72 hours.  EXAM General - Patient is Alert, Appropriate, and Oriented Heart: Regular rate and rhythm Lungs: Clear to auscultation. Extremity - ABD soft Incision: dressing C/D/I No cellulitis present Patient with decreased sensation to light touch to the right shoulder from recent nerve block, however sensation is improving. Dressing/Incision - clean, dry, no drainage to the right shoulder Motor Function - intact, moving foot and toes well on exam.  Abdomen soft with intact bowel sounds.  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 b.)07/18/2006 -endovas oblit complex ACOM aneur. c.) Endovas Tx of 6.8x4mm66mCA aneur. d.) 3.5x3mm 64mnant RMCA aneur 2/2 coil compaction. e.) Interval 3.2x3.1mm s73mular outpouching in ACOM c/w mild recannulization in neck. 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 g.) 3.7x2mm re74mnt of prev Tx'd ACOM aneur -failed embol 02/24/2012.   CKD (chronic kidney disease), stage IV (HCC)    a.) solitary functioning kidney on the RIGHT   Complication of anesthesia    a.) delayed emergence   Coronary artery disease 02/23/2007   a.) LHC 02/23/2007 --> EF 50%; 30% pLAD, 70% mRCA --> planned for staged PCI. b.) PCI 02/27/2007: EF 60%; 3.5 x 15 mm Vision BMS to 80% mRCA. b.) LHC 07/24/2007: EF 60%; minor irregs; no occlusive CAD; no intervention. c.) LHC 10/28/2010: EF 60%; 30% mLAD, LCx with minor luminal irregs, 20% ISR mRCA; no intervention. d.) LHC 05/31/2013: EF 60%; 40% mLAD, 30% ISR m-dRCA; no interventions.   DDD (degenerative disc disease), cervical    a.) s/p ACDF C5-C7; hardware in neck; patient appreciates stiffness and issues with mobility   Diastolic dysfunction    a.)  TTE 07/07/2021: EF 87.8%; normal PASP; trace TR/MR; G1DD.   Elevated hemidiaphragm    a.) LEFT   Headache(784.0)    Heart murmur    HLD (  hyperlipidemia)    HOH (hard of hearing)    Has hearing aids, doesn't wear   Hyperparathyroidism due to renal insufficiency (HCC)    Hypertension    Incomplete right bundle branch block (RBBB)    Insomnia    a.) takes zolpidem   Long term (current) use of immunomodulator    a.) on DMARD therapy (hydroxychloriquine) for RA/SLE Dx.   Long term current use of antithrombotics/antiplatelets    a.) clopidogrel   Multiple acquired cysts of kidney    OSA (obstructive sleep apnea)    a.) does NOT use nocturnal pap  therapy   Pancreatic cyst    Pneumonia    RBBB (right bundle branch block)    Rheumatoid arthritis ([place])    a.) on DMARD; hydroxychloriquine   Systemic lupus erythematosus ([place])    T2DM (type 2 diabetes mellitus) ([place])    Wears dentures    partial lower   Assessment/Plan: 2 Days Post-Op Procedure(s) (LRB): REVERSE SHOULDER ARTHROPLASTY (Right) BICEPS TENODESIS (Right) Principal Problem:   Status post reverse total shoulder replacement, right  Estimated body mass index is 31.44 kg/m as calculated from the following:   Height as of 02/23/22: '5\' 3"'$  (1.6 m).   Weight as of 02/23/22: 80.5 kg. Advance diet Up with therapy D/C IV fluids when tolerating po intake.  Labs reviewed, Hg 9.3 this morning, BUN and Creatinine improved compared to 9/13. Patient is on 2L of O2 this morning, reports shortness of breath but not chest pain, no back pain.  Unable to have a CT with contrast, will proceed with a V/Q scan to r/o PE. Up with therapy as tolerated today.   Plan for possible d/c home pending V/Q scan results.  DVT Prophylaxis -  Plavix, TED hose. Non-weightbearing to the right arm.  Raquel Amare Bail, PA-C [facility] Orthopaedic Surgery 03/04/2022, 7:39 AM

## 2022-03-04 NOTE — Progress Notes (Signed)
Physical Therapy Treatment Patient Details Name: [name] MRN: [id number] DOB: 02/07/1946 Today's Date: 03/04/2022   History of Present Illness  Digangi is a 53yoF who presents for scheduled Rt reverse TSA with bicep tenodesis on 03/02/22. PMH: crebral aneurysm, CKD, CAD, DM, HOH, HTN, OSA not on CPAP, SLE, chronic low back pain, chronic hip pain. Pt had postoperative wooziness and dyspnea which required overnight admission.    PT Comments    Pt was cleared to participate with PT. She was supine in bed with supportive daughter at bedside. RN in room checking vitals. O2 stable on room air with all vitals readings stable for OOB activity. Ambulated ~ 50 ft with SPC prior to rechecked vitals. They continued to be stable but pt endorses slight SOB. Proceeded to ascending/descending stairs with LUE support on rail only. Overall tolerated stair training but does have SOB noted with prolonged standing rest breaks between trials. Returned to room and repositioned in recliner. MD/PA made aware. Planning to keep pt one more night and Dcing after AM session tomorrow. Pt is requested EMS home with HHPT + as much [place] aide services as able. Will discuss with CM in morning. Pt will benefit from continued skilled PT at DC to address deficits while maximizing independence with ADLs.    Recommendations for follow up therapy are one component of a multi-disciplinary discharge planning process, led by the attending physician.  Recommendations may be updated based on patient status, additional functional criteria and insurance authorization.  Follow Up Recommendations  Skilled nursing-short term rehab (<3 hours/day)     Assistance Recommended at Discharge Intermittent Supervision/Assistance  Patient can return home with the following A little help with walking and/or transfers;A lot of help with bathing/dressing/bathroom;Direct supervision/assist for medications management;Assistance with  cooking/housework;Assistance with feeding;Help with stairs or ramp for entrance   Equipment Recommendations  None recommended by PT       Precautions / Restrictions Precautions Precautions: Shoulder;Fall Shoulder Interventions: Don joy ultra sling;Shoulder abduction pillow;At all times;Off for dressing/bathing/exercises Precaution Booklet Issued: Yes (comment) Restrictions Weight Bearing Restrictions: Yes RUE Weight Bearing: Non weight bearing     Mobility  Bed Mobility Overal bed mobility: Needs Assistance Bed Mobility: Supine to Sit, Sit to Supine     Supine to sit: Supervision Sit to supine: Supervision     Transfers Overall transfer level: Needs assistance Equipment used: Straight cane Transfers: Sit to/from Stand Sit to Stand: Supervision       Ambulation/Gait Ambulation/Gait assistance: Min guard Gait Distance (Feet): 100 Feet Assistive device: Straight cane Gait Pattern/deviations: Step-through pattern Gait velocity: decreased     General Gait Details: pt was able to ambulate with SPC w~ 100 ft total. also performed ascending/descending stairs without seated rest between.   Stairs Stairs: Yes Stairs assistance: Min guard, Supervision Stair Management: One rail Left, Step to pattern, Forwards Number of Stairs: 4 General stair comments: pt perform 4 x stair with LUE support only. needs standing rest between going up/down due to slight SOB. Overall tolerated well.    Balance Overall balance assessment: Needs assistance Sitting-balance support: No upper extremity supported, Feet supported Sitting balance-Leahy Scale: Good     Standing balance support: Single extremity supported Standing balance-Leahy Scale: Fair       Cognition Arousal/Alertness: Awake/alert Behavior During Therapy: WFL for tasks assessed/performed Overall Cognitive Status: Within Functional Limits for tasks assessed        General Comments: Pt was A and O and agreeable to  session with encouragement. Supportive daughter/roomate  present throughout session               Pertinent Vitals/Pain Pain Assessment Pain Assessment: 0-10 Pain Score: 4  Faces Pain Scale: Hurts a little bit Pain Location: L hip and R shoulder Pain Descriptors / Indicators: Discomfort Pain Intervention(s): Limited activity within patient's tolerance, Monitored during session, Premedicated before session, Repositioned, Ice applied     PT Goals (current goals can now be found in the care plan section) Acute Rehab PT Goals Patient Stated Goal: less SOB Progress towards PT goals: Progressing toward goals    Frequency    7X/week      PT Plan Current plan remains appropriate       AM-PAC PT "6 Clicks" Mobility   Outcome Measure  Help needed turning from your back to your side while in a flat bed without using bedrails?: A Little Help needed moving from lying on your back to sitting on the side of a flat bed without using bedrails?: A Little Help needed moving to and from a bed to a chair (including a wheelchair)?: A Little Help needed standing up from a chair using your arms (e.g., wheelchair or bedside chair)?: A Little Help needed to walk in hospital room?: A Little Help needed climbing 3-5 steps with a railing? : A Little 6 Click Score: 18    End of Session Equipment Utilized During Treatment: Gait belt Activity Tolerance: Patient tolerated treatment well;Patient limited by fatigue Patient left: in bed;with call bell/phone within reach;with bed alarm set;with family/visitor present (polar care re-applied) Nurse Communication: Mobility status PT Visit Diagnosis: Difficulty in walking, not elsewhere classified (R26.2);Other abnormalities of gait and mobility (R26.89);Muscle weakness (generalized) (M62.81)     Time: 2440-1027 PT Time Calculation (min) (ACUTE ONLY): 20 min  Charges:  $Gait Training: 8-22 mins                     Julaine Fusi PTA 03/04/22,  5:05 PM

## 2022-03-04 NOTE — Progress Notes (Signed)
OT Cancellation Note  Patient Details Name: [name] MRN: [id number] DOB: 1946/05/13   Cancelled Treatment:    Reason Eval/Treat Not Completed: Other (comment) (pt still undergoing testing to rule out PE, OT will hold at this time. Will reattempt as able.Shanon Payor, OTD OTR/L  03/04/22, 3:33 PM

## 2022-03-04 NOTE — Progress Notes (Signed)
PT Cancellation Note  Patient Details Name: [name] MRN: [id number] DOB: 09-09-1945   Cancelled Treatment:     Pt remains lethargic throughout the day. Still ruling out PE. Will hold PT until results are released. Pt has a lot of stairs to enter home. May need EMS home if pt DCs home against recommendations.    Willette Pa 03/04/2022, 2:49 PM

## 2022-03-04 NOTE — Plan of Care (Signed)
  Problem: Education: Goal: Knowledge of General Education information will improve Description: Including pain rating scale, medication(s)/side effects and non-pharmacologic comfort measures Outcome: Progressing   Problem: Health Behavior/Discharge Planning: Goal: Ability to manage health-related needs will improve Outcome: Progressing   Problem: Clinical Measurements: Goal: Ability to maintain clinical measurements within normal limits will improve Outcome: Progressing   Problem: Clinical Measurements: Goal: Cardiovascular complication will be avoided Outcome: Progressing   Problem: Activity: Goal: Risk for activity intolerance will decrease Outcome: Progressing   Problem: Nutrition: Goal: Adequate nutrition will be maintained Outcome: Progressing   Problem: Coping: Goal: Level of anxiety will decrease Outcome: Progressing   Problem: Pain Managment: Goal: General experience of comfort will improve Outcome: Progressing   Problem: Safety: Goal: Ability to remain free from injury will improve Outcome: Progressing   Problem: Skin Integrity: Goal: Risk for impaired skin integrity will decrease Outcome: Progressing   Problem: Coping: Goal: Ability to adjust to condition or change in health will improve Outcome: Progressing   Problem: Health Behavior/Discharge Planning: Goal: Ability to identify and utilize available resources and services will improve Outcome: Progressing   Problem: Metabolic: Goal: Ability to maintain appropriate glucose levels will improve Outcome: Progressing   Problem: Nutritional: Goal: Maintenance of adequate nutrition will improve Outcome: Progressing   Problem: Skin Integrity: Goal: Risk for impaired skin integrity will decrease Outcome: Progressing   Problem: Tissue Perfusion: Goal: Adequacy of tissue perfusion will improve Outcome: Progressing

## 2022-03-05 DIAGNOSIS — I2510 Atherosclerotic heart disease of native coronary artery without angina pectoris: Secondary | ICD-10-CM | POA: Diagnosis not present

## 2022-03-05 DIAGNOSIS — N184 Chronic kidney disease, stage 4 (severe): Secondary | ICD-10-CM | POA: Diagnosis not present

## 2022-03-05 DIAGNOSIS — M7522 Bicipital tendinitis, left shoulder: Secondary | ICD-10-CM | POA: Diagnosis not present

## 2022-03-05 DIAGNOSIS — Z79899 Other long term (current) drug therapy: Secondary | ICD-10-CM | POA: Diagnosis not present

## 2022-03-05 DIAGNOSIS — E1122 Type 2 diabetes mellitus with diabetic chronic kidney disease: Secondary | ICD-10-CM | POA: Diagnosis not present

## 2022-03-05 DIAGNOSIS — M75101 Unspecified rotator cuff tear or rupture of right shoulder, not specified as traumatic: Secondary | ICD-10-CM | POA: Diagnosis not present

## 2022-03-05 DIAGNOSIS — Z7902 Long term (current) use of antithrombotics/antiplatelets: Secondary | ICD-10-CM | POA: Diagnosis not present

## 2022-03-05 DIAGNOSIS — I1310 Hypertensive heart and chronic kidney disease without heart failure, with stage 1 through stage 4 chronic kidney disease, or unspecified chronic kidney disease: Secondary | ICD-10-CM | POA: Diagnosis not present

## 2022-03-05 LAB — GLUCOSE, CAPILLARY: Glucose-Capillary: 122 mg/dL — ABNORMAL HIGH (ref 70–99)

## 2022-03-05 MED ORDER — DEXTROMETHORPHAN POLISTIREX ER 30 MG/5ML PO SUER
30.0000 mg | Freq: Two times a day (BID) | ORAL | Status: DC
  Administered 2022-03-05: 30 mg via ORAL
  Filled 2022-03-05 (×2): qty 5

## 2022-03-05 MED ORDER — TRAMADOL HCL 50 MG PO TABS: 50.0000 mg | ORAL_TABLET | Freq: Four times a day (QID) | ORAL | 0 refills | Status: DC | PRN

## 2022-03-05 MED ORDER — ONDANSETRON HCL 4 MG PO TABS: 4.0000 mg | ORAL_TABLET | Freq: Four times a day (QID) | ORAL | 0 refills | Status: DC | PRN

## 2022-03-05 NOTE — Progress Notes (Signed)
Physical Therapy Treatment Patient Details Name: [name] MRN: [id number] DOB: [birth year] Today's Date: 03/05/2022   History of Present Illness  [name] is a 28yoF who presents for scheduled Rt reverse TSA with bicep tenodesis on 03/02/22. PMH: crebral aneurysm, CKD, CAD, DM, HOH, HTN, OSA not on CPAP, SLE, chronic low back pain, chronic hip pain. Pt had postoperative wooziness and dyspnea which required overnight admission.    PT Comments    Pt was long sitting in bed upon arriving. She is AS and O x 4. Agrees to session but refused stair training. She was easily and safely able to exit L side of bed, stand to RW, and tolerate ambulation with SPC. Overall less SOB noted but did still require 1 standing rest. Pt is planning to DC home with [facility] services later this date. She is requesting EMS transport home. CM made aware of pt's request. Pt will benefit form continued skilled PT at DC to address deficits while maximizing independence with ADLs. Pt lives with daughter and is planning to have aides assisting at [place].     Recommendations for follow up therapy are one component of a multi-disciplinary discharge planning process, led by the attending physician.  Recommendations may be updated based on patient status, additional functional criteria and insurance authorization.  Follow Up Recommendations  Other (comment) (Pt is planning to DC home with HHPT to follow.)     Assistance Recommended at Discharge Intermittent Supervision/Assistance  Patient can return home with the following A little help with walking and/or transfers;A lot of help with bathing/dressing/bathroom;Direct supervision/assist for medications management;Assistance with cooking/housework;Assistance with feeding;Help with stairs or ramp for entrance   Equipment Recommendations  None recommended by PT       Precautions / Restrictions Precautions Precautions: Shoulder;Fall Shoulder Interventions: Don joy ultra  sling;Shoulder abduction pillow;At all times;Off for dressing/bathing/exercises Precaution Booklet Issued: Yes (comment) Restrictions Weight Bearing Restrictions: Yes RUE Weight Bearing: Non weight bearing     Mobility  Bed Mobility Overal bed mobility: Needs Assistance Bed Mobility: Supine to Sit, Sit to Supine     Supine to sit: Supervision Sit to supine: Supervision        Transfers Overall transfer level: Needs assistance Equipment used: Straight cane Transfers: Sit to/from Stand Sit to Stand: Supervision     Ambulation/Gait Ambulation/Gait assistance: Supervision Gait Distance (Feet): 120 Feet Assistive device: Straight cane Gait Pattern/deviations: Step-through pattern Gait velocity: decreased     General Gait Details: slow cadence but safe. Less Overall SOB today but did need 1 standing rest. Pt states confidence in [place] home later this date.   Stairs    General stair comments: pt refused stair training this date.    Balance Overall balance assessment: Needs assistance Sitting-balance support: No upper extremity supported, Feet supported Sitting balance-Leahy Scale: Good     Standing balance support: Single extremity supported Standing balance-Leahy Scale: Fair       Cognition Arousal/Alertness: Awake/alert Behavior During Therapy: WFL for tasks assessed/performed Overall Cognitive Status: Within Functional Limits for tasks assessed      General Comments: Pt was A and O and agreeable to session with encouragement. Supportive daughter/roomate present throughout session               Pertinent Vitals/Pain Pain Assessment Pain Assessment: 0-10 Pain Score: 3  Faces Pain Scale: Hurts a little bit Pain Location: L hip and R shoulder Pain Descriptors / Indicators: Discomfort Pain Intervention(s): Limited activity within patient's tolerance, Monitored during session, Premedicated before session,  Repositioned, Ice applied     PT Goals (current  goals can now be found in the care plan section) Acute Rehab PT Goals Patient Stated Goal: none stated Progress towards PT goals: Progressing toward goals    Frequency    7X/week      PT Plan Current plan remains appropriate       AM-PAC PT "6 Clicks" Mobility   Outcome Measure  Help needed turning from your back to your side while in a flat bed without using bedrails?: A Little Help needed moving from lying on your back to sitting on the side of a flat bed without using bedrails?: A Little Help needed moving to and from a bed to a chair (including a wheelchair)?: A Little Help needed standing up from a chair using your arms (e.g., wheelchair or bedside chair)?: A Little Help needed to walk in hospital room?: A Little Help needed climbing 3-5 steps with a railing? : A Little 6 Click Score: 18    End of Session Equipment Utilized During Treatment: Gait belt Activity Tolerance: Patient tolerated treatment well;Patient limited by fatigue Patient left: in chair;Other (comment) (OT/RN in room) Nurse Communication: Mobility status PT Visit Diagnosis: Difficulty in walking, not elsewhere classified (R26.2);Other abnormalities of gait and mobility (R26.89);Muscle weakness (generalized) (M62.81)     Time: 0315-9458 PT Time Calculation (min) (ACUTE ONLY): 17 min  Charges:  $Gait Training: 8-22 mins                    Julaine Fusi PTA 03/05/22, 9:31 AM

## 2022-03-05 NOTE — Progress Notes (Signed)
Patient and daughter was given discharge instructions, acknowledge understanding and states she will keep all appointments, taken to car by wheelchair

## 2022-03-05 NOTE — TOC Progression Note (Signed)
Transition of Care [facility]) - Progression Note    Patient Details  Name: [name] MRN: [id number] Date of Birth: 18-Sep-1945  Transition of Care [facility]) CM/SW [place], RN Phone Number: 03/05/2022, 10:06 AM  Clinical Narrative:     Met with the patient in the room, She was thinking she could get EMS to go home, I explained to her it is not likely to get Insurance approval for EMS after shoulder surgery, She stated that she will have her daughter pick her up, She has a rollign walker in the room supplied by [employer], she stated that she does not want it, [employer] notified to pick up the rolling walker  Expected Discharge Plan: [place] Barriers to Discharge: Continued Medical Work up  Expected Discharge Plan and Services Expected Discharge Plan: [place]   Discharge Planning Services: CM Consult   Living arrangements for the past 2 months: Single Family Home Expected Discharge Date: 03/02/22               DME Arranged: Gilford Rile rolling DME Agency: [employer] Date DME Agency Contacted: 03/03/22 Time DME Agency Contacted: [phone] Representative spoke with at DME Agency: Edwyna Ready [place]: PT [place]: [place] Date [place]: 03/03/22 Time [place]: [place] Representative spoke with at [place]: [place]   Social Determinants of Health ([place]) Interventions    Readmission Risk Interventions     No data to display

## 2022-03-05 NOTE — Progress Notes (Addendum)
Occupational Therapy Treatment Patient Details Name: [name] MRN: [id number] DOB: June 03, 1946 Today's Date: 03/05/2022   History of present illness  [name] is a 34yoF who presents for scheduled Rt reverse TSA with bicep tenodesis on 03/02/22. PMH: crebral aneurysm, CKD, CAD, DM, HOH, HTN, OSA not on CPAP, SLE, chronic low back pain, chronic hip pain. Pt had postoperative wooziness and dyspnea which required overnight admission.   OT comments  Chart reviewed, pt greeted in chair following PT session. Tx session targeted improving indep ADL completion with shoulder precautions. Improvements noted in knowledge/self direction of donning sling however continues to require MAX A to doff/donn. Education provided re: HEP, RUE precautions, Adl completion with fair carry over noted. Pt will continue to benefit from skilled OT to address functional deficits and to facilitate safe and optimal ADL completion.Pt reports she will be discharging home, will have aids to assist as needed, recommend max HHOT following discharge to facilitate improved and safe ADL completion. Vital signs stable throughout with no SOB noted or reported. OT will continue to follow acutely.    Recommendations for follow up therapy are one component of a multi-disciplinary discharge planning process, led by the attending physician.  Recommendations may be updated based on patient status, additional functional criteria and insurance authorization.    Follow Up Recommendations  Follow physician's recommendations for discharge plan and follow up therapies    Assistance Recommended at Discharge Frequent or constant Supervision/Assistance  Patient can return home with the following  A little help with walking and/or transfers;A lot of help with bathing/dressing/bathroom;Assistance with cooking/housework;Help with stairs or ramp for entrance;Assist for transportation   Equipment Recommendations  BSC/3in1     Recommendations for Other Services      Precautions / Restrictions Precautions Precautions: Shoulder;Fall Shoulder Interventions: Don joy ultra sling;Shoulder abduction pillow;At all times;Off for dressing/bathing/exercises Precaution Booklet Issued: Yes (comment) Restrictions Weight Bearing Restrictions: Yes RUE Weight Bearing: Non weight bearing       Mobility Bed Mobility               General bed mobility comments: NT pt in chair pre/post session    Transfers Overall transfer level: Needs assistance Equipment used: Straight cane Transfers: Sit to/from Stand Sit to Stand: Supervision                 Balance Overall balance assessment: Needs assistance Sitting-balance support: No upper extremity supported, Feet supported Sitting balance-[name] Scale: Good     Standing balance support: Single extremity supported Standing balance-[name] Scale: Fair                             ADL either performed or assessed with clinical judgement   ADL Overall ADL's : Needs assistance/impaired Eating/Feeding: Set up;Sitting               Upper Body Dressing : Minimal assistance;Maximal assistance Upper Body Dressing Details (indicate cue type and reason): MIN A for open front shirt with step by step vcs, MAX A for sling Lower Body Dressing: Sit to/from stand;Minimal assistance Lower Body Dressing Details (indicate cue type and reason): pants Toilet Transfer: Min guard;Ambulation Toilet Transfer Details (indicate cue type and reason): simulated                Extremity/Trunk Assessment              Vision       Perception     Praxis  Cognition Arousal/Alertness: Awake/alert Behavior During Therapy: WFL for tasks assessed/performed Overall Cognitive Status: Within Functional Limits for tasks assessed                                          Exercises      Shoulder Instructions       General Comments       Pertinent Vitals/ Pain       Pain Assessment Pain Assessment: 0-10 Pain Score: 7  Pain Location: R shoulder Pain Descriptors / Indicators: Discomfort Pain Intervention(s): Limited activity within patient's tolerance, Ice applied, Monitored during session, Repositioned  Home Living                                          Prior Functioning/Environment              Frequency  Min 2X/week        Progress Toward Goals  OT Goals(current goals can now be found in the care plan section)  Progress towards OT goals: Progressing toward goals     Plan Discharge plan remains appropriate    Co-evaluation                 AM-PAC OT "6 Clicks" Daily Activity     Outcome Measure   Help from another person eating meals?: None Help from another person taking care of personal grooming?: A Little Help from another person toileting, which includes using toliet, bedpan, or urinal?: A Little Help from another person bathing (including washing, rinsing, drying)?: A Lot Help from another person to put on and taking off regular upper body clothing?: A Lot Help from another person to put on and taking off regular lower body clothing?: A Little 6 Click Score: 17    End of Session Equipment Utilized During Treatment: Gait belt  OT Visit Diagnosis: Other abnormalities of gait and mobility (R26.89);History of falling (Z91.81);Muscle weakness (generalized) (M62.81)   Activity Tolerance Patient tolerated treatment well   Patient Left in chair;with call bell/phone within reach   Nurse Communication Mobility status        Time: 4103-0131 OT Time Calculation (min): 18 min  Charges: OT General Charges $OT Visit: 1 Visit OT Treatments $Self Care/Home Management : 8-22 mins  Shanon Payor, OTD OTR/L  03/05/22, 10:15 AM

## 2022-03-05 NOTE — Progress Notes (Signed)
Subjective: 3 Days Post-Op Procedure(s) (LRB): REVERSE SHOULDER ARTHROPLASTY (Right) BICEPS TENODESIS (Right) Patient reports pain as mild in the right shoulder.   Patient is well, reports that her SOB is improving. Underwent a V/Q scan yesterday, negative for PE. On room air this morning. Plan is to go Home after hospital stay. Negative for chest pain, SOB is improving. Fever: no Gastrointestinal:Negative for nausea and vomiting Chest x-ray negative for acute process in addition to V/Q scan.  Objective: Vital signs in last 24 hours: Temp:  [98.2 F (36.8 C)-98.9 F (37.2 C)] 98.9 F (37.2 C) (09/14 2031) Pulse Rate:  [84-95] 95 (09/14 2031) Resp:  [8-20] 20 (09/14 2031) BP: (153-168)/(76-83) 168/76 (09/14 2031) SpO2:  [95 %-100 %] 100 % (09/14 2031)  Intake/Output from previous day: No intake or output data in the 24 hours ending 03/05/22 0754   Intake/Output this shift: No intake/output data recorded.  Labs: Recent Labs    03/03/22 2250 03/04/22 0822  HGB 9.8* 9.3*   Recent Labs    03/03/22 2250 03/04/22 0822  WBC 9.7 8.7  RBC 3.34* 3.15*  HCT 30.9* 29.3*  PLT 164 155   Recent Labs    03/03/22 2250 03/04/22 0822  NA 144 144  K 4.2 4.1  CL 109 112*  CO2 27 25  BUN 51* 48*  CREATININE 2.59* 2.35*  GLUCOSE 204* 140*  CALCIUM 8.7* 8.7*   No results for input(s): "LABPT", "INR" in the last 72 hours.  EXAM General - Patient is Alert, Appropriate, and Oriented Heart: Regular rate and rhythm Lungs: Clear to auscultation. Extremity - ABD soft Neurovascular intact Incision: dressing C/D/I No cellulitis present Patient is intact to light touch to the right arm this morning. Dressing/Incision - clean, dry, no drainage to the right shoulder Motor Function - intact, moving foot and toes well on exam.  Abdomen soft with intact bowel sounds.  Past Medical History:  Diagnosis Date   Anemia of chronic renal disease    Arthritis    Cerebral aneurysm  06/07/2006   a.) 6.2x4.63m and 4.9x4.23mACOM & 7x4.77m38mMCA aneur. b.)07/18/2006 -endovas oblit complex ACOM aneur. c.) Endovas Tx of 6.8x4mm54mCA aneur. d.) 3.5x3mm 577mnant RMCA aneur 2/2 coil compaction. e.) Interval 3.2x3.1mm s40mular outpouching in ACOM c/w mild recannulization in neck. 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 g.) 3.7x2mm re24mnt of prev Tx'd ACOM aneur -failed embol 02/24/2012.   CKD (chronic kidney disease), stage IV (HCC)    a.) solitary functioning kidney on the RIGHT   Complication of anesthesia    a.) delayed emergence   Coronary artery disease 02/23/2007   a.) LHC 02/23/2007 --> EF 50%; 30% pLAD, 70% mRCA --> planned for staged PCI. b.) PCI 02/27/2007: EF 60%; 3.5 x 15 mm Vision BMS to 80% mRCA. b.) LHC 07/24/2007: EF 60%; minor irregs; no occlusive CAD; no intervention. c.) LHC 10/28/2010: EF 60%; 30% mLAD, LCx with minor luminal irregs, 20% ISR mRCA; no intervention. d.) LHC 05/31/2013: EF 60%; 40% mLAD, 30% ISR m-dRCA; no interventions.   DDD (degenerative disc disease), cervical    a.) s/p ACDF C5-C7; hardware in neck; patient appreciates stiffness and issues with mobility   Diastolic dysfunction    a.)  TTE 07/07/2021: EF 87.8%; normal PASP; trace TR/MR; G1DD.   Elevated hemidiaphragm    a.) LEFT   Headache(784.0)    Heart murmur    HLD (hyperlipidemia)    HOH (hard of hearing)    Has hearing aids, doesn't wear  Hyperparathyroidism due to renal insufficiency (HCC)    Hypertension    Incomplete right bundle branch block (RBBB)    Insomnia    a.) takes zolpidem   Long term (current) use of immunomodulator    a.) on DMARD therapy (hydroxychloriquine) for RA/SLE Dx.   Long term current use of antithrombotics/antiplatelets    a.) clopidogrel   Multiple acquired cysts of kidney    OSA (obstructive sleep apnea)    a.) does NOT use nocturnal pap therapy   Pancreatic cyst    Pneumonia    RBBB (right bundle branch block)    Rheumatoid arthritis  ([place])    a.) on DMARD; hydroxychloriquine   Systemic lupus erythematosus ([place])    T2DM (type 2 diabetes mellitus) ([place])    Wears dentures    partial lower   Assessment/Plan: 3 Days Post-Op Procedure(s) (LRB): REVERSE SHOULDER ARTHROPLASTY (Right) BICEPS TENODESIS (Right) Principal Problem:   Status post reverse total shoulder replacement, right  Estimated body mass index is 31.44 kg/m as calculated from the following:   Height as of 02/23/22: '5\' 3"'$  (1.6 m).   Weight as of 02/23/22: 80.5 kg. Advance diet Up with therapy D/C IV fluids when tolerating po intake.  Vitals reviewed this AM, O2 Sat 95% on room air this morning.  States that the SOB is improving.  Likely phrenic nerve irritation.  V/Q scan negative for PE. Patient is passing gas without pain this morning, urinating without issue. Up with therapy today.   Plan for discharge home today after morning session of PT with HHPT.  DVT Prophylaxis -  Plavix, TED hose. Non-weightbearing to the right arm.  Raquel Michala Deblanc, PA-C [facility] 03/05/2022, 7:54 AM

## 2022-03-05 NOTE — Plan of Care (Signed)
  Problem: Health Behavior/Discharge Planning: Goal: Ability to manage health-related needs will improve Outcome: Progressing   Problem: Clinical Measurements: Goal: Will remain free from infection Outcome: Progressing Goal: Cardiovascular complication will be avoided Outcome: Progressing   Problem: Activity: Goal: Risk for activity intolerance will decrease Outcome: Progressing   Problem: Nutrition: Goal: Adequate nutrition will be maintained Outcome: Progressing   Problem: Elimination: Goal: Will not experience complications related to urinary retention Outcome: Progressing

## 2022-03-05 NOTE — Discharge Summary (Signed)
Physician Discharge Summary  Patient ID: [name] MRN: [id number] DOB/AGE: May 23, 1946 76 y.o.  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  Discharge Diagnoses: Patient Active Problem List   Diagnosis Date Noted   Status post reverse total shoulder replacement, 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   Thickened endometrium    Rectal bleeding    Systemic lupus erythematosus ([place]) 09/19/2019   Coronary disease 11/03/2017   Diabetes ([place]) 11/03/2017   Gout 11/03/2017   Osteoarthritis 11/03/2017    Past Medical History:  Diagnosis Date   Anemia of chronic renal disease    Arthritis    Cerebral aneurysm 06/07/2006   a.) 6.2x4.67m and 4.9x4.296mACOM & 7x4.74m58mMCA aneur. b.)07/18/2006 -endovas oblit complex ACOM aneur. c.) Endovas Tx of 6.8x4mm32mCA aneur. d.) 3.5x3mm 64mnant RMCA aneur 2/2 coil compaction. e.) Interval 3.2x3.1mm s84mular outpouching in ACOM c/w mild recannulization in neck. 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 g.) 3.7x2mm re34mnt of prev Tx'd ACOM aneur -failed embol 02/24/2012.   CKD (chronic kidney disease), stage IV (HCC)    a.) solitary functioning kidney on the RIGHT   Complication of anesthesia    a.) delayed emergence   Coronary artery disease 02/23/2007   a.) LHC 02/23/2007 --> EF 50%; 30% pLAD, 70% mRCA --> planned for staged PCI. b.) PCI 02/27/2007: EF 60%; 3.5 x 15 mm Vision BMS to 80% mRCA. b.) LHC 07/24/2007: EF 60%; minor irregs; no occlusive CAD; no intervention. c.) LHC 10/28/2010: EF 60%; 30% mLAD, LCx with minor luminal irregs, 20% ISR mRCA; no intervention. d.) LHC 05/31/2013: EF 60%; 40% mLAD, 30% ISR m-dRCA; no interventions.   DDD (degenerative disc disease), cervical    a.) s/p ACDF C5-C7; hardware in neck; patient appreciates stiffness and  issues with mobility   Diastolic dysfunction    a.)  TTE 07/07/2021: EF 87.8%; normal PASP; trace TR/MR; G1DD.   Elevated hemidiaphragm    a.) LEFT   Headache(784.0)    Heart murmur    HLD (hyperlipidemia)    HOH (hard of hearing)    Has hearing aids, doesn't wear   Hyperparathyroidism due to renal insufficiency (HCC)    Hypertension    Incomplete right bundle branch block (RBBB)    Insomnia    a.) takes zolpidem   Long term (current) use of immunomodulator    a.) on DMARD therapy (hydroxychloriquine) for RA/SLE Dx.   Long term current use of antithrombotics/antiplatelets    a.) clopidogrel   Multiple acquired cysts of kidney    OSA (obstructive sleep apnea)    a.) does NOT use nocturnal pap therapy   Pancreatic cyst    Pneumonia    RBBB (right bundle branch block)    Rheumatoid arthritis (HCC)   [place]) on DMARD; hydroxychloriquine   Systemic lupus erythematosus (HCC)   [place] (type 2 diabetes mellitus) (HCC)   [place] dentures    partial lower     Transfusion: None.   Consultants (if any):   Discharged Condition: Improved  Hospital Course: Cathy BABULA76 y.o.58emale who was admitted 03/02/2022 with a diagnosis of severe degenerative joint disease with biceps tendinopathy and rotator cuff tendinopathy of the right shoulder and went to the operating room on 03/02/2022 and underwent the above named procedures.    Surgeries: Procedure(s): REVERSE SHOULDER ARTHROPLASTY BICEPS TENODESIS on 03/02/2022 Patient tolerated  the surgery well. Taken to PACU where she was stabilized and then transferred to the orthopedic floor.  Resumed Plavix '75mg'$  daily following surgery.Marland Kitchen Heels elevated on bed with rolled towels. No evidence of DVT. Negative [place]. Physical therapy started on day #1 for gait training and transfer. OT started day #1 for ADL and assisted devices.  Patient developed increased SOB following surgery.  Chest x-ray negative for acute process.  V/Q scan negative  for PE.  Likely phrenic nerve irritation from interscalene nerve block performed prior to surgery.  Improved on POD3, patient 95% O2 sat on room air.  SOB improved on POD3 while working with both PT and OT.  Patient's IV was removed on POD3.  Implants: All press-fit Arthrex system with a #9 Apex humeral stem, a 36 mm SutureCup with a +6 insert, a +2 mm offset 24 mm base plate with a 25 mm central screw, and a 36 mm  +4 lateralized glenosphere.  She was given perioperative antibiotics:  Anti-infectives (From admission, onward)    Start     Dose/Rate Route Frequency Ordered Stop   03/03/22 1000  hydroxychloroquine (PLAQUENIL) tablet 200 mg        200 mg Oral Daily 03/02/22 1723     03/02/22 1645  ceFAZolin (ANCEF) IVPB 2g/100 mL premix        2 g 200 mL/hr over 30 Minutes Intravenous Every 6 hours 03/02/22 1301 03/03/22 0646   03/02/22 0958  ceFAZolin (ANCEF) 2-4 GM/100ML-% IVPB       Note to Pharmacy: Trudie Reed S: cabinet override      03/02/22 0958 03/02/22 1110   03/02/22 0600  ceFAZolin (ANCEF) IVPB 2g/100 mL premix        2 g 200 mL/hr over 30 Minutes Intravenous On call to O.R. 03/01/22 2212 03/02/22 1043     .  She was given sequential compression devices, early ambulation, and Plavix for DVT prophylaxis.  She benefited maximally from the hospital stay and there were no complications.    Recent vital signs:  Vitals:   03/05/22 0855 03/05/22 1005  BP: (!) 171/84   Pulse: (!) 106   Resp:    Temp: 98.3 F (36.8 C)   SpO2: 97% 95%    Recent laboratory studies:  Lab Results  Component Value Date   HGB 9.3 (L) 03/04/2022   HGB 9.8 (L) 03/03/2022   HGB 10.9 (L) 02/23/2022   Lab Results  Component Value Date   WBC 8.7 03/04/2022   PLT 155 03/04/2022   Lab Results  Component Value Date   INR 1.0 07/15/2021   Lab Results  Component Value Date   NA 144 03/04/2022   K 4.1 03/04/2022   CL 112 (H) 03/04/2022   CO2 25 03/04/2022   BUN 48 (H) 03/04/2022    CREATININE 2.35 (H) 03/04/2022   GLUCOSE 140 (H) 03/04/2022    Discharge Medications:   Allergies as of 03/05/2022       Reactions   Amlodipine Swelling   Sulfa Antibiotics Itching        Medication List     STOP taking these medications    ibuprofen 200 MG tablet Commonly known as: ADVIL       TAKE these medications    acetaminophen 500 MG tablet Commonly known as: TYLENOL Take 2 tablets (1,000 mg total) by mouth every 6 (six) hours as needed.   allopurinol 100 MG tablet Commonly known as: ZYLOPRIM Take 100 mg by mouth every morning.  clopidogrel 75 MG tablet Commonly known as: PLAVIX Take 75 mg by mouth daily.   clotrimazole-betamethasone cream Commonly known as: LOTRISONE Apply 1 application topically 2 (two) times daily as needed for rash.   furosemide 40 MG tablet Commonly known as: LASIX Take 40 mg by mouth every morning.   gabapentin 100 MG capsule Commonly known as: NEURONTIN Take 100 mg by mouth at bedtime as needed (pain).   glimepiride 2 MG tablet Commonly known as: AMARYL Take 1-2 mg by mouth See admin instructions. Take 1 mg daily, may increase to 2 mg if blood sugar is over 175   hydrALAZINE 100 MG tablet Commonly known as: APRESOLINE Take 100 mg by mouth daily.   hydroxychloroquine 200 MG tablet Commonly known as: PLAQUENIL Take 200 mg by mouth daily.   labetalol 200 MG tablet Commonly known as: NORMODYNE Take 200 mg by mouth 2 (two) times daily.   labetalol 300 MG tablet Commonly known as: NORMODYNE Take 300 mg by mouth 2 (two) times daily.   losartan 100 MG tablet Commonly known as: COZAAR Take 100 mg by mouth every morning.   ondansetron 4 MG tablet Commonly known as: ZOFRAN Take 1 tablet (4 mg total) by mouth every 6 (six) hours as needed for nausea.   rosuvastatin 40 MG tablet Commonly known as: CRESTOR Take 40 mg by mouth every morning. Patient reports that she is taking this daily   Symbicort 80-4.5 MCG/ACT  inhaler Generic drug: budesonide-formoterol Inhale 1 puff into the lungs 2 (two) times daily as needed (shortness of breath).   traMADol 50 MG tablet Commonly known as: Ultram Take 1 tablet (50 mg total) by mouth every 6 (six) hours as needed. What changed: Another medication with the same name was added. Make sure you understand how and when to take each.   traMADol 50 MG tablet Commonly known as: Ultram Take 1 tablet (50 mg total) by mouth every 6 (six) hours as needed for moderate pain or severe pain. What changed: You were already taking a medication with the same name, and this prescription was added. Make sure you understand how and when to take each.   tretinoin 0.025 % cream Commonly known as: RETIN-A Apply 1 application topically daily as needed (acne).   zolpidem 5 MG tablet Commonly known as: AMBIEN Take 5 mg by mouth at bedtime.       ASK your doctor about these medications    cephALEXin 250 MG capsule Commonly known as: KEFLEX Take 1 capsule (250 mg total) by mouth 3 (three) times daily for 5 days. Increase WATER intake while on this medication. Ask about: Should I take this medication?               Durable Medical Equipment  (From admission, onward)           Start     Ordered   03/05/22 0959  For home use only DME 4 wheeled rolling walker with seat  Once       Question Answer Comment  Patient needs a walker to treat with the following condition Impaired mobility   Patient needs a walker to treat with the following condition Increased need for rest      03/05/22 0959   03/03/22 1602  For home use only DME Walker rolling  Once       Question Answer Comment  Walker: With Ovid   Patient needs a walker to treat with the following condition Impaired mobility  03/03/22 1602            Diagnostic Studies: NM Pulmonary Perfusion  Result Date: 03/04/2022 CLINICAL DATA:  Status post reverse total right shoulder arthroplasty  03/02/2022. Increased shortness of breath. EXAM: NUCLEAR MEDICINE PERFUSION LUNG SCAN TECHNIQUE: Perfusion images were obtained in multiple projections after intravenous injection of radiopharmaceutical. Ventilation scans intentionally deferred if perfusion scan and chest x-ray adequate for interpretation during COVID 19 epidemic. RADIOPHARMACEUTICALS:  3.65 mCi Tc-26mMAA IV COMPARISON:  None available; correlation is made with AP chest 03/03/2022 and 07/15/2021 FINDINGS: There is mild heterogeneity of the perfusion radiotracer within the bilateral lungs. No large segmental filling defect is seen. IMPRESSION: By modified PIOPED II perfusion only criteria, very low probability for pulmonary embolism. Electronically Signed   By: RYvonne KendallM.D.   On: 03/04/2022 14:59   DG Chest Port 1 View  Result Date: 03/03/2022 CLINICAL DATA:  Shortness of breath after having shoulder surgery yesterday EXAM: PORTABLE CHEST 1 VIEW COMPARISON:  Chest radiograph 08/20/2016, CT chest 09/08/2018 FINDINGS: No pleural effusion. No pneumothorax. Low lung volumes. Overlying external material slightly limits evaluation for focal airspace opacities. Within this limitation, there is no focal airspace opacity. Cardiac and mediastinal contours are likely unchanged compared to prior exam when accounting for differences in lung volumes. Partially visualized right shoulder arthroplasty as well as cervical spinal fusion hardware. IMPRESSION: No active disease. Electronically Signed   By: HMarin RobertsM.D.   On: 03/03/2022 14:46   DG Shoulder Right Port  Result Date: 03/02/2022 CLINICAL DATA:  Reversed total right shoulder arthroplasty EXAM: RIGHT SHOULDER - 1 VIEW COMPARISON:  CT scan 01/25/2022 FINDINGS: Well seated components of a total right shoulder arthroplasty. No complicating features are identified. IMPRESSION: Well seated components of a total right shoulder arthroplasty. Electronically Signed   By: PMarijo SanesM.D.   On:  03/02/2022 14:18   [place] NERVE BLOCK-IMAGE ONLY ([facility]  Result Date: 03/02/2022 There is no interpretation for this exam.  This order is for images obtained during a surgical procedure.  Please See "Surgeries" Tab for more information regarding the procedure.    Disposition: Plan for discharge home with HHPT.   Follow-up Information     Poggi, JMarshall Cork MD Follow up.   Specialty: Orthopedic Surgery Why: keep appointments as scheduled 03/17/22 with LCameron ProudPA  03/17/22 with TAlbertina ParrPT Contact information: [phone]               Signed: JJudson RochPA-C 03/05/2022, 10:52 AM

## 2022-03-06 DIAGNOSIS — M069 Rheumatoid arthritis, unspecified: Secondary | ICD-10-CM | POA: Diagnosis not present

## 2022-03-06 DIAGNOSIS — G5600 Carpal tunnel syndrome, unspecified upper limb: Secondary | ICD-10-CM | POA: Diagnosis not present

## 2022-03-06 DIAGNOSIS — N184 Chronic kidney disease, stage 4 (severe): Secondary | ICD-10-CM | POA: Diagnosis not present

## 2022-03-06 DIAGNOSIS — Z471 Aftercare following joint replacement surgery: Secondary | ICD-10-CM | POA: Diagnosis not present

## 2022-03-06 DIAGNOSIS — M4802 Spinal stenosis, cervical region: Secondary | ICD-10-CM | POA: Diagnosis not present

## 2022-03-06 DIAGNOSIS — R011 Cardiac murmur, unspecified: Secondary | ICD-10-CM | POA: Diagnosis not present

## 2022-03-06 DIAGNOSIS — N2581 Secondary hyperparathyroidism of renal origin: Secondary | ICD-10-CM | POA: Diagnosis not present

## 2022-03-06 DIAGNOSIS — M25561 Pain in right knee: Secondary | ICD-10-CM | POA: Diagnosis not present

## 2022-03-06 DIAGNOSIS — K219 Gastro-esophageal reflux disease without esophagitis: Secondary | ICD-10-CM | POA: Diagnosis not present

## 2022-03-06 DIAGNOSIS — D631 Anemia in chronic kidney disease: Secondary | ICD-10-CM | POA: Diagnosis not present

## 2022-03-06 DIAGNOSIS — E1122 Type 2 diabetes mellitus with diabetic chronic kidney disease: Secondary | ICD-10-CM | POA: Diagnosis not present

## 2022-03-06 DIAGNOSIS — M4312 Spondylolisthesis, cervical region: Secondary | ICD-10-CM | POA: Diagnosis not present

## 2022-03-06 DIAGNOSIS — K862 Cyst of pancreas: Secondary | ICD-10-CM | POA: Diagnosis not present

## 2022-03-06 DIAGNOSIS — G4733 Obstructive sleep apnea (adult) (pediatric): Secondary | ICD-10-CM | POA: Diagnosis not present

## 2022-03-06 DIAGNOSIS — I1310 Hypertensive heart and chronic kidney disease without heart failure, with stage 1 through stage 4 chronic kidney disease, or unspecified chronic kidney disease: Secondary | ICD-10-CM | POA: Diagnosis not present

## 2022-03-06 DIAGNOSIS — M329 Systemic lupus erythematosus, unspecified: Secondary | ICD-10-CM | POA: Diagnosis not present

## 2022-03-06 DIAGNOSIS — M5136 Other intervertebral disc degeneration, lumbar region: Secondary | ICD-10-CM | POA: Diagnosis not present

## 2022-03-06 DIAGNOSIS — G4700 Insomnia, unspecified: Secondary | ICD-10-CM | POA: Diagnosis not present

## 2022-03-06 DIAGNOSIS — G43909 Migraine, unspecified, not intractable, without status migrainosus: Secondary | ICD-10-CM | POA: Diagnosis not present

## 2022-03-06 DIAGNOSIS — I4510 Unspecified right bundle-branch block: Secondary | ICD-10-CM | POA: Diagnosis not present

## 2022-03-06 DIAGNOSIS — E785 Hyperlipidemia, unspecified: Secondary | ICD-10-CM | POA: Diagnosis not present

## 2022-03-06 DIAGNOSIS — K8020 Calculus of gallbladder without cholecystitis without obstruction: Secondary | ICD-10-CM | POA: Diagnosis not present

## 2022-03-06 DIAGNOSIS — M109 Gout, unspecified: Secondary | ICD-10-CM | POA: Diagnosis not present

## 2022-03-06 DIAGNOSIS — I2510 Atherosclerotic heart disease of native coronary artery without angina pectoris: Secondary | ICD-10-CM | POA: Diagnosis not present

## 2022-03-09 ENCOUNTER — Encounter: Payer: Self-pay | Admitting: Surgery

## 2022-03-09 DIAGNOSIS — G4733 Obstructive sleep apnea (adult) (pediatric): Secondary | ICD-10-CM | POA: Diagnosis not present

## 2022-03-09 DIAGNOSIS — M069 Rheumatoid arthritis, unspecified: Secondary | ICD-10-CM | POA: Diagnosis not present

## 2022-03-09 DIAGNOSIS — E1122 Type 2 diabetes mellitus with diabetic chronic kidney disease: Secondary | ICD-10-CM | POA: Diagnosis not present

## 2022-03-09 DIAGNOSIS — M5136 Other intervertebral disc degeneration, lumbar region: Secondary | ICD-10-CM | POA: Diagnosis not present

## 2022-03-09 DIAGNOSIS — M25561 Pain in right knee: Secondary | ICD-10-CM | POA: Diagnosis not present

## 2022-03-09 DIAGNOSIS — K862 Cyst of pancreas: Secondary | ICD-10-CM | POA: Diagnosis not present

## 2022-03-09 DIAGNOSIS — E785 Hyperlipidemia, unspecified: Secondary | ICD-10-CM | POA: Diagnosis not present

## 2022-03-09 DIAGNOSIS — K219 Gastro-esophageal reflux disease without esophagitis: Secondary | ICD-10-CM | POA: Diagnosis not present

## 2022-03-09 DIAGNOSIS — N2581 Secondary hyperparathyroidism of renal origin: Secondary | ICD-10-CM | POA: Diagnosis not present

## 2022-03-09 DIAGNOSIS — I4510 Unspecified right bundle-branch block: Secondary | ICD-10-CM | POA: Diagnosis not present

## 2022-03-09 DIAGNOSIS — N184 Chronic kidney disease, stage 4 (severe): Secondary | ICD-10-CM | POA: Diagnosis not present

## 2022-03-09 DIAGNOSIS — I1310 Hypertensive heart and chronic kidney disease without heart failure, with stage 1 through stage 4 chronic kidney disease, or unspecified chronic kidney disease: Secondary | ICD-10-CM | POA: Diagnosis not present

## 2022-03-09 DIAGNOSIS — M4802 Spinal stenosis, cervical region: Secondary | ICD-10-CM | POA: Diagnosis not present

## 2022-03-09 DIAGNOSIS — R011 Cardiac murmur, unspecified: Secondary | ICD-10-CM | POA: Diagnosis not present

## 2022-03-09 DIAGNOSIS — K8020 Calculus of gallbladder without cholecystitis without obstruction: Secondary | ICD-10-CM | POA: Diagnosis not present

## 2022-03-09 DIAGNOSIS — D631 Anemia in chronic kidney disease: Secondary | ICD-10-CM | POA: Diagnosis not present

## 2022-03-09 DIAGNOSIS — G4700 Insomnia, unspecified: Secondary | ICD-10-CM | POA: Diagnosis not present

## 2022-03-09 DIAGNOSIS — M329 Systemic lupus erythematosus, unspecified: Secondary | ICD-10-CM | POA: Diagnosis not present

## 2022-03-09 DIAGNOSIS — M109 Gout, unspecified: Secondary | ICD-10-CM | POA: Diagnosis not present

## 2022-03-09 DIAGNOSIS — G43909 Migraine, unspecified, not intractable, without status migrainosus: Secondary | ICD-10-CM | POA: Diagnosis not present

## 2022-03-09 DIAGNOSIS — Z471 Aftercare following joint replacement surgery: Secondary | ICD-10-CM | POA: Diagnosis not present

## 2022-03-09 DIAGNOSIS — I2510 Atherosclerotic heart disease of native coronary artery without angina pectoris: Secondary | ICD-10-CM | POA: Diagnosis not present

## 2022-03-09 DIAGNOSIS — M4312 Spondylolisthesis, cervical region: Secondary | ICD-10-CM | POA: Diagnosis not present

## 2022-03-09 DIAGNOSIS — G5600 Carpal tunnel syndrome, unspecified upper limb: Secondary | ICD-10-CM | POA: Diagnosis not present

## 2022-03-10 DIAGNOSIS — M4312 Spondylolisthesis, cervical region: Secondary | ICD-10-CM | POA: Diagnosis not present

## 2022-03-10 DIAGNOSIS — K219 Gastro-esophageal reflux disease without esophagitis: Secondary | ICD-10-CM | POA: Diagnosis not present

## 2022-03-10 DIAGNOSIS — M4802 Spinal stenosis, cervical region: Secondary | ICD-10-CM | POA: Diagnosis not present

## 2022-03-10 DIAGNOSIS — M069 Rheumatoid arthritis, unspecified: Secondary | ICD-10-CM | POA: Diagnosis not present

## 2022-03-10 DIAGNOSIS — K862 Cyst of pancreas: Secondary | ICD-10-CM | POA: Diagnosis not present

## 2022-03-10 DIAGNOSIS — I4510 Unspecified right bundle-branch block: Secondary | ICD-10-CM | POA: Diagnosis not present

## 2022-03-10 DIAGNOSIS — G5600 Carpal tunnel syndrome, unspecified upper limb: Secondary | ICD-10-CM | POA: Diagnosis not present

## 2022-03-10 DIAGNOSIS — N2581 Secondary hyperparathyroidism of renal origin: Secondary | ICD-10-CM | POA: Diagnosis not present

## 2022-03-10 DIAGNOSIS — I2510 Atherosclerotic heart disease of native coronary artery without angina pectoris: Secondary | ICD-10-CM | POA: Diagnosis not present

## 2022-03-10 DIAGNOSIS — M109 Gout, unspecified: Secondary | ICD-10-CM | POA: Diagnosis not present

## 2022-03-10 DIAGNOSIS — M5136 Other intervertebral disc degeneration, lumbar region: Secondary | ICD-10-CM | POA: Diagnosis not present

## 2022-03-10 DIAGNOSIS — G43909 Migraine, unspecified, not intractable, without status migrainosus: Secondary | ICD-10-CM | POA: Diagnosis not present

## 2022-03-10 DIAGNOSIS — E1122 Type 2 diabetes mellitus with diabetic chronic kidney disease: Secondary | ICD-10-CM | POA: Diagnosis not present

## 2022-03-10 DIAGNOSIS — E785 Hyperlipidemia, unspecified: Secondary | ICD-10-CM | POA: Diagnosis not present

## 2022-03-10 DIAGNOSIS — N184 Chronic kidney disease, stage 4 (severe): Secondary | ICD-10-CM | POA: Diagnosis not present

## 2022-03-10 DIAGNOSIS — K8020 Calculus of gallbladder without cholecystitis without obstruction: Secondary | ICD-10-CM | POA: Diagnosis not present

## 2022-03-10 DIAGNOSIS — I1310 Hypertensive heart and chronic kidney disease without heart failure, with stage 1 through stage 4 chronic kidney disease, or unspecified chronic kidney disease: Secondary | ICD-10-CM | POA: Diagnosis not present

## 2022-03-10 DIAGNOSIS — G4700 Insomnia, unspecified: Secondary | ICD-10-CM | POA: Diagnosis not present

## 2022-03-10 DIAGNOSIS — Z471 Aftercare following joint replacement surgery: Secondary | ICD-10-CM | POA: Diagnosis not present

## 2022-03-10 DIAGNOSIS — M329 Systemic lupus erythematosus, unspecified: Secondary | ICD-10-CM | POA: Diagnosis not present

## 2022-03-10 DIAGNOSIS — M25561 Pain in right knee: Secondary | ICD-10-CM | POA: Diagnosis not present

## 2022-03-10 DIAGNOSIS — D631 Anemia in chronic kidney disease: Secondary | ICD-10-CM | POA: Diagnosis not present

## 2022-03-10 DIAGNOSIS — R011 Cardiac murmur, unspecified: Secondary | ICD-10-CM | POA: Diagnosis not present

## 2022-03-10 DIAGNOSIS — G4733 Obstructive sleep apnea (adult) (pediatric): Secondary | ICD-10-CM | POA: Diagnosis not present

## 2022-03-12 DIAGNOSIS — I1310 Hypertensive heart and chronic kidney disease without heart failure, with stage 1 through stage 4 chronic kidney disease, or unspecified chronic kidney disease: Secondary | ICD-10-CM | POA: Diagnosis not present

## 2022-03-12 DIAGNOSIS — G5600 Carpal tunnel syndrome, unspecified upper limb: Secondary | ICD-10-CM | POA: Diagnosis not present

## 2022-03-12 DIAGNOSIS — M069 Rheumatoid arthritis, unspecified: Secondary | ICD-10-CM | POA: Diagnosis not present

## 2022-03-12 DIAGNOSIS — N2581 Secondary hyperparathyroidism of renal origin: Secondary | ICD-10-CM | POA: Diagnosis not present

## 2022-03-12 DIAGNOSIS — Z471 Aftercare following joint replacement surgery: Secondary | ICD-10-CM | POA: Diagnosis not present

## 2022-03-12 DIAGNOSIS — I2510 Atherosclerotic heart disease of native coronary artery without angina pectoris: Secondary | ICD-10-CM | POA: Diagnosis not present

## 2022-03-12 DIAGNOSIS — K219 Gastro-esophageal reflux disease without esophagitis: Secondary | ICD-10-CM | POA: Diagnosis not present

## 2022-03-12 DIAGNOSIS — K8020 Calculus of gallbladder without cholecystitis without obstruction: Secondary | ICD-10-CM | POA: Diagnosis not present

## 2022-03-12 DIAGNOSIS — G4700 Insomnia, unspecified: Secondary | ICD-10-CM | POA: Diagnosis not present

## 2022-03-12 DIAGNOSIS — G43909 Migraine, unspecified, not intractable, without status migrainosus: Secondary | ICD-10-CM | POA: Diagnosis not present

## 2022-03-12 DIAGNOSIS — R011 Cardiac murmur, unspecified: Secondary | ICD-10-CM | POA: Diagnosis not present

## 2022-03-12 DIAGNOSIS — M4802 Spinal stenosis, cervical region: Secondary | ICD-10-CM | POA: Diagnosis not present

## 2022-03-12 DIAGNOSIS — M4312 Spondylolisthesis, cervical region: Secondary | ICD-10-CM | POA: Diagnosis not present

## 2022-03-12 DIAGNOSIS — M5136 Other intervertebral disc degeneration, lumbar region: Secondary | ICD-10-CM | POA: Diagnosis not present

## 2022-03-12 DIAGNOSIS — M25561 Pain in right knee: Secondary | ICD-10-CM | POA: Diagnosis not present

## 2022-03-12 DIAGNOSIS — M329 Systemic lupus erythematosus, unspecified: Secondary | ICD-10-CM | POA: Diagnosis not present

## 2022-03-12 DIAGNOSIS — E1122 Type 2 diabetes mellitus with diabetic chronic kidney disease: Secondary | ICD-10-CM | POA: Diagnosis not present

## 2022-03-12 DIAGNOSIS — E785 Hyperlipidemia, unspecified: Secondary | ICD-10-CM | POA: Diagnosis not present

## 2022-03-12 DIAGNOSIS — I4510 Unspecified right bundle-branch block: Secondary | ICD-10-CM | POA: Diagnosis not present

## 2022-03-12 DIAGNOSIS — D631 Anemia in chronic kidney disease: Secondary | ICD-10-CM | POA: Diagnosis not present

## 2022-03-12 DIAGNOSIS — M109 Gout, unspecified: Secondary | ICD-10-CM | POA: Diagnosis not present

## 2022-03-12 DIAGNOSIS — N184 Chronic kidney disease, stage 4 (severe): Secondary | ICD-10-CM | POA: Diagnosis not present

## 2022-03-12 DIAGNOSIS — K862 Cyst of pancreas: Secondary | ICD-10-CM | POA: Diagnosis not present

## 2022-03-12 DIAGNOSIS — G4733 Obstructive sleep apnea (adult) (pediatric): Secondary | ICD-10-CM | POA: Diagnosis not present

## 2022-03-15 DIAGNOSIS — N184 Chronic kidney disease, stage 4 (severe): Secondary | ICD-10-CM | POA: Diagnosis not present

## 2022-03-15 DIAGNOSIS — G4733 Obstructive sleep apnea (adult) (pediatric): Secondary | ICD-10-CM | POA: Diagnosis not present

## 2022-03-15 DIAGNOSIS — N2581 Secondary hyperparathyroidism of renal origin: Secondary | ICD-10-CM | POA: Diagnosis not present

## 2022-03-15 DIAGNOSIS — M329 Systemic lupus erythematosus, unspecified: Secondary | ICD-10-CM | POA: Diagnosis not present

## 2022-03-15 DIAGNOSIS — G43909 Migraine, unspecified, not intractable, without status migrainosus: Secondary | ICD-10-CM | POA: Diagnosis not present

## 2022-03-15 DIAGNOSIS — M25561 Pain in right knee: Secondary | ICD-10-CM | POA: Diagnosis not present

## 2022-03-15 DIAGNOSIS — E1122 Type 2 diabetes mellitus with diabetic chronic kidney disease: Secondary | ICD-10-CM | POA: Diagnosis not present

## 2022-03-15 DIAGNOSIS — K862 Cyst of pancreas: Secondary | ICD-10-CM | POA: Diagnosis not present

## 2022-03-15 DIAGNOSIS — E785 Hyperlipidemia, unspecified: Secondary | ICD-10-CM | POA: Diagnosis not present

## 2022-03-15 DIAGNOSIS — M5136 Other intervertebral disc degeneration, lumbar region: Secondary | ICD-10-CM | POA: Diagnosis not present

## 2022-03-15 DIAGNOSIS — D631 Anemia in chronic kidney disease: Secondary | ICD-10-CM | POA: Diagnosis not present

## 2022-03-15 DIAGNOSIS — G5600 Carpal tunnel syndrome, unspecified upper limb: Secondary | ICD-10-CM | POA: Diagnosis not present

## 2022-03-15 DIAGNOSIS — G4700 Insomnia, unspecified: Secondary | ICD-10-CM | POA: Diagnosis not present

## 2022-03-15 DIAGNOSIS — M069 Rheumatoid arthritis, unspecified: Secondary | ICD-10-CM | POA: Diagnosis not present

## 2022-03-15 DIAGNOSIS — I4510 Unspecified right bundle-branch block: Secondary | ICD-10-CM | POA: Diagnosis not present

## 2022-03-15 DIAGNOSIS — M4802 Spinal stenosis, cervical region: Secondary | ICD-10-CM | POA: Diagnosis not present

## 2022-03-15 DIAGNOSIS — K8020 Calculus of gallbladder without cholecystitis without obstruction: Secondary | ICD-10-CM | POA: Diagnosis not present

## 2022-03-15 DIAGNOSIS — I1310 Hypertensive heart and chronic kidney disease without heart failure, with stage 1 through stage 4 chronic kidney disease, or unspecified chronic kidney disease: Secondary | ICD-10-CM | POA: Diagnosis not present

## 2022-03-15 DIAGNOSIS — Z471 Aftercare following joint replacement surgery: Secondary | ICD-10-CM | POA: Diagnosis not present

## 2022-03-15 DIAGNOSIS — R011 Cardiac murmur, unspecified: Secondary | ICD-10-CM | POA: Diagnosis not present

## 2022-03-15 DIAGNOSIS — M109 Gout, unspecified: Secondary | ICD-10-CM | POA: Diagnosis not present

## 2022-03-15 DIAGNOSIS — I2510 Atherosclerotic heart disease of native coronary artery without angina pectoris: Secondary | ICD-10-CM | POA: Diagnosis not present

## 2022-03-15 DIAGNOSIS — K219 Gastro-esophageal reflux disease without esophagitis: Secondary | ICD-10-CM | POA: Diagnosis not present

## 2022-03-15 DIAGNOSIS — M4312 Spondylolisthesis, cervical region: Secondary | ICD-10-CM | POA: Diagnosis not present

## 2022-03-16 ENCOUNTER — Ambulatory Visit: Payer: Self-pay | Admitting: *Deleted

## 2022-03-16 NOTE — Patient Outreach (Signed)
  Care Coordination   Follow Up Visit Note   03/16/2022 Name: ABBAGALE Leon MRN: [id number] DOB: 1945/11/25  [name] is a 76 y.o. year old female who sees Perrin Maltese, MD for primary care. I spoke with  [name] by phone today.  What matters to the patients health and wellness today?  Additional hours for personal care services    Goals Addressed             This Visit's Progress    In home care needs       Care Coordination Interventions: Patient confirmed plan that she did have surgery on shoulder 03/02/22 , PT services in place, patient asking that the services be extended Patient also asking for additional in home care/personal care hours through [facility], she is currently receiving 26 hours per month, would like that extended due to limited use of her hands Patient confirms that her daughter is available for assistance, however she works and needs additional help with her ADL's-patient's daughter will provide transport and will assist with care following her surgery as she is able This Education officer, museum explained that in order to receive additional hours for personal care services, she will need to be re-evaluated [place] This social worker will send request form to patient's provider to request another assessment for personal care services        SDOH assessments and interventions completed:  No     Care Coordination Interventions Activated:  Yes  Care Coordination Interventions:  Yes, provided   Follow up plan: Follow up call scheduled for 03/30/22    Encounter Outcome:  Pt. Visit Completed

## 2022-03-16 NOTE — Patient Instructions (Signed)
Visit Information  Thank you for taking time to visit with me today. Please don't hesitate to contact me if I can be of assistance to you.   Following are the goals we discussed today:   Goals Addressed             This Visit's Progress    In home care needs       Care Coordination Interventions: Patient confirmed plan that she did have surgery on shoulder 03/02/22 , PT services in place, patient asking that the services be extended Patient also asking for additional in home care/personal care hours through [facility], she is currently receiving 26 hours per month, would like that extended due to limited use of her hands Patient confirms that her daughter is available for assistance, however she works and needs additional help with her ADL's-patient's daughter will provide transport and will assist with care following her surgery as she is able This Education officer, museum explained that in order to receive additional hours for personal care services, she will need to be re-evaluated [place] This social worker will send request form to patient's provider to request another assessment for personal care services        Our next appointment is by telephone on 03/30/22 at 11am  Please call the care guide team at [phone] if you need to cancel or reschedule your appointment.   If you are experiencing a Mental Health or [place] or need someone to talk to, please call the Suicide and Crisis Lifeline: 988 call 911   Patient verbalizes understanding of instructions and care plan provided today and agrees to view in [place]. Active MyChart status and patient understanding of how to access instructions and care plan via MyChart confirmed with patient.     Telephone follow up appointment with care management team member scheduled for: 03/30/22 Elliot Gurney, [place] Worker  [facility] Care Management [phone]

## 2022-03-17 DIAGNOSIS — K219 Gastro-esophageal reflux disease without esophagitis: Secondary | ICD-10-CM | POA: Diagnosis not present

## 2022-03-17 DIAGNOSIS — M4312 Spondylolisthesis, cervical region: Secondary | ICD-10-CM | POA: Diagnosis not present

## 2022-03-17 DIAGNOSIS — I1310 Hypertensive heart and chronic kidney disease without heart failure, with stage 1 through stage 4 chronic kidney disease, or unspecified chronic kidney disease: Secondary | ICD-10-CM | POA: Diagnosis not present

## 2022-03-17 DIAGNOSIS — D631 Anemia in chronic kidney disease: Secondary | ICD-10-CM | POA: Diagnosis not present

## 2022-03-17 DIAGNOSIS — K862 Cyst of pancreas: Secondary | ICD-10-CM | POA: Diagnosis not present

## 2022-03-17 DIAGNOSIS — I2510 Atherosclerotic heart disease of native coronary artery without angina pectoris: Secondary | ICD-10-CM | POA: Diagnosis not present

## 2022-03-17 DIAGNOSIS — G4700 Insomnia, unspecified: Secondary | ICD-10-CM | POA: Diagnosis not present

## 2022-03-17 DIAGNOSIS — E785 Hyperlipidemia, unspecified: Secondary | ICD-10-CM | POA: Diagnosis not present

## 2022-03-17 DIAGNOSIS — R011 Cardiac murmur, unspecified: Secondary | ICD-10-CM | POA: Diagnosis not present

## 2022-03-17 DIAGNOSIS — K8020 Calculus of gallbladder without cholecystitis without obstruction: Secondary | ICD-10-CM | POA: Diagnosis not present

## 2022-03-17 DIAGNOSIS — G4733 Obstructive sleep apnea (adult) (pediatric): Secondary | ICD-10-CM | POA: Diagnosis not present

## 2022-03-17 DIAGNOSIS — M329 Systemic lupus erythematosus, unspecified: Secondary | ICD-10-CM | POA: Diagnosis not present

## 2022-03-17 DIAGNOSIS — M4802 Spinal stenosis, cervical region: Secondary | ICD-10-CM | POA: Diagnosis not present

## 2022-03-17 DIAGNOSIS — M069 Rheumatoid arthritis, unspecified: Secondary | ICD-10-CM | POA: Diagnosis not present

## 2022-03-17 DIAGNOSIS — M25561 Pain in right knee: Secondary | ICD-10-CM | POA: Diagnosis not present

## 2022-03-17 DIAGNOSIS — Z471 Aftercare following joint replacement surgery: Secondary | ICD-10-CM | POA: Diagnosis not present

## 2022-03-17 DIAGNOSIS — N184 Chronic kidney disease, stage 4 (severe): Secondary | ICD-10-CM | POA: Diagnosis not present

## 2022-03-17 DIAGNOSIS — E1122 Type 2 diabetes mellitus with diabetic chronic kidney disease: Secondary | ICD-10-CM | POA: Diagnosis not present

## 2022-03-17 DIAGNOSIS — G43909 Migraine, unspecified, not intractable, without status migrainosus: Secondary | ICD-10-CM | POA: Diagnosis not present

## 2022-03-17 DIAGNOSIS — G5600 Carpal tunnel syndrome, unspecified upper limb: Secondary | ICD-10-CM | POA: Diagnosis not present

## 2022-03-17 DIAGNOSIS — M109 Gout, unspecified: Secondary | ICD-10-CM | POA: Diagnosis not present

## 2022-03-17 DIAGNOSIS — I4510 Unspecified right bundle-branch block: Secondary | ICD-10-CM | POA: Diagnosis not present

## 2022-03-17 DIAGNOSIS — N2581 Secondary hyperparathyroidism of renal origin: Secondary | ICD-10-CM | POA: Diagnosis not present

## 2022-03-17 DIAGNOSIS — M5136 Other intervertebral disc degeneration, lumbar region: Secondary | ICD-10-CM | POA: Diagnosis not present

## 2022-03-18 DIAGNOSIS — M109 Gout, unspecified: Secondary | ICD-10-CM | POA: Diagnosis not present

## 2022-03-18 DIAGNOSIS — K219 Gastro-esophageal reflux disease without esophagitis: Secondary | ICD-10-CM | POA: Diagnosis not present

## 2022-03-18 DIAGNOSIS — G43909 Migraine, unspecified, not intractable, without status migrainosus: Secondary | ICD-10-CM | POA: Diagnosis not present

## 2022-03-18 DIAGNOSIS — N2581 Secondary hyperparathyroidism of renal origin: Secondary | ICD-10-CM | POA: Diagnosis not present

## 2022-03-18 DIAGNOSIS — I10 Essential (primary) hypertension: Secondary | ICD-10-CM | POA: Diagnosis not present

## 2022-03-18 DIAGNOSIS — E782 Mixed hyperlipidemia: Secondary | ICD-10-CM | POA: Diagnosis not present

## 2022-03-18 DIAGNOSIS — I4510 Unspecified right bundle-branch block: Secondary | ICD-10-CM | POA: Diagnosis not present

## 2022-03-18 DIAGNOSIS — R011 Cardiac murmur, unspecified: Secondary | ICD-10-CM | POA: Diagnosis not present

## 2022-03-18 DIAGNOSIS — J42 Unspecified chronic bronchitis: Secondary | ICD-10-CM | POA: Diagnosis not present

## 2022-03-18 DIAGNOSIS — D631 Anemia in chronic kidney disease: Secondary | ICD-10-CM | POA: Diagnosis not present

## 2022-03-18 DIAGNOSIS — M4312 Spondylolisthesis, cervical region: Secondary | ICD-10-CM | POA: Diagnosis not present

## 2022-03-18 DIAGNOSIS — M329 Systemic lupus erythematosus, unspecified: Secondary | ICD-10-CM | POA: Diagnosis not present

## 2022-03-18 DIAGNOSIS — N184 Chronic kidney disease, stage 4 (severe): Secondary | ICD-10-CM | POA: Diagnosis not present

## 2022-03-18 DIAGNOSIS — M069 Rheumatoid arthritis, unspecified: Secondary | ICD-10-CM | POA: Diagnosis not present

## 2022-03-18 DIAGNOSIS — G4700 Insomnia, unspecified: Secondary | ICD-10-CM | POA: Diagnosis not present

## 2022-03-18 DIAGNOSIS — Z471 Aftercare following joint replacement surgery: Secondary | ICD-10-CM | POA: Diagnosis not present

## 2022-03-18 DIAGNOSIS — M5136 Other intervertebral disc degeneration, lumbar region: Secondary | ICD-10-CM | POA: Diagnosis not present

## 2022-03-18 DIAGNOSIS — G4733 Obstructive sleep apnea (adult) (pediatric): Secondary | ICD-10-CM | POA: Diagnosis not present

## 2022-03-18 DIAGNOSIS — I2510 Atherosclerotic heart disease of native coronary artery without angina pectoris: Secondary | ICD-10-CM | POA: Diagnosis not present

## 2022-03-18 DIAGNOSIS — M25561 Pain in right knee: Secondary | ICD-10-CM | POA: Diagnosis not present

## 2022-03-18 DIAGNOSIS — G5600 Carpal tunnel syndrome, unspecified upper limb: Secondary | ICD-10-CM | POA: Diagnosis not present

## 2022-03-18 DIAGNOSIS — I1310 Hypertensive heart and chronic kidney disease without heart failure, with stage 1 through stage 4 chronic kidney disease, or unspecified chronic kidney disease: Secondary | ICD-10-CM | POA: Diagnosis not present

## 2022-03-18 DIAGNOSIS — K8020 Calculus of gallbladder without cholecystitis without obstruction: Secondary | ICD-10-CM | POA: Diagnosis not present

## 2022-03-18 DIAGNOSIS — E1122 Type 2 diabetes mellitus with diabetic chronic kidney disease: Secondary | ICD-10-CM | POA: Diagnosis not present

## 2022-03-18 DIAGNOSIS — E785 Hyperlipidemia, unspecified: Secondary | ICD-10-CM | POA: Diagnosis not present

## 2022-03-18 DIAGNOSIS — K862 Cyst of pancreas: Secondary | ICD-10-CM | POA: Diagnosis not present

## 2022-03-18 DIAGNOSIS — M4802 Spinal stenosis, cervical region: Secondary | ICD-10-CM | POA: Diagnosis not present

## 2022-03-19 ENCOUNTER — Ambulatory Visit: Payer: Self-pay | Admitting: *Deleted

## 2022-03-19 DIAGNOSIS — M4312 Spondylolisthesis, cervical region: Secondary | ICD-10-CM | POA: Diagnosis not present

## 2022-03-19 DIAGNOSIS — E1122 Type 2 diabetes mellitus with diabetic chronic kidney disease: Secondary | ICD-10-CM | POA: Diagnosis not present

## 2022-03-19 DIAGNOSIS — K8020 Calculus of gallbladder without cholecystitis without obstruction: Secondary | ICD-10-CM | POA: Diagnosis not present

## 2022-03-19 DIAGNOSIS — G5600 Carpal tunnel syndrome, unspecified upper limb: Secondary | ICD-10-CM | POA: Diagnosis not present

## 2022-03-19 DIAGNOSIS — Z471 Aftercare following joint replacement surgery: Secondary | ICD-10-CM | POA: Diagnosis not present

## 2022-03-19 DIAGNOSIS — E785 Hyperlipidemia, unspecified: Secondary | ICD-10-CM | POA: Diagnosis not present

## 2022-03-19 DIAGNOSIS — N184 Chronic kidney disease, stage 4 (severe): Secondary | ICD-10-CM | POA: Diagnosis not present

## 2022-03-19 DIAGNOSIS — M069 Rheumatoid arthritis, unspecified: Secondary | ICD-10-CM | POA: Diagnosis not present

## 2022-03-19 DIAGNOSIS — M4802 Spinal stenosis, cervical region: Secondary | ICD-10-CM | POA: Diagnosis not present

## 2022-03-19 DIAGNOSIS — D631 Anemia in chronic kidney disease: Secondary | ICD-10-CM | POA: Diagnosis not present

## 2022-03-19 DIAGNOSIS — I2510 Atherosclerotic heart disease of native coronary artery without angina pectoris: Secondary | ICD-10-CM | POA: Diagnosis not present

## 2022-03-19 DIAGNOSIS — G4733 Obstructive sleep apnea (adult) (pediatric): Secondary | ICD-10-CM | POA: Diagnosis not present

## 2022-03-19 DIAGNOSIS — I4510 Unspecified right bundle-branch block: Secondary | ICD-10-CM | POA: Diagnosis not present

## 2022-03-19 DIAGNOSIS — I1310 Hypertensive heart and chronic kidney disease without heart failure, with stage 1 through stage 4 chronic kidney disease, or unspecified chronic kidney disease: Secondary | ICD-10-CM | POA: Diagnosis not present

## 2022-03-19 DIAGNOSIS — K862 Cyst of pancreas: Secondary | ICD-10-CM | POA: Diagnosis not present

## 2022-03-19 DIAGNOSIS — M109 Gout, unspecified: Secondary | ICD-10-CM | POA: Diagnosis not present

## 2022-03-19 DIAGNOSIS — M25561 Pain in right knee: Secondary | ICD-10-CM | POA: Diagnosis not present

## 2022-03-19 DIAGNOSIS — N2581 Secondary hyperparathyroidism of renal origin: Secondary | ICD-10-CM | POA: Diagnosis not present

## 2022-03-19 DIAGNOSIS — M329 Systemic lupus erythematosus, unspecified: Secondary | ICD-10-CM | POA: Diagnosis not present

## 2022-03-19 DIAGNOSIS — R011 Cardiac murmur, unspecified: Secondary | ICD-10-CM | POA: Diagnosis not present

## 2022-03-19 DIAGNOSIS — G4700 Insomnia, unspecified: Secondary | ICD-10-CM | POA: Diagnosis not present

## 2022-03-19 DIAGNOSIS — K219 Gastro-esophageal reflux disease without esophagitis: Secondary | ICD-10-CM | POA: Diagnosis not present

## 2022-03-19 DIAGNOSIS — G43909 Migraine, unspecified, not intractable, without status migrainosus: Secondary | ICD-10-CM | POA: Diagnosis not present

## 2022-03-19 DIAGNOSIS — M5136 Other intervertebral disc degeneration, lumbar region: Secondary | ICD-10-CM | POA: Diagnosis not present

## 2022-03-19 NOTE — Patient Outreach (Signed)
  Care Coordination   Follow Up Visit Note   03/19/2022 Name: [name] MRN: [id number] DOB: 1946-01-03  [name] is a 76 y.o. year old female who sees Perrin Maltese, MD for primary care. I spoke with  [name] by phone today.  What matters to the patients health and wellness today?  In home care    Goals Addressed             This Visit's Progress    In home care needs       Care Coordination Interventions: Patient confirmed plan that she did have surgery on shoulder 03/02/22 , PT services in place, services have been extended Patient satisfied with [facility], she is currently receiving 26 hours per month, however they did not show up this week-patient also concerned that the aid that had been coming lacks initiative  Patient confirms that her daughter has been  assisting, however she works and needs additional help with her ADL's Collaboration phone call to [facility] to discuss no show from [place] a message requesting a return call.  This Education officer, museum will follow up with patient once call is returned by [facility]        SDOH assessments and interventions completed:  No     Care Coordination Interventions Activated:  Yes  Care Coordination Interventions:  Yes, provided   Follow up plan: Follow up call scheduled for 03/19/22    Encounter Outcome:  Pt. Visit Completed

## 2022-03-19 NOTE — Patient Outreach (Signed)
  Care Coordination   Follow Up Visit Note   03/19/2022 Name: [name] MRN: [id number] DOB: March 18, 1946  [name] is a 76 y.o. year old female who sees Cathy Maltese, MD for primary care. I spoke with  [name] by phone today.  What matters to the patients health and wellness today?  In home care    Goals Addressed             This Visit's Progress    In home care needs       Care Coordination Interventions: Follow up phone call to patient to follow up on aid for personal care services Confirmed that [place] services are in place, services have been extended and will start on Monday Patient confirmed receiving a return call from  [facility], they will send another aid out to assist her with ADL's  on Monday          SDOH assessments and interventions completed:  No     Care Coordination Interventions Activated:  Yes  Care Coordination Interventions:  Yes, provided   Follow up plan: Follow up call scheduled for 03/30/22 11am    Encounter Outcome:  Pt. Visit Completed

## 2022-03-19 NOTE — Patient Instructions (Signed)
Visit Information  Thank you for taking time to visit with me today. Please don't hesitate to contact me if I can be of assistance to you.   Following are the goals we discussed today:   Goals Addressed             This Visit's Progress    In home care needs       Care Coordination Interventions: Follow up phone call to patient to follow up on aid for personal care services Confirmed that [place] services are in place, services have been extended and will start on Monday Patient confirmed receiving a return call from  [facility], they will send another aid out to assist her with ADL's  on Monday          Our next appointment is by telephone on 03/30/22 at 11am  Please call the care guide team at [phone] if you need to cancel or reschedule your appointment.   If you are experiencing a Mental Health or [place] or need someone to talk to, please call the Suicide and Crisis Lifeline: 988 call 911   Patient verbalizes understanding of instructions and care plan provided today and agrees to view in [place]. Active MyChart status and patient understanding of how to access instructions and care plan via MyChart confirmed with patient.     Telephone follow up appointment with care management team member scheduled for: 03/30/22  Elliot Gurney, [place] Worker  [facility] Care Management [phone]

## 2022-03-19 NOTE — Patient Instructions (Signed)
Visit Information  Thank you for taking time to visit with me today. Please don't hesitate to contact me if I can be of assistance to you.   Following are the goals we discussed today:   Goals Addressed             This Visit's Progress    In home care needs       Care Coordination Interventions: Patient confirmed plan that she did have surgery on shoulder 03/02/22 , PT services in place, services have been extended Patient satisfied with [facility], she is currently receiving 26 hours per month, however they did not show up this week-patient also concerned that the aid that had been coming lacks initiative  Patient confirms that her daughter has been  assisting, however she works and needs additional help with her ADL's Collaboration phone call to [facility] to discuss no show from [place] a message requesting a return call.  This social worker will follow up with patient once call is returned by [facility]        Our next appointment is by telephone on 03/19/22 at 4:00pm  Please call the care guide team at [phone] if you need to cancel or reschedule your appointment.   If you are experiencing a Mental Health or [place] or need someone to talk to, please call 911   Patient verbalizes understanding of instructions and care plan provided today and agrees to view in [place]. Active MyChart status and patient understanding of how to access instructions and care plan via MyChart confirmed with patient.     Telephone follow up appointment with care management team member scheduled for: 03/19/22  Elliot Gurney, [place] Worker  [facility] Care Management [phone]

## 2022-03-20 DIAGNOSIS — N184 Chronic kidney disease, stage 4 (severe): Secondary | ICD-10-CM | POA: Diagnosis not present

## 2022-03-20 DIAGNOSIS — I129 Hypertensive chronic kidney disease with stage 1 through stage 4 chronic kidney disease, or unspecified chronic kidney disease: Secondary | ICD-10-CM | POA: Diagnosis not present

## 2022-03-20 DIAGNOSIS — E1122 Type 2 diabetes mellitus with diabetic chronic kidney disease: Secondary | ICD-10-CM | POA: Diagnosis not present

## 2022-03-23 DIAGNOSIS — M109 Gout, unspecified: Secondary | ICD-10-CM | POA: Diagnosis not present

## 2022-03-23 DIAGNOSIS — N184 Chronic kidney disease, stage 4 (severe): Secondary | ICD-10-CM | POA: Diagnosis not present

## 2022-03-23 DIAGNOSIS — N2581 Secondary hyperparathyroidism of renal origin: Secondary | ICD-10-CM | POA: Diagnosis not present

## 2022-03-23 DIAGNOSIS — E785 Hyperlipidemia, unspecified: Secondary | ICD-10-CM | POA: Diagnosis not present

## 2022-03-23 DIAGNOSIS — Z471 Aftercare following joint replacement surgery: Secondary | ICD-10-CM | POA: Diagnosis not present

## 2022-03-23 DIAGNOSIS — M329 Systemic lupus erythematosus, unspecified: Secondary | ICD-10-CM | POA: Diagnosis not present

## 2022-03-23 DIAGNOSIS — G43909 Migraine, unspecified, not intractable, without status migrainosus: Secondary | ICD-10-CM | POA: Diagnosis not present

## 2022-03-23 DIAGNOSIS — I4510 Unspecified right bundle-branch block: Secondary | ICD-10-CM | POA: Diagnosis not present

## 2022-03-23 DIAGNOSIS — G4733 Obstructive sleep apnea (adult) (pediatric): Secondary | ICD-10-CM | POA: Diagnosis not present

## 2022-03-23 DIAGNOSIS — G4700 Insomnia, unspecified: Secondary | ICD-10-CM | POA: Diagnosis not present

## 2022-03-23 DIAGNOSIS — R011 Cardiac murmur, unspecified: Secondary | ICD-10-CM | POA: Diagnosis not present

## 2022-03-23 DIAGNOSIS — M4802 Spinal stenosis, cervical region: Secondary | ICD-10-CM | POA: Diagnosis not present

## 2022-03-23 DIAGNOSIS — K862 Cyst of pancreas: Secondary | ICD-10-CM | POA: Diagnosis not present

## 2022-03-23 DIAGNOSIS — K219 Gastro-esophageal reflux disease without esophagitis: Secondary | ICD-10-CM | POA: Diagnosis not present

## 2022-03-23 DIAGNOSIS — K8020 Calculus of gallbladder without cholecystitis without obstruction: Secondary | ICD-10-CM | POA: Diagnosis not present

## 2022-03-23 DIAGNOSIS — G5600 Carpal tunnel syndrome, unspecified upper limb: Secondary | ICD-10-CM | POA: Diagnosis not present

## 2022-03-23 DIAGNOSIS — I1310 Hypertensive heart and chronic kidney disease without heart failure, with stage 1 through stage 4 chronic kidney disease, or unspecified chronic kidney disease: Secondary | ICD-10-CM | POA: Diagnosis not present

## 2022-03-23 DIAGNOSIS — M25561 Pain in right knee: Secondary | ICD-10-CM | POA: Diagnosis not present

## 2022-03-23 DIAGNOSIS — M069 Rheumatoid arthritis, unspecified: Secondary | ICD-10-CM | POA: Diagnosis not present

## 2022-03-23 DIAGNOSIS — I2510 Atherosclerotic heart disease of native coronary artery without angina pectoris: Secondary | ICD-10-CM | POA: Diagnosis not present

## 2022-03-23 DIAGNOSIS — M5136 Other intervertebral disc degeneration, lumbar region: Secondary | ICD-10-CM | POA: Diagnosis not present

## 2022-03-23 DIAGNOSIS — D631 Anemia in chronic kidney disease: Secondary | ICD-10-CM | POA: Diagnosis not present

## 2022-03-23 DIAGNOSIS — M4312 Spondylolisthesis, cervical region: Secondary | ICD-10-CM | POA: Diagnosis not present

## 2022-03-23 DIAGNOSIS — E1122 Type 2 diabetes mellitus with diabetic chronic kidney disease: Secondary | ICD-10-CM | POA: Diagnosis not present

## 2022-03-24 DIAGNOSIS — M5136 Other intervertebral disc degeneration, lumbar region: Secondary | ICD-10-CM | POA: Diagnosis not present

## 2022-03-24 DIAGNOSIS — M329 Systemic lupus erythematosus, unspecified: Secondary | ICD-10-CM | POA: Diagnosis not present

## 2022-03-24 DIAGNOSIS — G5600 Carpal tunnel syndrome, unspecified upper limb: Secondary | ICD-10-CM | POA: Diagnosis not present

## 2022-03-24 DIAGNOSIS — I2510 Atherosclerotic heart disease of native coronary artery without angina pectoris: Secondary | ICD-10-CM | POA: Diagnosis not present

## 2022-03-24 DIAGNOSIS — Z471 Aftercare following joint replacement surgery: Secondary | ICD-10-CM | POA: Diagnosis not present

## 2022-03-24 DIAGNOSIS — G43909 Migraine, unspecified, not intractable, without status migrainosus: Secondary | ICD-10-CM | POA: Diagnosis not present

## 2022-03-24 DIAGNOSIS — G4733 Obstructive sleep apnea (adult) (pediatric): Secondary | ICD-10-CM | POA: Diagnosis not present

## 2022-03-24 DIAGNOSIS — K219 Gastro-esophageal reflux disease without esophagitis: Secondary | ICD-10-CM | POA: Diagnosis not present

## 2022-03-24 DIAGNOSIS — D631 Anemia in chronic kidney disease: Secondary | ICD-10-CM | POA: Diagnosis not present

## 2022-03-24 DIAGNOSIS — E785 Hyperlipidemia, unspecified: Secondary | ICD-10-CM | POA: Diagnosis not present

## 2022-03-24 DIAGNOSIS — E1122 Type 2 diabetes mellitus with diabetic chronic kidney disease: Secondary | ICD-10-CM | POA: Diagnosis not present

## 2022-03-24 DIAGNOSIS — M25561 Pain in right knee: Secondary | ICD-10-CM | POA: Diagnosis not present

## 2022-03-24 DIAGNOSIS — K8020 Calculus of gallbladder without cholecystitis without obstruction: Secondary | ICD-10-CM | POA: Diagnosis not present

## 2022-03-24 DIAGNOSIS — N184 Chronic kidney disease, stage 4 (severe): Secondary | ICD-10-CM | POA: Diagnosis not present

## 2022-03-24 DIAGNOSIS — M109 Gout, unspecified: Secondary | ICD-10-CM | POA: Diagnosis not present

## 2022-03-24 DIAGNOSIS — I4510 Unspecified right bundle-branch block: Secondary | ICD-10-CM | POA: Diagnosis not present

## 2022-03-24 DIAGNOSIS — M069 Rheumatoid arthritis, unspecified: Secondary | ICD-10-CM | POA: Diagnosis not present

## 2022-03-24 DIAGNOSIS — G4700 Insomnia, unspecified: Secondary | ICD-10-CM | POA: Diagnosis not present

## 2022-03-24 DIAGNOSIS — R011 Cardiac murmur, unspecified: Secondary | ICD-10-CM | POA: Diagnosis not present

## 2022-03-24 DIAGNOSIS — M4312 Spondylolisthesis, cervical region: Secondary | ICD-10-CM | POA: Diagnosis not present

## 2022-03-24 DIAGNOSIS — N2581 Secondary hyperparathyroidism of renal origin: Secondary | ICD-10-CM | POA: Diagnosis not present

## 2022-03-24 DIAGNOSIS — I1310 Hypertensive heart and chronic kidney disease without heart failure, with stage 1 through stage 4 chronic kidney disease, or unspecified chronic kidney disease: Secondary | ICD-10-CM | POA: Diagnosis not present

## 2022-03-24 DIAGNOSIS — K862 Cyst of pancreas: Secondary | ICD-10-CM | POA: Diagnosis not present

## 2022-03-24 DIAGNOSIS — M4802 Spinal stenosis, cervical region: Secondary | ICD-10-CM | POA: Diagnosis not present

## 2022-03-25 DIAGNOSIS — M109 Gout, unspecified: Secondary | ICD-10-CM | POA: Diagnosis not present

## 2022-03-25 DIAGNOSIS — E1122 Type 2 diabetes mellitus with diabetic chronic kidney disease: Secondary | ICD-10-CM | POA: Diagnosis not present

## 2022-03-25 DIAGNOSIS — M5136 Other intervertebral disc degeneration, lumbar region: Secondary | ICD-10-CM | POA: Diagnosis not present

## 2022-03-25 DIAGNOSIS — D631 Anemia in chronic kidney disease: Secondary | ICD-10-CM | POA: Diagnosis not present

## 2022-03-25 DIAGNOSIS — K8020 Calculus of gallbladder without cholecystitis without obstruction: Secondary | ICD-10-CM | POA: Diagnosis not present

## 2022-03-25 DIAGNOSIS — I1310 Hypertensive heart and chronic kidney disease without heart failure, with stage 1 through stage 4 chronic kidney disease, or unspecified chronic kidney disease: Secondary | ICD-10-CM | POA: Diagnosis not present

## 2022-03-25 DIAGNOSIS — N184 Chronic kidney disease, stage 4 (severe): Secondary | ICD-10-CM | POA: Diagnosis not present

## 2022-03-25 DIAGNOSIS — G5600 Carpal tunnel syndrome, unspecified upper limb: Secondary | ICD-10-CM | POA: Diagnosis not present

## 2022-03-25 DIAGNOSIS — K219 Gastro-esophageal reflux disease without esophagitis: Secondary | ICD-10-CM | POA: Diagnosis not present

## 2022-03-25 DIAGNOSIS — M329 Systemic lupus erythematosus, unspecified: Secondary | ICD-10-CM | POA: Diagnosis not present

## 2022-03-25 DIAGNOSIS — M25561 Pain in right knee: Secondary | ICD-10-CM | POA: Diagnosis not present

## 2022-03-25 DIAGNOSIS — G4700 Insomnia, unspecified: Secondary | ICD-10-CM | POA: Diagnosis not present

## 2022-03-25 DIAGNOSIS — Z471 Aftercare following joint replacement surgery: Secondary | ICD-10-CM | POA: Diagnosis not present

## 2022-03-25 DIAGNOSIS — I4510 Unspecified right bundle-branch block: Secondary | ICD-10-CM | POA: Diagnosis not present

## 2022-03-25 DIAGNOSIS — I2510 Atherosclerotic heart disease of native coronary artery without angina pectoris: Secondary | ICD-10-CM | POA: Diagnosis not present

## 2022-03-25 DIAGNOSIS — K862 Cyst of pancreas: Secondary | ICD-10-CM | POA: Diagnosis not present

## 2022-03-25 DIAGNOSIS — G43909 Migraine, unspecified, not intractable, without status migrainosus: Secondary | ICD-10-CM | POA: Diagnosis not present

## 2022-03-25 DIAGNOSIS — G4733 Obstructive sleep apnea (adult) (pediatric): Secondary | ICD-10-CM | POA: Diagnosis not present

## 2022-03-25 DIAGNOSIS — E785 Hyperlipidemia, unspecified: Secondary | ICD-10-CM | POA: Diagnosis not present

## 2022-03-25 DIAGNOSIS — N2581 Secondary hyperparathyroidism of renal origin: Secondary | ICD-10-CM | POA: Diagnosis not present

## 2022-03-25 DIAGNOSIS — M069 Rheumatoid arthritis, unspecified: Secondary | ICD-10-CM | POA: Diagnosis not present

## 2022-03-25 DIAGNOSIS — M4312 Spondylolisthesis, cervical region: Secondary | ICD-10-CM | POA: Diagnosis not present

## 2022-03-25 DIAGNOSIS — M4802 Spinal stenosis, cervical region: Secondary | ICD-10-CM | POA: Diagnosis not present

## 2022-03-25 DIAGNOSIS — R011 Cardiac murmur, unspecified: Secondary | ICD-10-CM | POA: Diagnosis not present

## 2022-03-26 DIAGNOSIS — G4700 Insomnia, unspecified: Secondary | ICD-10-CM | POA: Diagnosis not present

## 2022-03-26 DIAGNOSIS — I4510 Unspecified right bundle-branch block: Secondary | ICD-10-CM | POA: Diagnosis not present

## 2022-03-26 DIAGNOSIS — M5136 Other intervertebral disc degeneration, lumbar region: Secondary | ICD-10-CM | POA: Diagnosis not present

## 2022-03-26 DIAGNOSIS — M329 Systemic lupus erythematosus, unspecified: Secondary | ICD-10-CM | POA: Diagnosis not present

## 2022-03-26 DIAGNOSIS — G4733 Obstructive sleep apnea (adult) (pediatric): Secondary | ICD-10-CM | POA: Diagnosis not present

## 2022-03-26 DIAGNOSIS — Z471 Aftercare following joint replacement surgery: Secondary | ICD-10-CM | POA: Diagnosis not present

## 2022-03-26 DIAGNOSIS — E785 Hyperlipidemia, unspecified: Secondary | ICD-10-CM | POA: Diagnosis not present

## 2022-03-26 DIAGNOSIS — E1122 Type 2 diabetes mellitus with diabetic chronic kidney disease: Secondary | ICD-10-CM | POA: Diagnosis not present

## 2022-03-26 DIAGNOSIS — N2581 Secondary hyperparathyroidism of renal origin: Secondary | ICD-10-CM | POA: Diagnosis not present

## 2022-03-26 DIAGNOSIS — M069 Rheumatoid arthritis, unspecified: Secondary | ICD-10-CM | POA: Diagnosis not present

## 2022-03-26 DIAGNOSIS — R011 Cardiac murmur, unspecified: Secondary | ICD-10-CM | POA: Diagnosis not present

## 2022-03-26 DIAGNOSIS — K862 Cyst of pancreas: Secondary | ICD-10-CM | POA: Diagnosis not present

## 2022-03-26 DIAGNOSIS — M4312 Spondylolisthesis, cervical region: Secondary | ICD-10-CM | POA: Diagnosis not present

## 2022-03-26 DIAGNOSIS — M25561 Pain in right knee: Secondary | ICD-10-CM | POA: Diagnosis not present

## 2022-03-26 DIAGNOSIS — G5600 Carpal tunnel syndrome, unspecified upper limb: Secondary | ICD-10-CM | POA: Diagnosis not present

## 2022-03-26 DIAGNOSIS — M109 Gout, unspecified: Secondary | ICD-10-CM | POA: Diagnosis not present

## 2022-03-26 DIAGNOSIS — K8020 Calculus of gallbladder without cholecystitis without obstruction: Secondary | ICD-10-CM | POA: Diagnosis not present

## 2022-03-26 DIAGNOSIS — M4802 Spinal stenosis, cervical region: Secondary | ICD-10-CM | POA: Diagnosis not present

## 2022-03-26 DIAGNOSIS — N184 Chronic kidney disease, stage 4 (severe): Secondary | ICD-10-CM | POA: Diagnosis not present

## 2022-03-26 DIAGNOSIS — G43909 Migraine, unspecified, not intractable, without status migrainosus: Secondary | ICD-10-CM | POA: Diagnosis not present

## 2022-03-26 DIAGNOSIS — I2510 Atherosclerotic heart disease of native coronary artery without angina pectoris: Secondary | ICD-10-CM | POA: Diagnosis not present

## 2022-03-26 DIAGNOSIS — K219 Gastro-esophageal reflux disease without esophagitis: Secondary | ICD-10-CM | POA: Diagnosis not present

## 2022-03-26 DIAGNOSIS — I1310 Hypertensive heart and chronic kidney disease without heart failure, with stage 1 through stage 4 chronic kidney disease, or unspecified chronic kidney disease: Secondary | ICD-10-CM | POA: Diagnosis not present

## 2022-03-26 DIAGNOSIS — D631 Anemia in chronic kidney disease: Secondary | ICD-10-CM | POA: Diagnosis not present

## 2022-03-29 DIAGNOSIS — M069 Rheumatoid arthritis, unspecified: Secondary | ICD-10-CM | POA: Diagnosis not present

## 2022-03-29 DIAGNOSIS — G43909 Migraine, unspecified, not intractable, without status migrainosus: Secondary | ICD-10-CM | POA: Diagnosis not present

## 2022-03-29 DIAGNOSIS — M4312 Spondylolisthesis, cervical region: Secondary | ICD-10-CM | POA: Diagnosis not present

## 2022-03-29 DIAGNOSIS — M4802 Spinal stenosis, cervical region: Secondary | ICD-10-CM | POA: Diagnosis not present

## 2022-03-29 DIAGNOSIS — I4510 Unspecified right bundle-branch block: Secondary | ICD-10-CM | POA: Diagnosis not present

## 2022-03-29 DIAGNOSIS — R011 Cardiac murmur, unspecified: Secondary | ICD-10-CM | POA: Diagnosis not present

## 2022-03-29 DIAGNOSIS — I10 Essential (primary) hypertension: Secondary | ICD-10-CM | POA: Diagnosis not present

## 2022-03-29 DIAGNOSIS — G4700 Insomnia, unspecified: Secondary | ICD-10-CM | POA: Diagnosis not present

## 2022-03-29 DIAGNOSIS — G5600 Carpal tunnel syndrome, unspecified upper limb: Secondary | ICD-10-CM | POA: Diagnosis not present

## 2022-03-29 DIAGNOSIS — M25561 Pain in right knee: Secondary | ICD-10-CM | POA: Diagnosis not present

## 2022-03-29 DIAGNOSIS — M5136 Other intervertebral disc degeneration, lumbar region: Secondary | ICD-10-CM | POA: Diagnosis not present

## 2022-03-29 DIAGNOSIS — E782 Mixed hyperlipidemia: Secondary | ICD-10-CM | POA: Diagnosis not present

## 2022-03-29 DIAGNOSIS — E785 Hyperlipidemia, unspecified: Secondary | ICD-10-CM | POA: Diagnosis not present

## 2022-03-29 DIAGNOSIS — K8020 Calculus of gallbladder without cholecystitis without obstruction: Secondary | ICD-10-CM | POA: Diagnosis not present

## 2022-03-29 DIAGNOSIS — Z0000 Encounter for general adult medical examination without abnormal findings: Secondary | ICD-10-CM | POA: Diagnosis not present

## 2022-03-29 DIAGNOSIS — M329 Systemic lupus erythematosus, unspecified: Secondary | ICD-10-CM | POA: Diagnosis not present

## 2022-03-29 DIAGNOSIS — K219 Gastro-esophageal reflux disease without esophagitis: Secondary | ICD-10-CM | POA: Diagnosis not present

## 2022-03-29 DIAGNOSIS — E1122 Type 2 diabetes mellitus with diabetic chronic kidney disease: Secondary | ICD-10-CM | POA: Diagnosis not present

## 2022-03-29 DIAGNOSIS — D631 Anemia in chronic kidney disease: Secondary | ICD-10-CM | POA: Diagnosis not present

## 2022-03-29 DIAGNOSIS — I2510 Atherosclerotic heart disease of native coronary artery without angina pectoris: Secondary | ICD-10-CM | POA: Diagnosis not present

## 2022-03-29 DIAGNOSIS — N2581 Secondary hyperparathyroidism of renal origin: Secondary | ICD-10-CM | POA: Diagnosis not present

## 2022-03-29 DIAGNOSIS — K862 Cyst of pancreas: Secondary | ICD-10-CM | POA: Diagnosis not present

## 2022-03-29 DIAGNOSIS — Z471 Aftercare following joint replacement surgery: Secondary | ICD-10-CM | POA: Diagnosis not present

## 2022-03-29 DIAGNOSIS — N184 Chronic kidney disease, stage 4 (severe): Secondary | ICD-10-CM | POA: Diagnosis not present

## 2022-03-29 DIAGNOSIS — I1310 Hypertensive heart and chronic kidney disease without heart failure, with stage 1 through stage 4 chronic kidney disease, or unspecified chronic kidney disease: Secondary | ICD-10-CM | POA: Diagnosis not present

## 2022-03-29 DIAGNOSIS — M109 Gout, unspecified: Secondary | ICD-10-CM | POA: Diagnosis not present

## 2022-03-29 DIAGNOSIS — G4733 Obstructive sleep apnea (adult) (pediatric): Secondary | ICD-10-CM | POA: Diagnosis not present

## 2022-03-30 ENCOUNTER — Ambulatory Visit: Payer: Self-pay | Admitting: *Deleted

## 2022-03-30 NOTE — Patient Instructions (Signed)
Visit Information  Thank you for taking time to visit with me today. Please don't hesitate to contact me if I can be of assistance to you.   Following are the goals we discussed today:   Goals Addressed             This Visit's Progress    In home care needs       Care Coordination Interventions: Follow up phone call to patient to follow up on aid for personal care services Confirmed that [place] services are in place, PT comes 2x per week Patient confirmed receiving a return call from  [facility], they were supposed to send another aid out to assist her with ADL's  on Monday, however no one came Patient would like to change agencies at this point-permission provided to contact [facility] to discuss concerns Phone call to [facility]-discussed concerns regarding no show-they will contact patient Phone call to [facility] [phone] to discuss change of personal care services provider-VM left for a return call          Our next appointment is by telephone on 04/13/22 at 11am  Please call the care guide team at [phone] if you need to cancel or reschedule your appointment.   If you are experiencing a Mental Health or [place] or need someone to talk to, please call 911   Patient verbalizes understanding of instructions and care plan provided today and agrees to view in [place]. Active MyChart status and patient understanding of how to access instructions and care plan via MyChart confirmed with patient.     Telephone follow up appointment with care management team member scheduled for: 04/13/22  Elliot Gurney, [place] Worker  [facility] Care Management [phone]

## 2022-03-30 NOTE — Patient Outreach (Signed)
  Care Coordination   Follow Up Visit Note   03/30/2022 Name: [name] MRN: [id number] DOB: 1946/06/02  [name] is a 76 y.o. year old female who sees Perrin Maltese, MD for primary care. I spoke with  [name] by phone today.  What matters to the patients health and wellness today?  In home care assistance    Goals Addressed             This Visit's Progress    In home care needs       Care Coordination Interventions: Follow up phone call to patient to follow up on aid for personal care services Confirmed that [place] services are in place, PT comes 2x per week Patient confirmed receiving a return call from  [facility], they were supposed to send another aid out to assist her with ADL's  on Monday, however no one came Patient would like to change agencies at this point-permission provided to contact [facility] to discuss concerns Phone call to [facility]-discussed concerns regarding no show-they will contact patient Phone call to [facility] [phone] to discuss change of personal care services provider-VM left for a return call          SDOH assessments and interventions completed:  No     Care Coordination Interventions Activated:  Yes  Care Coordination Interventions:  Yes, provided   Follow up plan: Follow up call scheduled for 04/13/22    Encounter Outcome:  Pt. Visit Completed

## 2022-04-01 DIAGNOSIS — I1310 Hypertensive heart and chronic kidney disease without heart failure, with stage 1 through stage 4 chronic kidney disease, or unspecified chronic kidney disease: Secondary | ICD-10-CM | POA: Diagnosis not present

## 2022-04-01 DIAGNOSIS — K8020 Calculus of gallbladder without cholecystitis without obstruction: Secondary | ICD-10-CM | POA: Diagnosis not present

## 2022-04-01 DIAGNOSIS — G4700 Insomnia, unspecified: Secondary | ICD-10-CM | POA: Diagnosis not present

## 2022-04-01 DIAGNOSIS — M329 Systemic lupus erythematosus, unspecified: Secondary | ICD-10-CM | POA: Diagnosis not present

## 2022-04-01 DIAGNOSIS — Z471 Aftercare following joint replacement surgery: Secondary | ICD-10-CM | POA: Diagnosis not present

## 2022-04-01 DIAGNOSIS — M109 Gout, unspecified: Secondary | ICD-10-CM | POA: Diagnosis not present

## 2022-04-01 DIAGNOSIS — D631 Anemia in chronic kidney disease: Secondary | ICD-10-CM | POA: Diagnosis not present

## 2022-04-01 DIAGNOSIS — M4312 Spondylolisthesis, cervical region: Secondary | ICD-10-CM | POA: Diagnosis not present

## 2022-04-01 DIAGNOSIS — M069 Rheumatoid arthritis, unspecified: Secondary | ICD-10-CM | POA: Diagnosis not present

## 2022-04-01 DIAGNOSIS — I4510 Unspecified right bundle-branch block: Secondary | ICD-10-CM | POA: Diagnosis not present

## 2022-04-01 DIAGNOSIS — G43909 Migraine, unspecified, not intractable, without status migrainosus: Secondary | ICD-10-CM | POA: Diagnosis not present

## 2022-04-01 DIAGNOSIS — M4802 Spinal stenosis, cervical region: Secondary | ICD-10-CM | POA: Diagnosis not present

## 2022-04-01 DIAGNOSIS — G4733 Obstructive sleep apnea (adult) (pediatric): Secondary | ICD-10-CM | POA: Diagnosis not present

## 2022-04-01 DIAGNOSIS — G5600 Carpal tunnel syndrome, unspecified upper limb: Secondary | ICD-10-CM | POA: Diagnosis not present

## 2022-04-01 DIAGNOSIS — M25561 Pain in right knee: Secondary | ICD-10-CM | POA: Diagnosis not present

## 2022-04-01 DIAGNOSIS — E1122 Type 2 diabetes mellitus with diabetic chronic kidney disease: Secondary | ICD-10-CM | POA: Diagnosis not present

## 2022-04-01 DIAGNOSIS — R011 Cardiac murmur, unspecified: Secondary | ICD-10-CM | POA: Diagnosis not present

## 2022-04-01 DIAGNOSIS — N2581 Secondary hyperparathyroidism of renal origin: Secondary | ICD-10-CM | POA: Diagnosis not present

## 2022-04-01 DIAGNOSIS — I2510 Atherosclerotic heart disease of native coronary artery without angina pectoris: Secondary | ICD-10-CM | POA: Diagnosis not present

## 2022-04-01 DIAGNOSIS — E785 Hyperlipidemia, unspecified: Secondary | ICD-10-CM | POA: Diagnosis not present

## 2022-04-01 DIAGNOSIS — K862 Cyst of pancreas: Secondary | ICD-10-CM | POA: Diagnosis not present

## 2022-04-01 DIAGNOSIS — N184 Chronic kidney disease, stage 4 (severe): Secondary | ICD-10-CM | POA: Diagnosis not present

## 2022-04-01 DIAGNOSIS — M5136 Other intervertebral disc degeneration, lumbar region: Secondary | ICD-10-CM | POA: Diagnosis not present

## 2022-04-01 DIAGNOSIS — K219 Gastro-esophageal reflux disease without esophagitis: Secondary | ICD-10-CM | POA: Diagnosis not present

## 2022-04-05 DIAGNOSIS — I2510 Atherosclerotic heart disease of native coronary artery without angina pectoris: Secondary | ICD-10-CM | POA: Diagnosis not present

## 2022-04-05 DIAGNOSIS — N184 Chronic kidney disease, stage 4 (severe): Secondary | ICD-10-CM | POA: Diagnosis not present

## 2022-04-05 DIAGNOSIS — D631 Anemia in chronic kidney disease: Secondary | ICD-10-CM | POA: Diagnosis not present

## 2022-04-05 DIAGNOSIS — M25561 Pain in right knee: Secondary | ICD-10-CM | POA: Diagnosis not present

## 2022-04-05 DIAGNOSIS — I4510 Unspecified right bundle-branch block: Secondary | ICD-10-CM | POA: Diagnosis not present

## 2022-04-05 DIAGNOSIS — G4733 Obstructive sleep apnea (adult) (pediatric): Secondary | ICD-10-CM | POA: Diagnosis not present

## 2022-04-05 DIAGNOSIS — G5600 Carpal tunnel syndrome, unspecified upper limb: Secondary | ICD-10-CM | POA: Diagnosis not present

## 2022-04-05 DIAGNOSIS — M4312 Spondylolisthesis, cervical region: Secondary | ICD-10-CM | POA: Diagnosis not present

## 2022-04-05 DIAGNOSIS — N2581 Secondary hyperparathyroidism of renal origin: Secondary | ICD-10-CM | POA: Diagnosis not present

## 2022-04-05 DIAGNOSIS — R011 Cardiac murmur, unspecified: Secondary | ICD-10-CM | POA: Diagnosis not present

## 2022-04-05 DIAGNOSIS — I1310 Hypertensive heart and chronic kidney disease without heart failure, with stage 1 through stage 4 chronic kidney disease, or unspecified chronic kidney disease: Secondary | ICD-10-CM | POA: Diagnosis not present

## 2022-04-05 DIAGNOSIS — M109 Gout, unspecified: Secondary | ICD-10-CM | POA: Diagnosis not present

## 2022-04-05 DIAGNOSIS — M069 Rheumatoid arthritis, unspecified: Secondary | ICD-10-CM | POA: Diagnosis not present

## 2022-04-05 DIAGNOSIS — K8020 Calculus of gallbladder without cholecystitis without obstruction: Secondary | ICD-10-CM | POA: Diagnosis not present

## 2022-04-05 DIAGNOSIS — K219 Gastro-esophageal reflux disease without esophagitis: Secondary | ICD-10-CM | POA: Diagnosis not present

## 2022-04-05 DIAGNOSIS — M4802 Spinal stenosis, cervical region: Secondary | ICD-10-CM | POA: Diagnosis not present

## 2022-04-05 DIAGNOSIS — G43909 Migraine, unspecified, not intractable, without status migrainosus: Secondary | ICD-10-CM | POA: Diagnosis not present

## 2022-04-05 DIAGNOSIS — E785 Hyperlipidemia, unspecified: Secondary | ICD-10-CM | POA: Diagnosis not present

## 2022-04-05 DIAGNOSIS — K862 Cyst of pancreas: Secondary | ICD-10-CM | POA: Diagnosis not present

## 2022-04-05 DIAGNOSIS — E1122 Type 2 diabetes mellitus with diabetic chronic kidney disease: Secondary | ICD-10-CM | POA: Diagnosis not present

## 2022-04-05 DIAGNOSIS — G4700 Insomnia, unspecified: Secondary | ICD-10-CM | POA: Diagnosis not present

## 2022-04-05 DIAGNOSIS — M5136 Other intervertebral disc degeneration, lumbar region: Secondary | ICD-10-CM | POA: Diagnosis not present

## 2022-04-05 DIAGNOSIS — M329 Systemic lupus erythematosus, unspecified: Secondary | ICD-10-CM | POA: Diagnosis not present

## 2022-04-05 DIAGNOSIS — Z471 Aftercare following joint replacement surgery: Secondary | ICD-10-CM | POA: Diagnosis not present

## 2022-04-07 DIAGNOSIS — M109 Gout, unspecified: Secondary | ICD-10-CM | POA: Diagnosis not present

## 2022-04-07 DIAGNOSIS — K219 Gastro-esophageal reflux disease without esophagitis: Secondary | ICD-10-CM | POA: Diagnosis not present

## 2022-04-07 DIAGNOSIS — M4312 Spondylolisthesis, cervical region: Secondary | ICD-10-CM | POA: Diagnosis not present

## 2022-04-07 DIAGNOSIS — G5600 Carpal tunnel syndrome, unspecified upper limb: Secondary | ICD-10-CM | POA: Diagnosis not present

## 2022-04-07 DIAGNOSIS — M4802 Spinal stenosis, cervical region: Secondary | ICD-10-CM | POA: Diagnosis not present

## 2022-04-07 DIAGNOSIS — I1310 Hypertensive heart and chronic kidney disease without heart failure, with stage 1 through stage 4 chronic kidney disease, or unspecified chronic kidney disease: Secondary | ICD-10-CM | POA: Diagnosis not present

## 2022-04-07 DIAGNOSIS — E785 Hyperlipidemia, unspecified: Secondary | ICD-10-CM | POA: Diagnosis not present

## 2022-04-07 DIAGNOSIS — I4510 Unspecified right bundle-branch block: Secondary | ICD-10-CM | POA: Diagnosis not present

## 2022-04-07 DIAGNOSIS — Z471 Aftercare following joint replacement surgery: Secondary | ICD-10-CM | POA: Diagnosis not present

## 2022-04-07 DIAGNOSIS — M069 Rheumatoid arthritis, unspecified: Secondary | ICD-10-CM | POA: Diagnosis not present

## 2022-04-07 DIAGNOSIS — D631 Anemia in chronic kidney disease: Secondary | ICD-10-CM | POA: Diagnosis not present

## 2022-04-07 DIAGNOSIS — M5136 Other intervertebral disc degeneration, lumbar region: Secondary | ICD-10-CM | POA: Diagnosis not present

## 2022-04-07 DIAGNOSIS — E1122 Type 2 diabetes mellitus with diabetic chronic kidney disease: Secondary | ICD-10-CM | POA: Diagnosis not present

## 2022-04-07 DIAGNOSIS — G43909 Migraine, unspecified, not intractable, without status migrainosus: Secondary | ICD-10-CM | POA: Diagnosis not present

## 2022-04-07 DIAGNOSIS — R011 Cardiac murmur, unspecified: Secondary | ICD-10-CM | POA: Diagnosis not present

## 2022-04-07 DIAGNOSIS — M329 Systemic lupus erythematosus, unspecified: Secondary | ICD-10-CM | POA: Diagnosis not present

## 2022-04-07 DIAGNOSIS — I2510 Atherosclerotic heart disease of native coronary artery without angina pectoris: Secondary | ICD-10-CM | POA: Diagnosis not present

## 2022-04-07 DIAGNOSIS — N184 Chronic kidney disease, stage 4 (severe): Secondary | ICD-10-CM | POA: Diagnosis not present

## 2022-04-07 DIAGNOSIS — G4733 Obstructive sleep apnea (adult) (pediatric): Secondary | ICD-10-CM | POA: Diagnosis not present

## 2022-04-07 DIAGNOSIS — N2581 Secondary hyperparathyroidism of renal origin: Secondary | ICD-10-CM | POA: Diagnosis not present

## 2022-04-07 DIAGNOSIS — K862 Cyst of pancreas: Secondary | ICD-10-CM | POA: Diagnosis not present

## 2022-04-07 DIAGNOSIS — M25561 Pain in right knee: Secondary | ICD-10-CM | POA: Diagnosis not present

## 2022-04-07 DIAGNOSIS — G4700 Insomnia, unspecified: Secondary | ICD-10-CM | POA: Diagnosis not present

## 2022-04-07 DIAGNOSIS — K8020 Calculus of gallbladder without cholecystitis without obstruction: Secondary | ICD-10-CM | POA: Diagnosis not present

## 2022-04-12 DIAGNOSIS — M069 Rheumatoid arthritis, unspecified: Secondary | ICD-10-CM | POA: Diagnosis not present

## 2022-04-12 DIAGNOSIS — K8020 Calculus of gallbladder without cholecystitis without obstruction: Secondary | ICD-10-CM | POA: Diagnosis not present

## 2022-04-12 DIAGNOSIS — M4312 Spondylolisthesis, cervical region: Secondary | ICD-10-CM | POA: Diagnosis not present

## 2022-04-12 DIAGNOSIS — M7581 Other shoulder lesions, right shoulder: Secondary | ICD-10-CM | POA: Diagnosis not present

## 2022-04-12 DIAGNOSIS — D631 Anemia in chronic kidney disease: Secondary | ICD-10-CM | POA: Diagnosis not present

## 2022-04-12 DIAGNOSIS — K219 Gastro-esophageal reflux disease without esophagitis: Secondary | ICD-10-CM | POA: Diagnosis not present

## 2022-04-12 DIAGNOSIS — M5136 Other intervertebral disc degeneration, lumbar region: Secondary | ICD-10-CM | POA: Diagnosis not present

## 2022-04-12 DIAGNOSIS — G5600 Carpal tunnel syndrome, unspecified upper limb: Secondary | ICD-10-CM | POA: Diagnosis not present

## 2022-04-12 DIAGNOSIS — G43909 Migraine, unspecified, not intractable, without status migrainosus: Secondary | ICD-10-CM | POA: Diagnosis not present

## 2022-04-12 DIAGNOSIS — M25561 Pain in right knee: Secondary | ICD-10-CM | POA: Diagnosis not present

## 2022-04-12 DIAGNOSIS — N2581 Secondary hyperparathyroidism of renal origin: Secondary | ICD-10-CM | POA: Diagnosis not present

## 2022-04-12 DIAGNOSIS — E1122 Type 2 diabetes mellitus with diabetic chronic kidney disease: Secondary | ICD-10-CM | POA: Diagnosis not present

## 2022-04-12 DIAGNOSIS — K862 Cyst of pancreas: Secondary | ICD-10-CM | POA: Diagnosis not present

## 2022-04-12 DIAGNOSIS — M109 Gout, unspecified: Secondary | ICD-10-CM | POA: Diagnosis not present

## 2022-04-12 DIAGNOSIS — Z471 Aftercare following joint replacement surgery: Secondary | ICD-10-CM | POA: Diagnosis not present

## 2022-04-12 DIAGNOSIS — G4733 Obstructive sleep apnea (adult) (pediatric): Secondary | ICD-10-CM | POA: Diagnosis not present

## 2022-04-12 DIAGNOSIS — G4700 Insomnia, unspecified: Secondary | ICD-10-CM | POA: Diagnosis not present

## 2022-04-12 DIAGNOSIS — M4802 Spinal stenosis, cervical region: Secondary | ICD-10-CM | POA: Diagnosis not present

## 2022-04-12 DIAGNOSIS — M329 Systemic lupus erythematosus, unspecified: Secondary | ICD-10-CM | POA: Diagnosis not present

## 2022-04-12 DIAGNOSIS — I4510 Unspecified right bundle-branch block: Secondary | ICD-10-CM | POA: Diagnosis not present

## 2022-04-12 DIAGNOSIS — R011 Cardiac murmur, unspecified: Secondary | ICD-10-CM | POA: Diagnosis not present

## 2022-04-12 DIAGNOSIS — I2510 Atherosclerotic heart disease of native coronary artery without angina pectoris: Secondary | ICD-10-CM | POA: Diagnosis not present

## 2022-04-12 DIAGNOSIS — E785 Hyperlipidemia, unspecified: Secondary | ICD-10-CM | POA: Diagnosis not present

## 2022-04-12 DIAGNOSIS — Z96611 Presence of right artificial shoulder joint: Secondary | ICD-10-CM | POA: Diagnosis not present

## 2022-04-12 DIAGNOSIS — M19111 Post-traumatic osteoarthritis, right shoulder: Secondary | ICD-10-CM | POA: Diagnosis not present

## 2022-04-12 DIAGNOSIS — I1310 Hypertensive heart and chronic kidney disease without heart failure, with stage 1 through stage 4 chronic kidney disease, or unspecified chronic kidney disease: Secondary | ICD-10-CM | POA: Diagnosis not present

## 2022-04-12 DIAGNOSIS — N184 Chronic kidney disease, stage 4 (severe): Secondary | ICD-10-CM | POA: Diagnosis not present

## 2022-04-13 ENCOUNTER — Ambulatory Visit: Payer: Self-pay | Admitting: *Deleted

## 2022-04-13 NOTE — Patient Outreach (Signed)
  Care Coordination   Follow Up Visit Note   04/13/2022 Name: [name] MRN: [id number] DOB: [birth year]  [name] is a 76 y.o. year old female who sees Cathy Maltese, MD for primary care. I spoke with  [name] by phone today.  What matters to the patients health and wellness today?  Change of provider for personal care services    Goals Addressed             This Visit's Progress    In home care needs       Care Coordination Interventions: Follow up phone call to patient to follow up on aid for personal care services Confirmed that [name] services remain in place, PT comes 2x per week Patient confirmed receiving a return call from  [name], they remain inconsistent with their care and patient is requesting another agency Patient would like to change agencies at this point-[name] contacted [phone] however they require that patient call to request the change Phone number provided to [name] patient agreeable to call today to request change and to report their inconsistencies with care This social worker to follow up with patient regarding personal care services and change of provider           SDOH assessments and interventions completed:  No     Care Coordination Interventions Activated:  Yes  Care Coordination Interventions:  Yes, provided   Follow up plan: Follow up call scheduled for 04/21/22    Encounter Outcome:  Pt. Visit Completed

## 2022-04-13 NOTE — Patient Instructions (Signed)
Visit Information  Thank you for taking time to visit with me today. Please don't hesitate to contact me if I can be of assistance to you.   Following are the goals we discussed today:   Goals Addressed             This Visit's Progress    In home care needs       Care Coordination Interventions: Follow up phone call to patient to follow up on aid for personal care services Confirmed that [place] services remain in place, PT comes 2x per week Patient confirmed receiving a return call from  [facility], they remain inconsistent with their care and patient is requesting another agency Patient would like to change agencies at this point-[facility] contacted [phone] however they require that patient call to request the change Phone number provided to [place] patient agreeable to call today to request change and to report their inconsistencies with care This social worker to follow up with patient regarding personal care services and change of provider           Our next appointment is by telephone on 04/21/22 at 10:30am  Please call the care guide team at [phone] if you need to cancel or reschedule your appointment.   If you are experiencing a Mental Health or [place] or need someone to talk to, please call 911   Patient verbalizes understanding of instructions and care plan provided today and agrees to view in [place]. Active MyChart status and patient understanding of how to access instructions and care plan via MyChart confirmed with patient.     Telephone follow up appointment with care management team member scheduled for: 04/21/22  Elliot Gurney, [place] Worker  [facility] Care Management [phone]

## 2022-04-15 DIAGNOSIS — M069 Rheumatoid arthritis, unspecified: Secondary | ICD-10-CM | POA: Diagnosis not present

## 2022-04-15 DIAGNOSIS — M25561 Pain in right knee: Secondary | ICD-10-CM | POA: Diagnosis not present

## 2022-04-15 DIAGNOSIS — D631 Anemia in chronic kidney disease: Secondary | ICD-10-CM | POA: Diagnosis not present

## 2022-04-15 DIAGNOSIS — N184 Chronic kidney disease, stage 4 (severe): Secondary | ICD-10-CM | POA: Diagnosis not present

## 2022-04-15 DIAGNOSIS — G4700 Insomnia, unspecified: Secondary | ICD-10-CM | POA: Diagnosis not present

## 2022-04-15 DIAGNOSIS — K219 Gastro-esophageal reflux disease without esophagitis: Secondary | ICD-10-CM | POA: Diagnosis not present

## 2022-04-15 DIAGNOSIS — M329 Systemic lupus erythematosus, unspecified: Secondary | ICD-10-CM | POA: Diagnosis not present

## 2022-04-15 DIAGNOSIS — G4733 Obstructive sleep apnea (adult) (pediatric): Secondary | ICD-10-CM | POA: Diagnosis not present

## 2022-04-15 DIAGNOSIS — M109 Gout, unspecified: Secondary | ICD-10-CM | POA: Diagnosis not present

## 2022-04-15 DIAGNOSIS — K862 Cyst of pancreas: Secondary | ICD-10-CM | POA: Diagnosis not present

## 2022-04-15 DIAGNOSIS — M5136 Other intervertebral disc degeneration, lumbar region: Secondary | ICD-10-CM | POA: Diagnosis not present

## 2022-04-15 DIAGNOSIS — G43909 Migraine, unspecified, not intractable, without status migrainosus: Secondary | ICD-10-CM | POA: Diagnosis not present

## 2022-04-15 DIAGNOSIS — G5600 Carpal tunnel syndrome, unspecified upper limb: Secondary | ICD-10-CM | POA: Diagnosis not present

## 2022-04-15 DIAGNOSIS — E1122 Type 2 diabetes mellitus with diabetic chronic kidney disease: Secondary | ICD-10-CM | POA: Diagnosis not present

## 2022-04-15 DIAGNOSIS — I1310 Hypertensive heart and chronic kidney disease without heart failure, with stage 1 through stage 4 chronic kidney disease, or unspecified chronic kidney disease: Secondary | ICD-10-CM | POA: Diagnosis not present

## 2022-04-15 DIAGNOSIS — I2510 Atherosclerotic heart disease of native coronary artery without angina pectoris: Secondary | ICD-10-CM | POA: Diagnosis not present

## 2022-04-15 DIAGNOSIS — E785 Hyperlipidemia, unspecified: Secondary | ICD-10-CM | POA: Diagnosis not present

## 2022-04-15 DIAGNOSIS — M4802 Spinal stenosis, cervical region: Secondary | ICD-10-CM | POA: Diagnosis not present

## 2022-04-15 DIAGNOSIS — N2581 Secondary hyperparathyroidism of renal origin: Secondary | ICD-10-CM | POA: Diagnosis not present

## 2022-04-15 DIAGNOSIS — R011 Cardiac murmur, unspecified: Secondary | ICD-10-CM | POA: Diagnosis not present

## 2022-04-15 DIAGNOSIS — M4312 Spondylolisthesis, cervical region: Secondary | ICD-10-CM | POA: Diagnosis not present

## 2022-04-15 DIAGNOSIS — K8020 Calculus of gallbladder without cholecystitis without obstruction: Secondary | ICD-10-CM | POA: Diagnosis not present

## 2022-04-15 DIAGNOSIS — Z471 Aftercare following joint replacement surgery: Secondary | ICD-10-CM | POA: Diagnosis not present

## 2022-04-15 DIAGNOSIS — I4510 Unspecified right bundle-branch block: Secondary | ICD-10-CM | POA: Diagnosis not present

## 2022-04-16 ENCOUNTER — Ambulatory Visit: Payer: Self-pay

## 2022-04-16 NOTE — Patient Outreach (Signed)
  Care Coordination   04/16/2022 Name: [name] MRN: [id number] DOB: 05/24/1946   Care Coordination Outreach Attempts:  An unsuccessful telephone outreach was attempted for a scheduled appointment today.  Follow Up Plan:  Additional outreach attempts will be made to offer the patient care coordination information and services.   Encounter Outcome:  No Answer  Care Coordination Interventions Activated:  No   Care Coordination Interventions:  No, not indicated    Quinn Plowman RN,BSN,CCM [place] [phone] direct line

## 2022-04-19 DIAGNOSIS — I2510 Atherosclerotic heart disease of native coronary artery without angina pectoris: Secondary | ICD-10-CM | POA: Diagnosis not present

## 2022-04-19 DIAGNOSIS — Z471 Aftercare following joint replacement surgery: Secondary | ICD-10-CM | POA: Diagnosis not present

## 2022-04-19 DIAGNOSIS — R011 Cardiac murmur, unspecified: Secondary | ICD-10-CM | POA: Diagnosis not present

## 2022-04-19 DIAGNOSIS — E785 Hyperlipidemia, unspecified: Secondary | ICD-10-CM | POA: Diagnosis not present

## 2022-04-19 DIAGNOSIS — M25561 Pain in right knee: Secondary | ICD-10-CM | POA: Diagnosis not present

## 2022-04-19 DIAGNOSIS — E1122 Type 2 diabetes mellitus with diabetic chronic kidney disease: Secondary | ICD-10-CM | POA: Diagnosis not present

## 2022-04-19 DIAGNOSIS — N184 Chronic kidney disease, stage 4 (severe): Secondary | ICD-10-CM | POA: Diagnosis not present

## 2022-04-19 DIAGNOSIS — M109 Gout, unspecified: Secondary | ICD-10-CM | POA: Diagnosis not present

## 2022-04-19 DIAGNOSIS — K219 Gastro-esophageal reflux disease without esophagitis: Secondary | ICD-10-CM | POA: Diagnosis not present

## 2022-04-19 DIAGNOSIS — G43909 Migraine, unspecified, not intractable, without status migrainosus: Secondary | ICD-10-CM | POA: Diagnosis not present

## 2022-04-19 DIAGNOSIS — M069 Rheumatoid arthritis, unspecified: Secondary | ICD-10-CM | POA: Diagnosis not present

## 2022-04-19 DIAGNOSIS — M4312 Spondylolisthesis, cervical region: Secondary | ICD-10-CM | POA: Diagnosis not present

## 2022-04-19 DIAGNOSIS — M329 Systemic lupus erythematosus, unspecified: Secondary | ICD-10-CM | POA: Diagnosis not present

## 2022-04-19 DIAGNOSIS — D631 Anemia in chronic kidney disease: Secondary | ICD-10-CM | POA: Diagnosis not present

## 2022-04-19 DIAGNOSIS — N2581 Secondary hyperparathyroidism of renal origin: Secondary | ICD-10-CM | POA: Diagnosis not present

## 2022-04-19 DIAGNOSIS — K862 Cyst of pancreas: Secondary | ICD-10-CM | POA: Diagnosis not present

## 2022-04-19 DIAGNOSIS — I4510 Unspecified right bundle-branch block: Secondary | ICD-10-CM | POA: Diagnosis not present

## 2022-04-19 DIAGNOSIS — K8020 Calculus of gallbladder without cholecystitis without obstruction: Secondary | ICD-10-CM | POA: Diagnosis not present

## 2022-04-19 DIAGNOSIS — M5136 Other intervertebral disc degeneration, lumbar region: Secondary | ICD-10-CM | POA: Diagnosis not present

## 2022-04-19 DIAGNOSIS — M4802 Spinal stenosis, cervical region: Secondary | ICD-10-CM | POA: Diagnosis not present

## 2022-04-19 DIAGNOSIS — G4733 Obstructive sleep apnea (adult) (pediatric): Secondary | ICD-10-CM | POA: Diagnosis not present

## 2022-04-19 DIAGNOSIS — I1310 Hypertensive heart and chronic kidney disease without heart failure, with stage 1 through stage 4 chronic kidney disease, or unspecified chronic kidney disease: Secondary | ICD-10-CM | POA: Diagnosis not present

## 2022-04-19 DIAGNOSIS — G5600 Carpal tunnel syndrome, unspecified upper limb: Secondary | ICD-10-CM | POA: Diagnosis not present

## 2022-04-19 DIAGNOSIS — G4700 Insomnia, unspecified: Secondary | ICD-10-CM | POA: Diagnosis not present

## 2022-04-21 ENCOUNTER — Ambulatory Visit: Payer: Self-pay | Admitting: *Deleted

## 2022-04-21 ENCOUNTER — Telehealth: Payer: Self-pay | Admitting: *Deleted

## 2022-04-21 NOTE — Patient Outreach (Signed)
  Care Coordination   Follow Up Visit Note   04/21/2022 Name: MIKAYELA DEATS MRN: [id number] DOB: Jan 20, 1946  [name] is a 76 y.o. year old female who sees Perrin Maltese, MD for primary care. I spoke with  [name] by phone today.  What matters to the patients health and wellness today?  In home care needs    Goals Addressed             This Visit's Progress    In home care needs       Care Coordination Interventions: Follow up phone call to patient to follow up on aid for personal care services Confirmed that [place] services remain in place, PT comes 2x per week Patient confirmed that she did contact [facility] to discuss personal care service agency change, however she is now interested in [facility] services and has placed a request for this-she is waiting on the consent forms to be mailed to her  Patient's daughter continues to provide care and may be possibly be able to be for providing care through the [facility] program This social worker to continue to follow up with patient regarding referral for [facility] services           SDOH assessments and interventions completed:  No     Care Coordination Interventions Activated:  Yes  Care Coordination Interventions:  Yes, provided   Follow up plan: Follow up call scheduled for 05/12/22    Encounter Outcome:  Pt. Visit Completed

## 2022-04-21 NOTE — Patient Instructions (Signed)
Visit Information  Thank you for taking time to visit with me today. Please don't hesitate to contact me if I can be of assistance to you.   Following are the goals we discussed today:   Goals Addressed             This Visit's Progress    In home care needs       Care Coordination Interventions: Follow up phone call to patient to follow up on aid for personal care services Confirmed that [place] services remain in place, PT comes 2x per week Patient confirmed that she did contact [facility] to discuss personal care service agency change, however she is now interested in [facility] services and has placed a request for this-she is waiting on the consent forms to be mailed to her  Patient's daughter continues to provide care and may be possibly be able to be for providing care through the [facility] program This social worker to continue to follow up with patient regarding referral for [facility] services           Our next appointment is by telephone on 05/12/22 at 1pm  Please call the care guide team at [phone] if you need to cancel or reschedule your appointment.   If you are experiencing a Mental Health or [place] or need someone to talk to, please call 911   Patient verbalizes understanding of instructions and care plan provided today and agrees to view in [place]. Active MyChart status and patient understanding of how to access instructions and care plan via MyChart confirmed with patient.     Telephone follow up appointment with care management team member scheduled for: 05/12/22  Elliot Gurney, [place] Worker  [facility] Care Management [phone]

## 2022-04-21 NOTE — Chronic Care Management (AMB) (Signed)
  Care Coordination Note  04/21/2022 Name: [name] MRN: [id number] DOB: [birth year]  [name] is a 76 y.o. year old female who is a primary care patient of Perrin Maltese, MD and is actively engaged with the care management team. I reached out to [employer] by phone today to assist with re-scheduling a follow up visit with the RN Case Manager  Follow up plan: Unsuccessful telephone outreach attempt made. A HIPAA compliant phone message was left for the patient providing contact information and requesting a return call.   Julian Hy, [place] Direct Dial: [phone]

## 2022-04-22 DIAGNOSIS — E1122 Type 2 diabetes mellitus with diabetic chronic kidney disease: Secondary | ICD-10-CM | POA: Diagnosis not present

## 2022-04-22 DIAGNOSIS — G5600 Carpal tunnel syndrome, unspecified upper limb: Secondary | ICD-10-CM | POA: Diagnosis not present

## 2022-04-22 DIAGNOSIS — M4802 Spinal stenosis, cervical region: Secondary | ICD-10-CM | POA: Diagnosis not present

## 2022-04-22 DIAGNOSIS — I2510 Atherosclerotic heart disease of native coronary artery without angina pectoris: Secondary | ICD-10-CM | POA: Diagnosis not present

## 2022-04-22 DIAGNOSIS — I4510 Unspecified right bundle-branch block: Secondary | ICD-10-CM | POA: Diagnosis not present

## 2022-04-22 DIAGNOSIS — M4312 Spondylolisthesis, cervical region: Secondary | ICD-10-CM | POA: Diagnosis not present

## 2022-04-22 DIAGNOSIS — M25561 Pain in right knee: Secondary | ICD-10-CM | POA: Diagnosis not present

## 2022-04-22 DIAGNOSIS — M109 Gout, unspecified: Secondary | ICD-10-CM | POA: Diagnosis not present

## 2022-04-22 DIAGNOSIS — K862 Cyst of pancreas: Secondary | ICD-10-CM | POA: Diagnosis not present

## 2022-04-22 DIAGNOSIS — M329 Systemic lupus erythematosus, unspecified: Secondary | ICD-10-CM | POA: Diagnosis not present

## 2022-04-22 DIAGNOSIS — N184 Chronic kidney disease, stage 4 (severe): Secondary | ICD-10-CM | POA: Diagnosis not present

## 2022-04-22 DIAGNOSIS — G4733 Obstructive sleep apnea (adult) (pediatric): Secondary | ICD-10-CM | POA: Diagnosis not present

## 2022-04-22 DIAGNOSIS — G43909 Migraine, unspecified, not intractable, without status migrainosus: Secondary | ICD-10-CM | POA: Diagnosis not present

## 2022-04-22 DIAGNOSIS — Z471 Aftercare following joint replacement surgery: Secondary | ICD-10-CM | POA: Diagnosis not present

## 2022-04-22 DIAGNOSIS — I1310 Hypertensive heart and chronic kidney disease without heart failure, with stage 1 through stage 4 chronic kidney disease, or unspecified chronic kidney disease: Secondary | ICD-10-CM | POA: Diagnosis not present

## 2022-04-22 DIAGNOSIS — N2581 Secondary hyperparathyroidism of renal origin: Secondary | ICD-10-CM | POA: Diagnosis not present

## 2022-04-22 DIAGNOSIS — R011 Cardiac murmur, unspecified: Secondary | ICD-10-CM | POA: Diagnosis not present

## 2022-04-22 DIAGNOSIS — K219 Gastro-esophageal reflux disease without esophagitis: Secondary | ICD-10-CM | POA: Diagnosis not present

## 2022-04-22 DIAGNOSIS — E785 Hyperlipidemia, unspecified: Secondary | ICD-10-CM | POA: Diagnosis not present

## 2022-04-22 DIAGNOSIS — M069 Rheumatoid arthritis, unspecified: Secondary | ICD-10-CM | POA: Diagnosis not present

## 2022-04-22 DIAGNOSIS — M5136 Other intervertebral disc degeneration, lumbar region: Secondary | ICD-10-CM | POA: Diagnosis not present

## 2022-04-22 DIAGNOSIS — G4700 Insomnia, unspecified: Secondary | ICD-10-CM | POA: Diagnosis not present

## 2022-04-22 DIAGNOSIS — K8020 Calculus of gallbladder without cholecystitis without obstruction: Secondary | ICD-10-CM | POA: Diagnosis not present

## 2022-04-22 DIAGNOSIS — D631 Anemia in chronic kidney disease: Secondary | ICD-10-CM | POA: Diagnosis not present

## 2022-04-22 NOTE — Chronic Care Management (AMB) (Signed)
  Care Coordination Note  04/22/2022 Name: [name] MRN: [id number] DOB: [birth year]  [name] is a 76 y.o. year old female who is a primary care patient of Perrin Maltese, MD and is actively engaged with the care management team. I reached out to [employer] by phone today to assist with re-scheduling a follow up visit with the RN Case Manager  Follow up plan: Telephone appointment with care management team member scheduled for: 07/07/2022  Julian Hy, [place] Direct Dial: [phone]

## 2022-04-26 ENCOUNTER — Ambulatory Visit
Admission: RE | Admit: 2022-04-26 | Discharge: 2022-04-26 | Disposition: A | Discharge status: Home / Self Care | Payer: Self-pay | Source: Ambulatory Visit | Attending: Neurosurgery | Admitting: Neurosurgery

## 2022-04-26 ENCOUNTER — Other Ambulatory Visit: Payer: Self-pay

## 2022-04-26 DIAGNOSIS — Z049 Encounter for examination and observation for unspecified reason: Secondary | ICD-10-CM

## 2022-04-28 DIAGNOSIS — N261 Atrophy of kidney (terminal): Secondary | ICD-10-CM | POA: Diagnosis not present

## 2022-04-28 DIAGNOSIS — D6862 Lupus anticoagulant syndrome: Secondary | ICD-10-CM | POA: Diagnosis not present

## 2022-04-28 DIAGNOSIS — D631 Anemia in chronic kidney disease: Secondary | ICD-10-CM | POA: Diagnosis not present

## 2022-04-28 DIAGNOSIS — R809 Proteinuria, unspecified: Secondary | ICD-10-CM | POA: Diagnosis not present

## 2022-04-28 DIAGNOSIS — N184 Chronic kidney disease, stage 4 (severe): Secondary | ICD-10-CM | POA: Diagnosis not present

## 2022-04-28 DIAGNOSIS — I10 Essential (primary) hypertension: Secondary | ICD-10-CM | POA: Diagnosis not present

## 2022-04-28 DIAGNOSIS — I2510 Atherosclerotic heart disease of native coronary artery without angina pectoris: Secondary | ICD-10-CM | POA: Diagnosis not present

## 2022-04-28 DIAGNOSIS — M0560 Rheumatoid arthritis of unspecified site with involvement of other organs and systems: Secondary | ICD-10-CM | POA: Diagnosis not present

## 2022-04-28 DIAGNOSIS — E785 Hyperlipidemia, unspecified: Secondary | ICD-10-CM | POA: Diagnosis not present

## 2022-04-28 DIAGNOSIS — N2581 Secondary hyperparathyroidism of renal origin: Secondary | ICD-10-CM | POA: Diagnosis not present

## 2022-04-28 DIAGNOSIS — N281 Cyst of kidney, acquired: Secondary | ICD-10-CM | POA: Diagnosis not present

## 2022-04-28 DIAGNOSIS — E119 Type 2 diabetes mellitus without complications: Secondary | ICD-10-CM | POA: Diagnosis not present

## 2022-04-28 NOTE — Progress Notes (Unsigned)
Referring Physician:  Corky Mull, MD [facility] [place],  [place] [address]  Primary Physician:  Perrin Maltese, MD  History of Present Illness: 04/29/2022 [name] is here today with a chief complaint of low back pain that radiates in the bilateral hips and legs, right greater than left. Occasionally she will have numbness and tingling in the legs.  She mostly has pain in the right leg.  It has been going on for several months.  She reports pain as bad as 10 out of 10 with standing, walking, bending, lifting, and squatting.   Bowel/Bladder Dysfunction: none  Conservative measures:  Physical therapy: has not participated in Multimodal medical therapy including regular antiinflammatories: gabapentin, prednisone, tizanidine, tylenol Injections: has not received epidural steroid injections  Past Surgery:  Lumbar Surgery around 2008-2009 Cervical Surgery Brain Surgery for Aneurysm in 2013  [name] has no symptoms of cervical myelopathy.  The symptoms are causing a significant impact on the patient's life.   Review of Systems:  A 10 point review of systems is negative, except for the pertinent positives and negatives detailed in the HPI.  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 b.)07/18/2006 -endovas oblit complex ACOM aneur. c.) Endovas Tx of 6.8x4mm3mCA aneur. d.) 3.5x3mm 63mnant RMCA aneur 2/2 coil compaction. e.) Interval 3.2x3.1mm s1mular outpouching in ACOM c/w mild recannulization in neck. 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 g.) 3.7x2mm re64mnt of prev Tx'd ACOM aneur -failed embol 02/24/2012.   CKD (chronic kidney disease), stage IV (HCC)    a.) solitary functioning kidney on the RIGHT   Complication of anesthesia    a.) delayed emergence    Coronary artery disease 02/23/2007   a.) LHC 02/23/2007 --> EF 50%; 30% pLAD, 70% mRCA --> planned for staged PCI. b.) PCI 02/27/2007: EF 60%; 3.5 x 15 mm Vision BMS to 80% mRCA. b.) LHC 07/24/2007: EF 60%; minor irregs; no occlusive CAD; no intervention. c.) LHC 10/28/2010: EF 60%; 30% mLAD, LCx with minor luminal irregs, 20% ISR mRCA; no intervention. d.) LHC 05/31/2013: EF 60%; 40% mLAD, 30% ISR m-dRCA; no interventions.   DDD (degenerative disc disease), cervical    a.) s/p ACDF C5-C7; hardware in neck; patient appreciates stiffness and issues with mobility   Diastolic dysfunction    a.)  TTE 07/07/2021: EF 87.8%; normal PASP; trace TR/MR; G1DD.   Elevated hemidiaphragm    a.) LEFT   Headache(784.0)    Heart murmur    HLD (hyperlipidemia)    HOH (hard of hearing)    Has hearing aids, doesn't wear   Hyperparathyroidism due to renal insufficiency (HCC)    Hypertension    Incomplete right bundle branch block (RBBB)    Insomnia    a.) takes zolpidem   Long term (current) use of immunomodulator    a.) on DMARD therapy (hydroxychloriquine) for RA/SLE Dx.   Long term current use of antithrombotics/antiplatelets    a.) clopidogrel   Multiple acquired cysts of kidney    OSA (obstructive sleep apnea)    a.) does NOT use nocturnal pap therapy   Pancreatic cyst    Pneumonia    RBBB (right bundle branch block)    Rheumatoid arthritis (HCC)   [place]) on DMARD; hydroxychloriquine   Systemic lupus erythematosus (  [name])    T2DM (type 2 diabetes mellitus) ([name])    Wears dentures    partial lower    Past Surgical History: Past Surgical History:  Procedure Laterality Date   ANEURYSM COILING  08/2013   Attempted embolization of previously treated ACOM aneurysm; unsuccessful N/A 01/28/2012   Location: [place] CYST MARSUPIALIZATION     BICEPT TENODESIS Right 03/02/2022   Procedure: BICEPS TENODESIS;  Surgeon: Corky Mull, MD;  Location: [facility];   Service: Orthopedics;  Laterality: Right;   BREAST CYST ASPIRATION Right 01/25/2012   FNA neg.   BREAST EXCISIONAL BIOPSY Left 06/26/2007   neg   BUNIONECTOMY Bilateral 2022   CARDIAC CATHETERIZATION Left 02/23/2007   Procedure: CARDIAC CATHETERIZATION; Location: [place]; Surgeon: Neoma Laming, MD   CARDIAC CATHETERIZATION Left 07/31/2007   Procedure: CARDIAC CATHETERIZATION; Location: [place]; Surgeon: Neoma Laming, MD   CARDIAC CATHETERIZATION Left 10/28/2010   Procedure: CARDIAC CATHETERIZATION; Location: [place]; Surgeon: Neoma Laming, MD   CARDIAC CATHETERIZATION Left 05/31/2013   Procedure: CARDIAC CATHETERIZATION; Location: [place]; Surgeon: Neoma Laming, MD   Carotid arteriogram; 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 N/A 07/18/2006   Location: [facility]   CATARACT EXTRACTION     CERVICAL FUSION N/A    Procedure: ACDF C5-C7   COLONOSCOPY WITH PROPOFOL N/A 10/04/2019   Procedure: COLONOSCOPY WITH BIOPSY;  Surgeon: Lin Landsman, MD;  Location: [place];  Service: Endoscopy;  Laterality: N/A;  Diabetic (borderline) - oral meds priority 3   CORONARY STENT INTERVENTION Left 02/27/2007   Procedure: CORONARY STENT INTERVENTION (3.5 x 15 mm Vision BMS to mRCA); Location: [place]; Surgeon: Kathlyn Sacramento, MD   Endovascular near complete obliteration of enlarging neck remnant of previously treated RIGHT MCA aneurysm using stent assisted coiling N/A 09/03/2010   Location: [facility]   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 N/A 10/03/2006   Location: [facility]   HYSTEROSCOPY WITH D & C N/A 08/04/2021   Procedure: DILATATION AND CURETTAGE /HYSTEROSCOPY;  Surgeon: Homero Fellers, MD;  Location: [facility];  Service: Gynecology;  Laterality: N/A;   POLYPECTOMY N/A 10/04/2019   Procedure: POLYPECTOMY;  Surgeon: Lin Landsman, MD;  Location: [place];  Service: Endoscopy;   Laterality: N/A;   REVERSE SHOULDER ARTHROPLASTY Right 03/02/2022   Procedure: REVERSE SHOULDER ARTHROPLASTY;  Surgeon: Corky Mull, MD;  Location: [facility];  Service: Orthopedics;  Laterality: Right;   UPPER ESOPHAGEAL ENDOSCOPIC ULTRASOUND (EUS) N/A 04/15/2016   Procedure: UPPER ESOPHAGEAL ENDOSCOPIC ULTRASOUND (EUS);  Surgeon: Holly Bodily, MD;  Location: [facility];  Service: Gastroenterology;  Laterality: N/A;    Allergies: Allergies as of 04/29/2022 - Review Complete 04/29/2022  Allergen Reaction Noted   Isosorbide dinitrate Other (See Comments) 07/15/2017   Amlodipine Swelling 07/23/2011   Sulfa antibiotics Itching 01/13/2012   Oxycodone Itching 04/14/2021   Sulfasalazine Itching 01/13/2012    Medications: Current Meds  Medication Sig   acetaminophen (TYLENOL) 500 MG tablet Take 2 tablets (1,000 mg total) by mouth every 6 (six) hours as needed.   allopurinol (ZYLOPRIM) 100 MG tablet Take 100 mg by mouth every morning.   clopidogrel (PLAVIX) 75 MG tablet Take 75 mg by mouth daily.   clotrimazole-betamethasone (LOTRISONE) cream Apply 1 application topically 2 (two) times daily as needed for rash.   furosemide (LASIX) 40 MG tablet Take 40 mg by mouth every morning.   gabapentin (NEURONTIN) 100  MG capsule Take 100 mg by mouth at bedtime as needed (pain).   glimepiride (AMARYL) 2 MG tablet Take 1-2 mg by mouth See admin instructions. Take 1 mg daily, may increase to 2 mg if blood sugar is over 175   hydrALAZINE (APRESOLINE) 100 MG tablet Take 100 mg by mouth daily.   hydroxychloroquine (PLAQUENIL) 200 MG tablet Take 200 mg by mouth daily.   labetalol (NORMODYNE) 300 MG tablet Take 300 mg by mouth 2 (two) times daily.   losartan (COZAAR) 100 MG tablet Take 100 mg by mouth every morning.   ondansetron (ZOFRAN) 4 MG tablet Take 1 tablet (4 mg total) by mouth every 6 (six) hours as needed for nausea.   rosuvastatin (CRESTOR) 40 MG tablet Take 40 mg by mouth every morning.  Patient reports that she is taking this daily   SYMBICORT 80-4.5 MCG/ACT inhaler Inhale 1 puff into the lungs 2 (two) times daily as needed (shortness of breath).   traMADol (ULTRAM) 50 MG tablet Take 1 tablet (50 mg total) by mouth every 6 (six) hours as needed for moderate pain or severe pain.   tretinoin (RETIN-A) 0.025 % cream Apply 1 application topically daily as needed (acne).   zolpidem (AMBIEN) 5 MG tablet Take 5 mg by mouth at bedtime.   [DISCONTINUED] traMADol (ULTRAM) 50 MG tablet Take 1 tablet (50 mg total) by mouth every 6 (six) hours as needed.    Social History: Social History   Tobacco Use   Smoking status: Never   Smokeless tobacco: Never  Vaping Use   Vaping Use: Never used  Substance Use Topics   Alcohol use: Yes    Comment: Occasionally at christmas   Drug use: No    Family Medical History: Family History  Problem Relation Age of Onset   Breast cancer Paternal Aunt 49   Breast cancer Paternal 26    Heart disease Mother    Heart disease Father    Stroke Father    Diabetes Sister    Diabetes Brother     Physical Examination: There were no vitals filed for this visit.  General: Patient is well developed, well nourished, calm, collected, and in no apparent distress. Attention to examination is appropriate.  Neck:   Supple.  Full range of motion.  Respiratory: Patient is breathing without any difficulty.   NEUROLOGICAL:     Awake, alert, oriented to person, place, and time.  Speech is clear and fluent. Fund of knowledge is appropriate.   Cranial Nerves: Pupils equal round and reactive to light.  Facial tone is symmetric.  Facial sensation is symmetric. Shoulder shrug is symmetric. Tongue protrusion is midline.  There is no pronator drift.  ROM of spine: full.    Strength: Side Biceps Triceps Deltoid Interossei Grip Wrist Ext. Wrist Flex.  R '5 5 5 5 5 5 5  '$ L '5 5 5 5 5 5 5   '$ Side Iliopsoas Quads Hamstring PF DF EHL  R '5 5 5 5 5 5  '$ L '5 5 5 5 5  5   '$ Reflexes are 1+ and symmetric at the biceps, triceps, brachioradialis, patella and achilles.   Hoffman's is absent.   Bilateral upper and lower extremity sensation is intact to light touch.    No evidence of dysmetria noted.  Gait is abnormal and requires a cane.     Medical Decision Making  Imaging: MRI L spine 10/02/2021 T12-L1: Disc bulge, new from the prior MRI. Progressive mild facet arthrosis and ligamentum  flavum hypertrophy. Small volume fluid within the bilateral facet joints. No significant spinal canal or foraminal stenosis.   L1-L2: Progressive disc bulge. Progressive disc bulge. New superimposed small central disc extrusion with slight cranial and caudal migration. A small right subarticular/foraminal disc protrusion is also new. Progressive facet arthrosis (mild right, moderate left) and ligamentum flavum hypertrophy. There is now mild bilateral subarticular narrowing at this level (without appreciable nerve root impingement). Mild-to-moderate right neural foraminal narrowing is new from the prior MRI.   L2-L3: Small disc bulge asymmetric to the left. Facet arthrosis (moderate right, moderate/advanced left) with ligamentum flavum hypertrophy, progressed. There is now mild left greater than right subarticular narrowing at this level (without appreciable nerve root impingement). Mild narrowing of the central canal is new from the prior MRI. Mild relative left neural foraminal narrowing, also new.   L3-L4: Small central zone posterior annular fissure, new from the prior MRI. Disc bulge, also new. Advanced facet arthrosis with ligamentum flavum hypertrophy, progressed. Bilateral facet joint effusions, new from the prior MRI. Severe central canal and left subarticular stenosis, new from the prior MRI. Moderate right subarticular stenosis is also new. There is now bilateral neural foraminal narrowing at this level (moderate right, mild left).   L4-L5: Interval  posterior decompression and posterior spinal fusion. 3 mm fused grade 1 anterolisthesis. Endplate osteophytes. Facet arthrosis and ligamentum flavum hypertrophy. Mild-to-moderate spinal canal narrowing at the level of the L4 inferior endplate, improved from the prior MRI. Endplate osteophytes contribute to moderate right neural foraminal narrowing. No appreciable left foraminal stenosis.   L5-S1: 3 mm grade 1 anterolisthesis. Disc uncovering. Central to left foraminal zone posterior annular fissure. Facet arthrosis (moderate right, moderate/advanced left). Slight ligamentum flavum hypertrophy on the left. Moderate left subarticular stenosis with crowding of the descending left S1 nerve root (series 10, image 31). Mild right subarticular narrowing (without appreciable nerve root impingement). Central canal patent. Mild relative left neural foraminal narrowing.   IMPRESSION: Since the prior lumbar spine MRI 02/26/2006, there has been interval posterior decompression and posterior spinal fusion at L4-L5. Multifactorial mild-to-moderate spinal canal narrowing at the level of the L4 inferior endplate has improved. Endplate osteophytes contribute to moderate right neural foraminal narrowing at this level.   Additional lumbar and lower thoracic spondylosis, as outlined and having progressed at multiple levels since the prior MRI of 02/26/2006. Findings are most notably as follows.   At L3-L4, there is progressive multifactorial severe central canal and left subarticular stenosis, and progressive moderate right subarticular stenosis. Progressive moderate bilateral neural foraminal narrowing at this level. Also of note, facet arthrosis is advanced at this level and bilateral facet joint effusions are present.   No more than mild relative narrowing of the central canal at the remaining levels. Additional sites of subarticular stenosis, as detailed and greatest on the left at L5-S1  (moderate at this site with crowding of the descending left S1 nerve root).   Progressive advanced disc degeneration at L1-L2.     Electronically Signed   By: Kellie Simmering D.O.   On: 10/02/2021 09:20  I have personally reviewed the images and agree with the above interpretation.  Assessment and Plan: Ms. Blatchford is a pleasant 76 y.o. 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  I think she is likely to need surgical intervention, but we will complete her conservative management with physical therapy before moving forward.  I will also order a CT scan to evaluate the status  of her fusion.   I spent a total of 30 minutes in face-to-face and non-face-to-face activities related to this patient's care today.  Thank you for involving me in the care of this patient.      Lemon Whitacre K. Izora Ribas MD, [facility] Neurosurgery

## 2022-04-29 ENCOUNTER — Encounter: Payer: Self-pay | Admitting: Neurosurgery

## 2022-04-29 ENCOUNTER — Ambulatory Visit (INDEPENDENT_AMBULATORY_CARE_PROVIDER_SITE_OTHER): Payer: Medicare Other | Admitting: Neurosurgery

## 2022-04-29 VITALS — Ht 63.0 in | Wt 172.0 lb

## 2022-04-29 DIAGNOSIS — M4316 Spondylolisthesis, lumbar region: Secondary | ICD-10-CM

## 2022-04-29 DIAGNOSIS — M5136 Other intervertebral disc degeneration, lumbar region: Secondary | ICD-10-CM | POA: Diagnosis not present

## 2022-04-29 DIAGNOSIS — Z981 Arthrodesis status: Secondary | ICD-10-CM

## 2022-04-30 DIAGNOSIS — M329 Systemic lupus erythematosus, unspecified: Secondary | ICD-10-CM | POA: Diagnosis not present

## 2022-04-30 DIAGNOSIS — N184 Chronic kidney disease, stage 4 (severe): Secondary | ICD-10-CM | POA: Diagnosis not present

## 2022-04-30 DIAGNOSIS — I4510 Unspecified right bundle-branch block: Secondary | ICD-10-CM | POA: Diagnosis not present

## 2022-04-30 DIAGNOSIS — R011 Cardiac murmur, unspecified: Secondary | ICD-10-CM | POA: Diagnosis not present

## 2022-04-30 DIAGNOSIS — E785 Hyperlipidemia, unspecified: Secondary | ICD-10-CM | POA: Diagnosis not present

## 2022-04-30 DIAGNOSIS — G4700 Insomnia, unspecified: Secondary | ICD-10-CM | POA: Diagnosis not present

## 2022-04-30 DIAGNOSIS — M5136 Other intervertebral disc degeneration, lumbar region: Secondary | ICD-10-CM | POA: Diagnosis not present

## 2022-04-30 DIAGNOSIS — K862 Cyst of pancreas: Secondary | ICD-10-CM | POA: Diagnosis not present

## 2022-04-30 DIAGNOSIS — Z471 Aftercare following joint replacement surgery: Secondary | ICD-10-CM | POA: Diagnosis not present

## 2022-04-30 DIAGNOSIS — K219 Gastro-esophageal reflux disease without esophagitis: Secondary | ICD-10-CM | POA: Diagnosis not present

## 2022-04-30 DIAGNOSIS — K8020 Calculus of gallbladder without cholecystitis without obstruction: Secondary | ICD-10-CM | POA: Diagnosis not present

## 2022-04-30 DIAGNOSIS — I2510 Atherosclerotic heart disease of native coronary artery without angina pectoris: Secondary | ICD-10-CM | POA: Diagnosis not present

## 2022-04-30 DIAGNOSIS — G43909 Migraine, unspecified, not intractable, without status migrainosus: Secondary | ICD-10-CM | POA: Diagnosis not present

## 2022-04-30 DIAGNOSIS — G5600 Carpal tunnel syndrome, unspecified upper limb: Secondary | ICD-10-CM | POA: Diagnosis not present

## 2022-04-30 DIAGNOSIS — D631 Anemia in chronic kidney disease: Secondary | ICD-10-CM | POA: Diagnosis not present

## 2022-04-30 DIAGNOSIS — I1310 Hypertensive heart and chronic kidney disease without heart failure, with stage 1 through stage 4 chronic kidney disease, or unspecified chronic kidney disease: Secondary | ICD-10-CM | POA: Diagnosis not present

## 2022-04-30 DIAGNOSIS — G4733 Obstructive sleep apnea (adult) (pediatric): Secondary | ICD-10-CM | POA: Diagnosis not present

## 2022-04-30 DIAGNOSIS — N2581 Secondary hyperparathyroidism of renal origin: Secondary | ICD-10-CM | POA: Diagnosis not present

## 2022-04-30 DIAGNOSIS — M4312 Spondylolisthesis, cervical region: Secondary | ICD-10-CM | POA: Diagnosis not present

## 2022-04-30 DIAGNOSIS — M069 Rheumatoid arthritis, unspecified: Secondary | ICD-10-CM | POA: Diagnosis not present

## 2022-04-30 DIAGNOSIS — M4802 Spinal stenosis, cervical region: Secondary | ICD-10-CM | POA: Diagnosis not present

## 2022-04-30 DIAGNOSIS — M25561 Pain in right knee: Secondary | ICD-10-CM | POA: Diagnosis not present

## 2022-04-30 DIAGNOSIS — E1122 Type 2 diabetes mellitus with diabetic chronic kidney disease: Secondary | ICD-10-CM | POA: Diagnosis not present

## 2022-04-30 DIAGNOSIS — M109 Gout, unspecified: Secondary | ICD-10-CM | POA: Diagnosis not present

## 2022-05-03 DIAGNOSIS — M25561 Pain in right knee: Secondary | ICD-10-CM | POA: Diagnosis not present

## 2022-05-03 DIAGNOSIS — E785 Hyperlipidemia, unspecified: Secondary | ICD-10-CM | POA: Diagnosis not present

## 2022-05-03 DIAGNOSIS — I1310 Hypertensive heart and chronic kidney disease without heart failure, with stage 1 through stage 4 chronic kidney disease, or unspecified chronic kidney disease: Secondary | ICD-10-CM | POA: Diagnosis not present

## 2022-05-03 DIAGNOSIS — K8020 Calculus of gallbladder without cholecystitis without obstruction: Secondary | ICD-10-CM | POA: Diagnosis not present

## 2022-05-03 DIAGNOSIS — G4700 Insomnia, unspecified: Secondary | ICD-10-CM | POA: Diagnosis not present

## 2022-05-03 DIAGNOSIS — G43909 Migraine, unspecified, not intractable, without status migrainosus: Secondary | ICD-10-CM | POA: Diagnosis not present

## 2022-05-03 DIAGNOSIS — G4733 Obstructive sleep apnea (adult) (pediatric): Secondary | ICD-10-CM | POA: Diagnosis not present

## 2022-05-03 DIAGNOSIS — N184 Chronic kidney disease, stage 4 (severe): Secondary | ICD-10-CM | POA: Diagnosis not present

## 2022-05-03 DIAGNOSIS — E1122 Type 2 diabetes mellitus with diabetic chronic kidney disease: Secondary | ICD-10-CM | POA: Diagnosis not present

## 2022-05-03 DIAGNOSIS — M109 Gout, unspecified: Secondary | ICD-10-CM | POA: Diagnosis not present

## 2022-05-03 DIAGNOSIS — M4802 Spinal stenosis, cervical region: Secondary | ICD-10-CM | POA: Diagnosis not present

## 2022-05-03 DIAGNOSIS — R011 Cardiac murmur, unspecified: Secondary | ICD-10-CM | POA: Diagnosis not present

## 2022-05-03 DIAGNOSIS — M4312 Spondylolisthesis, cervical region: Secondary | ICD-10-CM | POA: Diagnosis not present

## 2022-05-03 DIAGNOSIS — K862 Cyst of pancreas: Secondary | ICD-10-CM | POA: Diagnosis not present

## 2022-05-03 DIAGNOSIS — D631 Anemia in chronic kidney disease: Secondary | ICD-10-CM | POA: Diagnosis not present

## 2022-05-03 DIAGNOSIS — Z471 Aftercare following joint replacement surgery: Secondary | ICD-10-CM | POA: Diagnosis not present

## 2022-05-03 DIAGNOSIS — G5600 Carpal tunnel syndrome, unspecified upper limb: Secondary | ICD-10-CM | POA: Diagnosis not present

## 2022-05-03 DIAGNOSIS — I4510 Unspecified right bundle-branch block: Secondary | ICD-10-CM | POA: Diagnosis not present

## 2022-05-03 DIAGNOSIS — M329 Systemic lupus erythematosus, unspecified: Secondary | ICD-10-CM | POA: Diagnosis not present

## 2022-05-03 DIAGNOSIS — M069 Rheumatoid arthritis, unspecified: Secondary | ICD-10-CM | POA: Diagnosis not present

## 2022-05-03 DIAGNOSIS — M5136 Other intervertebral disc degeneration, lumbar region: Secondary | ICD-10-CM | POA: Diagnosis not present

## 2022-05-03 DIAGNOSIS — I2510 Atherosclerotic heart disease of native coronary artery without angina pectoris: Secondary | ICD-10-CM | POA: Diagnosis not present

## 2022-05-03 DIAGNOSIS — N2581 Secondary hyperparathyroidism of renal origin: Secondary | ICD-10-CM | POA: Diagnosis not present

## 2022-05-03 DIAGNOSIS — K219 Gastro-esophageal reflux disease without esophagitis: Secondary | ICD-10-CM | POA: Diagnosis not present

## 2022-05-04 ENCOUNTER — Other Ambulatory Visit: Payer: Self-pay | Admitting: Internal Medicine

## 2022-05-04 ENCOUNTER — Encounter: Payer: Self-pay | Admitting: Internal Medicine

## 2022-05-04 DIAGNOSIS — E782 Mixed hyperlipidemia: Secondary | ICD-10-CM | POA: Diagnosis not present

## 2022-05-04 DIAGNOSIS — I2510 Atherosclerotic heart disease of native coronary artery without angina pectoris: Secondary | ICD-10-CM | POA: Diagnosis not present

## 2022-05-04 DIAGNOSIS — N63 Unspecified lump in unspecified breast: Secondary | ICD-10-CM

## 2022-05-04 DIAGNOSIS — I10 Essential (primary) hypertension: Secondary | ICD-10-CM | POA: Diagnosis not present

## 2022-05-04 DIAGNOSIS — E1122 Type 2 diabetes mellitus with diabetic chronic kidney disease: Secondary | ICD-10-CM | POA: Diagnosis not present

## 2022-05-05 ENCOUNTER — Ambulatory Visit
Admission: RE | Admit: 2022-05-05 | Discharge: 2022-05-05 | Disposition: A | Discharge status: Home / Self Care | Payer: Medicare Other | Source: Ambulatory Visit | Attending: Internal Medicine | Admitting: Internal Medicine

## 2022-05-05 ENCOUNTER — Encounter: Payer: Self-pay | Admitting: Family Medicine

## 2022-05-05 DIAGNOSIS — N6001 Solitary cyst of right breast: Secondary | ICD-10-CM | POA: Diagnosis not present

## 2022-05-05 DIAGNOSIS — N6315 Unspecified lump in the right breast, overlapping quadrants: Secondary | ICD-10-CM | POA: Diagnosis not present

## 2022-05-05 DIAGNOSIS — N63 Unspecified lump in unspecified breast: Secondary | ICD-10-CM | POA: Insufficient documentation

## 2022-05-05 DIAGNOSIS — N644 Mastodynia: Secondary | ICD-10-CM | POA: Insufficient documentation

## 2022-05-06 ENCOUNTER — Other Ambulatory Visit: Payer: Self-pay | Admitting: Internal Medicine

## 2022-05-06 ENCOUNTER — Ambulatory Visit
Admission: RE | Admit: 2022-05-06 | Discharge: 2022-05-06 | Disposition: A | Discharge status: Home / Self Care | Payer: Medicare Other | Source: Ambulatory Visit | Attending: Neurosurgery | Admitting: Neurosurgery

## 2022-05-06 DIAGNOSIS — M545 Low back pain, unspecified: Secondary | ICD-10-CM | POA: Diagnosis not present

## 2022-05-06 DIAGNOSIS — Z981 Arthrodesis status: Secondary | ICD-10-CM | POA: Diagnosis not present

## 2022-05-06 DIAGNOSIS — N6001 Solitary cyst of right breast: Secondary | ICD-10-CM

## 2022-05-06 DIAGNOSIS — M5136 Other intervertebral disc degeneration, lumbar region: Secondary | ICD-10-CM | POA: Diagnosis not present

## 2022-05-06 DIAGNOSIS — M4316 Spondylolisthesis, lumbar region: Secondary | ICD-10-CM | POA: Insufficient documentation

## 2022-05-06 DIAGNOSIS — R928 Other abnormal and inconclusive findings on diagnostic imaging of breast: Secondary | ICD-10-CM

## 2022-05-11 ENCOUNTER — Encounter: Payer: Self-pay | Admitting: Neurosurgery

## 2022-05-11 ENCOUNTER — Telehealth: Payer: Self-pay

## 2022-05-11 NOTE — Telephone Encounter (Signed)
Dr Izora Ribas sent her a mychart message

## 2022-05-11 NOTE — Telephone Encounter (Signed)
-----   Message from Peggyann Shoals sent at 05/11/2022  1:45 PM EST ----- Regarding: CT results Contact: [phone] Patient had her CT scan. Her f/u appt with Dr.Yarbrough is not until 06/29/22. Can she get her results or will she have to wait until her appt? She has not started PT yet she will call them today to get an appt. She does [place] mychart if you want to send her a message with the results. She did state that the pain is greater on the right side.

## 2022-05-12 ENCOUNTER — Encounter: Payer: Self-pay | Admitting: *Deleted

## 2022-05-12 ENCOUNTER — Telehealth: Payer: Self-pay | Admitting: *Deleted

## 2022-05-12 NOTE — Patient Outreach (Signed)
  Care Coordination   05/12/2022 Name: [name] MRN: [id number] DOB: April 12, 1946   Care Coordination Outreach Attempts:  An unsuccessful telephone outreach was attempted for a scheduled appointment today.  Follow Up Plan:  Additional outreach attempts will be made to offer the patient care coordination information and services.   Encounter Outcome:  No Answer   Care Coordination Interventions:  No, not indicated    Rafaella Kole, [place] Worker  [facility] Care Management [phone]

## 2022-05-24 ENCOUNTER — Ambulatory Visit: Payer: Self-pay | Admitting: *Deleted

## 2022-05-25 NOTE — Patient Outreach (Signed)
  Care Coordination   Follow Up Visit Note   05/25/2022 Name: [name] MRN: [id number] DOB: 1946-01-17  [name] is a 76 y.o. year old female who sees Perrin Maltese, MD for primary care. I spoke with  [name] by phone today.  What matters to the patients health and wellness today?  Approval for the [facility] Program    Goals Addressed             This Visit's Progress    In home care needs       Care Coordination Interventions: Follow up phone call from patient to follow up on aid for personal care services Patient confirmed that the paperwork for the  CAPS program have been completed and faxed to DSS Approval process discussed, patient will be contacted for a in home nursing assessment to further determine eligibility for the CAPS program This social worker to continue to follow up with patient regarding referral for CAPS services           SDOH assessments and interventions completed:  No     Care Coordination Interventions:  Yes, provided   Follow up plan: Follow up call scheduled for 06/07/22    Encounter Outcome:  Pt. Visit Completed

## 2022-05-25 NOTE — Patient Instructions (Signed)
Visit Information  Thank you for taking time to visit with me today. Please don't hesitate to contact me if I can be of assistance to you.   Following are the goals we discussed today:   Goals Addressed             This Visit's Progress    In home care needs       Care Coordination Interventions: Follow up phone call from patient to follow up on aid for personal care services Patient confirmed that the paperwork for the  CAPS program have been completed and faxed to DSS Approval process discussed, patient will be contacted for a in home nursing assessment to further determine eligibility for the CAPS program This social worker to continue to follow up with patient regarding referral for CAPS services           Our next appointment is by telephone on 06/07/22 at 2pm  Please call the care guide team at [phone] if you need to cancel or reschedule your appointment.   If you are experiencing a Mental Health or [place] or need someone to talk to, please call 911   Patient verbalizes understanding of instructions and care plan provided today and agrees to view in [place]. Active MyChart status and patient understanding of how to access instructions and care plan via MyChart confirmed with patient.     Telephone follow up appointment with care management team member scheduled for: 06/07/22  Elliot Gurney, [place] Worker  [facility] Care Management [phone]

## 2022-05-27 ENCOUNTER — Ambulatory Visit
Admission: RE | Admit: 2022-05-27 | Discharge: 2022-05-27 | Disposition: A | Discharge status: Home / Self Care | Payer: Medicare Other | Source: Ambulatory Visit | Attending: Internal Medicine | Admitting: Internal Medicine

## 2022-05-27 DIAGNOSIS — R928 Other abnormal and inconclusive findings on diagnostic imaging of breast: Secondary | ICD-10-CM | POA: Insufficient documentation

## 2022-05-27 DIAGNOSIS — N6001 Solitary cyst of right breast: Secondary | ICD-10-CM | POA: Diagnosis not present

## 2022-05-27 MED ORDER — LIDOCAINE HCL (PF) 1 % IJ SOLN
10.0000 mL | Freq: Once | INTRAMUSCULAR | Status: AC
  Administered 2022-05-27: 10 mL via INTRADERMAL

## 2022-05-28 ENCOUNTER — Telehealth: Payer: Self-pay | Admitting: *Deleted

## 2022-05-28 NOTE — Patient Outreach (Signed)
  Care Coordination   Follow Up Visit Note   05/28/2022 Name: [name] MRN: [id number] DOB: 1945/10/29  [name] is a 76 y.o. year old female who sees Perrin Maltese, MD for primary care. I spoke with  [name] by phone today.  What matters to the patients health and wellness today?  In home care services    Goals Addressed             This Visit's Progress    In home care needs       Care Coordination Interventions: Follow up phone call from patient to follow up on aid for personal care services Patient states that she received a denial letter for personal care services and she also has paper work for the CAP program, she was confused in regards to what was approved and/or pending Phone call to [employer] for follow up, however due to long wait time message was left for a return call.  This social worker to continue to follow up with patient regarding referral for CAPS services Next appointment scheduled for 05/31/22           SDOH assessments and interventions completed:  No     Care Coordination Interventions:  Yes, provided   Follow up plan: Follow up call scheduled for 05/31/22    Encounter Outcome:  Pt. Visit Completed

## 2022-05-28 NOTE — Patient Instructions (Signed)
Visit Information  Thank you for taking time to visit with me today. Please don't hesitate to contact me if I can be of assistance to you.   Following are the goals we discussed today:   Goals Addressed             This Visit's Progress    In home care needs       Care Coordination Interventions: Follow up phone call from patient to follow up on aid for personal care services Patient states that she received a denial letter for personal care services and she also has paper work for the CAP program, she was confused in regards to what was approved and/or pending Phone call to [employer] for follow up, however due to long wait time message was left for a return call.  This social worker to continue to follow up with patient regarding referral for CAPS services Next appointment scheduled for 05/31/22           Our next appointment is by telephone on 05/31/22 at 1pm  Please call the care guide team at [phone] if you need to cancel or reschedule your appointment.   If you are experiencing a Mental Health or [place] or need someone to talk to, please call 911   Patient verbalizes understanding of instructions and care plan provided today and agrees to view in [place]. Active MyChart status and patient understanding of how to access instructions and care plan via MyChart confirmed with patient.     Telephone follow up appointment with care management team member scheduled for:05/31/22  Elliot Gurney, [place] Worker  [facility] Care Management [phone]

## 2022-05-31 ENCOUNTER — Ambulatory Visit: Payer: Self-pay | Admitting: *Deleted

## 2022-05-31 LAB — BODY FLUID CULTURE W GRAM STAIN
Culture: NO GROWTH
Special Requests: NORMAL

## 2022-05-31 NOTE — Patient Instructions (Signed)
Visit Information  Thank you for taking time to visit with me today. Please don't hesitate to contact me if I can be of assistance to you.   Following are the goals we discussed today:   Goals Addressed             This Visit's Progress    In home care needs       Care Coordination Interventions: Follow up phone call from patient to follow up on aid for personal care services Patient states that she received a denial letter for personal care services and she also has paper work for the CAP program, she was confused in regards to what was approved and/or pending [place] phone call to [employer] for follow up, patient issued  a technical denial due to the level of care not not being recommended This social worker to continue to follow up with patient regarding referral for CAPS  This social worker will plan to follow up with patient face to face on 06/02/22 to review paperwork            Our next appointment is by telephone on 03/03/22 at 9am  Please call the care guide team at [phone] if you need to cancel or reschedule your appointment.   If you are experiencing a Mental Health or [place] or need someone to talk to, please call 911   Patient verbalizes understanding of instructions and care plan provided today and agrees to view in [place]. Active MyChart status and patient understanding of how to access instructions and care plan via MyChart confirmed with patient.     Telephone follow up appointment with care management team member scheduled for: 06/02/22  Elliot Gurney, [place] Worker  [facility] Care Management [phone]

## 2022-05-31 NOTE — Patient Outreach (Signed)
  Care Coordination   Follow Up Visit Note   05/31/2022 Name: SHAGUN WORDELL MRN: [id number] DOB: 04/09/1946  [name] is a 76 y.o. year old female who sees Perrin Maltese, MD for primary care. I spoke with  [name] by phone today.  What matters to the patients health and wellness today?  CAPS    Goals Addressed             This Visit's Progress    In home care needs       Care Coordination Interventions: Follow up phone call from patient to follow up on aid for personal care services Patient states that she received a denial letter for personal care services and she also has paper work for the CAP program, she was confused in regards to what was approved and/or pending [place] phone call to [employer] for follow up, patient issued  a technical denial due to the level of care not not being recommended This social worker to continue to follow up with patient regarding referral for CAPS  This social worker will plan to follow up with patient face to face on 06/02/22 to review paperwork            SDOH assessments and interventions completed:  No     Care Coordination Interventions:  Yes, provided   Follow up plan: Follow up call scheduled for 06/02/22    Encounter Outcome:  Pt. Visit Completed

## 2022-06-02 ENCOUNTER — Ambulatory Visit: Payer: Self-pay | Admitting: *Deleted

## 2022-06-03 DIAGNOSIS — R5383 Other fatigue: Secondary | ICD-10-CM | POA: Diagnosis not present

## 2022-06-03 DIAGNOSIS — E1122 Type 2 diabetes mellitus with diabetic chronic kidney disease: Secondary | ICD-10-CM | POA: Diagnosis not present

## 2022-06-03 DIAGNOSIS — I2510 Atherosclerotic heart disease of native coronary artery without angina pectoris: Secondary | ICD-10-CM | POA: Diagnosis not present

## 2022-06-03 DIAGNOSIS — N184 Chronic kidney disease, stage 4 (severe): Secondary | ICD-10-CM | POA: Diagnosis not present

## 2022-06-03 DIAGNOSIS — E782 Mixed hyperlipidemia: Secondary | ICD-10-CM | POA: Diagnosis not present

## 2022-06-03 DIAGNOSIS — I10 Essential (primary) hypertension: Secondary | ICD-10-CM | POA: Diagnosis not present

## 2022-06-03 NOTE — Patient Outreach (Signed)
  Care Coordination   Follow Up Visit Note   06/03/2022 Name: [name] MRN: [id number] DOB: 07/17/1945  [name] is a 76 y.o. year old female who sees Perrin Maltese, MD for primary care. I engaged with [name] in the providers office today.  What matters to the patients health and wellness today?  In home care    Goals Addressed             This Visit's Progress    In home care needs       Care Coordination Interventions: Face to Face visit with patient at the provider's office to follow up on paperwork for CAPS Previous phone call to  [employer] confirming that  patient wasissued  a technical denial due to the level of care not not being recommended This social worker left paper work with nurse with the provider's office requesting need for level of care status to be reviewed. Patient's provider will be in tomorrow. Patient has a follow up appointment with the provider on 06/03/22 and will discuss changes to the form at that time This social worker will plan to follow up with patient following her appointment with her provider to review paperwork            SDOH assessments and interventions completed:  No     Care Coordination Interventions:  Yes, provided   Follow up plan: Follow up call scheduled for 06/04/22    Encounter Outcome:  Pt. Visit Completed

## 2022-06-03 NOTE — Patient Instructions (Signed)
Visit Information  Thank you for taking time to visit with me today. Please don't hesitate to contact me if I can be of assistance to you.   Following are the goals we discussed today:   Goals Addressed             This Visit's Progress    In home care needs       Care Coordination Interventions: Face to Face visit with patient at the provider's office to follow up on paperwork for CAPS Previous phone call to  [facility] confirming that  patient wasissued  a technical denial due to the level of care not not being recommended This social worker left paper work with nurse with the provider's office requesting need for level of care status to be reviewed. Patient's provider will be in tomorrow. Patient has a follow up appointment with the provider on 06/03/22 and will discuss changes to the form at that time This social worker will plan to follow up with patient following her appointment with her provider to review paperwork            Our next appointment is by telephone on 06/04/22 at 10am  Please call the care guide team at [phone] if you need to cancel or reschedule your appointment.   If you are experiencing a Mental Health or [place] or need someone to talk to, please call 911   Patient verbalizes understanding of instructions and care plan provided today and agrees to view in [place]. Active MyChart status and patient understanding of how to access instructions and care plan via MyChart confirmed with patient.     Telephone follow up appointment with care management team member scheduled for: 06/04/22  Elliot Gurney, [place] Worker  [facility] Care Management [phone]

## 2022-06-04 ENCOUNTER — Ambulatory Visit: Payer: Self-pay | Admitting: *Deleted

## 2022-06-04 NOTE — Patient Outreach (Signed)
  Care Coordination   Initial Visit Note   06/04/2022 Name: [name] MRN: [id number] DOB: 12-30-1945  [name] is a 76 y.o. year old female who sees Perrin Maltese, MD for primary care. I spoke with  [name] by phone today.  What matters to the patients health and wellness today?  In home care    Goals Addressed             This Visit's Progress    In home care needs       Care Coordination Interventions: Follow up with patient regarding  paperwork for CAPS Patient confirmed having an appointment with her provider's office yesterday and the paperwork has not been completed yet-this social worker to follow up with Denyse Amass on Monday This social worker will plan to follow up with provider's office on 06/07/22 regarding status of paperwork            SDOH assessments and interventions completed:  No     Care Coordination Interventions:  Yes, provided   Follow up plan: Follow up call scheduled for 06/07/22    Encounter Outcome:  Pt. Visit Completed

## 2022-06-04 NOTE — Patient Instructions (Signed)
Visit Information  Thank you for taking time to visit with me today. Please don't hesitate to contact me if I can be of assistance to you.   Following are the goals we discussed today:   Goals Addressed             This Visit's Progress    In home care needs       Care Coordination Interventions: Follow up with patient regarding  paperwork for CAPS Patient confirmed having an appointment with her provider's office yesterday and the paperwork has not been completed yet-this social worker to follow up with Denyse Amass on Monday This social worker will plan to follow up with provider's office on 06/07/22 regarding status of paperwork            Our next appointment is by telephone on 06/07/22 at 9:30am  Please call the care guide team at [phone] if you need to cancel or reschedule your appointment.   If you are experiencing a Mental Health or [place] or need someone to talk to, please call 911   Patient verbalizes understanding of instructions and care plan provided today and agrees to view in [place]. Active MyChart status and patient understanding of how to access instructions and care plan via MyChart confirmed with patient.     Telephone follow up appointment with care management team member scheduled for: 06/07/22  Elliot Gurney, [place] Worker  [facility] Care Management [phone]

## 2022-06-07 ENCOUNTER — Encounter: Payer: Medicare Other | Admitting: *Deleted

## 2022-06-07 ENCOUNTER — Encounter: Payer: Self-pay | Admitting: *Deleted

## 2022-06-08 DIAGNOSIS — R29890 Loss of height: Secondary | ICD-10-CM | POA: Diagnosis not present

## 2022-06-08 DIAGNOSIS — Z79899 Other long term (current) drug therapy: Secondary | ICD-10-CM | POA: Diagnosis not present

## 2022-06-08 DIAGNOSIS — M8589 Other specified disorders of bone density and structure, multiple sites: Secondary | ICD-10-CM | POA: Diagnosis not present

## 2022-06-09 ENCOUNTER — Ambulatory Visit: Payer: Self-pay | Admitting: *Deleted

## 2022-06-09 NOTE — Patient Outreach (Signed)
  Care Coordination   Follow Up Visit Note   06/09/2022 Name: [name] MRN: [id number] DOB: 04/28/1946  [name] is a 76 y.o. year old female who sees Perrin Maltese, MD for primary care. I spoke with  [name] by phone today.  What matters to the patients health and wellness today?  CAPS services    Goals Addressed             This Visit's Progress    In home care needs       Care Coordination Interventions: Follow up with patient regarding  paperwork for CAPS Patient confirmed having an appointment with her provider's office yesterday and the paperwork has not been completed yet-this social worker contacted patient's provider's office today, however the office was closed, this social worker will call on 06/10/22 to follow up           SDOH assessments and interventions completed:  No     Care Coordination Interventions:  Yes, provided   Follow up plan: Follow up call scheduled for 06/10/22    Encounter Outcome:  Pt. Visit Completed

## 2022-06-09 NOTE — Patient Instructions (Signed)
Visit Information  Thank you for taking time to visit with me today. Please don't hesitate to contact me if I can be of assistance to you.   Following are the goals we discussed today:   Goals Addressed             This Visit's Progress    In home care needs       Care Coordination Interventions: Follow up with patient regarding  paperwork for CAPS Patient confirmed having an appointment with her provider's officeon 06/03/22  and the paperwork has not been completed yet-this social worker contacted patient's provider's office today, however the office was closed, this social worker will call on 06/10/22 to follow up           [place] next appointment is by telephone on 06/23/21 at 11am  Please call the care guide team at [phone] if you need to cancel or reschedule your appointment.   If you are experiencing a Mental Health or [place] or need someone to talk to, please call 911   Patient verbalizes understanding of instructions and care plan provided today and agrees to view in [place]. Active MyChart status and patient understanding of how to access instructions and care plan via MyChart confirmed with patient.     Telephone follow up appointment with care management team member scheduled for: 06/11/22  Elliot Gurney, [place] Worker  [facility] Care Management [phone]

## 2022-06-11 ENCOUNTER — Encounter: Payer: Self-pay | Admitting: *Deleted

## 2022-06-16 ENCOUNTER — Telehealth: Payer: Self-pay | Admitting: *Deleted

## 2022-06-16 DIAGNOSIS — Z79899 Other long term (current) drug therapy: Secondary | ICD-10-CM | POA: Diagnosis not present

## 2022-06-16 NOTE — Patient Outreach (Signed)
  Care Coordination   Follow Up Visit Note   06/16/2022 Name: [name] MRN: [id number] DOB: 05/13/1946  [name] is a 76 y.o. year old female who sees Perrin Maltese, MD for primary care. I spoke with  [name] by phone today.  What matters to the patients health and wellness today?  CAPS services    Goals Addressed             This Visit's Progress    In home care needs       Care Coordination Interventions: Follow up phone call from patient regarding  paperwork for CAPS This social worker contacted patient's provider's office today to follow up on paperwork, however the [place] had already left for the day, this social worker will call on 06/17/22 to follow up           SDOH assessments and interventions completed:  No     Care Coordination Interventions:  Yes, provided   Follow up plan: Follow up call scheduled for 06/17/22    Encounter Outcome:  Pt. Visit Completed

## 2022-06-16 NOTE — Patient Instructions (Signed)
Visit Information  Thank you for taking time to visit with me today. Please don't hesitate to contact me if I can be of assistance to you.   Following are the goals we discussed today:   Goals Addressed             This Visit's Progress    In home care needs       Care Coordination Interventions: Follow up phone call from patient regarding  paperwork for CAPS This social worker contacted patient's provider's office today to follow up on paperwork, however the [place] had already left for the day, this social worker will call on 06/17/22 to follow up           [place] next appointment is by telephone on 06/17/22 at 11am  Please call the care guide team at [phone] if you need to cancel or reschedule your appointment.   If you are experiencing a Mental Health or [place] or need someone to talk to, please call 911   Patient verbalizes understanding of instructions and care plan provided today and agrees to view in [place]. Active MyChart status and patient understanding of how to access instructions and care plan via MyChart confirmed with patient.     Telephone follow up appointment with care management team member scheduled for: 06/16/22  Elliot Gurney, [place] Worker  [facility] Care Management [phone]

## 2022-06-17 ENCOUNTER — Encounter: Payer: Self-pay | Admitting: *Deleted

## 2022-06-17 ENCOUNTER — Telehealth: Payer: Self-pay | Admitting: *Deleted

## 2022-06-17 NOTE — Patient Outreach (Signed)
Phone call to patient's provider's office to follow up on CAPS paperwork left for review. VM left for a return call.   Elliot Gurney, [place] Worker  [facility] Care Management [phone]

## 2022-06-18 ENCOUNTER — Encounter: Payer: Self-pay | Admitting: *Deleted

## 2022-06-23 ENCOUNTER — Ambulatory Visit: Payer: Self-pay | Admitting: *Deleted

## 2022-06-23 NOTE — Patient Outreach (Signed)
  Care Coordination   Follow Up Visit Note   06/23/2022 Name: FRANCA STAKES MRN: [id number] DOB: [birth year]  [name] is a 77 y.o. year old female who sees Perrin Maltese, MD for primary care. I spoke with  [name] by phone today.  What matters to the patients health and wellness today?  In home care    Goals Addressed             This Visit's Progress    In home care needs       Care Coordination Interventions: Follow up phone call from patient regarding  paperwork for CAPS This social worker contacted patient's provider's office today to follow up on paperwork, left voicemail message with [facility] requesting a return call Follow up phone call to patient who continues to confirm should pain and in need of in home assistance Patient confirms that she has an appointment today at 2pm with her providers office and will follow up with the paperwork at that time Patient confirms continued interest in the CAPS program and declines follow up with personal care services through [place] due to inconsistency with the care they provided when used in the past           SDOH assessments and interventions completed:  No     Care Coordination Interventions:  Yes, provided   Follow up plan: Follow up call scheduled for 07/07/22    Encounter Outcome:  Pt. Visit Completed

## 2022-06-23 NOTE — Patient Instructions (Signed)
Visit Information  Thank you for taking time to visit with me today. Please don't hesitate to contact me if I can be of assistance to you.   Following are the goals we discussed today:   Goals Addressed             This Visit's Progress    In home care needs       Care Coordination Interventions: Follow up phone call from patient regarding  paperwork for CAPS This social worker contacted patient's provider's office today to follow up on paperwork, left voicemail message with [facility] requesting a return call Follow up phone call to patient who continues to confirm should pain and in need of in home assistance Patient confirms that she has an appointment today at 2pm with her providers office and will follow up with the paperwork at that time Patient confirms continued interest in the [place] program and declines follow up with personal care services through [place] due to inconsistency with the care they provided when used in the past           Our next appointment is by telephone on 07/07/22 at 2pm  Please call the care guide team at [phone] if you need to cancel or reschedule your appointment.   If you are experiencing a Mental Health or [place] or need someone to talk to, please call 911   Patient verbalizes understanding of instructions and care plan provided today and agrees to view in [place]. Active MyChart status and patient understanding of how to access instructions and care plan via MyChart confirmed with patient.     Telephone follow up appointment with care management team member scheduled for: 07/07/22  Elliot Gurney, [place] Worker  [facility] Care Management [phone]

## 2022-06-29 ENCOUNTER — Ambulatory Visit: Payer: Medicare Other | Admitting: Neurosurgery

## 2022-06-30 ENCOUNTER — Encounter: Payer: Self-pay | Admitting: Neurosurgery

## 2022-06-30 ENCOUNTER — Ambulatory Visit (INDEPENDENT_AMBULATORY_CARE_PROVIDER_SITE_OTHER): Payer: Medicare Other | Admitting: Neurosurgery

## 2022-06-30 VITALS — BP 138/86 | Ht 63.0 in | Wt 178.6 lb

## 2022-06-30 DIAGNOSIS — M4316 Spondylolisthesis, lumbar region: Secondary | ICD-10-CM | POA: Diagnosis not present

## 2022-06-30 DIAGNOSIS — Z981 Arthrodesis status: Secondary | ICD-10-CM

## 2022-06-30 DIAGNOSIS — M5136 Other intervertebral disc degeneration, lumbar region: Secondary | ICD-10-CM | POA: Diagnosis not present

## 2022-06-30 MED ORDER — METHOCARBAMOL 500 MG PO TABS: 500.0000 mg | ORAL_TABLET | Freq: Four times a day (QID) | ORAL | 0 refills | Status: DC | PRN

## 2022-06-30 NOTE — Progress Notes (Signed)
Referring Physician:  No referring provider defined for this encounter.  Primary Physician:  Perrin Maltese, MD  History of Present Illness: 06/30/2022 [name] returns to see me.  She continues to have symptoms as noted below.  She will start physical therapy tomorrow.  04/29/2022 [name] is here today with a chief complaint of low back pain that radiates in the bilateral hips and legs, right greater than left. Occasionally she will have numbness and tingling in the legs.  She mostly has pain in the right leg.  It has been going on for several months.  She reports pain as bad as 10 out of 10 with standing, walking, bending, lifting, and squatting.   Bowel/Bladder Dysfunction: none  Conservative measures:  Physical therapy: has not participated in Multimodal medical therapy including regular antiinflammatories: gabapentin, prednisone, tizanidine, tylenol Injections: has not received epidural steroid injections  Past Surgery:  Lumbar Surgery around 2008-2009 Cervical Surgery Brain Surgery for Aneurysm in 2013  [name] has no symptoms of cervical myelopathy.  The symptoms are causing a significant impact on the patient's life.   Review of Systems:  A 10 point review of systems is negative, except for the pertinent positives and negatives detailed in the HPI.  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 b.)07/18/2006 -endovas oblit complex ACOM aneur. c.) Endovas Tx of 6.8x4mm106mCA aneur. d.) 3.5x3mm 81mnant RMCA aneur 2/2 coil compaction. e.) Interval 3.2x3.1mm s59mular outpouching in ACOM c/w mild recannulization in neck. 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 g.) 3.7x2mm re68mnt of prev Tx'd ACOM aneur -failed embol 02/24/2012.   CKD (chronic kidney disease), stage IV (HCC)     a.) solitary functioning kidney on the RIGHT   Complication of anesthesia    a.) delayed emergence   Coronary artery disease 02/23/2007   a.) LHC 02/23/2007 --> EF 50%; 30% pLAD, 70% mRCA --> planned for staged PCI. b.) PCI 02/27/2007: EF 60%; 3.5 x 15 mm Vision BMS to 80% mRCA. b.) LHC 07/24/2007: EF 60%; minor irregs; no occlusive CAD; no intervention. c.) LHC 10/28/2010: EF 60%; 30% mLAD, LCx with minor luminal irregs, 20% ISR mRCA; no intervention. d.) LHC 05/31/2013: EF 60%; 40% mLAD, 30% ISR m-dRCA; no interventions.   DDD (degenerative disc disease), cervical    a.) s/p ACDF C5-C7; hardware in neck; patient appreciates stiffness and issues with mobility   Diastolic dysfunction    a.)  TTE 07/07/2021: EF 87.8%; normal PASP; trace TR/MR; G1DD.   Elevated hemidiaphragm    a.) LEFT   Headache(784.0)    Heart murmur    HLD (hyperlipidemia)    HOH (hard of hearing)    Has hearing aids, doesn't wear   Hyperparathyroidism due to renal insufficiency (HCC)    Hypertension    Incomplete right bundle branch block (RBBB)    Insomnia    a.) takes zolpidem   Long term (current) use of immunomodulator    a.) on DMARD therapy (hydroxychloriquine) for RA/SLE Dx.   Long term current use of antithrombotics/antiplatelets    a.) clopidogrel   Multiple acquired cysts of kidney    OSA (obstructive sleep apnea)    a.) does NOT use nocturnal pap therapy   Pancreatic cyst    Pneumonia    RBBB (right bundle branch block)  Rheumatoid arthritis ([place])    a.) on DMARD; hydroxychloriquine   Systemic lupus erythematosus ([place])    T2DM (type 2 diabetes mellitus) ([place])    Wears dentures    partial lower    Past Surgical History: Past Surgical History:  Procedure Laterality Date   ANEURYSM COILING  08/2013   Attempted embolization of previously treated ACOM aneurysm; unsuccessful N/A 01/28/2012   Location: [place] CYST MARSUPIALIZATION     BICEPT TENODESIS  Right 03/02/2022   Procedure: BICEPS TENODESIS;  Surgeon: Corky Mull, MD;  Location: [facility];  Service: Orthopedics;  Laterality: Right;   BREAST CYST ASPIRATION Right 01/25/2012   FNA neg.   BREAST EXCISIONAL BIOPSY Left 06/26/2007   neg   BUNIONECTOMY Bilateral 2022   CARDIAC CATHETERIZATION Left 02/23/2007   Procedure: CARDIAC CATHETERIZATION; Location: [facility]; Surgeon: Neoma Laming, MD   CARDIAC CATHETERIZATION Left 07/31/2007   Procedure: CARDIAC CATHETERIZATION; Location: [place]; Surgeon: Neoma Laming, MD   CARDIAC CATHETERIZATION Left 10/28/2010   Procedure: CARDIAC CATHETERIZATION; Location: [place]; Surgeon: Neoma Laming, MD   CARDIAC CATHETERIZATION Left 05/31/2013   Procedure: CARDIAC CATHETERIZATION; Location: [place]; Surgeon: Neoma Laming, MD   Carotid arteriogram; 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 N/A 07/18/2006   Location: [facility] [address]   CATARACT EXTRACTION     CERVICAL FUSION N/A    Procedure: ACDF C5-C7   COLONOSCOPY WITH PROPOFOL N/A 10/04/2019   Procedure: COLONOSCOPY WITH BIOPSY;  Surgeon: Lin Landsman, MD;  Location: [place];  Service: Endoscopy;  Laterality: N/A;  Diabetic (borderline) - oral meds priority 3   CORONARY STENT INTERVENTION Left 02/27/2007   Procedure: CORONARY STENT INTERVENTION (3.5 x 15 mm Vision BMS to mRCA); Location: [place]; Surgeon: Kathlyn Sacramento, MD   Endovascular near complete obliteration of enlarging neck remnant of previously treated RIGHT MCA aneurysm using stent assisted coiling N/A 09/03/2010   [facility]   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 N/A 10/03/2006   Location: [facility]   HYSTEROSCOPY WITH D & C N/A 08/04/2021   Procedure: DILATATION AND CURETTAGE /HYSTEROSCOPY;  Surgeon: Homero Fellers, MD;  Location: [facility];  Service: Gynecology;  Laterality: N/A;   POLYPECTOMY N/A 10/04/2019   Procedure:  POLYPECTOMY;  Surgeon: Lin Landsman, MD;  Location: [place];  Service: Endoscopy;  Laterality: N/A;   REVERSE SHOULDER ARTHROPLASTY Right 03/02/2022   Procedure: REVERSE SHOULDER ARTHROPLASTY;  Surgeon: Corky Mull, MD;  Location: [facility];  Service: Orthopedics;  Laterality: Right;   UPPER ESOPHAGEAL ENDOSCOPIC ULTRASOUND (EUS) N/A 04/15/2016   Procedure: UPPER ESOPHAGEAL ENDOSCOPIC ULTRASOUND (EUS);  Surgeon: Holly Bodily, MD;  Location: [facility] END[facility];  Service: Gastroenterology;  Laterality: N/A;    Allergies: Allergies as of 06/30/2022 - Review Complete 06/30/2022  Allergen Reaction Noted   Isosorbide dinitrate Other (See Comments) 07/15/2017   Amlodipine Swelling 07/23/2011   Sulfa antibiotics Itching 01/13/2012   Oxycodone Itching 04/14/2021   Sulfasalazine Itching 01/13/2012    Medications: Current Meds  Medication Sig   acetaminophen (TYLENOL) 500 MG tablet Take 2 tablets (1,000 mg total) by mouth every 6 (six) hours as needed.   allopurinol (ZYLOPRIM) 100 MG tablet Take 100 mg by mouth every morning.   clopidogrel (PLAVIX) 75 MG tablet Take 75 mg by mouth daily.   clotrimazole-betamethasone (LOTRISONE) cream Apply 1 application topically 2 (two) times daily as needed for rash.   furosemide (LASIX)  40 MG tablet Take 40 mg by mouth every morning.   gabapentin (NEURONTIN) 100 MG capsule Take 100 mg by mouth at bedtime as needed (pain).   glimepiride (AMARYL) 2 MG tablet Take 1-2 mg by mouth See admin instructions. Take 1 mg daily, may increase to 2 mg if blood sugar is over 175   hydrALAZINE (APRESOLINE) 100 MG tablet Take 100 mg by mouth daily.   hydroxychloroquine (PLAQUENIL) 200 MG tablet Take 200 mg by mouth daily.   labetalol (NORMODYNE) 300 MG tablet Take 300 mg by mouth 2 (two) times daily.   losartan (COZAAR) 100 MG tablet Take 100 mg by mouth every morning.   ondansetron (ZOFRAN) 4 MG tablet Take 1 tablet (4 mg total) by mouth every 6 (six)  hours as needed for nausea.   rosuvastatin (CRESTOR) 40 MG tablet Take 40 mg by mouth every morning. Patient reports that she is taking this daily   SYMBICORT 80-4.5 MCG/ACT inhaler Inhale 1 puff into the lungs 2 (two) times daily as needed (shortness of breath).   traMADol (ULTRAM) 50 MG tablet Take 1 tablet (50 mg total) by mouth every 6 (six) hours as needed for moderate pain or severe pain.   tretinoin (RETIN-A) 0.025 % cream Apply 1 application topically daily as needed (acne).   zolpidem (AMBIEN) 5 MG tablet Take 5 mg by mouth at bedtime.    Social History: Social History   Tobacco Use   Smoking status: Never   Smokeless tobacco: Never  Vaping Use   Vaping Use: Never used  Substance Use Topics   Alcohol use: Yes    Comment: Occasionally at christmas   Drug use: No    Family Medical History: Family History  Problem Relation Age of Onset   Breast cancer Paternal Aunt 46   Breast cancer Paternal 5    Heart disease Mother    Heart disease Father    Stroke Father    Diabetes Sister    Diabetes Brother     Physical Examination: Vitals:   06/30/22 1446  BP: 138/86    General: Patient is well developed, well nourished, calm, collected, and in no apparent distress. Attention to examination is appropriate.  Neck:   Supple.  Full range of motion.  Respiratory: Patient is breathing without any difficulty.   NEUROLOGICAL:     Awake, alert, oriented to person, place, and time.  Speech is clear and fluent. Fund of knowledge is appropriate.   Cranial Nerves: Pupils equal round and reactive to light.  Facial tone is symmetric.  Facial sensation is symmetric. Shoulder shrug is symmetric. Tongue protrusion is midline.  There is no pronator drift.  ROM of spine: full.    Strength: Side Biceps Triceps Deltoid Interossei Grip Wrist Ext. Wrist Flex.  R '5 5 5 5 5 5 5  '$ L '5 5 5 5 5 5 5   '$ Side Iliopsoas Quads Hamstring PF DF EHL  R '5 5 5 5 5 5  '$ L '5 5 5 5 5 5   '$ Reflexes  are 1+ and symmetric at the biceps, triceps, brachioradialis, patella and achilles.   Hoffman's is absent.   Bilateral upper and lower extremity sensation is intact to light touch.    No evidence of dysmetria noted.  Gait is abnormal and requires a cane.     Medical Decision Making  Imaging: MRI L spine 10/02/2021 T12-L1: Disc bulge, new from the prior MRI. Progressive mild facet arthrosis and ligamentum flavum hypertrophy. Small volume fluid  within the bilateral facet joints. No significant spinal canal or foraminal stenosis.   L1-L2: Progressive disc bulge. Progressive disc bulge. New superimposed small central disc extrusion with slight cranial and caudal migration. A small right subarticular/foraminal disc protrusion is also new. Progressive facet arthrosis (mild right, moderate left) and ligamentum flavum hypertrophy. There is now mild bilateral subarticular narrowing at this level (without appreciable nerve root impingement). Mild-to-moderate right neural foraminal narrowing is new from the prior MRI.   L2-L3: Small disc bulge asymmetric to the left. Facet arthrosis (moderate right, moderate/advanced left) with ligamentum flavum hypertrophy, progressed. There is now mild left greater than right subarticular narrowing at this level (without appreciable nerve root impingement). Mild narrowing of the central canal is new from the prior MRI. Mild relative left neural foraminal narrowing, also new.   L3-L4: Small central zone posterior annular fissure, new from the prior MRI. Disc bulge, also new. Advanced facet arthrosis with ligamentum flavum hypertrophy, progressed. Bilateral facet joint effusions, new from the prior MRI. Severe central canal and left subarticular stenosis, new from the prior MRI. Moderate right subarticular stenosis is also new. There is now bilateral neural foraminal narrowing at this level (moderate right, mild left).   L4-L5: Interval posterior  decompression and posterior spinal fusion. 3 mm fused grade 1 anterolisthesis. Endplate osteophytes. Facet arthrosis and ligamentum flavum hypertrophy. Mild-to-moderate spinal canal narrowing at the level of the L4 inferior endplate, improved from the prior MRI. Endplate osteophytes contribute to moderate right neural foraminal narrowing. No appreciable left foraminal stenosis.   L5-S1: 3 mm grade 1 anterolisthesis. Disc uncovering. Central to left foraminal zone posterior annular fissure. Facet arthrosis (moderate right, moderate/advanced left). Slight ligamentum flavum hypertrophy on the left. Moderate left subarticular stenosis with crowding of the descending left S1 nerve root (series 10, image 31). Mild right subarticular narrowing (without appreciable nerve root impingement). Central canal patent. Mild relative left neural foraminal narrowing.   IMPRESSION: Since the prior lumbar spine MRI 02/26/2006, there has been interval posterior decompression and posterior spinal fusion at L4-L5. Multifactorial mild-to-moderate spinal canal narrowing at the level of the L4 inferior endplate has improved. Endplate osteophytes contribute to moderate right neural foraminal narrowing at this level.   Additional lumbar and lower thoracic spondylosis, as outlined and having progressed at multiple levels since the prior MRI of 02/26/2006. Findings are most notably as follows.   At L3-L4, there is progressive multifactorial severe central canal and left subarticular stenosis, and progressive moderate right subarticular stenosis. Progressive moderate bilateral neural foraminal narrowing at this level. Also of note, facet arthrosis is advanced at this level and bilateral facet joint effusions are present.   No more than mild relative narrowing of the central canal at the remaining levels. Additional sites of subarticular stenosis, as detailed and greatest on the left at L5-S1 (moderate at this  site with crowding of the descending left S1 nerve root).   Progressive advanced disc degeneration at L1-L2.     Electronically Signed   By: Kellie Simmering D.O.   On: 10/02/2021 09:20  I have personally reviewed the images and agree with the above interpretation.  Assessment and Plan: [name] is a pleasant 77 y.o. 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  I think she is likely to need surgical intervention, but we will complete her conservative management with physical therapy before moving forward.  I will see her back in approximately 6 weeks.  If she is not better at  that time, we will consider L3-4 lateral lumbar interbody fusion with extension of her fusion to L3 posteriorly.   I spent a total of 10 minutes in face-to-face and non-face-to-face activities related to this patient's care today.  Thank you for involving me in the care of this patient.      [name] K. Izora Ribas MD, [facility] Neurosurgery

## 2022-07-01 ENCOUNTER — Ambulatory Visit: Payer: 59 | Attending: Neurosurgery | Admitting: Physical Therapy

## 2022-07-01 DIAGNOSIS — M4316 Spondylolisthesis, lumbar region: Secondary | ICD-10-CM | POA: Diagnosis not present

## 2022-07-01 DIAGNOSIS — G8929 Other chronic pain: Secondary | ICD-10-CM | POA: Diagnosis not present

## 2022-07-01 DIAGNOSIS — Z981 Arthrodesis status: Secondary | ICD-10-CM | POA: Diagnosis not present

## 2022-07-01 DIAGNOSIS — M25551 Pain in right hip: Secondary | ICD-10-CM | POA: Diagnosis not present

## 2022-07-01 DIAGNOSIS — M5136 Other intervertebral disc degeneration, lumbar region: Secondary | ICD-10-CM | POA: Diagnosis not present

## 2022-07-01 DIAGNOSIS — Z794 Long term (current) use of insulin: Secondary | ICD-10-CM | POA: Diagnosis not present

## 2022-07-01 DIAGNOSIS — E119 Type 2 diabetes mellitus without complications: Secondary | ICD-10-CM | POA: Diagnosis not present

## 2022-07-01 DIAGNOSIS — M545 Low back pain, unspecified: Secondary | ICD-10-CM | POA: Diagnosis not present

## 2022-07-01 NOTE — Therapy (Signed)
OUTPATIENT PHYSICAL THERAPY THORACOLUMBAR EVALUATION   Patient Name: [name] MRN: [id number] DOB:1945/09/12, 77 y.o., female Today's Date: 07/05/2022  END OF SESSION:  PT End of Session - 07/05/22 1317     Visit Number 1    Number of Visits 17    Date for PT Re-Evaluation 08/27/22    Authorization - Visit Number 1    Progress Note Due on Visit 10    PT Start Time 1130    PT Stop Time 1213    PT Time Calculation (min) 43 min    Activity Tolerance Patient tolerated treatment well    Behavior During Therapy [facility] for tasks assessed/performed              Past Medical History:  Diagnosis Date   Anemia of chronic renal disease    Arthritis    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 b.)07/18/2006 -endovas oblit complex ACOM aneur. c.) Endovas Tx of 6.8x4mm63mCA aneur. d.) 3.5x3mm 62mnant RMCA aneur 2/2 coil compaction. e.) Interval 3.2x3.1mm s56mular outpouching in ACOM c/w mild recannulization in neck. 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 g.) 3.7x2mm re46mnt of prev Tx'd ACOM aneur -failed embol 02/24/2012.   CKD (chronic kidney disease), stage IV (HCC)    a.) solitary functioning kidney on the RIGHT   Complication of anesthesia    a.) delayed emergence   Coronary artery disease 02/23/2007   a.) LHC 02/23/2007 --> EF 50%; 30% pLAD, 70% mRCA --> planned for staged PCI. b.) PCI 02/27/2007: EF 60%; 3.5 x 15 mm Vision BMS to 80% mRCA. b.) LHC 07/24/2007: EF 60%; minor irregs; no occlusive CAD; no intervention. c.) LHC 10/28/2010: EF 60%; 30% mLAD, LCx with minor luminal irregs, 20% ISR mRCA; no intervention. d.) LHC 05/31/2013: EF 60%; 40% mLAD, 30% ISR m-dRCA; no interventions.   DDD (degenerative disc disease), cervical    a.) s/p ACDF C5-C7; hardware in neck; patient appreciates stiffness and issues with mobility   Diastolic dysfunction    a.)  TTE 07/07/2021: EF 87.8%; normal PASP; trace TR/MR; G1DD.    Elevated hemidiaphragm    a.) LEFT   Headache(784.0)    Heart murmur    HLD (hyperlipidemia)    HOH (hard of hearing)    Has hearing aids, doesn't wear   Hyperparathyroidism due to renal insufficiency (HCC)    Hypertension    Incomplete right bundle branch block (RBBB)    Insomnia    a.) takes zolpidem   Long term (current) use of immunomodulator    a.) on DMARD therapy (hydroxychloriquine) for RA/SLE Dx.   Long term current use of antithrombotics/antiplatelets    a.) clopidogrel   Multiple acquired cysts of kidney    OSA (obstructive sleep apnea)    a.) does NOT use nocturnal pap therapy   Pancreatic cyst    Pneumonia    RBBB (right bundle branch block)    Rheumatoid arthritis (HCC)   [place]) on DMARD; hydroxychloriquine   Systemic lupus erythematosus (HCC)   [place] (type 2 diabetes mellitus) (HCC)   [place] dentures    partial lower   Past Surgical History:  Procedure Laterality Date   ANEURYSM COILING  08/2013   Attempted embolization of previously treated ACOM aneurysm; unsuccessful N/A 01/28/2012   [facility] [place] [facility]     BICEPT TENODESIS Right 03/02/2022   Procedure: BICEPS TENODESIS;  Surgeon: Corky Mull, MD;  Location: [facility];  Service: Orthopedics;  Laterality: Right;   BREAST CYST ASPIRATION Right 01/25/2012   FNA neg.   BREAST EXCISIONAL BIOPSY Left 06/26/2007   neg   BUNIONECTOMY Bilateral 2022   CARDIAC CATHETERIZATION Left 02/23/2007   Procedure: CARDIAC CATHETERIZATION; Location: [place]; Surgeon: Neoma Laming, MD   CARDIAC CATHETERIZATION Left 07/31/2007   Procedure: CARDIAC CATHETERIZATION; Location: [place]; Surgeon: Neoma Laming, MD   CARDIAC CATHETERIZATION Left 10/28/2010   Procedure: CARDIAC CATHETERIZATION; Location: [place]; Surgeon: Neoma Laming, MD   CARDIAC CATHETERIZATION Left 05/31/2013   Procedure: CARDIAC CATHETERIZATION; Location: [place]; Surgeon: Neoma Laming, MD   Carotid arteriogram;  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 N/A 07/18/2006   Location[facility]   CATARACT EXTRACTION     CERVICAL FUSION N/A    Procedure: ACDF C5-C7   COLONOSCOPY WITH PROPOFOL N/A 10/04/2019   Procedure: COLONOSCOPY WITH BIOPSY;  Surgeon: Lin Landsman, MD;  Location: [place];  Service: Endoscopy;  Laterality: N/A;  Diabetic (borderline) - oral meds priority 3   CORONARY STENT INTERVENTION Left 02/27/2007   Procedure: CORONARY STENT INTERVENTION (3.5 x 15 mm Vision BMS to mRCA); Location: [place]; Surgeon: Kathlyn Sacramento, MD   Endovascular near complete obliteration of enlarging neck remnant of previously treated RIGHT MCA aneurysm using stent assisted coiling N/A 09/03/2010   Location: [facility]   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 N/A 10/03/2006   Location: [facility]   HYSTEROSCOPY WITH D & C N/A 08/04/2021   Procedure: DILATATION AND CURETTAGE /HYSTEROSCOPY;  Surgeon: Homero Fellers, MD;  Location: [facility];  Service: Gynecology;  Laterality: N/A;   POLYPECTOMY N/A 10/04/2019   Procedure: POLYPECTOMY;  Surgeon: Lin Landsman, MD;  Location: [place];  Service: Endoscopy;  Laterality: N/A;   REVERSE SHOULDER ARTHROPLASTY Right 03/02/2022   Procedure: REVERSE SHOULDER ARTHROPLASTY;  Surgeon: Corky Mull, MD;  Location: [facility];  Service: Orthopedics;  Laterality: Right;   UPPER ESOPHAGEAL ENDOSCOPIC ULTRASOUND (EUS) N/A 04/15/2016   Procedure: UPPER ESOPHAGEAL ENDOSCOPIC ULTRASOUND (EUS);  Surgeon: Holly Bodily, MD;  [facility];  Service: Gastroenterology;  Laterality: N/A;   Patient Active Problem List   Diagnosis Date Noted   Status post reverse total shoulder replacement, 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   Thickened endometrium    Rectal bleeding    Systemic lupus erythematosus ([place]) 09/19/2019   Coronary disease 11/03/2017   Diabetes  ([place]) 11/03/2017   Gout 11/03/2017   Osteoarthritis 11/03/2017    PCP: Humphrey Rolls MD  REFERRING PROVIDER: Meade Maw MD  REFERRING DIAG: low back pain   Rationale for Evaluation and Treatment: Rehabilitation  THERAPY DIAG:  Chronic bilateral low back pain, unspecified whether sciatica present - Plan: PT plan of care cert/re-cert  ONSET DATE: Jan 2023  SUBJECTIVE:  SUBJECTIVE STATEMENT: Acute on chronic LBP  PERTINENT HISTORY:  Pt is a 76 year old female presenting with with chronic LBP with most recent flare up "about a year ago with insideous onset". PMH of posterior decompression and posterior spinal fusion at L4-L5. Pt reports bilat LE pain and n/t down BLE but the pain "takes turns". Current and best pain 5/10; worst 10/10. Has most pain in the morning when she wakes up, walking >86mns, standing >574ms (to cook), lifting, carrying, stair ascent. Pt is retired, enjoys watching TV, "because there is not very much I can do". Before her back pain flared up she enjoyed cooking, gardened, and enjoyed walking. Pt reports having SPC that she uses when she has increased pain, and adaptive bathroom in her apartment (that she lives in alone). Has had 1 fall in the past 6 months in the shower while trying to shave her legs. Does endorse feeling off balance, that her "feet do things on their own", denies neuropathy or diabetes. Pt denies N/V, B&B changes, unexplained weight fluctuation, saddle paresthesia, fever, night sweats, or unrelenting night pain at this time.   PAIN:  Are you having pain? Yes: NPRS scale: 5/10 Pain location: across low back; "random radiating pain into Les" Pain description: dull ache Aggravating factors: morning when she wakes up, walking >35m98m, lifting, carrying Relieving factors:  gabapentin, rest  PRECAUTIONS: None  WEIGHT BEARING RESTRICTIONS: No  FALLS:  Has patient fallen in last 6 months? Yes. Number of falls 1  LIVING ENVIRONMENT: Lives with: lives alone Lives in [place]: External: 15 steps; can reach both Has following equipment at home: Single point cane, RW, shower seat, grab bars in shower  OCCUPATION: retired  PLOF: Independent with basic ADLs  PATIENT GOALS: decrease pain to be able to walk again  NEXT MD VISIT:   OBJECTIVE:   DIAGNOSTIC FINDINGS:  MRI 10/02/21  Interval posterior decompression and posterior spinal fusion at L4-L5. Multifactorial mild-to-moderate spinal canal narrowing at the level of the L4 inferior endplate has improved. Endplate osteophytes contribute to moderate right neural foraminal narrowing at this level.  Additional lumbar and lower thoracic spondylosis, as outlined and having progressed at multiple levels since the prior MRI of 02/26/2006. Findings are most notably as follows.  At L3-L4, there is progressive multifactorial severe central canal and left subarticular stenosis, and progressive moderate right subarticular stenosis. Progressive moderate bilateral neural foraminal narrowing at this level. Also of note, facet arthrosis is advanced at this level and bilateral facet joint effusions are present.  No more than mild relative narrowing of the central canal at the remaining levels. Additional sites of subarticular stenosis, as detailed and greatest on the left at L5-S1 (moderate at this site with crowding of the descending left S1 nerve root).  Progressive advanced disc degeneration at L1-L2.    PATIENT SURVEYS:  FOTO 29; goal 46 55CREENING FOR RED FLAGS: Bowel or bladder incontinence: No Spinal tumors: No Cauda equina syndrome: No Compression fracture: No Abdominal aneurysm: No  COGNITION: Overall cognitive status: Within functional limits for tasks  assessed     SENSATION: WFL  MUSCLE LENGTH: Hamstrings: Right 25% limited bilat with concordant LBP Thomas test: not formally tested likely shortened due to visability seen in ambulation and standing  POSTURE: rounded shoulders, forward head, decreased lumbar lordosis, increased thoracic kyphosis, and flexed trunk   PALPATION: TTP to R superior glute max musculature and tension/trigger points to bilat lumbar paraspinals/QL with concordant pain Pain relief with L1-2 2-3 CPA  LUMBAR ROM:   AROM eval  Flexion 25% limited with R sided low back pain   Extension 50% limited with pain  Right lateral flexion WNL with L sided LBP  Left lateral flexion WNL  Right rotation WNL with L sided LBP  Left rotation WNL with thoracic pain    (Blank rows = not tested)  LOWER EXTREMITY ROM:     Active / PROM Right eval Left eval  Hip flexion 95d/120d 100/120d  Hip extension Near 0d/ ~10% limited Near 0d/ ~10% limited  Hip abduction 25% limitedWNL 25% limitedWNL  Hip adduction    Hip internal rotation WNL wnl  Hip external rotation WNL wnl  Knee flexion    Knee extension    Ankle dorsiflexion    Ankle plantarflexion    Ankle inversion    Ankle eversion     (Blank rows = not tested)  LOWER EXTREMITY MMT:    MMT Right eval Left eval  Hip flexion 2+ 3-  Hip extension    Hip abduction 3+ 3+  Hip adduction    Hip internal rotation 4 4+  Hip external rotation 4 4  Knee flexion    Knee extension    Ankle dorsiflexion    Ankle plantarflexion    Ankle inversion    Ankle eversion     (Blank rows = not tested)  LUMBAR SPECIAL TESTS:  Straight leg raise test: Negative, Slump test: Negative, and FABER test: Positive FADIR positive  FUNCTIONAL TESTS:  5 times sit to stand: 38sec 10 meter walk test: self selected: 0.65ms fastest: 0.567m  GAIT: Distance walked: 66M Assistive device utilized: None Level of assistance: Modified independence Comments: slow speed, ner shuffle/no  foot clearance, antalgic, compensated trendelenberg   TODAY'S TREATMENT:                                                                                                                              DATE: 07/01/22  PT reviewed the following HEP with patient with patient able to demonstrate a set of the following with min cuing for correction needed. PT educated patient on parameters of therex (how/when to inc/decrease intensity, frequency, rep/set range, stretch hold time, and purpose of therex) with verbalized understanding.   Exercises - Mini Squat with Counter Support  - 2 x weekly - 3 sets - 8-12 reps - Standing Hip Abduction with Counter Support  - 7 x weekly - 3 sets - 8-12 reps  PATIENT EDUCATION:  Education details: Patient was educated on diagnosis, anatomy and pathology involved, prognosis, role of PT, and was given an HEP, demonstrating exercise with proper form following verbal and tactile cues, and was given a paper hand out to continue exercise at home. Pt was educated on and agreed to plan of care.  Person educated: Patient Education method: Explanation, Demonstration, and Handouts Education comprehension: verbalized understanding, returned demonstration, and verbal cues required  HOME EXERCISE PROGRAM: Access Code: [email or web address]:  CLINICAL  IMPRESSION: Patient is a 77 y.o. female who was seen today for physical therapy evaluation and treatment for acute on chronic LBP. Impairments in abnormal posture, gait abnormalities, decreased hip and core strength, decreased lumbar and hip mobility, hypersensitivity, and increased tension/trigger point to lumbar and glute musculature. Activity limitations in all functional transfers, standing, walking, community ambulation, household chores; inhibiting participation in community and household ADLs. Would benefit from skilled PT to address above deficits and promote optimal return to PLOF.   OBJECTIVE IMPAIRMENTS: Abnormal gait,  decreased activity tolerance, decreased balance, decreased coordination, decreased endurance, decreased mobility, difficulty walking, decreased ROM, decreased strength, increased fascial restrictions, impaired flexibility, impaired tone, improper body mechanics, postural dysfunction, obesity, and pain.   ACTIVITY LIMITATIONS: carrying, lifting, bending, sitting, standing, squatting, stairs, transfers, bathing, hygiene/grooming, and locomotion level  PARTICIPATION LIMITATIONS: meal prep, cleaning, laundry, driving, community activity, and yard work  PERSONAL FACTORS: Age, Fitness, Past/current experiences, Time since onset of injury/illness/exacerbation, and 3+ comorbidities: HTN, HLD, DDD, OA, CKD, CAD, DM2, RA  are also affecting patient's functional outcome.   REHAB POTENTIAL: Fair Several comorbidities and chronicity of pain affecting potential  CLINICAL DECISION MAKING: Evolving/moderate complexity  EVALUATION COMPLEXITY: Moderate   GOALS: Goals reviewed with patient? Yes  SHORT TERM GOALS: Target date: 07/01/22  Pt will be independent with HEP in order to improve strength and balance in order to decrease fall risk and improve function at home and work.  Baseline:07/01/22 HEP given  Goal status: INITIAL    LONG TERM GOALS: Target date: 08/27/22  Patient will increase FOTO score to 46 to demonstrate predicted increase in functional mobility to complete ADLs  Baseline: 29 Goal status: INITIAL  2.  Pt will decrease 5TSTS of 15seconds or less in order to demonstrate  age-matched norm in LE strength to demonstrate decreased risk of recurrent falls  Baseline: 38sec Goal status: INITIAL  3.  Pt will increase 10MWT to at least 0.75ms in order to demonstrate safe walk speed needed for household ambulation  Baseline: self selected: 0.578m fastest: 0.5640mGoal status: INITIAL  4.  Pt will decrease worst pain as reported on NPRS by at least 3 points in order to demonstrate clinically  significant reduction in pain.  Baseline: 10/10 Goal status: INITIAL    PLAN:  PT FREQUENCY: 1-2x/week  PT DURATION: 8 weeks  PLANNED INTERVENTIONS: Therapeutic exercises, Therapeutic activity, Neuromuscular re-education, Balance training, Gait training, Patient/Family education, Self Care, Joint mobilization, Joint manipulation, Stair training, DME instructions, Aquatic Therapy, Dry Needling, Electrical stimulation, Spinal manipulation, Spinal mobilization, Cryotherapy, Moist heat, Traction, Ultrasound, Ionotophoresis '4mg'$ /ml Dexamethasone, Manual therapy, and Re-evaluation.  PLAN FOR NEXT SESSION: SLS, HEP review,   CheDurwin RegesT CheDurwin RegesT 07/05/2022, 1:37 PM

## 2022-07-05 ENCOUNTER — Telehealth: Payer: Self-pay | Admitting: *Deleted

## 2022-07-05 NOTE — Patient Outreach (Signed)
  Care Coordination   Follow Up Visit Note   07/05/2022 Name: [name] MRN: [id number] DOB: 01-Nov-1945  [name] is a 77 y.o. year old female who sees Perrin Maltese, MD for primary care. I spoke with  [name] by phone today.  What matters to the patients health and wellness today?  In home care    Goals Addressed             This Visit's Progress    In home care needs       Care Coordination Interventions: Follow up phone call from patient regarding  paperwork for CAPS Confirmed with patient that her provider's office was able to update her forms for CAP services and faxed them in-patient states that she has not heard any follow up to date Follow up phone call to [place] however they were closed due to the holiday This social worker to call back tomorrow to follow up on CAPS paperwork re-submitted         SDOH assessments and interventions completed:  No     Care Coordination Interventions:  Yes, provided   Follow up plan: Follow up call scheduled for 07/06/22    Encounter Outcome:  Pt. Visit Completed

## 2022-07-05 NOTE — Patient Instructions (Signed)
Visit Information  Thank you for taking time to visit with me today. Please don't hesitate to contact me if I can be of assistance to you.   Following are the goals we discussed today:   Goals Addressed             This Visit's Progress    In home care needs       Care Coordination Interventions: Follow up phone call from patient regarding  paperwork for CAPS Confirmed with patient that her provider's office was able to update her forms for CAP services and faxed them in-patient states that she has not heard any follow up to date Follow up phone call to [place] however they were closed due to the holiday This social worker to call back tomorrow to follow up on CAPS paperwork re-submitted         Our next appointment is by telephone on 07/06/22 at 10am  Please call the care guide team at [phone] if you need to cancel or reschedule your appointment.   If you are experiencing a Mental Health or [place] or need someone to talk to, please call 911   Patient verbalizes understanding of instructions and care plan provided today and agrees to view in [place]. Active MyChart status and patient understanding of how to access instructions and care plan via MyChart confirmed with patient.     Telephone follow up appointment with care management team member scheduled for: 07/06/22  Elliot Gurney, [place] Worker  [facility] Care Management [phone]

## 2022-07-06 ENCOUNTER — Ambulatory Visit: Payer: 59 | Admitting: Physical Therapy

## 2022-07-06 ENCOUNTER — Encounter: Payer: Self-pay | Admitting: *Deleted

## 2022-07-07 ENCOUNTER — Ambulatory Visit: Payer: Self-pay

## 2022-07-07 ENCOUNTER — Ambulatory Visit: Payer: Self-pay | Admitting: *Deleted

## 2022-07-07 NOTE — Patient Outreach (Signed)
  Care Coordination   07/07/2022 Name: [name] MRN: [id number] DOB: [birth year]   Care Coordination Outreach Attempts:  An unsuccessful telephone outreach was attempted for a scheduled appointment today.  Follow Up Plan:  Additional outreach attempts will be made to offer the patient care coordination information and services.   Encounter Outcome:  No Answer   Care Coordination Interventions:  No, not indicated    [facility] [place] [phone] direct line

## 2022-07-07 NOTE — Patient Outreach (Signed)
  Care Coordination   Follow Up Visit Note   07/07/2022 Name: [name] MRN: [id number] DOB: 1946-02-08  [name] is a 77 y.o. year old female who sees Perrin Maltese, MD for primary care. I spoke with  [name] by phone today.  What matters to the patients health and wellness today?  Referral update for the CAPS program    Goals Addressed             This Visit's Progress    In home care needs       Care Coordination Interventions: Follow up phone call from patient regarding  paperwork for [place] phone call to [place] confirming that they did receive the updated paperwork and it is now in review by their clinical team-paperwork was received on 06/10/22. NCLIFTS to contact patient by phone and letter once a determination is made This social worker to follow up within the next 2 weeks for update on determination regarding the CAPS program          SDOH assessments and interventions completed:  No     Care Coordination Interventions:  Yes, provided   Follow up plan: Follow up call scheduled for 07/21/22    Encounter Outcome:  Pt. Visit Completed

## 2022-07-07 NOTE — Patient Instructions (Signed)
Visit Information  Thank you for taking time to visit with me today. Please don't hesitate to contact me if I can be of assistance to you.   Following are the goals we discussed today:   Goals Addressed             This Visit's Progress    In home care needs       Care Coordination Interventions: Follow up phone call from patient regarding  paperwork for [place] phone call to [place] confirming that they did receive the updated paperwork and it is now in review by their clinical team-paperwork was received on 06/10/22. NCLIFTS to contact patient by phone and letter once a determination is made This social worker to follow up within the next 2 weeks for update on determination regarding the CAPS program          Our next appointment is by telephone on 07/21/22 at 2pm  Please call the care guide team at [phone] if you need to cancel or reschedule your appointment.   If you are experiencing a Mental Health or [place] or need someone to talk to, please call 911   Patient verbalizes understanding of instructions and care plan provided today and agrees to view in [place]. Active MyChart status and patient understanding of how to access instructions and care plan via MyChart confirmed with patient.     Telephone follow up appointment with care management team member scheduled for: 07/21/22  Elliot Gurney, [place] Worker  [facility] Care Management [phone]

## 2022-07-09 ENCOUNTER — Ambulatory Visit: Payer: 59

## 2022-07-09 DIAGNOSIS — M25551 Pain in right hip: Secondary | ICD-10-CM | POA: Diagnosis not present

## 2022-07-09 DIAGNOSIS — M4316 Spondylolisthesis, lumbar region: Secondary | ICD-10-CM | POA: Diagnosis not present

## 2022-07-09 DIAGNOSIS — G8929 Other chronic pain: Secondary | ICD-10-CM | POA: Diagnosis not present

## 2022-07-09 DIAGNOSIS — M5136 Other intervertebral disc degeneration, lumbar region: Secondary | ICD-10-CM | POA: Diagnosis not present

## 2022-07-09 DIAGNOSIS — M545 Low back pain, unspecified: Secondary | ICD-10-CM

## 2022-07-09 DIAGNOSIS — Z981 Arthrodesis status: Secondary | ICD-10-CM | POA: Diagnosis not present

## 2022-07-09 NOTE — Therapy (Signed)
OUTPATIENT PHYSICAL THERAPY TREATMENT   Patient Name: [name] MRN: [id number] DOB:[birth year], 77 y.o., female Today's Date: 07/09/2022  END OF SESSION:  PT End of Session - 07/09/22 1002     Visit Number 2    Number of Visits 17    Date for PT Re-Evaluation 08/27/22    Authorization Type [employer] Medicare, [employer] secondary    Authorization Time Period 07/01/22-09/03/22    Progress Note Due on Visit 10    PT Start Time 1000    PT Stop Time 1040    PT Time Calculation (min) 40 min    Activity Tolerance Patient tolerated treatment well;No increased pain;Patient limited by fatigue    Behavior During Therapy [facility] for tasks assessed/performed              Past Medical History:  Diagnosis Date   Anemia of chronic renal disease    Arthritis    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 b.)07/18/2006 -endovas oblit complex ACOM aneur. c.) Endovas Tx of 6.8x4mm97mCA aneur. d.) 3.5x3mm 29mnant RMCA aneur 2/2 coil compaction. e.) Interval 3.2x3.1mm s72mular outpouching in ACOM c/w mild recannulization in neck. 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 g.) 3.7x2mm re64mnt of prev Tx'd ACOM aneur -failed embol 02/24/2012.   CKD (chronic kidney disease), stage IV (HCC)    a.) solitary functioning kidney on the RIGHT   Complication of anesthesia    a.) delayed emergence   Coronary artery disease 02/23/2007   a.) LHC 02/23/2007 --> EF 50%; 30% pLAD, 70% mRCA --> planned for staged PCI. b.) PCI 02/27/2007: EF 60%; 3.5 x 15 mm Vision BMS to 80% mRCA. b.) LHC 07/24/2007: EF 60%; minor irregs; no occlusive CAD; no intervention. c.) LHC 10/28/2010: EF 60%; 30% mLAD, LCx with minor luminal irregs, 20% ISR mRCA; no intervention. d.) LHC 05/31/2013: EF 60%; 40% mLAD, 30% ISR m-dRCA; no interventions.   DDD (degenerative disc disease), cervical    a.) s/p ACDF C5-C7; hardware in neck; patient appreciates stiffness and issues with mobility    Diastolic dysfunction    a.)  TTE 07/07/2021: EF 87.8%; normal PASP; trace TR/MR; G1DD.   Elevated hemidiaphragm    a.) LEFT   Headache(784.0)    Heart murmur    HLD (hyperlipidemia)    HOH (hard of hearing)    Has hearing aids, doesn't wear   Hyperparathyroidism due to renal insufficiency (HCC)    Hypertension    Incomplete right bundle branch block (RBBB)    Insomnia    a.) takes zolpidem   Long term (current) use of immunomodulator    a.) on DMARD therapy (hydroxychloriquine) for RA/SLE Dx.   Long term current use of antithrombotics/antiplatelets    a.) clopidogrel   Multiple acquired cysts of kidney    OSA (obstructive sleep apnea)    a.) does NOT use nocturnal pap therapy   Pancreatic cyst    Pneumonia    RBBB (right bundle branch block)    Rheumatoid arthritis (HCC)   [place]) on DMARD; hydroxychloriquine   Systemic lupus erythematosus (HCC)   [place] ([place] 2 [place] (HCC[place]   [place] dent[place]    partial lower   Past Surgical History:  Procedure Laterality Date   ANEURYSM COILING  08/2013   Attempted embolization of previously treated ACOM aneurysm; unsuccessful N/A 01/28/2012   [place]  BICEPT TENODESIS Right 03/02/2022   Procedure: BICEPS TENODESIS;  Surgeon: Corky Mull, MD;  Location: [facility];  Service: Orthopedics;  Laterality: Right;   BREAST CYST ASPIRATION Right 01/25/2012   FNA neg.   BREAST EXCISIONAL BIOPSY Left 06/26/2007   neg   BUNIONECTOMY Bilateral 2022   CARDIAC CATHETERIZATION Left 02/23/2007   Procedure: CARDIAC CATHETERIZATION; Location: [place]; Surgeon: Neoma Laming, MD   CARDIAC CATHETERIZATION Left 07/31/2007   Procedure: CARDIAC CATHETERIZATION; Location: [place]; Surgeon: Neoma Laming, MD   CARDIAC CATHETERIZATION Left 10/28/2010   Procedure: CARDIAC CATHETERIZATION; Location: [place]; Surgeon: Neoma Laming, MD   CARDIAC CATHETERIZATION Left 05/31/2013    Procedure: CARDIAC CATHETERIZATION; Location: [place]; Surgeon: Neoma Laming, MD   Carotid arteriogram; 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 N/A 07/18/2006   Location: [facility]   CATARACT EXTRACTION     CERVICAL FUSION N/A    Procedure: ACDF C5-C7   COLONOSCOPY WITH PROPOFOL N/A 10/04/2019   Procedure: COLONOSCOPY WITH BIOPSY;  Surgeon: Lin Landsman, MD;  Location: [place];  Service: Endoscopy;  Laterality: N/A;  Diabetic (borderline) - oral meds priority 3   CORONARY STENT INTERVENTION Left 02/27/2007   Procedure: CORONARY STENT INTERVENTION (3.5 x 15 mm Vision BMS to mRCA); Location: [place]; Surgeon: Kathlyn Sacramento, MD   Endovascular near complete obliteration of enlarging neck remnant of previously treated RIGHT MCA aneurysm using stent assisted coiling N/A 09/03/2010   Location: [facility]   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 N/A 10/03/2006   Location: [facility]   HYSTEROSCOPY WITH D & C N/A 08/04/2021   Procedure: DILATATION AND CURETTAGE /HYSTEROSCOPY;  Surgeon: Homero Fellers, MD;  Location: [facility];  Service: Gynecology;  Laterality: N/A;   POLYPECTOMY N/A 10/04/2019   Procedure: POLYPECTOMY;  Surgeon: Lin Landsman, MD;  Location: [place];  Service: Endoscopy;  Laterality: N/A;   REVERSE SHOULDER ARTHROPLASTY Right 03/02/2022   Procedure: REVERSE SHOULDER ARTHROPLASTY;  Surgeon: Corky Mull, MD;  Location: [facility];  Service: Orthopedics;  Laterality: Right;   UPPER ESOPHAGEAL ENDOSCOPIC ULTRASOUND (EUS) N/A 04/15/2016   Procedure: UPPER ESOPHAGEAL ENDOSCOPIC ULTRASOUND (EUS);  Surgeon: Holly Bodily, MD;  Location: [facility];  Service: Gastroenterology;  Laterality: N/A;   Patient Active Problem List   Diagnosis Date Noted   Status post reverse total shoulder replacement, 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   Thickened endometrium    Rectal  bleeding    Systemic lupus erythematosus ([place]) 09/19/2019   Coronary disease 11/03/2017   Diabetes ([place]) 11/03/2017   Gout 11/03/2017   Osteoarthritis 11/03/2017    PCP: Humphrey Rolls MD  REFERRING PROVIDER: Meade Maw MD  REFERRING DIAG: low back pain   Rationale for Evaluation and Treatment: Rehabilitation  THERAPY DIAG:  Chronic bilateral low back pain, unspecified whether sciatica present  Pain in right hip  ONSET DATE: Jan 2023  SUBJECTIVE:  SUBJECTIVE STATEMENT: Acute on chronic LBP  PERTINENT HISTORY:  Pt is a 77 year old female presenting with with chronic LBP with most recent flare up "about a year ago with insideous onset". PMH of posterior decompression and posterior spinal fusion at L4-L5. Pt reports bilat LE pain and n/t down BLE but the pain "takes turns". Current and best pain 5/10; worst 10/10. Has most pain in the morning when she wakes up, walking >60mns, standing >571ms (to cook), lifting, carrying, stair ascent. Pt is retired, enjoys watching TV, "because there is not very much I can do". Before her back pain flared up she enjoyed cooking, gardened, and enjoyed walking. Pt reports having SPC that she uses when she has increased pain, and adaptive bathroom in her apartment (that she lives in alone). Has had 1 fall in the past 6 months in the shower while trying to shave her legs. Does endorse feeling off balance, that her "feet do things on their own", denies neuropathy or diabetes. Pt denies N/V, B&B changes, unexplained weight fluctuation, saddle paresthesia, fever, night sweats, or unrelenting night pain at this time.   PAIN:  Are you having pain? Yes: NPRS scale: 5/10 Pain location: across low back; "random radiating pain into Les" Pain description: dull ache Aggravating  factors: morning when she wakes up, walking >49m66m, lifting, carrying Relieving factors: gabapentin, rest  PRECAUTIONS: None  WEIGHT BEARING RESTRICTIONS: No  FALLS:  Has patient fallen in last 6 months? Yes. Number of falls 1  OCCUPATION: retired  PLOF: Independent with basic ADLs  PATIENT GOALS: decrease pain to be able to walk again   OBJECTIVE:   DIAGNOSTIC FINDINGS:  MRI 10/02/21  Interval posterior decompression and posterior spinal fusion at L4-L5. Multifactorial mild-to-moderate spinal canal narrowing at the level of the L4 inferior endplate has improved. Endplate osteophytes contribute to moderate right neural foraminal narrowing at this level.  Additional lumbar and lower thoracic spondylosis, as outlined and having progressed at multiple levels since the prior MRI of 02/26/2006. Findings are most notably as follows.  At L3-L4, there is progressive multifactorial severe central canal and left subarticular stenosis, and progressive moderate right subarticular stenosis. Progressive moderate bilateral neural foraminal narrowing at this level. Also of note, facet arthrosis is advanced at this level and bilateral facet joint effusions are present.  No more than mild relative narrowing of the central canal at the remaining levels. Additional sites of subarticular stenosis, as detailed and greatest on the left at L5-S1 (moderate at this site with crowding of the descending left S1 nerve root).  Progressive advanced disc degeneration at L1-L2.     TODAY'S TREATMENT:                                                                                                                              DATE: 07/09/22   -Nustep, Seat 8 AA/ROM, legs only level 1 x 4 minutes  -seated forward physio ball rollouts 15x1secH (noted maintained  lumbar lordosis)   -STS from elevated 1x10, cues for hands on knees, table at 19 inches -marching 1x20 seated at plinth, reciprocal -STS from  elevated 1x10, cues for hands on knees, table at 19 inches -marching 1x20 seated at plinth, reciprocal  -HEP review: 1x10 minisquat, 1x10 hip ABDCT bilat *excellent demonstration  -supine SKTC 2x30sec bilat, author assisting -deadbug isometrics 10x5sec H, labored, symptomatic bilat hips, improves with time  -RedTB clamshells seated, blueball between feet 1x15 -yellow TB reverse calm, blue ball between knees 1x15 -RedTB clamshells seated, blueball between feet 1x15 -yellow TB reverse calm, blue ball between knees 1x15     Prior HEP assignment - Mini Squat with Counter Support  - 2 x weekly - 3 sets - 8-12 reps - Standing Hip Abduction with Counter Support  - 7 x weekly - 3 sets - 8-12 reps  PATIENT EDUCATION:  Education details: Patient was educated on diagnosis, anatomy and pathology involved, prognosis, role of PT, and was given an HEP, demonstrating exercise with proper form following verbal and tactile cues, and was given a paper hand out to continue exercise at home. Pt was educated on and agreed to plan of care.  Person educated: Patient Education method: Explanation, Demonstration, and Handouts Education comprehension: verbalized understanding, returned demonstration, and verbal cues required  HOME EXERCISE PROGRAM: Access Code: [email or web address]  ASSESSMENT:  CLINICAL IMPRESSION: Started with gentle ROM, core strength, and stretching work today. Pt demonstrates correct form with HEP as issued last session. Pt especially challenged with isometric core strengthening. Would benefit from skilled PT to address above deficits and promote optimal return to PLOF.   OBJECTIVE IMPAIRMENTS: Abnormal gait, decreased activity tolerance, decreased balance, decreased coordination, decreased endurance, decreased mobility, difficulty walking, decreased ROM, decreased strength, increased fascial restrictions, impaired flexibility, impaired tone, improper body mechanics, postural dysfunction,  obesity, and pain.   ACTIVITY LIMITATIONS: carrying, lifting, bending, sitting, standing, squatting, stairs, transfers, bathing, hygiene/grooming, and locomotion level  PARTICIPATION LIMITATIONS: meal prep, cleaning, laundry, driving, community activity, and yard work  PERSONAL FACTORS: Age, Fitness, Past/current experiences, Time since onset of injury/illness/exacerbation, and 3+ comorbidities: HTN, HLD, DDD, OA, CKD, CAD, DM2, RA  are also affecting patient's functional outcome.   REHAB POTENTIAL: Fair Several comorbidities and chronicity of pain affecting potential  CLINICAL DECISION MAKING: Evolving/moderate complexity  EVALUATION COMPLEXITY: Moderate   GOALS: Goals reviewed with patient? Yes  SHORT TERM GOALS: Target date: 07/01/22  Pt will be independent with HEP in order to improve strength and balance in order to decrease fall risk and improve function at home and work.  Baseline:07/01/22 HEP given  Goal status: INITIAL    LONG TERM GOALS: Target date: 08/27/22  Patient will increase FOTO score to 46 to demonstrate predicted increase in functional mobility to complete ADLs  Baseline: 29 Goal status: INITIAL  2.  Pt will decrease 5TSTS of 15seconds or less in order to demonstrate  age-matched norm in LE strength to demonstrate decreased risk of recurrent falls  Baseline: 38sec Goal status: INITIAL  3.  Pt will increase 10MWT to at least 0.22ms in order to demonstrate safe walk speed needed for household ambulation  Baseline: self selected: 0.571m fastest: 0.5667mGoal status: INITIAL  4.  Pt will decrease worst pain as reported on NPRS by at least 3 points in order to demonstrate clinically significant reduction in pain.  Baseline: 10/10 Goal status: INITIAL    PLAN:  PT FREQUENCY: 1-2x/week  PT DURATION: 8 weeks  PLANNED INTERVENTIONS: Therapeutic exercises, Therapeutic activity,  Neuromuscular re-education, Balance training, Gait training, Patient/Family  education, Self Care, Joint mobilization, Joint manipulation, Stair training, DME instructions, Aquatic Therapy, Dry Needling, Electrical stimulation, Spinal manipulation, Spinal mobilization, Cryotherapy, Moist heat, Traction, Ultrasound, Ionotophoresis '4mg'$ /ml Dexamethasone, Manual therapy, and Re-evaluation.  PLAN FOR NEXT SESSION: interventions to reduce rigid lumbar lordosis improve flexion ROM, core flexion strength, standing postural retraining BLE strength   10:12 AM, 07/09/22 Etta Grandchild, PT, DPT Physical Therapist - [place] [phone] (Office)   Adriauna Campton C, PT 07/09/2022, 10:12 AM

## 2022-07-13 ENCOUNTER — Telehealth: Payer: Self-pay | Admitting: *Deleted

## 2022-07-13 ENCOUNTER — Ambulatory Visit: Payer: 59 | Admitting: Physical Therapy

## 2022-07-13 ENCOUNTER — Encounter: Payer: Self-pay | Admitting: Physical Therapy

## 2022-07-13 DIAGNOSIS — Z981 Arthrodesis status: Secondary | ICD-10-CM | POA: Diagnosis not present

## 2022-07-13 DIAGNOSIS — M25551 Pain in right hip: Secondary | ICD-10-CM | POA: Diagnosis not present

## 2022-07-13 DIAGNOSIS — M545 Low back pain, unspecified: Secondary | ICD-10-CM | POA: Diagnosis not present

## 2022-07-13 DIAGNOSIS — M5136 Other intervertebral disc degeneration, lumbar region: Secondary | ICD-10-CM | POA: Diagnosis not present

## 2022-07-13 DIAGNOSIS — G8929 Other chronic pain: Secondary | ICD-10-CM | POA: Diagnosis not present

## 2022-07-13 DIAGNOSIS — M4316 Spondylolisthesis, lumbar region: Secondary | ICD-10-CM | POA: Diagnosis not present

## 2022-07-13 NOTE — Progress Notes (Signed)
  Care Coordination Note  07/13/2022 Name: [name] MRN: [id number] DOB: [birth year]  [name] is a 77 y.o. year old female who is a primary care patient of Perrin Maltese, MD and is actively engaged with the care management team. I reached out to [employer] by phone today to assist with re-scheduling a follow up visit with the RN Case Manager  Follow up plan: Unsuccessful telephone outreach attempt made. A HIPAA compliant phone message was left for the patient providing contact information and requesting a return call.   Julian Hy, [place] Direct Dial: [phone]

## 2022-07-13 NOTE — Therapy (Signed)
OUTPATIENT PHYSICAL THERAPY TREATMENT   Patient Name: [name] MRN: [id number] DOB:11-21-1945, 77 y.o., female Today's Date: 07/13/2022  END OF SESSION:  PT End of Session - 07/13/22 1352     Visit Number 3    Number of Visits 17    Date for PT Re-Evaluation 08/27/22    Authorization Type [employer] Medicare, [employer] secondary    Authorization Time Period 07/01/22-09/03/22    Authorization - Visit Number 3    Progress Note Due on Visit 10    PT Start Time 6659    PT Stop Time 1425    PT Time Calculation (min) 40 min    Activity Tolerance Patient tolerated treatment well;No increased pain;Patient limited by fatigue    Behavior During Therapy [facility] for tasks assessed/performed               Past Medical History:  Diagnosis Date   Anemia of chronic renal disease    Arthritis    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 b.)07/18/2006 -endovas oblit complex ACOM aneur. c.) Endovas Tx of 6.8x4mm64mCA aneur. d.) 3.5x3mm 57mnant RMCA aneur 2/2 coil compaction. e.) Interval 3.2x3.1mm s63mular outpouching in ACOM c/w mild recannulization in neck. 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 g.) 3.7x2mm re33mnt of prev Tx'd ACOM aneur -failed embol 02/24/2012.   CKD (chronic kidney disease), stage IV (HCC)    a.) solitary functioning kidney on the RIGHT   Complication of anesthesia    a.) delayed emergence   Coronary artery disease 02/23/2007   a.) LHC 02/23/2007 --> EF 50%; 30% pLAD, 70% mRCA --> planned for staged PCI. b.) PCI 02/27/2007: EF 60%; 3.5 x 15 mm Vision BMS to 80% mRCA. b.) LHC 07/24/2007: EF 60%; minor irregs; no occlusive CAD; no intervention. c.) LHC 10/28/2010: EF 60%; 30% mLAD, LCx with minor luminal irregs, 20% ISR mRCA; no intervention. d.) LHC 05/31/2013: EF 60%; 40% mLAD, 30% ISR m-dRCA; no interventions.   DDD (degenerative disc disease), cervical    a.) s/p ACDF C5-C7; hardware in neck; patient appreciates  stiffness and issues with mobility   Diastolic dysfunction    a.)  TTE 07/07/2021: EF 87.8%; normal PASP; trace TR/MR; G1DD.   Elevated hemidiaphragm    a.) LEFT   Headache(784.0)    Heart murmur    HLD (hyperlipidemia)    HOH (hard of hearing)    Has hearing aids, doesn't wear   Hyperparathyroidism due to renal insufficiency (HCC)    Hypertension    Incomplete right bundle branch block (RBBB)    Insomnia    a.) takes zolpidem   Long term (current) use of immunomodulator    a.) on DMARD therapy (hydroxychloriquine) for RA/SLE Dx.   Long term current use of antithrombotics/antiplatelets    a.) clopidogrel   Multiple acquired cysts of kidney    OSA (obstructive sleep apnea)    a.) does NOT use nocturnal pap therapy   Pancreatic cyst    Pneumonia    RBBB (right bundle branch block)    Rheumatoid arthritis (HCC)   [place]) on DMARD; hydroxychloriquine   Systemic lupus erythematosus (HCC)   [place] (type 2 diabetes mellitus) (HCC)   Ensignars dentures    partial lower   Past Surgical History:  Procedure Laterality Date   ANEURYSM COILING  08/2013   Attempted embolization of previously treated ACOM aneurysm; unsuccessful N/A 01/28/2012   [place]  BARTHOLIN CYST MARSUPIALIZATION     BICEPT TENODESIS Right 03/02/2022   Procedure: BICEPS TENODESIS;  Surgeon: Corky Mull, MD;  Location: [facility];  Service: Orthopedics;  Laterality: Right;   BREAST CYST ASPIRATION Right 01/25/2012   FNA neg.   BREAST EXCISIONAL BIOPSY Left 06/26/2007   neg   BUNIONECTOMY Bilateral 2022   CARDIAC CATHETERIZATION Left 02/23/2007   Procedure: CARDIAC CATHETERIZATION; Location: [place]; Surgeon: Neoma Laming, MD   CARDIAC CATHETERIZATION Left 07/31/2007   Procedure: CARDIAC CATHETERIZATION; Location: [place]; Surgeon: Neoma Laming, MD   CARDIAC CATHETERIZATION Left 10/28/2010   Procedure: CARDIAC CATHETERIZATION; Location: [place]; Surgeon: Neoma Laming, MD   CARDIAC  CATHETERIZATION Left 05/31/2013   Procedure: CARDIAC CATHETERIZATION; Location: [place]; Surgeon: Neoma Laming, MD   Carotid arteriogram; 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 N/A 07/18/2006   Location: [facility]   CATARACT EXTRACTION     CERVICAL FUSION N/A    Procedure: ACDF C5-C7   COLONOSCOPY WITH PROPOFOL N/A 10/04/2019   Procedure: COLONOSCOPY WITH BIOPSY;  Surgeon: Lin Landsman, MD;  Location: [place];  Service: Endoscopy;  Laterality: N/A;  Diabetic (borderline) - oral meds priority 3   CORONARY STENT INTERVENTION Left 02/27/2007   Procedure: CORONARY STENT INTERVENTION (3.5 x 15 mm Vision BMS to mRCA); Location: [place]; Surgeon: Kathlyn Sacramento, MD   Endovascular near complete obliteration of enlarging neck remnant of previously treated RIGHT MCA aneurysm using stent assisted coiling N/A 09/03/2010   Location[facility]   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 N/A 10/03/2006   Location: [facility]   HYSTEROSCOPY WITH D & C N/A 08/04/2021   Procedure: DILATATION AND CURETTAGE /HYSTEROSCOPY;  Surgeon: Homero Fellers, MD;  Location: [facility];  Service: Gynecology;  Laterality: N/A;   POLYPECTOMY N/A 10/04/2019   Procedure: POLYPECTOMY;  Surgeon: Lin Landsman, MD;  Location: [place];  Service: Endoscopy;  Laterality: N/A;   REVERSE SHOULDER ARTHROPLASTY Right 03/02/2022   Procedure: REVERSE SHOULDER ARTHROPLASTY;  Surgeon: Corky Mull, MD;  Location: [facility];  Service: Orthopedics;  Laterality: Right;   UPPER ESOPHAGEAL ENDOSCOPIC ULTRASOUND (EUS) N/A 04/15/2016   Procedure: UPPER ESOPHAGEAL ENDOSCOPIC ULTRASOUND (EUS);  Surgeon: Holly Bodily, MD;  Location: [facility];  Service: Gastroenterology;  Laterality: N/A;   Patient Active Problem List   Diagnosis Date Noted   Status post reverse total shoulder replacement, 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    Thickened endometrium    Rectal bleeding    Systemic lupus erythematosus ([place]) 09/19/2019   Coronary disease 11/03/2017   Diabetes ([place]) 11/03/2017   Gout 11/03/2017   Osteoarthritis 11/03/2017    PCP: Humphrey Rolls MD  REFERRING PROVIDER: Meade Maw MD  REFERRING DIAG: low back pain   Rationale for Evaluation and Treatment: Rehabilitation  THERAPY DIAG:  Chronic bilateral low back pain, unspecified whether sciatica present  ONSET DATE: Jan 2023  SUBJECTIVE:  SUBJECTIVE STATEMENT: Pt reports she went to the grocery yesterday and was leaning on the cart when she was walking, and her back hurt her after 13mns causing her to cut her trip short. Completing HEP. Current LBP 4/10  PERTINENT HISTORY:  Pt is a 77year old female presenting with with chronic LBP with most recent flare up "about a year ago with insideous onset". PMH of posterior decompression and posterior spinal fusion at L4-L5. Pt reports bilat LE pain and n/t down BLE but the pain "takes turns". Current and best pain 5/10; worst 10/10. Has most pain in the morning when she wakes up, walking >559ms, standing >82m29m (to cook), lifting, carrying, stair ascent. Pt is retired, enjoys watching TV, "because there is not very much I can do". Before her back pain flared up she enjoyed cooking, gardened, and enjoyed walking. Pt reports having SPC that she uses when she has increased pain, and adaptive bathroom in her apartment (that she lives in alone). Has had 1 fall in the past 6 months in the shower while trying to shave her legs. Does endorse feeling off balance, that her "feet do things on their own", denies neuropathy or diabetes. Pt denies N/V, B&B changes, unexplained weight fluctuation, saddle paresthesia, fever, night sweats, or unrelenting night  pain at this time.   PAIN:  Are you having pain? Yes: NPRS scale: 5/10 Pain location: across low back; "random radiating pain into Les" Pain description: dull ache Aggravating factors: morning when she wakes up, walking >82mi33m lifting, carrying Relieving factors: gabapentin, rest  PRECAUTIONS: None  WEIGHT BEARING RESTRICTIONS: No  FALLS:  Has patient fallen in last 6 months? Yes. Number of falls 1  OCCUPATION: retired  PLOF: Independent with basic ADLs  PATIENT GOALS: decrease pain to be able to walk again   OBJECTIVE:   DIAGNOSTIC FINDINGS:  MRI 10/02/21  Interval posterior decompression and posterior spinal fusion at L4-L5. Multifactorial mild-to-moderate spinal canal narrowing at the level of the L4 inferior endplate has improved. Endplate osteophytes contribute to moderate right neural foraminal narrowing at this level.  Additional lumbar and lower thoracic spondylosis, as outlined and having progressed at multiple levels since the prior MRI of 02/26/2006. Findings are most notably as follows.  At L3-L4, there is progressive multifactorial severe central canal and left subarticular stenosis, and progressive moderate right subarticular stenosis. Progressive moderate bilateral neural foraminal narrowing at this level. Also of note, facet arthrosis is advanced at this level and bilateral facet joint effusions are present.  No more than mild relative narrowing of the central canal at the remaining levels. Additional sites of subarticular stenosis, as detailed and greatest on the left at L5-S1 (moderate at this site with crowding of the descending left S1 nerve root).  Progressive advanced disc degeneration at L1-L2.     TODAY'S TREATMENT:  DATE: 07/13/22   -Nustep, Seat 8 AA/ROM, legs only level 1 x 4 minutes  -seated forward physio ball  rollouts 15x1secH (noted maintained lumbar lordosis)   -STS from elevated 1x10, cues for hands on knees, table at 24 inches -marching 1x20 seated at plinth, reciprocal -STS from elevated 1x10, cues for hands on knees, table at 19 inches -marching 1x20 seated at plinth, reciprocal  Sidestepping 18f L and R; with RTB 269fL and R with in cuing for increased eccentric control with good carry over  Bridge x10 with cuing for glute max activation and equal R hip ext to L with good carry over deadbug with alt LE marching only into R tball x10 (attempt true deadbug and bilat LE lift unable)  Bridge x10 with cuing for glute max activation and equal R hip ext to L with good carry over deadbug with alt LE marching only into R tball x10  Lower trunk rotations x20 SKTC 5secH x3 bilat     Prior HEP assignment - Mini Squat with Counter Support  - 2 x weekly - 3 sets - 8-12 reps - Standing Hip Abduction with Counter Support  - 7 x weekly - 3 sets - 8-12 reps  PATIENT EDUCATION:  Education details: Patient was educated on diagnosis, anatomy and pathology involved, prognosis, role of PT, and was given an HEP, demonstrating exercise with proper form following verbal and tactile cues, and was given a paper hand out to continue exercise at home. Pt was educated on and agreed to plan of care.  Person educated: Patient Education method: Explanation, Demonstration, and Handouts Education comprehension: verbalized understanding, returned demonstration, and verbal cues required  HOME EXERCISE PROGRAM: Access Code: [email or web address]:  CLINICAL IMPRESSION: PT continued therex progression for functional hip and core strengthening with success. Pt is able to comply with all cuing for proper technique of therex wit minimal increased pain throughout session. Patient with good motivation throughout session. PT will continue progression as able.    OBJECTIVE IMPAIRMENTS: Abnormal gait, decreased activity  tolerance, decreased balance, decreased coordination, decreased endurance, decreased mobility, difficulty walking, decreased ROM, decreased strength, increased fascial restrictions, impaired flexibility, impaired tone, improper body mechanics, postural dysfunction, obesity, and pain.   ACTIVITY LIMITATIONS: carrying, lifting, bending, sitting, standing, squatting, stairs, transfers, bathing, hygiene/grooming, and locomotion level  PARTICIPATION LIMITATIONS: meal prep, cleaning, laundry, driving, community activity, and yard work  PERSONAL FACTORS: Age, Fitness, Past/current experiences, Time since onset of injury/illness/exacerbation, and 3+ comorbidities: HTN, HLD, DDD, OA, CKD, CAD, DM2, RA  are also affecting patient's functional outcome.   REHAB POTENTIAL: Fair Several comorbidities and chronicity of pain affecting potential  CLINICAL DECISION MAKING: Evolving/moderate complexity  EVALUATION COMPLEXITY: Moderate   GOALS: Goals reviewed with patient? Yes  SHORT TERM GOALS: Target date: 07/01/22  Pt will be independent with HEP in order to improve strength and balance in order to decrease fall risk and improve function at home and work.  Baseline:07/01/22 HEP given  Goal status: INITIAL    LONG TERM GOALS: Target date: 08/27/22  Patient will increase FOTO score to 46 to demonstrate predicted increase in functional mobility to complete ADLs  Baseline: 29 Goal status: INITIAL  2.  Pt will decrease 5TSTS of 15seconds or less in order to demonstrate  age-matched norm in LE strength to demonstrate decreased risk of recurrent falls  Baseline: 38sec Goal status: INITIAL  3.  Pt will increase 10MWT to at least 0.65m65min order to demonstrate safe walk speed needed for  household ambulation  Baseline: self selected: 0.57ms fastest: 0.518m Goal status: INITIAL  4.  Pt will decrease worst pain as reported on NPRS by at least 3 points in order to demonstrate clinically significant  reduction in pain.  Baseline: 10/10 Goal status: INITIAL    PLAN:  PT FREQUENCY: 1-2x/week  PT DURATION: 8 weeks  PLANNED INTERVENTIONS: Therapeutic exercises, Therapeutic activity, Neuromuscular re-education, Balance training, Gait training, Patient/Family education, Self Care, Joint mobilization, Joint manipulation, Stair training, DME instructions, Aquatic Therapy, Dry Needling, Electrical stimulation, Spinal manipulation, Spinal mobilization, Cryotherapy, Moist heat, Traction, Ultrasound, Ionotophoresis '4mg'$ /ml Dexamethasone, Manual therapy, and Re-evaluation.  PLAN FOR NEXT SESSION: interventions to reduce rigid lumbar lordosis improve flexion ROM, core flexion strength, standing postural retraining BLE strength   2:27 PM, 07/13/22 AlEtta GrandchildPT, DPT Physical Therapist - [phone])   ChDurwin RegesPT 07/13/2022, 2:27 PM

## 2022-07-15 ENCOUNTER — Ambulatory Visit: Payer: 59 | Admitting: Physical Therapy

## 2022-07-15 ENCOUNTER — Encounter: Payer: Self-pay | Admitting: Physical Therapy

## 2022-07-15 DIAGNOSIS — M4316 Spondylolisthesis, lumbar region: Secondary | ICD-10-CM | POA: Diagnosis not present

## 2022-07-15 DIAGNOSIS — G8929 Other chronic pain: Secondary | ICD-10-CM | POA: Diagnosis not present

## 2022-07-15 DIAGNOSIS — Z981 Arthrodesis status: Secondary | ICD-10-CM | POA: Diagnosis not present

## 2022-07-15 DIAGNOSIS — M5136 Other intervertebral disc degeneration, lumbar region: Secondary | ICD-10-CM | POA: Diagnosis not present

## 2022-07-15 DIAGNOSIS — M545 Low back pain, unspecified: Secondary | ICD-10-CM

## 2022-07-15 DIAGNOSIS — M25551 Pain in right hip: Secondary | ICD-10-CM | POA: Diagnosis not present

## 2022-07-15 NOTE — Therapy (Signed)
OUTPATIENT PHYSICAL THERAPY TREATMENT   Patient Name: [name] MRN: [id number] DOB:[birth year], 77 y.o., female Today's Date: 07/15/2022  END OF SESSION:  PT End of Session - 07/15/22 1434     Visit Number 4    Number of Visits 17    Date for PT Re-Evaluation 08/27/22    Authorization Type [employer] Medicare, [employer] secondary    Authorization Time Period 07/01/22-09/03/22    Authorization - Visit Number 4    Progress Note Due on Visit 10    PT Start Time 1430    PT Stop Time 1510    PT Time Calculation (min) 40 min    Activity Tolerance Patient tolerated treatment well    Behavior During Therapy [facility] for tasks assessed/performed                Past Medical History:  Diagnosis Date   Anemia of chronic renal disease    Arthritis    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 b.)07/18/2006 -endovas oblit complex ACOM aneur. c.) Endovas Tx of 6.8x4mm17mCA aneur. d.) 3.5x3mm 693mnant RMCA aneur 2/2 coil compaction. e.) Interval 3.2x3.1mm s31mular outpouching in ACOM c/w mild recannulization in neck. 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 g.) 3.7x2mm re693mnt of prev Tx'd ACOM aneur -failed embol 02/24/2012.   CKD (chronic kidney disease), stage IV (HCC)    a.) solitary functioning kidney on the RIGHT   Complication of anesthesia    a.) delayed emergence   Coronary artery disease 02/23/2007   a.) LHC 02/23/2007 --> EF 50%; 30% pLAD, 70% mRCA --> planned for staged PCI. b.) PCI 02/27/2007: EF 60%; 3.5 x 15 mm Vision BMS to 80% mRCA. b.) LHC 07/24/2007: EF 60%; minor irregs; no occlusive CAD; no intervention. c.) LHC 10/28/2010: EF 60%; 30% mLAD, LCx with minor luminal irregs, 20% ISR mRCA; no intervention. d.) LHC 05/31/2013: EF 60%; 40% mLAD, 30% ISR m-dRCA; no interventions.   DDD (degenerative disc disease), cervical    a.) s/p ACDF C5-C7; hardware in neck; patient appreciates stiffness and issues with mobility    Diastolic dysfunction    a.)  TTE 07/07/2021: EF 87.8%; normal PASP; trace TR/MR; G1DD.   Elevated hemidiaphragm    a.) LEFT   Headache(784.0)    Heart murmur    HLD (hyperlipidemia)    HOH (hard of hearing)    Has hearing aids, doesn't wear   Hyperparathyroidism due to renal insufficiency (HCC)    Hypertension    Incomplete right bundle branch block (RBBB)    Insomnia    a.) takes zolpidem   Long term (current) use of immunomodulator    a.) on DMARD therapy (hydroxychloriquine) for RA/SLE Dx.   Long term current use of antithrombotics/antiplatelets    a.) clopidogrel   Multiple acquired cysts of kidney    OSA (obstructive sleep apnea)    a.) does NOT use nocturnal pap therapy   Pancreatic cyst    Pneumonia    RBBB (right bundle branch block)    Rheumatoid arthritis (HCC)   [place]) on DMARD; hydroxychloriquine   Systemic lupus erythematosus (HCC)   [place] (type 2 diabetes mellitus) (HCC)   [place] dentures    partial lower   Past Surgical History:  Procedure Laterality Date   ANEURYSM COILING  08/2013   Attempted embolization of previously treated ACOM aneurysm; unsuccessful N/A 01/28/2012   Location: [place] [facility]  BARTHOLIN CYST MARSUPIALIZATION     BICEPT TENODESIS Right 03/02/2022   Procedure: BICEPS TENODESIS;  Surgeon: Corky Mull, MD;  Location: [facility];  Service: Orthopedics;  Laterality: Right;   BREAST CYST ASPIRATION Right 01/25/2012   FNA neg.   BREAST EXCISIONAL BIOPSY Left 06/26/2007   neg   BUNIONECTOMY Bilateral 2022   CARDIAC CATHETERIZATION Left 02/23/2007   Procedure: CARDIAC CATHETERIZATION; Location: [place]; Surgeon: Neoma Laming, MD   CARDIAC CATHETERIZATION Left 07/31/2007   Procedure: CARDIAC CATHETERIZATION; Location: [place]; Surgeon: Neoma Laming, MD   CARDIAC CATHETERIZATION Left 10/28/2010   Procedure: CARDIAC CATHETERIZATION; Location: [place]; Surgeon: Neoma Laming, MD   CARDIAC CATHETERIZATION Left 05/31/2013    Procedure: CARDIAC CATHETERIZATION; Location: [place]; Surgeon: Neoma Laming, MD   Carotid arteriogram; 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 N/A 07/18/2006   Location: [facility]   CATARACT EXTRACTION     CERVICAL FUSION N/A    Procedure: ACDF C5-C7   COLONOSCOPY WITH PROPOFOL N/A 10/04/2019   Procedure: COLONOSCOPY WITH BIOPSY;  Surgeon: Lin Landsman, MD;  Location: [place];  Service: Endoscopy;  Laterality: N/A;  Diabetic (borderline) - oral meds priority 3   CORONARY STENT INTERVENTION Left 02/27/2007   Procedure: CORONARY STENT INTERVENTION (3.5 x 15 mm Vision BMS to mRCA); Location: [place]; Surgeon: Kathlyn Sacramento, MD   Endovascular near complete obliteration of enlarging neck remnant of previously treated RIGHT MCA aneurysm using stent assisted coiling N/A 09/03/2010   Location: [facility]   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 N/A 10/03/2006   Location: [facility]   HYSTEROSCOPY WITH D & C N/A 08/04/2021   Procedure: DILATATION AND CURETTAGE /HYSTEROSCOPY;  Surgeon: Homero Fellers, MD;  Location: [facility];  Service: Gynecology;  Laterality: N/A;   POLYPECTOMY N/A 10/04/2019   Procedure: POLYPECTOMY;  Surgeon: Lin Landsman, MD;  Location: [place];  Service: Endoscopy;  Laterality: N/A;   REVERSE SHOULDER ARTHROPLASTY Right 03/02/2022   Procedure: REVERSE SHOULDER ARTHROPLASTY;  Surgeon: Corky Mull, MD;  Location: [facility];  Service: Orthopedics;  Laterality: Right;   UPPER ESOPHAGEAL ENDOSCOPIC ULTRASOUND (EUS) N/A 04/15/2016   Procedure: UPPER ESOPHAGEAL ENDOSCOPIC ULTRASOUND (EUS);  Surgeon: Holly Bodily, MD;  Location[facility];  Service: Gastroenterology;  Laterality: N/A;   Patient Active Problem List   Diagnosis Date Noted   Status post reverse total shoulder replacement, 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 "I have arthritis in both knees and they are hurting today." Increased LBP this morning, reporting 5/10 in this area today. Reports she did her HEP yesterday. She reports her LBP in the am is like birth pain, "it comes on quick and goes away".   PERTINENT HISTORY:  Pt is a 77 year old female presenting with with chronic LBP with most recent flare up "about a year ago with insideous onset". PMH of posterior decompression and posterior spinal fusion at L4-L5. Pt reports bilat LE pain and n/t down BLE but the pain "takes turns". Current and best pain 5/10; worst 10/10. Has most pain in the morning when she wakes up, walking >32mns, standing >544ms (to cook), lifting, carrying, stair ascent. Pt is retired, enjoys watching TV, "because there is not very much I can do". Before her back pain flared up she enjoyed cooking, gardened, and enjoyed walking. Pt reports having SPC that she uses when she has increased pain, and adaptive bathroom in her apartment (that she lives in alone). Has had 1 fall in the past 6 months in the shower while trying to shave her legs. Does endorse feeling off balance, that her "feet do things on their own", denies neuropathy or diabetes. Pt denies N/V, B&B changes, unexplained weight fluctuation, saddle  paresthesia, fever, night sweats, or unrelenting night pain at this time.   PAIN:  Are you having pain? Yes: NPRS scale: 5/10 Pain location: across low back; "random radiating pain into Les" Pain description: dull ache Aggravating factors: morning when she wakes up, walking >73m7m, lifting, carrying Relieving factors: gabapentin, rest  PRECAUTIONS: None  WEIGHT BEARING RESTRICTIONS: No  FALLS:  Has patient fallen in last 6 months? Yes. Number of falls 1  OCCUPATION: retired  PLOF: Independent with basic ADLs  PATIENT GOALS: decrease pain to be able to walk again   OBJECTIVE:   DIAGNOSTIC FINDINGS:  MRI 10/02/21  Interval posterior decompression and posterior spinal fusion at L4-L5. Multifactorial mild-to-moderate spinal canal narrowing at the level of the L4 inferior endplate has improved. Endplate osteophytes contribute to moderate right neural foraminal narrowing at this level.  Additional lumbar and lower thoracic spondylosis, as outlined and having progressed at multiple levels since the prior MRI of 02/26/2006. Findings are most notably as follows.  At L3-L4, there is progressive multifactorial severe central canal and left subarticular stenosis, and progressive moderate right subarticular stenosis. Progressive moderate bilateral neural foraminal narrowing at this level. Also of note, facet arthrosis is advanced at this level and bilateral facet joint effusions are present.  No more than mild relative narrowing of the central canal at the remaining levels. Additional sites of subarticular stenosis, as detailed and greatest on the left at L5-S1 (moderate at this site with crowding of the descending left S1 nerve root).  Progressive advanced disc degeneration at L1-L2.     TODAY'S TREATMENT:  DATE: 07/15/22   -Nustep, Seat 8 AA/ROM, legs  only level 1 x 4 minutes   Hooklying alt trunk rotations x20 SKTC 3x 10sec hold  Hooklying knee fall outs x20 *added to HEP for the am before getting out of bed  Bridge x10 with cuing for glute activation with good carry over Bridge with soccer ball squeeze x10 with good carry over of cuing for technique  Prone alt superman attempt with difficulty with RUE lift x10; LE only x12 with cuing for glute contraction  Seated on theraball TA marching x12  Seated trunk rotation x12  Seated on theraball TA marching x12  Seated trunk rotation x12  Seated childs pose 30sec    HEP: Access Code: [email or web address]  - Supine Lower Trunk Rotation  - 1 x daily - 7 x weekly - 20 reps - 2sec hold - Bent Knee Fallouts  - 1 x daily - 7 x weekly - 15- 20 reps - 2sec hold - Hooklying Single Knee to Chest Stretch  - 1 x daily - 7 x weekly - 3 reps - 10sec hold - Mini Squat with Counter Support  - 2 x weekly - 3 sets - 8-12 reps - Standing Hip Abduction with Counter Support  - 7 x weekly - 3 sets - 8-12 reps  PATIENT EDUCATION:  Education details: Patient was educated on diagnosis, anatomy and pathology involved, prognosis, role of PT, and was given an HEP, demonstrating exercise with proper form following verbal and tactile cues, and was given a paper hand out to continue exercise at home. Pt was educated on and agreed to plan of care.  Person educated: Patient Education method: Explanation, Demonstration, and Handouts Education comprehension: verbalized understanding, returned demonstration, and verbal cues required  HOME EXERCISE PROGRAM: Access Code: [email or web address]  ASSESSMENT:  CLINICAL IMPRESSION: PT continued therex progression for functional hip and core strengthening with success. Pt is able to comply with all cuing for proper technique of therex wit minimal increased pain throughout session. Patient with good motivation throughout session. PT will continue progression as able.    OBJECTIVE  IMPAIRMENTS: Abnormal gait, decreased activity tolerance, decreased balance, decreased coordination, decreased endurance, decreased mobility, difficulty walking, decreased ROM, decreased strength, increased fascial restrictions, impaired flexibility, impaired tone, improper body mechanics, postural dysfunction, obesity, and pain.   ACTIVITY LIMITATIONS: carrying, lifting, bending, sitting, standing, squatting, stairs, transfers, bathing, hygiene/grooming, and locomotion level  PARTICIPATION LIMITATIONS: meal prep, cleaning, laundry, driving, community activity, and yard work  PERSONAL FACTORS: Age, Fitness, Past/current experiences, Time since onset of injury/illness/exacerbation, and 3+ comorbidities: HTN, HLD, DDD, OA, CKD, CAD, DM2, RA  are also affecting patient's functional outcome.   REHAB POTENTIAL: Fair Several comorbidities and chronicity of pain affecting potential  CLINICAL DECISION MAKING: Evolving/moderate complexity  EVALUATION COMPLEXITY: Moderate   GOALS: Goals reviewed with patient? Yes  SHORT TERM GOALS: Target date: 07/01/22  Pt will be independent with HEP in order to improve strength and balance in order to decrease fall risk and improve function at home and work.  Baseline:07/01/22 HEP given  Goal status: INITIAL    LONG TERM GOALS: Target date: 08/27/22  Patient will increase FOTO score to 46 to demonstrate predicted increase in functional mobility to complete ADLs  Baseline: 29 Goal status: INITIAL  2.  Pt will decrease 5TSTS of 15seconds or less in order to demonstrate  age-matched norm in LE strength to demonstrate decreased risk of recurrent falls  Baseline: 38sec Goal status: INITIAL  3.  Pt will increase  10MWT to at least 0.50ms in order to demonstrate safe walk speed needed for household ambulation  Baseline: self selected: 0.564m fastest: 0.5638mGoal status: INITIAL  4.  Pt will decrease worst pain as reported on NPRS by at least 3 points in  order to demonstrate clinically significant reduction in pain.  Baseline: 10/10 Goal status: INITIAL    PLAN:  PT FREQUENCY: 1-2x/week  PT DURATION: 8 weeks  PLANNED INTERVENTIONS: Therapeutic exercises, Therapeutic activity, Neuromuscular re-education, Balance training, Gait training, Patient/Family education, Self Care, Joint mobilization, Joint manipulation, Stair training, DME instructions, Aquatic Therapy, Dry Needling, Electrical stimulation, Spinal manipulation, Spinal mobilization, Cryotherapy, Moist heat, Traction, Ultrasound, Ionotophoresis '4mg'$ /ml Dexamethasone, Manual therapy, and Re-evaluation.  PLAN FOR NEXT SESSION: interventions to reduce rigid lumbar lordosis improve flexion ROM, core flexion strength, standing postural retraining BLE strength    CheDurwin RegesT   CheDurwin RegesT 07/15/2022, 4:00 PM

## 2022-07-20 ENCOUNTER — Encounter: Payer: Self-pay | Admitting: Physical Therapy

## 2022-07-20 ENCOUNTER — Ambulatory Visit: Payer: 59 | Admitting: Physical Therapy

## 2022-07-20 DIAGNOSIS — G8929 Other chronic pain: Secondary | ICD-10-CM

## 2022-07-20 DIAGNOSIS — M25551 Pain in right hip: Secondary | ICD-10-CM | POA: Diagnosis not present

## 2022-07-20 DIAGNOSIS — M5136 Other intervertebral disc degeneration, lumbar region: Secondary | ICD-10-CM | POA: Diagnosis not present

## 2022-07-20 DIAGNOSIS — N184 Chronic kidney disease, stage 4 (severe): Secondary | ICD-10-CM | POA: Diagnosis not present

## 2022-07-20 DIAGNOSIS — I129 Hypertensive chronic kidney disease with stage 1 through stage 4 chronic kidney disease, or unspecified chronic kidney disease: Secondary | ICD-10-CM | POA: Diagnosis not present

## 2022-07-20 DIAGNOSIS — M4316 Spondylolisthesis, lumbar region: Secondary | ICD-10-CM | POA: Diagnosis not present

## 2022-07-20 DIAGNOSIS — Z981 Arthrodesis status: Secondary | ICD-10-CM | POA: Diagnosis not present

## 2022-07-20 DIAGNOSIS — M545 Low back pain, unspecified: Secondary | ICD-10-CM | POA: Diagnosis not present

## 2022-07-20 DIAGNOSIS — E1122 Type 2 diabetes mellitus with diabetic chronic kidney disease: Secondary | ICD-10-CM | POA: Diagnosis not present

## 2022-07-20 NOTE — Therapy (Signed)
OUTPATIENT PHYSICAL THERAPY TREATMENT   Patient Name: [name] MRN: [id number] DOB:05/20/1946, 77 y.o., female Today's Date: 07/20/2022  END OF SESSION:  PT End of Session - 07/20/22 1011     Visit Number 5    Number of Visits 17    Date for PT Re-Evaluation 08/27/22    Authorization Type [employer] Medicare, [employer] secondary    Authorization Time Period 07/01/22-09/03/22    Authorization - Visit Number 5    Progress Note Due on Visit 10    PT Start Time 1005    PT Stop Time 1043    PT Time Calculation (min) 38 min    Activity Tolerance Patient tolerated treatment well    Behavior During Therapy [facility] for tasks assessed/performed                 Past Medical History:  Diagnosis Date   Anemia of chronic renal disease    Arthritis    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 b.)07/18/2006 -endovas oblit complex ACOM aneur. c.) Endovas Tx of 6.8x4mm67mCA aneur. d.) 3.5x3mm 76mnant RMCA aneur 2/2 coil compaction. e.) Interval 3.2x3.1mm s57mular outpouching in ACOM c/w mild recannulization in neck. 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 g.) 3.7x2mm re29mnt of prev Tx'd ACOM aneur -failed embol 02/24/2012.   CKD (chronic kidney disease), stage IV (HCC)    a.) solitary functioning kidney on the RIGHT   Complication of anesthesia    a.) delayed emergence   Coronary artery disease 02/23/2007   a.) LHC 02/23/2007 --> EF 50%; 30% pLAD, 70% mRCA --> planned for staged PCI. b.) PCI 02/27/2007: EF 60%; 3.5 x 15 mm Vision BMS to 80% mRCA. b.) LHC 07/24/2007: EF 60%; minor irregs; no occlusive CAD; no intervention. c.) LHC 10/28/2010: EF 60%; 30% mLAD, LCx with minor luminal irregs, 20% ISR mRCA; no intervention. d.) LHC 05/31/2013: EF 60%; 40% mLAD, 30% ISR m-dRCA; no interventions.   DDD (degenerative disc disease), cervical    a.) s/p ACDF C5-C7; hardware in neck; patient appreciates stiffness and issues with mobility    Diastolic dysfunction    a.)  TTE 07/07/2021: EF 87.8%; normal PASP; trace TR/MR; G1DD.   Elevated hemidiaphragm    a.) LEFT   Headache(784.0)    Heart murmur    HLD (hyperlipidemia)    HOH (hard of hearing)    Has hearing aids, doesn't wear   Hyperparathyroidism due to renal insufficiency (HCC)    Hypertension    Incomplete right bundle branch block (RBBB)    Insomnia    a.) takes zolpidem   Long term (current) use of immunomodulator    a.) on DMARD therapy (hydroxychloriquine) for RA/SLE Dx.   Long term current use of antithrombotics/antiplatelets    a.) clopidogrel   Multiple acquired cysts of kidney    OSA (obstructive sleep apnea)    a.) does NOT use nocturnal pap therapy   Pancreatic cyst    Pneumonia    RBBB (right bundle branch block)    Rheumatoid arthritis (HCC)   [place]) on DMARD; hydroxychloriquine   Systemic lupus erythematosus (HCC)   ComoDM (type 2 diabetes mellitus) (HCC)   [place] dentures    partial lower   Past Surgical History:  Procedure Laterality Date   ANEURYSM COILING  08/2013   Attempted embolization of previously treated ACOM aneurysm; unsuccessful N/A 01/28/2012   [place] [facility]  BARTHOLIN CYST MARSUPIALIZATION     BICEPT TENODESIS Right 03/02/2022   Procedure: BICEPS TENODESIS;  Surgeon: Corky Mull, MD;  Location: [facility];  Service: Orthopedics;  Laterality: Right;   BREAST CYST ASPIRATION Right 01/25/2012   FNA neg.   BREAST EXCISIONAL BIOPSY Left 06/26/2007   neg   BUNIONECTOMY Bilateral 2022   CARDIAC CATHETERIZATION Left 02/23/2007   Procedure: CARDIAC CATHETERIZATION; Location: [place]; Surgeon: Neoma Laming, MD   CARDIAC CATHETERIZATION Left 07/31/2007   Procedure: CARDIAC CATHETERIZATION; Location: [place]; Surgeon: Neoma Laming, MD   CARDIAC CATHETERIZATION Left 10/28/2010   Procedure: CARDIAC CATHETERIZATION; Location: [place]; Surgeon: Neoma Laming, MD   CARDIAC CATHETERIZATION Left 05/31/2013    Procedure: CARDIAC CATHETERIZATION; Location: [place]; Surgeon: Neoma Laming, MD   Carotid arteriogram; 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 N/A 07/18/2006   Location: [facility]   CATARACT EXTRACTION     CERVICAL FUSION N/A    Procedure: ACDF C5-C7   COLONOSCOPY WITH PROPOFOL N/A 10/04/2019   Procedure: COLONOSCOPY WITH BIOPSY;  Surgeon: Lin Landsman, MD;  Location: [place];  Service: Endoscopy;  Laterality: N/A;  Diabetic (borderline) - oral meds priority 3   CORONARY STENT INTERVENTION Left 02/27/2007   Procedure: CORONARY STENT INTERVENTION (3.5 x 15 mm Vision BMS to mRCA); Location: [place]; Surgeon: Kathlyn Sacramento, MD   Endovascular near complete obliteration of enlarging neck remnant of previously treated RIGHT MCA aneurysm using stent assisted coiling N/A 09/03/2010   Location: [facility]   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 N/A 10/03/2006   Location: [facility]   HYSTEROSCOPY WITH D & C N/A 08/04/2021   Procedure: DILATATION AND CURETTAGE /HYSTEROSCOPY;  Surgeon: Homero Fellers, MD;  Location: [facility];  Service: Gynecology;  Laterality: N/A;   POLYPECTOMY N/A 10/04/2019   Procedure: POLYPECTOMY;  Surgeon: Lin Landsman, MD;  Location: [place];  Service: Endoscopy;  Laterality: N/A;   REVERSE SHOULDER ARTHROPLASTY Right 03/02/2022   Procedure: REVERSE SHOULDER ARTHROPLASTY;  Surgeon: Corky Mull, MD;  Location: [facility];  Service: Orthopedics;  Laterality: Right;   UPPER ESOPHAGEAL ENDOSCOPIC ULTRASOUND (EUS) N/A 04/15/2016   Procedure: UPPER ESOPHAGEAL ENDOSCOPIC ULTRASOUND (EUS);  Surgeon: Holly Bodily, MD;  Location: [facility];  Service: Gastroenterology;  Laterality: N/A;   Patient Active Problem List   Diagnosis Date Noted   Status post reverse total shoulder replacement, 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 "I took a pill before I came in today, so I hope it helps. My back is a 6/10 and both knees are 8/10 due to my arthritis.   PERTINENT HISTORY:  Pt is a 77 year old female presenting with with chronic LBP with most recent flare up "about a year ago with insideous onset". PMH of posterior decompression and posterior spinal fusion at L4-L5. Pt reports bilat LE pain and n/t down BLE but the pain "takes turns". Current and best pain 5/10; worst 10/10. Has most pain in the morning when she wakes up, walking >63mns, standing >567ms (to cook), lifting, carrying, stair ascent. Pt is retired, enjoys watching TV, "because there is not very much I can do". Before her back pain flared up she enjoyed cooking, gardened, and enjoyed walking. Pt reports having SPC that she uses when she has increased pain, and adaptive bathroom in her apartment (that she lives in alone). Has had 1 fall in the past 6 months in the shower while trying to shave her legs. Does endorse feeling off balance, that her "feet do things on their own", denies neuropathy or diabetes. Pt denies N/V, B&B changes, unexplained weight fluctuation, saddle paresthesia, fever, night sweats, or unrelenting night pain at this time.   PAIN:  Are you having pain?  Yes: NPRS scale: 5/10 Pain location: across low back; "random radiating pain into Les" Pain description: dull ache Aggravating factors: morning when she wakes up, walking >73m41m, lifting, carrying Relieving factors: gabapentin, rest  PRECAUTIONS: None  WEIGHT BEARING RESTRICTIONS: No  FALLS:  Has patient fallen in last 6 months? Yes. Number of falls 1  OCCUPATION: retired  PLOF: Independent with basic ADLs  PATIENT GOALS: decrease pain to be able to walk again   OBJECTIVE:   DIAGNOSTIC FINDINGS:  MRI 10/02/21  Interval posterior decompression and posterior spinal fusion at L4-L5. Multifactorial mild-to-moderate spinal canal narrowing at the level of the L4 inferior endplate has improved. Endplate osteophytes contribute to moderate right neural foraminal narrowing at this level.  Additional lumbar and lower thoracic spondylosis, as outlined and having progressed at multiple levels since the prior MRI of 02/26/2006. Findings are most notably as follows.  At L3-L4, there is progressive multifactorial severe central canal and left subarticular stenosis, and progressive moderate right subarticular stenosis. Progressive moderate bilateral neural foraminal narrowing at this level. Also of note, facet arthrosis is advanced at this level and bilateral facet joint effusions are present.  No more than mild relative narrowing of the central canal at the remaining levels. Additional sites of subarticular stenosis, as detailed and greatest on the left at L5-S1 (moderate at this site with crowding of the descending left S1 nerve root).  Progressive advanced disc degeneration at L1-L2.     TODAY'S TREATMENT:  DATE: 07/20/22   -Nustep, Seat 8 AA/ROM, legs only level 2 x 5 minutes for gentle lumbar mobility and bilat LE strengthening  Hooklying alt trunk  rotations x20 with cuing to maintain spine contact to mat table  SKTC 30sec bilat in hooklying; with CLLE ext x30 sec bilat Hooklying knee fall outs x20 Prone prop on elbows 30sec; 2 x12 Following No LBP with L glute pain reported 5/10   Squat to very elevated mat table (for TC of post hip movement) with BUE flex 90d 2x 10 consistent cuing for technique with some carry over Seated alt marching x20 BUE flex to 90d for target with consistent cuing for core activation  Squat to very elevated mat table BUE 90d flex x10 Seated alt marching x20 BUE flex to 90d  Seated theraball forward rolls x12 with cuing for breath control with good carry over Seated childs pose 30sec     PATIENT EDUCATION:  Education details: Patient was educated on diagnosis, anatomy and pathology involved, prognosis, role of PT, and was given an HEP, demonstrating exercise with proper form following verbal and tactile cues, and was given a paper hand out to continue exercise at home. Pt was educated on and agreed to plan of care.  Person educated: Patient Education method: Explanation, Demonstration, and Handouts Education comprehension: verbalized understanding, returned demonstration, and verbal cues required  HOME EXERCISE PROGRAM: Access Code: [email or web address]  ASSESSMENT:  CLINICAL IMPRESSION: PT continued therex progression for functional hip and core strengthening, and lumbar pain reduction with success. Pt is able to comply with all cuing for proper technique of therex wit minimal increased pain throughout session. Patient with cuing for glute and core activation throughout exercises with decent carry over of multimodal cuing.  Patient with good motivation throughout session. PT will continue progression as able.    OBJECTIVE IMPAIRMENTS: Abnormal gait, decreased activity tolerance, decreased balance, decreased coordination, decreased endurance, decreased mobility, difficulty walking, decreased ROM, decreased  strength, increased fascial restrictions, impaired flexibility, impaired tone, improper body mechanics, postural dysfunction, obesity, and pain.   ACTIVITY LIMITATIONS: carrying, lifting, bending, sitting, standing, squatting, stairs, transfers, bathing, hygiene/grooming, and locomotion level  PARTICIPATION LIMITATIONS: meal prep, cleaning, laundry, driving, community activity, and yard work  PERSONAL FACTORS: Age, Fitness, Past/current experiences, Time since onset of injury/illness/exacerbation, and 3+ comorbidities: HTN, HLD, DDD, OA, CKD, CAD, DM2, RA  are also affecting patient's functional outcome.   REHAB POTENTIAL: Fair Several comorbidities and chronicity of pain affecting potential  CLINICAL DECISION MAKING: Evolving/moderate complexity  EVALUATION COMPLEXITY: Moderate   GOALS: Goals reviewed with patient? Yes  SHORT TERM GOALS: Target date: 07/01/22  Pt will be independent with HEP in order to improve strength and balance in order to decrease fall risk and improve function at home and work.  Baseline:07/01/22 HEP given  Goal status: INITIAL    LONG TERM GOALS: Target date: 08/27/22  Patient will increase FOTO score to 46 to demonstrate predicted increase in functional mobility to complete ADLs  Baseline: 29 Goal status: INITIAL  2.  Pt will decrease 5TSTS of 15seconds or less in order to demonstrate  age-matched norm in LE strength to demonstrate decreased risk of recurrent falls  Baseline: 38sec Goal status: INITIAL  3.  Pt will increase 10MWT to at least 0.106ms in order to demonstrate safe walk speed needed for household ambulation  Baseline: self selected: 0.563m fastest: 0.5660mGoal status: INITIAL  4.  Pt will decrease worst pain as reported on NPRS by at least 3 points in  order to demonstrate clinically significant reduction in pain.  Baseline: 10/10 Goal status: INITIAL    PLAN:  PT FREQUENCY: 1-2x/week  PT DURATION: 8 weeks  PLANNED  INTERVENTIONS: Therapeutic exercises, Therapeutic activity, Neuromuscular re-education, Balance training, Gait training, Patient/Family education, Self Care, Joint mobilization, Joint manipulation, Stair training, DME instructions, Aquatic Therapy, Dry Needling, Electrical stimulation, Spinal manipulation, Spinal mobilization, Cryotherapy, Moist heat, Traction, Ultrasound, Ionotophoresis '4mg'$ /ml Dexamethasone, Manual therapy, and Re-evaluation.  PLAN FOR NEXT SESSION: interventions to reduce rigid lumbar lordosis improve flexion ROM, core flexion strength, standing postural retraining BLE strength    Durwin Reges DPT   Durwin Reges, PT 07/20/2022, 11:27 AM

## 2022-07-21 ENCOUNTER — Ambulatory Visit: Payer: Self-pay | Admitting: *Deleted

## 2022-07-21 NOTE — Patient Instructions (Signed)
Visit Information  Thank you for taking time to visit with me today. Please don't hesitate to contact me if I can be of assistance to you.   Following are the goals we discussed today:   Goals Addressed             This Visit's Progress    In home care needs       Care Coordination Interventions: Follow up phone call from patient regarding  paperwork for [employer] Patients states that she did call [employer] and confirmed that application remains in review-determination to be made by 08/12/22 Patient confirmed that her daughter continues to assist patient with her care needs while determination is made [employer] to contact patient by phone and letter once a determination is made Patient verbalized having no additional needs at this time , patient to contact this social worker with any additional needs           Please call the care guide team at [phone] if you need to cancel or reschedule your appointment.   If you are experiencing a Mental Health or [employer] [facility] or need someone to talk to, please call 911   Patient verbalizes understanding of instructions and care plan provided today and agrees to view in [place]. Active MyChart status and patient understanding of how to access instructions and care plan via MyChart confirmed with patient.     No further follow up required: patient to contact this Education officer, museum with any additional community resource needs  [employer], [place] Worker  [facility] Care Management [phone]

## 2022-07-21 NOTE — Patient Outreach (Signed)
  Care Coordination   Follow Up Visit Note   07/21/2022 Name: [name] MRN: [id number] DOB: [birth year]  [name] is a 77 y.o. year old female who sees Perrin Maltese, MD for primary care. I spoke with  [name] by phone today.  What matters to the patients health and wellness today?  In home care    Goals Addressed             This Visit's Progress    In home care needs       Care Coordination Interventions: Follow up phone call from patient regarding  paperwork for CAPS Patients states that she did call [employer] and confirmed that application remains in review-determination to be made by 08/12/22 Patient confirmed that her daughter continues to assist patient with her care needs while determination is made [employer] to contact patient by phone and letter once a determination is made Patient verbalized having no additional needs at this time , patient to contact this social worker with any additional needs          SDOH assessments and interventions completed:  No     Care Coordination Interventions:  Yes, provided   Follow up plan: No further intervention required.   Encounter Outcome:  Pt. Visit Completed

## 2022-07-23 ENCOUNTER — Ambulatory Visit: Payer: 59 | Attending: Neurosurgery | Admitting: Physical Therapy

## 2022-07-23 DIAGNOSIS — M545 Low back pain, unspecified: Secondary | ICD-10-CM | POA: Insufficient documentation

## 2022-07-23 DIAGNOSIS — G8929 Other chronic pain: Secondary | ICD-10-CM | POA: Diagnosis not present

## 2022-07-23 DIAGNOSIS — R269 Unspecified abnormalities of gait and mobility: Secondary | ICD-10-CM | POA: Diagnosis not present

## 2022-07-23 DIAGNOSIS — R262 Difficulty in walking, not elsewhere classified: Secondary | ICD-10-CM | POA: Insufficient documentation

## 2022-07-23 DIAGNOSIS — R293 Abnormal posture: Secondary | ICD-10-CM | POA: Diagnosis not present

## 2022-07-23 NOTE — Therapy (Signed)
OUTPATIENT PHYSICAL THERAPY TREATMENT   Patient Name: [name] MRN: [id number] DOB:August 20, 1945, 77 y.o., female Today's Date: 07/23/2022  END OF SESSION:  PT End of Session - 07/23/22 1059     Visit Number 6    Number of Visits 17    Date for PT Re-Evaluation 08/27/22    PT Start Time 6387    PT Stop Time 1045    PT Time Calculation (min) 43 min    Activity Tolerance Patient tolerated treatment well    Behavior During Therapy [facility] for tasks assessed/performed                  Past Medical History:  Diagnosis Date   Anemia of chronic renal disease    Arthritis    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 b.)07/18/2006 -endovas oblit complex ACOM aneur. c.) Endovas Tx of 6.8x4mm42mCA aneur. d.) 3.5x3mm 68mnant RMCA aneur 2/2 coil compaction. e.) Interval 3.2x3.1mm s41mular outpouching in ACOM c/w mild recannulization in neck. 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 g.) 3.7x2mm re81mnt of prev Tx'd ACOM aneur -failed embol 02/24/2012.   CKD (chronic kidney disease), stage IV (HCC)    a.) solitary functioning kidney on the RIGHT   Complication of anesthesia    a.) delayed emergence   Coronary artery disease 02/23/2007   a.) LHC 02/23/2007 --> EF 50%; 30% pLAD, 70% mRCA --> planned for staged PCI. b.) PCI 02/27/2007: EF 60%; 3.5 x 15 mm Vision BMS to 80% mRCA. b.) LHC 07/24/2007: EF 60%; minor irregs; no occlusive CAD; no intervention. c.) LHC 10/28/2010: EF 60%; 30% mLAD, LCx with minor luminal irregs, 20% ISR mRCA; no intervention. d.) LHC 05/31/2013: EF 60%; 40% mLAD, 30% ISR m-dRCA; no interventions.   DDD (degenerative disc disease), cervical    a.) s/p ACDF C5-C7; hardware in neck; patient appreciates stiffness and issues with mobility   Diastolic dysfunction    a.)  TTE 07/07/2021: EF 87.8%; normal PASP; trace TR/MR; G1DD.   Elevated hemidiaphragm    a.) LEFT   Headache(784.0)    Heart murmur    HLD  (hyperlipidemia)    HOH (hard of hearing)    Has hearing aids, doesn't wear   Hyperparathyroidism due to renal insufficiency (HCC)    Hypertension    Incomplete right bundle branch block (RBBB)    Insomnia    a.) takes zolpidem   Long term (current) use of immunomodulator    a.) on DMARD therapy (hydroxychloriquine) for RA/SLE Dx.   Long term current use of antithrombotics/antiplatelets    a.) clopidogrel   Multiple acquired cysts of kidney    OSA (obstructive sleep apnea)    a.) does NOT use nocturnal pap therapy   Pancreatic cyst    Pneumonia    RBBB (right bundle branch block)    Rheumatoid arthritis (HCC)   [place]) on DMARD; hydroxychloriquine   Systemic lupus erythematosus (HCC)   RobertsDM (type 2 diabetes mellitus) (HCC)   [place] dentures    partial lower   Past Surgical History:  Procedure Laterality Date   ANEURYSM COILING  08/2013   Attempted embolization of previously treated ACOM aneurysm; unsuccessful N/A 01/28/2012   [facility] [place]     BICEPT TENODESIS Right 03/02/2022   Procedure: BICEPS TENODESIS;  Surgeon: Poggi, Corky MullLocation: [facility];  Service: Orthopedics;  Laterality:  Right;   BREAST CYST ASPIRATION Right 01/25/2012   FNA neg.   BREAST EXCISIONAL BIOPSY Left 06/26/2007   neg   BUNIONECTOMY Bilateral 2022   CARDIAC CATHETERIZATION Left 02/23/2007   Procedure: CARDIAC CATHETERIZATION; Location: [place]; Surgeon: Neoma Laming, MD   CARDIAC CATHETERIZATION Left 07/31/2007   Procedure: CARDIAC CATHETERIZATION; Location: [place]; Surgeon: Neoma Laming, MD   CARDIAC CATHETERIZATION Left 10/28/2010   Procedure: CARDIAC CATHETERIZATION; Location: [place]; Surgeon: Neoma Laming, MD   CARDIAC CATHETERIZATION Left 05/31/2013   Procedure: CARDIAC CATHETERIZATION; Location: [place]; Surgeon: Neoma Laming, MD   Carotid arteriogram; 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 N/A  07/18/2006   Location: [facility]   CATARACT EXTRACTION     CERVICAL FUSION N/A    Procedure: ACDF C5-C7   COLONOSCOPY WITH PROPOFOL N/A 10/04/2019   Procedure: COLONOSCOPY WITH BIOPSY;  Surgeon: Lin Landsman, MD;  Location: [place];  Service: Endoscopy;  Laterality: N/A;  Diabetic (borderline) - oral meds priority 3   CORONARY STENT INTERVENTION Left 02/27/2007   Procedure: CORONARY STENT INTERVENTION (3.5 x 15 mm Vision BMS to mRCA); Location: [place]; Surgeon: Kathlyn Sacramento, MD   Endovascular near complete obliteration of enlarging neck remnant of previously treated RIGHT MCA aneurysm using stent assisted coiling N/A 09/03/2010   [facility]   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 N/A 10/03/2006   Location: [facility]   HYSTEROSCOPY WITH D & C N/A 08/04/2021   Procedure: DILATATION AND CURETTAGE /HYSTEROSCOPY;  Surgeon: Homero Fellers, MD;  Location: [facility];  Service: Gynecology;  Laterality: N/A;   POLYPECTOMY N/A 10/04/2019   Procedure: POLYPECTOMY;  Surgeon: Lin Landsman, MD;  Location: [place];  Service: Endoscopy;  Laterality: N/A;   REVERSE SHOULDER ARTHROPLASTY Right 03/02/2022   Procedure: REVERSE SHOULDER ARTHROPLASTY;  Surgeon: Corky Mull, MD;  Location: [facility];  Service: Orthopedics;  Laterality: Right;   UPPER ESOPHAGEAL ENDOSCOPIC ULTRASOUND (EUS) N/A 04/15/2016   Procedure: UPPER ESOPHAGEAL ENDOSCOPIC ULTRASOUND (EUS);  Surgeon: Holly Bodily, MD;  Location: [facility];  Service: Gastroenterology;  Laterality: N/A;   Patient Active Problem List   Diagnosis Date Noted   Status post reverse total shoulder replacement, 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 Her knees are bothering her due to arthritis. No changes in medications since the last session. She is excited for Spring to come. NPS: 2/10 currently.   PERTINENT HISTORY:  Pt is a 77 year old female presenting with with chronic LBP with most recent flare up "about a year ago with insideous onset". PMH of posterior decompression and posterior spinal fusion at L4-L5. Pt reports bilat LE pain and n/t down BLE but the pain "takes turns". Current and best pain 5/10; worst 10/10. Has most pain in the morning when she wakes up, walking >77mns, standing >53ms (to cook), lifting, carrying, stair ascent. Pt is retired, enjoys watching TV, "because there is not very much I can do". Before her back pain flared up she enjoyed cooking, gardened, and enjoyed walking. Pt reports having SPC that she uses when she has increased pain, and adaptive bathroom in her apartment (that she lives in alone). Has had 1 fall in the past 6 months in the shower while trying to shave her legs. Does endorse feeling off balance, that her "feet do things on their own", denies neuropathy or diabetes. Pt denies N/V, B&B changes, unexplained weight fluctuation, saddle paresthesia, fever, night sweats, or unrelenting night pain at this time.   PAIN:  Are you having pain? Yes: NPRS scale: 5/10 Pain location: across low back; "random radiating pain into Les" Pain description: dull ache Aggravating factors: morning  when she wakes up, walking >46m71m, lifting, carrying Relieving factors: gabapentin, rest  PRECAUTIONS: None  WEIGHT BEARING RESTRICTIONS: No  FALLS:  Has patient fallen in last 6 months? Yes. Number of falls 1  OCCUPATION: retired  PLOF: Independent with basic ADLs  PATIENT GOALS: decrease pain to be able to walk again   OBJECTIVE:   DIAGNOSTIC FINDINGS:  MRI 10/02/21  Interval posterior decompression and posterior spinal fusion at L4-L5. Multifactorial mild-to-moderate spinal canal narrowing at the level of the L4 inferior endplate has improved. Endplate osteophytes contribute to moderate right neural foraminal narrowing at this level.  Additional lumbar and lower thoracic spondylosis, as outlined and having progressed at multiple levels since the prior MRI of 02/26/2006. Findings are most notably as follows.  At L3-L4, there is progressive multifactorial severe central canal and left subarticular stenosis, and progressive moderate right subarticular stenosis. Progressive moderate bilateral neural foraminal narrowing at this level. Also of note, facet arthrosis is advanced at this level and bilateral facet joint effusions are present.  No more than mild relative narrowing of the central canal at the remaining levels. Additional sites of subarticular stenosis, as detailed and greatest on the left at L5-S1 (moderate at this site with crowding of the descending left S1 nerve root).  Progressive advanced disc degeneration at L1-L2.     TODAY'S TREATMENT:  DATE: 07/23/22  -Nustep, Seat 8 AA/ROM, legs only level 2 x 5 minutes for gentle lumbar mobility and bilat LE strengthening - Hooklying alt trunk rotations x 20 with good carry over  - Bridge x 12 - SLR with 2#  x 6 - Bird dog x 6 (isolating each arm and leg) - STS with ball toss x 12 - Side steps  walk outs with YTB for 40 ft  - Hip 3-way with YTB x 6 (bilaterally)  Last session: 07/21/22 Hooklying alt trunk rotations x20 with cuing to maintain spine contact to mat table  SKTC 30sec bilat in hooklying; with CLLE ext x30 sec bilat Hooklying knee fall outs x20 Prone prop on elbows 30sec; 2 x12 Following No LBP with L glute pain reported 5/10   Squat to very elevated mat table (for TC of post hip movement) with BUE flex 90d 2x 10 consistent cuing for technique with some carry over Seated alt marching x20 BUE flex to 90d for target with consistent cuing for core activation  Squat to very elevated mat table BUE 90d flex x10 Seated alt marching x20 BUE flex to 90d  Seated theraball forward rolls x12 with cuing for breath control with good carry over Seated childs pose 30sec     PATIENT EDUCATION:  Education details: Patient was educated on diagnosis, anatomy and pathology involved, prognosis, role of PT, and was given an HEP, demonstrating exercise with proper form following verbal and tactile cues, and was given a paper hand out to continue exercise at home. Pt was educated on and agreed to plan of care.  Person educated: Patient Education method: Explanation, Demonstration, and Handouts Education comprehension: verbalized understanding, returned demonstration, and verbal cues required  HOME EXERCISE PROGRAM: Access Code: [email or web address]  ASSESSMENT:  CLINICAL IMPRESSION: PT continued therex progression for functional hip and core strengthening with success. Pt exhibited no increase in lumbar or hip pain for the entirety of the session. Pt is able to comply with demonstrations, VC, and TC for proper form and technique of therex. Patient with cuing for glute and core activation throughout exercises with decent carry over of multimodal cuing. Pt had muscle fatigue but continued to put forth good effort as the session went on. Pt will continue to benefit from skilled PT to decrease pain and  get back to PLOF and improve QoL.  OBJECTIVE IMPAIRMENTS: Abnormal gait, decreased activity tolerance, decreased balance, decreased coordination, decreased endurance, decreased mobility, difficulty walking, decreased ROM, decreased strength, increased fascial restrictions, impaired flexibility, impaired tone, improper body mechanics, postural dysfunction, obesity, and pain.   ACTIVITY LIMITATIONS: carrying, lifting, bending, sitting, standing, squatting, stairs, transfers, bathing, hygiene/grooming, and locomotion level  PARTICIPATION LIMITATIONS: meal prep, cleaning, laundry, driving, community activity, and yard work  PERSONAL FACTORS: Age, Fitness, Past/current experiences, Time since onset of injury/illness/exacerbation, and 3+ comorbidities: HTN, HLD, DDD, OA, CKD, CAD, DM2, RA  are also affecting patient's functional outcome.   REHAB POTENTIAL: Fair Several comorbidities and chronicity of pain affecting potential  CLINICAL DECISION MAKING: Evolving/moderate complexity  EVALUATION COMPLEXITY: Moderate   GOALS: Goals reviewed with patient? Yes  SHORT TERM GOALS: Target date: 07/01/22  Pt will be independent with HEP in order to improve strength and balance in order to decrease fall risk and improve function at home and work.  Baseline:07/01/22 HEP given  Goal status: INITIAL    LONG TERM GOALS: Target date: 08/27/22  Patient will increase FOTO score to 46 to demonstrate predicted increase in functional mobility to complete ADLs  Baseline: 29 Goal status: INITIAL  2.  Pt will decrease 5TSTS of 15seconds or less in order to demonstrate  age-matched norm in LE strength to demonstrate decreased risk of recurrent falls  Baseline: 38sec Goal status: INITIAL  3.  Pt will increase 10MWT to at least 0.44ms in order to demonstrate safe walk speed needed for household ambulation  Baseline: self selected: 0.534m fastest: 0.5671mGoal status: INITIAL  4.  Pt will decrease worst pain  as reported on NPRS by at least 3 points in order to demonstrate clinically significant reduction in pain.  Baseline: 10/10 Goal status: INITIAL    PLAN:  PT FREQUENCY: 1-2x/week  PT DURATION: 8 weeks  PLANNED INTERVENTIONS: Therapeutic exercises, Therapeutic activity, Neuromuscular re-education, Balance training, Gait training, Patient/Family education, Self Care, Joint mobilization, Joint manipulation, Stair training, DME instructions, Aquatic Therapy, Dry Needling, Electrical stimulation, Spinal manipulation, Spinal mobilization, Cryotherapy, Moist heat, Traction, Ultrasound, Ionotophoresis '4mg'$ /ml Dexamethasone, Manual therapy, and Re-evaluation.  PLAN FOR NEXT SESSION: interventions to reduce rigid lumbar lordosis improve flexion ROM, core flexion strength, standing postural retraining BLE strength    CheDurwin RegesT   CheDurwin RegesT 07/23/2022, 11:11 AM

## 2022-07-26 ENCOUNTER — Ambulatory Visit: Payer: 59 | Admitting: Physical Therapy

## 2022-07-26 ENCOUNTER — Ambulatory Visit: Payer: Self-pay

## 2022-07-26 NOTE — Patient Outreach (Signed)
  Care Coordination   Follow Up Visit Note   07/26/2022 Name: [name] MRN: [id number] DOB: 06/09/1946  [name] is a 77 y.o. year old female who sees Perrin Maltese, MD for primary care. I spoke with  [name] by phone today.  What matters to the patients health and wellness today?  Management of shoulder/ back pain    Goals Addressed             This Visit's Progress    Patient Stated:  " I want to manage my arthritis pain"       Care Coordination Interventions: Evaluation of current treatment plan related to arthritis  and patient's adherence to plan as established by provider:  Patient states she is currently undergoing physical therapy for her shoulder and back. She states the doctor wanted her to try physical therapy first then re-evaluate for surgery if therapy does not help.   Patient states her pain level is a zero at this time due to having her pain medication.  She states once the pain medicine wears off or the effects of therapy wear off the pain always comes back.  Patient confirmed she has transportation to her doctor appointments now. Patient reports ongoing follow up with social worker for CAPS program.  Reviewed medications with patient and discussed importance of compliance Reviewed scheduled/upcoming provider appointments:  Patient scheduled for follow up visit with orthopedic surgeon on 08/12/22.   Discussed plans with patient for ongoing care management follow up:  Patient verbally agreed to next telephone follow up with RNCM on 08/13/22.  Discussed pain management treatments for home:  Heat/ ice applications, encouraged patient to do recommended exercises/ stretches as instructed by physical therapist, and continue to take pain medications as prescribed by provider.          SDOH assessments and interventions completed:  No     Care Coordination Interventions:  Yes, provided  Interventions Today    Flowsheet Row Most Recent  Value  Chronic Disease Discussed/Reviewed   Chronic disease discussed/reviewed during today's visit Other  [chronic pain]  General Interventions   General Interventions Discussed/Reviewed General Interventions Reviewed  Exercise Interventions   Exercise Discussed/Reviewed Physical Activity  [encouraged to do exercises/ stretches as recommended by physical therapist.]  Education Interventions   Education Provided Provided Verbal Education  Provided Verbal Education On --  [pain management: applying hot/ cold, taking medication as prescribed]        Follow up plan: Follow up call scheduled for 08/13/22    Encounter Outcome:  Pt. Visit Completed   Quinn Plowman RN,BSN,CCM [place] [phone] direct line

## 2022-07-30 ENCOUNTER — Encounter: Payer: Self-pay | Admitting: Physical Therapy

## 2022-07-30 ENCOUNTER — Ambulatory Visit: Payer: 59 | Admitting: Physical Therapy

## 2022-07-30 DIAGNOSIS — G8929 Other chronic pain: Secondary | ICD-10-CM | POA: Diagnosis not present

## 2022-07-30 DIAGNOSIS — R293 Abnormal posture: Secondary | ICD-10-CM | POA: Diagnosis not present

## 2022-07-30 DIAGNOSIS — R269 Unspecified abnormalities of gait and mobility: Secondary | ICD-10-CM | POA: Diagnosis not present

## 2022-07-30 DIAGNOSIS — R262 Difficulty in walking, not elsewhere classified: Secondary | ICD-10-CM | POA: Diagnosis not present

## 2022-07-30 DIAGNOSIS — M545 Low back pain, unspecified: Secondary | ICD-10-CM | POA: Diagnosis not present

## 2022-07-30 NOTE — Therapy (Unsigned)
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 b.)07/18/2006 -endovas oblit complex ACOM aneur. c.) Endovas Tx of 6.8x4mm19mCA aneur. d.) 3.5x3mm 22mnant RMCA aneur 2/2 coil compaction. e.) Interval 3.2x3.1mm s36mular outpouching in ACOM c/w mild recannulization in neck. 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 g.) 3.7x2mm re72mnt of prev Tx'd ACOM aneur -failed embol 02/24/2012.   CKD (chronic kidney disease), stage IV (HCC)    a.) solitary functioning kidney on the RIGHT   Complication of anesthesia    a.) delayed emergence   Coronary artery disease 02/23/2007   a.) LHC 02/23/2007 --> EF 50%; 30% pLAD, 70% mRCA --> planned for staged PCI. b.) PCI 02/27/2007: EF 60%; 3.5 x 15 mm Vision BMS to 80% mRCA. b.) LHC 07/24/2007: EF 60%; minor irregs; no occlusive CAD; no intervention. c.) LHC 10/28/2010: EF 60%; 30% mLAD, LCx with minor luminal irregs, 20% ISR mRCA; no intervention. d.) LHC 05/31/2013: EF 60%; 40% mLAD, 30% ISR m-dRCA; no interventions.   DDD (degenerative disc disease), cervical    a.) s/p ACDF C5-C7; hardware in neck; patient appreciates stiffness and issues with mobility   Diastolic dysfunction    a.)  TTE 07/07/2021: EF 87.8%; normal PASP; trace TR/MR; G1DD.   Elevated hemidiaphragm    a.) LEFT   Headache(784.0)    Heart murmur    HLD  (hyperlipidemia)    HOH (hard of hearing)    Has hearing aids, doesn't wear   Hyperparathyroidism due to renal insufficiency (HCC)    Hypertension    Incomplete right bundle branch block (RBBB)    Insomnia    a.) takes zolpidem   Long term (current) use of immunomodulator    a.) on DMARD therapy (hydroxychloriquine) for RA/SLE Dx.   Long term current use of antithrombotics/antiplatelets    a.) clopidogrel   Multiple acquired cysts of kidney    OSA (obstructive sleep apnea)    a.) does NOT use nocturnal pap therapy   Pancreatic cyst    Pneumonia    RBBB (right bundle branch block)    Rheumatoid arthritis (HCC)   [place]) on DMARD; hydroxychloriquine   Systemic lupus erythematosus (HCC)   [place] (type 2 diabetes mellitus) (HCC)   [place] dent[place]    partial lower   Past Surgical History:  Procedure Laterality Date   ANEURYSM COILING  08/2013   Attempted embolization of previously treated ACOM aneurysm; unsuccessful N/A 01/28/2012   [facility] [place]     BICEPT TENODESIS Right 03/02/2022   Procedure: BICEPS TENODESIS;  Surgeon: Poggi, Corky MullLocation[facility];  Service: Orthopedics;  Laterality: Right;   BREAST CYST ASPIRATION Right 01/25/2012   FNA neg.   BREAST EXCISIONAL BIOPSY Left 06/26/2007   neg   BUNIONECTOMY Bilateral 2022   CARDIAC CATHETERIZATION Left 02/23/2007   Procedure: CARDIAC CATHETERIZATION; Location: [place]; Surgeon: Neoma Laming, MD   CARDIAC CATHETERIZATION Left 07/31/2007   Procedure: CARDIAC CATHETERIZATION; Location: [place]; Surgeon: Neoma Laming, MD   CARDIAC CATHETERIZATION Left 10/28/2010   Procedure: CARDIAC CATHETERIZATION; Location: [place]; Surgeon: Neoma Laming, MD   CARDIAC CATHETERIZATION Left 05/31/2013   Procedure: CARDIAC CATHETERIZATION; Location: [place]; Surgeon: Neoma Laming, MD   Carotid arteriogram; 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 N/A  07/18/2006   Location[facility]   CATARACT EXTRACTION     CERVICAL FUSION N/A    Procedure: ACDF C5-C7   COLONOSCOPY WITH PROPOFOL N/A 10/04/2019   Procedure: COLONOSCOPY WITH BIOPSY;  Surgeon: Lin Landsman, MD;  Location: [place];  Service: Endoscopy;  Laterality: N/A;  Diabetic (borderline) - oral meds priority 3   CORONARY STENT INTERVENTION Left 02/27/2007   Procedure: CORONARY STENT INTERVENTION (3.5 x 15 mm Vision BMS to mRCA); Location: [place]; Surgeon: Kathlyn Sacramento, MD   Endovascular near complete obliteration of enlarging neck remnant of previously treated RIGHT MCA aneurysm using stent assisted coiling N/A 09/03/2010   Location: [facility]   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 N/A 10/03/2006   Location: [facility]   HYSTEROSCOPY WITH D & C N/A 08/04/2021   Procedure: DILATATION AND CURETTAGE /HYSTEROSCOPY;  Surgeon: Homero Fellers, MD;  Location: [facility];  Service: Gynecology;  Laterality: N/A;   POLYPECTOMY N/A 10/04/2019   Procedure: POLYPECTOMY;  Surgeon: Lin Landsman, MD;  Location: [place];  Service: Endoscopy;  Laterality: N/A;   REVERSE SHOULDER ARTHROPLASTY Right 03/02/2022   Procedure: REVERSE SHOULDER ARTHROPLASTY;  Surgeon: Corky Mull, MD;  Location: [facility];  Service: Orthopedics;  Laterality: Right;   UPPER ESOPHAGEAL ENDOSCOPIC ULTRASOUND (EUS) N/A 04/15/2016   Procedure: UPPER ESOPHAGEAL ENDOSCOPIC ULTRASOUND (EUS);  Surgeon: Holly Bodily, MD;  [facility];  Service: Gastroenterology;  Laterality: N/A;   Patient Active Problem List   Diagnosis Date Noted   Status post reverse total shoulder replacement, 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 Back pain has continued to decrease since last session. She looking forward to doing some reading this weekend. NPS: 0/10 currently   PERTINENT HISTORY:  Pt is a 77 year old female presenting with with chronic LBP with most recent flare up "about a year ago with insideous onset". PMH of posterior decompression and posterior spinal fusion at L4-L5. Pt reports bilat LE pain and n/t down BLE but the pain "takes turns". Current and best pain 5/10; worst 10/10. Has most pain in the morning when she wakes up, walking >42mns, standing >572ms (to cook), lifting, carrying, stair ascent. Pt is retired, enjoys watching TV, "because there is not very much I can do". Before her back pain flared up she enjoyed cooking, gardened, and enjoyed walking. Pt reports having SPC that she uses when she has increased pain, and adaptive bathroom in her apartment (that she lives in alone). Has had 1 fall in the past 6 months in the shower while trying to shave her legs. Does endorse feeling off balance, that her "feet do things on their own", denies neuropathy or diabetes. Pt denies N/V, B&B changes, unexplained weight fluctuation, saddle paresthesia, fever, night sweats, or unrelenting night pain at this time.   PAIN:  Are you having pain? Yes: NPRS scale: 5/10 Pain location: across low back; "random radiating pain into Les" Pain description: dull ache Aggravating factors: morning when  she wakes up, walking >81m66m, lifting, carrying Relieving factors: gabapentin, rest  PRECAUTIONS: None  WEIGHT BEARING RESTRICTIONS: No  FALLS:  Has patient fallen in last 6 months? Yes. Number of falls 1  OCCUPATION: retired  PLOF: Independent with basic ADLs  PATIENT GOALS: decrease pain to be able to walk again   OBJECTIVE:   DIAGNOSTIC FINDINGS:  MRI 10/02/21  Interval posterior decompression and posterior spinal fusion at L4-L5. Multifactorial mild-to-moderate spinal canal narrowing at the level of the L4 inferior endplate has improved. Endplate osteophytes contribute to moderate right neural foraminal narrowing at this level.  Additional lumbar and lower thoracic spondylosis, as outlined and having progressed at multiple levels since the prior MRI of 02/26/2006. Findings are most notably as follows.  At L3-L4, there is progressive multifactorial severe central canal and left subarticular stenosis, and progressive moderate right subarticular stenosis. Progressive moderate bilateral neural foraminal narrowing at this level. Also of note, facet arthrosis is advanced at this level and bilateral facet joint effusions are present.  No more than mild relative narrowing of the central canal at the remaining levels. Additional sites of subarticular stenosis, as detailed and greatest on the left at L5-S1 (moderate at this site with crowding of the descending left S1 nerve root).  Progressive advanced disc degeneration at L1-L2.     TODAY'S TREATMENT:  DATE: 07/23/22  -Nustep, Seat 8 AA/ROM, legs only level 2 x 5 minutes for gentle lumbar mobility and bilat LE strengthening - Hooklying alt trunk rotations x 20  - Bridge x 12 - SLR with 3# x 12 - Bird dog x 3 rounds (isolating each arm and leg- bilaterally) - STS 2x x 10 - Green t-ball rollout (4 x 3  deep breaths)  Last session: - Side steps walk outs with YTB for 40 ft  - Hip 3-way with YTB x 6 (bilaterally)  Last session: 07/21/22 Hooklying alt trunk rotations x20 with cuing to maintain spine contact to mat table  SKTC 30sec bilat in hooklying; with CLLE ext x30 sec bilat Hooklying knee fall outs x20 Prone prop on elbows 30sec; 2 x12 Following No LBP with L glute pain reported 5/10   Squat to very elevated mat table (for TC of post hip movement) with BUE flex 90d 2x 10 consistent cuing for technique with some carry over Seated alt marching x20 BUE flex to 90d for target with consistent cuing for core activation  Squat to very elevated mat table BUE 90d flex x10 Seated alt marching x20 BUE flex to 90d  Seated theraball forward rolls x12 with cuing for breath control with good carry over Seated childs pose 30sec     PATIENT EDUCATION:  Education details: Patient was educated on diagnosis, anatomy and pathology involved, prognosis, role of PT, and was given an HEP, demonstrating exercise with proper form following verbal and tactile cues, and was given a paper hand out to continue exercise at home. Pt was educated on and agreed to plan of care.  Person educated: Patient Education method: Explanation, Demonstration, and Handouts Education comprehension: verbalized understanding, returned demonstration, and verbal cues required  HOME EXERCISE PROGRAM: Access Code: [email or web address]  ASSESSMENT:  CLINICAL IMPRESSION: PT continued therex progression for functional hip and core strengthening with success. Pt exhibited no increase in lumbar or hip pain for the entirety of the session. NPS: 0/10 for lumbar pain at the end of the session. Pt is able to comply with demonstrations, VC, and TC for proper form and technique of therex. Pt with cuing for glute and core activation throughout exercises with great carry over of multimodal cuing. Pt had muscle fatigue but continued to put forth good  effort as the session went on. Pt will continue to benefit from skilled PT to decrease pain and get back to PLOF and improve QoL.  OBJECTIVE IMPAIRMENTS: Abnormal gait, decreased activity tolerance, decreased balance, decreased coordination, decreased endurance, decreased mobility, difficulty walking, decreased ROM, decreased strength, increased fascial restrictions, impaired flexibility, impaired tone, improper body mechanics, postural dysfunction, obesity, and pain.   ACTIVITY LIMITATIONS: carrying, lifting, bending, sitting, standing, squatting, stairs, transfers, bathing, hygiene/grooming, and locomotion level  PARTICIPATION LIMITATIONS: meal prep, cleaning, laundry, driving, community activity, and yard work  PERSONAL FACTORS: Age, Fitness, Past/current experiences, Time since onset of injury/illness/exacerbation, and 3+ comorbidities: HTN, HLD, DDD, OA, CKD, CAD, DM2, RA  are also affecting patient's functional outcome.   REHAB POTENTIAL: Fair Several comorbidities and chronicity of pain affecting potential  CLINICAL DECISION MAKING: Evolving/moderate complexity  EVALUATION COMPLEXITY: Moderate   GOALS: Goals reviewed with patient? Yes  SHORT TERM GOALS: Target date: 07/01/22  Pt will be independent with HEP in order to improve strength and balance in order to decrease fall risk and improve function at home and work.  Baseline:07/01/22 HEP given  Goal status: INITIAL    LONG TERM GOALS: Target date: 08/27/22  Patient will increase FOTO score to 46 to demonstrate predicted increase in functional mobility to complete ADLs  Baseline: 29 Goal status: INITIAL  2.  Pt will decrease 5TSTS of 15seconds or less in order to demonstrate  age-matched norm in LE strength to demonstrate decreased risk of recurrent falls  Baseline: 38sec Goal status: INITIAL  3.  Pt will increase 10MWT to at least 0.77ms in order to demonstrate safe walk speed needed for household ambulation  Baseline:  self selected: 0.524m fastest: 0.5649mGoal status: INITIAL  4.  Pt will decrease worst pain as reported on NPRS by at least 3 points in order to demonstrate clinically significant reduction in pain.  Baseline: 10/10 Goal status: INITIAL    PLAN:  PT FREQUENCY: 1-2x/week  PT DURATION: 8 weeks  PLANNED INTERVENTIONS: Therapeutic exercises, Therapeutic activity, Neuromuscular re-education, Balance training, Gait training, Patient/Family education, Self Care, Joint mobilization, Joint manipulation, Stair training, DME instructions, Aquatic Therapy, Dry Needling, Electrical stimulation, Spinal manipulation, Spinal mobilization, Cryotherapy, Moist heat, Traction, Ultrasound, Ionotophoresis 4mg26m Dexamethasone, Manual therapy, and Re-evaluation.  PLAN FOR NEXT SESSION: interventions to reduce rigid lumbar lordosis improve flexion ROM, core flexion strength, standing postural retraining BLE strength    ChelDurwin Reges   TaylStanford Scotlandudent-PT 07/30/2022, 11:39 AM

## 2022-08-03 ENCOUNTER — Telehealth: Payer: Self-pay | Admitting: *Deleted

## 2022-08-03 ENCOUNTER — Ambulatory Visit: Payer: 59 | Admitting: Physical Therapy

## 2022-08-03 NOTE — Patient Instructions (Signed)
Visit Information  Thank you for taking time to visit with me today. Please don't hesitate to contact me if I can be of assistance to you.   Following are the goals we discussed today:   Goals Addressed             This Visit's Progress    In home care needs       Care Coordination Interventions: Follow up phone call from patient regarding  paperwork for CAPS Patients states receiving letter from [place] stating that her application has been approved and she is now eligible to schedule the in home assessment  for CAP services Collaboration phone call to the [place], assessment expedited and will be done by phone-assessment scheduled for 08/04/22 at 10:30am with Kirk Ruths [phone] ext [phone] Patient confirmed that her daughter continues to assist patient with her care needs while determination is made Patient verbalized having no additional needs at this time , patient to contact this social worker with any additional needs          Our next appointment is by telephone on 08/11/22 at 10am  Please call the care guide team at [phone] if you need to cancel or reschedule your appointment.   If you are experiencing a Mental Health or [place] or need someone to talk to, please call [phone]   Patient verbalizes understanding of instructions and care plan provided today and agrees to view in [place]. Active MyChart status and patient understanding of how to access instructions and care plan via MyChart confirmed with patient.     Telephone follow up appointment with care management team member scheduled for: 08/11/22  Elliot Gurney, [place] Worker  [facility] Care Management [phone]

## 2022-08-03 NOTE — Patient Outreach (Signed)
  Care Coordination   Follow Up Visit Note   08/03/2022 Name: [name] MRN: [id number] DOB: [birth year]  [name] is a 77 y.o. year old female who sees Perrin Maltese, MD for primary care. I spoke with  [name] by phone today.  What matters to the patients health and wellness today?  In home care    Goals Addressed             This Visit's Progress    In home care needs       Care Coordination Interventions: Follow up phone call from patient regarding  paperwork for CAPS Patients states receiving letter from [place] stating that her application has been approved and she is now eligible to schedule the in home assessment  for CAP services Collaboration phone call to the [place], assessment expedited and will be done by phone-assessment scheduled for 08/04/22 at 10:30am with Kirk Ruths [phone] ext [phone] Patient confirmed that her daughter continues to assist patient with her care needs while determination is made Patient verbalized having no additional needs at this time , patient to contact this Education officer, museum with any additional needs          SDOH assessments and interventions completed:  No     Care Coordination Interventions:  Yes, provided   Interventions Today    Flowsheet Row Most Recent Value  Chronic Disease   Chronic disease during today's visit --  [chronic pain]  General Interventions   General Interventions Discussed/Reviewed General Interventions Reviewed, Manufacturing engineer services-in home assessment scheduled(appt will be virtual)]  Education Interventions   Provided Verbal Education On Applications  [CAP services]  Applications Other  [CAP services-in home assessment scheduled(appt will be virtual)]        Follow up plan: Follow up call scheduled for 08/11/22    Encounter Outcome:  Pt. Visit Completed

## 2022-08-04 ENCOUNTER — Telehealth: Payer: Self-pay | Admitting: *Deleted

## 2022-08-04 NOTE — Patient Outreach (Signed)
  Care Coordination   Follow Up Visit Note   08/04/2022 Name: [name] MRN: [id number] DOB: 11-21-1945  [name] is a 77 y.o. year old female who sees Perrin Maltese, MD for primary care. I spoke with  [name] by phone today.  What matters to the patients health and wellness today?  Personal care services    Goals Addressed             This Visit's Progress    In home care needs       Care Coordination Interventions: Follow up phone call from patient regarding  paperwork for CAPS Patients states that phone-assessment scheduled for 08/04/22 at 10:30am  was completed with Kirk Ruths [phone] ext [phone] Patient states that she should receive a final determination within the next two weeks Patient encouraged to contact her Medicaid worker to follow up on CAPS referral Patient confirmed that her daughter continues to assist patient with her care needs while determination is made Patient verbalized having no additional needs at this time , patient to contact this Education officer, museum with any additional needs          SDOH assessments and interventions completed:  No     Care Coordination Interventions:  Yes, provided   Interventions Today    Flowsheet Row Most Recent Value  General Interventions   General Interventions Discussed/Reviewed General Interventions Reviewed  Applications Other  [CAPS services]  Education Interventions   Applications Other  [CAPS services]        Follow up plan: Follow up call scheduled for 08/11/22    Encounter Outcome:  Pt. Visit Completed

## 2022-08-04 NOTE — Patient Instructions (Signed)
Visit Information  Thank you for taking time to visit with me today. Please don't hesitate to contact me if I can be of assistance to you.   Following are the goals we discussed today:   Goals Addressed             This Visit's Progress    In home care needs       Care Coordination Interventions: Follow up phone call from patient regarding  paperwork for CAPS Patients states that phone-assessment scheduled for 08/04/22 at 10:30am  was completed with Kirk Ruths [phone] ext [phone] Patient states that she should receive a final determination within the next two weeks Patient encouraged to contact her Medicaid worker to follow up on CAPS referral Patient confirmed that her daughter continues to assist patient with her care needs while determination is made Patient verbalized having no additional needs at this time , patient to contact this social worker with any additional needs          Our next appointment is by telephone on 08/11/22 at 10am  Please call the care guide team at [phone] if you need to cancel or reschedule your appointment.   If you are experiencing a Mental Health or [place] or need someone to talk to, please call [phone]   Patient verbalizes understanding of instructions and care plan provided today and agrees to view in [place]. Active MyChart status and patient understanding of how to access instructions and care plan via MyChart confirmed with patient.     Telephone follow up appointment with care management team member scheduled for: 08/11/22   Elliot Gurney, [place] Worker  [facility] Care Management [phone]

## 2022-08-05 ENCOUNTER — Ambulatory Visit: Payer: 59 | Admitting: Physical Therapy

## 2022-08-06 ENCOUNTER — Other Ambulatory Visit: Payer: Self-pay | Admitting: Family

## 2022-08-09 ENCOUNTER — Encounter: Payer: Self-pay | Admitting: Physical Therapy

## 2022-08-09 ENCOUNTER — Ambulatory Visit: Payer: 59 | Admitting: Physical Therapy

## 2022-08-09 DIAGNOSIS — R269 Unspecified abnormalities of gait and mobility: Secondary | ICD-10-CM | POA: Diagnosis not present

## 2022-08-09 DIAGNOSIS — R262 Difficulty in walking, not elsewhere classified: Secondary | ICD-10-CM | POA: Diagnosis not present

## 2022-08-09 DIAGNOSIS — G8929 Other chronic pain: Secondary | ICD-10-CM

## 2022-08-09 DIAGNOSIS — M545 Low back pain, unspecified: Secondary | ICD-10-CM | POA: Diagnosis not present

## 2022-08-09 DIAGNOSIS — R293 Abnormal posture: Secondary | ICD-10-CM | POA: Diagnosis not present

## 2022-08-09 NOTE — Progress Notes (Signed)
Pt already scheduled with RN for 08/13/2022

## 2022-08-09 NOTE — Therapy (Signed)
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 b.)07/18/2006 -endovas oblit complex ACOM aneur. c.) Endovas Tx of 6.8x4mm41mCA aneur. d.) 3.5x3mm 77mnant RMCA aneur 2/2 coil compaction. e.) Interval 3.2x3.1mm s69mular outpouching in ACOM c/w mild recannulization in neck. 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 g.) 3.7x2mm re8mnt of prev Tx'd ACOM aneur -failed embol 02/24/2012.   CKD (chronic kidney disease), stage IV (HCC)    a.) solitary functioning kidney on the RIGHT   Complication of anesthesia    a.) delayed emergence   Coronary artery disease 02/23/2007   a.) LHC 02/23/2007 --> EF 50%; 30% pLAD, 70% mRCA --> planned for staged PCI. b.) PCI 02/27/2007: EF 60%; 3.5 x 15 mm Vision BMS to 80% mRCA. b.) LHC 07/24/2007: EF 60%; minor irregs; no occlusive CAD; no intervention. c.) LHC 10/28/2010: EF 60%; 30% mLAD, LCx with minor luminal irregs, 20% ISR mRCA; no intervention. d.) LHC 05/31/2013: EF 60%; 40% mLAD, 30% ISR m-dRCA; no interventions.   DDD (degenerative disc disease), cervical    a.) s/p ACDF C5-C7; hardware in neck; patient appreciates stiffness and issues with mobility   Diastolic dysfunction    a.)  TTE 07/07/2021: EF 87.8%; normal PASP; trace TR/MR; G1DD.   Elevated hemidiaphragm    a.) LEFT   Headache(784.0)    Heart murmur     HLD (hyperlipidemia)    HOH (hard of hearing)    Has hearing aids, doesn't wear   Hyperparathyroidism due to renal insufficiency (HCC)    Hypertension    Incomplete right bundle branch block (RBBB)    Insomnia    a.) takes zolpidem   Long term (current) use of immunomodulator    a.) on DMARD therapy (hydroxychloriquine) for RA/SLE Dx.   Long term current use of antithrombotics/antiplatelets    a.) clopidogrel   Multiple acquired cysts of kidney    OSA (obstructive sleep apnea)    a.) does NOT use nocturnal pap therapy   Pancreatic cyst    Pneumonia    RBBB (right bundle branch block)    Rheumatoid arthritis (HCC)   [place]) on DMARD; hydroxychloriquine   Systemic lupus erythematosus (HCC)   [place] (type 2 diabetes mellitus) (HCC)   [place]ars dentures    partial lower   Past Surgical History:  Procedure Laterality Date   ANEURYSM COILING  08/2013   Attempted embolization of previously treated ACOM aneurysm; unsuccessful N/A 01/28/2012   [facility]     BICEPT TENODESIS Right 03/02/2022   Procedure: BICEPS TENODESIS;  Surgeon: Poggi, Corky MullLocation[facility];  Service: Orthopedics;  Laterality: Right;   BREAST CYST ASPIRATION Right 01/25/2012   FNA neg.   BREAST EXCISIONAL BIOPSY Left 06/26/2007   neg   BUNIONECTOMY Bilateral 2022   CARDIAC CATHETERIZATION Left 02/23/2007   Procedure: CARDIAC CATHETERIZATION; Location: [place]; Surgeon: Neoma Laming, MD   CARDIAC CATHETERIZATION Left 07/31/2007   Procedure: CARDIAC CATHETERIZATION; Location: [place]; Surgeon: Neoma Laming, MD   CARDIAC CATHETERIZATION Left 10/28/2010   Procedure: CARDIAC CATHETERIZATION; Location: [place]; Surgeon: Neoma Laming, MD   CARDIAC CATHETERIZATION Left 05/31/2013   Procedure: CARDIAC CATHETERIZATION; Location: [place]; Surgeon: Neoma Laming, MD   Carotid arteriogram; 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  N/A 07/18/2006   Location: [facility]   CATARACT EXTRACTION     CERVICAL FUSION N/A    Procedure: ACDF C5-C7   COLONOSCOPY WITH PROPOFOL N/A 10/04/2019   Procedure: COLONOSCOPY WITH BIOPSY;  Surgeon: Lin Landsman, MD;  Location: [place];  Service: Endoscopy;  Laterality: N/A;  Diabetic (borderline) - oral meds priority 3   CORONARY STENT INTERVENTION Left 02/27/2007   Procedure: CORONARY STENT INTERVENTION (3.5 x 15 mm Vision BMS to mRCA); Location: [place]; Surgeon: Kathlyn Sacramento, MD   Endovascular near complete obliteration of enlarging neck remnant of previously treated RIGHT MCA aneurysm using stent assisted coiling N/A 09/03/2010   [facility]   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 N/A 10/03/2006   Location: [facility]   HYSTEROSCOPY WITH D & C N/A 08/04/2021   Procedure: DILATATION AND CURETTAGE /HYSTEROSCOPY;  Surgeon: Homero Fellers, MD;  Location: [facility];  Service: Gynecology;  Laterality: N/A;   POLYPECTOMY N/A 10/04/2019   Procedure: POLYPECTOMY;  Surgeon: Lin Landsman, MD;  Location: [place];  Service: Endoscopy;  Laterality: N/A;   REVERSE SHOULDER ARTHROPLASTY Right 03/02/2022   Procedure: REVERSE SHOULDER ARTHROPLASTY;  Surgeon: Corky Mull, MD;  Location: [facility];  Service: Orthopedics;  Laterality: Right;   UPPER ESOPHAGEAL ENDOSCOPIC ULTRASOUND (EUS) N/A 04/15/2016   Procedure: UPPER ESOPHAGEAL ENDOSCOPIC ULTRASOUND (EUS);  Surgeon: Holly Bodily, MD;  Location: [facility];  Service: Gastroenterology;  Laterality: N/A;   Patient Active Problem List   Diagnosis Date Noted   Status post reverse total shoulder replacement, 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 She states that she has bilat pain in knees from RA and OA. Pt mentions her knees feel unstable sometimes and experiences mechanical sx. She has not had any imaging done on her knees. NPS: 4/10 for LBP currently. NPS: 6/10 for knees currently.   PERTINENT HISTORY:  Pt is a 77 year old female presenting with with chronic LBP with most recent flare up "about a year ago with insideous onset". PMH of posterior decompression and posterior spinal fusion at L4-L5. Pt reports bilat LE pain and n/t down BLE but the pain "takes turns". Current and best pain 5/10; worst 10/10. Has most pain in the morning when she wakes up, walking >79mns, standing >566ms (to cook), lifting, carrying, stair ascent. Pt is retired, enjoys watching TV, "because there is not very much I can do". Before her back pain flared up she enjoyed cooking, gardened, and enjoyed walking. Pt reports having SPC that she uses when she has increased pain, and adaptive bathroom in her apartment (that she lives in alone). Has had 1 fall in the past 6 months in the shower while trying to shave her legs. Does endorse feeling off balance, that her "feet do things on their own", denies neuropathy or diabetes. Pt denies N/V, B&B changes, unexplained weight fluctuation, saddle paresthesia, fever, night sweats, or unrelenting night pain at this time.   PAIN:  Are you having pain? Yes: NPRS scale: 5/10 Pain location: across low back; "random  radiating pain into Les" Pain description: dull ache Aggravating factors: morning when she wakes up, walking >40m67m, lifting, carrying Relieving factors: gabapentin, rest  PRECAUTIONS: None  WEIGHT BEARING RESTRICTIONS: No  FALLS:  Has patient fallen in last 6 months? Yes. Number of falls 1  OCCUPATION: retired  PLOF: Independent with basic ADLs  PATIENT GOALS: decrease pain to be able to walk again   OBJECTIVE:   DIAGNOSTIC FINDINGS:  MRI 10/02/21  Interval posterior decompression and posterior spinal fusion at L4-L5. Multifactorial mild-to-moderate spinal canal narrowing at the level of the L4 inferior endplate has improved. Endplate osteophytes contribute to moderate right neural foraminal narrowing at this level.  Additional lumbar and lower thoracic spondylosis, as outlined and having progressed at multiple levels since the prior MRI of 02/26/2006. Findings are most notably as follows.  At L3-L4, there is progressive multifactorial severe central canal and left subarticular stenosis, and progressive moderate right subarticular stenosis. Progressive moderate bilateral neural foraminal narrowing at this level. Also of note, facet arthrosis is advanced at this level and bilateral facet joint effusions are present.  No more than mild relative narrowing of the central canal at the remaining levels. Additional sites of subarticular stenosis, as detailed and greatest on the left at L5-S1 (moderate at this site with crowding of the descending left S1 nerve root).  Progressive advanced disc degeneration at L1-L2.     TODAY'S TREATMENT:  DATE: 08/09/22 -Nustep, Seat 8 AA/ROM, legs only level 4 x 5 minutes for gentle lumbar mobility and bilat LE strengthening - Hooklying alt trunk rotations x 20  - Bridge with eccentric control x 12 - SLR with 4# x 7  (bilateral) - Bird dog x 3 rounds (isolating each arm and leg- bilaterally) - STS 2x x 10 - Green t-ball rollout (4 x 3 deep breaths)  07/23/22  -Nustep, Seat 8 AA/ROM, legs only level 2 x 5 minutes for gentle lumbar mobility and bilat LE strengthening - Hooklying alt trunk rotations x 20  - Bridge x 12 - SLR with 3# x 12 - Bird dog x 3 rounds (isolating each arm and leg- bilaterally) - STS 2x x 10 - Green t-ball rollout (4 x 3 deep breaths)  Last session: - Side steps walk outs with YTB for 40 ft  - Hip 3-way with YTB x 6 (bilaterally)  Last session: 07/21/22 Hooklying alt trunk rotations x20 with cuing to maintain spine contact to mat table  SKTC 30sec bilat in hooklying; with CLLE ext x30 sec bilat Hooklying knee fall outs x20 Prone prop on elbows 30sec; 2 x12 Following No LBP with L glute pain reported 5/10   Squat to very elevated mat table (for TC of post hip movement) with BUE flex 90d 2x 10 consistent cuing for technique with some carry over Seated alt marching x20 BUE flex to 90d for target with consistent cuing for core activation  Squat to very elevated mat table BUE 90d flex x10 Seated alt marching x20 BUE flex to 90d  Seated theraball forward rolls x12 with cuing for breath control with good carry over Seated childs pose 30sec     PATIENT EDUCATION:  Education details: Patient was educated on diagnosis, anatomy and pathology involved, prognosis, role of PT, and was given an HEP, demonstrating exercise with proper form following verbal and tactile cues, and was given a paper hand out to continue exercise at home. Pt was educated on and agreed to plan of care.  Person educated: Patient Education method: Explanation, Demonstration, and Handouts Education comprehension: verbalized understanding, returned demonstration, and verbal cues required  HOME EXERCISE PROGRAM: Access Code: [email or web address]  ASSESSMENT:  CLINICAL IMPRESSION: PT continued therex progression for  functional hip and core strengthening with success. Pt exhibited no increase in lumbar or hip pain for the entirety of the session. NPS: 0/10 for lumbar pain and 2/10 for knees at the end of the session. Pt unable to complete SL bridge bilaterally due to pain; PT regressed therex accordingly. Pt is able to comply with demonstrations, VC, and TC for proper form and technique of therex. Pt with cuing for glute and core activation throughout exercises with great carry over. Pt had muscle fatigue but continued to put forth good effort as the session went on. Pt will continue to benefit from skilled PT to decrease pain and get back to PLOF and improve QoL.  OBJECTIVE IMPAIRMENTS: Abnormal gait, decreased activity tolerance, decreased balance, decreased coordination, decreased endurance, decreased mobility, difficulty walking, decreased ROM, decreased strength, increased fascial restrictions, impaired flexibility, impaired tone, improper body mechanics, postural dysfunction, obesity, and pain.   ACTIVITY LIMITATIONS: carrying, lifting, bending, sitting, standing, squatting, stairs, transfers, bathing, hygiene/grooming, and locomotion level  PARTICIPATION LIMITATIONS: meal prep, cleaning, laundry, driving, community activity, and yard work  PERSONAL FACTORS: Age, Fitness, Past/current experiences, Time since onset of injury/illness/exacerbation, and 3+ comorbidities: HTN, HLD, DDD, OA, CKD, CAD, DM2, RA  are also affecting patient's  functional outcome.   REHAB POTENTIAL: Fair Several comorbidities and chronicity of pain affecting potential  CLINICAL DECISION MAKING: Evolving/moderate complexity  EVALUATION COMPLEXITY: Moderate   GOALS: Goals reviewed with patient? Yes  SHORT TERM GOALS: Target date: 07/01/22  Pt will be independent with HEP in order to improve strength and balance in order to decrease fall risk and improve function at home and work.  Baseline:07/01/22 HEP given  Goal status:  INITIAL    LONG TERM GOALS: Target date: 08/27/22  Patient will increase FOTO score to 46 to demonstrate predicted increase in functional mobility to complete ADLs  Baseline: 29 Goal status: INITIAL  2.  Pt will decrease 5TSTS of 15seconds or less in order to demonstrate  age-matched norm in LE strength to demonstrate decreased risk of recurrent falls  Baseline: 38sec Goal status: INITIAL  3.  Pt will increase 10MWT to at least 0.65ms in order to demonstrate safe walk speed needed for household ambulation  Baseline: self selected: 0.568m fastest: 0.5693mGoal status: INITIAL  4.  Pt will decrease worst pain as reported on NPRS by at least 3 points in order to demonstrate clinically significant reduction in pain.  Baseline: 10/10 Goal status: INITIAL    PLAN:  PT FREQUENCY: 1-2x/week  PT DURATION: 8 weeks  PLANNED INTERVENTIONS: Therapeutic exercises, Therapeutic activity, Neuromuscular re-education, Balance training, Gait training, Patient/Family education, Self Care, Joint mobilization, Joint manipulation, Stair training, DME instructions, Aquatic Therapy, Dry Needling, Electrical stimulation, Spinal manipulation, Spinal mobilization, Cryotherapy, Moist heat, Traction, Ultrasound, Ionotophoresis 4mg32m Dexamethasone, Manual therapy, and Re-evaluation.  PLAN FOR NEXT SESSION: Reassessment. Interventions to reduce rigid lumbar lordosis improve flexion ROM, core flexion strength, standing postural retraining BLE strength    ChelDurwin Reges   ChelDurwin Reges 08/09/2022, 12:34 PM

## 2022-08-11 ENCOUNTER — Telehealth: Payer: Self-pay | Admitting: *Deleted

## 2022-08-11 ENCOUNTER — Encounter: Payer: Self-pay | Admitting: *Deleted

## 2022-08-11 NOTE — Patient Instructions (Signed)
Visit Information  Thank you for taking time to visit with me today. Please don't hesitate to contact me if I can be of assistance to you.   Following are the goals we discussed today:   Goals Addressed             This Visit's Progress    In home care needs       Care Coordination Interventions: Follow up phone call from patient regarding  CAPS dterminatiom Phone-assessment completed on  08/04/22 at 10:30am  by Kirk Ruths [phone] ext [phone] determination pending Patient encouraged to contact her Medicaid worker to follow up on CAPS referral Patient confirmed that her daughter continues to assist patient with her care needs while determination is made [place] to also be contacted to follow up on CAPS dtermination Patient verbalized having no additional needs at this time , patient to contact this social worker with any additional needs           Please call the care guide team at [phone] if you need to cancel or reschedule your appointment.   If you are experiencing a Mental Health or [place] or need someone to talk to, please call 911   Patient verbalizes understanding of instructions and care plan provided today and agrees to view in [place]. Active MyChart status and patient understanding of how to access instructions and care plan via MyChart confirmed with patient.     No further follow up required: patient to follow up with CAPS determination  [place], [place] Worker  [facility] Care Management [phone]

## 2022-08-11 NOTE — Patient Outreach (Signed)
  Care Coordination   Follow Up Visit Note   08/11/2022 Name: [name] MRN: [id number] DOB: Nov 10, 1945  [name] is a 77 y.o. year old female who sees Perrin Maltese, MD for primary care. I spoke with  [name] by phone today.  What matters to the patients health and wellness today?  Personal Care Services    Goals Addressed             This Visit's Progress    In home care needs       Care Coordination Interventions: Follow up phone call from patient regarding  CAPS dterminatiom Phone-assessment completed on  08/04/22 at 10:30am  by Kirk Ruths [phone] ext [phone] determination pending Patient encouraged to contact her Medicaid worker to follow up on CAPS referral Patient confirmed that her daughter continues to assist patient with her care needs while determination is made [place] to also be contacted to follow up on CAPS dtermination Patient verbalized having no additional needs at this time , patient to contact this Education officer, museum with any additional needs          SDOH assessments and interventions completed:  No     Care Coordination Interventions:  Yes, provided  Interventions Today    Flowsheet Row Most Recent Value  Chronic Disease   Chronic disease during today's visit Other  [chronic pain]  General Interventions   General Interventions Discussed/Reviewed General Interventions Reviewed  Applications Personal Care Services  [CAPS determination pending]  Education Interventions   Applications Personal Care Services  [CAPS determination pending]       Follow up plan: No further intervention required.   Encounter Outcome:  Pt. Visit Completed

## 2022-08-11 NOTE — Patient Outreach (Signed)
  Care Coordination   08/11/2022 Name: [name] MRN: [id number] DOB: 10-29-1945   Care Coordination Outreach Attempts:  An unsuccessful telephone outreach was attempted today to offer the patient information about available care coordination services as a benefit of their health plan.   Follow Up Plan:  Additional outreach attempts will be made to offer the patient care coordination information and services.   Encounter Outcome:  No Answer   Care Coordination Interventions:  No, not indicated    Bryttney Netzer, [place] Worker  [facility] Care Management [phone]

## 2022-08-12 ENCOUNTER — Encounter: Payer: Self-pay | Admitting: Neurosurgery

## 2022-08-12 ENCOUNTER — Ambulatory Visit (INDEPENDENT_AMBULATORY_CARE_PROVIDER_SITE_OTHER): Payer: 59 | Admitting: Neurosurgery

## 2022-08-12 VITALS — BP 134/84 | Ht 63.0 in | Wt 172.0 lb

## 2022-08-12 DIAGNOSIS — M5136 Other intervertebral disc degeneration, lumbar region: Secondary | ICD-10-CM

## 2022-08-12 DIAGNOSIS — M4316 Spondylolisthesis, lumbar region: Secondary | ICD-10-CM

## 2022-08-12 DIAGNOSIS — E119 Type 2 diabetes mellitus without complications: Secondary | ICD-10-CM | POA: Diagnosis not present

## 2022-08-12 DIAGNOSIS — Z794 Long term (current) use of insulin: Secondary | ICD-10-CM | POA: Diagnosis not present

## 2022-08-12 NOTE — Progress Notes (Signed)
Referring Physician:  Perrin Maltese, MD [address] [place],  [place] [address]  Primary Physician:  Perrin Maltese, MD  History of Present Illness: 08/12/2022 She continues to have symptoms but some days are better than others.  She is trying physical therapy which is helping her a small amount.  06/30/2022 Ms. Heatherly returns to see me.  She continues to have symptoms as noted below.  She will start physical therapy tomorrow.  04/29/2022 Ms. Tarita Kluger is here today with a chief complaint of low back pain that radiates in the bilateral hips and legs, right greater than left. Occasionally she will have numbness and tingling in the legs.  She mostly has pain in the right leg.  It has been going on for several months.  She reports pain as bad as 10 out of 10 with standing, walking, bending, lifting, and squatting.   Bowel/Bladder Dysfunction: none  Conservative measures:  Physical therapy: has not participated in Multimodal medical therapy including regular antiinflammatories: gabapentin, prednisone, tizanidine, tylenol Injections: has not received epidural steroid injections  Past Surgery:  Lumbar Surgery around 2008-2009 Cervical Surgery Brain Surgery for Aneurysm in 2013  Aleiyah N Lipari has no symptoms of cervical myelopathy.  The symptoms are causing a significant impact on the patient's life.   Review of Systems:  A 10 point review of systems is negative, except for the pertinent positives and negatives detailed in the HPI.  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 b.)07/18/2006 -endovas oblit complex ACOM aneur. c.) Endovas Tx of 6.8x4mm78mCA aneur. d.) 3.5x3mm 83mnant RMCA aneur 2/2 coil compaction. e.) Interval 3.2x3.1mm s866mular outpouching in ACOM c/w mild recannulization in neck. f.) Endovas  near complete oblit of enlarging RMCA. g.) 3.7x2mm re24mnt of prev Tx'd ACOM aneur -failed embol 02/24/2012.   CKD (chronic kidney disease), stage IV (HCC)    a.) solitary functioning kidney on the RIGHT   Complication of anesthesia    a.) delayed emergence   Coronary artery disease 02/23/2007   a.) LHC 02/23/2007 --> EF 50%; 30% pLAD, 70% mRCA --> planned for staged PCI. b.) PCI 02/27/2007: EF 60%; 3.5 x 15 mm Vision BMS to 80% mRCA. b.) LHC 07/24/2007: EF 60%; minor irregs; no occlusive CAD; no intervention. c.) LHC 10/28/2010: EF 60%; 30% mLAD, LCx with minor luminal irregs, 20% ISR mRCA; no intervention. d.) LHC 05/31/2013: EF 60%; 40% mLAD, 30% ISR m-dRCA; no interventions.   DDD (degenerative disc disease), cervical    a.) s/p ACDF C5-C7; hardware in neck; patient appreciates stiffness and issues with mobility   Diastolic dysfunction    a.)  TTE 07/07/2021: EF 87.8%; normal PASP; trace TR/MR; G1DD.   Elevated hemidiaphragm    a.) LEFT   Headache(784.0)    Heart murmur    HLD (hyperlipidemia)    HOH (hard of hearing)    Has hearing aids, doesn't wear   Hyperparathyroidism due to renal insufficiency (HCC)    Hypertension    Incomplete right bundle branch block (RBBB)    Insomnia    a.) takes zolpidem   Long term (current) use of immunomodulator    a.) on DMARD therapy (hydroxychloriquine) for RA/SLE Dx.   Long term current use of antithrombotics/antiplatelets    a.) clopidogrel   Multiple acquired cysts of kidney    OSA (  obstructive sleep apnea)    a.) does NOT use nocturnal pap therapy   Pancreatic cyst    Pneumonia    RBBB (right bundle branch block)    Rheumatoid arthritis ([place])    a.) on DMARD; hydroxychloriquine   Systemic lupus erythematosus ([place])    T2DM (type 2 diabetes mellitus) ([place])    Wears dentures    partial lower    Past Surgical History: Past Surgical History:  Procedure Laterality Date   ANEURYSM COILING  08/2013   Attempted embolization of previously  treated ACOM aneurysm; unsuccessful N/A 01/28/2012   Location: [place] CYST MARSUPIALIZATION     BICEPT TENODESIS Right 03/02/2022   Procedure: BICEPS TENODESIS;  Surgeon: Corky Mull, MD;  Location: [facility];  Service: Orthopedics;  Laterality: Right;   BREAST CYST ASPIRATION Right 01/25/2012   FNA neg.   BREAST EXCISIONAL BIOPSY Left 06/26/2007   neg   BUNIONECTOMY Bilateral 2022   CARDIAC CATHETERIZATION Left 02/23/2007   Procedure: CARDIAC CATHETERIZATION; Location: [place]; Surgeon: Neoma Laming, MD   CARDIAC CATHETERIZATION Left 07/31/2007   Procedure: CARDIAC CATHETERIZATION; Location: [place]; Surgeon: Neoma Laming, MD   CARDIAC CATHETERIZATION Left 10/28/2010   Procedure: CARDIAC CATHETERIZATION; Location: [place]; Surgeon: Neoma Laming, MD   CARDIAC CATHETERIZATION Left 05/31/2013   Procedure: CARDIAC CATHETERIZATION; Location: [place]; Surgeon: Neoma Laming, MD   Carotid arteriogram; 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 N/A 07/18/2006   Location[facility]   CATARACT EXTRACTION     CERVICAL FUSION N/A    Procedure: ACDF C5-C7   COLONOSCOPY WITH PROPOFOL N/A 10/04/2019   Procedure: COLONOSCOPY WITH BIOPSY;  Surgeon: Lin Landsman, MD;  Location: [facility];  Service: Endoscopy;  Laterality: N/A;  Diabetic (borderline) - oral meds priority 3   CORONARY STENT INTERVENTION Left 02/27/2007   Procedure: CORONARY STENT INTERVENTION (3.5 x 15 mm Vision BMS to mRCA); Location: [place]; Surgeon: Kathlyn Sacramento, MD   Endovascular near complete obliteration of enlarging neck remnant of previously treated RIGHT MCA aneurysm using stent assisted coiling N/A 09/03/2010   Location: [facility]   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 N/A 10/03/2006   Location: [facility]   HYSTEROSCOPY WITH D & C N/A 08/04/2021   Procedure: DILATATION AND CURETTAGE  /HYSTEROSCOPY;  Surgeon: Homero Fellers, MD;  Location: [facility];  Service: Gynecology;  Laterality: N/A;   POLYPECTOMY N/A 10/04/2019   Procedure: POLYPECTOMY;  Surgeon: Lin Landsman, MD;  Location: [place];  Service: Endoscopy;  Laterality: N/A;   REVERSE SHOULDER ARTHROPLASTY Right 03/02/2022   Procedure: REVERSE SHOULDER ARTHROPLASTY;  Surgeon: Corky Mull, MD;  Location: [facility];  Service: Orthopedics;  Laterality: Right;   UPPER ESOPHAGEAL ENDOSCOPIC ULTRASOUND (EUS) N/A 04/15/2016   Procedure: UPPER ESOPHAGEAL ENDOSCOPIC ULTRASOUND (EUS);  Surgeon: Holly Bodily, MD;  Location: [facility];  Service: Gastroenterology;  Laterality: N/A;    Allergies: Allergies as of 08/12/2022 - Review Complete 07/20/2022  Allergen Reaction Noted   Isosorbide dinitrate Other (See Comments) 07/15/2017   Amlodipine Swelling 07/23/2011   Sulfa antibiotics Itching 01/13/2012   Oxycodone Itching 04/14/2021   Sulfasalazine Itching 01/13/2012    Medications: No outpatient medications have been marked as taking for the 08/12/22 encounter (Appointment) with Meade Maw, MD.    Social History: Social History   Tobacco Use   Smoking status: Never   Smokeless tobacco: Never  Vaping Use  Vaping Use: Never used  Substance Use Topics   Alcohol use: Yes    Comment: Occasionally at christmas   Drug use: No    Family Medical History: Family History  Problem Relation Age of Onset   Breast cancer Paternal Aunt 93   Breast cancer Paternal 52    Heart disease Mother    Heart disease Father    Stroke Father    Diabetes Sister    Diabetes Brother     Physical Examination: There were no vitals filed for this visit.   General: Patient is well developed, well nourished, calm, collected, and in no apparent distress. Attention to examination is appropriate.  Neck:   Supple.  Full range of motion.  Respiratory: Patient is breathing without any  difficulty.   NEUROLOGICAL:     Awake, alert, oriented to person, place, and time.  Speech is clear and fluent. Fund of knowledge is appropriate.   Cranial Nerves: Pupils equal round and reactive to light.  Facial tone is symmetric.  Facial sensation is symmetric. Shoulder shrug is symmetric. Tongue protrusion is midline.  There is no pronator drift.  ROM of spine: full.    Strength: Side Biceps Triceps Deltoid Interossei Grip Wrist Ext. Wrist Flex.  R 5 5 5 5 5 5 5  $ L 5 5 5 5 5 5 5   $ Side Iliopsoas Quads Hamstring PF DF EHL  R 5 5 5 5 5 5  $ L 5 5 5 5 5 5   $ Reflexes are 1+ and symmetric at the biceps, triceps, brachioradialis, patella and achilles.   Hoffman's is absent.   Bilateral upper and lower extremity sensation is intact to light touch.    No evidence of dysmetria noted.  Gait is abnormal and requires a cane.     Medical Decision Making  Imaging: MRI L spine 10/02/2021 T12-L1: Disc bulge, new from the prior MRI. Progressive mild facet arthrosis and ligamentum flavum hypertrophy. Small volume fluid within the bilateral facet joints. No significant spinal canal or foraminal stenosis.   L1-L2: Progressive disc bulge. Progressive disc bulge. New superimposed small central disc extrusion with slight cranial and caudal migration. A small right subarticular/foraminal disc protrusion is also new. Progressive facet arthrosis (mild right, moderate left) and ligamentum flavum hypertrophy. There is now mild bilateral subarticular narrowing at this level (without appreciable nerve root impingement). Mild-to-moderate right neural foraminal narrowing is new from the prior MRI.   L2-L3: Small disc bulge asymmetric to the left. Facet arthrosis (moderate right, moderate/advanced left) with ligamentum flavum hypertrophy, progressed. There is now mild left greater than right subarticular narrowing at this level (without appreciable nerve root impingement). Mild narrowing of the  central canal is new from the prior MRI. Mild relative left neural foraminal narrowing, also new.   L3-L4: Small central zone posterior annular fissure, new from the prior MRI. Disc bulge, also new. Advanced facet arthrosis with ligamentum flavum hypertrophy, progressed. Bilateral facet joint effusions, new from the prior MRI. Severe central canal and left subarticular stenosis, new from the prior MRI. Moderate right subarticular stenosis is also new. There is now bilateral neural foraminal narrowing at this level (moderate right, mild left).   L4-L5: Interval posterior decompression and posterior spinal fusion. 3 mm fused grade 1 anterolisthesis. Endplate osteophytes. Facet arthrosis and ligamentum flavum hypertrophy. Mild-to-moderate spinal canal narrowing at the level of the L4 inferior endplate, improved from the prior MRI. Endplate osteophytes contribute to moderate right neural foraminal narrowing. No appreciable left foraminal stenosis.  L5-S1: 3 mm grade 1 anterolisthesis. Disc uncovering. Central to left foraminal zone posterior annular fissure. Facet arthrosis (moderate right, moderate/advanced left). Slight ligamentum flavum hypertrophy on the left. Moderate left subarticular stenosis with crowding of the descending left S1 nerve root (series 10, image 31). Mild right subarticular narrowing (without appreciable nerve root impingement). Central canal patent. Mild relative left neural foraminal narrowing.   IMPRESSION: Since the prior lumbar spine MRI 02/26/2006, there has been interval posterior decompression and posterior spinal fusion at L4-L5. Multifactorial mild-to-moderate spinal canal narrowing at the level of the L4 inferior endplate has improved. Endplate osteophytes contribute to moderate right neural foraminal narrowing at this level.   Additional lumbar and lower thoracic spondylosis, as outlined and having progressed at multiple levels since the prior MRI  of 02/26/2006. Findings are most notably as follows.   At L3-L4, there is progressive multifactorial severe central canal and left subarticular stenosis, and progressive moderate right subarticular stenosis. Progressive moderate bilateral neural foraminal narrowing at this level. Also of note, facet arthrosis is advanced at this level and bilateral facet joint effusions are present.   No more than mild relative narrowing of the central canal at the remaining levels. Additional sites of subarticular stenosis, as detailed and greatest on the left at L5-S1 (moderate at this site with crowding of the descending left S1 nerve root).   Progressive advanced disc degeneration at L1-L2.     Electronically Signed   By: Kellie Simmering D.O.   On: 10/02/2021 09:20  I have personally reviewed the images and agree with the above interpretation.  Assessment and Plan: Ms. Fregoso is a pleasant 77 y.o. 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  I think she is likely to need surgical intervention, but will continue physical therapy for now.  She would like to get an injection.  She is visiting her daughter during May.  I will see her back in June with a new MRI scan.    After her MRI scan, I would likely recommend L3-4 lateral lumbar interbody fusion with extension of her posterior instrumentation.  I spent a total of 10 minutes in face-to-face and non-face-to-face activities related to this patient's care today.  Thank you for involving me in the care of this patient.      Alyra Patty K. Izora Ribas MD, [facility] Neurosurgery

## 2022-08-13 ENCOUNTER — Ambulatory Visit: Payer: Self-pay

## 2022-08-13 NOTE — Patient Outreach (Signed)
  Care Coordination   08/13/2022 Name: [name] MRN: [id number] DOB: 1946-02-05   Care Coordination Outreach Attempts:  Successful contact made with patient.  Patient states she is unable to take call due to [facility] calling later than scheduled appointment.   RNCM apologized for late call due to complex situation with previous patient.  Patient verbally agreed to reschedule.   Follow Up Plan:  Additional outreach attempts will be made to offer the patient care coordination information and services.   Encounter Outcome:  Pt. Request to Call Back   Care Coordination Interventions:  No, not indicated    Quinn Plowman [facility] [phone] direct line

## 2022-08-19 ENCOUNTER — Ambulatory Visit: Payer: 59

## 2022-08-23 ENCOUNTER — Ambulatory Visit: Payer: 59 | Attending: Neurosurgery | Admitting: Physical Therapy

## 2022-08-23 ENCOUNTER — Telehealth: Payer: Self-pay

## 2022-08-23 ENCOUNTER — Ambulatory Visit: Payer: Self-pay

## 2022-08-23 NOTE — Patient Outreach (Signed)
  Care Coordination   Follow Up Visit Note   08/23/2022 Name: [name] MRN: [id number] DOB: June 27, 1945  [name] is a 77 y.o. year old female who sees Perrin Maltese, MD for primary care. I spoke with  [name] by phone today.  What matters to the patients health and wellness today?  Patient states she missed her PT appointment this morning because it was so early.  Patient states she is scheduled to have a back injection on 08/30/22.  She states back surgery is being considered as an option for her.  Patient states she is considering stopping PT until she sees how the back injection is going to work.  Patient states she will be out of town for a month to visit family  and request next follow up call with RNCM be in June 2024.  Patient states she will follow up with with the surgeon in June and have a repeat MRI.    Goals Addressed             This Visit's Progress    Patient Stated:  " I want to manage my arthritis pain"       Interventions Today    Flowsheet Row Most Recent Value  Chronic Disease   Chronic disease during today's visit Other  [L3-4 with substantial facet arthrosis and severe stenosis]  General Interventions   General Interventions Discussed/Reviewed General Interventions Reviewed, Doctor Visits  [Confirmed date with patient for scheduled back injection.  Patient advised to contact her outpatient PT provider to arrange later appt. times.  Patient also encouraged to discuss her concerns about having back injection and continuing PT.]  Doctor Visits Discussed/Reviewed Doctor Visits Reviewed  [Reviewed scheduled/ upcoming provider appointments.]  Exercise Interventions   Exercise Discussed/Reviewed Exercise Reviewed  [Advised to continue exercises provided by PT at home.]              SDOH assessments and interventions completed:  No     Care Coordination Interventions:  Yes, provided   Follow up plan: Follow up call scheduled  for 11/26/22    Encounter Outcome:  Pt. Visit Completed   Quinn Plowman RN,BSN,CCM [place] [phone] direct line

## 2022-08-23 NOTE — Patient Outreach (Signed)
  Care Coordination   08/23/2022 Name: [name] MRN: [id number] DOB: 29-Jul-1945   Care Coordination Outreach Attempts:  An unsuccessful telephone outreach was attempted for a scheduled appointment today. HIPAA compliant voice message left with call back phone number and return call request.   Follow Up Plan:  Additional outreach attempts will be made to offer the patient care coordination information and services.   Encounter Outcome:  No Answer   Care Coordination Interventions:  No, not indicated    Quinn Plowman [facility] [place] [phone] direct line

## 2022-08-25 ENCOUNTER — Ambulatory Visit: Payer: 59 | Admitting: Physical Therapy

## 2022-08-26 ENCOUNTER — Encounter: Payer: Self-pay | Admitting: Internal Medicine

## 2022-08-26 ENCOUNTER — Ambulatory Visit (INDEPENDENT_AMBULATORY_CARE_PROVIDER_SITE_OTHER): Payer: 59 | Admitting: Internal Medicine

## 2022-08-26 VITALS — BP 128/74 | HR 84 | Ht 61.0 in | Wt 170.0 lb

## 2022-08-26 DIAGNOSIS — M0579 Rheumatoid arthritis with rheumatoid factor of multiple sites without organ or systems involvement: Secondary | ICD-10-CM

## 2022-08-26 DIAGNOSIS — I10 Essential (primary) hypertension: Secondary | ICD-10-CM | POA: Diagnosis not present

## 2022-08-26 DIAGNOSIS — I25118 Atherosclerotic heart disease of native coronary artery with other forms of angina pectoris: Secondary | ICD-10-CM

## 2022-08-26 DIAGNOSIS — M3214 Glomerular disease in systemic lupus erythematosus: Secondary | ICD-10-CM | POA: Diagnosis not present

## 2022-08-26 DIAGNOSIS — M1A09X1 Idiopathic chronic gout, multiple sites, with tophus (tophi): Secondary | ICD-10-CM

## 2022-08-26 DIAGNOSIS — J301 Allergic rhinitis due to pollen: Secondary | ICD-10-CM

## 2022-08-26 DIAGNOSIS — E782 Mixed hyperlipidemia: Secondary | ICD-10-CM

## 2022-08-26 DIAGNOSIS — E119 Type 2 diabetes mellitus without complications: Secondary | ICD-10-CM

## 2022-08-26 DIAGNOSIS — M778 Other enthesopathies, not elsewhere classified: Secondary | ICD-10-CM

## 2022-08-26 LAB — POCT CBG (FASTING - GLUCOSE)-MANUAL ENTRY: Glucose Fasting, POC: 231 mg/dL — AB (ref 70–99)

## 2022-08-26 MED ORDER — FLUTICASONE PROPIONATE 50 MCG/ACT NA SUSP: 2.0000 | Freq: Every day | NASAL | 2 refills | Status: DC

## 2022-08-26 MED ORDER — LORATADINE 10 MG PO TABS: 10.0000 mg | ORAL_TABLET | Freq: Every day | ORAL | 6 refills | Status: DC

## 2022-08-26 NOTE — Progress Notes (Signed)
Established Patient Office Visit  Subjective:  Patient ID: [name], female    DOB: [birth year]  Age: 77 y.o. MRN: [id number]  Chief Complaint  Patient presents with   Follow-up    Left hand/thumb pain    Patient comes in ahead of her scheduled visit because she has 2 complaints.  First of all she complains of pain along the left side of her wrist and thumb.  She sometimes feels stiffness in her thumb as well.  Patient has history of SLE and rheumatoid arthritis.  She is also under the care of of orthopedics for her shoulder pain.  She does not have any tingling or numbness in her arm but the pain comes intermittently especially when she uses her left hand a lot.   The next complaint is that she has crackling and popping noises in her left ear.  There is no pain ,and no ringing in the ear.  However she does feel a little bit of dizziness.  No fevers, no chills, no sore throat and no headaches.  On exam there is a fluid behind her tympanic membrane, there is no redness or inflammation.  Patient does have history of allergic rhinitis.  Will start of Flonase nasal spray as well as Claritin daily. She will discuss her left wrist tendinitis with her orthopedic surgeon at next follow-up.  Meanwhile she will rub Voltaren gel and try to use a wrist brace.     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 b.)07/18/2006 -endovas oblit complex ACOM aneur. c.) Endovas Tx of 6.8x4mm20mCA aneur. d.) 3.5x3mm 33mnant RMCA aneur 2/2 coil compaction. e.) Interval 3.2x3.1mm s104mular outpouching in ACOM c/w mild recannulization in neck. 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 g.) 3.7x2mm re46mnt of prev Tx'd ACOM aneur -failed embol 02/24/2012.   CKD (chronic kidney disease), stage IV (HCC)    a.) solitary functioning kidney on the RIGHT   Complication of  anesthesia    a.) delayed emergence   Coronary artery disease 02/23/2007   a.) LHC 02/23/2007 --> EF 50%; 30% pLAD, 70% mRCA --> planned for staged PCI. b.) PCI 02/27/2007: EF 60%; 3.5 x 15 mm Vision BMS to 80% mRCA. b.) LHC 07/24/2007: EF 60%; minor irregs; no occlusive CAD; no intervention. c.) LHC 10/28/2010: EF 60%; 30% mLAD, LCx with minor luminal irregs, 20% ISR mRCA; no intervention. d.) LHC 05/31/2013: EF 60%; 40% mLAD, 30% ISR m-dRCA; no interventions.   DDD (degenerative disc disease), cervical    a.) s/p ACDF C5-C7; hardware in neck; patient appreciates stiffness and issues with mobility   Diastolic dysfunction    a.)  TTE 07/07/2021: EF 87.8%; normal PASP; trace TR/MR; G1DD.   Elevated hemidiaphragm    a.) LEFT   Headache(784.0)    Heart murmur    HLD (hyperlipidemia)    HOH (hard of hearing)    Has hearing aids, doesn't wear   Hyperparathyroidism due to renal insufficiency (HCC)    Hypertension    Incomplete right bundle branch block (RBBB)    Insomnia    a.) takes zolpidem   Long term (current) use of immunomodulator    a.) on DMARD therapy (hydroxychloriquine) for RA/SLE Dx.   Long term current use of antithrombotics/antiplatelets    a.) clopidogrel   Multiple acquired cysts of kidney    OSA (obstructive  sleep apnea)    a.) does NOT use nocturnal pap therapy   Pancreatic cyst    Pneumonia    RBBB (right bundle branch block)    Rheumatoid arthritis ([place])    a.) on DMARD; hydroxychloriquine   Systemic lupus erythematosus ([place])    T2DM (type 2 diabetes mellitus) ([place])    Wears dentures    partial lower    Past Surgical History:  Procedure Laterality Date   ANEURYSM COILING  08/2013   Attempted embolization of previously treated ACOM aneurysm; unsuccessful N/A 01/28/2012   Location: [place] CYST MARSUPIALIZATION     BICEPT TENODESIS Right 03/02/2022   Procedure: BICEPS TENODESIS;  Surgeon: Corky Mull, MD;  Location:  [facility];  Service: Orthopedics;  Laterality: Right;   BREAST CYST ASPIRATION Right 01/25/2012   FNA neg.   BREAST EXCISIONAL BIOPSY Left 06/26/2007   neg   BUNIONECTOMY Bilateral 2022   CARDIAC CATHETERIZATION Left 02/23/2007   Procedure: CARDIAC CATHETERIZATION; Location: [place]; Surgeon: Neoma Laming, MD   CARDIAC CATHETERIZATION Left 07/31/2007   Procedure: CARDIAC CATHETERIZATION; Location: [place]; Surgeon: Neoma Laming, MD   CARDIAC CATHETERIZATION Left 10/28/2010   Procedure: CARDIAC CATHETERIZATION; Location: [place]; Surgeon: Neoma Laming, MD   CARDIAC CATHETERIZATION Left 05/31/2013   Procedure: CARDIAC CATHETERIZATION; Location: [place]; Surgeon: Neoma Laming, MD   Carotid arteriogram; 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 N/A 07/18/2006   Location: [facility]   CATARACT EXTRACTION     CERVICAL FUSION N/A    Procedure: ACDF C5-C7   COLONOSCOPY WITH PROPOFOL N/A 10/04/2019   Procedure: COLONOSCOPY WITH BIOPSY;  Surgeon: Lin Landsman, MD;  Location: [place];  Service: Endoscopy;  Laterality: N/A;  Diabetic (borderline) - oral meds priority 3   CORONARY STENT INTERVENTION Left 02/27/2007   Procedure: CORONARY STENT INTERVENTION (3.5 x 15 mm Vision BMS to mRCA); Location: [place]; Surgeon: Kathlyn Sacramento, MD   Endovascular near complete obliteration of enlarging neck remnant of previously treated RIGHT MCA aneurysm using stent assisted coiling N/A 09/03/2010   Location: [facility]   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 N/A 10/03/2006   Location: [facility]   HYSTEROSCOPY WITH D & C N/A 08/04/2021   Procedure: DILATATION AND CURETTAGE /HYSTEROSCOPY;  Surgeon: Homero Fellers, MD;  Location: [facility];  Service: Gynecology;  Laterality: N/A;   POLYPECTOMY N/A 10/04/2019   Procedure: POLYPECTOMY;  Surgeon: Lin Landsman, MD;  Location: [place];  Service:  Endoscopy;  Laterality: N/A;   REVERSE SHOULDER ARTHROPLASTY Right 03/02/2022   Procedure: REVERSE SHOULDER ARTHROPLASTY;  Surgeon: Corky Mull, MD;  Location: [facility];  Service: Orthopedics;  Laterality: Right;   UPPER ESOPHAGEAL ENDOSCOPIC ULTRASOUND (EUS) N/A 04/15/2016   Procedure: UPPER ESOPHAGEAL ENDOSCOPIC ULTRASOUND (EUS);  Surgeon: Holly Bodily, MD;  Location[facility];  Service: Gastroenterology;  Laterality: N/A;    Social History   Socioeconomic History   Marital status: Widowed    Spouse name: Not on file   Number of children: Not on file   Years of education: Not on file   Highest education level: Not on file  Occupational History   Not on file  Tobacco Use   Smoking status: Never   Smokeless tobacco: Never  Vaping Use   Vaping Use: Never used  Substance and Sexual Activity   Alcohol use: Yes    Comment: Occasionally at [employer]  Drug use: No   Sexual activity: Not Currently    Birth control/protection: Post-menopausal  Other Topics Concern   Not on file  Social History Narrative   Not on file   Social Determinants of Health   Financial Resource Strain: Low Risk  (02/12/2022)   Overall Financial Resource Strain (CARDIA)    Difficulty of Paying Living Expenses: Not very hard  Food Insecurity: No Food Insecurity (03/02/2022)   Hunger Vital Sign    Worried About Running Out of Food in the Last Year: Never true    Ran Out of Food in the Last Year: Never true  Transportation Needs: No Transportation Needs (03/02/2022)   PRAPARE - Hydrologist (Medical): No    Lack of Transportation (Non-Medical): No  Physical Activity: Not on file  Stress: No Stress Concern Present (02/26/2022)   [place]    Feeling of Stress : Not at all  Social Connections: Moderately Isolated (02/26/2022)   Social Connection and Isolation Panel [NHANES]    Frequency of Communication  with Friends and Family: More than three times a week    Frequency of Social Gatherings with Friends and Family: More than three times a week    Attends Religious Services: Never    Marine scientist or Organizations: Yes    Attends Archivist Meetings: Never    Marital Status: Widowed  Intimate Partner Violence: Not At Risk (03/02/2022)   Humiliation, Afraid, Rape, and Kick questionnaire    Fear of Current or Ex-Partner: No    Emotionally Abused: No    Physically Abused: No    Sexually Abused: No    Family History  Problem Relation Age of Onset   Breast cancer Paternal Aunt 16   Breast cancer Paternal Aunt    Heart disease Mother    Heart disease Father    Stroke Father    Diabetes Sister    Diabetes Brother     Allergies  Allergen Reactions   Isosorbide Dinitrate Other (See Comments)    Collapse   Amlodipine Swelling   Sulfa Antibiotics Itching   Tizanidine Hcl    Oxycodone Itching   Sulfasalazine Itching    Review of Systems  Constitutional:  Negative for chills, diaphoresis, fever, malaise/fatigue and weight loss.  HENT:  Positive for congestion and hearing loss. Negative for ear pain, nosebleeds, sinus pain, sore throat and tinnitus.   Eyes: Negative.   Respiratory: Negative.    Cardiovascular: Negative.   Gastrointestinal: Negative.   Genitourinary: Negative.   Musculoskeletal:  Positive for joint pain and neck pain. Negative for myalgias.  Skin: Negative.   Neurological: Negative.   Psychiatric/Behavioral: Negative.         Objective:   BP 128/74   Pulse 84   Ht '5\' 1"'$  (1.549 m)   Wt 170 lb (77.1 kg)   SpO2 93%   BMI 32.12 kg/m   Vitals:   08/26/22 1005  BP: 128/74  Pulse: 84  Height: '5\' 1"'$  (1.549 m)  Weight: 170 lb (77.1 kg)  SpO2: 93%  BMI (Calculated): 32.14    Physical Exam Vitals and nursing note reviewed.  Constitutional:      Appearance: Normal appearance.  HENT:     Right Ear: Hearing normal. A middle ear  effusion is present. There is no impacted cerumen. No foreign body. No hemotympanum. Tympanic membrane is not injected, scarred or perforated.     Left Ear:  Hearing normal. A middle ear effusion is present. There is no impacted cerumen. No hemotympanum. Tympanic membrane is not injected, scarred or perforated.  Cardiovascular:     Rate and Rhythm: Normal rate and regular rhythm.     Pulses: Normal pulses.     Heart sounds: Normal heart sounds.  Pulmonary:     Effort: Pulmonary effort is normal.     Breath sounds: Normal breath sounds.  Musculoskeletal:     Left wrist: Tenderness present. No swelling, deformity or crepitus. Normal range of motion. Normal pulse.     Cervical back: Normal range of motion and neck supple.  Skin:    General: Skin is warm and dry.  Neurological:     General: No focal deficit present.     Mental Status: She is alert and oriented to person, place, and time.      Results for orders placed or performed in visit on 08/26/22  POCT CBG (Fasting - Glucose)  Result Value Ref Range   Glucose Fasting, POC 231 (A) 70 - 99 mg/dL    Recent Results (from the past 2160 hour(s))  POCT CBG (Fasting - Glucose)     Status: Abnormal   Collection Time: 08/26/22 10:10 AM  Result Value Ref Range   Glucose Fasting, POC 231 (A) 70 - 99 mg/dL      Assessment & Plan:  Patient will start Flonase nasa spray and Claritin. Wrist splint. May need steroid injection in wrist. Problem List Items Addressed This Visit     Coronary disease   Diabetes ([place])   Relevant Orders   POCT CBG (Fasting - Glucose) (Completed)   Gout   Systemic lupus erythematosus (HCC)   Non-seasonal allergic rhinitis due to pollen - Primary   Relevant Medications   fluticasone (FLONASE) 50 MCG/ACT nasal spray   loratadine (CLARITIN) 10 MG tablet   Essential hypertension, benign   Mixed hyperlipidemia   Rheumatoid arthritis involving multiple sites with positive rheumatoid factor (HCC)   Tendinitis  of left wrist   Follow up as scheduled.  Total time spent: 30 minutes  Perrin Maltese, MD  08/26/2022

## 2022-08-30 ENCOUNTER — Ambulatory Visit: Payer: 59

## 2022-08-30 ENCOUNTER — Telehealth: Payer: Self-pay

## 2022-08-30 DIAGNOSIS — M19111 Post-traumatic osteoarthritis, right shoulder: Secondary | ICD-10-CM | POA: Diagnosis not present

## 2022-08-30 DIAGNOSIS — M48062 Spinal stenosis, lumbar region with neurogenic claudication: Secondary | ICD-10-CM | POA: Diagnosis not present

## 2022-08-30 DIAGNOSIS — Z96611 Presence of right artificial shoulder joint: Secondary | ICD-10-CM | POA: Diagnosis not present

## 2022-08-30 DIAGNOSIS — M654 Radial styloid tenosynovitis [de Quervain]: Secondary | ICD-10-CM | POA: Diagnosis not present

## 2022-08-30 DIAGNOSIS — E119 Type 2 diabetes mellitus without complications: Secondary | ICD-10-CM | POA: Diagnosis not present

## 2022-08-30 DIAGNOSIS — M7581 Other shoulder lesions, right shoulder: Secondary | ICD-10-CM | POA: Diagnosis not present

## 2022-08-30 NOTE — Telephone Encounter (Signed)
Pt did not show for appointment this date. Clinic attempted to contact patient prior to appointment given recent no-shows on preceding 3 appointments. VM left prior to appointment. Pt did not call clinic back, did not show for visit. Pt contacted via telephone again after not showing for visit. As per clinic policy pt will be DC from our services at this time due to multiple consecutive no-shows. Pt has one additional remaining appointment which will be canceled.   2:18 PM, 08/30/22 Etta Grandchild, PT, DPT Physical Therapist - [place] [phone] (Office)

## 2022-09-01 ENCOUNTER — Ambulatory Visit: Payer: 59 | Admitting: Physical Therapy

## 2022-09-02 ENCOUNTER — Other Ambulatory Visit: Payer: Self-pay | Admitting: Internal Medicine

## 2022-09-02 ENCOUNTER — Ambulatory Visit: Payer: 59 | Admitting: Internal Medicine

## 2022-09-02 DIAGNOSIS — I10 Essential (primary) hypertension: Secondary | ICD-10-CM

## 2022-09-02 DIAGNOSIS — E119 Type 2 diabetes mellitus without complications: Secondary | ICD-10-CM

## 2022-09-02 MED ORDER — LOSARTAN POTASSIUM 100 MG PO TABS: 100.0000 mg | ORAL_TABLET | ORAL | 3 refills | Status: DC

## 2022-09-02 MED ORDER — GLIMEPIRIDE 2 MG PO TABS: 2.0000 mg | ORAL_TABLET | ORAL | 3 refills | Status: DC

## 2022-09-07 ENCOUNTER — Telehealth: Payer: Self-pay | Admitting: *Deleted

## 2022-09-07 NOTE — Patient Outreach (Signed)
  Care Coordination   09/07/2022 Name: [name] MRN: [id number] DOB: 1945-09-13   Care Coordination Outreach Attempts:  An unsuccessful telephone outreach was attempted today to offer the patient information about available care coordination services as a benefit of their health plan.   Follow Up Plan:  Additional outreach attempts will be made to offer the patient care coordination information and services.   Encounter Outcome:  No Answer   Care Coordination Interventions:  No, not indicated    Yatziry Deakins, [place] Worker  [facility] Care Management [phone]

## 2022-09-08 ENCOUNTER — Telehealth: Payer: Self-pay | Admitting: *Deleted

## 2022-09-08 ENCOUNTER — Other Ambulatory Visit: Payer: Self-pay | Admitting: Internal Medicine

## 2022-09-08 DIAGNOSIS — I10 Essential (primary) hypertension: Secondary | ICD-10-CM

## 2022-09-08 NOTE — Patient Outreach (Signed)
  Care Coordination   Follow Up Visit Note   09/08/2022 Name: [name] MRN: [id number] DOB: 06-Sep-1945  [name] is a 77 y.o. year old female who sees Perrin Maltese, MD for primary care. I spoke with  [name] by phone today.  What matters to the patients health and wellness today?  In home care services    Goals Addressed             This Visit's Progress    In home care needs       Care Coordination Interventions: Follow up phone call to patient regarding  CAPS determination Phone-assessment completed on  08/04/22 at 10:30am  by Kirk Ruths [phone]-patient approved for CAP services and has been assigned to the Department of Social Services Per Luci Bank, CAPS social worker, plan of care to be completed the first week of April          SDOH assessments and interventions completed:  No     Care Coordination Interventions:  Yes, provided  Interventions Today    Flowsheet Row Most Recent Value  Chronic Disease   Chronic disease during today's visit Other  [severe stenosis]  General Interventions   General Interventions Discussed/Reviewed Communication with  Communication with --  [Robert Linens of the Department of Social Services Community Alternatives Program(CAPS)]  Level of [employer] that patient is approved for CAP services through the Department of Social Services plan of care to start the first week of April]       Follow up plan: Follow up call scheduled for 09/29/22    Encounter Outcome:  Pt. Visit Completed

## 2022-09-08 NOTE — Patient Instructions (Signed)
Visit Information  Thank you for taking time to visit with me today. Please don't hesitate to contact me if I can be of assistance to you.   Following are the goals we discussed today:   Goals Addressed             This Visit's Progress    In home care needs       Care Coordination Interventions: Follow up phone call to patient regarding  CAPS determination Phone-assessment completed on  08/04/22 at 10:30am  by Kirk Ruths [phone] approved for CAP services and has been assigned to the Department of Social Services Per Luci Bank, CAPS social worker, plan of care to be completed the first week of April          Our next appointment is by telephone on 09/29/22 at 11am  Please call the care guide team at [phone] if you need to cancel or reschedule your appointment.   If you are experiencing a Mental Health or [place] or need someone to talk to, please call 911   Patient verbalizes understanding of instructions and care plan provided today and agrees to view in [place]. Active MyChart status and patient understanding of how to access instructions and care plan via MyChart confirmed with patient.     Telephone follow up appointment with care management team member scheduled for: 09/29/22  Elliot Gurney, [place] Worker  [facility] Care Management [phone]

## 2022-09-11 ENCOUNTER — Other Ambulatory Visit: Payer: Self-pay | Admitting: Internal Medicine

## 2022-09-11 ENCOUNTER — Other Ambulatory Visit: Payer: Self-pay | Admitting: Cardiovascular Disease

## 2022-09-11 DIAGNOSIS — E782 Mixed hyperlipidemia: Secondary | ICD-10-CM

## 2022-09-11 DIAGNOSIS — J4520 Mild intermittent asthma, uncomplicated: Secondary | ICD-10-CM

## 2022-09-14 ENCOUNTER — Telehealth: Payer: Self-pay | Admitting: Neurosurgery

## 2022-09-14 DIAGNOSIS — Z794 Long term (current) use of insulin: Secondary | ICD-10-CM | POA: Diagnosis not present

## 2022-09-14 DIAGNOSIS — E119 Type 2 diabetes mellitus without complications: Secondary | ICD-10-CM | POA: Diagnosis not present

## 2022-09-14 MED ORDER — METHOCARBAMOL 500 MG PO TABS: 500.0000 mg | ORAL_TABLET | Freq: Four times a day (QID) | ORAL | 1 refills | Status: DC | PRN

## 2022-09-14 NOTE — Telephone Encounter (Signed)
Patient notified of med refill

## 2022-09-14 NOTE — Telephone Encounter (Signed)
Refill sent.

## 2022-09-14 NOTE — Telephone Encounter (Signed)
Methocarbamol 500mg  she takes it 2-3 times a day She needs refill to get her through to her appt in June.  [facility] on [address]

## 2022-09-15 DIAGNOSIS — E1151 Type 2 diabetes mellitus with diabetic peripheral angiopathy without gangrene: Secondary | ICD-10-CM | POA: Diagnosis not present

## 2022-09-15 DIAGNOSIS — R7989 Other specified abnormal findings of blood chemistry: Secondary | ICD-10-CM | POA: Diagnosis not present

## 2022-09-15 DIAGNOSIS — I10 Essential (primary) hypertension: Secondary | ICD-10-CM | POA: Diagnosis not present

## 2022-09-15 DIAGNOSIS — N184 Chronic kidney disease, stage 4 (severe): Secondary | ICD-10-CM | POA: Diagnosis not present

## 2022-09-15 DIAGNOSIS — I70209 Unspecified atherosclerosis of native arteries of extremities, unspecified extremity: Secondary | ICD-10-CM | POA: Diagnosis not present

## 2022-09-15 DIAGNOSIS — R799 Abnormal finding of blood chemistry, unspecified: Secondary | ICD-10-CM | POA: Diagnosis not present

## 2022-09-16 ENCOUNTER — Ambulatory Visit (INDEPENDENT_AMBULATORY_CARE_PROVIDER_SITE_OTHER): Payer: 59 | Admitting: Internal Medicine

## 2022-09-16 ENCOUNTER — Encounter: Payer: Self-pay | Admitting: Internal Medicine

## 2022-09-16 VITALS — BP 128/70 | HR 78 | Ht 61.0 in | Wt 170.8 lb

## 2022-09-16 DIAGNOSIS — M778 Other enthesopathies, not elsewhere classified: Secondary | ICD-10-CM | POA: Diagnosis not present

## 2022-09-16 DIAGNOSIS — I10 Essential (primary) hypertension: Secondary | ICD-10-CM

## 2022-09-16 DIAGNOSIS — I25118 Atherosclerotic heart disease of native coronary artery with other forms of angina pectoris: Secondary | ICD-10-CM

## 2022-09-16 DIAGNOSIS — J301 Allergic rhinitis due to pollen: Secondary | ICD-10-CM | POA: Diagnosis not present

## 2022-09-16 DIAGNOSIS — E1165 Type 2 diabetes mellitus with hyperglycemia: Secondary | ICD-10-CM | POA: Diagnosis not present

## 2022-09-16 DIAGNOSIS — E782 Mixed hyperlipidemia: Secondary | ICD-10-CM | POA: Diagnosis not present

## 2022-09-16 LAB — POCT CBG (FASTING - GLUCOSE)-MANUAL ENTRY: Glucose Fasting, POC: 240 mg/dL — AB (ref 70–99)

## 2022-09-16 NOTE — Progress Notes (Signed)
Established Patient Office Visit  Subjective:  Patient ID: [name], female    DOB: 05-03-1946  Age: 77 y.o. MRN: [id number]  Chief Complaint  Patient presents with   Follow-up    3 month follow up    Patient comes in for her follow-up today.  She was here last week with complaints of worsening allergic rhinitis with sinus and nasal congestion, and ear pressure. She was started on Flonase nasal spray and antihistamine, and is feeling much better now. She had also complained of her left wrist pain and tenderness.  She has been evaluated for that by her orthopedic and has been advised to wear a wrist splint which she is not wearing today.  However she is feeling much better and does not have any other complaints.  She will return fasting for her blood work.    No other concerns at this time.   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 b.)07/18/2006 -endovas oblit complex ACOM aneur. c.) Endovas Tx of 6.8x33mm RMCA aneur. d.) 3.5x25mm remnant RMCA aneur 2/2 coil compaction. e.) Interval 3.2x3.59mm saccular outpouching in ACOM c/w mild recannulization in neck. 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 g.) 3.7x60mm remnant of prev Tx'd ACOM aneur -failed embol 02/24/2012.   CKD (chronic kidney disease), stage IV (HCC)    a.) solitary functioning kidney on the RIGHT   Complication of anesthesia    a.) delayed emergence   Coronary artery disease 02/23/2007   a.) LHC 02/23/2007 --> EF 50%; 30% pLAD, 70% mRCA --> planned for staged PCI. b.) PCI 02/27/2007: EF 60%; 3.5 x 15 mm Vision BMS to 80% mRCA. b.) LHC 07/24/2007: EF 60%; minor irregs; no occlusive CAD; no intervention. c.) LHC 10/28/2010: EF 60%; 30% mLAD, LCx with minor luminal irregs, 20% ISR mRCA; no intervention. d.) LHC 05/31/2013: EF 60%; 40% mLAD, 30% ISR m-dRCA; no interventions.    DDD (degenerative disc disease), cervical    a.) s/p ACDF C5-C7; hardware in neck; patient appreciates stiffness and issues with mobility   Diastolic dysfunction    a.)  TTE 07/07/2021: EF 87.8%; normal PASP; trace TR/MR; G1DD.   Elevated hemidiaphragm    a.) LEFT   Headache(784.0)    Heart murmur    HLD (hyperlipidemia)    HOH (hard of hearing)    Has hearing aids, doesn't wear   Hyperparathyroidism due to renal insufficiency (HCC)    Hypertension    Incomplete right bundle branch block (RBBB)    Insomnia    a.) takes zolpidem   Long term (current) use of immunomodulator    a.) on DMARD therapy (hydroxychloriquine) for RA/SLE Dx.   Long term current use of antithrombotics/antiplatelets    a.) clopidogrel   Multiple acquired cysts of kidney    OSA (obstructive sleep apnea)    a.) does NOT use nocturnal pap therapy   Pancreatic cyst    Pneumonia    RBBB (right bundle branch block)    Rheumatoid arthritis ([place])    a.) on DMARD; hydroxychloriquine   Systemic lupus erythematosus ([place])    T2DM (type 2 diabetes mellitus) ([place])    Wears dentures    partial lower    Past Surgical History:  Procedure Laterality Date   ANEURYSM COILING  08/2013   Attempted embolization of previously treated ACOM aneurysm; unsuccessful N/A 01/28/2012  Location: [facility]   BACK SURGERY     BARTHOLIN CYST MARSUPIALIZATION     BICEPT TENODESIS Right 03/02/2022   Procedure: BICEPS TENODESIS;  Surgeon: Corky Mull, MD;  Location: [facility];  Service: Orthopedics;  Laterality: Right;   BREAST CYST ASPIRATION Right 01/25/2012   FNA neg.   BREAST EXCISIONAL BIOPSY Left 06/26/2007   neg   BUNIONECTOMY Bilateral 2022   CARDIAC CATHETERIZATION Left 02/23/2007   Procedure: CARDIAC CATHETERIZATION; Location: [place]; Surgeon: Neoma Laming, MD   CARDIAC CATHETERIZATION Left 07/31/2007   Procedure: CARDIAC CATHETERIZATION; Location: [place]; Surgeon: Neoma Laming, MD   CARDIAC CATHETERIZATION Left  10/28/2010   Procedure: CARDIAC CATHETERIZATION; Location: [facility]; Surgeon: Neoma Laming, MD   CARDIAC CATHETERIZATION Left 05/31/2013   Procedure: CARDIAC CATHETERIZATION; Location: [place]; Surgeon: Neoma Laming, MD   Carotid arteriogram; 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 N/A 07/18/2006   Location: [facility]   CATARACT EXTRACTION     CERVICAL FUSION N/A    Procedure: ACDF C5-C7   COLONOSCOPY WITH PROPOFOL N/A 10/04/2019   Procedure: COLONOSCOPY WITH BIOPSY;  Surgeon: Lin Landsman, MD;  Location: [place];  Service: Endoscopy;  Laterality: N/A;  Diabetic (borderline) - oral meds priority 3   CORONARY STENT INTERVENTION Left 02/27/2007   Procedure: CORONARY STENT INTERVENTION (3.5 x 15 mm Vision BMS to mRCA); Location: [place]; Surgeon: Kathlyn Sacramento, MD   Endovascular near complete obliteration of enlarging neck remnant of previously treated RIGHT MCA aneurysm using stent assisted coiling N/A 09/03/2010   Location: [facility]   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 N/A 10/03/2006   Location: [facility]   HYSTEROSCOPY WITH D & C N/A 08/04/2021   Procedure: DILATATION AND CURETTAGE /HYSTEROSCOPY;  Surgeon: Homero Fellers, MD;  Location: [facility];  Service: Gynecology;  Laterality: N/A;   POLYPECTOMY N/A 10/04/2019   Procedure: POLYPECTOMY;  Surgeon: Lin Landsman, MD;  Location: [place];  Service: Endoscopy;  Laterality: N/A;   REVERSE SHOULDER ARTHROPLASTY Right 03/02/2022   Procedure: REVERSE SHOULDER ARTHROPLASTY;  Surgeon: Corky Mull, MD;  Location: [facility];  Service: Orthopedics;  Laterality: Right;   UPPER ESOPHAGEAL ENDOSCOPIC ULTRASOUND (EUS) N/A 04/15/2016   Procedure: UPPER ESOPHAGEAL ENDOSCOPIC ULTRASOUND (EUS);  Surgeon: Holly Bodily, MD;  Location: [facility];  Service: Gastroenterology;  Laterality: N/A;    Social History    Socioeconomic History   Marital status: Widowed    Spouse name: Not on file   Number of children: Not on file   Years of education: Not on file   Highest education level: Not on file  Occupational History   Not on file  Tobacco Use   Smoking status: Never   Smokeless tobacco: Never  Vaping Use   Vaping Use: Never used  Substance and Sexual Activity   Alcohol use: Yes    Comment: Occasionally at christmas   Drug use: No   Sexual activity: Not Currently    Birth control/protection: Post-menopausal  Other Topics Concern   Not on file  Social History Narrative   Not on file   Social Determinants of Health   Financial Resource Strain: Low Risk  (02/12/2022)   Overall Financial Resource Strain (CARDIA)    Difficulty of Paying Living Expenses: Not very hard  Food Insecurity: No Food Insecurity (03/02/2022)   Hunger Vital Sign    Worried About Running Out of Food in the Last Year: Never true  Ran Out of Food in the Last Year: Never true  Transportation Needs: No Transportation Needs (03/02/2022)   PRAPARE - Hydrologist (Medical): No    Lack of Transportation (Non-Medical): No  Physical Activity: Not on file  Stress: No Stress Concern Present (02/26/2022)   [place]    Feeling of Stress : Not at all  Social Connections: Moderately Isolated (02/26/2022)   Social Connection and Isolation Panel [NHANES]    Frequency of Communication with Friends and Family: More than three times a week    Frequency of Social Gatherings with Friends and Family: More than three times a week    Attends Religious Services: Never    Marine scientist or Organizations: Yes    Attends Archivist Meetings: Never    Marital Status: Widowed  Intimate Partner Violence: Not At Risk (03/02/2022)   Humiliation, Afraid, Rape, and Kick questionnaire    Fear of Current or Ex-Partner: No    Emotionally  Abused: No    Physically Abused: No    Sexually Abused: No    Family History  Problem Relation Age of Onset   Breast cancer Paternal Aunt 66   Breast cancer Paternal Aunt    Heart disease Mother    Heart disease Father    Stroke Father    Diabetes Sister    Diabetes Brother     Allergies  Allergen Reactions   Isosorbide Dinitrate Other (See Comments)    Collapse   Amlodipine Swelling   Sulfa Antibiotics Itching   Tizanidine Hcl    Oxycodone Itching   Sulfasalazine Itching    Review of Systems  Constitutional: Negative.  Negative for chills, diaphoresis, fever, malaise/fatigue and weight loss.  HENT: Negative.  Negative for congestion, ear discharge, ear pain, sinus pain and sore throat.   Eyes:  Negative for blurred vision, double vision, photophobia and pain.  Respiratory:  Negative for cough, hemoptysis, sputum production, shortness of breath and wheezing.   Cardiovascular:  Negative for chest pain, palpitations, orthopnea and claudication.  Gastrointestinal:  Negative for abdominal pain, blood in stool, constipation, heartburn and nausea.  Genitourinary:  Negative for dysuria, flank pain, frequency and hematuria.  Musculoskeletal:  Positive for back pain, joint pain and myalgias. Negative for neck pain.  Skin: Negative.   Neurological:  Negative for dizziness, tingling, weakness and headaches.  Psychiatric/Behavioral:  Negative for depression. The patient is not nervous/anxious.        Objective:   BP 128/70   Pulse 78   Ht 5\' 1"  (1.549 m)   Wt 170 lb 12.8 oz (77.5 kg)   SpO2 99%   BMI 32.27 kg/m   Vitals:   09/16/22 1301  BP: 128/70  Pulse: 78  Height: 5\' 1"  (1.549 m)  Weight: 170 lb 12.8 oz (77.5 kg)  SpO2: 99%  BMI (Calculated): 32.29    Physical Exam Vitals and nursing note reviewed.  Constitutional:      Appearance: Normal appearance.  HENT:     Right Ear: Tympanic membrane and ear canal normal.     Left Ear: Tympanic membrane and ear canal  normal.  Cardiovascular:     Rate and Rhythm: Normal rate and regular rhythm.     Pulses: Normal pulses.     Heart sounds: Normal heart sounds.  Pulmonary:     Effort: Pulmonary effort is normal.     Breath sounds: Normal breath sounds.  Abdominal:  General: Abdomen is flat. Bowel sounds are normal. There is no distension.     Palpations: Abdomen is soft. There is no mass.  Musculoskeletal:        General: No tenderness. Normal range of motion.     Cervical back: Normal range of motion and neck supple.  Skin:    General: Skin is warm and dry.  Neurological:     General: No focal deficit present.     Mental Status: She is alert.  Psychiatric:        Mood and Affect: Mood normal.        Behavior: Behavior normal.      Results for orders placed or performed in visit on 09/16/22  POCT CBG (Fasting - Glucose)  Result Value Ref Range   Glucose Fasting, POC 240 (A) 70 - 99 mg/dL    Recent Results (from the past 2160 hour(s))  POCT CBG (Fasting - Glucose)     Status: Abnormal   Collection Time: 08/26/22 10:10 AM  Result Value Ref Range   Glucose Fasting, POC 231 (A) 70 - 99 mg/dL  POCT CBG (Fasting - Glucose)     Status: Abnormal   Collection Time: 09/16/22  1:05 PM  Result Value Ref Range   Glucose Fasting, POC 240 (A) 70 - 99 mg/dL      Assessment & Plan:  Patient to continue taking all her medications as such.  Will get fasting labs. Problem List Items Addressed This Visit     Coronary disease   Diabetes ([place]) - Primary   Relevant Orders   POCT CBG (Fasting - Glucose) (Completed)   Seasonal allergic rhinitis due to pollen   Essential hypertension, benign   Mixed hyperlipidemia   Tendinitis of left wrist    Return in about 3 months (around 12/17/2022).   Total time spent: 25 minutes  Perrin Maltese, MD  09/16/2022

## 2022-09-21 ENCOUNTER — Telehealth: Payer: Self-pay | Admitting: *Deleted

## 2022-09-21 NOTE — Patient Outreach (Signed)
  Care Coordination   Follow Up Visit Note   09/21/2022 Name: [name] MRN: [id number] DOB: 26-Oct-1945  [name] is a 77 y.o. year old female who sees Perrin Maltese, MD for primary care. I spoke with  [name] by phone today.  What matters to the patients health and wellness today? In home care needs. Per patient, hip pain progressing , daughter available to assist   Goals Addressed             This Visit's Progress    In home care needs       Care Coordination Interventions: Patient approved for CAP services, collaboration phone call from [employer] confirming anticipate a call from the Department of Social Services by the end of the month to begin services          SDOH assessments and interventions completed:  No     Care Coordination Interventions:  Yes, provided  Interventions Today    Flowsheet Row Most Recent Value  Chronic Disease   Chronic disease during today's visit Other  [severe stenosis]  General Interventions   General Interventions Discussed/Reviewed General Interventions Reviewed, [employer], Communication with  Communication with --  Programmer, systems from the [facility] confirmed that patient is next on the waiting list to be called for services-should be scheduled for initial assessment before the  end of the month]  Level of Care Personal Care Services       Follow up plan: Follow up call scheduled for 10/12/22    Encounter Outcome:  Pt. Visit Completed

## 2022-09-21 NOTE — Patient Instructions (Signed)
Visit Information  Thank you for taking time to visit with me today. Please don't hesitate to contact me if I can be of assistance to you.   Following are the goals we discussed today:   Goals Addressed             This Visit's Progress    In home care needs       Care Coordination Interventions: Patient approved for CAP services, collaboration phone call from [employer] confirming anticipate a call from the Department of Social Services by the end of the month to begin services          Our next appointment is by telephone on 10/12/22 at 1pm  Please call the care guide team at [phone] if you need to cancel or reschedule your appointment.   If you are experiencing a Mental Health or [place] or need someone to talk to, please call 911   Patient verbalizes understanding of instructions and care plan provided today and agrees to view in [place]. Active MyChart status and patient understanding of how to access instructions and care plan via MyChart confirmed with patient.     Telephone follow up appointment with care management team member scheduled for: 10/12/22  Elliot Gurney, [place] Worker  [facility] Care Management [phone]

## 2022-09-22 NOTE — Patient Outreach (Signed)
  Care Coordination   Follow Up Visit Note   09/22/2022 Late Entry Name: JOANELL PAVLICEK MRN: [id number] DOB: 02/08/1946  [name] is a 77 y.o. year old female who sees Perrin Maltese, MD for primary care. I spoke with  [name] by phone on 09/21/22.  What matters to the patients health and wellness today?  Personal care services   Goals Addressed             This Visit's Progress    In home care needs       Care Coordination Interventions: Patient approved for CAP (Community Alternative Program) collaboration phone call from [employer] confirming anticipate a call from the Department of Social Services by the end of the month to begin services          SDOH assessments and interventions completed:  No     Care Coordination Interventions:  Yes, provided   Interventions Today    Flowsheet Row Most Recent Value  Chronic Disease   Chronic disease during today's visit Other  [severe stenosis]  General Interventions   General Interventions Discussed/Reviewed General Interventions Discussed, Emergency planning/management officer, Communication with  Communication with --  [collaoration phone call from CAP worker Rober Lennons who states that patient is next on the list to begin services, initial intake to take place before the end of the month]  Level of Care Personal Care Services         Follow up plan: Follow up call scheduled for 10/12/22    Encounter Outcome:  Pt. Visit Completed

## 2022-09-22 NOTE — Patient Instructions (Signed)
Visit Information  Thank you for taking time to visit with me today. Please don't hesitate to contact me if I can be of assistance to you.   Following are the goals we discussed today:   Goals Addressed             This Visit's Progress    In home care needs       Care Coordination Interventions: Patient approved for CAP (Community Alternative Program) collaboration phone call from [employer] confirming anticipate a call from the Department of Social Services by the end of the month to begin services          Our next appointment is by telephone on 10/12/22 at 11am  Please call the care guide team at [phone] if you need to cancel or reschedule your appointment.   If you are experiencing a Mental Health or [place] or need someone to talk to, please call 911   Patient verbalizes understanding of instructions and care plan provided today and agrees to view in [place]. Active MyChart status and patient understanding of how to access instructions and care plan via MyChart confirmed with patient.     Telephone follow up appointment with care management team member scheduled for: 10/12/22   Elliot Gurney, [place] Worker [facility] Care Management [phone]

## 2022-09-27 ENCOUNTER — Other Ambulatory Visit: Payer: Self-pay | Admitting: Internal Medicine

## 2022-09-29 ENCOUNTER — Encounter: Payer: Self-pay | Admitting: *Deleted

## 2022-10-12 ENCOUNTER — Ambulatory Visit: Payer: Self-pay | Admitting: *Deleted

## 2022-10-12 NOTE — Patient Outreach (Signed)
  Care Coordination   Follow Up Visit Note   10/12/2022 Name: [name] MRN: [id number] DOB: [birth year]  [name] is a 77 y.o. year old female who sees Margaretann Loveless, MD for primary care. I spoke with  [name] by phone today.  What matters to the patients health and wellness today?  Patient states that she is now established with CAP services, however is visiting family in [place], [place] with an expected return in June or July for her spinal surgery. Patient confirms being aware of local urgent care  facilities that she can go to for care in the event of an emergency. Patient verbalized having no additional community resource needs at this time.  Goals Addressed             This Visit's Progress    In home care needs       Care Coordination Interventions: Patient approved for CAP (Community Alternative Program)  Caesar Bookman confirmed as patient's CAP worker through the Department of Social Services  Personal care services to be provided by [employer] by [employer]          SDOH assessments and interventions completed:  No     Care Coordination Interventions:  Yes, provided  Interventions Today    Flowsheet Row Most Recent Value  Chronic Disease   Chronic disease during today's visit Other  [severe stenosis]  General Interventions   General Interventions Discussed/Reviewed General Interventions Reviewed, [employer], Level of Care  Level of Care Personal Care Services  [Patient approved for CAP services through the department of social services-initial visit completed, PCS services through [facility]]       Follow up plan: No further intervention required.   Encounter Outcome:  Pt. Visit Completed

## 2022-10-12 NOTE — Patient Instructions (Signed)
Visit Information  Thank you for taking time to visit with me today. Please don't hesitate to contact me if I can be of assistance to you.   Following are the goals we discussed today:   Goals Addressed             This Visit's Progress    COMPLETED: In home care needs       Care Coordination Interventions: Patient approved for CAP (Community Alternative Program)  [name] confirmed as patient's CAP worker through the Department of Social Services  Personal care services to be provided by [employer] by [facility]          If you are experiencing a Mental Health or Behavioral Health Crisis or need someone to talk to, please call 911   Patient verbalizes understanding of instructions and care plan provided today and agrees to view in [place]. Active MyChart status and patient understanding of how to access instructions and care plan via MyChart confirmed with patient.     No further follow up required: patient to now be followed by CAP services  Cathy Czech, LCSW Clinical Social Worker  [facility] Care Management [phone]

## 2022-11-04 ENCOUNTER — Telehealth: Payer: Self-pay | Admitting: Internal Medicine

## 2022-11-04 DIAGNOSIS — U071 COVID-19: Secondary | ICD-10-CM | POA: Diagnosis not present

## 2022-11-04 DIAGNOSIS — R059 Cough, unspecified: Secondary | ICD-10-CM | POA: Diagnosis not present

## 2022-11-04 DIAGNOSIS — R0981 Nasal congestion: Secondary | ICD-10-CM | POA: Diagnosis not present

## 2022-11-04 DIAGNOSIS — J029 Acute pharyngitis, unspecified: Secondary | ICD-10-CM | POA: Diagnosis not present

## 2022-11-04 DIAGNOSIS — I10 Essential (primary) hypertension: Secondary | ICD-10-CM | POA: Diagnosis not present

## 2022-11-04 NOTE — Telephone Encounter (Signed)
Patient called and she is currently at urgent care and tested positive for covid. Wants to know if okay for the doctor to prescribe her Paxlovid. Spoke to Dr. Welton Flakes who said legeverio is safer for her. Patient notified and informed nurse at the urgent care.  [phone]

## 2022-11-18 DIAGNOSIS — E119 Type 2 diabetes mellitus without complications: Secondary | ICD-10-CM | POA: Diagnosis not present

## 2022-11-21 ENCOUNTER — Other Ambulatory Visit: Payer: Self-pay | Admitting: Internal Medicine

## 2022-11-21 DIAGNOSIS — J301 Allergic rhinitis due to pollen: Secondary | ICD-10-CM

## 2022-11-25 ENCOUNTER — Ambulatory Visit
Admission: RE | Admit: 2022-11-25 | Discharge: 2022-11-25 | Disposition: A | Discharge status: Home / Self Care | Payer: 59 | Source: Ambulatory Visit | Attending: Neurosurgery | Admitting: Neurosurgery

## 2022-11-25 DIAGNOSIS — M4316 Spondylolisthesis, lumbar region: Secondary | ICD-10-CM | POA: Diagnosis not present

## 2022-11-25 DIAGNOSIS — M5136 Other intervertebral disc degeneration, lumbar region: Secondary | ICD-10-CM | POA: Diagnosis not present

## 2022-11-25 DIAGNOSIS — M545 Low back pain, unspecified: Secondary | ICD-10-CM | POA: Diagnosis not present

## 2022-11-26 ENCOUNTER — Ambulatory Visit: Payer: Self-pay

## 2022-11-26 NOTE — Patient Outreach (Signed)
  Care Coordination   Follow Up Visit Note   11/26/2022 Name: [name] MRN: [id number] DOB: 1945/10/31  [name] is a 77 y.o. year old female who sees Cathy Loveless, MD for primary care. I spoke with  [name] by phone today.  What matters to the patients health and wellness today?   Lumbar stenosis:  Patient states she had her MRI done and is scheduled to see surgeon on 12/09/22.  She reports her pain level sitting is a 3 and goes up if she is ambulating.  Patient reports being approved for CAP services.    Goals Addressed             This Visit's Progress    Patient Stated:  " I want to manage my arthritis pain"       Interventions Today    Flowsheet Row Most Recent Value  Chronic Disease   Chronic disease during today's visit Other  [lumbar stenosis]  General Interventions   General Interventions Discussed/Reviewed General Interventions Reviewed, Doctor Visits  [evaluation of current treatment plan for lumbar stenosis and patients adherence to plan as established by provider. Assessed pain level.]  Doctor Visits Discussed/Reviewed Doctor Visits Reviewed  [reviewed scheduled/ upcoming provider visits. Confirmed if patient had MRI and has follow up appointment with surgeon]  Education Interventions   Education Provided Provided Education  [Advised to consider using heat/ ice to assist with pain relief]  Pharmacy Interventions   Pharmacy Dicussed/Reviewed Pharmacy Topics Reviewed  [medications reviewed and compliance discussed]              SDOH assessments and interventions completed:  No     Care Coordination Interventions:  Yes, provided   Follow up plan: Follow up call scheduled for 01/06/23    Encounter Outcome:  Pt. Visit Completed   Cathy Ina RN,BSN,CCM [facility] Care Coordination [phone] direct line

## 2022-12-02 ENCOUNTER — Ambulatory Visit (INDEPENDENT_AMBULATORY_CARE_PROVIDER_SITE_OTHER): Payer: 59 | Admitting: Internal Medicine

## 2022-12-02 ENCOUNTER — Ambulatory Visit
Admission: RE | Admit: 2022-12-02 | Discharge: 2022-12-02 | Disposition: A | Discharge status: Home / Self Care | Payer: 59 | Source: Ambulatory Visit | Attending: Internal Medicine | Admitting: Internal Medicine

## 2022-12-02 ENCOUNTER — Encounter: Payer: Self-pay | Admitting: Internal Medicine

## 2022-12-02 ENCOUNTER — Ambulatory Visit
Admission: RE | Admit: 2022-12-02 | Discharge: 2022-12-02 | Disposition: A | Discharge status: Home / Self Care | Payer: 59 | Attending: Internal Medicine | Admitting: Internal Medicine

## 2022-12-02 VITALS — BP 200/102 | HR 84 | Ht 62.0 in | Wt 178.4 lb

## 2022-12-02 DIAGNOSIS — R509 Fever, unspecified: Secondary | ICD-10-CM | POA: Diagnosis not present

## 2022-12-02 DIAGNOSIS — E782 Mixed hyperlipidemia: Secondary | ICD-10-CM

## 2022-12-02 DIAGNOSIS — U099 Post covid-19 condition, unspecified: Secondary | ICD-10-CM | POA: Diagnosis not present

## 2022-12-02 DIAGNOSIS — I10 Essential (primary) hypertension: Secondary | ICD-10-CM | POA: Diagnosis not present

## 2022-12-02 DIAGNOSIS — M3214 Glomerular disease in systemic lupus erythematosus: Secondary | ICD-10-CM

## 2022-12-02 DIAGNOSIS — E1165 Type 2 diabetes mellitus with hyperglycemia: Secondary | ICD-10-CM

## 2022-12-02 DIAGNOSIS — R053 Chronic cough: Secondary | ICD-10-CM

## 2022-12-02 DIAGNOSIS — R059 Cough, unspecified: Secondary | ICD-10-CM | POA: Diagnosis not present

## 2022-12-02 DIAGNOSIS — M0579 Rheumatoid arthritis with rheumatoid factor of multiple sites without organ or systems involvement: Secondary | ICD-10-CM

## 2022-12-02 DIAGNOSIS — R051 Acute cough: Secondary | ICD-10-CM

## 2022-12-02 DIAGNOSIS — M320 Drug-induced systemic lupus erythematosus: Secondary | ICD-10-CM | POA: Diagnosis not present

## 2022-12-02 MED ORDER — AMOXICILLIN-POT CLAVULANATE 875-125 MG PO TABS: 1.0000 | ORAL_TABLET | Freq: Two times a day (BID) | ORAL | 0 refills | Status: DC

## 2022-12-02 MED ORDER — METHYLPREDNISOLONE 4 MG PO TBPK
ORAL_TABLET | ORAL | 0 refills | Status: DC
Start: 2022-12-02 — End: 2022-12-16

## 2022-12-02 NOTE — Progress Notes (Signed)
Patient notified

## 2022-12-02 NOTE — Progress Notes (Signed)
Established Patient Office Visit  Subjective:  Patient ID: [name], female    DOB: 02/09/1946  Age: 77 y.o. MRN: [id number]  Chief Complaint  Patient presents with   URI    SOB, cough, runny nose, fever, cold chills.    Patient comes in with complaints of productive cough, yellow-green sputum, shortness of breath, chest tightness for the last several days.  Patient was diagnosed with COVID infection while she was visiting relatives in [place] on Nov 04, 2022.  She was given Lagevrio which she has completed.  She continues to have a cough but since her return from [place] it has gotten worse and she is feeling more tired.  No fever or chills, no nausea or vomiting, and no diarrhea or constipation.  Her appetite is still poor and she admits she is not drinking enough water. Will check a chest x-ray and start her on p.o. antibiotics.  As well as Medrol Dosepak.  URI  Associated symptoms include congestion, coughing, sinus pain and wheezing. Pertinent negatives include no abdominal pain, chest pain, diarrhea, ear pain, joint pain, nausea, neck pain, sore throat or vomiting.    No other concerns at this time.   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 b.)07/18/2006 -endovas oblit complex ACOM aneur. c.) Endovas Tx of 6.8x42mm RMCA aneur. d.) 3.5x29mm remnant RMCA aneur 2/2 coil compaction. e.) Interval 3.2x3.62mm saccular outpouching in ACOM c/w mild recannulization in neck. 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 g.) 3.7x57mm remnant of prev Tx'd ACOM aneur -failed embol 02/24/2012.   CKD (chronic kidney disease), stage IV (HCC)    a.) solitary functioning kidney on the RIGHT   Complication of anesthesia    a.) delayed emergence   Coronary artery disease 02/23/2007   a.) LHC 02/23/2007 --> EF 50%; 30% pLAD, 70% mRCA --> planned for  staged PCI. b.) PCI 02/27/2007: EF 60%; 3.5 x 15 mm Vision BMS to 80% mRCA. b.) LHC 07/24/2007: EF 60%; minor irregs; no occlusive CAD; no intervention. c.) LHC 10/28/2010: EF 60%; 30% mLAD, LCx with minor luminal irregs, 20% ISR mRCA; no intervention. d.) LHC 05/31/2013: EF 60%; 40% mLAD, 30% ISR m-dRCA; no interventions.   DDD (degenerative disc disease), cervical    a.) s/p ACDF C5-C7; hardware in neck; patient appreciates stiffness and issues with mobility   Diastolic dysfunction    a.)  TTE 07/07/2021: EF 87.8%; normal PASP; trace TR/MR; G1DD.   Elevated hemidiaphragm    a.) LEFT   Headache(784.0)    Heart murmur    HLD (hyperlipidemia)    HOH (hard of hearing)    Has hearing aids, doesn't wear   Hyperparathyroidism due to renal insufficiency (HCC)    Hypertension    Incomplete right bundle branch block (RBBB)    Insomnia    a.) takes zolpidem   Long term (current) use of immunomodulator    a.) on DMARD therapy (hydroxychloriquine) for RA/SLE Dx.   Long term current use of antithrombotics/antiplatelets    a.) clopidogrel   Multiple acquired cysts of kidney    OSA (obstructive sleep apnea)    a.) does NOT use nocturnal pap therapy   Pancreatic cyst    Pneumonia    RBBB (right bundle branch block)    Rheumatoid arthritis (HCC)    a.) on DMARD; hydroxychloriquine  Systemic lupus erythematosus (HCC)    T2DM (type 2 diabetes mellitus) (HCC)    Wears dentures    partial lower    Past Surgical History:  Procedure Laterality Date   ANEURYSM COILING  08/2013   Attempted embolization of previously treated ACOM aneurysm; unsuccessful N/A 01/28/2012   [facility]   BACK SURGERY     BARTHOLIN CYST MARSUPIALIZATION     BICEPT TENODESIS Right 03/02/2022   Procedure: BICEPS TENODESIS;  Surgeon: Christena Flake, MD;  Location: [facility];  Service: Orthopedics;  Laterality: Right;   BREAST CYST ASPIRATION Right 01/25/2012   FNA neg.   BREAST EXCISIONAL BIOPSY Left  06/26/2007   neg   BUNIONECTOMY Bilateral 2022   CARDIAC CATHETERIZATION Left 02/23/2007   Procedure: CARDIAC CATHETERIZATION; Location: [facility]; Surgeon: Adrian Blackwater, MD   CARDIAC CATHETERIZATION Left 07/31/2007   Procedure: CARDIAC CATHETERIZATION; Location: [facility]; Surgeon: Adrian Blackwater, MD   CARDIAC CATHETERIZATION Left 10/28/2010   Procedure: CARDIAC CATHETERIZATION; Location: [facility]; Surgeon: Adrian Blackwater, MD   CARDIAC CATHETERIZATION Left 05/31/2013   Procedure: CARDIAC CATHETERIZATION; Location: [facility]; Surgeon: Adrian Blackwater, MD   Carotid arteriogram; 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 N/A 07/18/2006   Location: [facility]   CATARACT EXTRACTION     CERVICAL FUSION N/A    Procedure: ACDF C5-C7   COLONOSCOPY WITH PROPOFOL N/A 10/04/2019   Procedure: COLONOSCOPY WITH BIOPSY;  Surgeon: Toney Reil, MD;  [facility];  Service: Endoscopy;  Laterality: N/A;  Diabetic (borderline) - oral meds priority 3   CORONARY STENT INTERVENTION Left 02/27/2007   Procedure: CORONARY STENT INTERVENTION (3.5 x 15 mm Vision BMS to mRCA); Location: [facility]; Surgeon: Lorine Bears, MD   Endovascular near complete obliteration of enlarging neck remnant of previously treated RIGHT MCA aneurysm using stent assisted coiling N/A 09/03/2010   Location: [facility]   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 N/A 10/03/2006   Location: [facility]   HYSTEROSCOPY WITH D & C N/A 08/04/2021   Procedure: DILATATION AND CURETTAGE /HYSTEROSCOPY;  Surgeon: Natale Milch, MD;  Location: [facility];  Service: Gynecology;  Laterality: N/A;   POLYPECTOMY N/A 10/04/2019   Procedure: POLYPECTOMY;  Surgeon: Toney Reil, MD;  Location: [facility];  Service: Endoscopy;  Laterality: N/A;   REVERSE SHOULDER ARTHROPLASTY Right 03/02/2022   Procedure: REVERSE SHOULDER ARTHROPLASTY;  Surgeon: Christena Flake, MD;  Location: [facility];  Service: Orthopedics;  Laterality: Right;   UPPER ESOPHAGEAL ENDOSCOPIC ULTRASOUND (EUS) N/A 04/15/2016   Procedure: UPPER ESOPHAGEAL ENDOSCOPIC ULTRASOUND (EUS);  Surgeon: Bearl Mulberry, MD;  Location: [facility];  Service: Gastroenterology;  Laterality: N/A;    Social History   Socioeconomic History   Marital status: Widowed    Spouse name: Not on file   Number of children: Not on file   Years of education: Not on file   Highest education level: Not on file  Occupational History   Not on file  Tobacco Use   Smoking status: Never   Smokeless tobacco: Never  Vaping Use   Vaping Use: Never used  Substance and Sexual Activity   Alcohol use: Yes    Comment: Occasionally at christmas   Drug use: No   Sexual activity: Not Currently    Birth control/protection: Post-menopausal  Other Topics Concern   Not on file  Social History Narrative   Not on file   Social Determinants of Health   Financial Resource  Strain: Low Risk  (02/12/2022)   Overall Financial Resource Strain (CARDIA)    Difficulty of Paying Living Expenses: Not very hard  Food Insecurity: No Food Insecurity (03/02/2022)   Hunger Vital Sign    Worried About Running Out of Food in the Last Year: Never true    Ran Out of Food in the Last Year: Never true  Transportation Needs: No Transportation Needs (03/02/2022)   PRAPARE - Administrator, Civil Service (Medical): No    Lack of Transportation (Non-Medical): No  Physical Activity: Not on file  Stress: No Stress Concern Present (02/26/2022)   [employer] of Occupational Health - Occupational Stress Questionnaire    Feeling of Stress : Not at all  Social Connections: Moderately Isolated (02/26/2022)   Social Connection and Isolation Panel [NHANES]    Frequency of Communication with Friends and Family: More than three times a week    Frequency of Social Gatherings with Friends and Family: More than three times a  week    Attends Religious Services: Never    Database administrator or Organizations: Yes    Attends Banker Meetings: Never    Marital Status: Widowed  Intimate Partner Violence: Not At Risk (03/02/2022)   Humiliation, Afraid, Rape, and Kick questionnaire    Fear of Current or Ex-Partner: No    Emotionally Abused: No    Physically Abused: No    Sexually Abused: No    Family History  Problem Relation Age of Onset   Breast cancer Paternal Aunt 20   Breast cancer Paternal Aunt    Heart disease Mother    Heart disease Father    Stroke Father    Diabetes Sister    Diabetes Brother     Allergies  Allergen Reactions   Isosorbide Dinitrate Other (See Comments)    Collapse   Amlodipine Swelling   Sulfa Antibiotics Itching   Tizanidine Hcl    Oxycodone Itching   Sulfasalazine Itching    Review of Systems  Constitutional:  Positive for malaise/fatigue and weight loss. Negative for chills, diaphoresis and fever.  HENT:  Positive for congestion and sinus pain. Negative for ear discharge, ear pain, hearing loss, nosebleeds, sore throat and tinnitus.   Eyes: Negative.   Respiratory:  Positive for cough, sputum production, shortness of breath and wheezing. Negative for hemoptysis and stridor.   Cardiovascular:  Negative for chest pain, palpitations, leg swelling and PND.  Gastrointestinal:  Negative for abdominal pain, blood in stool, constipation, diarrhea, melena, nausea and vomiting.  Genitourinary: Negative.   Musculoskeletal:  Negative for joint pain, myalgias and neck pain.  Neurological: Negative.   Psychiatric/Behavioral:  Negative for depression. The patient is not nervous/anxious.        Objective:   BP (!) 200/102   Pulse 84   Ht 5\' 2"  (1.575 m)   Wt 178 lb 6.4 oz (80.9 kg)   SpO2 98%   BMI 32.63 kg/m   Vitals:   12/02/22 0958  BP: (!) 200/102  Pulse: 84  Height: 5\' 2"  (1.575 m)  Weight: 178 lb 6.4 oz (80.9 kg)  SpO2: 98%  BMI (Calculated):  32.62    Physical Exam Vitals and nursing note reviewed.  Constitutional:      General: She is not in acute distress.    Appearance: Normal appearance. She is not toxic-appearing.  HENT:     Right Ear: Tympanic membrane normal.     Left Ear: Tympanic membrane normal.  Nose: Congestion present. No rhinorrhea.     Mouth/Throat:     Mouth: Mucous membranes are moist.     Pharynx: Oropharynx is clear. No oropharyngeal exudate or posterior oropharyngeal erythema.  Cardiovascular:     Rate and Rhythm: Normal rate and regular rhythm.     Pulses: Normal pulses.     Heart sounds: Murmur heard.  Pulmonary:     Effort: No respiratory distress.     Breath sounds: Wheezing and rhonchi present. No rales.  Chest:     Chest wall: No tenderness.  Abdominal:     General: Bowel sounds are normal.     Tenderness: There is no right CVA tenderness, left CVA tenderness, guarding or rebound.  Musculoskeletal:        General: Normal range of motion.     Cervical back: Normal range of motion and neck supple.     Right lower leg: No edema.     Left lower leg: No edema.  Lymphadenopathy:     Cervical: No cervical adenopathy.  Neurological:     General: No focal deficit present.     Mental Status: She is alert and oriented to person, place, and time.  Psychiatric:        Mood and Affect: Mood normal.        Behavior: Behavior normal.      No results found for any visits on 12/02/22.  Recent Results (from the past 2160 hour(s))  POCT CBG (Fasting - Glucose)     Status: Abnormal   Collection Time: 09/16/22  1:05 PM  Result Value Ref Range   Glucose Fasting, POC 240 (A) 70 - 99 mg/dL      Assessment & Plan:  Chest x-ray today shows no pneumonia. Patient will take Augmentin and prednisone Dosepak.  She will continue using her inhaler at home.  Advised to rest, fluids and Tylenol as needed.  Patient will call or go to ED if feels worse. Problem List Items Addressed This Visit     Diabetes  (HCC)   Systemic lupus erythematosus (HCC)   Essential hypertension, benign   Mixed hyperlipidemia   Rheumatoid arthritis involving multiple sites with positive rheumatoid factor (HCC)   Relevant Medications   methylPREDNISolone (MEDROL DOSEPAK) 4 MG TBPK tablet   Acute cough - Primary   Relevant Medications   amoxicillin-clavulanate (AUGMENTIN) 875-125 MG tablet   methylPREDNISolone (MEDROL DOSEPAK) 4 MG TBPK tablet   Other Relevant Orders   DG Chest 2 View (Completed)   Post-COVID chronic cough    Return in about 1 week (around 12/09/2022).   Total time spent: 30 minutes  Margaretann Loveless, MD  12/02/2022   This document may have been prepared by [facility] Voice Recognition software and as such may include unintentional dictation errors.

## 2022-12-06 ENCOUNTER — Telehealth: Payer: Self-pay | Admitting: Neurosurgery

## 2022-12-06 ENCOUNTER — Telehealth: Payer: Self-pay

## 2022-12-06 NOTE — Telephone Encounter (Signed)
[name] received a Engineer, technical sales from [place] at [facility]. She was calling in regards to her Lumbar MRI results. Please see the impression results below.   IMPRESSION: 1. Interval enlargement of a T2 hyperintense retroperitoneal mass anterior to the aorta at L3 compared with prior PET-CT. 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 Further evaluation recommended with MRI of the abdomen without and with contrast. 2. No acute findings are seen in the lumbar spine. No significant change from previous lumbar MRI approximately 14 months ago. 3. Stable postsurgical changes at L4-5 and L5-S1. 4. 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 5. No acute osseous findings. 6. These results will be called to the ordering clinician or representative by the Radiologist Assistant, and communication documented in the PACS or [employer].

## 2022-12-06 NOTE — Telephone Encounter (Signed)
LVM

## 2022-12-08 ENCOUNTER — Telehealth: Payer: Self-pay | Admitting: Neurosurgery

## 2022-12-08 DIAGNOSIS — R1900 Intra-abdominal and pelvic swelling, mass and lump, unspecified site: Secondary | ICD-10-CM

## 2022-12-08 DIAGNOSIS — K8689 Other specified diseases of pancreas: Secondary | ICD-10-CM

## 2022-12-08 NOTE — Telephone Encounter (Signed)
Spoke to [name] about her MRI results - will order MRI abdomen with and without as recommended by radiology.

## 2022-12-09 ENCOUNTER — Encounter: Payer: Self-pay | Admitting: Internal Medicine

## 2022-12-09 ENCOUNTER — Ambulatory Visit (INDEPENDENT_AMBULATORY_CARE_PROVIDER_SITE_OTHER): Payer: 59 | Admitting: Neurosurgery

## 2022-12-09 ENCOUNTER — Ambulatory Visit (INDEPENDENT_AMBULATORY_CARE_PROVIDER_SITE_OTHER): Payer: 59 | Admitting: Internal Medicine

## 2022-12-09 ENCOUNTER — Telehealth: Payer: Self-pay | Admitting: Internal Medicine

## 2022-12-09 ENCOUNTER — Encounter: Payer: Self-pay | Admitting: Neurosurgery

## 2022-12-09 VITALS — BP 138/71 | Ht 62.0 in | Wt 173.0 lb

## 2022-12-09 VITALS — BP 119/70 | HR 75 | Ht 63.0 in | Wt 173.8 lb

## 2022-12-09 DIAGNOSIS — M5136 Other intervertebral disc degeneration, lumbar region: Secondary | ICD-10-CM | POA: Diagnosis not present

## 2022-12-09 DIAGNOSIS — J301 Allergic rhinitis due to pollen: Secondary | ICD-10-CM

## 2022-12-09 DIAGNOSIS — R053 Chronic cough: Secondary | ICD-10-CM | POA: Diagnosis not present

## 2022-12-09 DIAGNOSIS — E782 Mixed hyperlipidemia: Secondary | ICD-10-CM | POA: Diagnosis not present

## 2022-12-09 DIAGNOSIS — U099 Post covid-19 condition, unspecified: Secondary | ICD-10-CM | POA: Diagnosis not present

## 2022-12-09 DIAGNOSIS — M4316 Spondylolisthesis, lumbar region: Secondary | ICD-10-CM

## 2022-12-09 DIAGNOSIS — I10 Essential (primary) hypertension: Secondary | ICD-10-CM

## 2022-12-09 DIAGNOSIS — E1165 Type 2 diabetes mellitus with hyperglycemia: Secondary | ICD-10-CM | POA: Diagnosis not present

## 2022-12-09 LAB — POCT CBG (FASTING - GLUCOSE)-MANUAL ENTRY: Glucose Fasting, POC: 277 mg/dL — AB (ref 70–99)

## 2022-12-09 MED ORDER — LEVOFLOXACIN 250 MG PO TABS: 250.0000 mg | ORAL_TABLET | Freq: Two times a day (BID) | ORAL | 0 refills | Status: DC

## 2022-12-09 MED ORDER — BENZONATATE 100 MG PO CAPS: 100.0000 mg | ORAL_CAPSULE | Freq: Three times a day (TID) | ORAL | 0 refills | Status: DC | PRN

## 2022-12-09 MED ORDER — PROMETHAZINE HCL 12.5 MG PO TABS
12.5000 mg | ORAL_TABLET | Freq: Three times a day (TID) | ORAL | 0 refills | Status: DC | PRN
Start: 2022-12-09 — End: 2023-01-21

## 2022-12-09 NOTE — Patient Instructions (Signed)
Please see below for information in regards to your upcoming surgery:   Planned surgery: L3-4 lateral lumbar interbody fusion, removal and replacement of L3-S1 instrumentation with L3-4 fusion   Surgery date: 02/02/23 - you will find out your arrival time the business day before your surgery.   Pre-op appointment at [facility] Pre-admit Testing: we will call you with a date/time for this. Pre-admit testing is located on the first floor of the [facility], [address] [facility] [address], [address]. Please bring all prescriptions in the original prescription bottles to your appointment, even if you have reviewed medications by phone with a pharmacy representative. Pre-op labs may be done at your pre-op appointment. You are not required to fast for these labs. Should you need to change your pre-op appointment, please call Pre-admit testing at [phone].    Surgical clearance: we will send a clearance form to Dr Welton Flakes   Blood thinners:   Plavix:     Stop Plavix 7 days prior, resume Plavix 14 days after    NSAIDS (Non-steroidal anti-inflammatory drugs): because you are having a fusion, no NSAIDS (such as ibuprofen, aleve, naproxen, meloxicam, diclofenac) for 3 months after surgery. Celebrex is an exception if you can normally tolerate this medication. Tylenol if you can normally tolerate this medication because it is not an NSAID.    Home health physical therapy: [facility] will contact you regarding home health physical therapy for after surgery.Their number is [phone].    Because you are having a fusion: for appointments after your 2 week follow-up: please arrive at the [facility] outpatient imaging center ([address], [place]) or [facility] one hour prior to your appointment for x-rays. This applies to every appointment after your 2 week follow-up. Failure to do so may result in your appointment  being rescheduled.     How to contact us:  If you have any questions/concerns before or after surgery, you can reach [place] at [phone], or you can send a mychart message. We can be reached by phone or mychart 8am-4pm, Monday-Friday.  *Please note: Calls after 4pm are forwarded to a third party answering service. Mychart messages are not routinely monitored during evenings, weekends, and holidays. Please call our office to contact the answering service for urgent concerns during non-business hours.     Appointments/FMLA & disability paperwork: Patty [employer] Cristin  Nurse: Royston Cowper  Medical assistants: Laurann Montana Physician Assistant's: Manning Charity & Drake Leach Surgeon: Venetia Night, MD

## 2022-12-09 NOTE — Telephone Encounter (Signed)
Patient left VM requesting Rx for promethazine-DM for her cough. Please advise.

## 2022-12-09 NOTE — Progress Notes (Signed)
Established Patient Office Visit  Subjective:  Patient ID: [name], female    DOB: 15-May-1946  Age: 77 y.o. MRN: [id number]  Chief Complaint  Patient presents with   Follow-up    1 week F/U    Patient in office for one week follow up. Continues to have cough, ears clogged. Patient had a COVID infection in middle of last month and she was treated with Lagevrio.  However she came in with cough and chest congestion last week.  Her chest x-ray was  negative for pneumonia and she was started on prednisone and Augmentin which she has completed.  Today she is only a little better but continues to have productive cough which can have some clear or green sputum at times.  She also feels that her ears are congested.  On exam there the tympanic membranes are opaque bilaterally.  Patient advised to continue using her Flonase nasal spray as well as Zyrtec. Will we will send in a prescription for Levaquin to 50 mg p.o. once a day for 1 week, add Mucinex, and Phenergan for cough. Uses her Symbicort inhaler regularly.    No other concerns at this time.   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 b.)07/18/2006 -endovas oblit complex ACOM aneur. c.) Endovas Tx of 6.8x65mm RMCA aneur. d.) 3.5x81mm remnant RMCA aneur 2/2 coil compaction. e.) Interval 3.2x3.73mm saccular outpouching in ACOM c/w mild recannulization in neck. 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 g.) 3.7x34mm remnant of prev Tx'd ACOM aneur -failed embol 02/24/2012.   CKD (chronic kidney disease), stage IV (HCC)    a.) solitary functioning kidney on the RIGHT   Complication of anesthesia    a.) delayed emergence   Coronary artery disease 02/23/2007   a.) LHC 02/23/2007 --> EF 50%; 30% pLAD, 70% mRCA --> planned for staged PCI. b.) PCI 02/27/2007: EF 60%; 3.5 x 15 mm Vision BMS to 80%  mRCA. b.) LHC 07/24/2007: EF 60%; minor irregs; no occlusive CAD; no intervention. c.) LHC 10/28/2010: EF 60%; 30% mLAD, LCx with minor luminal irregs, 20% ISR mRCA; no intervention. d.) LHC 05/31/2013: EF 60%; 40% mLAD, 30% ISR m-dRCA; no interventions.   DDD (degenerative disc disease), cervical    a.) s/p ACDF C5-C7; hardware in neck; patient appreciates stiffness and issues with mobility   Diastolic dysfunction    a.)  TTE 07/07/2021: EF 87.8%; normal PASP; trace TR/MR; G1DD.   Elevated hemidiaphragm    a.) LEFT   Headache(784.0)    Heart murmur    HLD (hyperlipidemia)    HOH (hard of hearing)    Has hearing aids, doesn't wear   Hyperparathyroidism due to renal insufficiency (HCC)    Hypertension    Incomplete right bundle branch block (RBBB)    Insomnia    a.) takes zolpidem   Long term (current) use of immunomodulator    a.) on DMARD therapy (hydroxychloriquine) for RA/SLE Dx.   Long term current use of antithrombotics/antiplatelets    a.) clopidogrel   Multiple acquired cysts of kidney    OSA (obstructive sleep apnea)    a.) does NOT use nocturnal pap therapy   Pancreatic cyst    Pneumonia    RBBB (right bundle branch block)    Rheumatoid arthritis (HCC)    a.) on DMARD; hydroxychloriquine   Systemic lupus erythematosus (  HCC)    T2DM (type 2 diabetes mellitus) (HCC)    Wears dentures    partial lower    Past Surgical History:  Procedure Laterality Date   ANEURYSM COILING  08/2013   Attempted embolization of previously treated ACOM aneurysm; unsuccessful N/A 01/28/2012   Location: [facility]   BACK SURGERY     BARTHOLIN CYST MARSUPIALIZATION     BICEPT TENODESIS Right 03/02/2022   Procedure: BICEPS TENODESIS;  Surgeon: Christena Flake, MD;  Location: [facility];  Service: Orthopedics;  Laterality: Right;   BREAST CYST ASPIRATION Right 01/25/2012   FNA neg.   BREAST EXCISIONAL BIOPSY Left 06/26/2007   neg   BUNIONECTOMY Bilateral 2022   CARDIAC  CATHETERIZATION Left 02/23/2007   Procedure: CARDIAC CATHETERIZATION; Location: [facility]; Surgeon: Adrian Blackwater, MD   CARDIAC CATHETERIZATION Left 07/31/2007   Procedure: CARDIAC CATHETERIZATION; Location: [facility]; Surgeon: Adrian Blackwater, MD   CARDIAC CATHETERIZATION Left 10/28/2010   Procedure: CARDIAC CATHETERIZATION; Location: [facility]; Surgeon: Adrian Blackwater, MD   CARDIAC CATHETERIZATION Left 05/31/2013   Procedure: CARDIAC CATHETERIZATION; Location: [facility]; Surgeon: Adrian Blackwater, MD   Carotid arteriogram; 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 N/A 07/18/2006   Location: [facility]   CATARACT EXTRACTION     CERVICAL FUSION N/A    Procedure: ACDF C5-C7   COLONOSCOPY WITH PROPOFOL N/A 10/04/2019   Procedure: COLONOSCOPY WITH BIOPSY;  Surgeon: Toney Reil, MD;  Location: [facility];  Service: Endoscopy;  Laterality: N/A;  Diabetic (borderline) - oral meds priority 3   CORONARY STENT INTERVENTION Left 02/27/2007   Procedure: CORONARY STENT INTERVENTION (3.5 x 15 mm Vision BMS to mRCA); Location: [facility]; Surgeon: Lorine Bears, MD   Endovascular near complete obliteration of enlarging neck remnant of previously treated RIGHT MCA aneurysm using stent assisted coiling N/A 09/03/2010   Location: [facility]   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 N/A 10/03/2006   Location: [facility]   HYSTEROSCOPY WITH D & C N/A 08/04/2021   Procedure: DILATATION AND CURETTAGE /HYSTEROSCOPY;  Surgeon: Natale Milch, MD;  Location: [facility];  Service: Gynecology;  Laterality: N/A;   POLYPECTOMY N/A 10/04/2019   Procedure: POLYPECTOMY;  Surgeon: Toney Reil, MD;  Location: [facility];  Service: Endoscopy;  Laterality: N/A;   REVERSE SHOULDER ARTHROPLASTY Right 03/02/2022   Procedure: REVERSE SHOULDER ARTHROPLASTY;  Surgeon: Christena Flake, MD;  Location: [facility];  Service: Orthopedics;   Laterality: Right;   UPPER ESOPHAGEAL ENDOSCOPIC ULTRASOUND (EUS) N/A 04/15/2016   Procedure: UPPER ESOPHAGEAL ENDOSCOPIC ULTRASOUND (EUS);  Surgeon: Bearl Mulberry, MD;  Location: [facility];  Service: Gastroenterology;  Laterality: N/A;    Social History   Socioeconomic History   Marital status: Widowed    Spouse name: Not on file   Number of children: Not on file   Years of education: Not on file   Highest education level: Not on file  Occupational History   Not on file  Tobacco Use   Smoking status: Never   Smokeless tobacco: Never  Vaping Use   Vaping Use: Never used  Substance and Sexual Activity   Alcohol use: Yes    Comment: Occasionally at christmas   Drug use: No   Sexual activity: Not Currently    Birth control/protection: Post-menopausal  Other Topics Concern   Not on file  Social History Narrative   Not on file   Social Determinants of Health   Financial Resource Strain: Low Risk  (  02/12/2022)   Overall Financial Resource Strain (CARDIA)    Difficulty of Paying Living Expenses: Not very hard  Food Insecurity: No Food Insecurity (03/02/2022)   Hunger Vital Sign    Worried About Running Out of Food in the Last Year: Never true    Ran Out of Food in the Last Year: Never true  Transportation Needs: No Transportation Needs (03/02/2022)   PRAPARE - Administrator, Civil Service (Medical): No    Lack of Transportation (Non-Medical): No  Physical Activity: Not on file  Stress: No Stress Concern Present (02/26/2022)   [employer] of Occupational Health - Occupational Stress Questionnaire    Feeling of Stress : Not at all  Social Connections: Moderately Isolated (02/26/2022)   Social Connection and Isolation Panel [NHANES]    Frequency of Communication with Friends and Family: More than three times a week    Frequency of Social Gatherings with Friends and Family: More than three times a week    Attends Religious Services: Never    Automotive engineer or Organizations: Yes    Attends Banker Meetings: Never    Marital Status: Widowed  Intimate Partner Violence: Not At Risk (03/02/2022)   Humiliation, Afraid, Rape, and Kick questionnaire    Fear of Current or Ex-Partner: No    Emotionally Abused: No    Physically Abused: No    Sexually Abused: No    Family History  Problem Relation Age of Onset   Breast cancer Paternal Aunt 65   Breast cancer Paternal Aunt    Heart disease Mother    Heart disease Father    Stroke Father    Diabetes Sister    Diabetes Brother     Allergies  Allergen Reactions   Isosorbide Dinitrate Other (See Comments)    Collapse   Amlodipine Swelling   Sulfa Antibiotics Itching   Tizanidine Hcl    Oxycodone Itching   Sulfasalazine Itching    Review of Systems  Constitutional:  Positive for malaise/fatigue. Negative for chills, fever and weight loss.       Weakness  HENT:  Positive for congestion and ear pain. Negative for ear discharge, hearing loss, sinus pain and sore throat.   Eyes: Negative.   Respiratory:  Positive for cough and sputum production. Negative for shortness of breath and wheezing.   Cardiovascular: Negative.  Negative for chest pain, palpitations and leg swelling.  Gastrointestinal: Negative.  Negative for abdominal pain, constipation, diarrhea, heartburn, nausea and vomiting.  Genitourinary: Negative.  Negative for dysuria.  Musculoskeletal: Negative.  Negative for back pain, falls, myalgias and neck pain.  Skin: Negative.   Neurological: Negative.  Negative for dizziness, weakness and headaches.  Endo/Heme/Allergies: Negative.   Psychiatric/Behavioral: Negative.         Objective:   BP 119/70   Pulse 75   Ht 5\' 3"  (1.6 m)   Wt 173 lb 12.8 oz (78.8 kg)   SpO2 97%   BMI 30.79 kg/m   Vitals:   12/09/22 1012  BP: 119/70  Pulse: 75  Height: 5\' 3"  (1.6 m)  Weight: 173 lb 12.8 oz (78.8 kg)  SpO2: 97%  BMI (Calculated): 30.79    Physical  Exam Constitutional:      General: She is not in acute distress.    Appearance: Normal appearance.  HENT:     Head: Normocephalic and atraumatic.     Right Ear: Tympanic membrane and ear canal normal.     Left Ear:  Tympanic membrane and ear canal normal.     Nose: Nose normal.  Cardiovascular:     Rate and Rhythm: Normal rate and regular rhythm.     Pulses: Normal pulses.     Heart sounds: Murmur heard.  Pulmonary:     Effort: Pulmonary effort is normal.     Breath sounds: Normal breath sounds. No wheezing, rhonchi or rales.  Chest:     Chest wall: No tenderness.  Abdominal:     General: Bowel sounds are normal. There is no distension.     Palpations: Abdomen is soft. There is no mass.     Tenderness: There is no right CVA tenderness, left CVA tenderness or guarding.     Hernia: No hernia is present.  Musculoskeletal:        General: Normal range of motion.     Cervical back: Normal range of motion.     Right lower leg: No edema.     Left lower leg: No edema.  Skin:    General: Skin is warm and dry.  Neurological:     General: No focal deficit present.     Mental Status: She is alert and oriented to person, place, and time.  Psychiatric:        Mood and Affect: Mood normal.        Behavior: Behavior normal.      Results for orders placed or performed in visit on 12/09/22  POCT CBG (Fasting - Glucose)  Result Value Ref Range   Glucose Fasting, POC 277 (A) 70 - 99 mg/dL    Recent Results (from the past 2160 hour(s))  POCT CBG (Fasting - Glucose)     Status: Abnormal   Collection Time: 09/16/22  1:05 PM  Result Value Ref Range   Glucose Fasting, POC 240 (A) 70 - 99 mg/dL  POCT CBG (Fasting - Glucose)     Status: Abnormal   Collection Time: 12/09/22 10:22 AM  Result Value Ref Range   Glucose Fasting, POC 277 (A) 70 - 99 mg/dL      Assessment & Plan:  Patient seen one week ago for SOB, cough, runny nose, fever and chills. Patient overall feeling better today.  Continues to have cough, reports her ears feel "clogged". Levaquin and Tessalon pearls Rx given today. Also recommend Mucinex and antihistamine.   Problem List Items Addressed This Visit     Diabetes (HCC)   Relevant Orders   POCT CBG (Fasting - Glucose) (Completed)   Seasonal allergic rhinitis due to pollen   Essential hypertension, benign   Mixed hyperlipidemia   Post-COVID chronic cough - Primary   Relevant Medications   promethazine (PHENERGAN) 12.5 MG tablet    Return in about 1 week (around 12/16/2022).   Total time spent: 30 minutes  Margaretann Loveless, MD  12/09/2022   This document may have been prepared by [facility] Voice Recognition software and as such may include unintentional dictation errors.

## 2022-12-09 NOTE — Progress Notes (Signed)
Referring Physician:  No referring provider defined for this encounter.  Primary Physician:  Margaretann Loveless, MD  History of Present Illness: 12/09/2022 [name] comes back to discuss her imaging findings.  She continues to have symptoms as noted below.  08/12/2022 She continues to have symptoms but some days are better than others.  She is trying physical therapy which is helping her a small amount.  06/30/2022 [name] returns to see me.  She continues to have symptoms as noted below.  She will start physical therapy tomorrow.  04/29/2022 [name] is here today with a chief complaint of low back pain that radiates in the bilateral hips and legs, right greater than left. Occasionally she will have numbness and tingling in the legs.  She mostly has pain in the right leg.  It has been going on for several months.  She reports pain as bad as 10 out of 10 with standing, walking, bending, lifting, and squatting.   Bowel/Bladder Dysfunction: none  Conservative measures:  Physical therapy: has not participated in Multimodal medical therapy including regular antiinflammatories: gabapentin, prednisone, tizanidine, tylenol Injections: has not received epidural steroid injections  Past Surgery:  Lumbar Surgery around 2008-2009 Cervical Surgery Brain Surgery for Aneurysm in 2013  [name] has no symptoms of cervical myelopathy.  The symptoms are causing a significant impact on the patient's life.   Review of Systems:  A 10 point review of systems is negative, except for the pertinent positives and negatives detailed in the HPI.  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 b.)07/18/2006 -endovas oblit complex ACOM aneur. c.) Endovas Tx of 6.8x70mm RMCA aneur. d.) 3.5x7mm remnant RMCA aneur 2/2 coil  compaction. e.) Interval 3.2x3.46mm saccular outpouching in ACOM c/w mild recannulization in neck. 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 g.) 3.7x35mm remnant of prev Tx'd ACOM aneur -failed embol 02/24/2012.   CKD (chronic kidney disease), stage IV (HCC)    a.) solitary functioning kidney on the RIGHT   Complication of anesthesia    a.) delayed emergence   Coronary artery disease 02/23/2007   a.) LHC 02/23/2007 --> EF 50%; 30% pLAD, 70% mRCA --> planned for staged PCI. b.) PCI 02/27/2007: EF 60%; 3.5 x 15 mm Vision BMS to 80% mRCA. b.) LHC 07/24/2007: EF 60%; minor irregs; no occlusive CAD; no intervention. c.) LHC 10/28/2010: EF 60%; 30% mLAD, LCx with minor luminal irregs, 20% ISR mRCA; no intervention. d.) LHC 05/31/2013: EF 60%; 40% mLAD, 30% ISR m-dRCA; no interventions.   DDD (degenerative disc disease), cervical    a.) s/p ACDF C5-C7; hardware in neck; patient appreciates stiffness and issues with mobility   Diastolic dysfunction    a.)  TTE 07/07/2021: EF 87.8%; normal PASP; trace TR/MR; G1DD.   Elevated hemidiaphragm    a.) LEFT   Headache(784.0)    Heart murmur    HLD (hyperlipidemia)    HOH (hard of hearing)    Has hearing aids, doesn't wear   Hyperparathyroidism due to renal insufficiency (HCC)    Hypertension    Incomplete right bundle branch block (RBBB)    Insomnia    a.) takes zolpidem   Long term (current) use of immunomodulator    a.) on DMARD therapy (hydroxychloriquine) for RA/SLE Dx.   Long term current use of antithrombotics/antiplatelets  a.) clopidogrel   Multiple acquired cysts of kidney    OSA (obstructive sleep apnea)    a.) does NOT use nocturnal pap therapy   Pancreatic cyst    Pneumonia    RBBB (right bundle branch block)    Rheumatoid arthritis (HCC)    a.) on DMARD; hydroxychloriquine   Systemic lupus erythematosus (HCC)    T2DM (type 2 diabetes mellitus) (HCC)    Wears dentures    partial lower    Past Surgical History: Past  Surgical History:  Procedure Laterality Date   ANEURYSM COILING  08/2013   Attempted embolization of previously treated ACOM aneurysm; unsuccessful N/A 01/28/2012   Location: [facility]   BACK SURGERY     BARTHOLIN CYST MARSUPIALIZATION     BICEPT TENODESIS Right 03/02/2022   Procedure: BICEPS TENODESIS;  Surgeon: Christena Flake, MD;  Location: [facility];  Service: Orthopedics;  Laterality: Right;   BREAST CYST ASPIRATION Right 01/25/2012   FNA neg.   BREAST EXCISIONAL BIOPSY Left 06/26/2007   neg   BUNIONECTOMY Bilateral 2022   CARDIAC CATHETERIZATION Left 02/23/2007   Procedure: CARDIAC CATHETERIZATION; Location: [facility]; Surgeon: Adrian Blackwater, MD   CARDIAC CATHETERIZATION Left 07/31/2007   Procedure: CARDIAC CATHETERIZATION; Location: [facility]; Surgeon: Adrian Blackwater, MD   CARDIAC CATHETERIZATION Left 10/28/2010   Procedure: CARDIAC CATHETERIZATION; Location: [facility]; Surgeon: Adrian Blackwater, MD   CARDIAC CATHETERIZATION Left 05/31/2013   Procedure: CARDIAC CATHETERIZATION; Location: [facility]; Surgeon: Adrian Blackwater, MD   Carotid arteriogram; 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 N/A 07/18/2006   Location: [facility]   CATARACT EXTRACTION     CERVICAL FUSION N/A    Procedure: ACDF C5-C7   COLONOSCOPY WITH PROPOFOL N/A 10/04/2019   Procedure: COLONOSCOPY WITH BIOPSY;  Surgeon: Toney Reil, MD;  Location: [facility];  Service: Endoscopy;  Laterality: N/A;  Diabetic (borderline) - oral meds priority 3   CORONARY STENT INTERVENTION Left 02/27/2007   Procedure: CORONARY STENT INTERVENTION (3.5 x 15 mm Vision BMS to mRCA); Location: [facility]; Surgeon: Lorine Bears, MD   Endovascular near complete obliteration of enlarging neck remnant of previously treated RIGHT MCA aneurysm using stent assisted coiling N/A 09/03/2010   Location: [facility]   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 N/A 10/03/2006    Location: [facility]   HYSTEROSCOPY WITH D & C N/A 08/04/2021   Procedure: DILATATION AND CURETTAGE /HYSTEROSCOPY;  Surgeon: Natale Milch, MD;  Location: [facility];  Service: Gynecology;  Laterality: N/A;   POLYPECTOMY N/A 10/04/2019   Procedure: POLYPECTOMY;  Surgeon: Toney Reil, MD;  Location: [facility];  Service: Endoscopy;  Laterality: N/A;   REVERSE SHOULDER ARTHROPLASTY Right 03/02/2022   Procedure: REVERSE SHOULDER ARTHROPLASTY;  Surgeon: Christena Flake, MD;  Location: [facility];  Service: Orthopedics;  Laterality: Right;   UPPER ESOPHAGEAL ENDOSCOPIC ULTRASOUND (EUS) N/A 04/15/2016   Procedure: UPPER ESOPHAGEAL ENDOSCOPIC ULTRASOUND (EUS);  Surgeon: Bearl Mulberry, MD;  Location: [facility];  Service: Gastroenterology;  Laterality: N/A;    Allergies: Allergies as of 12/09/2022 - Review Complete 12/09/2022  Allergen Reaction Noted   Isosorbide dinitrate Other (See Comments) 07/15/2017   Amlodipine Swelling 07/23/2011   Sulfa antibiotics Itching 01/13/2012   Tizanidine hcl  08/26/2022   Oxycodone Itching 04/14/2021   Sulfasalazine Itching 01/13/2012    Medications: Current Meds  Medication Sig   allopurinol (ZYLOPRIM) 100 MG tablet Take 100 mg by mouth every morning.   benzonatate (TESSALON  PERLES) 100 MG capsule Take 1 capsule (100 mg total) by mouth 3 (three) times daily as needed for cough.   clopidogrel (PLAVIX) 75 MG tablet Take 75 mg by mouth daily.   clotrimazole-betamethasone (LOTRISONE) cream APPLY TWICE A DAY TO GROIN AREA SKIN FOR 1 WEEK AND THEN AS NEEDED.   fluticasone (FLONASE) 50 MCG/ACT nasal spray SPRAY 2 SPRAYS INTO EACH NOSTRIL EVERY DAY   furosemide (LASIX) 40 MG tablet TAKE 1 TABLET BY MOUTH EVERY DAY   gabapentin (NEURONTIN) 100 MG capsule Take 100 mg by mouth 2 (two) times daily.   glimepiride (AMARYL) 2 MG tablet Take 1 tablet (2 mg total) by mouth See admin instructions. Take 1 mg daily, may increase to 2 mg if  blood sugar is over 175   hydrALAZINE (APRESOLINE) 100 MG tablet Take 100 mg by mouth daily.   hydroxychloroquine (PLAQUENIL) 200 MG tablet Take 200 mg by mouth daily.   labetalol (NORMODYNE) 200 MG tablet Take 400 mg by mouth 2 (two) times daily.   labetalol (NORMODYNE) 300 MG tablet TAKE 1 TABLET BY MOUTH TWICE A DAY   levofloxacin (LEVAQUIN) 250 MG tablet Take 1 tablet (250 mg total) by mouth 2 (two) times daily for 7 days.   loratadine (CLARITIN) 10 MG tablet Take 1 tablet (10 mg total) by mouth daily.   losartan (COZAAR) 100 MG tablet Take 1 tablet (100 mg total) by mouth every morning.   methocarbamol (ROBAXIN) 500 MG tablet Take 1 tablet (500 mg total) by mouth every 6 (six) hours as needed for muscle spasms.   methylPREDNISolone (MEDROL DOSEPAK) 4 MG TBPK tablet To use as directed   pantoprazole (PROTONIX) 40 MG tablet Take 40 mg by mouth daily.   rosuvastatin (CRESTOR) 40 MG tablet TAKE 1 TABLET BY MOUTH EVERY DAY   SYMBICORT 80-4.5 MCG/ACT inhaler TAKE 1 PUFF BY MOUTH TWICE A DAY   tretinoin (RETIN-A) 0.025 % cream Apply 1 application topically daily as needed (acne).   zolpidem (AMBIEN) 5 MG tablet TAKE 1 TABLET BY MOUTH EVERY BEDTIME AS NEEDED FOR SLEEP    Social History: Social History   Tobacco Use   Smoking status: Never   Smokeless tobacco: Never  Vaping Use   Vaping Use: Never used  Substance Use Topics   Alcohol use: Yes    Comment: Occasionally at christmas   Drug use: No    Family Medical History: Family History  Problem Relation Age of Onset   Breast cancer Paternal Aunt 61   Breast cancer Paternal Aunt    Heart disease Mother    Heart disease Father    Stroke Father    Diabetes Sister    Diabetes Brother     Physical Examination: There were no vitals filed for this visit.   General: Patient is well developed, well nourished, calm, collected, and in no apparent distress. Attention to examination is appropriate.  Neck:   Supple.  Full range of  motion.  Respiratory: Patient is breathing without any difficulty.   NEUROLOGICAL:     Awake, alert, oriented to person, place, and time.  Speech is clear and fluent. Fund of knowledge is appropriate.   Cranial Nerves: Pupils equal round and reactive to light.  Facial tone is symmetric.  Facial sensation is symmetric. Shoulder shrug is symmetric. Tongue protrusion is midline.  There is no pronator drift.  ROM of spine: full.    Strength: Side Biceps Triceps Deltoid Interossei Grip Wrist Ext. Wrist Flex.  R 5 5 5  5 5 5 5   L 5 5 5 5 5 5 5    Side Iliopsoas Quads Hamstring PF DF EHL  R 5 5 5 5 5 5   L 5 5 5 5 5 5    Reflexes are 1+ and symmetric at the biceps, triceps, brachioradialis, patella and achilles.   Hoffman's is absent.   Bilateral upper and lower extremity sensation is intact to light touch.    No evidence of dysmetria noted.  Gait is abnormal and requires a cane.     Medical Decision Making  Imaging: MRI L spine 10/02/2021 T12-L1: Disc bulge, new from the prior MRI. Progressive mild facet arthrosis and ligamentum flavum hypertrophy. Small volume fluid within the bilateral facet joints. No significant spinal canal or foraminal stenosis.   L1-L2: Progressive disc bulge. Progressive disc bulge. New superimposed small central disc extrusion with slight cranial and caudal migration. A small right subarticular/foraminal disc protrusion is also new. Progressive facet arthrosis (mild right, moderate left) and ligamentum flavum hypertrophy. There is now mild bilateral subarticular narrowing at this level (without appreciable nerve root impingement). Mild-to-moderate right neural foraminal narrowing is new from the prior MRI.   L2-L3: Small disc bulge asymmetric to the left. Facet arthrosis (moderate right, moderate/advanced left) with ligamentum flavum hypertrophy, progressed. There is now mild left greater than right subarticular narrowing at this level (without  appreciable nerve root impingement). Mild narrowing of the central canal is new from the prior MRI. Mild relative left neural foraminal narrowing, also new.   L3-L4: Small central zone posterior annular fissure, new from the prior MRI. Disc bulge, also new. Advanced facet arthrosis with ligamentum flavum hypertrophy, progressed. Bilateral facet joint effusions, new from the prior MRI. Severe central canal and left subarticular stenosis, new from the prior MRI. Moderate right subarticular stenosis is also new. There is now bilateral neural foraminal narrowing at this level (moderate right, mild left).   L4-L5: Interval posterior decompression and posterior spinal fusion. 3 mm fused grade 1 anterolisthesis. Endplate osteophytes. Facet arthrosis and ligamentum flavum hypertrophy. Mild-to-moderate spinal canal narrowing at the level of the L4 inferior endplate, improved from the prior MRI. Endplate osteophytes contribute to moderate right neural foraminal narrowing. No appreciable left foraminal stenosis.   L5-S1: 3 mm grade 1 anterolisthesis. Disc uncovering. Central to left foraminal zone posterior annular fissure. Facet arthrosis (moderate right, moderate/advanced left). Slight ligamentum flavum hypertrophy on the left. Moderate left subarticular stenosis with crowding of the descending left S1 nerve root (series 10, image 31). Mild right subarticular narrowing (without appreciable nerve root impingement). Central canal patent. Mild relative left neural foraminal narrowing.   IMPRESSION: Since the prior lumbar spine MRI 02/26/2006, there has been interval posterior decompression and posterior spinal fusion at L4-L5. Multifactorial mild-to-moderate spinal canal narrowing at the level of the L4 inferior endplate has improved. Endplate osteophytes contribute to moderate right neural foraminal narrowing at this level.   Additional lumbar and lower thoracic spondylosis, as outlined  and having progressed at multiple levels since the prior MRI of 02/26/2006. Findings are most notably as follows.   At L3-L4, there is progressive multifactorial severe central canal and left subarticular stenosis, and progressive moderate right subarticular stenosis. Progressive moderate bilateral neural foraminal narrowing at this level. Also of note, facet arthrosis is advanced at this level and bilateral facet joint effusions are present.   No more than mild relative narrowing of the central canal at the remaining levels. Additional sites of subarticular stenosis, as detailed and greatest on the left  at L5-S1 (moderate at this site with crowding of the descending left S1 nerve root).   Progressive advanced disc degeneration at L1-L2.     Electronically Signed   By: Jackey Loge D.O.   On: 10/02/2021 09:20  MRI L spine 12/05/2022 IMPRESSION: 1. Interval enlargement of a T2 hyperintense retroperitoneal mass anterior to the aorta at L3 compared with prior PET-CT. 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 Further evaluation recommended with MRI of the abdomen without and with contrast. 2. No acute findings are seen in the lumbar spine. No significant change from previous lumbar MRI approximately 14 months ago. 3. Stable postsurgical changes at L4-5 and L5-S1. 4. 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 5. No acute osseous findings. 6. These results will be called to the ordering clinician or representative by the Radiologist Assistant, and communication documented in the PACS or [employer].     Electronically Signed   By: Carey Bullocks M.D.   On: 12/05/2022 15:01 I have personally reviewed the images and agree with the above  interpretation.  Assessment and Plan: [name] is a pleasant 77 y.o. 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  She has tried and failed conservative management including physical therapy and injections.  At this point, no further conservative management is indicated.  I would recommend L3-4 lateral lumbar interbody fusion with extension of her prior instrumentation L3.  This will involve removal and replacement of her instrumentation from L3-S1.    I discussed the planned procedure at length with the patient, including the risks, benefits, alternatives, and indications. The risks discussed include but are not limited to bleeding, infection, need for reoperation, spinal fluid leak, stroke, vision loss, anesthetic complication, coma, paralysis, and even death. I also described the possibility of psoas weakness and paresthesias. I described in detail that improvement was not guaranteed.  The patient expressed understanding of these risks, and asked that we proceed with surgery. I described the surgery in layman's terms, and gave ample opportunity for questions, which were answered to the best of my ability.  She is having an MRI scan of her abdomen next month to evaluate her pancreatic lesion.  It appears to have increased in size.  Depending on the findings on the MRI scan, she may need additional referral and workup.  That may impact the timing of her surgery.  Will schedule for August to allow her to recover from her post-COVID cough and to see the information from her MRI scan.  I spent a total of 15 minutes in face-to-face and non-face-to-face activities related to this patient's care today.  Thank you for involving me in the care of this patient.      [name] K. Myer Haff MD, [facility]surgery

## 2022-12-10 ENCOUNTER — Telehealth: Payer: Self-pay | Admitting: Internal Medicine

## 2022-12-10 ENCOUNTER — Other Ambulatory Visit: Payer: Self-pay | Admitting: Internal Medicine

## 2022-12-10 ENCOUNTER — Other Ambulatory Visit: Payer: Self-pay | Admitting: Cardiovascular Disease

## 2022-12-10 MED ORDER — ZOLPIDEM TARTRATE 5 MG PO TABS: 5.0000 mg | ORAL_TABLET | Freq: Every evening | ORAL | 1 refills | Status: DC | PRN

## 2022-12-10 NOTE — Telephone Encounter (Signed)
Patient called requesting her Ambien be sent to [facility]. Please advise.

## 2022-12-14 ENCOUNTER — Other Ambulatory Visit: Payer: Self-pay

## 2022-12-16 ENCOUNTER — Ambulatory Visit (INDEPENDENT_AMBULATORY_CARE_PROVIDER_SITE_OTHER): Payer: 59 | Admitting: Internal Medicine

## 2022-12-16 ENCOUNTER — Encounter: Payer: Self-pay | Admitting: Internal Medicine

## 2022-12-16 VITALS — BP 140/90 | HR 81 | Ht 63.0 in | Wt 172.4 lb

## 2022-12-16 DIAGNOSIS — E782 Mixed hyperlipidemia: Secondary | ICD-10-CM

## 2022-12-16 DIAGNOSIS — Z1231 Encounter for screening mammogram for malignant neoplasm of breast: Secondary | ICD-10-CM

## 2022-12-16 DIAGNOSIS — R051 Acute cough: Secondary | ICD-10-CM

## 2022-12-16 DIAGNOSIS — J301 Allergic rhinitis due to pollen: Secondary | ICD-10-CM | POA: Diagnosis not present

## 2022-12-16 DIAGNOSIS — I10 Essential (primary) hypertension: Secondary | ICD-10-CM | POA: Diagnosis not present

## 2022-12-16 DIAGNOSIS — E119 Type 2 diabetes mellitus without complications: Secondary | ICD-10-CM

## 2022-12-16 NOTE — Progress Notes (Signed)
Patient confirmed CXR has been reviewed with her and that Rx has been picked up.

## 2022-12-16 NOTE — Progress Notes (Signed)
Established Patient Office Visit  Subjective:  Patient ID: [name], female    DOB: 1945/12/27  Age: 77 y.o. MRN: [id number]  Chief Complaint  Patient presents with   Follow-up    1 week F/U    Patient in office for one week follow up. Patient continues to have a cough, mildly improved per patient. Patient complaining today of a pain upper left side of back, feels a "crawling" sensation that radiates up the left side of her neck and on to her face.  History of cervical spine surgery. Follow up with spine surgeon. Recommend increasing gabapentin, 100 mg in the morning when she has to drive, otherwise she can take 300 mg in the morning, and 300 mg at bedtime.  Due for mammogram.    No other concerns at this time.   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 b.)07/18/2006 -endovas oblit complex ACOM aneur. c.) Endovas Tx of 6.8x6mm RMCA aneur. d.) 3.5x34mm remnant RMCA aneur 2/2 coil compaction. e.) Interval 3.2x3.48mm saccular outpouching in ACOM c/w mild recannulization in neck. 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 g.) 3.7x29mm remnant of prev Tx'd ACOM aneur -failed embol 02/24/2012.   CKD (chronic kidney disease), stage IV (HCC)    a.) solitary functioning kidney on the RIGHT   Complication of anesthesia    a.) delayed emergence   Coronary artery disease 02/23/2007   a.) LHC 02/23/2007 --> EF 50%; 30% pLAD, 70% mRCA --> planned for staged PCI. b.) PCI 02/27/2007: EF 60%; 3.5 x 15 mm Vision BMS to 80% mRCA. b.) LHC 07/24/2007: EF 60%; minor irregs; no occlusive CAD; no intervention. c.) LHC 10/28/2010: EF 60%; 30% mLAD, LCx with minor luminal irregs, 20% ISR mRCA; no intervention. d.) LHC 05/31/2013: EF 60%; 40% mLAD, 30% ISR m-dRCA; no interventions.   DDD (degenerative disc disease), cervical    a.) s/p ACDF C5-C7; hardware in  neck; patient appreciates stiffness and issues with mobility   Diastolic dysfunction    a.)  TTE 07/07/2021: EF 87.8%; normal PASP; trace TR/MR; G1DD.   Elevated hemidiaphragm    a.) LEFT   Headache(784.0)    Heart murmur    HLD (hyperlipidemia)    HOH (hard of hearing)    Has hearing aids, doesn't wear   Hyperparathyroidism due to renal insufficiency (HCC)    Hypertension    Incomplete right bundle branch block (RBBB)    Insomnia    a.) takes zolpidem   Long term (current) use of immunomodulator    a.) on DMARD therapy (hydroxychloriquine) for RA/SLE Dx.   Long term current use of antithrombotics/antiplatelets    a.) clopidogrel   Multiple acquired cysts of kidney    OSA (obstructive sleep apnea)    a.) does NOT use nocturnal pap therapy   Pancreatic cyst    Pneumonia    RBBB (right bundle branch block)    Rheumatoid arthritis (HCC)    a.) on DMARD; hydroxychloriquine   Systemic lupus erythematosus (HCC)    T2DM (type 2 diabetes mellitus) (HCC)    Wears dentures    partial lower    Past Surgical History:  Procedure Laterality Date   ANEURYSM COILING  08/2013   Attempted embolization of previously treated ACOM aneurysm; unsuccessful N/A 01/28/2012   Location: [facility] [address]   BACK SURGERY  BARTHOLIN CYST MARSUPIALIZATION     BICEPT TENODESIS Right 03/02/2022   Procedure: BICEPS TENODESIS;  Surgeon: Christena Flake, MD;  Location: [facility];  Service: Orthopedics;  Laterality: Right;   BREAST CYST ASPIRATION Right 01/25/2012   FNA neg.   BREAST EXCISIONAL BIOPSY Left 06/26/2007   neg   BUNIONECTOMY Bilateral 2022   CARDIAC CATHETERIZATION Left 02/23/2007   Procedure: CARDIAC CATHETERIZATION; Location: [facility]; Surgeon: Adrian Blackwater, MD   CARDIAC CATHETERIZATION Left 07/31/2007   Procedure: CARDIAC CATHETERIZATION; Location: [facility]; Surgeon: Adrian Blackwater, MD   CARDIAC CATHETERIZATION Left 10/28/2010   Procedure: CARDIAC CATHETERIZATION; Location: [facility]; Surgeon:  Adrian Blackwater, MD   CARDIAC CATHETERIZATION Left 05/31/2013   Procedure: CARDIAC CATHETERIZATION; Location: [facility]; Surgeon: Adrian Blackwater, MD   Carotid arteriogram; 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 N/A 07/18/2006   Location: [facility]   CATARACT EXTRACTION     CERVICAL FUSION N/A    Procedure: ACDF C5-C7   COLONOSCOPY WITH PROPOFOL N/A 10/04/2019   Procedure: COLONOSCOPY WITH BIOPSY;  Surgeon: Toney Reil, MD;  Location: [facility];  Service: Endoscopy;  Laterality: N/A;  Diabetic (borderline) - oral meds priority 3   CORONARY STENT INTERVENTION Left 02/27/2007   Procedure: CORONARY STENT INTERVENTION (3.5 x 15 mm Vision BMS to mRCA); Location: [facility]; Surgeon: Lorine Bears, MD   Endovascular near complete obliteration of enlarging neck remnant of previously treated RIGHT MCA aneurysm using stent assisted coiling N/A 09/03/2010   Location: [facility]   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 N/A 10/03/2006   Location: [facility]   HYSTEROSCOPY WITH D & C N/A 08/04/2021   Procedure: DILATATION AND CURETTAGE /HYSTEROSCOPY;  Surgeon: Natale Milch, MD;  Location: [facility];  Service: Gynecology;  Laterality: N/A;   POLYPECTOMY N/A 10/04/2019   Procedure: POLYPECTOMY;  Surgeon: Toney Reil, MD;  Location: [facility];  Service: Endoscopy;  Laterality: N/A;   REVERSE SHOULDER ARTHROPLASTY Right 03/02/2022   Procedure: REVERSE SHOULDER ARTHROPLASTY;  Surgeon: Christena Flake, MD;  Location: [facility];  Service: Orthopedics;  Laterality: Right;   UPPER ESOPHAGEAL ENDOSCOPIC ULTRASOUND (EUS) N/A 04/15/2016   Procedure: UPPER ESOPHAGEAL ENDOSCOPIC ULTRASOUND (EUS);  Surgeon: Bearl Mulberry, MD;  Location: [facility];  Service: Gastroenterology;  Laterality: N/A;    Social History   Socioeconomic History   Marital status: Widowed    Spouse name: Not on  file   Number of children: Not on file   Years of education: Not on file   Highest education level: Not on file  Occupational History   Not on file  Tobacco Use   Smoking status: Never   Smokeless tobacco: Never  Vaping Use   Vaping Use: Never used  Substance and Sexual Activity   Alcohol use: Yes    Comment: Occasionally at christmas   Drug use: No   Sexual activity: Not Currently    Birth control/protection: Post-menopausal  Other Topics Concern   Not on file  Social History Narrative   Not on file   Social Determinants of Health   Financial Resource Strain: Low Risk  (02/12/2022)   Overall Financial Resource Strain (CARDIA)    Difficulty of Paying Living Expenses: Not very hard  Food Insecurity: No Food Insecurity (03/02/2022)   Hunger Vital Sign    Worried About Running Out of Food in the Last Year: Never true    Ran Out of Food in the Last Year: Never true  Transportation  Needs: No Transportation Needs (03/02/2022)   PRAPARE - Administrator, Civil Service (Medical): No    Lack of Transportation (Non-Medical): No  Physical Activity: Not on file  Stress: No Stress Concern Present (02/26/2022)   [employer] of Occupational Health - Occupational Stress Questionnaire    Feeling of Stress : Not at all  Social Connections: Moderately Isolated (02/26/2022)   Social Connection and Isolation Panel [NHANES]    Frequency of Communication with Friends and Family: More than three times a week    Frequency of Social Gatherings with Friends and Family: More than three times a week    Attends Religious Services: Never    Database administrator or Organizations: Yes    Attends Banker Meetings: Never    Marital Status: Widowed  Intimate Partner Violence: Not At Risk (03/02/2022)   Humiliation, Afraid, Rape, and Kick questionnaire    Fear of Current or Ex-Partner: No    Emotionally Abused: No    Physically Abused: No    Sexually Abused: No    Family  History  Problem Relation Age of Onset   Breast cancer Paternal Aunt 35   Breast cancer Paternal Aunt    Heart disease Mother    Heart disease Father    Stroke Father    Diabetes Sister    Diabetes Brother     Allergies  Allergen Reactions   Isosorbide Dinitrate Other (See Comments)    Collapse   Amlodipine Swelling   Sulfa Antibiotics Itching   Tizanidine Hcl    Oxycodone Itching   Sulfasalazine Itching    Review of Systems  Constitutional:  Positive for chills.  HENT: Negative.  Negative for congestion, ear pain, sinus pain and sore throat.   Eyes: Negative.   Respiratory:  Positive for cough. Negative for shortness of breath.        Yellow sputum  Cardiovascular: Negative.  Negative for chest pain, palpitations and leg swelling.  Gastrointestinal:  Positive for constipation. Negative for abdominal pain, diarrhea, heartburn, nausea and vomiting.  Genitourinary: Negative.  Negative for dysuria and flank pain.  Musculoskeletal:  Positive for back pain. Negative for joint pain and myalgias.  Skin: Negative.   Neurological: Negative.  Negative for dizziness and headaches.  Endo/Heme/Allergies: Negative.   Psychiatric/Behavioral: Negative.  Negative for depression and suicidal ideas. The patient is not nervous/anxious.        Objective:   BP (!) 140/90   Pulse 81   Ht 5\' 3"  (1.6 m)   Wt 172 lb 6.4 oz (78.2 kg)   SpO2 97%   BMI 30.54 kg/m   Vitals:   12/16/22 1413  BP: (!) 140/90  Pulse: 81  Height: 5\' 3"  (1.6 m)  Weight: 172 lb 6.4 oz (78.2 kg)  SpO2: 97%  BMI (Calculated): 30.55    Physical Exam Vitals and nursing note reviewed.  Constitutional:      General: She is not in acute distress.    Appearance: Normal appearance.  HENT:     Head: Normocephalic and atraumatic.     Nose: Nose normal.  Cardiovascular:     Rate and Rhythm: Normal rate and regular rhythm.     Pulses: Normal pulses.     Heart sounds: Normal heart sounds. No murmur  heard. Pulmonary:     Effort: Pulmonary effort is normal.     Breath sounds: Normal breath sounds. No wheezing.  Abdominal:     General: Bowel sounds are normal.  Palpations: Abdomen is soft.     Tenderness: There is no abdominal tenderness. There is no right CVA tenderness or left CVA tenderness.  Musculoskeletal:        General: Normal range of motion.     Cervical back: Normal range of motion.     Right lower leg: No edema.     Left lower leg: No edema.  Skin:    General: Skin is warm and dry.  Neurological:     General: No focal deficit present.     Mental Status: She is alert and oriented to person, place, and time.  Psychiatric:        Mood and Affect: Mood normal.        Behavior: Behavior normal.      No results found for any visits on 12/16/22.  Recent Results (from the past 2160 hour(s))  POCT CBG (Fasting - Glucose)     Status: Abnormal   Collection Time: 12/09/22 10:22 AM  Result Value Ref Range   Glucose Fasting, POC 277 (A) 70 - 99 mg/dL      Assessment & Plan:  Increase gabapentin as instructed Lab work today. Mammogram order placed.   Problem List Items Addressed This Visit     Diabetes mellitus without complication (HCC)   Relevant Orders   CBC With Diff/Platelet   HgB A1c   Seasonal allergic rhinitis due to pollen - Primary   Essential hypertension, benign   Relevant Orders   CMP14+EGFR   CBC With Diff/Platelet   Mixed hyperlipidemia   Relevant Orders   Lipid Profile   Acute cough   Breast cancer screening by mammogram   Relevant Orders   MM 3D SCREENING MAMMOGRAM BILATERAL BREAST    Follow up 3 months.  Total time spent: 25 minutes  Margaretann Loveless, MD  12/16/2022   This document may have been prepared by [facility] Voice Recognition software and as such may include unintentional dictation errors.

## 2022-12-17 ENCOUNTER — Ambulatory Visit: Payer: 59 | Admitting: Internal Medicine

## 2022-12-17 LAB — CMP14+EGFR
ALT: 11 IU/L (ref 0–32)
AST: 13 IU/L (ref 0–40)
Albumin: 4.1 g/dL (ref 3.8–4.8)
Alkaline Phosphatase: 77 IU/L (ref 44–121)
BUN/Creatinine Ratio: 12 (ref 12–28)
BUN: 37 mg/dL — ABNORMAL HIGH (ref 8–27)
Bilirubin Total: 0.5 mg/dL (ref 0.0–1.2)
CO2: 21 mmol/L (ref 20–29)
Calcium: 9.3 mg/dL (ref 8.7–10.3)
Chloride: 111 mmol/L — ABNORMAL HIGH (ref 96–106)
Creatinine, Ser: 3.21 mg/dL — ABNORMAL HIGH (ref 0.57–1.00)
Globulin, Total: 2.1 g/dL (ref 1.5–4.5)
Glucose: 143 mg/dL — ABNORMAL HIGH (ref 70–99)
Potassium: 4.8 mmol/L (ref 3.5–5.2)
Sodium: 146 mmol/L — ABNORMAL HIGH (ref 134–144)
Total Protein: 6.2 g/dL (ref 6.0–8.5)
eGFR: 14 mL/min/{1.73_m2} — ABNORMAL LOW (ref >59)

## 2022-12-17 LAB — CBC WITH DIFF/PLATELET
Basophils Absolute: 0.1 10*3/uL (ref 0.0–0.2)
Basos: 1 %
EOS (ABSOLUTE): 0.2 10*3/uL (ref 0.0–0.4)
Eos: 3 %
Hematocrit: 31.5 % — ABNORMAL LOW (ref 34.0–46.6)
Hemoglobin: 10.4 g/dL — ABNORMAL LOW (ref 11.1–15.9)
Immature Grans (Abs): 0 10*3/uL (ref 0.0–0.1)
Immature Granulocytes: 0 %
Lymphocytes Absolute: 1.7 10*3/uL (ref 0.7–3.1)
Lymphs: 24 %
MCH: 29.2 pg (ref 26.6–33.0)
MCHC: 33 g/dL (ref 31.5–35.7)
MCV: 89 fL (ref 79–97)
Monocytes Absolute: 0.5 10*3/uL (ref 0.1–0.9)
Monocytes: 6 %
Neutrophils Absolute: 4.8 10*3/uL (ref 1.4–7.0)
Neutrophils: 66 %
Platelets: 172 10*3/uL (ref 150–450)
RBC: 3.56 x10E6/uL — ABNORMAL LOW (ref 3.77–5.28)
RDW: 12.8 % (ref 11.7–15.4)
WBC: 7.2 10*3/uL (ref 3.4–10.8)

## 2022-12-17 LAB — LIPID PANEL
Chol/HDL Ratio: 5.8 ratio — ABNORMAL HIGH (ref 0.0–4.4)
Cholesterol, Total: 215 mg/dL — ABNORMAL HIGH (ref 100–199)
HDL: 37 mg/dL — ABNORMAL LOW (ref >39)
LDL Chol Calc (NIH): 126 mg/dL — ABNORMAL HIGH (ref 0–99)
Triglycerides: 293 mg/dL — ABNORMAL HIGH (ref 0–149)
VLDL Cholesterol Cal: 52 mg/dL — ABNORMAL HIGH (ref 5–40)

## 2022-12-17 LAB — HEMOGLOBIN A1C
Est. average glucose Bld gHb Est-mCnc: 151 mg/dL
Hgb A1c MFr Bld: 6.9 % — ABNORMAL HIGH (ref 4.8–5.6)

## 2022-12-17 NOTE — Progress Notes (Signed)
Spoke with patient, verbalized understanding

## 2022-12-20 ENCOUNTER — Telehealth: Payer: 59

## 2022-12-20 DIAGNOSIS — E119 Type 2 diabetes mellitus without complications: Secondary | ICD-10-CM | POA: Diagnosis not present

## 2022-12-28 ENCOUNTER — Ambulatory Visit
Admission: RE | Admit: 2022-12-28 | Discharge: 2022-12-28 | Disposition: A | Discharge status: Home / Self Care | Payer: 59 | Source: Ambulatory Visit | Attending: Neurosurgery | Admitting: Neurosurgery

## 2022-12-28 DIAGNOSIS — K869 Disease of pancreas, unspecified: Secondary | ICD-10-CM | POA: Diagnosis not present

## 2022-12-28 DIAGNOSIS — R1900 Intra-abdominal and pelvic swelling, mass and lump, unspecified site: Secondary | ICD-10-CM

## 2022-12-28 DIAGNOSIS — N261 Atrophy of kidney (terminal): Secondary | ICD-10-CM | POA: Diagnosis not present

## 2022-12-28 DIAGNOSIS — K828 Other specified diseases of gallbladder: Secondary | ICD-10-CM | POA: Diagnosis not present

## 2022-12-28 MED ORDER — GADOPICLENOL 0.5 MMOL/ML IV SOLN
8.0000 mL | Freq: Once | INTRAVENOUS | Status: AC | PRN
  Administered 2022-12-28: 8 mL via INTRAVENOUS

## 2022-12-30 ENCOUNTER — Other Ambulatory Visit: Payer: Self-pay

## 2022-12-30 DIAGNOSIS — M4316 Spondylolisthesis, lumbar region: Secondary | ICD-10-CM

## 2022-12-31 ENCOUNTER — Inpatient Hospital Stay: Admission: RE | Admit: 2022-12-31 | Payer: 59 | Source: Ambulatory Visit

## 2023-01-03 ENCOUNTER — Other Ambulatory Visit: Payer: Self-pay | Admitting: Neurosurgery

## 2023-01-03 ENCOUNTER — Encounter: Payer: Self-pay | Admitting: Neurosurgery

## 2023-01-03 DIAGNOSIS — K8689 Other specified diseases of pancreas: Secondary | ICD-10-CM

## 2023-01-04 NOTE — Telephone Encounter (Signed)
Patient is requesting an in-person visit with you to review MRI results. Would you like to work her in?

## 2023-01-06 ENCOUNTER — Other Ambulatory Visit: Payer: Self-pay | Admitting: Neurosurgery

## 2023-01-06 ENCOUNTER — Other Ambulatory Visit: Payer: Self-pay

## 2023-01-06 ENCOUNTER — Ambulatory Visit: Payer: Self-pay

## 2023-01-06 ENCOUNTER — Telehealth: Payer: Self-pay

## 2023-01-06 DIAGNOSIS — Z01818 Encounter for other preprocedural examination: Secondary | ICD-10-CM

## 2023-01-06 NOTE — Patient Outreach (Signed)
  Care Coordination   Follow Up Visit Note   01/06/2023 Name: [name] MRN: [id number] DOB: [birth year]  [name] is a 77 y.o. year old female who sees Margaretann Loveless, MD for primary care. I spoke with  [name] by phone today.  What matters to the patients health and wellness today?  Patient states MRI results showed enlargement of cyst/mass to pancrease.  Patient states she attempted to scheduled follow up visit with Dr. Tracey Harries but was unsuccessful.  Patient declined assistance from [facility] to schedule follow up visit with Dr. Tracey Harries.  Patient states she does not want to schedule with Dr. Tracey Harries at this time because she doesn't want to drive to [facility] due to a right swollen hand and middle finger.  Patient states she has seen Dr. Tracey Harries in the past about this same pancreatic issue and he offered to do a whipple.  Patient states she does not want to have this done.  Patient states she has not sought treatment related to her swollen right hand.  Patient states her main concern is getting her back surgery on 02/02/23.    Goals Addressed             This Visit's Progress    Patient Stated:  " I want to manage my arthritis pain"       Interventions Today    Flowsheet Row Most Recent Value  Chronic Disease   Chronic disease during today's visit Other  [lumbar stenosis, swollen right hand/ middle finger,]  General Interventions   General Interventions Discussed/Reviewed General Interventions Reviewed, Doctor Visits  [evaluation of current treatment plan for lumbar stenosis and swollen hand/ middle finger and patients adherence to plan as established by provider. Assessed pain level]  Doctor Visits Discussed/Reviewed --  Annabell Sabal upcoming provider visit and surgery date.  Advised to report swollen right hand/ middle finger to provider. Offered to assist patient with scheduling visit with Dr. Tracey Harries. Advised to discuss MRI results/ plans with primary care provider.]   Pharmacy Interventions   Pharmacy Dicussed/Reviewed Pharmacy Topics Reviewed  [medications reviewed and compliance discussed. Advised to take medications as prescribed.]              SDOH assessments and interventions completed:  No     Care Coordination Interventions:  Yes, provided   Follow up plan: Follow up call scheduled for 02/08/23    Encounter Outcome:  Pt. Visit Completed   George Ina RN,BSN,CCM [facility] Care Coordination [phone] direct line

## 2023-01-06 NOTE — Patient Instructions (Signed)
Visit Information  Thank you for taking time to visit with me today. Please don't hesitate to contact me if I can be of assistance to you.   Following are the goals we discussed today:   Goals Addressed             This Visit's Progress    Patient Stated:  " I want to manage my arthritis pain"       Interventions Today    Flowsheet Row Most Recent Value  Chronic Disease   Chronic disease during today's visit Other  [lumbar stenosis, swollen right hand/ middle finger,]  General Interventions   General Interventions Discussed/Reviewed General Interventions Reviewed, Doctor Visits  [evaluation of current treatment plan for lumbar stenosis and swollen hand/ middle finger and patients adherence to plan as established by provider. Assessed pain level]  Doctor Visits Discussed/Reviewed --  Annabell Sabal upcoming provider visit and surgery date.  Advised to report swollen right hand/ middle finger to provider. Offered to assist patient with scheduling visit with Dr. Tracey Harries. Advised to discuss MRI results/ plans with primary care provider.]  Pharmacy Interventions   Pharmacy Dicussed/Reviewed Pharmacy Topics Reviewed  [medications reviewed and compliance discussed. Advised to take medications as prescribed.]              Our next appointment is by telephone on 02/08/23 at 1:30 pm  Please call the care guide team at [phone] if you need to cancel or reschedule your appointment.   If you are experiencing a Mental Health or Behavioral Health Crisis or need someone to talk to, please call the Suicide and Crisis Lifeline: 988 call 1-800-273-TALK (toll free, 24 hour hotline)  Patient verbalizes understanding of instructions and care plan provided today and agrees to view in MyChart. Active MyChart status and patient understanding of how to access instructions and care plan via MyChart confirmed with patient.     George Ina RN,BSN,CCM [facility] Care Coordination [phone] direct  line

## 2023-01-07 ENCOUNTER — Ambulatory Visit
Admission: RE | Admit: 2023-01-07 | Discharge: 2023-01-07 | Disposition: A | Discharge status: Home / Self Care | Payer: 59 | Source: Ambulatory Visit | Attending: Internal Medicine | Admitting: Internal Medicine

## 2023-01-07 ENCOUNTER — Ambulatory Visit (INDEPENDENT_AMBULATORY_CARE_PROVIDER_SITE_OTHER): Payer: 59 | Admitting: Internal Medicine

## 2023-01-07 ENCOUNTER — Encounter: Payer: Self-pay | Admitting: Internal Medicine

## 2023-01-07 ENCOUNTER — Ambulatory Visit
Admission: RE | Admit: 2023-01-07 | Discharge: 2023-01-07 | Disposition: A | Discharge status: Home / Self Care | Payer: 59 | Attending: Internal Medicine | Admitting: Internal Medicine

## 2023-01-07 VITALS — BP 128/72 | HR 85 | Ht 63.0 in | Wt 174.6 lb

## 2023-01-07 DIAGNOSIS — M79641 Pain in right hand: Secondary | ICD-10-CM | POA: Insufficient documentation

## 2023-01-07 DIAGNOSIS — E119 Type 2 diabetes mellitus without complications: Secondary | ICD-10-CM | POA: Diagnosis not present

## 2023-01-07 DIAGNOSIS — J069 Acute upper respiratory infection, unspecified: Secondary | ICD-10-CM

## 2023-01-07 DIAGNOSIS — M25531 Pain in right wrist: Secondary | ICD-10-CM

## 2023-01-07 DIAGNOSIS — M19041 Primary osteoarthritis, right hand: Secondary | ICD-10-CM | POA: Diagnosis not present

## 2023-01-07 DIAGNOSIS — M7989 Other specified soft tissue disorders: Secondary | ICD-10-CM | POA: Diagnosis not present

## 2023-01-07 LAB — POCT XPERT XPRESS SARS COVID-2/FLU/RSV
FLU A: NEGATIVE
FLU B: NEGATIVE
RSV RNA, PCR: NEGATIVE
SARS Coronavirus 2: NEGATIVE

## 2023-01-07 LAB — POCT CBG (FASTING - GLUCOSE)-MANUAL ENTRY: Glucose Fasting, POC: 237 mg/dL — AB (ref 70–99)

## 2023-01-07 MED ORDER — AZITHROMYCIN 250 MG PO TABS
ORAL_TABLET | ORAL | 0 refills | Status: AC
Start: 2023-01-07 — End: 2023-01-11

## 2023-01-07 MED ORDER — BENZONATATE 100 MG PO CAPS: 100.0000 mg | ORAL_CAPSULE | Freq: Three times a day (TID) | ORAL | 1 refills | Status: DC | PRN

## 2023-01-07 MED ORDER — PREDNISONE 20 MG PO TABS
40.0000 mg | ORAL_TABLET | Freq: Every day | ORAL | 0 refills | Status: DC
Start: 2023-01-07 — End: 2023-01-21

## 2023-01-07 NOTE — Progress Notes (Signed)
Established Patient Office Visit  Subjective:  Patient ID: [name], female    DOB: 04-08-1946  Age: 77 y.o. MRN: [id number]  Chief Complaint  Patient presents with   Acute Visit    Swollen finger and test results    Patient comes in with 2 complaints today. Reports that few  weeks ago she developed swelling of right wrist and right hand, with pain and tenderness. Patient has h/x of RA and SLE. However time the swelling down except for 3rd Middle finger , which is still stiff and swollen. There is marked tenderness over PIP joint. No bruising and no h/x of trauma. Xrays done- only shows OA. Patient also has lingering cough and congestion after her Covid infection in May 2024. Recently her sputum turned colored, no fever or chills.No chest pain , no shortness of breath, but has nasal congestion. Will rx Zpak and Tessalon perles.    No other concerns at this time.   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 b.)07/18/2006 -endovas oblit complex ACOM aneur. c.) Endovas Tx of 6.8x58mm RMCA aneur. d.) 3.5x72mm remnant RMCA aneur 2/2 coil compaction. e.) Interval 3.2x3.75mm saccular outpouching in ACOM c/w mild recannulization in neck. 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 g.) 3.7x60mm remnant of prev Tx'd ACOM aneur -failed embol 02/24/2012.   CKD (chronic kidney disease), stage IV (HCC)    a.) solitary functioning kidney on the RIGHT   Complication of anesthesia    a.) delayed emergence   Coronary artery disease 02/23/2007   a.) LHC 02/23/2007 --> EF 50%; 30% pLAD, 70% mRCA --> planned for staged PCI. b.) PCI 02/27/2007: EF 60%; 3.5 x 15 mm Vision BMS to 80% mRCA. b.) LHC 07/24/2007: EF 60%; minor irregs; no occlusive CAD; no intervention. c.) LHC 10/28/2010: EF 60%; 30% mLAD, LCx with minor luminal irregs, 20% ISR mRCA; no intervention.  d.) LHC 05/31/2013: EF 60%; 40% mLAD, 30% ISR m-dRCA; no interventions.   DDD (degenerative disc disease), cervical    a.) s/p ACDF C5-C7; hardware in neck; patient appreciates stiffness and issues with mobility   Diastolic dysfunction    a.)  TTE 07/07/2021: EF 87.8%; normal PASP; trace TR/MR; G1DD.   Elevated hemidiaphragm    a.) LEFT   Headache(784.0)    Heart murmur    HLD (hyperlipidemia)    HOH (hard of hearing)    Has hearing aids, doesn't wear   Hyperparathyroidism due to renal insufficiency (HCC)    Hypertension    Incomplete right bundle branch block (RBBB)    Insomnia    a.) takes zolpidem   Long term (current) use of immunomodulator    a.) on DMARD therapy (hydroxychloriquine) for RA/SLE Dx.   Long term current use of antithrombotics/antiplatelets    a.) clopidogrel   Multiple acquired cysts of kidney    OSA (obstructive sleep apnea)    a.) does NOT use nocturnal pap therapy   Pancreatic cyst    Pneumonia    RBBB (right bundle branch block)    Rheumatoid arthritis (HCC)    a.) on DMARD; hydroxychloriquine   Systemic lupus erythematosus (HCC)    T2DM (type 2 diabetes mellitus) (HCC)    Wears dentures    partial lower    Past Surgical History:  Procedure Laterality Date   ANEURYSM COILING  08/2013  Attempted embolization of previously treated ACOM aneurysm; unsuccessful N/A 01/28/2012   Location: [facility]   BACK SURGERY     BARTHOLIN CYST MARSUPIALIZATION     BICEPT TENODESIS Right 03/02/2022   Procedure: BICEPS TENODESIS;  Surgeon: Christena Flake, MD;  Location: [facility];  Service: Orthopedics;  Laterality: Right;   BREAST CYST ASPIRATION Right 01/25/2012   FNA neg.   BREAST EXCISIONAL BIOPSY Left 06/26/2007   neg   BUNIONECTOMY Bilateral 2022   CARDIAC CATHETERIZATION Left 02/23/2007   Procedure: CARDIAC CATHETERIZATION; Location: [facility]; Surgeon: Adrian Blackwater, MD   CARDIAC CATHETERIZATION Left 07/31/2007   Procedure: CARDIAC CATHETERIZATION;  Location: [facility]; Surgeon: Adrian Blackwater, MD   CARDIAC CATHETERIZATION Left 10/28/2010   Procedure: CARDIAC CATHETERIZATION; Location: [facility]; Surgeon: Adrian Blackwater, MD   CARDIAC CATHETERIZATION Left 05/31/2013   Procedure: CARDIAC CATHETERIZATION; Location: [facility]; Surgeon: Adrian Blackwater, MD   Carotid arteriogram; 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 N/A 07/18/2006   Location: [facility]   CATARACT EXTRACTION     CERVICAL FUSION N/A    Procedure: ACDF C5-C7   COLONOSCOPY WITH PROPOFOL N/A 10/04/2019   Procedure: COLONOSCOPY WITH BIOPSY;  Surgeon: Toney Reil, MD;  Location: [facility];  Service: Endoscopy;  Laterality: N/A;  Diabetic (borderline) - oral meds priority 3   CORONARY STENT INTERVENTION Left 02/27/2007   Procedure: CORONARY STENT INTERVENTION (3.5 x 15 mm Vision BMS to mRCA); Location: [facility]; Surgeon: Lorine Bears, MD   Endovascular near complete obliteration of enlarging neck remnant of previously treated RIGHT MCA aneurysm using stent assisted coiling N/A 09/03/2010   Location: [facility]   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 N/A 10/03/2006   Location: [facility]   HYSTEROSCOPY WITH D & C N/A 08/04/2021   Procedure: DILATATION AND CURETTAGE /HYSTEROSCOPY;  Surgeon: Natale Milch, MD;  Location: [facility];  Service: Gynecology;  Laterality: N/A;   POLYPECTOMY N/A 10/04/2019   Procedure: POLYPECTOMY;  Surgeon: Toney Reil, MD;  Location: [facility];  Service: Endoscopy;  Laterality: N/A;   REVERSE SHOULDER ARTHROPLASTY Right 03/02/2022   Procedure: REVERSE SHOULDER ARTHROPLASTY;  Surgeon: Christena Flake, MD;  Location: [facility];  Service: Orthopedics;  Laterality: Right;   UPPER ESOPHAGEAL ENDOSCOPIC ULTRASOUND (EUS) N/A 04/15/2016   Procedure: UPPER ESOPHAGEAL ENDOSCOPIC ULTRASOUND (EUS);  Surgeon: Bearl Mulberry, MD;  Location: [facility]  END[facility];  Service: Gastroenterology;  Laterality: N/A;    Social History   Socioeconomic History   Marital status: Widowed    Spouse name: Not on file   Number of children: Not on file   Years of education: Not on file   Highest education level: Not on file  Occupational History   Not on file  Tobacco Use   Smoking status: Never   Smokeless tobacco: Never  Vaping Use   Vaping status: Never Used  Substance and Sexual Activity   Alcohol use: Yes    Comment: Occasionally at christmas   Drug use: No   Sexual activity: Not Currently    Birth control/protection: Post-menopausal  Other Topics Concern   Not on file  Social History Narrative   Not on file   Social Determinants of Health   Financial Resource Strain: Low Risk  (02/12/2022)   Overall Financial Resource Strain (CARDIA)    Difficulty of Paying Living Expenses: Not very hard  Food Insecurity: No Food Insecurity (03/02/2022)   Hunger Vital Sign    Worried About Running  Out of Food in the Last Year: Never true    Ran Out of Food in the Last Year: Never true  Transportation Needs: No Transportation Needs (03/02/2022)   PRAPARE - Administrator, Civil Service (Medical): No    Lack of Transportation (Non-Medical): No  Physical Activity: Not on file  Stress: No Stress Concern Present (02/26/2022)   [employer] of Occupational Health - Occupational Stress Questionnaire    Feeling of Stress : Not at all  Social Connections: Moderately Isolated (02/26/2022)   Social Connection and Isolation Panel [NHANES]    Frequency of Communication with Friends and Family: More than three times a week    Frequency of Social Gatherings with Friends and Family: More than three times a week    Attends Religious Services: Never    Database administrator or Organizations: Yes    Attends Banker Meetings: Never    Marital Status: Widowed  Intimate Partner Violence: Not At Risk (03/02/2022)   Humiliation, Afraid,  Rape, and Kick questionnaire    Fear of Current or Ex-Partner: No    Emotionally Abused: No    Physically Abused: No    Sexually Abused: No    Family History  Problem Relation Age of Onset   Breast cancer Paternal Aunt 69   Breast cancer Paternal Aunt    Heart disease Mother    Heart disease Father    Stroke Father    Diabetes Sister    Diabetes Brother     Allergies  Allergen Reactions   Isosorbide Dinitrate Other (See Comments)    Collapse   Amlodipine Swelling   Sulfa Antibiotics Itching   Tizanidine Hcl    Oxycodone Itching   Sulfasalazine Itching    Review of Systems  Constitutional:  Positive for malaise/fatigue. Negative for chills, diaphoresis, fever and weight loss.  HENT:  Positive for congestion and sinus pain. Negative for ear pain and sore throat.   Eyes: Negative.   Respiratory:  Positive for sputum production. Negative for cough, shortness of breath, wheezing and stridor.   Cardiovascular: Negative.  Negative for chest pain, palpitations and leg swelling.  Gastrointestinal: Negative.  Negative for abdominal pain, constipation, diarrhea, heartburn, nausea and vomiting.  Genitourinary: Negative.  Negative for dysuria and flank pain.  Musculoskeletal:  Positive for joint pain. Negative for myalgias.  Skin: Negative.  Negative for itching and rash.  Neurological: Negative.  Negative for dizziness and headaches.  Endo/Heme/Allergies: Negative.   Psychiatric/Behavioral: Negative.  Negative for depression and suicidal ideas. The patient is not nervous/anxious.        Objective:   BP 128/72   Pulse 85   Ht 5\' 3"  (1.6 m)   Wt 174 lb 9.6 oz (79.2 kg)   SpO2 97%   BMI 30.93 kg/m   Vitals:   01/07/23 1157  BP: 128/72  Pulse: 85  Height: 5\' 3"  (1.6 m)  Weight: 174 lb 9.6 oz (79.2 kg)  SpO2: 97%  BMI (Calculated): 30.94    Physical Exam Vitals and nursing note reviewed.  Constitutional:      Appearance: Normal appearance.  HENT:     Head:  Normocephalic and atraumatic.     Nose: Nose normal.     Mouth/Throat:     Mouth: Mucous membranes are moist.     Pharynx: Oropharynx is clear.  Eyes:     Conjunctiva/sclera: Conjunctivae normal.     Pupils: Pupils are equal, round, and reactive to light.  Cardiovascular:  Rate and Rhythm: Normal rate and regular rhythm.     Pulses: Normal pulses.     Heart sounds: Normal heart sounds. No murmur heard. Pulmonary:     Effort: Pulmonary effort is normal.     Breath sounds: Normal breath sounds. No wheezing.  Abdominal:     General: Bowel sounds are normal.     Palpations: Abdomen is soft.     Tenderness: There is no abdominal tenderness. There is no right CVA tenderness or left CVA tenderness.  Musculoskeletal:        General: Swelling (Right 3rd Middle finger.) present. Normal range of motion.     Cervical back: Normal range of motion.     Right lower leg: No edema.     Left lower leg: No edema.  Skin:    General: Skin is warm and dry.  Neurological:     General: No focal deficit present.     Mental Status: She is alert and oriented to person, place, and time.  Psychiatric:        Mood and Affect: Mood normal.        Behavior: Behavior normal.      Results for orders placed or performed in visit on 01/07/23  POCT CBG (Fasting - Glucose)  Result Value Ref Range   Glucose Fasting, POC 237 (A) 70 - 99 mg/dL  POCT XPERT XPRESS SARS COVID-2/FLU/RSV [[id number]]  Result Value Ref Range   SARS Coronavirus 2 negative    FLU A negative    FLU B negative    RSV RNA, PCR negative         Assessment & Plan:  Covid/Flu/RSV Negative again today. Start treatment sent to pharmacy. Problem List Items Addressed This Visit     Diabetes mellitus without complication (HCC) - Primary   Relevant Orders   POCT CBG (Fasting - Glucose) (Completed)   POCT XPERT XPRESS SARS COVID-2/FLU/RSV [[id number]] (Completed)   Other Visit Diagnoses     Right wrist pain       Relevant  Medications   predniSONE (DELTASONE) 20 MG tablet   Other Relevant Orders   DG Wrist Complete Right   Right hand pain       Relevant Orders   DG Hand Complete Right (Completed)   URI with cough and congestion       Relevant Medications   azithromycin (ZITHROMAX) 250 MG tablet   benzonatate (TESSALON PERLES) 100 MG capsule       Follow up as scheduled.  Total time spent: 30 minutes  Margaretann Loveless, MD  01/07/2023   This document may have been prepared by [employer] Voice Recognition software and as such may include unintentional dictation errors.

## 2023-01-11 DIAGNOSIS — N189 Chronic kidney disease, unspecified: Secondary | ICD-10-CM | POA: Diagnosis not present

## 2023-01-11 DIAGNOSIS — I129 Hypertensive chronic kidney disease with stage 1 through stage 4 chronic kidney disease, or unspecified chronic kidney disease: Secondary | ICD-10-CM | POA: Diagnosis not present

## 2023-01-11 DIAGNOSIS — N2581 Secondary hyperparathyroidism of renal origin: Secondary | ICD-10-CM | POA: Diagnosis not present

## 2023-01-11 DIAGNOSIS — E1122 Type 2 diabetes mellitus with diabetic chronic kidney disease: Secondary | ICD-10-CM | POA: Diagnosis not present

## 2023-01-11 DIAGNOSIS — I10 Essential (primary) hypertension: Secondary | ICD-10-CM | POA: Diagnosis not present

## 2023-01-11 DIAGNOSIS — N184 Chronic kidney disease, stage 4 (severe): Secondary | ICD-10-CM | POA: Diagnosis not present

## 2023-01-12 NOTE — Telephone Encounter (Signed)
Seen 01/07/23

## 2023-01-13 ENCOUNTER — Telehealth: Payer: Self-pay | Admitting: Internal Medicine

## 2023-01-13 NOTE — Telephone Encounter (Signed)
Letter typed and printed.   

## 2023-01-13 NOTE — Telephone Encounter (Signed)
Patient needs letter stating that she needs a bigger apartment so that her caregiver can stay with her overnight and have somewhere to sleep and stay. Needs letter this afternoon so that her apartment complex can secure her a bigger apartment.

## 2023-01-14 DIAGNOSIS — E119 Type 2 diabetes mellitus without complications: Secondary | ICD-10-CM | POA: Diagnosis not present

## 2023-01-21 ENCOUNTER — Encounter: Payer: Self-pay | Admitting: Neurosurgery

## 2023-01-21 ENCOUNTER — Inpatient Hospital Stay: Payer: 59 | Admitting: Urgent Care

## 2023-01-21 ENCOUNTER — Other Ambulatory Visit: Payer: Self-pay

## 2023-01-21 ENCOUNTER — Encounter
Admission: RE | Admit: 2023-01-21 | Discharge: 2023-01-21 | Disposition: A | Discharge status: Home / Self Care | Payer: 59 | Source: Ambulatory Visit | Attending: Neurosurgery | Admitting: Neurosurgery

## 2023-01-21 DIAGNOSIS — N289 Disorder of kidney and ureter, unspecified: Secondary | ICD-10-CM | POA: Diagnosis not present

## 2023-01-21 DIAGNOSIS — D649 Anemia, unspecified: Secondary | ICD-10-CM

## 2023-01-21 DIAGNOSIS — E119 Type 2 diabetes mellitus without complications: Secondary | ICD-10-CM | POA: Diagnosis not present

## 2023-01-21 DIAGNOSIS — R300 Dysuria: Secondary | ICD-10-CM | POA: Insufficient documentation

## 2023-01-21 DIAGNOSIS — Z01818 Encounter for other preprocedural examination: Secondary | ICD-10-CM | POA: Insufficient documentation

## 2023-01-21 DIAGNOSIS — J301 Allergic rhinitis due to pollen: Secondary | ICD-10-CM

## 2023-01-21 DIAGNOSIS — R8290 Unspecified abnormal findings in urine: Secondary | ICD-10-CM | POA: Insufficient documentation

## 2023-01-21 DIAGNOSIS — I10 Essential (primary) hypertension: Secondary | ICD-10-CM | POA: Diagnosis not present

## 2023-01-21 DIAGNOSIS — R8281 Pyuria: Secondary | ICD-10-CM | POA: Insufficient documentation

## 2023-01-21 DIAGNOSIS — R8271 Bacteriuria: Secondary | ICD-10-CM | POA: Diagnosis not present

## 2023-01-21 DIAGNOSIS — Z01812 Encounter for preprocedural laboratory examination: Secondary | ICD-10-CM

## 2023-01-21 HISTORY — DX: Hemorrhage of anus and rectum: K62.5

## 2023-01-21 HISTORY — DX: Abnormal findings on diagnostic imaging of other specified body structures: R93.89

## 2023-01-21 HISTORY — DX: Spondylolisthesis, lumbar region: M43.16

## 2023-01-21 HISTORY — DX: Post covid-19 condition, unspecified: U09.9

## 2023-01-21 HISTORY — DX: Chronic cough: R05.3

## 2023-01-21 HISTORY — DX: Gout, unspecified: M10.9

## 2023-01-21 HISTORY — DX: Other enthesopathies, not elsewhere classified: M77.8

## 2023-01-21 HISTORY — DX: Other intervertebral disc degeneration, lumbar region without mention of lumbar back pain or lower extremity pain: M51.369

## 2023-01-21 LAB — BASIC METABOLIC PANEL
Anion gap: 10 (ref 5–15)
BUN: 47 mg/dL — ABNORMAL HIGH (ref 8–23)
CO2: 22 mmol/L (ref 22–32)
Calcium: 9.4 mg/dL (ref 8.9–10.3)
Chloride: 114 mmol/L — ABNORMAL HIGH (ref 98–111)
Creatinine, Ser: 2.5 mg/dL — ABNORMAL HIGH (ref 0.44–1.00)
GFR, Estimated: 19 mL/min — ABNORMAL LOW (ref >60)
Glucose, Bld: 145 mg/dL — ABNORMAL HIGH (ref 70–99)
Potassium: 4.3 mmol/L (ref 3.5–5.1)
Sodium: 146 mmol/L — ABNORMAL HIGH (ref 135–145)

## 2023-01-21 LAB — URINALYSIS, ROUTINE W REFLEX MICROSCOPIC
Bilirubin Urine: NEGATIVE
Glucose, UA: NEGATIVE mg/dL
Hgb urine dipstick: NEGATIVE
Ketones, ur: NEGATIVE mg/dL
Nitrite: POSITIVE — AB
Protein, ur: 100 mg/dL — AB
Specific Gravity, Urine: 1.014 (ref 1.005–1.030)
WBC, UA: >50 WBC/hpf (ref 0–5)
pH: 6 (ref 5.0–8.0)

## 2023-01-21 LAB — TYPE AND SCREEN
ABO/RH(D): O POS
Antibody Screen: NEGATIVE

## 2023-01-21 LAB — CBC
HCT: 31.4 % — ABNORMAL LOW (ref 36.0–46.0)
Hemoglobin: 10.3 g/dL — ABNORMAL LOW (ref 12.0–15.0)
MCH: 29.4 pg (ref 26.0–34.0)
MCHC: 32.8 g/dL (ref 30.0–36.0)
MCV: 89.7 fL (ref 80.0–100.0)
Platelets: 155 10*3/uL (ref 150–400)
RBC: 3.5 MIL/uL — ABNORMAL LOW (ref 3.87–5.11)
RDW: 13 % (ref 11.5–15.5)
WBC: 4.4 10*3/uL (ref 4.0–10.5)
nRBC: 0 % (ref 0.0–0.2)

## 2023-01-21 LAB — SURGICAL PCR SCREEN
MRSA, PCR: NEGATIVE
Staphylococcus aureus: NEGATIVE

## 2023-01-21 NOTE — Patient Instructions (Addendum)
Your procedure is scheduled on: 02/02/23 - Wednesday Report to the Registration Desk on the 1st floor of the [facility]. To find out your arrival time, please call [phone] between 1PM - 3PM on: 02/01/23 - Tuesday If your arrival time is 6:00 am, do not arrive before that time as the [facility] entrance doors do not open until 6:00 am.  REMEMBER: Instructions that are not followed completely may result in serious medical risk, up to and including death; or upon the discretion of your surgeon and anesthesiologist your surgery may need to be rescheduled.  Do not eat food after midnight the night before surgery.  No gum chewing or hard candies.  You may drink water up to 2 hours before you are scheduled to arrive for your surgery. Do not drink anything within 2 hours of your scheduled arrival time.  Continue taking all prescribed medications with the exception of the following:   - Plavix:     Stop beginning 08/07.24, Plavix 7 days prior, resume Plavix 14 days after      - stop beginning 01/26/23 NSAIDS (Non-steroidal anti-inflammatory drugs): because you are having a fusion, no NSAIDS (such as ibuprofen, aleve, naproxen, meloxicam, diclofenac) for 3 months after surgery. Celebrex is an exception if you can normally tolerate this medication. Tylenol if you can normally tolerate this medication because it is not an NSAID.  Stop ANY OVER THE COUNTER supplements until after surgery.   TAKE ONLY THESE MEDICATIONS THE MORNING OF SURGERY WITH A SIP OF WATER:  allopurinol (ZYLOPRIM)  fluticasone (FLONASE)  hydrALAZINE (APRESOLINE)  hydroxychloroquine (PLAQUENIL)  labetalol (NORMODYNE)  rosuvastatin (CRESTOR)  SYMBICORT    No Alcohol for 24 hours before or after surgery.  No Smoking including e-cigarettes for 24 hours before surgery.  No chewable tobacco products for at least 6 hours before surgery.  No nicotine patches on the day of surgery.  Do not use any "recreational"  drugs for at least a week (preferably 2 weeks) before your surgery.  Please be advised that the combination of cocaine and anesthesia may have negative outcomes, up to and including death. If you test positive for cocaine, your surgery will be cancelled.  On the morning of surgery brush your teeth with toothpaste and water, you may rinse your mouth with mouthwash if you wish. Do not swallow any toothpaste or mouthwash.  Use CHG Soap or wipes as directed on instruction sheet.  Do not wear jewelry, make-up, hairpins, clips or nail polish.  Do not wear lotions, powders, or perfumes.   Contact lenses, hearing aids and dentures may not be worn into surgery.  Do not bring valuables to the hospital. [facility] is not responsible for any missing/lost belongings or valuables.   Notify your doctor if there is any change in your medical condition (cold, fever, infection).  Wear comfortable clothing (specific to your surgery type) to the hospital.  After surgery, you can help prevent lung complications by doing breathing exercises.  Take deep breaths and cough every 1-2 hours. Your doctor may order a device called an Incentive Spirometer to help you take deep breaths. When coughing or sneezing, hold a pillow firmly against your incision with both hands. This is called "splinting." Doing this helps protect your incision. It also decreases belly discomfort.  If you are being admitted to the hospital overnight, leave your suitcase in the car. After surgery it may be brought to your room.  In case of increased patient census, it may be necessary  for you, the patient, to continue your postoperative care in the Same Day Surgery department.  If you are being discharged the day of surgery, you will not be allowed to drive home. You will need a responsible individual to drive you home and stay with you for 24 hours after surgery.   If you are taking public transportation, you will need to have a  responsible individual with you.  Please call the Pre-admissions Testing Dept. at [phone] if you have any questions about these instructions.  Surgery Visitation Policy:  Patients having surgery or a procedure may have two visitors.  Children under the age of 96 must have an adult with them who is not the patient.  Inpatient Visitation:    Visiting hours are 7 a.m. to 8 p.m. Up to four visitors are allowed at one time in a patient room. The visitors may rotate out with other people during the day.  One visitor age 40 or older may stay with the patient overnight and must be in the room by 8 p.m.    Pre-operative 5 CHG Bath Instructions   You can play a key role in reducing the risk of infection after surgery. Your skin needs to be as free of germs as possible. You can reduce the number of germs on your skin by washing with CHG (chlorhexidine gluconate) soap before surgery. CHG is an antiseptic soap that kills germs and continues to kill germs even after washing.   DO NOT use if you have an allergy to chlorhexidine/CHG or antibacterial soaps. If your skin becomes reddened or irritated, stop using the CHG and notify one of our RNs at [phone].   Please shower with the CHG soap starting 4 days before surgery using the following schedule: 01/29/23 - 02/02/23.    Please keep in mind the following:  DO NOT shave, including legs and underarms, starting the day of your first shower.   You may shave your face at any point before/day of surgery.  Place clean sheets on your bed the day you start using CHG soap. Use a clean washcloth (not used since being washed) for each shower. DO NOT sleep with pets once you start using the CHG.   CHG Shower Instructions:  If you choose to wash your hair and private area, wash first with your normal shampoo/soap.  After you use shampoo/soap, rinse your hair and body thoroughly to remove shampoo/soap residue.  Turn the water OFF and apply about 3  tablespoons (45 ml) of CHG soap to a CLEAN washcloth.  Apply CHG soap ONLY FROM YOUR NECK DOWN TO YOUR TOES (washing for 3-5 minutes)  DO NOT use CHG soap on face, private areas, open wounds, or sores.  Pay special attention to the area where your surgery is being performed.  If you are having back surgery, having someone wash your back for you may be helpful. Wait 2 minutes after CHG soap is applied, then you may rinse off the CHG soap.  Pat dry with a clean towel  Put on clean clothes/pajamas   If you choose to wear lotion, please use ONLY the CHG-compatible lotions on the back of this paper.     Additional instructions for the day of surgery: DO NOT APPLY any lotions, deodorants, cologne, or perfumes.   Put on clean/comfortable clothes.  Brush your teeth.  Ask your nurse before applying any prescription medications to the skin.      CHG Compatible Lotions   Aveeno Moisturizing lotion  Cetaphil Moisturizing Cream  Cetaphil Moisturizing Lotion  Clairol Herbal Essence Moisturizing Lotion, Dry Skin  Clairol Herbal Essence Moisturizing Lotion, Extra Dry Skin  Clairol Herbal Essence Moisturizing Lotion, Normal Skin  Curel Age Defying Therapeutic Moisturizing Lotion with Alpha Hydroxy  Curel Extreme Care Body Lotion  Curel Soothing Hands Moisturizing Hand Lotion  Curel Therapeutic Moisturizing Cream, Fragrance-Free  Curel Therapeutic Moisturizing Lotion, Fragrance-Free  Curel Therapeutic Moisturizing Lotion, Original Formula  Eucerin Daily Replenishing Lotion  Eucerin Dry Skin Therapy Plus Alpha Hydroxy Crme  Eucerin Dry Skin Therapy Plus Alpha Hydroxy Lotion  Eucerin Original Crme  Eucerin Original Lotion  Eucerin Plus Crme Eucerin Plus Lotion  Eucerin TriLipid Replenishing Lotion  Keri Anti-Bacterial Hand Lotion  Keri Deep Conditioning Original Lotion Dry Skin Formula Softly Scented  Keri Deep Conditioning Original Lotion, Fragrance Free Sensitive Skin Formula  Keri  Lotion Fast Absorbing Fragrance Free Sensitive Skin Formula  Keri Lotion Fast Absorbing Softly Scented Dry Skin Formula  Keri Original Lotion  Keri Skin Renewal Lotion Keri Silky Smooth Lotion  Keri Silky Smooth Sensitive Skin Lotion  Nivea Body Creamy Conditioning Oil  Nivea Body Extra Enriched Teacher, adult education Moisturizing Lotion Nivea Crme  Nivea Skin Firming Lotion  NutraDerm 30 Skin Lotion  NutraDerm Skin Lotion  NutraDerm Therapeutic Skin Cream  NutraDerm Therapeutic Skin Lotion  ProShield Protective Hand Cream  Provon moisturizing lotion

## 2023-01-22 DIAGNOSIS — B9620 Unspecified Escherichia coli [E. coli] as the cause of diseases classified elsewhere: Secondary | ICD-10-CM

## 2023-01-22 DIAGNOSIS — Z01812 Encounter for preprocedural laboratory examination: Secondary | ICD-10-CM

## 2023-01-22 DIAGNOSIS — B3731 Acute candidiasis of vulva and vagina: Secondary | ICD-10-CM

## 2023-01-23 MED ORDER — CEPHALEXIN 250 MG PO CAPS
250.0000 mg | ORAL_CAPSULE | Freq: Three times a day (TID) | ORAL | 0 refills | Status: AC
Start: 2023-01-23 — End: 2023-01-28

## 2023-01-23 MED ORDER — FLUCONAZOLE 150 MG PO TABS
ORAL_TABLET | ORAL | 0 refills | Status: DC
Start: 2023-01-23 — End: 2023-03-01

## 2023-01-23 NOTE — Progress Notes (Signed)
[facility] Perioperative Services: Pre-Admission/Anesthesia Testing  Abnormal Lab Notification and Treatment Plan of Care   Date: 01/23/23  Name: [name] MRN:   [id number]  Re: Abnormal labs noted during PAT appointment   Notified:  Provider Name Provider Role Notification Mode  Venetia Night, MD Neurosurgery Routed and/or faxed via [facility]   Abnormal Lab Value(s):   Lab Results  Component Value Date   COLORURINE YELLOW (A) 01/21/2023   APPEARANCEUR CLOUDY (A) 01/21/2023   LABSPEC 1.014 01/21/2023   PHURINE 6.0 01/21/2023   GLUCOSEU NEGATIVE 01/21/2023   HGBUR NEGATIVE 01/21/2023   BILIRUBINUR NEGATIVE 01/21/2023   KETONESUR NEGATIVE 01/21/2023   PROTEINUR 100 (A) 01/21/2023   NITRITE POSITIVE (A) 01/21/2023   LEUKOCYTESUR LARGE (A) 01/21/2023   EPIU 0-5 01/21/2023   WBCU >50 01/21/2023   RBCU 0-5 01/21/2023   BACTERIA FEW (A) 01/21/2023   CULT >=100,000 COLONIES/mL ESCHERICHIA COLI (A) 01/21/2023    Clinical Information and Notes:  Patient is scheduled for L3-4 LATERAL LUMBAR INTERBODY FUSION; L3-S1 INSTRUMENTATION WITH L3-4 FUSION on 02/02/2023    UA performed in PAT consistent with/concerning for infection.  No leukocytosis noted on CBC; WBC 4.4 Renal function: Estimated Creatinine Clearance: 18.8 mL/min (A) (by C-G formula based on SCr of 2.5 mg/dL (H)). Urine C&S added to assess for pathogenically significant growth.  Impression and Plan:  Remee N Gibeault with a UA that was (+) for infection; reflex culture sent. Contacted patient to discuss. Patient reporting that she is experiencing lower back pain, suprapubic pain, and is voiding small amounts. She is also experiencing some vaginal itching. She denies overt abdominal pain, nausea/vomiting, and fever/chills. Patient with surgery scheduled soon. In efforts to avoid delaying patient's procedure, or have her experience any potentially significant perioperative complications  related to the aforementioned, I would like to proceed with treatment for urinary tract infection.  Allergies reviewed. Culture report also reviewed to ensure culture appropriate coverage is being provided. Will treat with a 5 day course of CEPHALEXIN. Given her significantly reduced renal function, dose reduction required. Patient encouraged to complete the entire course of antibiotics even if she begins to feel better.  Patient experiencing vaginal itching. Discussed possibility of vulvovaginal candidiasis that stands to worsen on antimicrobial therapy. Will send in prophylactic fluconazole dose (150 mg x 1) for patient to use should she feel like her symptoms are worsening. Reviewed with patient that single dose should be effective, as her renal function will increase the half life of this intervention to about 96 hours.    Meds ordered this encounter  Medications   cephALEXin (KEFLEX) 250 MG capsule    Sig: Take 1 capsule (250 mg total) by mouth 3 (three) times daily for 5 days. Increase WATER intake while taking this medication.    Dispense:  15 capsule    Refill:  0   fluconazole (DIFLUCAN) 150 MG tablet    Sig: Take 1 tablet (150 mg) PO x 1 dose.    Dispense:  1 tablet    Refill:  0   Patient encouraged to increase her fluid intake as much as possible. Discussed that water is always best to flush the urinary tract. She was advised to avoid caffeine containing fluids until her infections clears, as caffeine can cause her to experience painful bladder spasms.   May use Tylenol as needed for pain/fever should she experience these symptoms.  Patient instructed to call surgeon's office or PAT with any questions or concerns related to the  above outlined course of treatment. Additionally, she was instructed to call if she feels like she is getting worse overall while on treatment. Results and treatment plan of care forwarded to primary attending surgeon to make them aware.   Encounter  Diagnoses  Name Primary?   Pre-operative laboratory examination Yes   E. coli UTI (urinary tract infection)    Vulvovaginal candidiasis    Quentin Mulling, MSN, APRN, FNP-C, CEN [facility]  Peri-operative Services Nurse Practitioner Phone: [phone] Fax: [phone] 01/23/23 11:41 AM  NOTE: This note has been prepared using Dragon dictation software. Despite my best ability to proofread, there is always the potential that unintentional transcriptional errors may still occur from this process.

## 2023-01-25 NOTE — Progress Notes (Signed)
Perioperative / Anesthesia Services  Pre-Admission Testing Clinical Review / Preoperative Anesthesia Consult  Date: 01/25/23  Patient Demographics:  Name: [name] DOB:   Dec 19, 1945 MRN:   [id number]  Planned Surgical Procedure(s):    Case: [id number] Date/Time: 02/02/23 1230   Procedures:      L3-4 LATERAL LUMBAR INTERBODY FUSION     L3-S1 INSTRUMENTATION WITH L3-4 FUSION     APPLICATION OF INTRAOPERATIVE CT SCAN   Anesthesia type: General   Pre-op diagnosis: Lumbar adjacent segment disease with spondylolisthesis  - M51.36, M43.16   Location: [facility] OR ROOM 03 / [facility]   Surgeons: Venetia Night, MD     NOTE: Available PAT nursing documentation and vital signs have been reviewed. Clinical nursing staff has updated patient's PMH/PSHx, current medication list, and drug allergies/intolerances to ensure comprehensive history available to assist in medical decision making as it pertains to the aforementioned surgical procedure and anticipated anesthetic course. Extensive review of available clinical information personally performed. [place] [facility] and PSHx updated with any diagnoses/procedures that  may have been inadvertently omitted during her intake with the pre-admission testing department's nursing staff.  Clinical Discussion:  [name] is a 77 y.o. female who is submitted for pre-surgical anesthesia review and clearance prior to her undergoing the above procedure. Patient has never been a smoker. PMH includes: CAD, cardiac murmur, RBBB, multiple cerebral aneurysms (s/p coiling), elevated LEFT hemidiaphragm, diastolic dysfunction, RBBB, HTN, HLD, T2DM, hyperparathyroidism, CKD-IV (solitary functioning kidney), OSAH (does not use nocturnal PAP therapy), anemia of chronic renal disease, OA, cervical DDD (s/p ACDF), RA (on DMARD), SLE, insomnia.   Patient is followed by cardiology Welton Flakes, MD). She was last seen in the cardiology clinic on  09/21/2021; notes reviewed. At the time of her clinic visit, patient doing well overall from a cardiovascular perspective. Patient denied any chest pain, shortness of breath, PND, orthopnea, palpitations, significant peripheral edema, weakness, fatigue, vertiginous symptoms, or presyncope/syncope. Patient with a past medical history significant for cardiovascular diagnoses. Documented physical exam was grossly benign, providing no evidence of acute exacerbation and/or decompensation of the patient's known cardiovascular conditions.  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  ACOM aneurysms measured 6.2 x 4.5 mm and 4.9 x 4.2 mm.  RIGHT MCA aneurysm measured 7.0 x 4.5 mm.   Patient underwent endovascular obliteration of her complex ACOM aneurysms on 07/18/2006.   Patient underwent endovascular treatment of her RIGHT MCA aneurysm on 10/03/2006.   Patient noted to have 3.5 x 3.0 remnant of the RIGHT MCA trifurcation region aneurysm secondary to coil compaction on 09/14/2007.   Patient found to have 3.2 x 3.1 mm saccular outpouching in the previously treated ACOM aneurysm consistent with mild recannulization in the neck.   Patient underwent endovascular near complete obliteration of and enlarging neck aneurysm on 09/03/2010.  This was her previously treated RIGHT MCA aneurysm.  Procedure completed using stent assisted coiling.   Patient found to have a 3.7 x 2 mm neck remnant of a previously treated ACOM aneurysm on 01/28/2012.  She underwent attempted embolization procedure on 02/24/2012, however procedure was unsuccessful.   Patient underwent a diagnostic left heart catheterization on 02/23/2007 revealing normal left ventricular systolic function with an EF of 50%.  There was multivessel CAD; 30% proximal LAD and 70% mid RCA.  Staged PCI was planned for later date.  Patient ultimately underwent  staged PCI procedure on 02/27/2007.  EF at that time was 60%.  Patient with an 80% stenosis of the mid RCA.  3.5 x 15 mm Vision BMS x 1 placed with 0% residual stenosis and TIMI-3 flow.   Patient underwent subsequent cardiac catheterizations on 07/24/2007, 10/28/2010, and 05/31/2013 all showing moderate nonobstructive CAD with minimal ISR of previously placed stent to the mid RCA.  Cardiology has not been further interventions necessary at this point and are treating patient with aggressive medical management for further ASCVD prevention.   Coronary CTA performed on 05/20/2014 revealed a RIGHT dominant system. Coronary calcium score 51.8. LAD and LCx with mild disease proximal to the stent in the mid RCA.   Repeat coronary CTA performed on 05/22/2015 revealed a coronary calcium score 592.3.  Cardiologist noted there was no significant disease in the LAD or LCx.  There was mild disease proximal to the stent in the mid RCA, but no significant ISR.   Myocardial perfusion imaging study performed on 07/03/2021 revealed normal left ventricular ejection fraction of 65%  There was no evidence of stress-induced myocardial ischemia or arrhythmia.  Study determined to be normal and low risk.   TTE performed on 07/07/2021 revealed normal left ventricular systolic function with an EF of 87.8%.  Diastolic Doppler parameters consistent with impaired relaxation (G1DD).  PASP normal.  There was trace tricuspid and mitral valve regurgitation.  There was no evidence of a significant transvalvular gradient to suggest stenosis.  Aorta normal in size with no evidence of aneurysmal dilatation.  Patient with an older generation cardiac stent in place, therefore she is on daily antiplatelet therapy using clopidogrel; compliant with therapy with no evidence or reports of GI bleeding.  Blood pressure mildly elevated at 146/84 mmHg on currently prescribed diuretic (furosemide), vasodilator (hydralazine), beta-blocker (labetalol),  and ARB (losartan) therapies.  Patient is on rosuvastatin for her HLD diagnosis and further ASCVD prevention.  T2DM well-controlled on currently prescribed regimen; last Hgb A1c 6.9% when checked on 11/27/2020.  Patient does have an OSAH diagnosis, however she does not require the use of nocturnal PAP therapy. Functional capacity, as defined by DASI, is documented as being >/= 4 METS.  No changes were made to her medication regimen.  Patient to follow-up with outpatient cardiology in 4 months or sooner if needed.   [name] is scheduled for L3-4 LATERAL LUMBAR INTERBODY FUSION; L3-S1 INSTRUMENTATION WITH L3-4 FUSION on 02/02/2023 with Dr. Venetia Night, MD.  Given patient's past medical history significant for cardiovascular diagnoses, presurgical cardiac clearance was sought by the PAT team. Per cardiology, "this patient is optimized for surgery and may proceed with the planned procedural course with a ACCEPTABLE risk of significant perioperative cardiovascular complications".  Again, this patient is on daily oral antithrombotic therapy.  She has been instructed on recommendations for holding her clopidogrel for 7 days prior to her procedure with plans to restart as soon as postoperative bleeding risk felt to be minimized by her attending surgeon. The patient has been instructed that her last dose of her clopidogrel should be on 01/25/2023.  Patient reports previous perioperative complications with anesthesia in the past. She has experienced (+) delayed emergence from anesthesia in the past. In review of the available records, it is noted that patient underwent a general anesthetic course ACCEPTABLE (ASA III) in 02/2022 without documented complications.      01/21/2023    9:31 AM 01/21/2023    8:55 AM 01/07/2023   11:57 AM  Vitals with BMI  Height  5\' 3"  5'  3"  Weight  170 lbs 174 lbs 10 oz  BMI  30.12 30.94  Systolic 175  128  Diastolic 74  72  Pulse  72 85     Providers/Specialists:   NOTE: Primary physician provider listed below. Patient may have been seen by APP or partner within same practice.   PROVIDER ROLE / SPECIALTY LAST Donalynn Furlong, MD Neurosurgery (Surgeon) 12/09/2022  Margaretann Loveless, MD Primary Care Provider 01/07/2023  Adrian Blackwater, MD Cardiology 09/21/2021  Mosetta Pigeon, MD Nephrology 01/11/2023  Filomena Jungling, MD Physiatry 08/30/2022   Allergies:  Isosorbide dinitrate, Amlodipine, Sulfa antibiotics, Tizanidine hcl, Oxycodone, and Sulfasalazine  Current Home Medications:   No current facility-administered medications for this encounter.    allopurinol (ZYLOPRIM) 100 MG tablet   cephALEXin (KEFLEX) 250 MG capsule   clopidogrel (PLAVIX) 75 MG tablet   clotrimazole-betamethasone (LOTRISONE) cream   fluconazole (DIFLUCAN) 150 MG tablet   fluticasone (FLONASE) 50 MCG/ACT nasal spray   furosemide (LASIX) 40 MG tablet   gabapentin (NEURONTIN) 100 MG capsule   glimepiride (AMARYL) 2 MG tablet   hydrALAZINE (APRESOLINE) 100 MG tablet   hydroxychloroquine (PLAQUENIL) 200 MG tablet   labetalol (NORMODYNE) 300 MG tablet   losartan (COZAAR) 100 MG tablet   rosuvastatin (CRESTOR) 40 MG tablet   SYMBICORT 80-4.5 MCG/ACT inhaler   tretinoin (RETIN-A) 0.025 % cream   zolpidem (AMBIEN) 5 MG tablet   History:   Past Medical History:  Diagnosis Date   Anemia of chronic renal disease    Arthritis    Cerebral aneurysm 06/07/2006   a.) 6.2x4.33mm and 4.9x4.35mm ACOM & 7x4.6mm RMCA aneur. b.)07/18/2006 -endovas oblit complex ACOM aneur. c.) Endovas Tx of 6.8x83mm RMCA aneur. d.) 3.5x34mm remnant RMCA aneur 2/2 coil compaction. e.) Interval 3.2x3.59mm saccular outpouching in ACOM c/w mild recannulization in neck. 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 g.) 3.7x62mm remnant of prev Tx'd ACOM aneur -failed embol 02/24/2012.   CKD (chronic kidney disease), stage IV (HCC)    a.) solitary functioning kidney on the  RIGHT   Complication of anesthesia    a.) delayed emergence   Coronary artery disease 02/23/2007   a.) LHC 02/23/2007 --> EF 50%; 30% pLAD, 70% mRCA --> planned for staged PCI. b.) PCI 02/27/2007: EF 60%; 3.5 x 15 mm Vision BMS to 80% mRCA. b.) LHC 07/24/2007: EF 60%; minor irregs; no occlusive CAD; no intervention. c.) LHC 10/28/2010: EF 60%; 30% mLAD, LCx with minor luminal irregs, 20% ISR mRCA; no intervention. d.) LHC 05/31/2013: EF 60%; 40% mLAD, 30% ISR m-dRCA; no interventions.   DDD (degenerative disc disease), cervical    a.) s/p ACDF C5-C7; hardware in neck; patient appreciates stiffness and issues with mobility   Diastolic dysfunction    a.)  TTE 07/07/2021: EF 87.8%; normal PASP; trace TR/MR; G1DD.   Elevated LEFT hemidiaphragm    Gout    Headache(784.0)    Heart murmur    HLD (hyperlipidemia)    HOH (hard of hearing)    Has hearing aids, doesn't wear   Hyperparathyroidism due to renal insufficiency (HCC)    Hypertension    Incomplete right bundle branch block (RBBB)    Insomnia    a.) takes zolpidem   Long term (current) use of immunomodulator    a.) on DMARD therapy (hydroxychloroquine) for RA/SLE Dx.   Long term current use of antithrombotics/antiplatelets    a.) clopidogrel   Lumbar adjacent segment disease with spondylolisthesis    Multiple acquired cysts of  kidney    OSA (obstructive sleep apnea)    a.) does NOT use nocturnal pap therapy   Pancreatic cyst    Pneumonia    Post-COVID chronic cough    RBBB (right bundle branch block)    Rectal bleeding    Rheumatoid arthritis (HCC)    a.) on DMARD; hydroxychloriquine   Systemic lupus erythematosus (HCC)    T2DM (type 2 diabetes mellitus) (HCC)    Tendinitis of left wrist    Thickened endometrium    Wears dentures    partial lower   Past Surgical History:  Procedure Laterality Date   ANEURYSM COILING  08/2013   Attempted embolization of previously treated ACOM aneurysm; unsuccessful N/A 01/28/2012    Location: [facility]   BACK SURGERY     BARTHOLIN CYST MARSUPIALIZATION     BICEPT TENODESIS Right 03/02/2022   Procedure: BICEPS TENODESIS;  Surgeon: Christena Flake, MD;  Location: [facility] ORS;  Service: Orthopedics;  Laterality: Right;   BREAST CYST ASPIRATION Right 01/25/2012   FNA neg.   BREAST EXCISIONAL BIOPSY Left 06/26/2007   neg   BUNIONECTOMY Bilateral 2022   CARDIAC CATHETERIZATION Left 02/23/2007   Procedure: CARDIAC CATHETERIZATION; Location: [facility]; Surgeon: Adrian Blackwater, MD   CARDIAC CATHETERIZATION Left 07/31/2007   Procedure: CARDIAC CATHETERIZATION; Location: [facility]; Surgeon: Adrian Blackwater, MD   CARDIAC CATHETERIZATION Left 10/28/2010   Procedure: CARDIAC CATHETERIZATION; Location: [facility]; Surgeon: Adrian Blackwater, MD   CARDIAC CATHETERIZATION Left 05/31/2013   Procedure: CARDIAC CATHETERIZATION; Location: [facility]; Surgeon: Adrian Blackwater, MD   Carotid arteriogram; 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 N/A 07/18/2006   Location: [facility]   CATARACT EXTRACTION     CERVICAL FUSION N/A    Procedure: ACDF C5-C7   COLONOSCOPY WITH PROPOFOL N/A 10/04/2019   Procedure: COLONOSCOPY WITH BIOPSY;  Surgeon: Toney Reil, MD;  Location: [facility];  Service: Endoscopy;  Laterality: N/A;  Diabetic (borderline) - oral meds priority 3   CORONARY STENT INTERVENTION Left 02/27/2007   Procedure: CORONARY STENT INTERVENTION (3.5 x 15 mm Vision BMS to mRCA); Location: [facility]; Surgeon: Lorine Bears, MD   Endovascular near complete obliteration of enlarging neck remnant of previously treated RIGHT MCA aneurysm using stent assisted coiling N/A 09/03/2010   Location: [facility]   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 N/A 10/03/2006   Location: [facility]   HYSTEROSCOPY WITH D & C N/A 08/04/2021   Procedure: DILATATION AND CURETTAGE /HYSTEROSCOPY;  Surgeon: Natale Milch, MD;   Location: [facility] OR[facility];  Service: Gynecology;  Laterality: N/A;   POLYPECTOMY N/A 10/04/2019   Procedure: POLYPECTOMY;  Surgeon: Toney Reil, MD;  Location: [facility];  Service: Endoscopy;  Laterality: N/A;   REVERSE SHOULDER ARTHROPLASTY Right 03/02/2022   Procedure: REVERSE SHOULDER ARTHROPLASTY;  Surgeon: Christena Flake, MD;  Location: [facility] ORS;  Service: Orthopedics;  Laterality: Right;   UPPER ESOPHAGEAL ENDOSCOPIC ULTRASOUND (EUS) N/A 04/15/2016   Procedure: UPPER ESOPHAGEAL ENDOSCOPIC ULTRASOUND (EUS);  Surgeon: Bearl Mulberry, MD;  Location: [facility];  Service: Gastroenterology;  Laterality: N/A;   Family History  Problem Relation Age of Onset   Breast cancer Paternal Aunt 8   Breast cancer Paternal Aunt    Heart disease Mother    Heart disease Father    Stroke Father    Diabetes Sister    Diabetes Brother    Social History   Tobacco Use   Smoking status: Never   Smokeless  tobacco: Never  Vaping Use   Vaping status: Never Used  Substance Use Topics   Alcohol use: Yes    Comment: Occasionally at christmas   Drug use: No    Pertinent Clinical Results:  LABS:   No visits with results within 3 Day(s) from this visit.  Latest known visit with results is:  Hospital Outpatient Visit on 01/21/2023  Component Date Value Ref Range Status   WBC 01/21/2023 4.4  4.0 - 10.5 K/uL Final   RBC 01/21/2023 3.50 (L)  3.87 - 5.11 MIL/uL Final   Hemoglobin 01/21/2023 10.3 (L)  12.0 - 15.0 g/dL Final   HCT 86/57/8469 31.4 (L)  36.0 - 46.0 % Final   MCV 01/21/2023 89.7  80.0 - 100.0 fL Final   MCH 01/21/2023 29.4  26.0 - 34.0 pg Final   MCHC 01/21/2023 32.8  30.0 - 36.0 g/dL Final   RDW 62/95/2841 13.0  11.5 - 15.5 % Final   Platelets 01/21/2023 155  150 - 400 K/uL Final   nRBC 01/21/2023 0.0  0.0 - 0.2 % Final   Performed at [facility], [address] [place], [place] [address]   Sodium 01/21/2023 146 (H)  135 - 145 mmol/L Final   Potassium  01/21/2023 4.3  3.5 - 5.1 mmol/L Final   Chloride 01/21/2023 114 (H)  98 - 111 mmol/L Final   CO2 01/21/2023 22  22 - 32 mmol/L Final   Glucose, Bld 01/21/2023 145 (H)  70 - 99 mg/dL Final   Glucose reference range applies only to samples taken after fasting for at least 8 hours.   BUN 01/21/2023 47 (H)  8 - 23 mg/dL Final   Creatinine, Ser 01/21/2023 2.50 (H)  0.44 - 1.00 mg/dL Final   Calcium 04/17/2535 9.4  8.9 - 10.3 mg/dL Final   GFR, Estimated 01/21/2023 19 (L)  >60 mL/min Final   Comment: (NOTE) Calculated using the CKD-EPI Creatinine Equation (2021)    Anion gap 01/21/2023 10  5 - 15 Final   Performed at [facility], [address] [place], [place] [address]   MRSA, PCR 01/21/2023 NEGATIVE  NEGATIVE Final   Staphylococcus aureus 01/21/2023 NEGATIVE  NEGATIVE Final   Comment: (NOTE) The Xpert SA Assay (FDA approved for NASAL specimens in patients 76 years of age and older), is one component of a comprehensive surveillance program. It is not intended to diagnose infection nor to guide or monitor treatment. Performed at [facility], [address] [place], [place] [address]    ABO/RH(D) 01/21/2023 O POS   Final   Antibody Screen 01/21/2023 NEG   Final   Sample Expiration 01/21/2023 02/04/2023,2359   Final   Extend sample reason 01/21/2023    Final                   Value:NO TRANSFUSIONS OR PREGNANCY IN THE PAST 3 MONTHS Performed at [facility], [address] [place], [place] [address]    Color, Urine 01/21/2023 YELLOW (A)  YELLOW Final   APPearance 01/21/2023 CLOUDY (A)  CLEAR Final   Specific Gravity, Urine 01/21/2023 1.014  1.005 - 1.030 Final   pH 01/21/2023 6.0  5.0 - 8.0 Final   Glucose, UA 01/21/2023 NEGATIVE  NEGATIVE mg/dL Final   Hgb urine dipstick 01/21/2023 NEGATIVE  NEGATIVE Final   Bilirubin Urine 01/21/2023 NEGATIVE  NEGATIVE Final   Ketones, ur 01/21/2023 NEGATIVE  NEGATIVE mg/dL Final   Protein, ur 75/64/3329 100 (A)   NEGATIVE  mg/dL Final   Nitrite 11/91/4782 POSITIVE (A)  NEGATIVE Final   Leukocytes,Ua 01/21/2023 LARGE (A)  NEGATIVE Final   RBC / HPF 01/21/2023 0-5  0 - 5 RBC/hpf Final   WBC, UA 01/21/2023 >50  0 - 5 WBC/hpf Final   Bacteria, UA 01/21/2023 FEW (A)  NONE SEEN Final   Squamous Epithelial / HPF 01/21/2023 0-5  0 - 5 /HPF Final   WBC Clumps 01/21/2023 PRESENT   Final   Mucus 01/21/2023 PRESENT   Final   Performed at [facility], [address], [place], [place] [address]   Specimen Description 01/21/2023    Final                   Value:URINE, CLEAN CATCH Performed at [facility], [address], [place], [place] [address]    Special Requests 01/21/2023    Final                   Value:NONE Performed at [facility], [address] [place], [place] [address]    Culture 01/21/2023 >=100,000 COLONIES/mL ESCHERICHIA COLI (A)   Final   Report Status 01/21/2023 01/23/2023 FINAL   Final   Organism ID, Bacteria 01/21/2023 ESCHERICHIA COLI (A)   Final    ECG: Date: 01/21/2023 Time ECG obtained: 0953 AM Rate: 62 bpm Rhythm: normal sinus; RBBB Axis (leads I and aVF): Normal Intervals: PR 182 ms. QRS 144 ms. QTc 448 ms. ST segment and T wave changes: No evidence of acute ST segment elevation or depression.   Comparison: Similar to previous tracing obtained on 02/23/2022   IMAGING / PROCEDURES: MR ABDOMEN W WO CONTRAST performed on 12/28/2022 Large, complex, multiseptated cystic lesion of the central pancreatic head with hemorrhagic or proteinaceous contents and numerous contrast enhancing internal septations, measuring 4.7 x 4.3 cm. 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 Severely atrophic, dysplastic left kidney. Sludge in the gallbladder.  MR LUMBAR SPINE WO CONTRAST performed on 11/25/2022 Interval enlargement of a T2 hyperintense  retroperitoneal mass anterior to the aorta at L3 compared with prior PET-CT. 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 Further evaluation recommended with MRI of the abdomen without and with contrast. No acute findings are seen in the lumbar spine. No significant change from previous lumbar MRI approximately 14 months ago. Stable postsurgical changes at L4-5 and L5-S1. 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 No acute osseous findings.  MYOCARDIAL PERFUSION IMAGING STUDY (LEXISCAN) performed on 07/03/2021 LVEF 65% No significant ST changes  No evidence of stress-induced myocardial ischemia or arrhythmia Study determined to be normal and low risk   TRANSTHORACIC ECHOCARDIOGRAM performed on 07/07/2021 LVEF 87.8% Normal left ventricular systolic function with mild LVH Diastolic parameters consistent with impaired relaxation (G1DD) Normal right ventricular systolic/diastolic function Globally normal wall motion Trace TR and MR Normal PASP   MRI BRAIN WITHOUT CONTRAST performed on 07/15/2021 No acute intracranial abnormality Moderate chronic small vessel ischemic disease Major intracranial vascular flow voids are preserved Prior coiling of the RIGHT MCA and anterior communicating region aneurysms Evidence of previous BILATERAL cataract extraction Mild mucous retention cyst in the maxillary sinuses   DIAGNOSTIC RADIOGRAPHS OF CHEST performed on 07/15/2021 Stable cardiomediastinal silhouette Stable elevated LEFT hemidiaphragm No acute pulmonary  disease Bony thorax is unremarkable   MRI CERVICAL SPINE WO CONTRAST performed on 01/12/2021 At T1-T2, moderate to severe canal stenosis, mass effect on the cord, and suspected faint cord edema at this level. Severe right and moderate  left foraminal stenosis. Moderate to severe left foraminal stenosis at C2-C3 and C3-C4 Moderate left foraminal stenosis C4-C5. C5-C7 ACDF without significant stenosis at these levels. Mild canal stenosis at C2-C3 and T2-T3   LEFT HEART CATHETERIZATION AND CORONARY ANGIOGRAPHY performed on 05/31/2013 LVEF 60% Multivessel CAD 40% stenosis of the mid LAD 30% stenosis with ISR of the mid to distal RCA Recommendations Further intervention deferred Aggressive medical management for risk factor reduction and ASCVD prevention  Impression and Plan:  [name] has been referred for pre-anesthesia review and clearance prior to her undergoing the planned anesthetic and procedural courses. Available labs, pertinent testing, and imaging results were personally reviewed by me in preparation for upcoming operative/procedural course. [facility] medical record has been updated following extensive record review and patient interview with PAT staff.   This patient has been appropriately cleared by cardiology with an overall ACCEPTABLE risk of experiencing significant perioperative cardiovascular complications. Based on clinical review performed today (01/25/23), barring any significant acute changes in the patient's overall condition, it is anticipated that she will be able to proceed with the planned surgical intervention. Any acute changes in clinical condition may necessitate her procedure being postponed and/or cancelled. Patient will meet with anesthesia team (MD and/or CRNA) on the day of her procedure for preoperative evaluation/assessment. Questions regarding anesthetic course will be fielded at that time.   Pre-surgical instructions were reviewed with the patient during her PAT appointment, and questions were fielded to satisfaction by PAT clinical staff. She has been instructed on which medications that she will need to hold prior to surgery, as well as the ones that have been deemed  safe/appropriate to take on the day of her procedure. As part of the general education provided by PAT, patient made aware both verbally and in writing, that she would need to abstain from the use of any illegal substances during her perioperative course.  She was advised that failure to follow the provided instructions could necessitate case cancellation or result in serious perioperative complications up to and including death. Patient encouraged to contact PAT and/or her surgeon's office to discuss any questions or concerns that may arise prior to surgery; verbalized understanding.   Quentin Mulling, MSN, APRN, FNP-C, CEN [facility]  Peri-operative Services Nurse Practitioner Phone: [phone] Fax: [phone] 01/25/23 11:59 AM  NOTE: This note has been prepared using Dragon dictation software. Despite my best ability to proofread, there is always the potential that unintentional transcriptional errors may still occur from this process.

## 2023-02-01 ENCOUNTER — Telehealth: Payer: Self-pay

## 2023-02-01 MED ORDER — FAMOTIDINE 20 MG PO TABS
20.0000 mg | ORAL_TABLET | Freq: Once | ORAL | Status: AC
  Administered 2023-02-02: 20 mg via ORAL

## 2023-02-01 MED ORDER — SODIUM CHLORIDE 0.9 % IV SOLN: INTRAVENOUS | Status: DC

## 2023-02-01 MED ORDER — ORAL CARE MOUTH RINSE: 15.0000 mL | Freq: Once | OROMUCOSAL | Status: AC

## 2023-02-01 MED ORDER — CHLORHEXIDINE GLUCONATE 0.12 % MT SOLN
15.0000 mL | Freq: Once | OROMUCOSAL | Status: AC
  Administered 2023-02-02: 15 mL via OROMUCOSAL

## 2023-02-01 MED ORDER — CEFAZOLIN IN SODIUM CHLORIDE 2-0.9 GM/100ML-% IV SOLN
2.0000 g | Freq: Once | INTRAVENOUS | Status: DC
  Filled 2023-02-01: qty 100

## 2023-02-01 MED ORDER — CEFAZOLIN SODIUM-DEXTROSE 2-4 GM/100ML-% IV SOLN: 2.0000 g | INTRAVENOUS | Status: DC

## 2023-02-01 MED ORDER — VANCOMYCIN HCL IN DEXTROSE 1-5 GM/200ML-% IV SOLN: 1000.0000 mg | Freq: Once | INTRAVENOUS | Status: DC

## 2023-02-01 NOTE — Telephone Encounter (Signed)
Can you let her know that [facility] contacted me and informed me that they are no longer taking [employer]? I spoke with the case manager at the hospital and they said they will set her up with another agency before she leaves the hospital if physical therapy determines that it is needed after their evaluation before she goes home. Thanks!

## 2023-02-01 NOTE — Telephone Encounter (Signed)
Patient is aware and knows that if [facility] PT is needed the hospital case worker will set it up.

## 2023-02-02 ENCOUNTER — Ambulatory Visit
Admission: RE | Admit: 2023-02-02 | Discharge: 2023-02-02 | Disposition: A | Discharge status: Home / Self Care | Payer: 59 | Source: Ambulatory Visit | Attending: Neurosurgery | Admitting: Neurosurgery

## 2023-02-02 ENCOUNTER — Other Ambulatory Visit: Payer: Self-pay

## 2023-02-02 ENCOUNTER — Encounter
Admission: RE | Disposition: A | Discharge status: Home / Self Care | Payer: Self-pay | Source: Ambulatory Visit | Attending: Neurosurgery

## 2023-02-02 ENCOUNTER — Telehealth: Payer: Self-pay

## 2023-02-02 ENCOUNTER — Encounter: Payer: Self-pay | Admitting: Neurosurgery

## 2023-02-02 DIAGNOSIS — I2510 Atherosclerotic heart disease of native coronary artery without angina pectoris: Secondary | ICD-10-CM | POA: Insufficient documentation

## 2023-02-02 DIAGNOSIS — Z01812 Encounter for preprocedural laboratory examination: Secondary | ICD-10-CM

## 2023-02-02 DIAGNOSIS — E1122 Type 2 diabetes mellitus with diabetic chronic kidney disease: Secondary | ICD-10-CM | POA: Insufficient documentation

## 2023-02-02 DIAGNOSIS — N289 Disorder of kidney and ureter, unspecified: Secondary | ICD-10-CM

## 2023-02-02 DIAGNOSIS — E785 Hyperlipidemia, unspecified: Secondary | ICD-10-CM | POA: Diagnosis not present

## 2023-02-02 DIAGNOSIS — I129 Hypertensive chronic kidney disease with stage 1 through stage 4 chronic kidney disease, or unspecified chronic kidney disease: Secondary | ICD-10-CM | POA: Insufficient documentation

## 2023-02-02 DIAGNOSIS — Z01818 Encounter for other preprocedural examination: Secondary | ICD-10-CM | POA: Insufficient documentation

## 2023-02-02 DIAGNOSIS — Z7902 Long term (current) use of antithrombotics/antiplatelets: Secondary | ICD-10-CM | POA: Diagnosis not present

## 2023-02-02 DIAGNOSIS — I671 Cerebral aneurysm, nonruptured: Secondary | ICD-10-CM | POA: Insufficient documentation

## 2023-02-02 DIAGNOSIS — E119 Type 2 diabetes mellitus without complications: Secondary | ICD-10-CM

## 2023-02-02 DIAGNOSIS — M3214 Glomerular disease in systemic lupus erythematosus: Secondary | ICD-10-CM | POA: Diagnosis not present

## 2023-02-02 DIAGNOSIS — I4510 Unspecified right bundle-branch block: Secondary | ICD-10-CM | POA: Insufficient documentation

## 2023-02-02 DIAGNOSIS — G4700 Insomnia, unspecified: Secondary | ICD-10-CM | POA: Diagnosis not present

## 2023-02-02 LAB — POCT I-STAT, CHEM 8
BUN: 53 mg/dL — ABNORMAL HIGH (ref 8–23)
Calcium, Ion: 1.3 mmol/L (ref 1.15–1.40)
Chloride: 116 mmol/L — ABNORMAL HIGH (ref 98–111)
Creatinine, Ser: 2.9 mg/dL — ABNORMAL HIGH (ref 0.44–1.00)
Glucose, Bld: 110 mg/dL — ABNORMAL HIGH (ref 70–99)
HCT: 27 % — ABNORMAL LOW (ref 36.0–46.0)
Hemoglobin: 9.2 g/dL — ABNORMAL LOW (ref 12.0–15.0)
Potassium: 5.9 mmol/L — ABNORMAL HIGH (ref 3.5–5.1)
Sodium: 143 mmol/L (ref 135–145)
TCO2: 21 mmol/L — ABNORMAL LOW (ref 22–32)

## 2023-02-02 LAB — POTASSIUM: Potassium: 5.9 mmol/L — ABNORMAL HIGH (ref 3.5–5.1)

## 2023-02-02 LAB — GLUCOSE, CAPILLARY: Glucose-Capillary: 111 mg/dL — ABNORMAL HIGH (ref 70–99)

## 2023-02-02 SURGERY — ANTERIOR LATERAL LUMBAR FUSION WITH PERCUTANEOUS SCREW 1 LEVEL
Anesthesia: General

## 2023-02-02 MED ORDER — DEXAMETHASONE SODIUM PHOSPHATE 10 MG/ML IJ SOLN
INTRAMUSCULAR | Status: AC
  Filled 2023-02-02: qty 1

## 2023-02-02 MED ORDER — VANCOMYCIN HCL IN DEXTROSE 1-5 GM/200ML-% IV SOLN
INTRAVENOUS | Status: AC
  Filled 2023-02-02: qty 200

## 2023-02-02 MED ORDER — ACETAMINOPHEN 500 MG PO TABS
1000.0000 mg | ORAL_TABLET | Freq: Once | ORAL | Status: AC
  Administered 2023-02-02: 1000 mg via ORAL

## 2023-02-02 MED ORDER — ONDANSETRON HCL 4 MG/2ML IJ SOLN
INTRAMUSCULAR | Status: AC
  Filled 2023-02-02: qty 2

## 2023-02-02 MED ORDER — CHLORHEXIDINE GLUCONATE 0.12 % MT SOLN
OROMUCOSAL | Status: AC
  Filled 2023-02-02: qty 15

## 2023-02-02 MED ORDER — PROPOFOL 10 MG/ML IV BOLUS
INTRAVENOUS | Status: AC
  Filled 2023-02-02: qty 20

## 2023-02-02 MED ORDER — LIDOCAINE HCL (PF) 2 % IJ SOLN
INTRAMUSCULAR | Status: AC
  Filled 2023-02-02: qty 5

## 2023-02-02 MED ORDER — PHENYLEPHRINE HCL-NACL 20-0.9 MG/250ML-% IV SOLN
INTRAVENOUS | Status: AC
  Filled 2023-02-02: qty 250

## 2023-02-02 MED ORDER — GABAPENTIN 300 MG PO CAPS
300.0000 mg | ORAL_CAPSULE | Freq: Once | ORAL | Status: AC
  Administered 2023-02-02: 300 mg via ORAL

## 2023-02-02 MED ORDER — ACETAMINOPHEN 10 MG/ML IV SOLN
INTRAVENOUS | Status: AC
  Filled 2023-02-02: qty 100

## 2023-02-02 MED ORDER — FAMOTIDINE 20 MG PO TABS
ORAL_TABLET | ORAL | Status: AC
  Filled 2023-02-02: qty 1

## 2023-02-02 MED ORDER — ACETAMINOPHEN 500 MG PO TABS
ORAL_TABLET | ORAL | Status: AC
  Filled 2023-02-02: qty 1

## 2023-02-02 MED ORDER — GABAPENTIN 300 MG PO CAPS
ORAL_CAPSULE | ORAL | Status: AC
  Filled 2023-02-02: qty 1

## 2023-02-02 MED ORDER — CEFAZOLIN SODIUM-DEXTROSE 2-4 GM/100ML-% IV SOLN
INTRAVENOUS | Status: AC
  Filled 2023-02-02: qty 100

## 2023-02-02 MED ORDER — SUCCINYLCHOLINE CHLORIDE 200 MG/10ML IV SOSY
PREFILLED_SYRINGE | INTRAVENOUS | Status: AC
  Filled 2023-02-02: qty 10

## 2023-02-02 MED ORDER — FENTANYL CITRATE (PF) 100 MCG/2ML IJ SOLN
INTRAMUSCULAR | Status: AC
  Filled 2023-02-02: qty 2

## 2023-02-02 SURGICAL SUPPLY — 71 items
ADH SKN CLS APL DERMABOND .7 (GAUZE/BANDAGES/DRESSINGS) ×3
AGENT HMST KT MTR STRL THRMB (HEMOSTASIS) ×1
APL PRP STRL LF DISP 70% ISPRP (MISCELLANEOUS) ×2
BASIN KIT SINGLE STR (MISCELLANEOUS) ×2 IMPLANT
BUR NEURO DRILL SOFT 3.0X3.8M (BURR) ×2 IMPLANT
CHLORAPREP W/TINT 26 (MISCELLANEOUS) ×4 IMPLANT
CORD BIP STRL DISP 12FT (MISCELLANEOUS) ×2 IMPLANT
COVERAGE SUPPORT SPINE BRAINLB (MISCELLANEOUS)
DERMABOND ADVANCED .7 DNX12 (GAUZE/BANDAGES/DRESSINGS) ×6 IMPLANT
DRAPE C ARM PK CFD 31 SPINE (DRAPES) ×2 IMPLANT
DRAPE C-ARMOR (DRAPES) IMPLANT
DRAPE INCISE IOBAN 66X45 STRL (DRAPES) ×2 IMPLANT
DRAPE LAPAROTOMY 100X77 ABD (DRAPES) ×4 IMPLANT
DRAPE POUCH INSTRU U-SHP 10X18 (DRAPES) ×2 IMPLANT
DRAPE SCAN PATIENT (DRAPES) ×2 IMPLANT
DRAPE TABLE BACK 80X90 (DRAPES) ×2 IMPLANT
DRSG OPSITE POSTOP 4X6 (GAUZE/BANDAGES/DRESSINGS) IMPLANT
DRSG TEGADERM 2-3/8X2-3/4 SM (GAUZE/BANDAGES/DRESSINGS) IMPLANT
DRSG TEGADERM 4X4.75 (GAUZE/BANDAGES/DRESSINGS) IMPLANT
DRSG TEGADERM 6X8 (GAUZE/BANDAGES/DRESSINGS) IMPLANT
ELECT REM PT RETURN 9FT ADLT (ELECTROSURGICAL) ×2
ELECTRODE REM PT RTRN 9FT ADLT (ELECTROSURGICAL) ×2 IMPLANT
EX-PIN ORTHOLOCK NAV 4X150 (PIN) ×2 IMPLANT
FEE CVG SUPP BRAINLAB NG SPNE (MISCELLANEOUS) IMPLANT
FEE INTRAOP CADWELL SUPPLY NCS (MISCELLANEOUS) ×1 IMPLANT
FEE INTRAOP MONITOR IMPULS NCS (MISCELLANEOUS) ×1 IMPLANT
GAUZE 4X4 16PLY ~~LOC~~+RFID DBL (SPONGE) IMPLANT
GAUZE SPONGE 2X2 STRL 8-PLY (GAUZE/BANDAGES/DRESSINGS) IMPLANT
GLOVE BIOGEL PI IND STRL 6.5 (GLOVE) ×6 IMPLANT
GLOVE SURG SYN 6.5 ES PF (GLOVE) ×5
GLOVE SURG SYN 6.5 PF PI (GLOVE) ×5 IMPLANT
GLOVE SURG SYN 8.5 E (GLOVE) ×6
GLOVE SURG SYN 8.5 PF PI (GLOVE) ×6 IMPLANT
GOWN SRG LRG LVL 4 IMPRV REINF (GOWNS) ×4 IMPLANT
GOWN SRG XL LVL 3 NONREINFORCE (GOWNS) ×4 IMPLANT
GOWN STRL NON-REIN TWL XL LVL3 (GOWNS) ×2
GOWN STRL REIN LRG LVL4 (GOWNS) ×2
HOLDER FOLEY CATH W/STRAP (MISCELLANEOUS) ×2 IMPLANT
INTRAOP CADWELL SUPPLY FEE NCS (MISCELLANEOUS) ×1
INTRAOP DISP SUPPLY FEE NCS (MISCELLANEOUS) ×1
INTRAOP MONITOR FEE IMPULS NCS (MISCELLANEOUS) ×1
KIT PREVENA INCISION MGT 13 (CANNISTER) ×2 IMPLANT
KIT SPINAL PRONEVIEW (KITS) ×2 IMPLANT
KIT TURNOVER KIT A (KITS) ×2 IMPLANT
KNIFE BAYONET SHORT DISCETOMY (MISCELLANEOUS) IMPLANT
MANIFOLD NEPTUNE II (INSTRUMENTS) ×4 IMPLANT
MARKER SKIN DUAL TIP RULER LAB (MISCELLANEOUS) ×4 IMPLANT
MARKER SPHERE PSV REFLC 13MM (MARKER) ×14 IMPLANT
NDL SAFETY ECLIP 18X1.5 (MISCELLANEOUS) ×2 IMPLANT
NS IRRIG 1000ML POUR BTL (IV SOLUTION) ×2 IMPLANT
PACK LAMINECTOMY ARMC (PACKS) ×2 IMPLANT
PAD ARMBOARD 7.5X6 YLW CONV (MISCELLANEOUS) ×2 IMPLANT
PENCIL SMOKE EVACUATOR (MISCELLANEOUS) ×2 IMPLANT
SOLUTION IRRIG SURGIPHOR (IV SOLUTION) ×2 IMPLANT
STAPLER SKIN PROX 35W (STAPLE) IMPLANT
SURGIFLO W/THROMBIN 8M KIT (HEMOSTASIS) ×2 IMPLANT
SUT DVC VLOC 3-0 CL 6 P-12 (SUTURE) ×2 IMPLANT
SUT ETHILON 3-0 FS-10 30 BLK (SUTURE)
SUT VIC AB 0 CT1 27 (SUTURE) ×2
SUT VIC AB 0 CT1 27XCR 8 STRN (SUTURE) ×4 IMPLANT
SUT VIC AB 2-0 CT1 18 (SUTURE) ×4 IMPLANT
SUTURE EHLN 3-0 FS-10 30 BLK (SUTURE) IMPLANT
SYR 10ML LL (SYRINGE) ×2 IMPLANT
SYR 30ML LL (SYRINGE) ×4 IMPLANT
TAPE CLOTH 3X10 WHT NS LF (GAUZE/BANDAGES/DRESSINGS) ×8 IMPLANT
TOWEL OR 17X26 4PK STRL BLUE (TOWEL DISPOSABLE) ×4 IMPLANT
TRAP FLUID SMOKE EVACUATOR (MISCELLANEOUS) ×2 IMPLANT
TRAY FOLEY SLVR 16FR LF STAT (SET/KITS/TRAYS/PACK) IMPLANT
TUBING CONNECTING 10 (TUBING) ×2 IMPLANT
WATER STERILE IRR 1000ML POUR (IV SOLUTION) ×4 IMPLANT
WATER STERILE IRR 500ML POUR (IV SOLUTION) IMPLANT

## 2023-02-02 NOTE — Telephone Encounter (Signed)
Surgery was canceled today due to hyperkalemia.  Patient is aware that she will need to see her nephrologist for clearance prior to rescheduling.

## 2023-02-02 NOTE — Anesthesia Preprocedure Evaluation (Addendum)
Anesthesia Evaluation  Patient identified by MRN, date of birth, ID band Patient awake    Reviewed: Allergy & Precautions, H&P , NPO status , Patient's Chart, lab work & pertinent test results  History of Anesthesia Complications (+) PROLONGED EMERGENCE and history of anesthetic complications  Airway Mallampati: III  TM Distance: <3 FB Neck ROM: limited    Dental  (+) Poor Dentition, Caps, Dental Advidsory Given, Implants   Pulmonary neg pulmonary ROS, sleep apnea  Elevated LEFT hemidiaphragm   Pulmonary exam normal        Cardiovascular Exercise Tolerance: Good hypertension, (-) angina + CAD, + Cardiac Stents and + Peripheral Vascular Disease  (-) Past MI Normal cardiovascular exam+ dysrhythmias (RBBB) + Valvular Problems/Murmurs   TTE performed on 07/07/2021 revealed normal left ventricular systolic function with an EF of 87.8%.  Diastolic Doppler parameters consistent with impaired relaxation (G1DD).  PASP normal.  There was trace tricuspid and mitral valve regurgitation.  There was no evidence of a significant transvalvular gradient to suggest stenosis.  Aorta normal in size with no evidence of aneurysmal dilatation.   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  ACOM aneurysms measured 6.2 x 4.5 mm and 4.9 x 4.2 mm.  RIGHT MCA aneurysm measured 7.0 x 4.5 mm.  Patient underwent endovascular obliteration of her complex ACOM aneurysms on 07/18/2006.  Patient underwent endovascular treatment of her RIGHT MCA aneurysm on 10/03/2006.  Patient noted to have 3.5 x 3.0 remnant of the RIGHT MCA trifurcation region aneurysm secondary to coil compaction on 09/14/2007.  Patient found to have 3.2 x 3.1 mm saccular outpouching in the previously treated ACOM aneurysm consistent with mild recannulization in the  neck.  Patient underwent endovascular near complete obliteration of and enlarging neck aneurysm on 09/03/2010.  This was her previously treated RIGHT MCA aneurysm.  Procedure completed using stent assisted coiling.  Patient found to have a 3.7 x 2 mm neck remnant of a previously treated ACOM aneurysm on 01/28/2012.  She underwent attempted embolization procedure on 02/24/2012, however procedure was unsuccessful.   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   Past Medical History: No date: Anemia of chronic renal disease No date: Arthritis 06/07/2006: Cerebral aneurysm     Comment:  a.) 6.2x4.68mm and 4.9x4.41mm ACOM & 7x4.35mm RMCA aneur.               b.)07/18/2006 -endovas oblit complex ACOM aneur. c.)               Endovas Tx of 6.8x55mm RMCA aneur. d.) 3.5x41mm remnant               RMCA aneur 2/2 coil compaction. e.) Interval 3.2x3.55mm               saccular outpouching in ACOM c/w mild recannulization in               neck. 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  g.) 3.7x65mm remnant of prev Tx'd ACOM aneur -failed embol              02/24/2012. 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 b.) PCI 02/27/2007: EF 60%; 3.5 x              15 mm Vision BMS  to 80% mRCA. b.) LHC 07/24/2007: EF 60%;              minor irregs; no occlusive CAD; no intervention. c.) LHC               10/28/2010: EF 60%; 30% mLAD, LCx with minor luminal               irregs, 20% ISR mRCA; no intervention. d.) LHC               05/31/2013: EF 60%; 40% mLAD, 30% ISR m-dRCA; no               interventions. No date: Diastolic dysfunction     Comment:  a.)  TTE 07/07/2021: EF 87.8%; normal PASP; trace TR/MR;              G1DD. No date: Elevated hemidiaphragm     Comment:  a.) LEFT No date: Headache(784.0) No date: Heart murmur No date: HLD (hyperlipidemia) No date: HOH (hard of hearing)     Comment:  Has hearing aids, doesn't wear No date: Hyperparathyroidism due to renal insufficiency (HCC) No date: Hypertension No date: Incomplete right bundle branch block (RBBB) No date: Insomnia     Comment:  a.) takes zolpidem No date: Long term (current) use of immunomodulator     Comment:  a.) on DMARD therapy (hydroxychloriquine) for RA/SLE Dx. No date: Long term current use of antithrombotics/antiplatelets     Comment:  a.) clopidogrel No date: Multiple acquired cysts of kidney No date: Neck stiffness     Comment:  screws in neck.  Some limitations of movement No date: OSA (obstructive sleep apnea)     Comment:  a.) does NOT use nocturnal pap therapy No date: Pancreatic cyst No date: RBBB (right bundle branch block) No date: Rheumatoid arthritis (HCC)     Comment:  a.) on DMARD; hydroxychloriquine No date: Systemic lupus erythematosus (HCC) No date: T2DM (type 2 diabetes mellitus) (HCC) No date: Wears dentures     Comment:  partial lower   Reproductive/Obstetrics negative OB ROS                             Anesthesia Physical Anesthesia Plan  ASA: 3  Anesthesia Plan: General ETT   Post-op Pain Management:    Induction: Intravenous  PONV Risk Score and Plan: 3 and Ondansetron, Dexamethasone and Treatment may vary due to age or  medical condition  Airway Management Planned: Oral ETT  Additional Equipment:   Intra-op Plan:   Post-operative Plan: Extubation in OR  Informed Consent: I have reviewed the patients History and Physical, chart, labs and discussed the procedure including the risks, benefits and alternatives for the proposed anesthesia  with the patient or authorized representative who has indicated his/her understanding and acceptance.     Dental Advisory Given  Plan Discussed with: Anesthesiologist, CRNA and Surgeon  Anesthesia Plan Comments: (Patient consented for risks of anesthesia including but not limited to:  - adverse reactions to medications - damage to eyes, teeth, lips or other oral mucosa - nerve damage due to positioning  - sore throat or hoarseness - Damage to heart, brain, nerves, lungs, other parts of body or loss of life  Patient voiced understanding.   Update 15:30 on 02/02/23: case canceled for potassium 5.9, verified in lab.  Discussed with patient and husband, both understand and agree to make appointment with Dr. Thedore Mins.  Also discussed with Dr. Myer Haff.)        Anesthesia Quick Evaluation

## 2023-02-03 DIAGNOSIS — N261 Atrophy of kidney (terminal): Secondary | ICD-10-CM | POA: Diagnosis not present

## 2023-02-03 DIAGNOSIS — I129 Hypertensive chronic kidney disease with stage 1 through stage 4 chronic kidney disease, or unspecified chronic kidney disease: Secondary | ICD-10-CM | POA: Diagnosis not present

## 2023-02-03 DIAGNOSIS — N184 Chronic kidney disease, stage 4 (severe): Secondary | ICD-10-CM | POA: Diagnosis not present

## 2023-02-03 DIAGNOSIS — E875 Hyperkalemia: Secondary | ICD-10-CM | POA: Diagnosis not present

## 2023-02-03 DIAGNOSIS — D631 Anemia in chronic kidney disease: Secondary | ICD-10-CM | POA: Diagnosis not present

## 2023-02-07 ENCOUNTER — Other Ambulatory Visit: Payer: Self-pay | Admitting: Internal Medicine

## 2023-02-07 DIAGNOSIS — E875 Hyperkalemia: Secondary | ICD-10-CM | POA: Diagnosis not present

## 2023-02-08 ENCOUNTER — Other Ambulatory Visit: Payer: Self-pay | Admitting: Internal Medicine

## 2023-02-08 ENCOUNTER — Telehealth: Payer: Self-pay

## 2023-02-08 ENCOUNTER — Ambulatory Visit: Payer: Self-pay

## 2023-02-08 DIAGNOSIS — R809 Proteinuria, unspecified: Secondary | ICD-10-CM | POA: Diagnosis not present

## 2023-02-08 DIAGNOSIS — I10 Essential (primary) hypertension: Secondary | ICD-10-CM | POA: Diagnosis not present

## 2023-02-08 DIAGNOSIS — N184 Chronic kidney disease, stage 4 (severe): Secondary | ICD-10-CM | POA: Diagnosis not present

## 2023-02-08 DIAGNOSIS — D631 Anemia in chronic kidney disease: Secondary | ICD-10-CM | POA: Diagnosis not present

## 2023-02-08 DIAGNOSIS — M4316 Spondylolisthesis, lumbar region: Secondary | ICD-10-CM

## 2023-02-08 DIAGNOSIS — E119 Type 2 diabetes mellitus without complications: Secondary | ICD-10-CM | POA: Diagnosis not present

## 2023-02-08 DIAGNOSIS — N2581 Secondary hyperparathyroidism of renal origin: Secondary | ICD-10-CM | POA: Diagnosis not present

## 2023-02-08 DIAGNOSIS — E875 Hyperkalemia: Secondary | ICD-10-CM | POA: Diagnosis not present

## 2023-02-08 MED ORDER — ZOLPIDEM TARTRATE 5 MG PO TABS: 5.0000 mg | ORAL_TABLET | Freq: Every evening | ORAL | 1 refills | Status: DC | PRN

## 2023-02-08 NOTE — Patient Outreach (Signed)
  Care Coordination   Follow Up Visit Note   02/08/2023 Name: [name] MRN: [id number] DOB: 09/26/1945  [name] is a 77 y.o. year old female who sees Cathy Loveless, MD for primary care. I spoke with  [name] by phone today.  What matters to the patients health and wellness today?  Patient reports her back surgery was cancelled due to elevated potassium levels.  Patient states she was drinking too much orange juice and coconut water which caused the elevation.   Patient states she had repeat potassium lab drawn today and results were 2.7.  Patient states she took a copy of lab results by her surgeon's office and will wait for call regarding rescheduling back surgery.  Patient reports BP readings are ranging from 140/77 to 180's/80's.  Discussed with patient per provider chart review most recent Hgb A1c on 01/21/23 was 10.3.   Patient reports blood sugars are ranging from 170'2 to 190's.   Goals Addressed             This Visit's Progress    Patient Stated:  " I want to manage my arthritis pain and health conditions"       Interventions Today    Flowsheet Row Most Recent Value  Chronic Disease   Chronic disease during today's visit Diabetes, Chronic Kidney Disease/End Stage Renal Disease (ESRD), Hypertension (HTN), Other  [lumbar stenosis, swollen right hand/ finger, status post COVID]  General Interventions   General Interventions Discussed/Reviewed General Interventions Reviewed, Doctor Visits, Labs  [place] of treatment plan for health conditions and patients adherence to plan as established by provider.  Assessed for blood sugar, blood pressure, ongoing swelling in right hand finger.]  Labs Kidney Function  [Discussed kidney function lab and most recent Hgb A1c and goal]  Doctor Visits Discussed/Reviewed Doctor Visits Reviewed  [name] upcoming provider visits.  Advised patient to call office or send message to primary care provider regarding  imaging results related to pancrease. Advised to keep follow up visit iwth providers.]  Education Interventions   Education Provided Provided Education, Provided Printed Education  [sent patient education article in Mychart on managing diabetes and blood pressure]  Provided Verbal Education On Blood Sugar Monitoring, Other  [advised to continue monitoring blood pressure and blood sugars and recording.  Advised to notify provider of readings outside of established parameters. Advised to take BP readings to follow up appoint with nephrologist.]  Nutrition Interventions   Nutrition Discussed/Reviewed Nutrition Reviewed, Decreasing sugar intake  [Advised to not drink orange juice/ coconut water due to high potassium content as recommended by provider.]  Pharmacy Interventions   Pharmacy Dicussed/Reviewed Pharmacy Topics Reviewed  [medications reviewed. Discussed importance of compliance.  Confirmed patient taking hydralazine as prescribed by nephrologist.]              SDOH assessments and interventions completed:  No     Care Coordination Interventions:  Yes, provided   Follow up plan: Follow up call scheduled for 02/23/23    Encounter Outcome:  Pt. Visit Completed   George Ina RN,BSN,CCM [facility] Care Coordination [phone] direct line

## 2023-02-08 NOTE — Patient Instructions (Signed)
Visit Information  Thank you for taking time to visit with me today. Please don't hesitate to contact me if I can be of assistance to you.   Following are the goals we discussed today:   Goals Addressed             This Visit's Progress    Patient Stated:  " I want to manage my arthritis pain and health conditions"       Interventions Today    Flowsheet Row Most Recent Value  Chronic Disease   Chronic disease during today's visit Diabetes, Chronic Kidney Disease/End Stage Renal Disease (ESRD), Hypertension (HTN), Other  [lumbar stenosis, swollen right hand/ finger, status post COVID]  General Interventions   General Interventions Discussed/Reviewed General Interventions Reviewed, Doctor Visits, Labs  [place] of treatment plan for health conditions and patients adherence to plan as established by provider.  Assessed for blood sugar, blood pressure, ongoing swelling in right hand finger.]  Labs Kidney Function  [Discussed kidney function lab and most recent Hgb A1c and goal]  Doctor Visits Discussed/Reviewed Doctor Visits Reviewed  Annabell Sabal upcoming provider visits.  Advised patient to call office or send message to primary care provider regarding imaging results related to pancrease. Advised to keep follow up visit iwth providers.]  Education Interventions   Education Provided Provided Education, Provided Printed Education  [sent patient education article in Mychart on managing diabetes and blood pressure]  Provided Verbal Education On Blood Sugar Monitoring, Other  [advised to continue monitoring blood pressure and blood sugars and recording.  Advised to notify provider of readings outside of established parameters. Advised to take BP readings to follow up appoint with nephrologist.]  Nutrition Interventions   Nutrition Discussed/Reviewed Nutrition Reviewed, Decreasing sugar intake  [Advised to not drink orange juice/ coconut water due to high potassium content as recommended by  provider.]  Pharmacy Interventions   Pharmacy Dicussed/Reviewed Pharmacy Topics Reviewed  [medications reviewed. Discussed importance of compliance.  Confirmed patient taking hydralazine as prescribed by nephrologist. Advised prednisone can cause blood sugars to elevate.]              Our next appointment is by telephone on 02/23/23 at 3 pm  Please call the care guide team at [phone] if you need to cancel or reschedule your appointment.   If you are experiencing a Mental Health or Behavioral Health Crisis or need someone to talk to, please call the Suicide and Crisis Lifeline: 988 call 1-800-273-TALK (toll free, 24 hour hotline)  Patient verbalizes understanding of instructions and care plan provided today and agrees to view in MyChart. Active MyChart status and patient understanding of how to access instructions and care plan via MyChart confirmed with patient.     George Ina RN,BSN,CCM [facility] Care Coordination [phone] direct line Type 2 Diabetes Mellitus, Self-Care, Adult When you have type 2 diabetes (type 2 diabetes mellitus), you must make sure your blood sugar (glucose) stays in a healthy range. You can do this with: Nutrition. Exercise. Lifestyle changes. Medicines or insulin, if needed. Support from your doctors and others. What are the risks? Having type 2 diabetes can raise your risk for other long-term (chronic) health problems. You may get medicines to help prevent these problems. How to stay aware of your blood sugar  Check your blood sugar level every day, as often as told. Have your A1C (hemoglobin A1C) level checked two or more times a year. Have it checked more often if told. Your doctor will set personal treatment goals for  you. In general, you should have these blood sugar levels: Before meals: 80-130 mg/dL (3.6-6.4 mmol/L). After meals: below 180 mg/dL (10 mmol/L). A1C: less than 7%. How to manage high and low blood sugar Symptoms of high blood  sugar High blood sugar is also called hyperglycemia. Know the symptoms of high blood sugar. These may include: More thirst. Hunger. Feeling very tired. Needing to pee (urinate) more often than normal. Seeing things blurry. Symptoms of low blood sugar Low blood sugar is also called hypoglycemia. This is when blood sugar is at or below 70 mg/dL (3.9 mmol/L). Symptoms may include: Hunger. Feeling worried or nervous (anxious). Feeling sweaty and cold to the touch (clammy). Being dizzy or light-headed. Feeling sleepy. A fast heartbeat. Feeling grouchy (irritable). Tingling or loss of feeling (numbness) around your mouth, lips, or tongue. Restless sleep. Diabetes medicines can cause low blood sugar. You are more at risk: While you exercise. After exercise. During sleep. When you are sick. When you skip meals or do not eat for a long time. Treating low blood sugar If you think you have low blood sugar, eat or drink something sugary right away. Keep 15 grams of a fast-acting carb (carbohydrate) with you all the time. Make sure your family and friends know how to treat you if you cannot treat yourself. Treating very low blood sugar Severe hypoglycemia is when your blood sugar is at or below 54 mg/dL (3 mmol/L). Severe hypoglycemia is an emergency. Get medical help right away. Call your local emergency services (911 in the [place] Do not wait to see if the symptoms will go away. Do not drive yourself to the hospital. You may need a glucagon shot if you have very low blood sugar and you cannot eat or drink. Have a family member or friend learn how to check your blood sugar and how to give you a glucagon shot. Ask your doctor if you should have a kit for glucagon shots. Follow these instructions at home: Medicines Take prescribed insulin or diabetes medicines as told by your health care provider. Do not run out of insulin or other medicines. Plan ahead. If you use insulin, change the amount  you take based on how active you are and what foods you eat. Your doctor will tell you how to do this. Take over-the-counter and prescription medicines only as told by your doctor. Eating and drinking  Eat healthy foods. These include: Low-fat (lean) proteins. Complex carbs, such as whole grains. Fresh fruits and vegetables. Low-fat dairy products. Healthy fats. Meet with a food expert (dietitian) to make an eating plan. Follow instructions from your doctor about what you cannot eat or drink. Drink enough fluid to keep your pee (urine) pale yellow. Keep track of carbs that you eat. Read food labels and learn serving sizes of foods. Follow your sick-day plan when you cannot eat or drink as normal. Make this plan with your doctor so it is ready to use. Activity Exercise as told by your doctor. You may need to: Do stretching and strength exercises two or more times a week. Do 150 minutes or more of exercise each week that makes your heart beat faster and makes you sweat. Spread out your exercise over 3 or more days a week. Do not go more than 2 days in a row without exercise. Talk with your doctor before you start a new exercise. Your doctor may tell you to change: How much insulin or medicines you take. How much food you eat. Lifestyle  Do not smoke or use any products that contain nicotine or tobacco. If you need help quitting, ask your doctor. If you drink alcohol and your doctor says that it is safe for you: Limit how much you have to: 0-1 drink a day for women who are not pregnant. 0-2 drinks a day for men. Know how much alcohol is in your drink. In the [place] one drink equals one 12 oz bottle of beer (355 mL), one 5 oz glass of wine (148 mL), or one 1 oz glass of hard liquor (44 mL). Learn to deal with stress. If you need help, ask your doctor. Body care  Stay up to date with your shots (immunizations). Have your eyes and feet checked by a doctor as often as told. Check your  skin and feet every day. Check for cuts, bruises, redness, blisters, or sores. Brush your teeth and gums two times a day. Floss one or more times a day. Go to the dentist one or more times every 6 months. Stay at a healthy weight. General instructions Share your diabetes care plan with: Your work or school. People you live with. Carry a card or wear jewelry that says you have diabetes. Keep all follow-up visits. Questions to ask your doctor Do I need to meet with a certified expert in diabetes education and care? Where can I find a support group? Where to find more information For help and guidance and more information about diabetes, please go to: American Diabetes Association: www.diabetes.org American Association of Diabetes Care and Education Specialists: www.diabeteseducator.org International Diabetes Federation: DCOnly.dk Summary When you have type 2 diabetes, you must make sure your blood sugar (glucose) stays in a healthy range. You can do this with nutrition, exercise, medicines and insulin, and support from doctors and others. Check your blood sugar every day, or as often as told. Having diabetes can raise your risk for other long-term health problems. You may get medicines to help prevent these problems. Share your diabetes management plan with people at work, school, and home. Keep all follow-up visits. This information is not intended to replace advice given to you by your health care provider. Make sure you discuss any questions you have with your health care provider. Document Revised: 09/01/2020 Document Reviewed: 09/01/2020 Elsevier Patient Education  2024 Elsevier Inc.  Managing Your Hypertension Hypertension, also called high blood pressure, is when the force of the blood pressing against the walls of the arteries is too strong. Arteries are blood vessels that carry blood from your heart throughout your body. Hypertension forces the heart to work harder to pump  blood and may cause the arteries to become narrow or stiff. Understanding blood pressure readings A blood pressure reading includes a higher number over a lower number: The first, or top, number is called the systolic pressure. It is a measure of the pressure in your arteries as your heart beats. The second, or bottom number, is called the diastolic pressure. It is a measure of the pressure in your arteries as the heart relaxes. For most people, a normal blood pressure is below 120/80. Your personal target blood pressure may vary depending on your medical conditions, your age, and other factors. Blood pressure is classified into four stages. Based on your blood pressure reading, your health care provider may use the following stages to determine what type of treatment you need, if any. Systolic pressure and diastolic pressure are measured in a unit called millimeters of mercury (mmHg). Normal Systolic pressure: below 120.  Diastolic pressure: below 80. Elevated Systolic pressure: 120-129. Diastolic pressure: below 80. Hypertension stage 1 Systolic pressure: 130-139. Diastolic pressure: 80-89. Hypertension stage 2 Systolic pressure: 140 or above. Diastolic pressure: 90 or above. How can this condition affect me? Managing your hypertension is very important. Over time, hypertension can damage the arteries and decrease blood flow to parts of the body, including the brain, heart, and kidneys. Having untreated or uncontrolled hypertension can lead to: A heart attack. A stroke. A weakened blood vessel (aneurysm). Heart failure. Kidney damage. Eye damage. Memory and concentration problems. Vascular dementia. What actions can I take to manage this condition? Hypertension can be managed by making lifestyle changes and possibly by taking medicines. Your health care provider will help you make a plan to bring your blood pressure within a normal range. You may be referred for counseling on a healthy  diet and physical activity. Nutrition  Eat a diet that is high in fiber and potassium, and low in salt (sodium), added sugar, and fat. An example eating plan is called the DASH diet. DASH stands for Dietary Approaches to Stop Hypertension. To eat this way: Eat plenty of fresh fruits and vegetables. Try to fill one-half of your plate at each meal with fruits and vegetables. Eat whole grains, such as whole-wheat pasta, brown rice, or whole-grain bread. Fill about one-fourth of your plate with whole grains. Eat low-fat dairy products. Avoid fatty cuts of meat, processed or cured meats, and poultry with skin. Fill about one-fourth of your plate with lean proteins such as fish, chicken without skin, beans, eggs, and tofu. Avoid pre-made and processed foods. These tend to be higher in sodium, added sugar, and fat. Reduce your daily sodium intake. Many people with hypertension should eat less than 1,500 mg of sodium a day. Lifestyle  Work with your health care provider to maintain a healthy body weight or to lose weight. Ask what an ideal weight is for you. Get at least 30 minutes of exercise that causes your heart to beat faster (aerobic exercise) most days of the week. Activities may include walking, swimming, or biking. Include exercise to strengthen your muscles (resistance exercise), such as weight lifting, as part of your weekly exercise routine. Try to do these types of exercises for 30 minutes at least 3 days a week. Do not use any products that contain nicotine or tobacco. These products include cigarettes, chewing tobacco, and vaping devices, such as e-cigarettes. If you need help quitting, ask your health care provider. Control any long-term (chronic) conditions you have, such as high cholesterol or diabetes. Identify your sources of stress and find ways to manage stress. This may include meditation, deep breathing, or making time for fun activities. Alcohol use Do not drink alcohol if: Your  health care provider tells you not to drink. You are pregnant, may be pregnant, or are planning to become pregnant. If you drink alcohol: Limit how much you have to: 0-1 drink a day for women. 0-2 drinks a day for men. Know how much alcohol is in your drink. In the [place] one drink equals one 12 oz bottle of beer (355 mL), one 5 oz glass of wine (148 mL), or one 1 oz glass of hard liquor (44 mL). Medicines Your health care provider may prescribe medicine if lifestyle changes are not enough to get your blood pressure under control and if: Your systolic blood pressure is 130 or higher. Your diastolic blood pressure is 80 or higher. Take medicines only as told  by your health care provider. Follow the directions carefully. Blood pressure medicines must be taken as told by your health care provider. The medicine does not work as well when you skip doses. Skipping doses also puts you at risk for problems. Monitoring Before you monitor your blood pressure: Do not smoke, drink caffeinated beverages, or exercise within 30 minutes before taking a measurement. Use the bathroom and empty your bladder (urinate). Sit quietly for at least 5 minutes before taking measurements. Monitor your blood pressure at home as told by your health care provider. To do this: Sit with your back straight and supported. Place your feet flat on the floor. Do not cross your legs. Support your arm on a flat surface, such as a table. Make sure your upper arm is at heart level. Each time you measure, take two or three readings one minute apart and record the results. You may also need to have your blood pressure checked regularly by your health care provider. General information Talk with your health care provider about your diet, exercise habits, and other lifestyle factors that may be contributing to hypertension. Review all the medicines you take with your health care provider because there may be side effects or  interactions. Keep all follow-up visits. Your health care provider can help you create and adjust your plan for managing your high blood pressure. Where to find more information National Heart, Lung, and Blood Institute: PopSteam.is American Heart Association: www.heart.org Contact a health care provider if: You think you are having a reaction to medicines you have taken. You have repeated (recurrent) headaches. You feel dizzy. You have swelling in your ankles. You have trouble with your vision. Get help right away if: You develop a severe headache or confusion. You have unusual weakness or numbness, or you feel faint. You have severe pain in your chest or abdomen. You vomit repeatedly. You have trouble breathing. These symptoms may be an emergency. Get help right away. Call 911. Do not wait to see if the symptoms will go away. Do not drive yourself to the hospital. Summary Hypertension is when the force of blood pumping through your arteries is too strong. If this condition is not controlled, it may put you at risk for serious complications. Your personal target blood pressure may vary depending on your medical conditions, your age, and other factors. For most people, a normal blood pressure is less than 120/80. Hypertension is managed by lifestyle changes, medicines, or both. Lifestyle changes to help manage hypertension include losing weight, eating a healthy, low-sodium diet, exercising more, stopping smoking, and limiting alcohol. This information is not intended to replace advice given to you by your health care provider. Make sure you discuss any questions you have with your health care provider. Document Revised: 02/19/2021 Document Reviewed: 02/19/2021 Elsevier Patient Education  2024 [employer].

## 2023-02-09 ENCOUNTER — Other Ambulatory Visit: Payer: Self-pay

## 2023-02-09 DIAGNOSIS — Z01818 Encounter for other preprocedural examination: Secondary | ICD-10-CM

## 2023-02-09 NOTE — Telephone Encounter (Signed)
I spoke with Ms Mowry regarding rescheduling her surgery. We discussed the following:   Planned surgery: L3-4 lateral lumbar interbody fusion, removal and replacement of L3-S1 instrumentation with L3-4 fusion    Surgery date: 03/09/23 at [facility] (Medical Mall: [address] [place], [place] [address]) - you will find out your arrival time the business day before your surgery.   Pre-op appointment at [facility] Pre-admit Testing: we will call you with a date/time for this. If you are scheduled for an in person appointment, Pre-admit Testing is located on the first floor of the [facility] building, [address], [address]. Please bring all prescriptions in the original prescription bottles to your appointment, even if you have reviewed medications by phone with a pharmacy representative. During this appointment, they will advise you which medications you can take the morning of surgery, and which medications you will need to hold for surgery. Pre-op labs may be done at your pre-op appointment. You are not required to fast for these labs. Should you need to change your pre-op appointment, please call Pre-admit testing at [phone].     Blood thinners:   Plavix:     Stop Plavix 7 days prior, resume Plavix 14 days after     NSAIDS (Non-steroidal anti-inflammatory drugs): because you are having a fusion, please avoid taking any NSAIDS (examples: ibuprofen, motrin, aleve, naproxen, meloxicam, diclofenac) for 3 months after surgery. Celebrex is an exception and is OK to take, if prescribed. Tylenol is not an NSAID.    Common restrictions after surgery: No bending, lifting, or twisting ("BLT"). Avoid lifting objects heavier than 10 pounds for the first 6 weeks after surgery. Where possible, avoid household activities that involve lifting, bending, reaching, pushing, or pulling such as laundry, vacuuming, grocery shopping, and childcare. Try to  arrange for help from friends and family for these activities while you heal. Do not drive while taking prescription pain medication. Weeks 6 through 12 after surgery: avoid lifting more than 25 pounds.    X-rays after surgery: Because you are having a fusion or arthroplasty: for appointments after your 2 week follow-up: please arrive at the [facility] outpatient imaging center ([address], [place]) or [facility] one hour prior to your appointment for x-rays. This applies to every appointment after your 2 week follow-up. Failure to do so may result in your appointment being rescheduled.   How to contact us:  If you have any questions/concerns before or after surgery, you can reach [place] at [phone], or you can send a mychart message. We can be reached by phone or mychart 8am-4pm, Monday-Friday.  *Please note: Calls after 4pm are forwarded to a third party answering service. Mychart messages are not routinely monitored during evenings, weekends, and holidays. Please call our office to contact the answering service for urgent concerns during non-business hours.    If you have FMLA/disability paperwork, please drop it off or fax it to [phone], attention Patty.   Appointments/FMLA & disability paperwork: Patty & Cristin  Nurse: Royston Cowper  Medical assistants: Laurann Montana, & Lyla Son Physician Assistants: Manning Charity & Drake Leach Surgeons: Venetia Night, MD & Ernestine Mcmurray, MD

## 2023-02-09 NOTE — Addendum Note (Signed)
Addended by: Sharlot Gowda on: 02/09/2023 05:00 PM   Modules accepted: Orders

## 2023-02-17 ENCOUNTER — Encounter: Payer: 59 | Admitting: Orthopedic Surgery

## 2023-02-19 ENCOUNTER — Other Ambulatory Visit: Payer: Self-pay | Admitting: Cardiovascular Disease

## 2023-02-19 ENCOUNTER — Other Ambulatory Visit: Payer: Self-pay | Admitting: Family

## 2023-02-19 ENCOUNTER — Other Ambulatory Visit: Payer: Self-pay | Admitting: Internal Medicine

## 2023-02-19 DIAGNOSIS — J4520 Mild intermittent asthma, uncomplicated: Secondary | ICD-10-CM

## 2023-02-19 DIAGNOSIS — E782 Mixed hyperlipidemia: Secondary | ICD-10-CM

## 2023-02-23 ENCOUNTER — Ambulatory Visit: Payer: Self-pay

## 2023-02-23 NOTE — Patient Instructions (Signed)
Visit Information  Thank you for taking time to visit with me today. Please don't hesitate to contact me if I can be of assistance to you.   Following are the goals we discussed today:   Goals Addressed             This Visit's Progress    Patient Stated:  " I want to manage my arthritis pain and health conditions"       Interventions Today    Flowsheet Row Most Recent Value  Chronic Disease   Chronic disease during today's visit Diabetes, Other  [swollen right finger]  General Interventions   General Interventions Discussed/Reviewed General Interventions Reviewed, Labs, Doctor Visits  [evaluation of current treatment plan for mentioned health conditions and patients adherence to plan as established by provider.  Assessed blood sugar readings, pain level,]  Doctor Visits Discussed/Reviewed Doctor Visits Reviewed  [reviewed upcoming provider visits.  Advised to keep follow up appointment with provider as recommended. Patient advised to reschedule mammogram since scheduled on same day of back surgery]  Education Interventions   Education Provided Provided Education  [discussed upcoming surgery date. Confirmed patient has transport for surgery.]  Provided Verbal Education On Blood Sugar Monitoring  [advised to continue monitoring blood sugars 2 x per day.  Advised to report frequent blood sugars <70 or >200 to provider]  Nutrition Interventions   Nutrition Discussed/Reviewed Nutrition Reviewed, Decreasing sugar intake  [discussed importance of decreasing breads, sugars, potatoes]  Pharmacy Interventions   Pharmacy Dicussed/Reviewed Pharmacy Topics Reviewed  [medications reviewed.  Compliance with medications encouraged. Advised to take medications as prescribed.]              Our next appointment is by telephone on 03/15/23 at 1:30 pm  Please call the care guide team at [phone] if you need to cancel or reschedule your appointment.   If you are experiencing a Mental Health or  Behavioral Health Crisis or need someone to talk to, please call the Suicide and Crisis Lifeline: 988 call 1-800-273-TALK (toll free, 24 hour hotline)  Patient verbalizes understanding of instructions and care plan provided today and agrees to view in MyChart. Active MyChart status and patient understanding of how to access instructions and care plan via MyChart confirmed with patient.     George Ina RN,BSN,CCM [facility] Care Coordination [phone] direct line

## 2023-02-23 NOTE — Patient Outreach (Signed)
  Care Coordination   Follow Up Visit Note   02/23/2023 Name: [name] MRN: [id number] DOB: 29-Dec-1945  [name] is a 77 y.o. year old female who sees Margaretann Loveless, MD for primary care. I spoke with  [name] by phone today.  What matters to the patients health and wellness today?  Patient states her back surgery date was rescheduled to 03/09/23.  She reports back pain level is 7.  Patient reports swelling and pain in right finger still comes and goes.  She states she is not doing anything about this for now. Patient states COVID symptoms have resolved.   Patient states her blood sugars range from 150-200.  She states blood sugars are usually lower in the morning but elevate later in the day.  She states she eats bread daily and is aware that it can effect her blood sugar.     Goals Addressed             This Visit's Progress    Patient Stated:  " I want to manage my arthritis pain and health conditions"       Interventions Today    Flowsheet Row Most Recent Value  Chronic Disease   Chronic disease during today's visit Diabetes, Other  [swollen right finger]  General Interventions   General Interventions Discussed/Reviewed General Interventions Reviewed, Labs, Doctor Visits  [evaluation of current treatment plan for mentioned health conditions and patients adherence to plan as established by provider.  Assessed blood sugar readings, pain level,]  Doctor Visits Discussed/Reviewed Doctor Visits Reviewed  [reviewed upcoming provider visits.  Advised to keep follow up appointment with provider as recommended. Patient advised to reschedule mammogram since scheduled on same day of back surgery]  Education Interventions   Education Provided Provided Education  [discussed upcoming surgery date. Confirmed patient has transport for surgery.]  Provided Verbal Education On Blood Sugar Monitoring  [advised to continue monitoring blood sugars 2 x per day.  Advised to  report frequent blood sugars <70 or >200 to provider]  Nutrition Interventions   Nutrition Discussed/Reviewed Nutrition Reviewed, Decreasing sugar intake  [discussed importance of decreasing breads, sugars, potatoes]  Pharmacy Interventions   Pharmacy Dicussed/Reviewed Pharmacy Topics Reviewed  [medications reviewed.  Compliance with medications encouraged. Advised to take medications as prescribed.]              SDOH assessments and interventions completed:  No     Care Coordination Interventions:  Yes, provided   Follow up plan: Follow up call scheduled for 03/15/23    Encounter Outcome:  Patient Visit Completed   George Ina RN,BSN,CCM [facility] Care Coordination [phone] direct line

## 2023-02-28 ENCOUNTER — Telehealth: Payer: Self-pay

## 2023-03-01 ENCOUNTER — Ambulatory Visit (INDEPENDENT_AMBULATORY_CARE_PROVIDER_SITE_OTHER): Payer: 59 | Admitting: Internal Medicine

## 2023-03-01 ENCOUNTER — Encounter
Admission: RE | Admit: 2023-03-01 | Discharge: 2023-03-01 | Disposition: A | Discharge status: Home / Self Care | Payer: 59 | Source: Ambulatory Visit | Attending: Neurosurgery | Admitting: Neurosurgery

## 2023-03-01 ENCOUNTER — Encounter: Payer: Self-pay | Admitting: Internal Medicine

## 2023-03-01 ENCOUNTER — Ambulatory Visit (INDEPENDENT_AMBULATORY_CARE_PROVIDER_SITE_OTHER): Payer: 59

## 2023-03-01 VITALS — BP 130/84 | HR 77 | Ht 63.0 in | Wt 177.0 lb

## 2023-03-01 DIAGNOSIS — E782 Mixed hyperlipidemia: Secondary | ICD-10-CM | POA: Diagnosis not present

## 2023-03-01 DIAGNOSIS — E1165 Type 2 diabetes mellitus with hyperglycemia: Secondary | ICD-10-CM | POA: Insufficient documentation

## 2023-03-01 DIAGNOSIS — Z91048 Other nonmedicinal substance allergy status: Secondary | ICD-10-CM | POA: Diagnosis not present

## 2023-03-01 DIAGNOSIS — R0602 Shortness of breath: Secondary | ICD-10-CM | POA: Diagnosis not present

## 2023-03-01 DIAGNOSIS — I10 Essential (primary) hypertension: Secondary | ICD-10-CM

## 2023-03-01 HISTORY — DX: Unspecified osteoarthritis, unspecified site: M19.90

## 2023-03-01 HISTORY — DX: Hyperkalemia: E87.5

## 2023-03-01 HISTORY — DX: Solitary pulmonary nodule: R91.1

## 2023-03-01 HISTORY — DX: Peripheral vascular disease, unspecified: I73.9

## 2023-03-01 HISTORY — DX: Shortness of breath: R06.02

## 2023-03-01 LAB — POCT CBG (FASTING - GLUCOSE)-MANUAL ENTRY: Glucose Fasting, POC: 303 mg/dL — AB (ref 70–99)

## 2023-03-01 NOTE — Progress Notes (Signed)
Patient notified

## 2023-03-01 NOTE — Telephone Encounter (Signed)
Appointment scheduled for 03/01/23

## 2023-03-01 NOTE — Telephone Encounter (Signed)
Note from nephrology on 02/08/23 states:  "Hyperkalemia Likely due to coconut water and orange juice. We discussed low potassium diet. Repeat renal panel indicates that potassium level has now improved and is in the normal range. "   Dr Myer Haff is aware of the above.

## 2023-03-01 NOTE — Progress Notes (Signed)
Established Patient Office Visit  Subjective:  Patient ID: [name], female    DOB: Nov 24, 1945  Age: 77 y.o. MRN: [id number]  Chief Complaint  Patient presents with   Shortness of Breath    Patient comes in with complaints of feeling shortness of breath especially when she is at her home and in her bedroom.  Right now she is sitting comfortably and her O2 sats at room air is 97%.  Patient reports that excessive mold has been noticed in her apartment specially in her bedroom area and she has to contact the management for cleaning. She is not having any fevers or chills, no chest pain and no cough. Patient has Zyrtec at home which she needs to take for allergies as well as a Symbicort inhaler. Patient advised to start using her inhaler and allergy medicine regularly and also consider changing her apartment. Meanwhile we will check a chest x-ray. She is not due for blood work, will get it next time.    No other concerns at this time.   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 b.)07/18/2006 -endovas oblit complex ACOM aneur. c.) Endovas Tx of 6.8x43mm RMCA aneur. d.) 3.5x67mm remnant RMCA aneur 2/2 coil compaction. e.) Interval 3.2x3.59mm saccular outpouching in ACOM c/w mild recannulization in neck. 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 g.) 3.7x48mm remnant of prev Tx'd ACOM aneur -failed embol 02/24/2012.   CKD (chronic kidney disease), stage IV (HCC)    a.) solitary functioning kidney on the RIGHT   Complication of anesthesia    a.) delayed emergence   Coronary artery disease 02/23/2007   a.) LHC 02/23/2007 --> EF 50%; 30% pLAD, 70% mRCA --> planned for staged PCI. b.) PCI 02/27/2007: EF 60%; 3.5 x 15 mm Vision BMS to 80% mRCA. b.) LHC 07/24/2007: EF 60%; minor irregs; no occlusive CAD; no intervention. c.) LHC 10/28/2010: EF 60%;  30% mLAD, LCx with minor luminal irregs, 20% ISR mRCA; no intervention. d.) LHC 05/31/2013: EF 60%; 40% mLAD, 30% ISR m-dRCA; no interventions.   DDD (degenerative disc disease), cervical    a.) s/p ACDF C5-C7; hardware in neck; patient appreciates stiffness and issues with mobility   Diastolic dysfunction    a.)  TTE 07/07/2021: EF 87.8%; normal PASP; trace TR/MR; G1DD.   Elevated LEFT hemidiaphragm    Gout    Headache(784.0)    Heart murmur    HLD (hyperlipidemia)    HOH (hard of hearing)    Has hearing aids, doesn't wear   Hyperparathyroidism due to renal insufficiency (HCC)    Hypertension    Incomplete right bundle branch block (RBBB)    Insomnia    a.) takes zolpidem   Long term (current) use of immunomodulator    a.) on DMARD therapy (hydroxychloroquine) for RA/SLE Dx.   Long term current use of antithrombotics/antiplatelets    a.) clopidogrel   Lumbar adjacent segment disease with spondylolisthesis    Multiple acquired cysts of kidney    OSA (obstructive sleep apnea)    a.) does NOT use nocturnal pap therapy   Pancreatic cyst    Pneumonia    Post-COVID chronic cough    RBBB (right bundle branch block)    Rectal bleeding    Rheumatoid arthritis (HCC)    a.) on DMARD; hydroxychloriquine   Shortness of breath 03/01/2023  Systemic lupus erythematosus (HCC)    T2DM (type 2 diabetes mellitus) (HCC)    Tendinitis of left wrist    Thickened endometrium    Wears dentures    partial lower    Past Surgical History:  Procedure Laterality Date   ANEURYSM COILING  08/2013   Attempted embolization of previously treated ACOM aneurysm; unsuccessful N/A 01/28/2012   Location[facility]   BACK SURGERY     BARTHOLIN CYST MARSUPIALIZATION     BICEPT TENODESIS Right 03/02/2022   Procedure: BICEPS TENODESIS;  Surgeon: Christena Flake, MD;  Location: [facility];  Service: Orthopedics;  Laterality: Right;   BREAST CYST ASPIRATION Right 01/25/2012   FNA neg.   BREAST EXCISIONAL  BIOPSY Left 06/26/2007   neg   BUNIONECTOMY Bilateral 2022   CARDIAC CATHETERIZATION Left 02/23/2007   Procedure: CARDIAC CATHETERIZATION; Location: [facility]; Surgeon: Adrian Blackwater, MD   CARDIAC CATHETERIZATION Left 07/31/2007   Procedure: CARDIAC CATHETERIZATION; Location: [facility]; Surgeon: Adrian Blackwater, MD   CARDIAC CATHETERIZATION Left 10/28/2010   Procedure: CARDIAC CATHETERIZATION; Location: [facility]; Surgeon: Adrian Blackwater, MD   CARDIAC CATHETERIZATION Left 05/31/2013   Procedure: CARDIAC CATHETERIZATION; Location: [facility]; Surgeon: Adrian Blackwater, MD   Carotid arteriogram; 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 N/A 07/18/2006   Location: [facility]   CATARACT EXTRACTION     CERVICAL FUSION N/A    Procedure: ACDF C5-C7   COLONOSCOPY WITH PROPOFOL N/A 10/04/2019   Procedure: COLONOSCOPY WITH BIOPSY;  Surgeon: Toney Reil, MD;  Location: [facility];  Service: Endoscopy;  Laterality: N/A;  Diabetic (borderline) - oral meds priority 3   CORONARY STENT INTERVENTION Left 02/27/2007   Procedure: CORONARY STENT INTERVENTION (3.5 x 15 mm Vision BMS to mRCA); Location: [facility]; Surgeon: Lorine Bears, MD   Endovascular near complete obliteration of enlarging neck remnant of previously treated RIGHT MCA aneurysm using stent assisted coiling N/A 09/03/2010   Location: [facility]   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 N/A 10/03/2006   Location[facility]   HYSTEROSCOPY WITH D & C N/A 08/04/2021   Procedure: DILATATION AND CURETTAGE /HYSTEROSCOPY;  Surgeon: Natale Milch, MD;  Location: [facility];  Service: Gynecology;  Laterality: N/A;   POLYPECTOMY N/A 10/04/2019   Procedure: POLYPECTOMY;  Surgeon: Toney Reil, MD;  Location: [facility];  Service: Endoscopy;  Laterality: N/A;   REVERSE SHOULDER ARTHROPLASTY Right 03/02/2022   Procedure: REVERSE SHOULDER ARTHROPLASTY;   Surgeon: Christena Flake, MD;  Location: [facility];  Service: Orthopedics;  Laterality: Right;   UPPER ESOPHAGEAL ENDOSCOPIC ULTRASOUND (EUS) N/A 04/15/2016   Procedure: UPPER ESOPHAGEAL ENDOSCOPIC ULTRASOUND (EUS);  Surgeon: Bearl Mulberry, MD;  Location: [facility];  Service: Gastroenterology;  Laterality: N/A;    Social History   Socioeconomic History   Marital status: Widowed    Spouse name: Not on file   Number of children: Not on file   Years of education: Not on file   Highest education level: Not on file  Occupational History   Not on file  Tobacco Use   Smoking status: Never   Smokeless tobacco: Never  Vaping Use   Vaping status: Never Used  Substance and Sexual Activity   Alcohol use: Yes    Comment: Occasionally at christmas   Drug use: No   Sexual activity: Not Currently    Birth control/protection: Post-menopausal  Other Topics Concern   Not on file  Social History Narrative   Lives  with daughter   Social Determinants of Health   Financial Resource Strain: Low Risk  (02/12/2022)   Overall Financial Resource Strain (CARDIA)    Difficulty of Paying Living Expenses: Not very hard  Food Insecurity: No Food Insecurity (03/02/2022)   Hunger Vital Sign    Worried About Running Out of Food in the Last Year: Never true    Ran Out of Food in the Last Year: Never true  Transportation Needs: No Transportation Needs (03/02/2022)   PRAPARE - Administrator, Civil Service (Medical): No    Lack of Transportation (Non-Medical): No  Physical Activity: Not on file  Stress: No Stress Concern Present (02/26/2022)   [employer] of Occupational Health - Occupational Stress Questionnaire    Feeling of Stress : Not at all  Social Connections: Moderately Isolated (02/26/2022)   Social Connection and Isolation Panel [NHANES]    Frequency of Communication with Friends and Family: More than three times a week    Frequency of Social Gatherings with Friends and  Family: More than three times a week    Attends Religious Services: Never    Database administrator or Organizations: Yes    Attends Banker Meetings: Never    Marital Status: Widowed  Intimate Partner Violence: Not At Risk (03/02/2022)   Humiliation, Afraid, Rape, and Kick questionnaire    Fear of Current or Ex-Partner: No    Emotionally Abused: No    Physically Abused: No    Sexually Abused: No    Family History  Problem Relation Age of Onset   Breast cancer Paternal Aunt 70   Breast cancer Paternal Aunt    Heart disease Mother    Heart disease Father    Stroke Father    Diabetes Sister    Diabetes Brother     Allergies  Allergen Reactions   Isosorbide Dinitrate Other (See Comments)    Collapse   Amlodipine Swelling   Sulfa Antibiotics Itching   Tizanidine Hcl    Oxycodone Itching   Sulfasalazine Itching    Review of Systems  Constitutional: Negative.  Negative for fever, malaise/fatigue and weight loss.  HENT: Negative.  Negative for congestion, ear pain, hearing loss, sinus pain and sore throat.   Eyes: Negative.   Respiratory: Negative.  Negative for cough, hemoptysis, sputum production, shortness of breath and wheezing.   Cardiovascular: Negative.  Negative for chest pain, palpitations and leg swelling.  Gastrointestinal: Negative.  Negative for abdominal pain, constipation, diarrhea, heartburn, nausea and vomiting.  Genitourinary: Negative.  Negative for dysuria and flank pain.  Musculoskeletal: Negative.  Negative for joint pain and myalgias.  Skin: Negative.   Neurological: Negative.  Negative for dizziness and headaches.  Endo/Heme/Allergies: Negative.   Psychiatric/Behavioral: Negative.  Negative for depression and suicidal ideas. The patient is not nervous/anxious.        Objective:   BP 130/84   Pulse 77   Ht 5\' 3"  (1.6 m)   Wt 177 lb (80.3 kg)   SpO2 97%   BMI 31.35 kg/m   Vitals:   03/01/23 1101  BP: 130/84  Pulse: 77   Height: 5\' 3"  (1.6 m)  Weight: 177 lb (80.3 kg)  SpO2: 97%  BMI (Calculated): 31.36    Physical Exam Vitals and nursing note reviewed.  Constitutional:      General: She is not in acute distress.    Appearance: Normal appearance.  HENT:     Head: Normocephalic and atraumatic.  Nose: Nose normal.     Mouth/Throat:     Mouth: Mucous membranes are moist.     Pharynx: Oropharynx is clear.  Eyes:     Conjunctiva/sclera: Conjunctivae normal.     Pupils: Pupils are equal, round, and reactive to light.  Cardiovascular:     Rate and Rhythm: Normal rate and regular rhythm.     Pulses: Normal pulses.     Heart sounds: Murmur heard.  Pulmonary:     Effort: Pulmonary effort is normal.     Breath sounds: Normal breath sounds. No stridor. No wheezing, rhonchi or rales.  Abdominal:     General: Bowel sounds are normal.     Palpations: Abdomen is soft.     Tenderness: There is no abdominal tenderness. There is no right CVA tenderness or left CVA tenderness.  Musculoskeletal:        General: Normal range of motion.     Cervical back: Normal range of motion.     Right lower leg: No edema.     Left lower leg: No edema.  Skin:    General: Skin is warm and dry.  Neurological:     General: No focal deficit present.     Mental Status: She is alert and oriented to person, place, and time.  Psychiatric:        Mood and Affect: Mood normal.        Behavior: Behavior normal.      Results for orders placed or performed in visit on 03/01/23  POCT CBG (Fasting - Glucose)  Result Value Ref Range   Glucose Fasting, POC 303 (A) 70 - 99 mg/dL        Assessment & Plan:  Chest x-ray today.  Continue all medications. Problem List Items Addressed This Visit     Essential hypertension, benign   Mixed hyperlipidemia   Allergy to mold   Shortness of breath - Primary   Relevant Orders   DG Chest 2 View   Type 2 diabetes mellitus with hyperglycemia, without long-term current use of  insulin (HCC)   Relevant Orders   POCT CBG (Fasting - Glucose) (Completed)    Return in about 1 month (around 03/31/2023).   Total time spent: 25 minutes  Margaretann Loveless, MD  03/01/2023   This document may have been prepared by [facility] Voice Recognition software and as such may include unintentional dictation errors.

## 2023-03-01 NOTE — Patient Instructions (Addendum)
Your procedure is scheduled on: Wednesday, September 18 Report to the Registration Desk on the 1st floor of the [facility] To find out your arrival time, please call [phone] between 1PM - 3PM on: Tuesday, September 17 If your arrival time is 6:00 am, do not arrive before that time as the [facility] entrance doors do not open until 6:00 am.  REMEMBER: Instructions that are not followed completely may result in serious medical risk, up to and including death; or upon the discretion of your surgeon and anesthesiologist your surgery may need to be rescheduled.  Do not eat food after midnight the night before surgery.  No gum chewing or hard candies.  You may however, drink water up to 2 hours before you are scheduled to arrive for your surgery. Do not drink anything within 2 hours of your scheduled arrival time.  Continue taking all prescribed medications with the exception of the following:  Stop Plavix 7 days prior, resume Plavix 14 days after   TAKE ONLY THESE MEDICATIONS THE MORNING OF SURGERY WITH A SIP OF WATER:  Hydralazine Hydroxychloroquine Labetalol Rosuvastatin Symbicort inhaler  No Alcohol for 24 hours before or after surgery.  No Smoking including e-cigarettes for 24 hours before surgery.  No chewable tobacco products for at least 6 hours before surgery.  No nicotine patches on the day of surgery.  Do not use any "recreational" drugs for at least a week (preferably 2 weeks) before your surgery.  Please be advised that the combination of cocaine and anesthesia may have negative outcomes, up to and including death. If you test positive for cocaine, your surgery will be cancelled.  On the morning of surgery brush your teeth with toothpaste and water, you may rinse your mouth with mouthwash if you wish. Do not swallow any toothpaste or mouthwash.  Use CHG Soap as directed on instruction sheet.  Do not wear jewelry, make-up, hairpins, clips or nail  polish.  For welded (permanent) jewelry: bracelets, anklets, waist bands, etc.  Please have this removed prior to surgery.  If it is not removed, there is a chance that hospital personnel will need to cut it off on the day of surgery.  Do not wear lotions, powders, or perfumes.   Do not shave body hair from the neck down 48 hours before surgery.  Contact lenses, hearing aids and dentures may not be worn into surgery.  Do not bring valuables to the hospital. [facility] is not responsible for any missing/lost belongings or valuables.   Notify your doctor if there is any change in your medical condition (cold, fever, infection).  Wear comfortable clothing (specific to your surgery type) to the hospital.  After surgery, you can help prevent lung complications by doing breathing exercises.  Take deep breaths and cough every 1-2 hours.   If you are being admitted to the hospital overnight, leave your suitcase in the car. After surgery it may be brought to your room.  In case of increased patient census, it may be necessary for you, the patient, to continue your postoperative care in the Same Day Surgery department.  If you are being discharged the day of surgery, you will not be allowed to drive home. You will need a responsible individual to drive you home and stay with you for 24 hours after surgery.   If you are taking public transportation, you will need to have a responsible individual with you.  Please call the Pre-admissions Testing Dept. at [phone] if  you have any questions about these instructions.  Surgery Visitation Policy:  Patients having surgery or a procedure may have two visitors.  Children under the age of 2 must have an adult with them who is not the patient.  Inpatient Visitation:    Visiting hours are 7 a.m. to 8 p.m. Up to four visitors are allowed at one time in a patient room. The visitors may rotate out with other people during the day.  One visitor  age 39 or older may stay with the patient overnight and must be in the room by 8 p.m.    Pre-operative 5 CHG Bath Instructions   You can play a key role in reducing the risk of infection after surgery. Your skin needs to be as free of germs as possible. You can reduce the number of germs on your skin by washing with CHG (chlorhexidine gluconate) soap before surgery. CHG is an antiseptic soap that kills germs and continues to kill germs even after washing.   DO NOT use if you have an allergy to chlorhexidine/CHG or antibacterial soaps. If your skin becomes reddened or irritated, stop using the CHG and notify one of our RNs at [phone].   Please shower with the CHG soap starting 4 days before surgery using the following schedule:     Please keep in mind the following:  DO NOT shave, including legs and underarms, starting the day of your first shower.   You may shave your face at any point before/day of surgery.  Place clean sheets on your bed the day you start using CHG soap. Use a clean washcloth (not used since being washed) for each shower. DO NOT sleep with pets once you start using the CHG.   CHG Shower Instructions:  If you choose to wash your hair and private area, wash first with your normal shampoo/soap.  After you use shampoo/soap, rinse your hair and body thoroughly to remove shampoo/soap residue.  Turn the water OFF and apply about 3 tablespoons (45 ml) of CHG soap to a CLEAN washcloth.  Apply CHG soap ONLY FROM YOUR NECK DOWN TO YOUR TOES (washing for 3-5 minutes)  DO NOT use CHG soap on face, private areas, open wounds, or sores.  Pay special attention to the area where your surgery is being performed.  If you are having back surgery, having someone wash your back for you may be helpful. Wait 2 minutes after CHG soap is applied, then you may rinse off the CHG soap.  Pat dry with a clean towel  Put on clean clothes/pajamas   If you choose to wear lotion, please use ONLY  the CHG-compatible lotions on the back of this paper.     Additional instructions for the day of surgery: DO NOT APPLY any lotions, deodorants, cologne, or perfumes.   Put on clean/comfortable clothes.  Brush your teeth.  Ask your nurse before applying any prescription medications to the skin.      CHG Compatible Lotions   Aveeno Moisturizing lotion  Cetaphil Moisturizing Cream  Cetaphil Moisturizing Lotion  Clairol Herbal Essence Moisturizing Lotion, Dry Skin  Clairol Herbal Essence Moisturizing Lotion, Extra Dry Skin  Clairol Herbal Essence Moisturizing Lotion, Normal Skin  Curel Age Defying Therapeutic Moisturizing Lotion with Alpha Hydroxy  Curel Extreme Care Body Lotion  Curel Soothing Hands Moisturizing Hand Lotion  Curel Therapeutic Moisturizing Cream, Fragrance-Free  Curel Therapeutic Moisturizing Lotion, Fragrance-Free  Curel Therapeutic Moisturizing Lotion, Original Formula  Eucerin Daily Replenishing Lotion  Eucerin Dry  Skin Therapy Plus Alpha Hydroxy Crme  Eucerin Dry Skin Therapy Plus Alpha Hydroxy Lotion  Eucerin Original Crme  Eucerin Original Lotion  Eucerin Plus Crme Eucerin Plus Lotion  Eucerin TriLipid Replenishing Lotion  Keri Anti-Bacterial Hand Lotion  Keri Deep Conditioning Original Lotion Dry Skin Formula Softly Scented  Keri Deep Conditioning Original Lotion, Fragrance Free Sensitive Skin Formula  Keri Lotion Fast Absorbing Fragrance Free Sensitive Skin Formula  Keri Lotion Fast Absorbing Softly Scented Dry Skin Formula  Keri Original Lotion  Keri Skin Renewal Lotion Keri Silky Smooth Lotion  Keri Silky Smooth Sensitive Skin Lotion  Nivea Body Creamy Conditioning Oil  Nivea Body Extra Enriched Teacher, adult education Moisturizing Lotion Nivea Crme  Nivea Skin Firming Lotion  NutraDerm 30 Skin Lotion  NutraDerm Skin Lotion  NutraDerm Therapeutic Skin Cream  NutraDerm Therapeutic Skin Lotion  ProShield  Protective Hand Cream  Provon moisturizing lotion

## 2023-03-02 ENCOUNTER — Encounter: Payer: Self-pay | Admitting: Urgent Care

## 2023-03-02 ENCOUNTER — Encounter
Admission: RE | Admit: 2023-03-02 | Discharge: 2023-03-02 | Disposition: A | Discharge status: Home / Self Care | Payer: 59 | Source: Ambulatory Visit | Attending: Neurosurgery | Admitting: Neurosurgery

## 2023-03-02 ENCOUNTER — Encounter: Payer: Self-pay | Admitting: Neurosurgery

## 2023-03-02 DIAGNOSIS — R8281 Pyuria: Secondary | ICD-10-CM | POA: Insufficient documentation

## 2023-03-02 DIAGNOSIS — Z01812 Encounter for preprocedural laboratory examination: Secondary | ICD-10-CM | POA: Insufficient documentation

## 2023-03-02 DIAGNOSIS — R8290 Unspecified abnormal findings in urine: Secondary | ICD-10-CM | POA: Diagnosis not present

## 2023-03-02 DIAGNOSIS — R300 Dysuria: Secondary | ICD-10-CM | POA: Diagnosis not present

## 2023-03-02 DIAGNOSIS — E1165 Type 2 diabetes mellitus with hyperglycemia: Secondary | ICD-10-CM | POA: Insufficient documentation

## 2023-03-02 DIAGNOSIS — I10 Essential (primary) hypertension: Secondary | ICD-10-CM | POA: Insufficient documentation

## 2023-03-02 DIAGNOSIS — R8271 Bacteriuria: Secondary | ICD-10-CM | POA: Diagnosis not present

## 2023-03-02 DIAGNOSIS — Z01818 Encounter for other preprocedural examination: Secondary | ICD-10-CM

## 2023-03-02 LAB — CBC
HCT: 32.9 % — ABNORMAL LOW (ref 36.0–46.0)
Hemoglobin: 10.6 g/dL — ABNORMAL LOW (ref 12.0–15.0)
MCH: 29.6 pg (ref 26.0–34.0)
MCHC: 32.2 g/dL (ref 30.0–36.0)
MCV: 91.9 fL (ref 80.0–100.0)
Platelets: 171 10*3/uL (ref 150–400)
RBC: 3.58 MIL/uL — ABNORMAL LOW (ref 3.87–5.11)
RDW: 13.1 % (ref 11.5–15.5)
WBC: 5 10*3/uL (ref 4.0–10.5)
nRBC: 0 % (ref 0.0–0.2)

## 2023-03-02 LAB — BASIC METABOLIC PANEL
Anion gap: 9 (ref 5–15)
BUN: 55 mg/dL — ABNORMAL HIGH (ref 8–23)
CO2: 22 mmol/L (ref 22–32)
Calcium: 9.4 mg/dL (ref 8.9–10.3)
Chloride: 112 mmol/L — ABNORMAL HIGH (ref 98–111)
Creatinine, Ser: 2.62 mg/dL — ABNORMAL HIGH (ref 0.44–1.00)
GFR, Estimated: 18 mL/min — ABNORMAL LOW (ref >60)
Glucose, Bld: 127 mg/dL — ABNORMAL HIGH (ref 70–99)
Potassium: 3.6 mmol/L (ref 3.5–5.1)
Sodium: 143 mmol/L (ref 135–145)

## 2023-03-02 LAB — SURGICAL PCR SCREEN
MRSA, PCR: NEGATIVE
Staphylococcus aureus: NEGATIVE

## 2023-03-02 LAB — TYPE AND SCREEN
ABO/RH(D): O POS
Antibody Screen: NEGATIVE

## 2023-03-02 LAB — URINALYSIS, COMPLETE (UACMP) WITH MICROSCOPIC
Bilirubin Urine: NEGATIVE
Glucose, UA: NEGATIVE mg/dL
Hgb urine dipstick: NEGATIVE
Ketones, ur: NEGATIVE mg/dL
Nitrite: POSITIVE — AB
Protein, ur: 100 mg/dL — AB
Specific Gravity, Urine: 1.013 (ref 1.005–1.030)
pH: 5 (ref 5.0–8.0)

## 2023-03-03 ENCOUNTER — Telehealth: Payer: Self-pay | Admitting: Urgent Care

## 2023-03-03 DIAGNOSIS — B9620 Unspecified Escherichia coli [E. coli] as the cause of diseases classified elsewhere: Secondary | ICD-10-CM

## 2023-03-03 DIAGNOSIS — Z01812 Encounter for preprocedural laboratory examination: Secondary | ICD-10-CM

## 2023-03-03 DIAGNOSIS — Z87440 Personal history of urinary (tract) infections: Secondary | ICD-10-CM

## 2023-03-03 DIAGNOSIS — B3731 Acute candidiasis of vulva and vagina: Secondary | ICD-10-CM

## 2023-03-03 NOTE — Progress Notes (Addendum)
[facility] Perioperative Services: Pre-Admission/Anesthesia Testing  Abnormal Lab Notification and Treatment Plan of Care   Date: 03/04/23  Name: [name] MRN:   [id number]  Re: Abnormal labs noted during PAT appointment   Notified:  Provider Name Provider Role Notification Mode  Venetia Night, MD Neurosurgery Routed and/or faxed via [facility]   Abnormal Lab Value(s):   Lab Results  Component Value Date   COLORURINE YELLOW (A) 03/02/2023   APPEARANCEUR HAZY (A) 03/02/2023   LABSPEC 1.013 03/02/2023   PHURINE 5.0 03/02/2023   GLUCOSEU NEGATIVE 03/02/2023   HGBUR NEGATIVE 03/02/2023   BILIRUBINUR NEGATIVE 03/02/2023   KETONESUR NEGATIVE 03/02/2023   PROTEINUR 100 (A) 03/02/2023   NITRITE POSITIVE (A) 03/02/2023   LEUKOCYTESUR TRACE (A) 03/02/2023   EPIU 0-5 03/02/2023   WBCU 11-20 03/02/2023   RBCU 0-5 03/02/2023   BACTERIA MANY (A) 03/02/2023   CULT >=100,000 COLONIES/mL ESCHERICHIA COLI (A) 03/02/2023    Clinical Information and Notes:  Patient is scheduled for L3-4 LATERAL LUMBAR INTERBODY FUSION; L3-S1 INSTRUMENTATION WITH L3-4 FUSION  on 03/09/2023.    UA performed in PAT consistent with/concerning for infection.  No leukocytosis noted on CBC; WBC 5.0 Renal function: Estimated Creatinine Clearance: 18.3 mL/min (A) (by C-G formula based on SCr of 2.62 mg/dL (H)). Urine C&S added to assess for pathogenically significant growth.  Impression and Plan:  Celinda N Hinger with a UA that was (+) for infection; reflex culture sent. Patient with significant GNR colony count noted: bacteria identified again as Escherichia coli. Contacted patient to discuss. Patient with surgery scheduled soon. In efforts to avoid delaying patient's procedure, or have her experience any potentially significant perioperative complications related to the aforementioned, I would like to proceed with treatment for urinary tract infection.  Allergies reviewed.  Patient was treated with 1st generation (CEPHALEXIN 250 mg TID x 5 days) back on 01/23/2023. Updated urine culture performed, as patient had several resistances noted on previous sample. Report reviewed to ensure culture appropriate coverage being provided. New sample remains resistant to PCN, flouroquinolones, and sulfa drugs (has allergy to sulfa). Given her significantly reduced renal function, nitrofurantoin is not an option.   Will treat with a 5 day course of CEFDINIR. Due to her chronically reduced renal function, dose is being renally adjusted. Patient encouraged to complete the entire course of antibiotics even if she begins to feel better.   Meds ordered this encounter  Medications   cefdinir (OMNICEF) 300 MG capsule    Sig: Take 1 capsule (300 mg total) by mouth daily. Increase water intake while taking this medication.    Dispense:  5 capsule    Refill:  0    Dose reduced due to CrCl of 18.3 mL/min by C-G formula based on SCr of 2.62 mg/dL. Please contact patient once available for pickup. Rx for preop Tx and needs to be started ASAP.   clotrimazole (CLOTRIMAZOLE 3) 2 % vaginal cream    Sig: Place 1 Applicatorful vaginally at bedtime for 3 days. After 3 days of Tx, may apply externally TWICE daily PRN for persistent itching for a total of 7 days of therapy.    Dispense:  21 g    Refill:  0   Patient encouraged to increase her fluid intake as much as possible. Discussed that water is always best to flush the urinary tract. She was advised to avoid caffeine containing fluids until her infections clears, as caffeine can cause her to experience painful bladder spasms.  May use Tylenol as needed for pain/fever should she experience these symptoms.   Patient instructed to call surgeon's office or PAT with any questions or concerns related to the above outlined course of treatment. Additionally, she was instructed to call if she feels like she is getting worse overall while on treatment.  Results and treatment plan of care forwarded to primary attending surgeon to make them aware.   ADDENDUM: 03/04/2023 1005 AM - Patient returned call to the PAT office. She is complaining of significant vaginal itching with no associated discharge. He has a history of recurrent VVC. Treated with systemic fluconazole back in 01/2023. (+) continued itching to the point that patient reporting that her vagina is sore. Itching reported as severe. Will switch approach and treat with azole topical, as this has less renal side effects. Question appropriate system absorption with oral fluconazole despite 150 mg loading doses x 2. Patient to use 2% intravaginal clotrimazole a bedtime x 3 days. If symptoms persist beyond this, she was instructed to use remaining supply topically BID x 4 days for a total of 7 days of clotrimazole therapy. She will let me know if this is not effective and I will make plans to change intervention PRN. Updated note forwarded to surgeon's office.   Encounter Diagnoses  Name Primary?   Pre-operative laboratory examination Yes   E. coli UTI (urinary tract infection)    History of recurrent UTI (urinary tract infection)    Vulvovaginal candidiasis    Quentin Mulling, MSN, APRN, FNP-C, CEN [facility]  Perioperative Services Nurse Practitioner Phone: [phone] Fax: [phone] 03/04/23 10:27 AM  NOTE: This note has been prepared using Dragon dictation software. Despite my best ability to proofread, there is always the potential that unintentional transcriptional errors may still occur from this process.

## 2023-03-04 LAB — URINE CULTURE: Culture: >=100000 — AB

## 2023-03-04 MED ORDER — CEFDINIR 300 MG PO CAPS: 300.0000 mg | ORAL_CAPSULE | Freq: Every day | ORAL | 0 refills | Status: DC

## 2023-03-04 MED ORDER — CLOTRIMAZOLE 3 2 % VA CREA
1.0000 | TOPICAL_CREAM | Freq: Every day | VAGINAL | 0 refills | Status: AC
Start: 2023-03-04 — End: 2023-03-07

## 2023-03-04 NOTE — Addendum Note (Signed)
Addended by: Quentin Mulling on: 03/04/2023 10:28 AM   Modules accepted: Orders

## 2023-03-09 ENCOUNTER — Inpatient Hospital Stay
Admission: RE | Admit: 2023-03-09 | Discharge: 2023-03-11 | DRG: 454 | Disposition: A | Discharge status: Home Health | Payer: 59 | Attending: Neurosurgery | Admitting: Neurosurgery

## 2023-03-09 ENCOUNTER — Encounter
Admission: RE | Disposition: A | Discharge status: Home Health | Payer: Self-pay | Source: Home / Self Care | Attending: Neurosurgery

## 2023-03-09 ENCOUNTER — Other Ambulatory Visit: Payer: Self-pay

## 2023-03-09 ENCOUNTER — Encounter: Payer: Self-pay | Admitting: Neurosurgery

## 2023-03-09 ENCOUNTER — Inpatient Hospital Stay: Payer: 59 | Admitting: Urgent Care

## 2023-03-09 ENCOUNTER — Inpatient Hospital Stay: Payer: 59

## 2023-03-09 DIAGNOSIS — E1165 Type 2 diabetes mellitus with hyperglycemia: Secondary | ICD-10-CM | POA: Diagnosis not present

## 2023-03-09 DIAGNOSIS — Z8249 Family history of ischemic heart disease and other diseases of the circulatory system: Secondary | ICD-10-CM

## 2023-03-09 DIAGNOSIS — Y838 Other surgical procedures as the cause of abnormal reaction of the patient, or of later complication, without mention of misadventure at the time of the procedure: Secondary | ICD-10-CM | POA: Diagnosis not present

## 2023-03-09 DIAGNOSIS — M9669 Fracture of other bone following insertion of orthopedic implant, joint prosthesis, or bone plate: Secondary | ICD-10-CM | POA: Diagnosis not present

## 2023-03-09 DIAGNOSIS — E785 Hyperlipidemia, unspecified: Secondary | ICD-10-CM | POA: Diagnosis not present

## 2023-03-09 DIAGNOSIS — M069 Rheumatoid arthritis, unspecified: Secondary | ICD-10-CM | POA: Diagnosis present

## 2023-03-09 DIAGNOSIS — E1151 Type 2 diabetes mellitus with diabetic peripheral angiopathy without gangrene: Secondary | ICD-10-CM | POA: Diagnosis not present

## 2023-03-09 DIAGNOSIS — I129 Hypertensive chronic kidney disease with stage 1 through stage 4 chronic kidney disease, or unspecified chronic kidney disease: Secondary | ICD-10-CM | POA: Diagnosis not present

## 2023-03-09 DIAGNOSIS — Z01818 Encounter for other preprocedural examination: Secondary | ICD-10-CM

## 2023-03-09 DIAGNOSIS — E1122 Type 2 diabetes mellitus with diabetic chronic kidney disease: Secondary | ICD-10-CM | POA: Diagnosis present

## 2023-03-09 DIAGNOSIS — M5136 Other intervertebral disc degeneration, lumbar region: Secondary | ICD-10-CM | POA: Diagnosis present

## 2023-03-09 DIAGNOSIS — U099 Post covid-19 condition, unspecified: Secondary | ICD-10-CM | POA: Diagnosis not present

## 2023-03-09 DIAGNOSIS — N184 Chronic kidney disease, stage 4 (severe): Secondary | ICD-10-CM | POA: Diagnosis present

## 2023-03-09 DIAGNOSIS — D631 Anemia in chronic kidney disease: Secondary | ICD-10-CM | POA: Diagnosis present

## 2023-03-09 DIAGNOSIS — M109 Gout, unspecified: Secondary | ICD-10-CM | POA: Diagnosis not present

## 2023-03-09 DIAGNOSIS — L7622 Postprocedural hemorrhage and hematoma of skin and subcutaneous tissue following other procedure: Secondary | ICD-10-CM | POA: Diagnosis not present

## 2023-03-09 DIAGNOSIS — Z823 Family history of stroke: Secondary | ICD-10-CM

## 2023-03-09 DIAGNOSIS — Z01812 Encounter for preprocedural laboratory examination: Principal | ICD-10-CM

## 2023-03-09 DIAGNOSIS — Z7984 Long term (current) use of oral hypoglycemic drugs: Secondary | ICD-10-CM

## 2023-03-09 DIAGNOSIS — Z888 Allergy status to other drugs, medicaments and biological substances status: Secondary | ICD-10-CM

## 2023-03-09 DIAGNOSIS — Z885 Allergy status to narcotic agent status: Secondary | ICD-10-CM | POA: Diagnosis not present

## 2023-03-09 DIAGNOSIS — Z955 Presence of coronary angioplasty implant and graft: Secondary | ICD-10-CM

## 2023-03-09 DIAGNOSIS — T380X5A Adverse effect of glucocorticoids and synthetic analogues, initial encounter: Secondary | ICD-10-CM | POA: Diagnosis not present

## 2023-03-09 DIAGNOSIS — M4316 Spondylolisthesis, lumbar region: Principal | ICD-10-CM

## 2023-03-09 DIAGNOSIS — M48061 Spinal stenosis, lumbar region without neurogenic claudication: Secondary | ICD-10-CM | POA: Diagnosis not present

## 2023-03-09 DIAGNOSIS — Z833 Family history of diabetes mellitus: Secondary | ICD-10-CM

## 2023-03-09 DIAGNOSIS — R053 Chronic cough: Secondary | ICD-10-CM | POA: Diagnosis not present

## 2023-03-09 DIAGNOSIS — I671 Cerebral aneurysm, nonruptured: Secondary | ICD-10-CM | POA: Diagnosis not present

## 2023-03-09 DIAGNOSIS — Z981 Arthrodesis status: Secondary | ICD-10-CM

## 2023-03-09 DIAGNOSIS — Z7409 Other reduced mobility: Secondary | ICD-10-CM | POA: Diagnosis not present

## 2023-03-09 DIAGNOSIS — M329 Systemic lupus erythematosus, unspecified: Secondary | ICD-10-CM | POA: Diagnosis present

## 2023-03-09 DIAGNOSIS — I10 Essential (primary) hypertension: Secondary | ICD-10-CM

## 2023-03-09 DIAGNOSIS — Z803 Family history of malignant neoplasm of breast: Secondary | ICD-10-CM

## 2023-03-09 DIAGNOSIS — N2581 Secondary hyperparathyroidism of renal origin: Secondary | ICD-10-CM | POA: Diagnosis present

## 2023-03-09 DIAGNOSIS — I2510 Atherosclerotic heart disease of native coronary artery without angina pectoris: Secondary | ICD-10-CM | POA: Diagnosis present

## 2023-03-09 DIAGNOSIS — Z7951 Long term (current) use of inhaled steroids: Secondary | ICD-10-CM

## 2023-03-09 DIAGNOSIS — Z882 Allergy status to sulfonamides status: Secondary | ICD-10-CM

## 2023-03-09 DIAGNOSIS — M47816 Spondylosis without myelopathy or radiculopathy, lumbar region: Secondary | ICD-10-CM | POA: Diagnosis not present

## 2023-03-09 DIAGNOSIS — Z7902 Long term (current) use of antithrombotics/antiplatelets: Secondary | ICD-10-CM

## 2023-03-09 DIAGNOSIS — Z79899 Other long term (current) drug therapy: Secondary | ICD-10-CM

## 2023-03-09 HISTORY — DX: Long term (current) use of anticoagulants: Z79.01

## 2023-03-09 HISTORY — PX: APPLICATION OF INTRAOPERATIVE CT SCAN: SHX6668

## 2023-03-09 HISTORY — PX: ANTERIOR LATERAL LUMBAR FUSION WITH PERCUTANEOUS SCREW 1 LEVEL: SHX5553

## 2023-03-09 LAB — POCT I-STAT, CHEM 8
BUN: 46 mg/dL — ABNORMAL HIGH (ref 8–23)
Calcium, Ion: 1.24 mmol/L (ref 1.15–1.40)
Chloride: 114 mmol/L — ABNORMAL HIGH (ref 98–111)
Creatinine, Ser: 2.8 mg/dL — ABNORMAL HIGH (ref 0.44–1.00)
Glucose, Bld: 108 mg/dL — ABNORMAL HIGH (ref 70–99)
HCT: 31 % — ABNORMAL LOW (ref 36.0–46.0)
Hemoglobin: 10.5 g/dL — ABNORMAL LOW (ref 12.0–15.0)
Potassium: 3.7 mmol/L (ref 3.5–5.1)
Sodium: 147 mmol/L — ABNORMAL HIGH (ref 135–145)
TCO2: 20 mmol/L — ABNORMAL LOW (ref 22–32)

## 2023-03-09 LAB — GLUCOSE, CAPILLARY
Glucose-Capillary: 100 mg/dL — ABNORMAL HIGH (ref 70–99)
Glucose-Capillary: 234 mg/dL — ABNORMAL HIGH (ref 70–99)
Glucose-Capillary: 233 mg/dL — ABNORMAL HIGH (ref 70–99)
Glucose-Capillary: 266 mg/dL — ABNORMAL HIGH (ref 70–99)

## 2023-03-09 SURGERY — ANTERIOR LATERAL LUMBAR FUSION WITH PERCUTANEOUS SCREW 1 LEVEL
Anesthesia: General | Site: Spine Lumbar

## 2023-03-09 MED ORDER — LABETALOL HCL 200 MG PO TABS: 300.0000 mg | ORAL_TABLET | Freq: Two times a day (BID) | ORAL | Status: DC

## 2023-03-09 MED ORDER — SENNA 8.6 MG PO TABS
1.0000 | ORAL_TABLET | Freq: Two times a day (BID) | ORAL | Status: DC
  Administered 2023-03-09 – 2023-03-11 (×4): 8.6 mg via ORAL

## 2023-03-09 MED ORDER — ONDANSETRON HCL 4 MG/2ML IJ SOLN: 4.0000 mg | Freq: Once | INTRAMUSCULAR | Status: DC | PRN

## 2023-03-09 MED ORDER — FUROSEMIDE 20 MG PO TABS
40.0000 mg | ORAL_TABLET | Freq: Every day | ORAL | Status: DC
  Administered 2023-03-09 – 2023-03-10 (×2): 40 mg via ORAL

## 2023-03-09 MED ORDER — VANCOMYCIN HCL IN DEXTROSE 1-5 GM/200ML-% IV SOLN
1000.0000 mg | Freq: Once | INTRAVENOUS | Status: AC
  Administered 2023-03-09: 1000 mg via INTRAVENOUS

## 2023-03-09 MED ORDER — SODIUM CHLORIDE 0.9 % IV SOLN: 250.0000 mL | INTRAVENOUS | Status: DC

## 2023-03-09 MED ORDER — INSULIN ASPART 100 UNIT/ML IJ SOLN
INTRAMUSCULAR | Status: AC
  Filled 2023-03-09: qty 1

## 2023-03-09 MED ORDER — PHENYLEPHRINE HCL-NACL 20-0.9 MG/250ML-% IV SOLN
INTRAVENOUS | Status: DC | PRN
Start: 2023-03-09 — End: 2023-03-09
  Administered 2023-03-09: 30 ug/min via INTRAVENOUS

## 2023-03-09 MED ORDER — VASOPRESSIN 20 UNIT/ML IV SOLN
INTRAVENOUS | Status: DC | PRN
Start: 2023-03-09 — End: 2023-03-09
  Administered 2023-03-09 (×2): 2 [IU] via INTRAVENOUS
  Administered 2023-03-09: 1 [IU] via INTRAVENOUS
  Administered 2023-03-09: 2 [IU] via INTRAVENOUS
  Administered 2023-03-09: 1 [IU] via INTRAVENOUS

## 2023-03-09 MED ORDER — ACETAMINOPHEN 10 MG/ML IV SOLN: 1000.0000 mg | Freq: Once | INTRAVENOUS | Status: DC | PRN

## 2023-03-09 MED ORDER — PROPOFOL 10 MG/ML IV BOLUS
INTRAVENOUS | Status: DC | PRN
  Administered 2023-03-09: 80 mg via INTRAVENOUS

## 2023-03-09 MED ORDER — LOSARTAN POTASSIUM 50 MG PO TABS
100.0000 mg | ORAL_TABLET | ORAL | Status: DC
  Administered 2023-03-10: 100 mg via ORAL

## 2023-03-09 MED ORDER — LIDOCAINE HCL (PF) 2 % IJ SOLN
INTRAMUSCULAR | Status: AC
  Filled 2023-03-09: qty 5

## 2023-03-09 MED ORDER — SENNA 8.6 MG PO TABS
ORAL_TABLET | ORAL | Status: AC
  Filled 2023-03-09: qty 1

## 2023-03-09 MED ORDER — INSULIN ASPART 100 UNIT/ML IJ SOLN
6.0000 [IU] | Freq: Once | INTRAMUSCULAR | Status: AC
  Administered 2023-03-09: 6 [IU] via SUBCUTANEOUS

## 2023-03-09 MED ORDER — ACETAMINOPHEN 10 MG/ML IV SOLN
INTRAVENOUS | Status: AC
  Filled 2023-03-09: qty 100

## 2023-03-09 MED ORDER — KETAMINE HCL 50 MG/5ML IJ SOSY
PREFILLED_SYRINGE | INTRAMUSCULAR | Status: AC
  Filled 2023-03-09: qty 5

## 2023-03-09 MED ORDER — GABAPENTIN 300 MG PO CAPS
300.0000 mg | ORAL_CAPSULE | Freq: Every day | ORAL | Status: DC
  Administered 2023-03-09 – 2023-03-10 (×2): 300 mg via ORAL

## 2023-03-09 MED ORDER — HYDROMORPHONE HCL 1 MG/ML IJ SOLN
INTRAMUSCULAR | Status: AC
  Filled 2023-03-09: qty 1

## 2023-03-09 MED ORDER — ZOLPIDEM TARTRATE 5 MG PO TABS
5.0000 mg | ORAL_TABLET | Freq: Every evening | ORAL | Status: DC | PRN
  Administered 2023-03-10: 5 mg via ORAL

## 2023-03-09 MED ORDER — SODIUM CHLORIDE 0.9% FLUSH
3.0000 mL | Freq: Two times a day (BID) | INTRAVENOUS | Status: DC
  Administered 2023-03-09 – 2023-03-11 (×3): 3 mL via INTRAVENOUS

## 2023-03-09 MED ORDER — HYDRALAZINE HCL 50 MG PO TABS: 100.0000 mg | ORAL_TABLET | Freq: Two times a day (BID) | ORAL | Status: DC

## 2023-03-09 MED ORDER — ACETAMINOPHEN 10 MG/ML IV SOLN
INTRAVENOUS | Status: DC | PRN
  Administered 2023-03-09: 1000 mg via INTRAVENOUS

## 2023-03-09 MED ORDER — ROSUVASTATIN CALCIUM 20 MG PO TABS
40.0000 mg | ORAL_TABLET | Freq: Every day | ORAL | Status: DC
  Administered 2023-03-10: 40 mg via ORAL
  Filled 2023-03-09: qty 2

## 2023-03-09 MED ORDER — CEFAZOLIN SODIUM-DEXTROSE 2-4 GM/100ML-% IV SOLN
2.0000 g | Freq: Once | INTRAVENOUS | Status: AC
  Administered 2023-03-09: 2 g via INTRAVENOUS

## 2023-03-09 MED ORDER — METHOCARBAMOL 500 MG PO TABS
500.0000 mg | ORAL_TABLET | Freq: Four times a day (QID) | ORAL | Status: DC | PRN
  Administered 2023-03-09 – 2023-03-11 (×2): 500 mg via ORAL

## 2023-03-09 MED ORDER — ONDANSETRON HCL 4 MG/2ML IJ SOLN
INTRAMUSCULAR | Status: DC | PRN
  Administered 2023-03-09: 4 mg via INTRAVENOUS

## 2023-03-09 MED ORDER — ACETAMINOPHEN 500 MG PO TABS
ORAL_TABLET | ORAL | Status: AC
  Filled 2023-03-09: qty 2

## 2023-03-09 MED ORDER — HYDROXYCHLOROQUINE SULFATE 200 MG PO TABS
200.0000 mg | ORAL_TABLET | Freq: Every day | ORAL | Status: DC
  Administered 2023-03-10 – 2023-03-11 (×2): 200 mg via ORAL
  Filled 2023-03-09 (×3): qty 1

## 2023-03-09 MED ORDER — ACETAMINOPHEN 325 MG PO TABS: 650.0000 mg | ORAL_TABLET | ORAL | Status: DC | PRN

## 2023-03-09 MED ORDER — SODIUM CHLORIDE FLUSH 0.9 % IV SOLN
INTRAVENOUS | Status: AC
  Filled 2023-03-09: qty 20

## 2023-03-09 MED ORDER — DEXMEDETOMIDINE HCL IN NACL 80 MCG/20ML IV SOLN
INTRAVENOUS | Status: DC | PRN
  Administered 2023-03-09: 12 ug via INTRAVENOUS
  Administered 2023-03-09: 8 ug via INTRAVENOUS

## 2023-03-09 MED ORDER — ALLOPURINOL 100 MG PO TABS
100.0000 mg | ORAL_TABLET | Freq: Every day | ORAL | Status: DC
  Administered 2023-03-10: 100 mg via ORAL
  Filled 2023-03-09 (×3): qty 1

## 2023-03-09 MED ORDER — ONDANSETRON HCL 4 MG/2ML IJ SOLN: 4.0000 mg | Freq: Four times a day (QID) | INTRAMUSCULAR | Status: DC | PRN

## 2023-03-09 MED ORDER — FENTANYL CITRATE (PF) 100 MCG/2ML IJ SOLN
INTRAMUSCULAR | Status: AC
  Filled 2023-03-09: qty 2

## 2023-03-09 MED ORDER — PROPOFOL 10 MG/ML IV BOLUS
INTRAVENOUS | Status: AC
  Filled 2023-03-09: qty 20

## 2023-03-09 MED ORDER — METHOCARBAMOL 1000 MG/10ML IJ SOLN
500.0000 mg | Freq: Four times a day (QID) | INTRAVENOUS | Status: DC | PRN
  Administered 2023-03-09: 500 mg via INTRAVENOUS
  Filled 2023-03-09: qty 500

## 2023-03-09 MED ORDER — ONDANSETRON HCL 4 MG/2ML IJ SOLN
INTRAMUSCULAR | Status: AC
  Filled 2023-03-09: qty 2

## 2023-03-09 MED ORDER — SODIUM CHLORIDE (PF) 0.9 % IJ SOLN
INTRAMUSCULAR | Status: DC | PRN
  Administered 2023-03-09: 60 mL via INTRAMUSCULAR

## 2023-03-09 MED ORDER — EPHEDRINE SULFATE (PRESSORS) 50 MG/ML IJ SOLN
INTRAMUSCULAR | Status: DC | PRN
  Administered 2023-03-09: 10 mg via INTRAVENOUS

## 2023-03-09 MED ORDER — SODIUM CHLORIDE 0.9 % IV SOLN: INTRAVENOUS | Status: DC

## 2023-03-09 MED ORDER — PHENOL 1.4 % MT LIQD: 1.0000 | OROMUCOSAL | Status: DC | PRN

## 2023-03-09 MED ORDER — HYDROMORPHONE HCL 1 MG/ML IJ SOLN
0.5000 mg | INTRAMUSCULAR | Status: AC | PRN
  Administered 2023-03-09 (×4): 0.5 mg via INTRAVENOUS

## 2023-03-09 MED ORDER — ACETAMINOPHEN 500 MG PO TABS
1000.0000 mg | ORAL_TABLET | Freq: Three times a day (TID) | ORAL | Status: DC
  Administered 2023-03-09 – 2023-03-11 (×4): 1000 mg via ORAL

## 2023-03-09 MED ORDER — FENTANYL CITRATE (PF) 100 MCG/2ML IJ SOLN
25.0000 ug | INTRAMUSCULAR | Status: DC | PRN
  Administered 2023-03-09 (×4): 25 ug via INTRAVENOUS

## 2023-03-09 MED ORDER — SUCCINYLCHOLINE CHLORIDE 200 MG/10ML IV SOSY
PREFILLED_SYRINGE | INTRAVENOUS | Status: AC
  Filled 2023-03-09: qty 10

## 2023-03-09 MED ORDER — SURGIFLO WITH THROMBIN (HEMOSTATIC MATRIX KIT) OPTIME
TOPICAL | Status: DC | PRN
  Administered 2023-03-09: 1 via TOPICAL

## 2023-03-09 MED ORDER — CHLORHEXIDINE GLUCONATE 0.12 % MT SOLN
OROMUCOSAL | Status: AC
  Filled 2023-03-09: qty 15

## 2023-03-09 MED ORDER — FENTANYL CITRATE (PF) 100 MCG/2ML IJ SOLN
INTRAMUSCULAR | Status: DC | PRN
  Administered 2023-03-09 (×2): 50 ug via INTRAVENOUS

## 2023-03-09 MED ORDER — SODIUM CHLORIDE 0.9 % IV SOLN
INTRAVENOUS | Status: DC | PRN
  Administered 2023-03-09: .1 ug/kg/min via INTRAVENOUS

## 2023-03-09 MED ORDER — BUPIVACAINE-EPINEPHRINE (PF) 0.5% -1:200000 IJ SOLN
INTRAMUSCULAR | Status: AC
  Filled 2023-03-09: qty 30

## 2023-03-09 MED ORDER — ACETAMINOPHEN 650 MG RE SUPP: 650.0000 mg | RECTAL | Status: DC | PRN

## 2023-03-09 MED ORDER — CEFAZOLIN SODIUM-DEXTROSE 2-4 GM/100ML-% IV SOLN
INTRAVENOUS | Status: AC
  Filled 2023-03-09: qty 100

## 2023-03-09 MED ORDER — POLYETHYLENE GLYCOL 3350 17 G PO PACK: 17.0000 g | PACK | Freq: Every day | ORAL | Status: DC | PRN

## 2023-03-09 MED ORDER — DEXAMETHASONE SODIUM PHOSPHATE 10 MG/ML IJ SOLN
INTRAMUSCULAR | Status: DC | PRN
  Administered 2023-03-09: 10 mg via INTRAVENOUS

## 2023-03-09 MED ORDER — HYDRALAZINE HCL 50 MG PO TABS
ORAL_TABLET | ORAL | Status: AC
  Filled 2023-03-09: qty 2

## 2023-03-09 MED ORDER — ROSUVASTATIN CALCIUM 20 MG PO TABS
ORAL_TABLET | ORAL | Status: AC
  Filled 2023-03-09: qty 2

## 2023-03-09 MED ORDER — CEFDINIR 300 MG PO CAPS: 300.0000 mg | ORAL_CAPSULE | Freq: Every day | ORAL | Status: DC

## 2023-03-09 MED ORDER — INSULIN ASPART 100 UNIT/ML IJ SOLN
0.0000 [IU] | Freq: Every day | INTRAMUSCULAR | Status: DC
  Administered 2023-03-09: 3 [IU] via SUBCUTANEOUS

## 2023-03-09 MED ORDER — IRRISEPT - 450ML BOTTLE WITH 0.05% CHG IN STERILE WATER, USP 99.95% OPTIME
TOPICAL | Status: DC | PRN
Start: 2023-03-09 — End: 2023-03-09
  Administered 2023-03-09: 450 mL

## 2023-03-09 MED ORDER — FUROSEMIDE 20 MG PO TABS
ORAL_TABLET | ORAL | Status: AC
  Filled 2023-03-09: qty 2

## 2023-03-09 MED ORDER — GLYCOPYRROLATE 0.2 MG/ML IJ SOLN
INTRAMUSCULAR | Status: AC
  Filled 2023-03-09: qty 1

## 2023-03-09 MED ORDER — HEPARIN SODIUM (PORCINE) 5000 UNIT/ML IJ SOLN
5000.0000 [IU] | Freq: Three times a day (TID) | INTRAMUSCULAR | Status: DC
  Administered 2023-03-09: 5000 [IU] via SUBCUTANEOUS

## 2023-03-09 MED ORDER — GLIMEPIRIDE 1 MG PO TABS
2.0000 mg | ORAL_TABLET | Freq: Every day | ORAL | Status: DC
  Administered 2023-03-10 – 2023-03-11 (×2): 2 mg via ORAL

## 2023-03-09 MED ORDER — PHENYLEPHRINE HCL-NACL 20-0.9 MG/250ML-% IV SOLN
INTRAVENOUS | Status: AC
  Filled 2023-03-09: qty 250

## 2023-03-09 MED ORDER — BUPIVACAINE HCL (PF) 0.5 % IJ SOLN
INTRAMUSCULAR | Status: AC
  Filled 2023-03-09: qty 30

## 2023-03-09 MED ORDER — 0.9 % SODIUM CHLORIDE (POUR BTL) OPTIME
TOPICAL | Status: DC | PRN
  Administered 2023-03-09: 500 mL

## 2023-03-09 MED ORDER — PROPOFOL 10 MG/ML IV BOLUS
INTRAVENOUS | Status: AC
  Filled 2023-03-09: qty 40

## 2023-03-09 MED ORDER — GABAPENTIN 300 MG PO CAPS
ORAL_CAPSULE | ORAL | Status: AC
  Filled 2023-03-09: qty 1

## 2023-03-09 MED ORDER — HEPARIN SODIUM (PORCINE) 5000 UNIT/ML IJ SOLN
5000.0000 [IU] | Freq: Three times a day (TID) | INTRAMUSCULAR | Status: DC
  Administered 2023-03-10 – 2023-03-11 (×4): 5000 [IU] via SUBCUTANEOUS

## 2023-03-09 MED ORDER — DOCUSATE SODIUM 100 MG PO CAPS
ORAL_CAPSULE | ORAL | Status: AC
  Filled 2023-03-09: qty 1

## 2023-03-09 MED ORDER — REMIFENTANIL HCL 1 MG IV SOLR
INTRAVENOUS | Status: AC
  Filled 2023-03-09: qty 1000

## 2023-03-09 MED ORDER — GLYCOPYRROLATE 0.2 MG/ML IJ SOLN
INTRAMUSCULAR | Status: DC | PRN
  Administered 2023-03-09: .2 mg via INTRAVENOUS

## 2023-03-09 MED ORDER — DEXMEDETOMIDINE HCL IN NACL 80 MCG/20ML IV SOLN
INTRAVENOUS | Status: AC
  Filled 2023-03-09: qty 20

## 2023-03-09 MED ORDER — FAMOTIDINE 20 MG PO TABS
20.0000 mg | ORAL_TABLET | Freq: Once | ORAL | Status: AC
  Administered 2023-03-09: 20 mg via ORAL

## 2023-03-09 MED ORDER — MAGNESIUM CITRATE PO SOLN: 1.0000 | Freq: Once | ORAL | Status: DC | PRN

## 2023-03-09 MED ORDER — LABETALOL HCL 200 MG PO TABS
300.0000 mg | ORAL_TABLET | Freq: Two times a day (BID) | ORAL | Status: DC
  Administered 2023-03-09 – 2023-03-10 (×3): 300 mg via ORAL
  Filled 2023-03-09 (×4): qty 1

## 2023-03-09 MED ORDER — METHOCARBAMOL 500 MG PO TABS
ORAL_TABLET | ORAL | Status: AC
  Filled 2023-03-09: qty 1

## 2023-03-09 MED ORDER — FAMOTIDINE 20 MG PO TABS
ORAL_TABLET | ORAL | Status: AC
  Filled 2023-03-09: qty 1

## 2023-03-09 MED ORDER — MENTHOL 3 MG MT LOZG
1.0000 | LOZENGE | OROMUCOSAL | Status: DC | PRN
  Administered 2023-03-09: 3 mg via ORAL
  Filled 2023-03-09: qty 9

## 2023-03-09 MED ORDER — HEPARIN SODIUM (PORCINE) 5000 UNIT/ML IJ SOLN
INTRAMUSCULAR | Status: AC
  Filled 2023-03-09: qty 1

## 2023-03-09 MED ORDER — DOCUSATE SODIUM 100 MG PO CAPS
100.0000 mg | ORAL_CAPSULE | Freq: Two times a day (BID) | ORAL | Status: DC
  Administered 2023-03-09 – 2023-03-11 (×4): 100 mg via ORAL

## 2023-03-09 MED ORDER — ORAL CARE MOUTH RINSE: 15.0000 mL | Freq: Once | OROMUCOSAL | Status: AC

## 2023-03-09 MED ORDER — INSULIN ASPART 100 UNIT/ML IJ SOLN
0.0000 [IU] | Freq: Three times a day (TID) | INTRAMUSCULAR | Status: DC
  Administered 2023-03-10: 2 [IU] via SUBCUTANEOUS
  Administered 2023-03-10: 3 [IU] via SUBCUTANEOUS
  Administered 2023-03-11: 5 [IU] via SUBCUTANEOUS

## 2023-03-09 MED ORDER — HYDROCODONE-ACETAMINOPHEN 5-325 MG PO TABS
ORAL_TABLET | ORAL | Status: AC
  Filled 2023-03-09: qty 2

## 2023-03-09 MED ORDER — SODIUM CHLORIDE 0.9% FLUSH: 3.0000 mL | INTRAVENOUS | Status: DC | PRN

## 2023-03-09 MED ORDER — VANCOMYCIN HCL IN DEXTROSE 1-5 GM/200ML-% IV SOLN
INTRAVENOUS | Status: AC
  Filled 2023-03-09: qty 200

## 2023-03-09 MED ORDER — KETAMINE HCL 10 MG/ML IJ SOLN
INTRAMUSCULAR | Status: DC | PRN
  Administered 2023-03-09: 30 mg via INTRAVENOUS
  Administered 2023-03-09: 20 mg via INTRAVENOUS

## 2023-03-09 MED ORDER — HYDRALAZINE HCL 50 MG PO TABS
100.0000 mg | ORAL_TABLET | Freq: Two times a day (BID) | ORAL | Status: DC
  Administered 2023-03-09 – 2023-03-10 (×2): 100 mg via ORAL
  Administered 2023-03-10: 50 mg via ORAL

## 2023-03-09 MED ORDER — EPHEDRINE 5 MG/ML INJ
INTRAVENOUS | Status: AC
  Filled 2023-03-09: qty 5

## 2023-03-09 MED ORDER — ONDANSETRON HCL 4 MG PO TABS: 4.0000 mg | ORAL_TABLET | Freq: Four times a day (QID) | ORAL | Status: DC | PRN

## 2023-03-09 MED ORDER — HYDROCODONE-ACETAMINOPHEN 5-325 MG PO TABS: 1.0000 | ORAL_TABLET | ORAL | Status: DC | PRN

## 2023-03-09 MED ORDER — MORPHINE SULFATE (PF) 2 MG/ML IV SOLN: 2.0000 mg | INTRAVENOUS | Status: AC | PRN

## 2023-03-09 MED ORDER — BISACODYL 10 MG RE SUPP: 10.0000 mg | Freq: Every day | RECTAL | Status: DC | PRN

## 2023-03-09 MED ORDER — BUPIVACAINE-EPINEPHRINE 0.5% -1:200000 IJ SOLN
INTRAMUSCULAR | Status: DC | PRN
  Administered 2023-03-09: 6 mL
  Administered 2023-03-09: 10 mL

## 2023-03-09 MED ORDER — HYDROCODONE-ACETAMINOPHEN 5-325 MG PO TABS
2.0000 | ORAL_TABLET | ORAL | Status: DC | PRN
  Administered 2023-03-10 – 2023-03-11 (×2): 2 via ORAL

## 2023-03-09 MED ORDER — BUPIVACAINE LIPOSOME 1.3 % IJ SUSP
INTRAMUSCULAR | Status: AC
  Filled 2023-03-09: qty 20

## 2023-03-09 MED ORDER — LIDOCAINE HCL (CARDIAC) PF 100 MG/5ML IV SOSY
PREFILLED_SYRINGE | INTRAVENOUS | Status: DC | PRN
  Administered 2023-03-09: 80 mg via INTRAVENOUS

## 2023-03-09 MED ORDER — MOMETASONE FURO-FORMOTEROL FUM 100-5 MCG/ACT IN AERO
2.0000 | INHALATION_SPRAY | Freq: Two times a day (BID) | RESPIRATORY_TRACT | Status: DC
  Filled 2023-03-09 (×2): qty 8.8

## 2023-03-09 MED ORDER — SUCCINYLCHOLINE CHLORIDE 200 MG/10ML IV SOSY
PREFILLED_SYRINGE | INTRAVENOUS | Status: DC | PRN
  Administered 2023-03-09: 80 mg via INTRAVENOUS

## 2023-03-09 MED ORDER — CHLORHEXIDINE GLUCONATE 0.12 % MT SOLN
15.0000 mL | Freq: Once | OROMUCOSAL | Status: AC
  Administered 2023-03-09: 15 mL via OROMUCOSAL

## 2023-03-09 SURGICAL SUPPLY — 90 items
ADH SKN CLS APL DERMABOND .7 (GAUZE/BANDAGES/DRESSINGS) ×3
AGENT HMST KT MTR STRL THRMB (HEMOSTASIS) ×1
ALLOGRAFT BONESTRIP KORE 2.5X5 (Bone Implant) IMPLANT
APL PRP STRL LF DISP 70% ISPRP (MISCELLANEOUS) ×2
BASIN KIT SINGLE STR (MISCELLANEOUS) ×2 IMPLANT
BUR NEURO DRILL SOFT 3.0X3.8M (BURR) ×2 IMPLANT
CHLORAPREP W/TINT 26 (MISCELLANEOUS) ×4 IMPLANT
CORD BIP STRL DISP 12FT (MISCELLANEOUS) ×2 IMPLANT
CORD LIGHT LATERIAL X LIFT (MISCELLANEOUS) IMPLANT
COVERAGE SUPPORT SPINE BRAINLB (MISCELLANEOUS)
DERMABOND ADVANCED .7 DNX12 (GAUZE/BANDAGES/DRESSINGS) ×6 IMPLANT
DRAPE C ARM PK CFD 31 SPINE (DRAPES) ×2 IMPLANT
DRAPE C-ARMOR (DRAPES) IMPLANT
DRAPE HD 5FT BACK TABLE (DRAPES) ×2 IMPLANT
DRAPE INCISE IOBAN 66X45 STRL (DRAPES) ×2 IMPLANT
DRAPE LAPAROTOMY 100X77 ABD (DRAPES) ×4 IMPLANT
DRAPE POUCH INSTRU U-SHP 10X18 (DRAPES) ×2 IMPLANT
DRAPE SCAN PATIENT (DRAPES) ×2 IMPLANT
DRAPE TABLE BACK 80X90 (DRAPES) ×2 IMPLANT
DRSG OPSITE POSTOP 3X4 (GAUZE/BANDAGES/DRESSINGS) IMPLANT
DRSG OPSITE POSTOP 4X6 (GAUZE/BANDAGES/DRESSINGS) IMPLANT
DRSG OPSITE POSTOP 4X8 (GAUZE/BANDAGES/DRESSINGS) IMPLANT
DRSG TEGADERM 2-3/8X2-3/4 SM (GAUZE/BANDAGES/DRESSINGS) IMPLANT
DRSG TEGADERM 4X4.75 (GAUZE/BANDAGES/DRESSINGS) IMPLANT
DRSG TEGADERM 6X8 (GAUZE/BANDAGES/DRESSINGS) IMPLANT
ELECT REM PT RETURN 9FT ADLT (ELECTROSURGICAL) ×2
ELECTRODE REM PT RTRN 9FT ADLT (ELECTROSURGICAL) ×4 IMPLANT
EX-PIN ORTHOLOCK NAV 4X150 (PIN) ×2 IMPLANT
FEE CVG SUPP BRAINLAB NG SPNE (MISCELLANEOUS) IMPLANT
FEE INTRAOP CADWELL SUPPLY NCS (MISCELLANEOUS) ×2 IMPLANT
FEE INTRAOP MONITOR IMPULS NCS (MISCELLANEOUS) ×2 IMPLANT
FORCEPS BPLR BAYO 10IN 1.0TIP (ORTHOPEDIC DISPOSABLE SUPPLIES) IMPLANT
GAUZE 4X4 16PLY ~~LOC~~+RFID DBL (SPONGE) IMPLANT
GAUZE SPONGE 2X2 STRL 8-PLY (GAUZE/BANDAGES/DRESSINGS) IMPLANT
GLOVE BIOGEL PI IND STRL 6.5 (GLOVE) ×6 IMPLANT
GLOVE SURG SYN 6.5 ES PF (GLOVE) ×5
GLOVE SURG SYN 6.5 PF PI (GLOVE) ×10 IMPLANT
GLOVE SURG SYN 8.5 E (GLOVE) ×6
GLOVE SURG SYN 8.5 PF PI (GLOVE) ×12 IMPLANT
GOWN SRG LRG LVL 4 IMPRV REINF (GOWNS) ×4 IMPLANT
GOWN SRG XL LVL 3 NONREINFORCE (GOWNS) ×4 IMPLANT
GOWN STRL NON-REIN TWL XL LVL3 (GOWNS) ×2
GOWN STRL REIN LRG LVL4 (GOWNS) ×2
GUIDEWIRE NITINOL BEVEL TIP (WIRE) IMPLANT
HOLDER FOLEY CATH W/STRAP (MISCELLANEOUS) ×2 IMPLANT
INTRAOP CADWELL SUPPLY FEE NCS (MISCELLANEOUS)
INTRAOP DISP SUPPLY FEE NCS (MISCELLANEOUS)
INTRAOP MONITOR FEE IMPULS NCS (MISCELLANEOUS)
JET LAVAGE IRRISEPT WOUND (IRRIGATION / IRRIGATOR) ×1
KIT DILATOR XLIF 5 (KITS) IMPLANT
KIT DISP MARS 3V (KITS) IMPLANT
KIT PREVENA INCISION MGT 13 (CANNISTER) ×2 IMPLANT
KIT SPINAL PRONEVIEW (KITS) ×2 IMPLANT
KIT TURNOVER KIT A (KITS) ×2 IMPLANT
KNIFE BAYONET SHORT DISCETOMY (MISCELLANEOUS) IMPLANT
LAVAGE JET IRRISEPT WOUND (IRRIGATION / IRRIGATOR) ×2 IMPLANT
MANIFOLD NEPTUNE II (INSTRUMENTS) ×4 IMPLANT
MARKER SKIN DUAL TIP RULER LAB (MISCELLANEOUS) ×4 IMPLANT
MARKER SPHERE PSV REFLC 13MM (MARKER) ×14 IMPLANT
MODULE NVM5 NEXT GEN EMG (NEUROSURGERY SUPPLIES) IMPLANT
NDL SAFETY ECLIP 18X1.5 (MISCELLANEOUS) ×2 IMPLANT
NS IRRIG 1000ML POUR BTL (IV SOLUTION) ×2 IMPLANT
NS IRRIG 500ML POUR BTL (IV SOLUTION) IMPLANT
PACK LAMINECTOMY ARMC (PACKS) ×2 IMPLANT
PAD ARMBOARD 7.5X6 YLW CONV (MISCELLANEOUS) ×2 IMPLANT
PENCIL SMOKE EVACUATOR (MISCELLANEOUS) ×2 IMPLANT
ROD RELINE MAS LORD 5.5X75MM (Rod) IMPLANT
ROD SPINAL 5.5X80 TI LORDOSE (Rod) IMPLANT
SCREW LOCK RELINE 5.5 TULIP (Screw) IMPLANT
SCREW MAS RED RELINE 6.5X55 (Screw) IMPLANT
SCREW RELINE MAS RED 5.5X45MM (Screw) IMPLANT
SCREW RELINE RED 6.5X45MM POLY (Screw) IMPLANT
SPACER HEDRON 10D 18X45X13 (Spacer) IMPLANT
STAPLER SKIN PROX 35W (STAPLE) IMPLANT
SURGIFLO W/THROMBIN 8M KIT (HEMOSTASIS) ×2 IMPLANT
SUT DVC VLOC 3-0 CL 6 P-12 (SUTURE) ×2 IMPLANT
SUT ETHILON 3-0 FS-10 30 BLK (SUTURE)
SUT VIC AB 0 CT1 27 (SUTURE) ×2
SUT VIC AB 0 CT1 27XCR 8 STRN (SUTURE) ×4 IMPLANT
SUT VIC AB 2-0 CT1 18 (SUTURE) ×4 IMPLANT
SUTURE EHLN 3-0 FS-10 30 BLK (SUTURE) IMPLANT
SYR 10ML LL (SYRINGE) ×2 IMPLANT
SYR 30ML LL (SYRINGE) ×4 IMPLANT
TAPE CLOTH 3X10 WHT NS LF (GAUZE/BANDAGES/DRESSINGS) ×8 IMPLANT
TOWEL OR 17X26 4PK STRL BLUE (TOWEL DISPOSABLE) ×4 IMPLANT
TRAP FLUID SMOKE EVACUATOR (MISCELLANEOUS) ×2 IMPLANT
TRAY FOLEY SLVR 16FR LF STAT (SET/KITS/TRAYS/PACK) IMPLANT
TUBING CONNECTING 10 (TUBING) ×2 IMPLANT
WATER STERILE IRR 1000ML POUR (IV SOLUTION) ×4 IMPLANT
WATER STERILE IRR 500ML POUR (IV SOLUTION) IMPLANT

## 2023-03-09 NOTE — Anesthesia Postprocedure Evaluation (Signed)
Anesthesia Post Note  Patient: [name]  Procedure(s) Performed: L3-4 LATERAL LUMBAR INTERBODY FUSION (Spine Lumbar) L3-S1 INSTRUMENTATION WITH L3-4 FUSION (Spine Lumbar) APPLICATION OF INTRAOPERATIVE CT SCAN (Spine Lumbar)  Patient location during evaluation: PACU Anesthesia Type: General Level of consciousness: awake and alert Pain management: pain level controlled Vital Signs Assessment: post-procedure vital signs reviewed and stable Respiratory status: spontaneous breathing, nonlabored ventilation, respiratory function stable and patient connected to nasal cannula oxygen Cardiovascular status: blood pressure returned to baseline and stable Postop Assessment: no apparent nausea or vomiting Anesthetic complications: no   No notable events documented.   Last Vitals:  Vitals:   03/09/23 1200 03/09/23 1205  BP: 108/61   Pulse: (!) 58 (!) 59  Resp: 11 17  Temp:    SpO2: 99% 99%    Last Pain:  Vitals:   03/09/23 1205  TempSrc:   PainSc: 8                  Corinda Gubler

## 2023-03-09 NOTE — Progress Notes (Signed)
[facility] [phone]. This nurse spoke with on call service. Requested Lumbar Aspen brace to be delivered to patient's room.

## 2023-03-09 NOTE — H&P (Signed)
Referring Physician:  Venetia Night, MD [address] [place],  [place] [address]  Primary Physician:  Margaretann Loveless, MD  History of Present Illness: 03/09/2023 Ms. Denning presents today with continued symptoms.  12/09/2022 Ms. Saefong comes back to discuss her imaging findings.  She continues to have symptoms as noted below.  08/12/2022 She continues to have symptoms but some days are better than others.  She is trying physical therapy which is helping her a small amount.  06/30/2022 Ms. Rothrock returns to see me.  She continues to have symptoms as noted below.  She will start physical therapy tomorrow.  04/29/2022 Ms. Maila Patierno is here today with a chief complaint of low back pain that radiates in the bilateral hips and legs, right greater than left. Occasionally she will have numbness and tingling in the legs.  She mostly has pain in the right leg.  It has been going on for several months.  She reports pain as bad as 10 out of 10 with standing, walking, bending, lifting, and squatting.   Bowel/Bladder Dysfunction: none  Conservative measures:  Physical therapy: has not participated in Multimodal medical therapy including regular antiinflammatories: gabapentin, prednisone, tizanidine, tylenol Injections: has not received epidural steroid injections  Past Surgery:  Lumbar Surgery around 2008-2009 Cervical Surgery Brain Surgery for Aneurysm in 2013  Dhiya N Englander has no symptoms of cervical myelopathy.  The symptoms are causing a significant impact on the patient's life.   Review of Systems:  A 10 point review of systems is negative, except for the pertinent positives and negatives detailed in the HPI.  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 b.)07/18/2006 -endovas  oblit complex ACOM aneur. c.) Endovas Tx of 6.8x59mm RMCA aneur. d.) 3.5x32mm remnant RMCA aneur 2/2 coil compaction. e.) Interval 3.2x3.52mm saccular outpouching in ACOM c/w mild recannulization in neck. 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 g.) 3.7x82mm remnant of prev Tx'd ACOM aneur -failed embol 02/24/2012.   CKD (chronic kidney disease), stage IV (HCC)    a.) solitary functioning kidney on the RIGHT   Complication of anesthesia    a.) delayed emergence   Coronary artery disease 02/23/2007   a.) LHC 02/23/2007 --> EF 50%; 30% pLAD, 70% mRCA --> planned for staged PCI. b.) PCI 02/27/2007: EF 60%; 3.5 x 15 mm Vision BMS to 80% mRCA. b.) LHC 07/24/2007: EF 60%; minor irregs; no occlusive CAD; no intervention. c.) LHC 10/28/2010: EF 60%; 30% mLAD, LCx with minor luminal irregs, 20% ISR mRCA; no intervention. d.) LHC 05/31/2013: EF 60%; 40% mLAD, 30% ISR m-dRCA; no interventions.   DDD (degenerative disc disease), cervical    a.) s/p ACDF C5-C7; hardware in neck; patient appreciates stiffness and issues with mobility   Diastolic dysfunction    a.)  TTE 07/07/2021: EF 87.8%; normal PASP; trace TR/MR; G1DD.   Elevated LEFT hemidiaphragm    Gout    Headache(784.0)    Heart murmur    HLD (hyperlipidemia)    HOH (hard of hearing)    Has hearing aids, doesn't wear   Hyperkalemia    Hyperparathyroidism due to renal insufficiency (HCC)    Hypertension    Incomplete right bundle branch block (RBBB)    Insomnia    a.) takes zolpidem   Long term (current) use of  immunomodulator    a.) on DMARD therapy (hydroxychloroquine) for RA/SLE Dx.   Lumbar adjacent segment disease with spondylolisthesis    Multiple acquired cysts of kidney    On chronic clopidogrel therapy    OSA (obstructive sleep apnea)    a.) does NOT use nocturnal pap therapy   Osteoarthritis    Pancreatic cyst    Peripheral vascular disease (HCC)    Pneumonia    Post-COVID chronic cough    Pulmonary nodule    RBBB (right  bundle branch block)    Rectal bleeding    Rheumatoid arthritis (HCC)    a.) on DMARD; hydroxychloriquine   Shortness of breath 03/01/2023   Systemic lupus erythematosus (HCC)    T2DM (type 2 diabetes mellitus) (HCC)    Tendinitis of left wrist    Thickened endometrium    Wears dentures    partial lower    Past Surgical History: Past Surgical History:  Procedure Laterality Date   ANEURYSM COILING  08/2013   Attempted embolization of previously treated ACOM aneurysm; unsuccessful N/A 01/28/2012   Location: [facility]   BACK SURGERY     BARTHOLIN CYST MARSUPIALIZATION     BICEPT TENODESIS Right 03/02/2022   Procedure: BICEPS TENODESIS;  Surgeon: Christena Flake, MD;  Location: [facility];  Service: Orthopedics;  Laterality: Right;   BREAST CYST ASPIRATION Right 01/25/2012   FNA neg.   BREAST EXCISIONAL BIOPSY Left 06/26/2007   neg   BUNIONECTOMY Bilateral 2022   CARDIAC CATHETERIZATION Left 02/23/2007   Procedure: CARDIAC CATHETERIZATION; Location: [facility]; Surgeon: Adrian Blackwater, MD   CARDIAC CATHETERIZATION Left 07/31/2007   Procedure: CARDIAC CATHETERIZATION; Location: [facility]; Surgeon: Adrian Blackwater, MD   CARDIAC CATHETERIZATION Left 10/28/2010   Procedure: CARDIAC CATHETERIZATION; Location: [facility]; Surgeon: Adrian Blackwater, MD   CARDIAC CATHETERIZATION Left 05/31/2013   Procedure: CARDIAC CATHETERIZATION; Location: [facility]; Surgeon: Adrian Blackwater, MD   Carotid arteriogram; 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 N/A 07/18/2006   Location: [facility]   CATARACT EXTRACTION     CERVICAL FUSION N/A    Procedure: ACDF C5-C7   COLONOSCOPY WITH PROPOFOL N/A 10/04/2019   Procedure: COLONOSCOPY WITH BIOPSY;  Surgeon: Toney Reil, MD;  Location: [facility];  Service: Endoscopy;  Laterality: N/A;  Diabetic (borderline) - oral meds priority 3   CORONARY STENT INTERVENTION Left 02/27/2007   Procedure: CORONARY STENT INTERVENTION (3.5  x 15 mm Vision BMS to mRCA); Location: [facility]; Surgeon: Lorine Bears, MD   Endovascular near complete obliteration of enlarging neck remnant of previously treated RIGHT MCA aneurysm using stent assisted coiling N/A 09/03/2010   Location: [facility]   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 N/A 10/03/2006   Location: [facility]   HYSTEROSCOPY WITH D & C N/A 08/04/2021   Procedure: DILATATION AND CURETTAGE /HYSTEROSCOPY;  Surgeon: Natale Milch, MD;  Location: [facility];  Service: Gynecology;  Laterality: N/A;   POLYPECTOMY N/A 10/04/2019   Procedure: POLYPECTOMY;  Surgeon: Toney Reil, MD;  Location: [facility];  Service: Endoscopy;  Laterality: N/A;   REVERSE SHOULDER ARTHROPLASTY Right 03/02/2022   Procedure: REVERSE SHOULDER ARTHROPLASTY;  Surgeon: Christena Flake, MD;  Location: [facility];  Service: Orthopedics;  Laterality: Right;   UPPER ESOPHAGEAL ENDOSCOPIC ULTRASOUND (EUS) N/A 04/15/2016   Procedure: UPPER ESOPHAGEAL ENDOSCOPIC ULTRASOUND (EUS);  Surgeon: Bearl Mulberry, MD;  Location: [facility];  Service: Gastroenterology;  Laterality: N/A;    Allergies: Allergies as of  02/09/2023 - Review Complete 02/02/2023  Allergen Reaction Noted   Isosorbide dinitrate Other (See Comments) 07/15/2017   Amlodipine Swelling 07/23/2011   Sulfa antibiotics Itching 01/13/2012   Tizanidine hcl  08/26/2022   Oxycodone Itching 04/14/2021   Sulfasalazine Itching 01/13/2012    Medications: Current Meds  Medication Sig   allopurinol (ZYLOPRIM) 100 MG tablet TAKE 1 TABLET BY MOUTH EVERY DAY   cefdinir (OMNICEF) 300 MG capsule Take 1 capsule (300 mg total) by mouth daily. Increase water intake while taking this medication.   clopidogrel (PLAVIX) 75 MG tablet Take 75 mg by mouth daily.   clotrimazole-betamethasone (LOTRISONE) cream APPLY TWICE A DAY TO GROIN AREA SKIN FOR 1 WEEK AND THEN AS NEEDED.   fluticasone (FLONASE)  50 MCG/ACT nasal spray SPRAY 2 SPRAYS INTO EACH NOSTRIL EVERY DAY (Patient taking differently: Place 1 spray into both nostrils daily as needed.)   furosemide (LASIX) 40 MG tablet TAKE 1 TABLET BY MOUTH EVERY DAY   gabapentin (NEURONTIN) 100 MG capsule Take 300 mg by mouth at bedtime as needed.   glimepiride (AMARYL) 2 MG tablet Take 1 tablet (2 mg total) by mouth See admin instructions. Take 1 mg daily, may increase to 2 mg if blood sugar is over 175 (Patient taking differently: Take 2 mg by mouth daily with breakfast.)   hydrALAZINE (APRESOLINE) 100 MG tablet TAKE 1 TABLET BY MOUTH TWICE A DAY (Patient taking differently: Take 100 mg by mouth 2 (two) times daily. Takes/1/2 tablet BID)   hydroxychloroquine (PLAQUENIL) 200 MG tablet Take 200 mg by mouth daily.   labetalol (NORMODYNE) 300 MG tablet TAKE 1 TABLET BY MOUTH TWICE A DAY   losartan (COZAAR) 100 MG tablet Take 1 tablet (100 mg total) by mouth every morning.   rosuvastatin (CRESTOR) 40 MG tablet TAKE 1 TABLET BY MOUTH EVERY DAY   sodium zirconium cyclosilicate (LOKELMA) 10 g PACK packet Take 10 g by mouth as needed.   SYMBICORT 80-4.5 MCG/ACT inhaler INHALE 1 PUFF BY MOUTH TWICE A DAY   tretinoin (RETIN-A) 0.025 % cream TWICE A DAY   zolpidem (AMBIEN) 5 MG tablet Take 1 tablet (5 mg total) by mouth at bedtime as needed for sleep.    Social History: Social History   Tobacco Use   Smoking status: Never   Smokeless tobacco: Never  Vaping Use   Vaping status: Never Used  Substance Use Topics   Alcohol use: Yes    Comment: Occasionally at christmas   Drug use: No    Family Medical History: Family History  Problem Relation Age of Onset   Breast cancer Paternal Aunt 66   Breast cancer Paternal Aunt    Heart disease Mother    Heart disease Father    Stroke Father    Diabetes Sister    Diabetes Brother     Physical Examination: Vitals:   03/09/23 0627 03/09/23 0633  BP: (!) 123/96   Pulse: 65   Resp: 17   Temp:  98.4 F  (36.9 C)  SpO2: 97%    Heart sounds normal no MRG. Chest Clear to Auscultation Bilaterally.   General: Patient is well developed, well nourished, calm, collected, and in no apparent distress. Attention to examination is appropriate.  Neck:   Supple.  Full range of motion.  Respiratory: Patient is breathing without any difficulty.   NEUROLOGICAL:     Awake, alert, oriented to person, place, and time.  Speech is clear and fluent. Fund of knowledge is appropriate.   Cranial  Nerves: Pupils equal round and reactive to light.  Facial tone is symmetric.  Facial sensation is symmetric. Shoulder shrug is symmetric. Tongue protrusion is midline.  There is no pronator drift.  ROM of spine: full.    Strength: Side Biceps Triceps Deltoid Interossei Grip Wrist Ext. Wrist Flex.  R 5 5 5 5 5 5 5   L 5 5 5 5 5 5 5    Side Iliopsoas Quads Hamstring PF DF EHL  R 5 5 5 5 5 5   L 5 5 5 5 5 5    Reflexes are 1+ and symmetric at the biceps, triceps, brachioradialis, patella and achilles.   Hoffman's is absent.   Bilateral upper and lower extremity sensation is intact to light touch.    No evidence of dysmetria noted.  Gait is abnormal and requires a cane.     Medical Decision Making  Imaging: MRI L spine 10/02/2021 T12-L1: Disc bulge, new from the prior MRI. Progressive mild facet arthrosis and ligamentum flavum hypertrophy. Small volume fluid within the bilateral facet joints. No significant spinal canal or foraminal stenosis.   L1-L2: Progressive disc bulge. Progressive disc bulge. New superimposed small central disc extrusion with slight cranial and caudal migration. A small right subarticular/foraminal disc protrusion is also new. Progressive facet arthrosis (mild right, moderate left) and ligamentum flavum hypertrophy. There is now mild bilateral subarticular narrowing at this level (without appreciable nerve root impingement). Mild-to-moderate right neural foraminal narrowing is new  from the prior MRI.   L2-L3: Small disc bulge asymmetric to the left. Facet arthrosis (moderate right, moderate/advanced left) with ligamentum flavum hypertrophy, progressed. There is now mild left greater than right subarticular narrowing at this level (without appreciable nerve root impingement). Mild narrowing of the central canal is new from the prior MRI. Mild relative left neural foraminal narrowing, also new.   L3-L4: Small central zone posterior annular fissure, new from the prior MRI. Disc bulge, also new. Advanced facet arthrosis with ligamentum flavum hypertrophy, progressed. Bilateral facet joint effusions, new from the prior MRI. Severe central canal and left subarticular stenosis, new from the prior MRI. Moderate right subarticular stenosis is also new. There is now bilateral neural foraminal narrowing at this level (moderate right, mild left).   L4-L5: Interval posterior decompression and posterior spinal fusion. 3 mm fused grade 1 anterolisthesis. Endplate osteophytes. Facet arthrosis and ligamentum flavum hypertrophy. Mild-to-moderate spinal canal narrowing at the level of the L4 inferior endplate, improved from the prior MRI. Endplate osteophytes contribute to moderate right neural foraminal narrowing. No appreciable left foraminal stenosis.   L5-S1: 3 mm grade 1 anterolisthesis. Disc uncovering. Central to left foraminal zone posterior annular fissure. Facet arthrosis (moderate right, moderate/advanced left). Slight ligamentum flavum hypertrophy on the left. Moderate left subarticular stenosis with crowding of the descending left S1 nerve root (series 10, image 31). Mild right subarticular narrowing (without appreciable nerve root impingement). Central canal patent. Mild relative left neural foraminal narrowing.   IMPRESSION: Since the prior lumbar spine MRI 02/26/2006, there has been interval posterior decompression and posterior spinal fusion at  L4-L5. Multifactorial mild-to-moderate spinal canal narrowing at the level of the L4 inferior endplate has improved. Endplate osteophytes contribute to moderate right neural foraminal narrowing at this level.   Additional lumbar and lower thoracic spondylosis, as outlined and having progressed at multiple levels since the prior MRI of 02/26/2006. Findings are most notably as follows.   At L3-L4, there is progressive multifactorial severe central canal and left subarticular stenosis, and progressive moderate right  subarticular stenosis. Progressive moderate bilateral neural foraminal narrowing at this level. Also of note, facet arthrosis is advanced at this level and bilateral facet joint effusions are present.   No more than mild relative narrowing of the central canal at the remaining levels. Additional sites of subarticular stenosis, as detailed and greatest on the left at L5-S1 (moderate at this site with crowding of the descending left S1 nerve root).   Progressive advanced disc degeneration at L1-L2.     Electronically Signed   By: Jackey Loge D.O.   On: 10/02/2021 09:20  MRI L spine 12/05/2022 IMPRESSION: 1. Interval enlargement of a T2 hyperintense retroperitoneal mass anterior to the aorta at L3 compared with prior PET-CT. 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 Further evaluation recommended with MRI of the abdomen without and with contrast. 2. No acute findings are seen in the lumbar spine. No significant change from previous lumbar MRI approximately 14 months ago. 3. Stable postsurgical changes at L4-5 and L5-S1. 4. 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 5. No acute osseous findings. 6. These results will be called to the ordering clinician  or representative by the Radiologist Assistant, and communication documented in the PACS or [employer].     Electronically Signed   By: Carey Bullocks M.D.   On: 12/05/2022 15:01 I have personally reviewed the images and agree with the above interpretation.  Assessment and Plan: Ms. Sanchez is a pleasant 77 y.o. 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  She has tried and failed conservative management including physical therapy and injections.  At this point, no further conservative management is indicated. We will proceed with L3-4 lateral lumbar interbody fusion with extension of her prior instrumentation L3.  This will involve removal and replacement of her instrumentation from L3-S1.      Lonzy Mato K. Myer Haff MD, [facility]

## 2023-03-09 NOTE — Anesthesia Preprocedure Evaluation (Signed)
Anesthesia Evaluation  Patient identified by MRN, date of birth, ID band Patient awake    Reviewed: Allergy & Precautions, H&P , NPO status , Patient's Chart, lab work & pertinent test results  History of Anesthesia Complications (+) PROLONGED EMERGENCE and history of anesthetic complications  Airway Mallampati: III  TM Distance: <3 FB Neck ROM: limited    Dental  (+) Poor Dentition, Caps, Dental Advidsory Given, Implants   Pulmonary shortness of breath and with exertion, sleep apnea  Elevated LEFT hemidiaphragm   Pulmonary exam normal breath sounds clear to auscultation       Cardiovascular Exercise Tolerance: Good hypertension, (-) angina + CAD, + Cardiac Stents and + Peripheral Vascular Disease  (-) Past MI Normal cardiovascular exam+ dysrhythmias (RBBB) + Valvular Problems/Murmurs  Rhythm:Regular Rate:Normal - Systolic murmurs TTE performed on 07/07/2021 revealed normal left ventricular systolic function with an EF of 87.8%.  Diastolic Doppler parameters consistent with impaired relaxation (G1DD).  PASP normal.  There was trace tricuspid and mitral valve regurgitation.  There was no evidence of a significant transvalvular gradient to suggest stenosis.  Aorta normal in size with no evidence of aneurysmal dilatation.   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  ACOM aneurysms measured 6.2 x 4.5 mm and 4.9 x 4.2 mm.  RIGHT MCA aneurysm measured 7.0 x 4.5 mm.  Patient underwent endovascular obliteration of her complex ACOM aneurysms on 07/18/2006.  Patient underwent endovascular treatment of her RIGHT MCA aneurysm on 10/03/2006.  Patient noted to have 3.5 x 3.0 remnant of the RIGHT MCA trifurcation region aneurysm secondary to coil compaction on 09/14/2007.  Patient found to have 3.2 x 3.1 mm  saccular outpouching in the previously treated ACOM aneurysm consistent with mild recannulization in the neck.  Patient underwent endovascular near complete obliteration of and enlarging neck aneurysm on 09/03/2010.  This was her previously treated RIGHT MCA aneurysm.  Procedure completed using stent assisted coiling.  Patient found to have a 3.7 x 2 mm neck remnant of a previously treated ACOM aneurysm on 01/28/2012.  She underwent attempted embolization procedure on 02/24/2012, however procedure was unsuccessful.   Neuromuscular disease (Lumbar adjacent segment disease with spondylolisthesis)  negative psych ROS   GI/Hepatic negative GI ROS, Neg liver ROS,neg GERD  ,,  Endo/Other  diabetes, Type 2    Renal/GU CRFRenal disease (Stage IV)Potassium WNL today  negative genitourinary   Musculoskeletal  (+) Arthritis , Rheumatoid disorders,    Abdominal   Peds  Hematology  (+) Blood dyscrasia, anemia   Anesthesia Other Findings Patient has cardiac clearance for this procedure.   Past Medical History: No date: Anemia of chronic renal disease No date: Arthritis 06/07/2006: Cerebral aneurysm     Comment:  a.) 6.2x4.2mm and 4.9x4.54mm ACOM & 7x4.65mm RMCA aneur.               b.)07/18/2006 -endovas oblit complex ACOM aneur. c.)               Endovas Tx of 6.8x92mm RMCA aneur. d.) 3.5x5mm remnant               RMCA aneur 2/2 coil compaction. e.) Interval 3.2x3.34mm               saccular outpouching in ACOM c/w mild recannulization in               neck. f.) Endovas near  complete oblit of enlarging RMCA.               g.) 3.7x55mm remnant of prev Tx'd ACOM aneur -failed embol              02/24/2012. No date: CKD (chronic kidney disease), stage IV (HCC)     Comment:  a.) solitary functioning kidney on the RIGHT No date: Complication of anesthesia     Comment:  a.) delayed emergence 02/23/2007: Coronary artery disease     Comment:  a.) LHC 02/23/2007 --> EF 50%; 30% pLAD, 70% mRCA -->                planned for staged PCI. b.) PCI 02/27/2007: EF 60%; 3.5 x              15 mm Vision BMS to 80% mRCA. b.) LHC 07/24/2007: EF 60%;              minor irregs; no occlusive CAD; no intervention. c.) LHC               10/28/2010: EF 60%; 30% mLAD, LCx with minor luminal               irregs, 20% ISR mRCA; no intervention. d.) LHC               05/31/2013: EF 60%; 40% mLAD, 30% ISR m-dRCA; no               interventions. No date: Diastolic dysfunction     Comment:  a.)  TTE 07/07/2021: EF 87.8%; normal PASP; trace TR/MR;              G1DD. No date: Elevated hemidiaphragm     Comment:  a.) LEFT No date: Headache(784.0) No date: Heart murmur No date: HLD (hyperlipidemia) No date: HOH (hard of hearing)     Comment:  Has hearing aids, doesn't wear No date: Hyperparathyroidism due to renal insufficiency (HCC) No date: Hypertension No date: Incomplete right bundle branch block (RBBB) No date: Insomnia     Comment:  a.) takes zolpidem No date: Long term (current) use of immunomodulator     Comment:  a.) on DMARD therapy (hydroxychloriquine) for RA/SLE Dx. No date: Long term current use of antithrombotics/antiplatelets     Comment:  a.) clopidogrel No date: Multiple acquired cysts of kidney No date: Neck stiffness     Comment:  screws in neck.  Some limitations of movement No date: OSA (obstructive sleep apnea)     Comment:  a.) does NOT use nocturnal pap therapy No date: Pancreatic cyst No date: RBBB (right bundle branch block) No date: Rheumatoid arthritis (HCC)     Comment:  a.) on DMARD; hydroxychloriquine No date: Systemic lupus erythematosus (HCC) No date: T2DM (type 2 diabetes mellitus) (HCC) No date: Wears dentures     Comment:  partial lower   Reproductive/Obstetrics negative OB ROS                             Anesthesia Physical Anesthesia Plan  ASA: 3  Anesthesia Plan: General   Post-op Pain Management: Ofirmev IV (intra-op)*    Induction: Intravenous  PONV Risk Score and Plan: 4 or greater and Ondansetron, Dexamethasone and Treatment may vary due to age or medical condition  Airway Management Planned: Oral ETT and Video Laryngoscope Planned  Additional Equipment: None  Intra-op Plan:   Post-operative Plan: Extubation in OR  Informed Consent: I have reviewed the patients History and Physical, chart, labs and discussed the procedure including the risks, benefits and alternatives for the proposed anesthesia with the patient or authorized representative who has indicated his/her understanding and acceptance.     Dental Advisory Given  Plan Discussed with: Anesthesiologist, CRNA and Surgeon  Anesthesia Plan Comments: (Patient consented for risks of anesthesia including but not limited to:  - adverse reactions to medications - damage to eyes, teeth, lips or other oral mucosa - nerve damage due to positioning  - sore throat or hoarseness - Damage to heart, brain, nerves, lungs, other parts of body or loss of life  Patient voiced understanding.   Update 15:30 on 02/02/23: case canceled for potassium 5.9, verified in lab.  Discussed with patient and husband, both understand and agree to make appointment with Dr. Thedore Mins.  Also discussed with Dr. Myer Haff.)        Anesthesia Quick Evaluation

## 2023-03-09 NOTE — Op Note (Signed)
Indications: [name] is a 77 y.o. female with Lumbar adjacent segment disease with spondylolisthesis - M51.36, M43.16     Findings: expansion of disc space, L L3 pedicle fracture  Preoperative Diagnosis: Lumbar adjacent segment disease with spondylolisthesis - M51.36, M43.16  Postoperative Diagnosis: same   EBL: 50 ml IVF: see anesthesia record Drains: none Disposition: Extubated and Stable to PACU Complications: none  A foley catheter was placed.   Preoperative Note:   Risks of surgery discussed include: infection, bleeding, stroke, coma, death, paralysis, CSF leak, nerve/spinal cord injury, numbness, tingling, weakness, complex regional pain syndrome, recurrent stenosis and/or disc herniation, vascular injury, development of instability, neck/back pain, need for further surgery, persistent symptoms, development of deformity, and the risks of anesthesia. The patient understood these risks and agreed to proceed.  NAME OF ANTERIOR PROCEDURE:               1. Anterior lumbar interbody fusion via a right lateral retroperitoneal approach at L3/4 2. Placement of a Lordotic Globus Hedron interbody cage, filled with Demineralized Bone Matrix  NAME OF POSTERIOR PROCEDURE 1. Posterior instrumentation using Nuvasive Reline Instrumentation, L2-5 2. Posterolateral fusion, L3/4, utilizing demineralized bone matrix 3. Use of Stereotaxis   PROCEDURE:  Patient was brought to the operating room, intubated, turned to the lateral position.  All pressure points were checked and double-checked.  The patient was prepped and draped in the standard fashion. Prior to prepping, fluoroscopy was brought in and the patient was positioned with a large bump under the contralateral side between the iliac crest and rib cage, allowing the area between the iliac crest and the lateral aspect of the rib cage to open and increase the ability to reach inferiorly, to facilitate entry into the disc space.   The incision was marked upon the skin both the location of the disc space as well as the superior most aspect of the iliac crest.  Based on the identification of the disc space an incision was prepared, marked upon the skin and eventually was used for our lateral incision.  The fluoroscopy was turned into a cross table A/P image in order to confirm that the patient's spine remained in a perpendicular trajectory to the floor without rotation.  Once confirming that all the pressure points were checked and double-checked and the patient remained in sturdy position strapped down in this slightly jack-knifed lateral position, the patient was prepped and draped in standard fashion.  The skin was injected with local anesthetic, then incised until the abdominal wall fascia was noted.  I bluntly dissected posteriorly until we were able to identify the posterior musculature near petit's triangle.  At this point, using primarily blunt dissection with our finger aided with a metzenbaum scissor, were able to enter the retroperitoneal cavity.  The retroperitoneal potential space was opened further until palpating out the psoas muscle, the medial aspect of the iliac crest, the medial aspect of the last rib and continued to define the retroperitoneal space with blunt dissection in order to facilitate safe placement of our dilators.    While protecting by dissecting directly onto a finger in the retroperitoneum, the retroperitoneal space was entered safely from the lateral incision and the initial dilator placed onto the muscle belly of the psoas.  While directly stimulating the dilator and after radiographically confirming our location relative to the disc space, I placed the dilator through the psoas.  The dilators were stimulated to ensure remaining safely away from any of the lumbar plexus nerves;  the dilators were repositioned until no pathologic stimulation was appreciated.  Once I had confirmed the location of our initial  dilator radiographically, a K-wire secured the dilator into the L3/4 disc space and confirmed position under A/P and lateral fluoroscopy.  At this point, I dilated up with direct stimulation to confirm lack of pathologic stimulation.  Once all the dilators were in position, I placed in the retractor and secured it onto the table, locked into position and confirmed under A/P and lateral fluoroscopy to confirm our approach angle to the disc space as well as location relative to the disc space.  I then placed the muscle stimulator in through the working channel down to the vertebral body, stimulating the entire lateral surface of the vertebral body and any of the visualized psoas muscle that was adjacent to the retractor, confirming again the safe passage to the psoas before we began performing the discectomy.  At this point, we began our discectomy at L3/4.  The disc was incised laterally throughout the extent of our exposure. Using a combination of pituitary rongeurs, Kerrison rongeurs, rasps, curettes of various sorts, we were able to begin to clean out the disc space.  Once we had cleaned out the majority of the disc space, we then cut the lateral annulus with a cob, breaking the lateral annual attachments on the contralateral side by subtly working the cob through the annulus while using flouroscopy.  Care was taken not to extend further than required after cutting the annular attachments.  After this had been performed, we prepared the endplates for placement of our graft, sized a graft to the disc space by serially dilating up in trial sizes until we confirmed that our graft would be well positioned, allowing distraction while maintaining good grip.  This was confirmed under A/P and lateral fluoroscopy in order to ensure its placement as an eventual trial for placement of our final graft.  We irrigated with saline.  Once confirmed placement, the Hedron implant filled with allograft was impacted into position  at L3/4.   Through a combination of intradiscal distraction and anterior releasing, we were able to correct the anterior deformity during disc preparation and placement of the graft.  At this point, final radiographs were performed, and we began closure.  The wound was closed using 0 Vicryl interrupted suture in the fascia and 2-0 Vicryl inverted suture were placed in the subcutaneous tissue and dermis. 3-0 monocryl was used for final closure. Dermabond was used to close the skin.    After closing the anterior part in layers, the patient was repositioned into prone position.  All pressure points were checked and double-checked.  The posterior operative site was prepped and draped in standard fashion.  The stereotactic array was placed.  Stereotactic images were acquired using intraoperative CT scanning.  This was registered to the patient.  Using stereotaxis, screw trajectories were planned and incisions made.  The pedicles from L3 were cannulated bilaterally and K wires used to secure the tracks.  We then exposed the prior instrumentation at L4 5.  This was carefully removed.  The rod connector was left in place.  6.5 x 45 mm screws were placed at L4 and L5 bilaterally.  We then advanced a 5.5 x 45 mm screws over the K wires.  On the left side, the pedicle was noted to have poor fixation.  The stereotactic C-arm was brought into the field to evaluate the screw placement.  The screw appeared to be in reasonable  trajectory, but the pedicle appeared to be fractured.  A second attempt was made to cannulate the left L3 pedicle.  This also resulted in an adequate fixation.  Thus, intraoperative decision was made to extend instrumentation to L2.  Using the stereotactic system, the L2 pedicle screw tracts were drilled and secured.  5.5 x 45 mm screws were advanced over the K wires at the L2 level.  At each level, Nuvasive Reline pedicle screws were placed.  Once the screws were placed, the screw extensions  were then linked, a path was formed for the rod and a rod was utilized to connect the screws.  We then compressed, torqued / counter-torqued and removed the screw assembly. Once performed on each side, the C-arm was brought back and to take confirmatory CT scan showing appropriate placement of all instrumentation and anatomic alignment.    Posterolateral arthrodesis was performed at L3-L4 utilizing demineralized bone matrix.  We irrigated each incision and obtained hemostasis. Each wound was closed using 0 Vicryl interrupted suture in the fascia, 2-0 Vicryl inverted suture were placed in the subcutaneous tissue and dermis. 3-0 monocryl was used for final closure. Dermabond was used to close the skin.    Needle, lap and all counts were correct at the end of the case.     Manning Charity PA assisted in the entire procedure. An assistant was required for this procedure due to the complexity.  The assistant provided assistance in tissue manipulation and suction, and was required for the successful and safe performance of the procedure. I performed the critical portions of the procedure.   Venetia Night MD Neurosurgery

## 2023-03-09 NOTE — Anesthesia Procedure Notes (Signed)
Procedure Name: Intubation Date/Time: 03/09/2023 7:27 AM  Performed by: Katherine Basset, CRNAPre-anesthesia Checklist: Patient identified, Emergency Drugs available, Suction available and Patient being monitored Patient Re-evaluated:Patient Re-evaluated prior to induction Oxygen Delivery Method: Circle system utilized Preoxygenation: Pre-oxygenation with 100% oxygen Induction Type: IV induction Laryngoscope Size: Miller and 2 Grade View: Grade I Tube type: Oral Tube size: 6.5 mm Number of attempts: 1 Airway Equipment and Method: Stylet, Oral airway and Bite block Placement Confirmation: ETT inserted through vocal cords under direct vision, positive ETCO2 and breath sounds checked- equal and bilateral Secured at: 20 cm Tube secured with: Tape Dental Injury: Teeth and Oropharynx as per pre-operative assessment

## 2023-03-09 NOTE — Progress Notes (Addendum)
Error

## 2023-03-09 NOTE — Transfer of Care (Signed)
Immediate Anesthesia Transfer of Care Note  Patient: [name]  Procedure(s) Performed: L3-4 LATERAL LUMBAR INTERBODY FUSION (Spine Lumbar) L3-S1 INSTRUMENTATION WITH L3-4 FUSION (Spine Lumbar) APPLICATION OF INTRAOPERATIVE CT SCAN (Spine Lumbar)  Patient Location: PACU  Anesthesia Type:General  Level of Consciousness: drowsy  Airway & Oxygen Therapy: Patient Spontanous Breathing and Patient connected to face mask oxygen  Post-op Assessment: Report given to RN, Post -op Vital signs reviewed and stable, and Patient moving all extremities  Post vital signs: Reviewed and stable  Last Vitals:  Vitals Value Taken Time  BP 117/60 03/09/23 1116  Temp 36.1 C 03/09/23 1116  Pulse 61 03/09/23 1122  Resp 12 03/09/23 1118  SpO2 99 % 03/09/23 1122  Vitals shown include unfiled device data.  Last Pain:  Vitals:   03/09/23 1116  TempSrc:   PainSc: Asleep         Complications: No notable events documented.

## 2023-03-10 ENCOUNTER — Encounter: Payer: Self-pay | Admitting: Neurosurgery

## 2023-03-10 LAB — BASIC METABOLIC PANEL
Anion gap: 10 (ref 5–15)
BUN: 54 mg/dL — ABNORMAL HIGH (ref 8–23)
CO2: 20 mmol/L — ABNORMAL LOW (ref 22–32)
Calcium: 8.2 mg/dL — ABNORMAL LOW (ref 8.9–10.3)
Chloride: 111 mmol/L (ref 98–111)
Creatinine, Ser: 2.51 mg/dL — ABNORMAL HIGH (ref 0.44–1.00)
GFR, Estimated: 19 mL/min — ABNORMAL LOW (ref >60)
Glucose, Bld: 174 mg/dL — ABNORMAL HIGH (ref 70–99)
Potassium: 4.8 mmol/L (ref 3.5–5.1)
Sodium: 141 mmol/L (ref 135–145)

## 2023-03-10 LAB — CBC
HCT: 24.9 % — ABNORMAL LOW (ref 36.0–46.0)
Hemoglobin: 8.4 g/dL — ABNORMAL LOW (ref 12.0–15.0)
MCH: 30.4 pg (ref 26.0–34.0)
MCHC: 33.7 g/dL (ref 30.0–36.0)
MCV: 90.2 fL (ref 80.0–100.0)
Platelets: 136 10*3/uL — ABNORMAL LOW (ref 150–400)
RBC: 2.76 MIL/uL — ABNORMAL LOW (ref 3.87–5.11)
RDW: 13 % (ref 11.5–15.5)
WBC: 8.7 10*3/uL (ref 4.0–10.5)
nRBC: 0 % (ref 0.0–0.2)

## 2023-03-10 LAB — GLUCOSE, CAPILLARY
Glucose-Capillary: 141 mg/dL — ABNORMAL HIGH (ref 70–99)
Glucose-Capillary: 157 mg/dL — ABNORMAL HIGH (ref 70–99)
Glucose-Capillary: 183 mg/dL — ABNORMAL HIGH (ref 70–99)
Glucose-Capillary: 175 mg/dL — ABNORMAL HIGH (ref 70–99)

## 2023-03-10 MED ORDER — HEPARIN SODIUM (PORCINE) 5000 UNIT/ML IJ SOLN
INTRAMUSCULAR | Status: AC
  Filled 2023-03-10: qty 1

## 2023-03-10 MED ORDER — LOSARTAN POTASSIUM 50 MG PO TABS
ORAL_TABLET | ORAL | Status: AC
  Filled 2023-03-10: qty 2

## 2023-03-10 MED ORDER — HYDRALAZINE HCL 50 MG PO TABS
ORAL_TABLET | ORAL | Status: AC
  Filled 2023-03-10: qty 2

## 2023-03-10 MED ORDER — FUROSEMIDE 20 MG PO TABS
ORAL_TABLET | ORAL | Status: AC
  Filled 2023-03-10: qty 2

## 2023-03-10 MED ORDER — HYDROCODONE-ACETAMINOPHEN 5-325 MG PO TABS
ORAL_TABLET | ORAL | Status: AC
  Filled 2023-03-10: qty 2

## 2023-03-10 MED ORDER — ACETAMINOPHEN 500 MG PO TABS
ORAL_TABLET | ORAL | Status: AC
  Filled 2023-03-10: qty 2

## 2023-03-10 MED ORDER — SENNA 8.6 MG PO TABS
ORAL_TABLET | ORAL | Status: AC
  Filled 2023-03-10: qty 1

## 2023-03-10 MED ORDER — GLIMEPIRIDE 1 MG PO TABS
ORAL_TABLET | ORAL | Status: AC
  Filled 2023-03-10: qty 2

## 2023-03-10 MED ORDER — DOCUSATE SODIUM 100 MG PO CAPS
ORAL_CAPSULE | ORAL | Status: AC
  Filled 2023-03-10: qty 1

## 2023-03-10 MED ORDER — ZOLPIDEM TARTRATE 5 MG PO TABS
ORAL_TABLET | ORAL | Status: AC
  Filled 2023-03-10: qty 1

## 2023-03-10 MED ORDER — INSULIN ASPART 100 UNIT/ML IJ SOLN
INTRAMUSCULAR | Status: AC
  Filled 2023-03-10: qty 1

## 2023-03-10 MED ORDER — GABAPENTIN 300 MG PO CAPS
ORAL_CAPSULE | ORAL | Status: AC
  Filled 2023-03-10: qty 1

## 2023-03-10 NOTE — Progress Notes (Signed)
Patient is not able to walk the distance required to go the bathroom, or he/she is unable to safely negotiate stairs required to access the bathroom.  A 3in1 BSC will alleviate this problem  

## 2023-03-10 NOTE — Plan of Care (Signed)
  Problem: Education: Goal: Ability to verbalize activity precautions or restrictions will improve Outcome: Progressing   Problem: Activity: Goal: Will remain free from falls Outcome: Progressing   Problem: Pain Management: Goal: Pain level will decrease Outcome: Progressing   Problem: Nutritional: Goal: Maintenance of adequate nutrition will improve Outcome: Progressing

## 2023-03-10 NOTE — Progress Notes (Addendum)
Physical Therapy Treatment Patient Details Name: [name] MRN: [id number] DOB: 1946-05-12 Today's Date: 03/10/2023   History of Present Illness Pt is a 77 year old female, underwent L3-4 LATERAL LUMBAR INTERBODY FUSION and  L3-S1 INSTRUMENTATION WITH L3-4 FUSION. PMH: Amemia of chronic renal disease, arthritis, cerebral aneurysm, CKD, coronary artery disease, HTN, T2D, DDD.    PT Comments  Pt presents sitting in bed, 7/10 pain at incision site following mobility. She continues to be CGA for Sit<>stand with RW for safety. She tolerated ambulating ~154ft with CGA/RW and ~59ft with no AD/CGA. Pt voiced increased wobbliness/ unsteadiness when ambulating without AD which resolved once RW was given back.  Pt ambulated to toilet to void and able to perform hygiene independently. She was also able to ascend/descend 5+5 stairs with B/L rails,CGA, and reciprocal stepping. She continued to exhibit some impulsive tendencies but has shown some improvement since previous sessions. PT educated on safe DME use, reinforced safe stair negotiation, and pt able to teach back BLT precautions. She would benefit from continued skilled care to maximize functional abilities.    If plan is discharge home, recommend the following: A little help with walking and/or transfers;A little help with bathing/dressing/bathroom;Help with stairs or ramp for entrance;Assist for transportation   Can travel by private vehicle        Equipment Recommendations  Rolling walker (2 wheels)    Recommendations for Other Services       Precautions / Restrictions Precautions Precautions: Back Precaution Booklet Issued: No Required Braces or Orthoses: Spinal Brace Spinal Brace:  (LSO) Restrictions Weight Bearing Restrictions: No     Mobility  Bed Mobility      General bed mobility comments: not observed during session    Transfers Overall transfer level: Needs assistance Equipment used: Rolling walker (2  wheels) Transfers: Sit to/from Stand Sit to Stand: Contact guard assist           General transfer comment: CGA for safety    Ambulation/Gait Ambulation/Gait assistance: Contact guard assist Gait Distance (Feet): 200 Feet Assistive device: Rolling walker (2 wheels), None Gait Pattern/deviations: Step-through pattern, Decreased step length - left, Decreased step length - right Gait velocity: decreased     General Gait Details: Ambulated ~26ft with no AD, pt voiced increased wobbliness/unsteadiness which resolved once RW was given back   Stairs Stairs: Yes Stairs assistance: Contact guard assist Stair Management: Two rails, Alternating pattern Number of Stairs: 10 (5+5) General stair comments: ascend/descend, able to perform with alternate step pattern but PT reinforced step to to maximizing safety   Wheelchair Mobility     Tilt Bed    Modified Rankin (Stroke Patients Only)       Balance Overall balance assessment: Needs assistance Sitting-balance support: Feet supported, Bilateral upper extremity supported Sitting balance-Leahy Scale: Good     Standing balance support: Bilateral upper extremity supported, During functional activity Standing balance-Leahy Scale: Good                              Cognition Arousal: Alert Behavior During Therapy: Impulsive Overall Cognitive Status: Within Functional Limits for tasks assessed                                 General Comments: continued intermittent impulsive tendencies and decreased safety awareness during session        Exercises Other Exercises Other Exercises: PT  educated on BLT precautions and safe stair negotiation    General Comments        Pertinent Vitals/Pain Pain Assessment Pain Assessment: 0-10 Pain Score: 7  Pain Location: back after mobility Pain Descriptors / Indicators: Operative site guarding, Grimacing, Discomfort Pain Intervention(s): Limited activity  within patient's tolerance, Monitored during session    Home Living                          Prior Function            PT Goals (current goals can now be found in the care plan section) Acute Rehab PT Goals Patient Stated Goal: return home PT Goal Formulation: With patient Time For Goal Achievement: 03/24/23 Potential to Achieve Goals: Good Progress towards PT goals: Progressing toward goals    Frequency    Min 1X/week      PT Plan      Co-evaluation              AM-PAC PT "6 Clicks" Mobility   Outcome Measure  Help needed turning from your back to your side while in a flat bed without using bedrails?: A Little Help needed moving from lying on your back to sitting on the side of a flat bed without using bedrails?: A Little Help needed moving to and from a bed to a chair (including a wheelchair)?: A Little Help needed standing up from a chair using your arms (e.g., wheelchair or bedside chair)?: A Little Help needed to walk in hospital room?: A Little Help needed climbing 3-5 steps with a railing? : A Little 6 Click Score: 18    End of Session Equipment Utilized During Treatment: Gait belt;Back brace Activity Tolerance: Patient tolerated treatment well Patient left: in chair;with call bell/phone within reach Nurse Communication: Mobility status PT Visit Diagnosis: Other abnormalities of gait and mobility (R26.89)     Time: 1437-1500 PT Time Calculation (min) (ACUTE ONLY): 23 min  Charges:    $Therapeutic Activity: 23-37 mins PT General Charges $$ ACUTE PT VISIT: 1 Visit                     Jordin Dambrosio, PT, SPT 3:42 PM,03/10/23

## 2023-03-10 NOTE — Evaluation (Signed)
Occupational Therapy Evaluation Patient Details Name: [name] MRN: [id number] DOB: [birth year] Today's Date: 03/10/2023   History of Present Illness Pt is a 77 year old female, underwent L3-4 LATERAL LUMBAR INTERBODY FUSION and  L3-S1 INSTRUMENTATION WITH L3-4 FUSION. PMH: Amemia of chronic renal disease, arthritis, cerebral aneurysm, CKD, coronary artery disease, HTN, T2D, DDD.   Clinical Impression   Pt seen for OT evaluation this date, from above surgery. Prior to hospital admission, pt was independent with mobility, ADL, and IADL. Pt lives with spouse who is able to assist her in ADL/IADLs while healing aswell as a daughter. Currently pt is CGA for standing and MIN A for rolling in/out of bed. Pt educated in spinal precautions, compression sock management, and home/routines modifications to maximize safety and functional independence while minimizing falls risk and maintaining precautions. Pt and family verbalized understanding of all education/training provided and stated she has no further concerns about returning home. Handout provided to support recall and carry over of learned precautions/techniques for bed mobility, functional transfers, and self care skills. No additional skilled OT needs at this time, will sign off. Upon hospital discharge, pt safe to discharge home.        If plan is discharge home, recommend the following: A little help with walking and/or transfers;Assistance with cooking/housework;A lot of help with bathing/dressing/bathroom    Functional Status Assessment  Patient has not had a recent decline in their functional status  Equipment Recommendations  None recommended by OT    Recommendations for Other Services       Precautions / Restrictions Precautions Precautions: Back Precaution Booklet Issued: No Precaution Comments: Pt able to recall 3/3 precautions at start of session Required Braces or Orthoses: Spinal Brace Spinal Brace:   (LSO) Restrictions Weight Bearing Restrictions: No      Mobility Bed Mobility Overal bed mobility: Needs Assistance Bed Mobility: Rolling, Sidelying to Sit, Sit to Sidelying Rolling: Contact guard assist Sidelying to sit: Min assist     Sit to sidelying: Min assist General bed mobility comments: Adhering to back precautions Edu: rolling    Transfers Overall transfer level: Needs assistance (Back precautions) Equipment used: None Transfers: Sit to/from Stand Sit to Stand: Contact guard assist                  Balance Overall balance assessment: Independent                                         ADL either performed or assessed with clinical judgement   ADL Overall ADL's : Needs assistance/impaired                     Lower Body Dressing: Moderate assistance;Adhering to back precautions;Sit to/from stand   Toilet Transfer: Electronics engineer Details (indicate cue type and reason): Simulated toilet transfer   Toileting - Clothing Manipulation Details (indicate cue type and reason): Edu: Percautions to limit twisting when cleaning     Functional mobility during ADLs: Caregiver able to provide necessary level of assistance;Contact guard assist General ADL Comments: CGA standing and lift off from sitting position     Vision         Perception         Praxis         Pertinent Vitals/Pain Pain Assessment Pain Assessment: 0-10 Pain Score: 5  Pain Location: back Pain  Descriptors / Indicators: Operative site guarding, Grimacing, Aching Pain Intervention(s): Limited activity within patient's tolerance, Monitored during session, Repositioned     Extremity/Trunk Assessment Upper Extremity Assessment Upper Extremity Assessment: Overall WFL for tasks assessed   Lower Extremity Assessment Lower Extremity Assessment: LLE deficits/detail;RLE deficits/detail;Generalized weakness RLE Sensation: WNL LLE  Sensation: WNL       Communication Communication Communication: No apparent difficulties Cueing Techniques: Verbal cues;Visual cues;Tactile cues   Cognition Arousal: Alert Behavior During Therapy: WFL for tasks assessed/performed Overall Cognitive Status: Within Functional Limits for tasks assessed                                       General Comments  Incision dressing changed by nurse during session    Exercises Other Exercises Other Exercises: Edu: Home safety, compression sock menagement, safe ADL completion, re edu of back precautions   Shoulder Instructions      Home Living Family/patient expects to be discharged to:: Private residence Living Arrangements: Children Available Help at Discharge: Family Type of Home: Apartment Home Access: Stairs to enter Secretary/administrator of Steps: 13 Entrance Stairs-Rails: Can reach both Home Layout: One level               Home Equipment: Cane - single point          Prior Functioning/Environment Prior Level of Function : Independent/Modified Independent             Mobility Comments: has been using SPC ADLs Comments: IND ADLs        OT Problem List: Decreased strength;Decreased safety awareness;Decreased knowledge of precautions      OT Treatment/Interventions:      OT Goals(Current goals can be found in the care plan section) Acute Rehab OT Goals Patient Stated Goal: return home OT Goal Formulation: With patient/family Time For Goal Achievement: 03/24/23 Potential to Achieve Goals: Good  OT Frequency:      Co-evaluation              AM-PAC OT "6 Clicks" Daily Activity     Outcome Measure Help from another person eating meals?: None Help from another person taking care of personal grooming?: A Little Help from another person toileting, which includes using toliet, bedpan, or urinal?: A Little Help from another person bathing (including washing, rinsing, drying)?: A  Little Help from another person to put on and taking off regular upper body clothing?: A Little Help from another person to put on and taking off regular lower body clothing?: A Lot 6 Click Score: 18   End of Session Equipment Utilized During Treatment: Back brace Nurse Communication: Other (comment) (Dressing changes, updates)  Activity Tolerance: Patient tolerated treatment well Patient left: in chair;with call bell/phone within reach;with family/visitor present  OT Visit Diagnosis: Unsteadiness on feet (R26.81);Other abnormalities of gait and mobility (R26.89);Muscle weakness (generalized) (M62.81)                Time: 1914-7829 OT Time Calculation (min): 18 min Charges:  OT General Charges $OT Visit: 1 Visit OT Evaluation $OT Eval Low Complexity: 1 Low  [employer], OTS

## 2023-03-10 NOTE — Discharge Instructions (Addendum)
  Your surgeon has performed an operation on your lumbar spine (low back) to relieve pressure on one or more nerves. Many times, patients feel better immediately after surgery and can "overdo it." Even if you feel well, it is important that you follow these activity guidelines. If you do not let your back heal properly from the surgery, you can increase the chance of hardware complications and/or return of your symptoms. The following are instructions to help in your recovery once you have been discharged from the hospital. Please wear brace when out of bed and ambulating. Ok to remove for sleep or when sitting  Do not use NSAIDs for 3 months after surgery.   Activity    No bending, lifting, or twisting ("BLT"). Avoid lifting objects heavier than 10 pounds (gallon milk jug).  Where possible, avoid household activities that involve lifting, bending, pushing, or pulling such as laundry, vacuuming, grocery shopping, and childcare. Try to arrange for help from friends and family for these activities while your back heals.  Increase physical activity slowly as tolerated.  Taking short walks is encouraged, but avoid strenuous exercise. Do not jog, run, bicycle, lift weights, or participate in any other exercises unless specifically allowed by your doctor. Avoid prolonged sitting, including car rides.  Talk to your doctor before resuming sexual activity.  You should not drive until cleared by your doctor.  Until released by your doctor, you should not return to work or school.  You should rest at home and let your body heal.   You may shower three days after your surgery.  After showering, lightly dab your incision dry. Do not take a tub bath or go swimming for 3 weeks, or until approved by your doctor at your follow-up appointment.  If you smoke, we strongly recommend that you quit.  Smoking has been proven to interfere with normal healing in your back and will dramatically reduce the success rate of  your surgery. Please contact [facility] (800-QUIT-NOW) and use the resources at www.[facility].com for assistance in stopping smoking.  Surgical Incision   If you have a dressing on your incision, you may remove it three days after your surgery. Keep your incision area clean and dry.  Your incision was closed with Dermabond glue. The glue should begin to peel away within about a week.  Diet            You may return to your usual diet. Be sure to stay hydrated.  When to Contact us  Although your surgery and recovery will likely be uneventful, you may have some residual numbness, aches, and pains in your back and/or legs. This is normal and should improve in the next few weeks.  However, should you experience any of the following, contact us immediately: New numbness or weakness Pain that is progressively getting worse, and is not relieved by your pain medications or rest Bleeding, redness, swelling, pain, or drainage from surgical incision Chills or flu-like symptoms Fever greater than 101.0 F (38.3 C) Problems with bowel or bladder functions Difficulty breathing or shortness of breath Warmth, tenderness, or swelling in your calf  Contact Information During office hours (Monday-Friday 9 am to 5 pm), please call your physician at [phone] and ask for Sharlot Gowda After hours and weekends, please call [phone] and speak with the neurosurgeon on call For a life-threatening emergency, call 911

## 2023-03-10 NOTE — Progress Notes (Signed)
Neurosurgery Progress Note  History: [name] is s/p L3-4 XLIF and L2-5 PSF  POD1: Pt reports pain is well-controlled and reports no overnight events. Some lateral Left lower extremity discomfort reported. She has been mobilized by PT and expresses she did well. PT would like to further assess her ability to use stairs. Pt's left dressing was changed overnight due to drainage, right dressing is currently stable. Has not yet voided.  Physical Exam: Vitals:   03/10/23 0748 03/10/23 0955  BP: (!) 147/81 (!) 142/63  Pulse: 79 81  Resp: 16   Temp: 98.8 F (37.1 C)   SpO2: 94%     AA Ox3 CNI  Strength: 4+/5 Left Hip Flexion. 5/5 throughout BLE otherwise  Incision Left Posterior Incision is CDI. Right posterior dressing shows signs of sanguinous leakage. Right flank incision c/d/i  Data:  Other tests/results: Hyperglycemia   Assessment/Plan:  [name] is s/p L3-4 XLIF and L2-5 PSF. Pt is generally doing well post-operatively.   - mobilize - pain control - DVT prophylaxis - PTOT; pt to work with PT this afternoon.  - Hyperglycemia: likely exacerbated by intraoperative steroids. Sliding Scale Insulin ordered. - continue to monitor incisions.   Manning Charity PA-C Department of Neurosurgery

## 2023-03-10 NOTE — Evaluation (Addendum)
Physical Therapy Evaluation Patient Details Name: [name] MRN: [id number] DOB: 11-26-1945 Today's Date: 03/10/2023  History of Present Illness  Pt is a 77 year old female, underwent L3-4 LATERAL LUMBAR INTERBODY FUSION and  L3-S1 INSTRUMENTATION WITH L3-4 FUSION. PMH: Amemia of chronic renal disease, arthritis, cerebral aneurysm, CKD, coronary artery disease, HTN, T2D, DDD.  Clinical Impression   Pt presents laying in bed finishing breakfast, 2/10 pain in incision site. She currently lives with her daughter in a 2nd floor apartment with 13 stairs to enter and B/L rails. PTA she uses a [facility] for ambulation and was IND with ADLs.   Pt able to perform supine>sit with minA/ cueing for hand placement, sit<>stand CGA/RW, and ambulate ~169ft CGA/RW. She also tolerated ascending/descending 5 stairs with B/L rails and CGA. PT educated on LSO wearing, BLT precautions, and safe stair negotiation during session. She exhibited intermittent impulsive tendencies/ decreased safety awareness during session. She would benefit from continued skilled therapy to maximize functional abilities.       If plan is discharge home, recommend the following: A little help with walking and/or transfers;A little help with bathing/dressing/bathroom;Help with stairs or ramp for entrance;Assist for transportation   Can travel by private vehicle        Equipment Recommendations Rolling walker (2 wheels)  Recommendations for Other Services       Functional Status Assessment Patient has had a recent decline in their functional status and demonstrates the ability to make significant improvements in function in a reasonable and predictable amount of time.     Precautions / Restrictions Precautions Precautions: Back Precaution Booklet Issued: No Precaution Comments: Pt able to recall 3/3 precautions at start of session Required Braces or Orthoses: Spinal Brace Spinal Brace:  (LSO) Restrictions Weight Bearing  Restrictions: No      Mobility  Bed Mobility Overal bed mobility: Needs Assistance Bed Mobility: Supine to Sit     Supine to sit: Min assist     General bed mobility comments: minA for completion, needing cueing for hand placement    Transfers Overall transfer level: Needs assistance Equipment used: Rolling walker (2 wheels) Transfers: Sit to/from Stand Sit to Stand: Contact guard assist           General transfer comment: CGA for safety    Ambulation/Gait Ambulation/Gait assistance: Contact guard assist Gait Distance (Feet): 180 Feet Assistive device: Rolling walker (2 wheels)   Gait velocity: decreased     General Gait Details: CGA and intermittent facilitation due to decreased awareness/impulsive tendencies. PT reinforced BLT prec during mobiltiy  Stairs Stairs: Yes Stairs assistance: Contact guard assist Stair Management: Two rails Number of Stairs: 5 General stair comments: ascend/descend  Wheelchair Mobility     Tilt Bed    Modified Rankin (Stroke Patients Only)       Balance Overall balance assessment: Needs assistance Sitting-balance support: Feet supported, Bilateral upper extremity supported Sitting balance-Leahy Scale: Good     Standing balance support: Bilateral upper extremity supported, During functional activity Standing balance-Leahy Scale: Good                               Pertinent Vitals/Pain Pain Assessment Pain Assessment: 0-10 Pain Score: 2  Pain Location: back Pain Descriptors / Indicators: Operative site guarding, Grimacing, Discomfort Pain Intervention(s): Limited activity within patient's tolerance, Monitored during session    Home Living Family/patient expects to be discharged to:: Private residence Living Arrangements: Children Available Help  at Discharge: Family Type of Home: Apartment Home Access: Stairs to enter Entrance Stairs-Rails: Can reach both Entrance Stairs-Number of Steps: 13    Home Layout: One level Home Equipment: Cane - single point      Prior Function Prior Level of Function : Independent/Modified Independent             Mobility Comments: has been using SPC ADLs Comments: IND ADLs     Extremity/Trunk Assessment   Upper Extremity Assessment Upper Extremity Assessment: Overall WFL for tasks assessed    Lower Extremity Assessment Lower Extremity Assessment: LLE deficits/detail;RLE deficits/detail;Generalized weakness RLE Sensation: WNL LLE Sensation: WNL       Communication   Communication Communication: No apparent difficulties Cueing Techniques: Verbal cues;Visual cues;Tactile cues  Cognition Arousal: Alert Behavior During Therapy: Impulsive Overall Cognitive Status: Within Functional Limits for tasks assessed                                 General Comments: intermittent impulsive tendencies and decreased safety awareness during session        General Comments General comments (skin integrity, edema, etc.): Incision dressing changed by nurse during session    Exercises Other Exercises Other Exercises: PT educated on BLT precautions and safe stair negotiation   Assessment/Plan    PT Assessment Patient needs continued PT services  PT Problem List Decreased strength;Decreased range of motion;Decreased activity tolerance;Decreased balance;Decreased mobility;Decreased knowledge of use of DME;Decreased safety awareness;Decreased knowledge of precautions       PT Treatment Interventions DME instruction;Gait training;Stair training;Functional mobility training;Therapeutic activities;Therapeutic exercise;Balance training;Neuromuscular re-education;Patient/family education    PT Goals (Current goals can be found in the Care Plan section)  Acute Rehab PT Goals Patient Stated Goal: return home PT Goal Formulation: With patient Time For Goal Achievement: 03/24/23 Potential to Achieve Goals: Good    Frequency Min  1X/week     Co-evaluation               AM-PAC PT "6 Clicks" Mobility  Outcome Measure Help needed turning from your back to your side while in a flat bed without using bedrails?: A Little Help needed moving from lying on your back to sitting on the side of a flat bed without using bedrails?: A Little Help needed moving to and from a bed to a chair (including a wheelchair)?: A Little Help needed standing up from a chair using your arms (e.g., wheelchair or bedside chair)?: A Little Help needed to walk in hospital room?: A Little Help needed climbing 3-5 steps with a railing? : A Little 6 Click Score: 18    End of Session Equipment Utilized During Treatment: Gait belt;Back brace Activity Tolerance: Patient tolerated treatment well Patient left: in chair;with call bell/phone within reach Nurse Communication: Mobility status PT Visit Diagnosis: Other abnormalities of gait and mobility (R26.89)    Time: 8657-8469 PT Time Calculation (min) (ACUTE ONLY): 32 min   Charges:   PT Evaluation $PT Eval Low Complexity: 1 Low PT Treatments $Therapeutic Activity: 23-37 mins PT General Charges $$ ACUTE PT VISIT: 1 Visit        Ryen Heitmeyer, PT, SPT 11:54 AM,03/10/23

## 2023-03-10 NOTE — Progress Notes (Signed)
PT Cancellation Note  Patient Details Name: [name] MRN: [id number] DOB: 04/17/1946   Cancelled Treatment:    Reason Eval/Treat Not Completed: Other (comment). Pt feeling nauseous at this time, PT to re-attempt as able.    Olga Coaster PT, DPT 2:17 PM,03/10/23

## 2023-03-11 ENCOUNTER — Telehealth: Payer: Self-pay | Admitting: Neurosurgery

## 2023-03-11 LAB — CBC
HCT: 23.1 % — ABNORMAL LOW (ref 36.0–46.0)
Hemoglobin: 7.6 g/dL — ABNORMAL LOW (ref 12.0–15.0)
MCH: 30.3 pg (ref 26.0–34.0)
MCHC: 32.9 g/dL (ref 30.0–36.0)
MCV: 92 fL (ref 80.0–100.0)
Platelets: 115 10*3/uL — ABNORMAL LOW (ref 150–400)
RBC: 2.51 MIL/uL — ABNORMAL LOW (ref 3.87–5.11)
RDW: 13.2 % (ref 11.5–15.5)
WBC: 7.3 10*3/uL (ref 4.0–10.5)
nRBC: 0 % (ref 0.0–0.2)

## 2023-03-11 LAB — BASIC METABOLIC PANEL
Anion gap: 8 (ref 5–15)
BUN: 64 mg/dL — ABNORMAL HIGH (ref 8–23)
CO2: 23 mmol/L (ref 22–32)
Calcium: 8.1 mg/dL — ABNORMAL LOW (ref 8.9–10.3)
Chloride: 111 mmol/L (ref 98–111)
Creatinine, Ser: 2.91 mg/dL — ABNORMAL HIGH (ref 0.44–1.00)
GFR, Estimated: 16 mL/min — ABNORMAL LOW (ref >60)
Glucose, Bld: 122 mg/dL — ABNORMAL HIGH (ref 70–99)
Potassium: 4.1 mmol/L (ref 3.5–5.1)
Sodium: 142 mmol/L (ref 135–145)

## 2023-03-11 LAB — GLUCOSE, CAPILLARY
Glucose-Capillary: 106 mg/dL — ABNORMAL HIGH (ref 70–99)
Glucose-Capillary: 205 mg/dL — ABNORMAL HIGH (ref 70–99)

## 2023-03-11 MED ORDER — METHOCARBAMOL 500 MG PO TABS: 500.0000 mg | ORAL_TABLET | Freq: Four times a day (QID) | ORAL | 0 refills | Status: DC | PRN

## 2023-03-11 MED ORDER — FUROSEMIDE 20 MG PO TABS
ORAL_TABLET | ORAL | Status: AC
  Filled 2023-03-11: qty 2

## 2023-03-11 MED ORDER — SENNA 8.6 MG PO TABS: 1.0000 | ORAL_TABLET | Freq: Every day | ORAL | 0 refills | Status: DC | PRN

## 2023-03-11 MED ORDER — SENNA 8.6 MG PO TABS
ORAL_TABLET | ORAL | Status: AC
  Filled 2023-03-11: qty 1

## 2023-03-11 MED ORDER — METHOCARBAMOL 500 MG PO TABS
ORAL_TABLET | ORAL | Status: AC
  Filled 2023-03-11: qty 1

## 2023-03-11 MED ORDER — GLIMEPIRIDE 1 MG PO TABS
ORAL_TABLET | ORAL | Status: AC
  Filled 2023-03-11: qty 2

## 2023-03-11 MED ORDER — INSULIN ASPART 100 UNIT/ML IJ SOLN
INTRAMUSCULAR | Status: AC
  Filled 2023-03-11: qty 1

## 2023-03-11 MED ORDER — DOCUSATE SODIUM 100 MG PO CAPS
ORAL_CAPSULE | ORAL | Status: AC
  Filled 2023-03-11: qty 1

## 2023-03-11 MED ORDER — LOSARTAN POTASSIUM 50 MG PO TABS
ORAL_TABLET | ORAL | Status: AC
  Filled 2023-03-11: qty 2

## 2023-03-11 MED ORDER — ACETAMINOPHEN 500 MG PO TABS
ORAL_TABLET | ORAL | Status: AC
  Filled 2023-03-11: qty 1

## 2023-03-11 MED ORDER — HYDROCODONE-ACETAMINOPHEN 5-325 MG PO TABS: 1.0000 | ORAL_TABLET | ORAL | 0 refills | Status: AC | PRN

## 2023-03-11 MED ORDER — HYDROCODONE-ACETAMINOPHEN 5-325 MG PO TABS
ORAL_TABLET | ORAL | Status: AC
  Filled 2023-03-11: qty 2

## 2023-03-11 MED ORDER — HEPARIN SODIUM (PORCINE) 5000 UNIT/ML IJ SOLN
INTRAMUSCULAR | Status: AC
  Filled 2023-03-11: qty 1

## 2023-03-11 NOTE — Care Management Important Message (Signed)
Important Message  Patient Details  Name: [name] MRN: [id number] Date of Birth: 08/18/1945   Medicare Important Message Given:  Yes  Obtained initial signature for Medicare IM.  Copy of Medicare IM given to patient for reference, original to be scanned into chart.   Johnell Comings 03/11/2023, 12:07 PM

## 2023-03-11 NOTE — Progress Notes (Signed)
Physical Therapy Treatment Patient Details Name: [name] MRN: [id number] DOB: [birth year] Today's Date: 03/11/2023   History of Present Illness Pt is a 77 year old female, underwent L3-4 LATERAL LUMBAR INTERBODY FUSION and  L3-S1 INSTRUMENTATION WITH L3-4 FUSION. PMH: Amemia of chronic renal disease, arthritis, cerebral aneurysm, CKD, coronary artery disease, HTN, T2D, DDD.    PT Comments  Pt had just finished ambulating to BR with RN staff at beginning of PT session. Author adjusted then assisted pt with proper application of LSO brace. She demonstrated safe abilities to stand and ambulate with use of RW. She also was able to safely ascend/descend stairs with supervision only. Pt is cleared from an acute PT standpoint for safe DC home with HHPT to follow. Pt endorses having 24/7 assistance available if needed.   If plan is discharge home, recommend the following: A little help with walking and/or transfers;A little help with bathing/dressing/bathroom;Help with stairs or ramp for entrance;Assist for transportation     Equipment Recommendations  Rolling walker (2 wheels)       Precautions / Restrictions Precautions Precautions: Back Required Braces or Orthoses: Spinal Brace Spinal Brace: Applied in sitting position;Other (comment) (LSO) Restrictions Weight Bearing Restrictions: No     Mobility  Bed Mobility  General bed mobility comments: pt was up with RN staff. bed mobility not formally assessed    Transfers Overall transfer level: Needs assistance Equipment used: Rolling walker (2 wheels) Transfers: Sit to/from Stand Sit to Stand: Supervision  General transfer comment: no physical assistance required to stand or sit form recliner or EOB surface. pt is slow moving due to pain but was able to perform all task desired. discussed use of LSO with ambulation/OOB activity    Ambulation/Gait Ambulation/Gait assistance: Supervision Gait Distance (Feet): 200 Feet Assistive  device: Rolling walker (2 wheels), None Gait Pattern/deviations: Step-through pattern, Decreased step length - left, Decreased step length - right  General Gait Details: pt ambulated ~ 200 ft total. no LOB noted. LSO donned throughout   Stairs Stairs: Yes Stairs assistance: Supervision Stair Management: Two rails, Alternating pattern Number of Stairs: 8 General stair comments: Pt performed ascending/descending 8 stair with supervision only. Pt remains slow moving however safe    Balance Overall balance assessment: Needs assistance Sitting-balance support: Feet supported, Bilateral upper extremity supported Sitting balance-Leahy Scale: Good     Standing balance support: Bilateral upper extremity supported, During functional activity Standing balance-Leahy Scale: Good       Cognition Arousal: Alert Behavior During Therapy: WFL for tasks assessed/performed, Flat affect Overall Cognitive Status: Within Functional Limits for tasks assessed      General Comments: Pt is A and able to consistently follow commands throughout. demonstrates no impulsivity this session.               Pertinent Vitals/Pain Pain Assessment Pain Assessment: 0-10 Pain Score: 4  Pain Location: back after mobility Pain Descriptors / Indicators: Operative site guarding, Grimacing, Discomfort Pain Intervention(s): Limited activity within patient's tolerance, Monitored during session, Premedicated before session, Repositioned     PT Goals (current goals can now be found in the care plan section) Acute Rehab PT Goals Patient Stated Goal: return home Progress towards PT goals: Progressing toward goals    Frequency    Min 1X/week       AM-PAC PT "6 Clicks" Mobility   Outcome Measure  Help needed turning from your back to your side while in a flat bed without using bedrails?: A Little Help needed moving  from lying on your back to sitting on the side of a flat bed without using bedrails?: A  Little Help needed moving to and from a bed to a chair (including a wheelchair)?: A Little Help needed standing up from a chair using your arms (e.g., wheelchair or bedside chair)?: A Little Help needed to walk in hospital room?: A Little Help needed climbing 3-5 steps with a railing? : A Little 6 Click Score: 18    End of Session Equipment Utilized During Treatment: Back brace Activity Tolerance: Patient tolerated treatment well Patient left: Other (comment) (pt requested to sit EOB for breakfast. RN staff aware pt is EOB post session) Nurse Communication: Mobility status PT Visit Diagnosis: Other abnormalities of gait and mobility (R26.89)     Time: 8413-2440 PT Time Calculation (min) (ACUTE ONLY): 18 min  Charges:    $Gait Training: 8-22 mins PT General Charges $$ ACUTE PT VISIT: 1 Visit                    Jetta Lout PTA 03/11/23, 10:04 AM

## 2023-03-11 NOTE — Progress Notes (Signed)
The patient was discharged via wheelchair by nursing staff. Patient wearing LSO brace. The patient was discharged with belongings, rolling walker and discharge instructions. The patient and spouse were given verbal discharge instructions, all questions answered.

## 2023-03-11 NOTE — Telephone Encounter (Signed)
I spoke with [facility]. She already picked up methocarbamol on 02/23/23, she cannot fill it until 03/18/23. I notified the patient of this. She confirmed she has methocarbamol at home, but did not realize she already had it.

## 2023-03-11 NOTE — Telephone Encounter (Signed)
L3-4 XLIF, removal and replacement of L3-S1 instrumentation with L3-4 fusion on 03/09/23  Patient is calling that the [facility] did not have receiver her rx for Robaxin

## 2023-03-11 NOTE — Discharge Summary (Signed)
Discharge Summary  Patient ID: MEHLANI MOCERI MRN: [id number] DOB/AGE: 02/26/1946 77 y.o.  Admit date: 03/09/2023 Discharge date: 03/11/2023  Admission Diagnoses: Lumbar adjacent segment disease with spondylolisthesis - M51.36, M43.16   Discharge Diagnoses:  Principal Problem:   S/P lumbar fusion Active Problems:   Lumbar adjacent segment disease with spondylolisthesis   Discharged Condition: good  Hospital Course:  [name] is a 77 y.o presenting with L2-3 adjacent segment disease, facet arthropathy, and severe stenosis status post L3-4 XLIF and L2-L5 posterior spinal fusion.  Her intraoperative course was complicated by left-sided L3 pedicle fracture resulting in extension of construct to L2.  Surgery was otherwise uncomplicated.  Her postoperative course was complicated by some mild nausea on postop day 1 as well as some bleeding from her posterior incisions on POD0.  This resolved.  She was seen and evaluated by therapy and deemed appropriate for discharge home on postop day 2 with home health services.  She was given prescriptions for Norco, Robaxin, and senna.  Consults: None  Significant Diagnostic Studies: labs:   Treatments: surgery: As above.  Please see separately dictated operative report for further details.  Discharge Exam: Blood pressure 108/79, pulse 75, temperature 98.6 F (37 C), temperature source Temporal, resp. rate 18, height 5\' 3"  (1.6 m), weight 80.3 kg, SpO2 94%. A&O 5/5 throughout BLE Incisions covered with clean dressings  Disposition: Discharge disposition: 06-Home-Health Care Svc       Discharge Instructions     Incentive spirometry RT   Complete by: As directed       Allergies as of 03/11/2023       Reactions   Isosorbide Dinitrate Other (See Comments)   Collapse   Amlodipine Swelling   Sulfa Antibiotics Itching   Tizanidine Hcl    Oxycodone Itching   Sulfasalazine Itching        Medication List     STOP  taking these medications    cefdinir 300 MG capsule Commonly known as: OMNICEF   clopidogrel 75 MG tablet Commonly known as: PLAVIX       TAKE these medications    allopurinol 100 MG tablet Commonly known as: ZYLOPRIM TAKE 1 TABLET BY MOUTH EVERY DAY   clotrimazole-betamethasone cream Commonly known as: LOTRISONE APPLY TWICE A DAY TO GROIN AREA SKIN FOR 1 WEEK AND THEN AS NEEDED.   fluticasone 50 MCG/ACT nasal spray Commonly known as: FLONASE SPRAY 2 SPRAYS INTO EACH NOSTRIL EVERY DAY What changed: See the new instructions.   furosemide 40 MG tablet Commonly known as: LASIX TAKE 1 TABLET BY MOUTH EVERY DAY   gabapentin 100 MG capsule Commonly known as: NEURONTIN Take 300 mg by mouth at bedtime as needed.   glimepiride 2 MG tablet Commonly known as: AMARYL Take 1 tablet (2 mg total) by mouth See admin instructions. Take 1 mg daily, may increase to 2 mg if blood sugar is over 175 What changed:  when to take this additional instructions   hydrALAZINE 100 MG tablet Commonly known as: APRESOLINE TAKE 1 TABLET BY MOUTH TWICE A DAY What changed: additional instructions   HYDROcodone-acetaminophen 5-325 MG tablet Commonly known as: NORCO/VICODIN Take 1-2 tablets by mouth every 4 (four) hours as needed for up to 5 days for moderate pain ((score 4 to 6)).   hydroxychloroquine 200 MG tablet Commonly known as: PLAQUENIL Take 200 mg by mouth daily.   labetalol 300 MG tablet Commonly known as: NORMODYNE TAKE 1 TABLET BY MOUTH TWICE A DAY   Lokelma 10  g Pack packet Generic drug: sodium zirconium cyclosilicate Take 10 g by mouth as needed.   losartan 100 MG tablet Commonly known as: COZAAR Take 1 tablet (100 mg total) by mouth every morning.   methocarbamol 500 MG tablet Commonly known as: ROBAXIN Take 1 tablet (500 mg total) by mouth every 6 (six) hours as needed for muscle spasms.   rosuvastatin 40 MG tablet Commonly known as: CRESTOR TAKE 1 TABLET BY MOUTH  EVERY DAY   senna 8.6 MG Tabs tablet Commonly known as: SENOKOT Take 1 tablet (8.6 mg total) by mouth daily as needed for mild constipation.   Symbicort 80-4.5 MCG/ACT inhaler Generic drug: budesonide-formoterol INHALE 1 PUFF BY MOUTH TWICE A DAY   tretinoin 0.025 % cream Commonly known as: RETIN-A TWICE A DAY   zolpidem 5 MG tablet Commonly known as: AMBIEN Take 1 tablet (5 mg total) by mouth at bedtime as needed for sleep.               Durable Medical Equipment  (From admission, onward)           Start     Ordered   03/10/23 1622  For home use only DME Walker rolling  Once       Question Answer Comment  Walker: With 5 Inch Wheels   Patient needs a walker to treat with the following condition Impaired mobility      03/10/23 1622   03/10/23 1613  For home use only DME Bedside commode  Once       Question:  Patient needs a bedside commode to treat with the following condition  Answer:  Impaired mobility   03/10/23 1622             Signed: Susanne Borders 03/11/2023, 9:26 AM

## 2023-03-11 NOTE — Progress Notes (Signed)
Neurosurgery Progress Note  History: [name] is s/p L3-4 XLIF and L2-5 PSF  POD2: NAEO. Pt reports some continued right sided back and buttock pain.  POD1: Pt reports pain is well-controlled and reports no overnight events. Some lateral Left lower extremity discomfort reported. She has been mobilized by PT and expresses she did well. PT would like to further assess her ability to use stairs. Pt's left dressing was changed overnight due to drainage, right dressing is currently stable. Has not yet voided.  Physical Exam: Vitals:   03/11/23 0539 03/11/23 0844  BP: (!) 129/49 108/79  Pulse: 77 75  Resp: 16 18  Temp: 98.2 F (36.8 C) 98.6 F (37 C)  SpO2: 94% 94%    AA Ox3 CNI  Strength: 5/5 throughout BLE otherwise  Incisions with clean dressings in place  Data:  Other tests/results: see labs  Assessment/Plan:  [name] is s/p L3-4 XLIF and L2-5 PSF. Pt is generally doing well post-operatively.   - mobilize - pain control - DVT prophylaxis - PTOT; pt to work with PT this afternoon.  - Hyperglycemia: likely exacerbated by intraoperative steroids. Sliding Scale Insulin ordered. Improves this morning - BP soft this am. Patient asymptomatic. Will hold home meds for now - continue to monitor incisions.  Manning Charity PA-C Department of Neurosurgery

## 2023-03-13 ENCOUNTER — Emergency Department
Admission: EM | Admit: 2023-03-13 | Discharge: 2023-03-13 | Disposition: A | Discharge status: Home / Self Care | Payer: 59 | Attending: Emergency Medicine | Admitting: Emergency Medicine

## 2023-03-13 ENCOUNTER — Other Ambulatory Visit: Payer: Self-pay

## 2023-03-13 ENCOUNTER — Emergency Department: Payer: 59

## 2023-03-13 DIAGNOSIS — I2510 Atherosclerotic heart disease of native coronary artery without angina pectoris: Secondary | ICD-10-CM | POA: Diagnosis not present

## 2023-03-13 DIAGNOSIS — K5900 Constipation, unspecified: Secondary | ICD-10-CM | POA: Diagnosis not present

## 2023-03-13 DIAGNOSIS — K5790 Diverticulosis of intestine, part unspecified, without perforation or abscess without bleeding: Secondary | ICD-10-CM | POA: Diagnosis not present

## 2023-03-13 DIAGNOSIS — K8689 Other specified diseases of pancreas: Secondary | ICD-10-CM | POA: Diagnosis not present

## 2023-03-13 DIAGNOSIS — I129 Hypertensive chronic kidney disease with stage 1 through stage 4 chronic kidney disease, or unspecified chronic kidney disease: Secondary | ICD-10-CM | POA: Insufficient documentation

## 2023-03-13 DIAGNOSIS — N281 Cyst of kidney, acquired: Secondary | ICD-10-CM | POA: Diagnosis not present

## 2023-03-13 DIAGNOSIS — N189 Chronic kidney disease, unspecified: Secondary | ICD-10-CM | POA: Insufficient documentation

## 2023-03-13 LAB — CBC WITH DIFFERENTIAL/PLATELET
Abs Immature Granulocytes: 0.04 10*3/uL (ref 0.00–0.07)
Basophils Absolute: 0 10*3/uL (ref 0.0–0.1)
Basophils Relative: 0 %
Eosinophils Absolute: 0.1 10*3/uL (ref 0.0–0.5)
Eosinophils Relative: 1 %
HCT: 26.4 % — ABNORMAL LOW (ref 36.0–46.0)
Hemoglobin: 8.4 g/dL — ABNORMAL LOW (ref 12.0–15.0)
Immature Granulocytes: 0 %
Lymphocytes Relative: 15 %
Lymphs Abs: 1.4 10*3/uL (ref 0.7–4.0)
MCH: 29.9 pg (ref 26.0–34.0)
MCHC: 31.8 g/dL (ref 30.0–36.0)
MCV: 94 fL (ref 80.0–100.0)
Monocytes Absolute: 0.6 10*3/uL (ref 0.1–1.0)
Monocytes Relative: 6 %
Neutro Abs: 7.1 10*3/uL (ref 1.7–7.7)
Neutrophils Relative %: 78 %
Platelets: 143 10*3/uL — ABNORMAL LOW (ref 150–400)
RBC: 2.81 MIL/uL — ABNORMAL LOW (ref 3.87–5.11)
RDW: 13.2 % (ref 11.5–15.5)
WBC: 9.3 10*3/uL (ref 4.0–10.5)
nRBC: 0 % (ref 0.0–0.2)

## 2023-03-13 LAB — COMPREHENSIVE METABOLIC PANEL
ALT: 18 U/L (ref 0–44)
AST: 29 U/L (ref 15–41)
Albumin: 3.2 g/dL — ABNORMAL LOW (ref 3.5–5.0)
Alkaline Phosphatase: 56 U/L (ref 38–126)
Anion gap: 9 (ref 5–15)
BUN: 54 mg/dL — ABNORMAL HIGH (ref 8–23)
CO2: 21 mmol/L — ABNORMAL LOW (ref 22–32)
Calcium: 8.7 mg/dL — ABNORMAL LOW (ref 8.9–10.3)
Chloride: 109 mmol/L (ref 98–111)
Creatinine, Ser: 2.37 mg/dL — ABNORMAL HIGH (ref 0.44–1.00)
GFR, Estimated: 21 mL/min — ABNORMAL LOW (ref >60)
Glucose, Bld: 137 mg/dL — ABNORMAL HIGH (ref 70–99)
Potassium: 4.6 mmol/L (ref 3.5–5.1)
Sodium: 139 mmol/L (ref 135–145)
Total Bilirubin: 1.1 mg/dL (ref 0.3–1.2)
Total Protein: 6.1 g/dL — ABNORMAL LOW (ref 6.5–8.1)

## 2023-03-13 LAB — LIPASE, BLOOD: Lipase: 35 U/L (ref 11–51)

## 2023-03-13 MED ORDER — ONDANSETRON HCL 4 MG/2ML IJ SOLN
4.0000 mg | Freq: Once | INTRAMUSCULAR | Status: AC
  Administered 2023-03-13: 4 mg via INTRAVENOUS
  Filled 2023-03-13: qty 2

## 2023-03-13 MED ORDER — LACTATED RINGERS IV BOLUS
1000.0000 mL | Freq: Once | INTRAVENOUS | Status: AC
  Administered 2023-03-13: 1000 mL via INTRAVENOUS

## 2023-03-13 MED ORDER — MORPHINE SULFATE (PF) 2 MG/ML IV SOLN
2.0000 mg | Freq: Once | INTRAVENOUS | Status: AC
  Administered 2023-03-13: 2 mg via INTRAVENOUS
  Filled 2023-03-13: qty 1

## 2023-03-13 NOTE — ED Provider Notes (Signed)
[facility] Provider Note    Event Date/Time   First MD Initiated Contact with Patient 03/13/23 0701     (approximate)   History   Chief Complaint Constipation   HPI  SONAL DEWITZ is a 77 y.o. female with past medical history of hypertension, CAD, CKD, return arthritis, and lupus who presents to the ED complaining of constipation.  Patient reports that she has not had a bowel movement in about the past week, is now dealing with increasing pain in her abdomen over the past 24 hours.  She describes the pain as diffuse with some distention, also reports feeling nauseous but has not vomited.  She denies any difficulty urinating and has not had any fevers or flank pain.     Physical Exam   Triage Vital Signs: ED Triage Vitals  Encounter Vitals Group     BP 03/13/23 0604 (!) 176/83     Systolic BP Percentile --      Diastolic BP Percentile --      Pulse Rate 03/13/23 0604 82     Resp 03/13/23 0604 18     Temp 03/13/23 0604 98.7 F (37.1 C)     Temp Source 03/13/23 0604 Oral     SpO2 03/13/23 0604 94 %     Weight --      Height --      Head Circumference --      Peak Flow --      Pain Score 03/13/23 0602 8     Pain Loc --      Pain Education --      Exclude from Growth Chart --     Most recent vital signs: Vitals:   03/13/23 0830 03/13/23 0930  BP: (!) 173/77 (!) 149/71  Pulse: 94 91  Resp:    Temp:    SpO2:      Constitutional: Alert and oriented. Eyes: Conjunctivae are normal. Head: Atraumatic. Nose: No congestion/rhinnorhea. Mouth/Throat: Mucous membranes are moist.  Cardiovascular: Normal rate, regular rhythm. Grossly normal heart sounds.  2+ radial pulses bilaterally. Respiratory: Normal respiratory effort.  No retractions. Lungs CTAB. Gastrointestinal: Soft and diffusely tender to palpation with no distention.  Abdominal surgical sites well-healed with no erythema, edema, warmth, or drainage. Musculoskeletal: No lower  extremity tenderness nor edema.  Lumbar spinal surgical sites well-healed with no erythema, edema, warmth, or drainage. Neurologic:  Normal speech and language. No gross focal neurologic deficits are appreciated.    ED Results / Procedures / Treatments   Labs (all labs ordered are listed, but only abnormal results are displayed) Labs Reviewed  CBC WITH DIFFERENTIAL/PLATELET - Abnormal; Notable for the following components:      Result Value   RBC 2.81 (*)    Hemoglobin 8.4 (*)    HCT 26.4 (*)    Platelets 143 (*)    All other components within normal limits  COMPREHENSIVE METABOLIC PANEL - Abnormal; Notable for the following components:   CO2 21 (*)    Glucose, Bld 137 (*)    BUN 54 (*)    Creatinine, Ser 2.37 (*)    Calcium 8.7 (*)    Total Protein 6.1 (*)    Albumin 3.2 (*)    GFR, Estimated 21 (*)    All other components within normal limits  LIPASE, BLOOD  URINALYSIS, ROUTINE W REFLEX MICROSCOPIC    RADIOLOGY CT abdomen/pelvis reviewed and interpreted by me with no dilated bowel loops, focal fluid collections, or inflammatory changes.  PROCEDURES:  Critical Care performed: No  Procedures   MEDICATIONS ORDERED IN ED: Medications  ondansetron (ZOFRAN) injection 4 mg (4 mg Intravenous Given 03/13/23 0807)  morphine (PF) 2 MG/ML injection 2 mg (2 mg Intravenous Given 03/13/23 0807)  lactated ringers bolus 1,000 mL (1,000 mLs Intravenous New Bag/Given 03/13/23 0810)     IMPRESSION / MDM / ASSESSMENT AND PLAN / ED COURSE  I reviewed the triage vital signs and the nursing notes.                              77 y.o. female with past medical history of hypertension, CAD, CKD, lupus, and rheumatoid arthritis who presents to the ED complaining of constipation with increasing abdominal pain following recent spinal surgery.  Patient's presentation is most consistent with acute presentation with potential threat to life or bodily function.  Differential diagnosis  includes, but is not limited to, bowel obstruction, abscess, dehydration, electrolyte abnormality, AKI, UTI, constipation.  Patient well-appearing and in no acute distress, vital signs are unremarkable.  Her abdomen is soft but diffusely tender to palpation, no distention noted.  Surgical sites to her abdomen as well as her lumbar spine appear to be well-healed with no signs of infection.  We will further assess with CT imaging and labs, treat symptomatically with IV morphine and Zofran, hydrate with IV fluids.  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  Patient states that she is aware of this and is working on follow-up, will refer to oncology for possible tissue sampling.  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  CT imaging also shows L3 compression fracture, however this is covered by her recent fusion.  Rectal exam without fecal impaction, patient counseled on over-the-counter management of constipation.  She was counseled to follow-up with her surgeon and to return to the ED for new or worsening symptoms, patient agrees with plan.      FINAL CLINICAL IMPRESSION(S) / ED DIAGNOSES   Final diagnoses:  Constipation, unspecified constipation type  Pancreatic mass     Rx / DC Orders   ED Discharge Orders     None        Note:  This document was prepared using Dragon voice recognition software and may include unintentional dictation errors.   Chesley Noon, MD 03/13/23 1006

## 2023-03-13 NOTE — Discharge Instructions (Signed)
You were seen in the emergency department today for constipation.  We recommend that you use one or more of the following over-the-counter medications in the order described:   1)  Colace (or Dulcolax) 100 mg:  This is a stool softener, and you may take it once or twice a day as needed. 2)  Senna tablets:  This is a bowel stimulant that will help "push" out your stool. It is the next step to add after you have tried a stool softener. 3)  Miralax (powder):  This medication works by drawing additional fluid into your intestines and helps to flush out your stool.  Mix the powder with water or juice according to label instructions.  It may help if the Colace and Senna are not sufficient, but you must be sure to use the recommended amount of water or juice when you mix up the powder. 4)  Look for magnesium citrate at the pharmacy (it is usually a small glass bottle).  Drink the bottle according to the label instructions.  Remember that narcotic pain medications are constipating, so avoid them or minimize their use.  Drink plenty of fluids.  Please return to the Emergency Department immediately if you develop new or worsening symptoms that concern you, such as (but not limited to) fever > 101 degrees, severe abdominal pain, or persistent vomiting.

## 2023-03-13 NOTE — ED Notes (Signed)
Patient transported to CT 

## 2023-03-13 NOTE — ED Notes (Signed)
Chaperoned MD for rectal exam. Pt tolerated without complications.

## 2023-03-13 NOTE — ED Triage Notes (Signed)
Pt reports constipation with last BM 6 days ago and abdominal pain beginning tonight. Pt reports recent spinal surgery on 9/18 and has been taking stool softeners and laxatives without relief.

## 2023-03-14 ENCOUNTER — Telehealth: Payer: Self-pay | Admitting: Neurosurgery

## 2023-03-14 DIAGNOSIS — Z981 Arthrodesis status: Secondary | ICD-10-CM

## 2023-03-14 DIAGNOSIS — M5136 Other intervertebral disc degeneration, lumbar region: Secondary | ICD-10-CM

## 2023-03-14 NOTE — Telephone Encounter (Signed)
New home health referral for [facility] has been placed.  Patty, please fax her PT note and discharge summary with the referral to [facility].

## 2023-03-14 NOTE — Telephone Encounter (Signed)
I spoke with [name]. She states she does not want to move forward with home health through [facility], as she didn't like the questions they were asking her (she felt they were too personal and were not relevant to her condition). She has requested a new referral to [facility] (ph [phone]) and confirms that she has verified that they accept her insurance.  We also discussed her ER visit for constipation yesterday. She has since had a bowel movement.   She asked if it is normal to still have back and leg pain at this point. I explained that it is not unusual to have good days and bad days just a few days out from surgery.

## 2023-03-14 NOTE — Telephone Encounter (Signed)
Patient called to state that [facility] followed up with her on Saturday but she did not like their evaluation so she did not sign off. Just wanted to inform the provider.

## 2023-03-14 NOTE — Telephone Encounter (Signed)
Left message to return call 

## 2023-03-14 NOTE — Telephone Encounter (Signed)
Referral faxed

## 2023-03-15 ENCOUNTER — Ambulatory Visit: Payer: Self-pay

## 2023-03-15 ENCOUNTER — Telehealth: Payer: Self-pay | Admitting: Internal Medicine

## 2023-03-15 NOTE — Patient Instructions (Addendum)
Visit Information  Thank you for taking time to visit with me today. Please don't hesitate to contact me if I can be of assistance to you.   Following are the goals we discussed today:   Goals Addressed             This Visit's Progress    Patient Stated:  " I want to manage my arthritis pain and health conditions"       Interventions Today    Flowsheet Row Most Recent Value  Chronic Disease   Chronic disease during today's visit Other  [status post lumbar laminectomy, constipation]  General Interventions   General Interventions Discussed/Reviewed General Interventions Reviewed, Doctor Visits  [evaluation of current treatment plan for post lumbar laminectomy surgery and patients adherence to plan as established by provider.  Assessed  pain level. Reviewed hospital after visit summary with patient.]  Doctor Visits Discussed/Reviewed Doctor Visits Reviewed  [facility] patient has post hospital follow up visit scheduled and transportation to appointments.]  Education Interventions   Education Provided Provided Education  [reviewed signs/ symptoms of infection. Advised to increase water intake, eat high fiber foods, take prescribed laxative.  Advised to notify provider for ongoing constipation]  Provided Verbal Education On Other  [facility] contact phone number given to patient.  Advised patient to contact RNCM if she doesn't hear from home health  or services do not start.]  Nutrition Interventions   Nutrition Discussed/Reviewed Nutrition Reviewed, Adding fruits and vegetables  Pharmacy Interventions   Pharmacy Dicussed/Reviewed Pharmacy Topics Reviewed  Algis Downs to call surgeon's office to confirm when to restart Plavix.]              Our next appointment is by telephone on 03/25/23 at 2:30pm   Please call the care guide team at [phone] if you need to cancel or reschedule your appointment.   If you are experiencing a Mental Health or Behavioral Health Crisis or need someone to  talk to, please call the Suicide and Crisis Lifeline: 988 call 1-800-273-TALK (toll free, 24 hour hotline)  Patient verbalizes understanding of instructions and care plan provided today and agrees to view in MyChart. Active MyChart status and patient understanding of how to access instructions and care plan via MyChart confirmed with patient.     George Ina RN,BSN,CCM [facility] Care Coordination [phone] direct line

## 2023-03-15 NOTE — Patient Outreach (Signed)
Care Coordination   Follow Up Visit Note   03/15/2023 Name: [name] MRN: [id number] DOB: [birth year]  [name] is a 77 y.o. year old female who sees Margaretann Loveless, MD for primary care. I spoke with  [name] by phone today.  What matters to the patients health and wellness today?  Per chart review patient status post lumber fusion surgery on 03/09/23 and ED visit on 03/11/23 due to constipation.  Patient reports her pain level is a 7/8 today. She denies signs/ symptoms of infection from incision site.  Patient states she is getting around slowly still requiring assistance to sit up and/ or stand up.  She reports her daughter is assisting her with her care.  Patient reports having constipation. She states she is taking Senna laxative as prescribed.  Patient reports she is scheduled for a follow up visit with the surgeon on 03/24/23.   Goals Addressed             This Visit's Progress    Patient Stated:  " I want to manage my arthritis pain and health conditions"       Interventions Today    Flowsheet Row Most Recent Value  Chronic Disease   Chronic disease during today's visit Other  [status post lumbar laminectomy, constipation]  General Interventions   General Interventions Discussed/Reviewed General Interventions Reviewed, Doctor Visits  [evaluation of current treatment plan for post lumbar laminectomy surgery and patients adherence to plan as established by provider.  Assessed  pain level. Reviewed hospital after visit summary with patient.]  Doctor Visits Discussed/Reviewed Doctor Visits Reviewed  [facility] patient has post hospital follow up visit scheduled and transportation to appointments.]  Education Interventions   Education Provided Provided Education  [reviewed signs/ symptoms of infection. Advised to increase water intake, eat high fiber foods, take prescribed laxative.  Advised to notify provider for ongoing constipation]  Provided Verbal  Education On Other  [facility] contact phone number given to patient.  Advised patient to contact RNCM if she doesn't hear from home health  or services do not start.]  Nutrition Interventions   Nutrition Discussed/Reviewed Nutrition Reviewed, Adding fruits and vegetables  Pharmacy Interventions   Pharmacy Dicussed/Reviewed Pharmacy Topics Reviewed  Algis Downs to call surgeon's office to confirm when to restart Plavix.]              SDOH assessments and interventions completed:  No     Care Coordination Interventions:  Yes, provided   Follow up plan: Follow up call scheduled for 03/25/23    Encounter Outcome:  Patient Visit Completed   George Ina RN,BSN,CCM [facility] Care Coordination [phone] direct line

## 2023-03-15 NOTE — Telephone Encounter (Signed)
Patient left VM wanting to cancel her upcoming appt and wants someone to call her when they do so.

## 2023-03-17 ENCOUNTER — Encounter: Payer: 59 | Admitting: Neurosurgery

## 2023-03-17 DIAGNOSIS — E119 Type 2 diabetes mellitus without complications: Secondary | ICD-10-CM | POA: Diagnosis not present

## 2023-03-17 NOTE — Telephone Encounter (Signed)
Patient has been notified

## 2023-03-17 NOTE — Telephone Encounter (Signed)
I spoke w/ the case manager and she is going to see if [facility] would accept her

## 2023-03-17 NOTE — Telephone Encounter (Signed)
Patient calling that [facility] can not take her on as a patient. Where else can you refer her to?

## 2023-03-17 NOTE — Telephone Encounter (Signed)
Patient is aware that [facility] has accepted her as a patient.

## 2023-03-17 NOTE — Addendum Note (Signed)
Addended by: Sharlot Gowda on: 03/17/2023 12:15 PM   Modules accepted: Orders

## 2023-03-17 NOTE — Telephone Encounter (Signed)
I sent a community message through Epic to [employer] w/ [facility]

## 2023-03-17 NOTE — Telephone Encounter (Signed)
Per Barbara Cower with [facility], they can accept the patient and start her care 03/22/23.

## 2023-03-20 NOTE — Progress Notes (Unsigned)
   REFERRING PHYSICIAN:  Margaretann Loveless Md [address] [place],  [place] [address]  DOS: 03/09/23  XLIF L3-L4 with PSF L2-L5  HISTORY OF PRESENT ILLNESS: [name] is approximately 2 weeks status post above surgery. Was given norco and robaxin on discharge from the hospital.   Was seen in ED on 03/13/23 for constipation. Had CT of abdomen done.   She continues with LBP with tingling. She notes some right leg groin and thigh pain. Constipation is resolved.   She is taking neurontin, robaxin, and norco. Needs refills on norco and neurontin. Has been on neurontin 300mg  q hs.   She has not restarted her PLAVIX.    PHYSICAL EXAMINATION:  General: Patient is well developed, well nourished, calm, collected, and in no apparent distress.   NEUROLOGICAL:  General: In no acute distress.   Awake, alert, oriented to person, place, and time.  Pupils equal round and reactive to light.  Facial tone is symmetric.     Strength:          Side Iliopsoas Quads Hamstring PF DF EHL  R 5 5 5 5 5 5   L 5 5 5 5 5 5    Incisions c/d/I  No weakness, but she has pain with testing of iliopsoas on right.    ROS (Neurologic):  Negative except as noted above  IMAGING: Nothing new to review.   ASSESSMENT/PLAN:  [name] is doing reasonable s/p above surgery. Treatment options reviewed with patient and following plan made:   - I have advised the patient to lift up to 10 pounds until 6 weeks after surgery (follow up with Dr. Myer Haff).  - Reviewed wound care.  - No bending, twisting, or lifting.  - Continue on current medications including prn hydrocodone, prn robaxin, and neurontin.  - Refill of hydrocodone sent to pharmacy. PMP reviewed and is appropriate.  - Refill of neurontin sent to pharmacy as well. - Okay to restart her PLAVIX.   - Follow up as scheduled in 4 weeks and prn.   Advised to contact the office if any questions or concerns arise.  Drake Leach  PA-C Department of neurosurgery

## 2023-03-22 DIAGNOSIS — E785 Hyperlipidemia, unspecified: Secondary | ICD-10-CM | POA: Diagnosis not present

## 2023-03-22 DIAGNOSIS — I130 Hypertensive heart and chronic kidney disease with heart failure and stage 1 through stage 4 chronic kidney disease, or unspecified chronic kidney disease: Secondary | ICD-10-CM | POA: Diagnosis not present

## 2023-03-22 DIAGNOSIS — R011 Cardiac murmur, unspecified: Secondary | ICD-10-CM | POA: Diagnosis not present

## 2023-03-22 DIAGNOSIS — D631 Anemia in chronic kidney disease: Secondary | ICD-10-CM | POA: Diagnosis not present

## 2023-03-22 DIAGNOSIS — M4316 Spondylolisthesis, lumbar region: Secondary | ICD-10-CM | POA: Diagnosis not present

## 2023-03-22 DIAGNOSIS — M069 Rheumatoid arthritis, unspecified: Secondary | ICD-10-CM | POA: Diagnosis not present

## 2023-03-22 DIAGNOSIS — N189 Chronic kidney disease, unspecified: Secondary | ICD-10-CM | POA: Diagnosis not present

## 2023-03-22 DIAGNOSIS — E213 Hyperparathyroidism, unspecified: Secondary | ICD-10-CM | POA: Diagnosis not present

## 2023-03-22 DIAGNOSIS — Z7951 Long term (current) use of inhaled steroids: Secondary | ICD-10-CM | POA: Diagnosis not present

## 2023-03-22 DIAGNOSIS — E1151 Type 2 diabetes mellitus with diabetic peripheral angiopathy without gangrene: Secondary | ICD-10-CM | POA: Diagnosis not present

## 2023-03-22 DIAGNOSIS — M5030 Other cervical disc degeneration, unspecified cervical region: Secondary | ICD-10-CM | POA: Diagnosis not present

## 2023-03-22 DIAGNOSIS — M51369 Other intervertebral disc degeneration, lumbar region without mention of lumbar back pain or lower extremity pain: Secondary | ICD-10-CM | POA: Diagnosis not present

## 2023-03-22 DIAGNOSIS — M329 Systemic lupus erythematosus, unspecified: Secondary | ICD-10-CM | POA: Diagnosis not present

## 2023-03-22 DIAGNOSIS — E1122 Type 2 diabetes mellitus with diabetic chronic kidney disease: Secondary | ICD-10-CM | POA: Diagnosis not present

## 2023-03-22 DIAGNOSIS — M1990 Unspecified osteoarthritis, unspecified site: Secondary | ICD-10-CM | POA: Diagnosis not present

## 2023-03-22 DIAGNOSIS — Z7984 Long term (current) use of oral hypoglycemic drugs: Secondary | ICD-10-CM | POA: Diagnosis not present

## 2023-03-22 DIAGNOSIS — I509 Heart failure, unspecified: Secondary | ICD-10-CM | POA: Diagnosis not present

## 2023-03-22 DIAGNOSIS — M48061 Spinal stenosis, lumbar region without neurogenic claudication: Secondary | ICD-10-CM | POA: Diagnosis not present

## 2023-03-22 DIAGNOSIS — E875 Hyperkalemia: Secondary | ICD-10-CM | POA: Diagnosis not present

## 2023-03-22 DIAGNOSIS — Z981 Arthrodesis status: Secondary | ICD-10-CM | POA: Diagnosis not present

## 2023-03-22 DIAGNOSIS — Z8673 Personal history of transient ischemic attack (TIA), and cerebral infarction without residual deficits: Secondary | ICD-10-CM | POA: Diagnosis not present

## 2023-03-22 DIAGNOSIS — I2510 Atherosclerotic heart disease of native coronary artery without angina pectoris: Secondary | ICD-10-CM | POA: Diagnosis not present

## 2023-03-22 DIAGNOSIS — Z4789 Encounter for other orthopedic aftercare: Secondary | ICD-10-CM | POA: Diagnosis not present

## 2023-03-22 DIAGNOSIS — M109 Gout, unspecified: Secondary | ICD-10-CM | POA: Diagnosis not present

## 2023-03-22 DIAGNOSIS — Z79899 Other long term (current) drug therapy: Secondary | ICD-10-CM | POA: Diagnosis not present

## 2023-03-24 ENCOUNTER — Ambulatory Visit: Payer: 59 | Admitting: Orthopedic Surgery

## 2023-03-24 ENCOUNTER — Encounter: Payer: Self-pay | Admitting: Orthopedic Surgery

## 2023-03-24 ENCOUNTER — Telehealth: Payer: Self-pay | Admitting: Neurosurgery

## 2023-03-24 VITALS — BP 138/78 | Ht 64.0 in | Wt 177.0 lb

## 2023-03-24 DIAGNOSIS — M51369 Other intervertebral disc degeneration, lumbar region without mention of lumbar back pain or lower extremity pain: Secondary | ICD-10-CM

## 2023-03-24 DIAGNOSIS — Z09 Encounter for follow-up examination after completed treatment for conditions other than malignant neoplasm: Secondary | ICD-10-CM

## 2023-03-24 DIAGNOSIS — Z981 Arthrodesis status: Secondary | ICD-10-CM

## 2023-03-24 DIAGNOSIS — M4316 Spondylolisthesis, lumbar region: Secondary | ICD-10-CM

## 2023-03-24 MED ORDER — GABAPENTIN 300 MG PO CAPS: 300.0000 mg | ORAL_CAPSULE | Freq: Every day | ORAL | 0 refills | Status: DC

## 2023-03-24 MED ORDER — HYDROCODONE-ACETAMINOPHEN 5-325 MG PO TABS: 1.0000 | ORAL_TABLET | Freq: Four times a day (QID) | ORAL | 0 refills | Status: DC | PRN

## 2023-03-24 NOTE — Telephone Encounter (Signed)
Cindy, from [facility] is calling to obtain verbal orders for PT 2x3 and 1x6 and also like to request an OT evaluation.

## 2023-03-24 NOTE — Patient Instructions (Signed)
It was nice to see you today.   I am glad that you are feeling better!  Okay to get incision wet in the shower, do not submerge in pool or hot tub.   Call if any concerns about the incision such as redness, drainage, or fever/chills.   No bending, twisting, or lifting. You can lift up to 10 pounds until your follow up with Dr.  Myer Haff in 4 weeks.   I sent a refill of hydrocodone to your pharmacy. Continue to take the least amount needed and only take for severe pain. Remember this medication can make you sleepy and/or constipated.   I sent a refill of gabapentin to your pharmacy as well- this is a 300mg  pill so you take only one at night.   You can restart your PLAVIX.   We will see you back in 4 weeks for your 6 weeks postop visit.   Please call with any questions or concerns.   Drake Leach PA-C [phone]

## 2023-03-24 NOTE — Telephone Encounter (Signed)
Called LVM with Arline Asp, from [facility] is calling to obtain verbal orders for PT 2x3 and 1x6 and also like to request an OT evaluation.

## 2023-03-26 DIAGNOSIS — M48061 Spinal stenosis, lumbar region without neurogenic claudication: Secondary | ICD-10-CM | POA: Diagnosis not present

## 2023-03-26 DIAGNOSIS — M5030 Other cervical disc degeneration, unspecified cervical region: Secondary | ICD-10-CM | POA: Diagnosis not present

## 2023-03-26 DIAGNOSIS — M069 Rheumatoid arthritis, unspecified: Secondary | ICD-10-CM | POA: Diagnosis not present

## 2023-03-26 DIAGNOSIS — M51369 Other intervertebral disc degeneration, lumbar region without mention of lumbar back pain or lower extremity pain: Secondary | ICD-10-CM | POA: Diagnosis not present

## 2023-03-26 DIAGNOSIS — M4316 Spondylolisthesis, lumbar region: Secondary | ICD-10-CM | POA: Diagnosis not present

## 2023-03-26 DIAGNOSIS — Z981 Arthrodesis status: Secondary | ICD-10-CM | POA: Diagnosis not present

## 2023-03-26 DIAGNOSIS — I130 Hypertensive heart and chronic kidney disease with heart failure and stage 1 through stage 4 chronic kidney disease, or unspecified chronic kidney disease: Secondary | ICD-10-CM | POA: Diagnosis not present

## 2023-03-26 DIAGNOSIS — M1990 Unspecified osteoarthritis, unspecified site: Secondary | ICD-10-CM | POA: Diagnosis not present

## 2023-03-26 DIAGNOSIS — E875 Hyperkalemia: Secondary | ICD-10-CM | POA: Diagnosis not present

## 2023-03-26 DIAGNOSIS — E785 Hyperlipidemia, unspecified: Secondary | ICD-10-CM | POA: Diagnosis not present

## 2023-03-26 DIAGNOSIS — E1151 Type 2 diabetes mellitus with diabetic peripheral angiopathy without gangrene: Secondary | ICD-10-CM | POA: Diagnosis not present

## 2023-03-26 DIAGNOSIS — I2510 Atherosclerotic heart disease of native coronary artery without angina pectoris: Secondary | ICD-10-CM | POA: Diagnosis not present

## 2023-03-26 DIAGNOSIS — Z4789 Encounter for other orthopedic aftercare: Secondary | ICD-10-CM | POA: Diagnosis not present

## 2023-03-26 DIAGNOSIS — M109 Gout, unspecified: Secondary | ICD-10-CM | POA: Diagnosis not present

## 2023-03-26 DIAGNOSIS — N189 Chronic kidney disease, unspecified: Secondary | ICD-10-CM | POA: Diagnosis not present

## 2023-03-26 DIAGNOSIS — Z79899 Other long term (current) drug therapy: Secondary | ICD-10-CM | POA: Diagnosis not present

## 2023-03-26 DIAGNOSIS — E1122 Type 2 diabetes mellitus with diabetic chronic kidney disease: Secondary | ICD-10-CM | POA: Diagnosis not present

## 2023-03-26 DIAGNOSIS — D631 Anemia in chronic kidney disease: Secondary | ICD-10-CM | POA: Diagnosis not present

## 2023-03-26 DIAGNOSIS — R011 Cardiac murmur, unspecified: Secondary | ICD-10-CM | POA: Diagnosis not present

## 2023-03-26 DIAGNOSIS — Z7951 Long term (current) use of inhaled steroids: Secondary | ICD-10-CM | POA: Diagnosis not present

## 2023-03-26 DIAGNOSIS — E213 Hyperparathyroidism, unspecified: Secondary | ICD-10-CM | POA: Diagnosis not present

## 2023-03-26 DIAGNOSIS — M329 Systemic lupus erythematosus, unspecified: Secondary | ICD-10-CM | POA: Diagnosis not present

## 2023-03-26 DIAGNOSIS — Z7984 Long term (current) use of oral hypoglycemic drugs: Secondary | ICD-10-CM | POA: Diagnosis not present

## 2023-03-26 DIAGNOSIS — I509 Heart failure, unspecified: Secondary | ICD-10-CM | POA: Diagnosis not present

## 2023-03-26 DIAGNOSIS — Z8673 Personal history of transient ischemic attack (TIA), and cerebral infarction without residual deficits: Secondary | ICD-10-CM | POA: Diagnosis not present

## 2023-03-28 DIAGNOSIS — I2510 Atherosclerotic heart disease of native coronary artery without angina pectoris: Secondary | ICD-10-CM | POA: Diagnosis not present

## 2023-03-28 DIAGNOSIS — M329 Systemic lupus erythematosus, unspecified: Secondary | ICD-10-CM | POA: Diagnosis not present

## 2023-03-28 DIAGNOSIS — Z7951 Long term (current) use of inhaled steroids: Secondary | ICD-10-CM | POA: Diagnosis not present

## 2023-03-28 DIAGNOSIS — Z4789 Encounter for other orthopedic aftercare: Secondary | ICD-10-CM | POA: Diagnosis not present

## 2023-03-28 DIAGNOSIS — M5030 Other cervical disc degeneration, unspecified cervical region: Secondary | ICD-10-CM | POA: Diagnosis not present

## 2023-03-28 DIAGNOSIS — Z8673 Personal history of transient ischemic attack (TIA), and cerebral infarction without residual deficits: Secondary | ICD-10-CM | POA: Diagnosis not present

## 2023-03-28 DIAGNOSIS — N189 Chronic kidney disease, unspecified: Secondary | ICD-10-CM | POA: Diagnosis not present

## 2023-03-28 DIAGNOSIS — M48061 Spinal stenosis, lumbar region without neurogenic claudication: Secondary | ICD-10-CM | POA: Diagnosis not present

## 2023-03-28 DIAGNOSIS — E213 Hyperparathyroidism, unspecified: Secondary | ICD-10-CM | POA: Diagnosis not present

## 2023-03-28 DIAGNOSIS — E875 Hyperkalemia: Secondary | ICD-10-CM | POA: Diagnosis not present

## 2023-03-28 DIAGNOSIS — E1151 Type 2 diabetes mellitus with diabetic peripheral angiopathy without gangrene: Secondary | ICD-10-CM | POA: Diagnosis not present

## 2023-03-28 DIAGNOSIS — Z981 Arthrodesis status: Secondary | ICD-10-CM | POA: Diagnosis not present

## 2023-03-28 DIAGNOSIS — M109 Gout, unspecified: Secondary | ICD-10-CM | POA: Diagnosis not present

## 2023-03-28 DIAGNOSIS — I130 Hypertensive heart and chronic kidney disease with heart failure and stage 1 through stage 4 chronic kidney disease, or unspecified chronic kidney disease: Secondary | ICD-10-CM | POA: Diagnosis not present

## 2023-03-28 DIAGNOSIS — E1122 Type 2 diabetes mellitus with diabetic chronic kidney disease: Secondary | ICD-10-CM | POA: Diagnosis not present

## 2023-03-28 DIAGNOSIS — M069 Rheumatoid arthritis, unspecified: Secondary | ICD-10-CM | POA: Diagnosis not present

## 2023-03-28 DIAGNOSIS — M1990 Unspecified osteoarthritis, unspecified site: Secondary | ICD-10-CM | POA: Diagnosis not present

## 2023-03-28 DIAGNOSIS — I509 Heart failure, unspecified: Secondary | ICD-10-CM | POA: Diagnosis not present

## 2023-03-28 DIAGNOSIS — M4316 Spondylolisthesis, lumbar region: Secondary | ICD-10-CM | POA: Diagnosis not present

## 2023-03-28 DIAGNOSIS — D631 Anemia in chronic kidney disease: Secondary | ICD-10-CM | POA: Diagnosis not present

## 2023-03-28 DIAGNOSIS — Z79899 Other long term (current) drug therapy: Secondary | ICD-10-CM | POA: Diagnosis not present

## 2023-03-28 DIAGNOSIS — Z7984 Long term (current) use of oral hypoglycemic drugs: Secondary | ICD-10-CM | POA: Diagnosis not present

## 2023-03-28 DIAGNOSIS — M51369 Other intervertebral disc degeneration, lumbar region without mention of lumbar back pain or lower extremity pain: Secondary | ICD-10-CM | POA: Diagnosis not present

## 2023-03-28 DIAGNOSIS — R011 Cardiac murmur, unspecified: Secondary | ICD-10-CM | POA: Diagnosis not present

## 2023-03-28 DIAGNOSIS — E785 Hyperlipidemia, unspecified: Secondary | ICD-10-CM | POA: Diagnosis not present

## 2023-03-30 ENCOUNTER — Telehealth: Payer: Self-pay | Admitting: Neurosurgery

## 2023-03-30 ENCOUNTER — Encounter: Payer: Self-pay | Admitting: Family Medicine

## 2023-03-30 DIAGNOSIS — M4316 Spondylolisthesis, lumbar region: Secondary | ICD-10-CM | POA: Diagnosis not present

## 2023-03-30 DIAGNOSIS — M5030 Other cervical disc degeneration, unspecified cervical region: Secondary | ICD-10-CM | POA: Diagnosis not present

## 2023-03-30 DIAGNOSIS — R011 Cardiac murmur, unspecified: Secondary | ICD-10-CM | POA: Diagnosis not present

## 2023-03-30 DIAGNOSIS — D631 Anemia in chronic kidney disease: Secondary | ICD-10-CM | POA: Diagnosis not present

## 2023-03-30 DIAGNOSIS — E1151 Type 2 diabetes mellitus with diabetic peripheral angiopathy without gangrene: Secondary | ICD-10-CM | POA: Diagnosis not present

## 2023-03-30 DIAGNOSIS — I130 Hypertensive heart and chronic kidney disease with heart failure and stage 1 through stage 4 chronic kidney disease, or unspecified chronic kidney disease: Secondary | ICD-10-CM | POA: Diagnosis not present

## 2023-03-30 DIAGNOSIS — M48061 Spinal stenosis, lumbar region without neurogenic claudication: Secondary | ICD-10-CM | POA: Diagnosis not present

## 2023-03-30 DIAGNOSIS — E785 Hyperlipidemia, unspecified: Secondary | ICD-10-CM | POA: Diagnosis not present

## 2023-03-30 DIAGNOSIS — Z4789 Encounter for other orthopedic aftercare: Secondary | ICD-10-CM | POA: Diagnosis not present

## 2023-03-30 DIAGNOSIS — E1122 Type 2 diabetes mellitus with diabetic chronic kidney disease: Secondary | ICD-10-CM | POA: Diagnosis not present

## 2023-03-30 DIAGNOSIS — Z7951 Long term (current) use of inhaled steroids: Secondary | ICD-10-CM | POA: Diagnosis not present

## 2023-03-30 DIAGNOSIS — I2510 Atherosclerotic heart disease of native coronary artery without angina pectoris: Secondary | ICD-10-CM | POA: Diagnosis not present

## 2023-03-30 DIAGNOSIS — Z8673 Personal history of transient ischemic attack (TIA), and cerebral infarction without residual deficits: Secondary | ICD-10-CM | POA: Diagnosis not present

## 2023-03-30 DIAGNOSIS — N189 Chronic kidney disease, unspecified: Secondary | ICD-10-CM | POA: Diagnosis not present

## 2023-03-30 DIAGNOSIS — M51369 Other intervertebral disc degeneration, lumbar region without mention of lumbar back pain or lower extremity pain: Secondary | ICD-10-CM | POA: Diagnosis not present

## 2023-03-30 DIAGNOSIS — M329 Systemic lupus erythematosus, unspecified: Secondary | ICD-10-CM | POA: Diagnosis not present

## 2023-03-30 DIAGNOSIS — M109 Gout, unspecified: Secondary | ICD-10-CM | POA: Diagnosis not present

## 2023-03-30 DIAGNOSIS — E875 Hyperkalemia: Secondary | ICD-10-CM | POA: Diagnosis not present

## 2023-03-30 DIAGNOSIS — E213 Hyperparathyroidism, unspecified: Secondary | ICD-10-CM | POA: Diagnosis not present

## 2023-03-30 DIAGNOSIS — M1990 Unspecified osteoarthritis, unspecified site: Secondary | ICD-10-CM | POA: Diagnosis not present

## 2023-03-30 DIAGNOSIS — Z79899 Other long term (current) drug therapy: Secondary | ICD-10-CM | POA: Diagnosis not present

## 2023-03-30 DIAGNOSIS — I509 Heart failure, unspecified: Secondary | ICD-10-CM | POA: Diagnosis not present

## 2023-03-30 DIAGNOSIS — Z7984 Long term (current) use of oral hypoglycemic drugs: Secondary | ICD-10-CM | POA: Diagnosis not present

## 2023-03-30 DIAGNOSIS — M069 Rheumatoid arthritis, unspecified: Secondary | ICD-10-CM | POA: Diagnosis not present

## 2023-03-30 DIAGNOSIS — Z981 Arthrodesis status: Secondary | ICD-10-CM | POA: Diagnosis not present

## 2023-03-30 NOTE — Telephone Encounter (Signed)
LMOM informed patient the note she requested is ready for pick up.

## 2023-03-30 NOTE — Telephone Encounter (Signed)
Letter is fine with me.

## 2023-03-30 NOTE — Telephone Encounter (Signed)
Patient is calling to request a letter for her apartment complex stating that she needs to be on the lower floor of her apartment building and that she also needs a two bedroom apartment to be able to accommodate the aid that is helping her as she recovers from surgery. She states that she would like a call once the letter is ready and she will have her daughter come by and pick it up.   Apartment Coordinator: Annabell Howells

## 2023-03-31 ENCOUNTER — Ambulatory Visit: Payer: 59 | Admitting: Internal Medicine

## 2023-03-31 DIAGNOSIS — E875 Hyperkalemia: Secondary | ICD-10-CM | POA: Diagnosis not present

## 2023-03-31 DIAGNOSIS — M329 Systemic lupus erythematosus, unspecified: Secondary | ICD-10-CM | POA: Diagnosis not present

## 2023-03-31 DIAGNOSIS — Z981 Arthrodesis status: Secondary | ICD-10-CM | POA: Diagnosis not present

## 2023-03-31 DIAGNOSIS — Z79899 Other long term (current) drug therapy: Secondary | ICD-10-CM | POA: Diagnosis not present

## 2023-03-31 DIAGNOSIS — Z4789 Encounter for other orthopedic aftercare: Secondary | ICD-10-CM | POA: Diagnosis not present

## 2023-03-31 DIAGNOSIS — E1122 Type 2 diabetes mellitus with diabetic chronic kidney disease: Secondary | ICD-10-CM | POA: Diagnosis not present

## 2023-03-31 DIAGNOSIS — I130 Hypertensive heart and chronic kidney disease with heart failure and stage 1 through stage 4 chronic kidney disease, or unspecified chronic kidney disease: Secondary | ICD-10-CM | POA: Diagnosis not present

## 2023-03-31 DIAGNOSIS — Z7951 Long term (current) use of inhaled steroids: Secondary | ICD-10-CM | POA: Diagnosis not present

## 2023-03-31 DIAGNOSIS — M5030 Other cervical disc degeneration, unspecified cervical region: Secondary | ICD-10-CM | POA: Diagnosis not present

## 2023-03-31 DIAGNOSIS — Z8673 Personal history of transient ischemic attack (TIA), and cerebral infarction without residual deficits: Secondary | ICD-10-CM | POA: Diagnosis not present

## 2023-03-31 DIAGNOSIS — I2510 Atherosclerotic heart disease of native coronary artery without angina pectoris: Secondary | ICD-10-CM | POA: Diagnosis not present

## 2023-03-31 DIAGNOSIS — M1990 Unspecified osteoarthritis, unspecified site: Secondary | ICD-10-CM | POA: Diagnosis not present

## 2023-03-31 DIAGNOSIS — I509 Heart failure, unspecified: Secondary | ICD-10-CM | POA: Diagnosis not present

## 2023-03-31 DIAGNOSIS — E1151 Type 2 diabetes mellitus with diabetic peripheral angiopathy without gangrene: Secondary | ICD-10-CM | POA: Diagnosis not present

## 2023-03-31 DIAGNOSIS — N189 Chronic kidney disease, unspecified: Secondary | ICD-10-CM | POA: Diagnosis not present

## 2023-03-31 DIAGNOSIS — M48061 Spinal stenosis, lumbar region without neurogenic claudication: Secondary | ICD-10-CM | POA: Diagnosis not present

## 2023-03-31 DIAGNOSIS — M069 Rheumatoid arthritis, unspecified: Secondary | ICD-10-CM | POA: Diagnosis not present

## 2023-03-31 DIAGNOSIS — M109 Gout, unspecified: Secondary | ICD-10-CM | POA: Diagnosis not present

## 2023-03-31 DIAGNOSIS — Z7984 Long term (current) use of oral hypoglycemic drugs: Secondary | ICD-10-CM | POA: Diagnosis not present

## 2023-03-31 DIAGNOSIS — M51369 Other intervertebral disc degeneration, lumbar region without mention of lumbar back pain or lower extremity pain: Secondary | ICD-10-CM | POA: Diagnosis not present

## 2023-03-31 DIAGNOSIS — E785 Hyperlipidemia, unspecified: Secondary | ICD-10-CM | POA: Diagnosis not present

## 2023-03-31 DIAGNOSIS — M4316 Spondylolisthesis, lumbar region: Secondary | ICD-10-CM | POA: Diagnosis not present

## 2023-03-31 DIAGNOSIS — D631 Anemia in chronic kidney disease: Secondary | ICD-10-CM | POA: Diagnosis not present

## 2023-03-31 DIAGNOSIS — R011 Cardiac murmur, unspecified: Secondary | ICD-10-CM | POA: Diagnosis not present

## 2023-03-31 DIAGNOSIS — E213 Hyperparathyroidism, unspecified: Secondary | ICD-10-CM | POA: Diagnosis not present

## 2023-04-01 ENCOUNTER — Ambulatory Visit: Payer: Self-pay

## 2023-04-01 NOTE — Patient Outreach (Signed)
Care Coordination   Follow Up Visit Note   04/01/2023 Name: [name] MRN: [id number] DOB: May 25, 1946  [name] is a 77 y.o. year old female who sees Margaretann Loveless, MD for primary care. I spoke with  [name] by phone today.  What matters to the patients health and wellness today?  Patient reports ongoing surgical pain. She reports pain level is a 5 now on her pain medication.  She states she is able to get up on her own from sitting and laying down however still needs assistance getting up from shower chair. Patient reports having follow up with neurosurgery on 03/24/23 and reports ongoing HH occupational therapy.   Patient reports constipation has resolved.       Goals Addressed             This Visit's Progress    Patient Stated:  " I want to manage my arthritis pain and health conditions"       Interventions Today    Flowsheet Row Most Recent Value  Chronic Disease   Chronic disease during today's visit Other  [status post lumbar laminectomy, constipation]  General Interventions   General Interventions Discussed/Reviewed General Interventions Reviewed, Doctor Visits  [evaluation of current treatment plan for mentioned health conditions and patients adherence to plan as established by provider. Assessed patient pain level and ongoing constipation symptoms.]  Doctor Visits Discussed/Reviewed Doctor Visits Reviewed  [name] upcoming provider visits. Advised to keep follow up visit with provider as recommended.]  Exercise Interventions   Exercise Discussed/Reviewed Physical Activity  [Assessed patients current physical activity level. Assessed for ongoing [facility] services.]  Education Interventions   Education Provided --  [Advised to notify provider for increase/ severe pain. Advised no bending, twisting, heavy lifting as instructed by provider.]  Pharmacy Interventions   Pharmacy Dicussed/Reviewed Pharmacy Topics Reviewed  Algis Downs to take  medications as prescribed. Confirmed patient restarted PLAVIX.]              SDOH assessments and interventions completed:  No     Care Coordination Interventions:  Yes, provided   Follow up plan: Follow up call scheduled for 05/17/23    Encounter Outcome:  Patient Visit Completed   George Ina RN,BSN,CCM [facility] Care Coordination [phone] direct line

## 2023-04-01 NOTE — Patient Instructions (Signed)
Visit Information  Thank you for taking time to visit with me today. Please don't hesitate to contact me if I can be of assistance to you.   Following are the goals we discussed today:   Goals Addressed             This Visit's Progress    Patient Stated:  " I want to manage my arthritis pain and health conditions"       Interventions Today    Flowsheet Row Most Recent Value  Chronic Disease   Chronic disease during today's visit Other  [status post lumbar laminectomy, constipation]  General Interventions   General Interventions Discussed/Reviewed General Interventions Reviewed, Doctor Visits  [evaluation of current treatment plan for mentioned health conditions and patients adherence to plan as established by provider. Assessed patient pain level and ongoing constipation symptoms.]  Doctor Visits Discussed/Reviewed Doctor Visits Reviewed  [facility] upcoming provider visits. Advised to keep follow up visit with provider as recommended.]  Exercise Interventions   Exercise Discussed/Reviewed Physical Activity  [Assessed patients current physical activity level. Assessed for ongoing [facility] services.]  Education Interventions   Education Provided --  [Advised to notify provider for increase/ severe pain. Advised no bending, twisting, heavy lifting as instructed by provider.]  Pharmacy Interventions   Pharmacy Dicussed/Reviewed Pharmacy Topics Reviewed  Algis Downs to take medications as prescribed. Confirmed patient restarted PLAVIX.]              Our next appointment is by telephone on 05/17/23 at 2:30 pm  Please call the care guide team at [phone] if you need to cancel or reschedule your appointment.   If you are experiencing a Mental Health or Behavioral Health Crisis or need someone to talk to, please call the Suicide and Crisis Lifeline: 988 call 1-800-273-TALK (toll free, 24 hour hotline)  Patient verbalizes understanding of instructions and care plan provided today and  agrees to view in MyChart. Active MyChart status and patient understanding of how to access instructions and care plan via MyChart confirmed with patient.     George Ina RN,BSN,CCM [facility] Care Coordination [phone] direct line

## 2023-04-04 ENCOUNTER — Telehealth: Payer: Self-pay | Admitting: Orthopedic Surgery

## 2023-04-04 DIAGNOSIS — M48061 Spinal stenosis, lumbar region without neurogenic claudication: Secondary | ICD-10-CM | POA: Diagnosis not present

## 2023-04-04 DIAGNOSIS — I509 Heart failure, unspecified: Secondary | ICD-10-CM | POA: Diagnosis not present

## 2023-04-04 DIAGNOSIS — E1151 Type 2 diabetes mellitus with diabetic peripheral angiopathy without gangrene: Secondary | ICD-10-CM | POA: Diagnosis not present

## 2023-04-04 DIAGNOSIS — M109 Gout, unspecified: Secondary | ICD-10-CM | POA: Diagnosis not present

## 2023-04-04 DIAGNOSIS — Z981 Arthrodesis status: Secondary | ICD-10-CM | POA: Diagnosis not present

## 2023-04-04 DIAGNOSIS — M1990 Unspecified osteoarthritis, unspecified site: Secondary | ICD-10-CM | POA: Diagnosis not present

## 2023-04-04 DIAGNOSIS — N189 Chronic kidney disease, unspecified: Secondary | ICD-10-CM | POA: Diagnosis not present

## 2023-04-04 DIAGNOSIS — M51369 Other intervertebral disc degeneration, lumbar region without mention of lumbar back pain or lower extremity pain: Secondary | ICD-10-CM | POA: Diagnosis not present

## 2023-04-04 DIAGNOSIS — Z8673 Personal history of transient ischemic attack (TIA), and cerebral infarction without residual deficits: Secondary | ICD-10-CM | POA: Diagnosis not present

## 2023-04-04 DIAGNOSIS — D631 Anemia in chronic kidney disease: Secondary | ICD-10-CM | POA: Diagnosis not present

## 2023-04-04 DIAGNOSIS — M329 Systemic lupus erythematosus, unspecified: Secondary | ICD-10-CM | POA: Diagnosis not present

## 2023-04-04 DIAGNOSIS — I130 Hypertensive heart and chronic kidney disease with heart failure and stage 1 through stage 4 chronic kidney disease, or unspecified chronic kidney disease: Secondary | ICD-10-CM | POA: Diagnosis not present

## 2023-04-04 DIAGNOSIS — Z4789 Encounter for other orthopedic aftercare: Secondary | ICD-10-CM | POA: Diagnosis not present

## 2023-04-04 DIAGNOSIS — E875 Hyperkalemia: Secondary | ICD-10-CM | POA: Diagnosis not present

## 2023-04-04 DIAGNOSIS — Z7984 Long term (current) use of oral hypoglycemic drugs: Secondary | ICD-10-CM | POA: Diagnosis not present

## 2023-04-04 DIAGNOSIS — M5030 Other cervical disc degeneration, unspecified cervical region: Secondary | ICD-10-CM | POA: Diagnosis not present

## 2023-04-04 DIAGNOSIS — E785 Hyperlipidemia, unspecified: Secondary | ICD-10-CM | POA: Diagnosis not present

## 2023-04-04 DIAGNOSIS — I2510 Atherosclerotic heart disease of native coronary artery without angina pectoris: Secondary | ICD-10-CM | POA: Diagnosis not present

## 2023-04-04 DIAGNOSIS — Z79899 Other long term (current) drug therapy: Secondary | ICD-10-CM | POA: Diagnosis not present

## 2023-04-04 DIAGNOSIS — Z7951 Long term (current) use of inhaled steroids: Secondary | ICD-10-CM | POA: Diagnosis not present

## 2023-04-04 DIAGNOSIS — M069 Rheumatoid arthritis, unspecified: Secondary | ICD-10-CM | POA: Diagnosis not present

## 2023-04-04 DIAGNOSIS — E1122 Type 2 diabetes mellitus with diabetic chronic kidney disease: Secondary | ICD-10-CM | POA: Diagnosis not present

## 2023-04-04 DIAGNOSIS — R011 Cardiac murmur, unspecified: Secondary | ICD-10-CM | POA: Diagnosis not present

## 2023-04-04 DIAGNOSIS — E213 Hyperparathyroidism, unspecified: Secondary | ICD-10-CM | POA: Diagnosis not present

## 2023-04-04 DIAGNOSIS — M4316 Spondylolisthesis, lumbar region: Secondary | ICD-10-CM | POA: Diagnosis not present

## 2023-04-04 NOTE — Telephone Encounter (Signed)
I spoke with [facility] and they show that the methocarbamol was picked up 03/19/23

## 2023-04-04 NOTE — Telephone Encounter (Signed)
I spoke with patient and she found the Methocarbamol. She will take it as needed.

## 2023-04-04 NOTE — Telephone Encounter (Signed)
I spoke with Ms Vokes. I reviewed that Stacy's note mentions norco/hydrocodone (for pain), methocarbamol/robaxin (muscle relaxer), gabapentin/neurontin (for nerve pain). She reports that she never received methocarbamol/robaxin.   I will call [facility] to check on this.

## 2023-04-04 NOTE — Telephone Encounter (Signed)
Patient has called with concerns about her medications. Stated when she saw Stacy last, she did discuss 3 medications. Hydrocodone, Gabapentin, and another medication for her nerves. Just wanted clarity of everything she should be taking, please advise.

## 2023-04-07 DIAGNOSIS — M4316 Spondylolisthesis, lumbar region: Secondary | ICD-10-CM | POA: Diagnosis not present

## 2023-04-07 DIAGNOSIS — M069 Rheumatoid arthritis, unspecified: Secondary | ICD-10-CM | POA: Diagnosis not present

## 2023-04-07 DIAGNOSIS — Z4789 Encounter for other orthopedic aftercare: Secondary | ICD-10-CM | POA: Diagnosis not present

## 2023-04-07 DIAGNOSIS — E785 Hyperlipidemia, unspecified: Secondary | ICD-10-CM | POA: Diagnosis not present

## 2023-04-07 DIAGNOSIS — R011 Cardiac murmur, unspecified: Secondary | ICD-10-CM | POA: Diagnosis not present

## 2023-04-07 DIAGNOSIS — M48061 Spinal stenosis, lumbar region without neurogenic claudication: Secondary | ICD-10-CM | POA: Diagnosis not present

## 2023-04-07 DIAGNOSIS — M5030 Other cervical disc degeneration, unspecified cervical region: Secondary | ICD-10-CM | POA: Diagnosis not present

## 2023-04-07 DIAGNOSIS — E1151 Type 2 diabetes mellitus with diabetic peripheral angiopathy without gangrene: Secondary | ICD-10-CM | POA: Diagnosis not present

## 2023-04-07 DIAGNOSIS — M329 Systemic lupus erythematosus, unspecified: Secondary | ICD-10-CM | POA: Diagnosis not present

## 2023-04-07 DIAGNOSIS — M1990 Unspecified osteoarthritis, unspecified site: Secondary | ICD-10-CM | POA: Diagnosis not present

## 2023-04-07 DIAGNOSIS — I2510 Atherosclerotic heart disease of native coronary artery without angina pectoris: Secondary | ICD-10-CM | POA: Diagnosis not present

## 2023-04-07 DIAGNOSIS — M51369 Other intervertebral disc degeneration, lumbar region without mention of lumbar back pain or lower extremity pain: Secondary | ICD-10-CM | POA: Diagnosis not present

## 2023-04-07 DIAGNOSIS — D631 Anemia in chronic kidney disease: Secondary | ICD-10-CM | POA: Diagnosis not present

## 2023-04-07 DIAGNOSIS — I130 Hypertensive heart and chronic kidney disease with heart failure and stage 1 through stage 4 chronic kidney disease, or unspecified chronic kidney disease: Secondary | ICD-10-CM | POA: Diagnosis not present

## 2023-04-07 DIAGNOSIS — Z8673 Personal history of transient ischemic attack (TIA), and cerebral infarction without residual deficits: Secondary | ICD-10-CM | POA: Diagnosis not present

## 2023-04-07 DIAGNOSIS — Z7951 Long term (current) use of inhaled steroids: Secondary | ICD-10-CM | POA: Diagnosis not present

## 2023-04-07 DIAGNOSIS — Z981 Arthrodesis status: Secondary | ICD-10-CM | POA: Diagnosis not present

## 2023-04-07 DIAGNOSIS — E875 Hyperkalemia: Secondary | ICD-10-CM | POA: Diagnosis not present

## 2023-04-07 DIAGNOSIS — Z7984 Long term (current) use of oral hypoglycemic drugs: Secondary | ICD-10-CM | POA: Diagnosis not present

## 2023-04-07 DIAGNOSIS — M109 Gout, unspecified: Secondary | ICD-10-CM | POA: Diagnosis not present

## 2023-04-07 DIAGNOSIS — E213 Hyperparathyroidism, unspecified: Secondary | ICD-10-CM | POA: Diagnosis not present

## 2023-04-07 DIAGNOSIS — Z79899 Other long term (current) drug therapy: Secondary | ICD-10-CM | POA: Diagnosis not present

## 2023-04-07 DIAGNOSIS — E1122 Type 2 diabetes mellitus with diabetic chronic kidney disease: Secondary | ICD-10-CM | POA: Diagnosis not present

## 2023-04-07 DIAGNOSIS — N189 Chronic kidney disease, unspecified: Secondary | ICD-10-CM | POA: Diagnosis not present

## 2023-04-07 DIAGNOSIS — I509 Heart failure, unspecified: Secondary | ICD-10-CM | POA: Diagnosis not present

## 2023-04-08 ENCOUNTER — Other Ambulatory Visit: Payer: Self-pay | Admitting: Orthopedic Surgery

## 2023-04-08 ENCOUNTER — Other Ambulatory Visit: Payer: Self-pay | Admitting: Neurosurgery

## 2023-04-08 ENCOUNTER — Other Ambulatory Visit: Payer: Self-pay | Admitting: Internal Medicine

## 2023-04-08 ENCOUNTER — Other Ambulatory Visit: Payer: Self-pay | Admitting: Cardiovascular Disease

## 2023-04-08 DIAGNOSIS — M51369 Other intervertebral disc degeneration, lumbar region without mention of lumbar back pain or lower extremity pain: Secondary | ICD-10-CM

## 2023-04-08 DIAGNOSIS — Z981 Arthrodesis status: Secondary | ICD-10-CM

## 2023-04-08 DIAGNOSIS — I10 Essential (primary) hypertension: Secondary | ICD-10-CM

## 2023-04-08 DIAGNOSIS — E782 Mixed hyperlipidemia: Secondary | ICD-10-CM

## 2023-04-08 NOTE — Telephone Encounter (Signed)
DOS: 03/09/23  XLIF L3-L4 with PSF L2-L5   Refill of robaxin sent to pharmacy. Please let her know.

## 2023-04-08 NOTE — Telephone Encounter (Signed)
03/09/23  XLIF L3-L4 with PSF L2-L5   Refill request for neurontin. She was given a one month supply on 03/24/23. She should not be out yet. Please let her know.

## 2023-04-08 NOTE — Telephone Encounter (Signed)
Patient aware.

## 2023-04-10 ENCOUNTER — Other Ambulatory Visit: Payer: Self-pay | Admitting: Internal Medicine

## 2023-04-12 DIAGNOSIS — M51369 Other intervertebral disc degeneration, lumbar region without mention of lumbar back pain or lower extremity pain: Secondary | ICD-10-CM | POA: Diagnosis not present

## 2023-04-12 DIAGNOSIS — E213 Hyperparathyroidism, unspecified: Secondary | ICD-10-CM | POA: Diagnosis not present

## 2023-04-12 DIAGNOSIS — M329 Systemic lupus erythematosus, unspecified: Secondary | ICD-10-CM | POA: Diagnosis not present

## 2023-04-12 DIAGNOSIS — E875 Hyperkalemia: Secondary | ICD-10-CM | POA: Diagnosis not present

## 2023-04-12 DIAGNOSIS — Z7984 Long term (current) use of oral hypoglycemic drugs: Secondary | ICD-10-CM | POA: Diagnosis not present

## 2023-04-12 DIAGNOSIS — M4316 Spondylolisthesis, lumbar region: Secondary | ICD-10-CM | POA: Diagnosis not present

## 2023-04-12 DIAGNOSIS — M48061 Spinal stenosis, lumbar region without neurogenic claudication: Secondary | ICD-10-CM | POA: Diagnosis not present

## 2023-04-12 DIAGNOSIS — E1122 Type 2 diabetes mellitus with diabetic chronic kidney disease: Secondary | ICD-10-CM | POA: Diagnosis not present

## 2023-04-12 DIAGNOSIS — M1990 Unspecified osteoarthritis, unspecified site: Secondary | ICD-10-CM | POA: Diagnosis not present

## 2023-04-12 DIAGNOSIS — Z4789 Encounter for other orthopedic aftercare: Secondary | ICD-10-CM | POA: Diagnosis not present

## 2023-04-12 DIAGNOSIS — Z79899 Other long term (current) drug therapy: Secondary | ICD-10-CM | POA: Diagnosis not present

## 2023-04-12 DIAGNOSIS — E1151 Type 2 diabetes mellitus with diabetic peripheral angiopathy without gangrene: Secondary | ICD-10-CM | POA: Diagnosis not present

## 2023-04-12 DIAGNOSIS — I2510 Atherosclerotic heart disease of native coronary artery without angina pectoris: Secondary | ICD-10-CM | POA: Diagnosis not present

## 2023-04-12 DIAGNOSIS — Z981 Arthrodesis status: Secondary | ICD-10-CM | POA: Diagnosis not present

## 2023-04-12 DIAGNOSIS — M5030 Other cervical disc degeneration, unspecified cervical region: Secondary | ICD-10-CM | POA: Diagnosis not present

## 2023-04-12 DIAGNOSIS — E785 Hyperlipidemia, unspecified: Secondary | ICD-10-CM | POA: Diagnosis not present

## 2023-04-12 DIAGNOSIS — Z7951 Long term (current) use of inhaled steroids: Secondary | ICD-10-CM | POA: Diagnosis not present

## 2023-04-12 DIAGNOSIS — I509 Heart failure, unspecified: Secondary | ICD-10-CM | POA: Diagnosis not present

## 2023-04-12 DIAGNOSIS — N189 Chronic kidney disease, unspecified: Secondary | ICD-10-CM | POA: Diagnosis not present

## 2023-04-12 DIAGNOSIS — R011 Cardiac murmur, unspecified: Secondary | ICD-10-CM | POA: Diagnosis not present

## 2023-04-12 DIAGNOSIS — I130 Hypertensive heart and chronic kidney disease with heart failure and stage 1 through stage 4 chronic kidney disease, or unspecified chronic kidney disease: Secondary | ICD-10-CM | POA: Diagnosis not present

## 2023-04-12 DIAGNOSIS — D631 Anemia in chronic kidney disease: Secondary | ICD-10-CM | POA: Diagnosis not present

## 2023-04-12 DIAGNOSIS — M069 Rheumatoid arthritis, unspecified: Secondary | ICD-10-CM | POA: Diagnosis not present

## 2023-04-12 DIAGNOSIS — Z8673 Personal history of transient ischemic attack (TIA), and cerebral infarction without residual deficits: Secondary | ICD-10-CM | POA: Diagnosis not present

## 2023-04-12 DIAGNOSIS — M109 Gout, unspecified: Secondary | ICD-10-CM | POA: Diagnosis not present

## 2023-04-15 ENCOUNTER — Other Ambulatory Visit: Payer: Self-pay | Admitting: Internal Medicine

## 2023-04-20 ENCOUNTER — Ambulatory Visit
Admission: RE | Admit: 2023-04-20 | Discharge: 2023-04-20 | Disposition: A | Discharge status: Home / Self Care | Payer: 59 | Attending: Orthopedic Surgery | Admitting: Orthopedic Surgery

## 2023-04-20 ENCOUNTER — Telehealth: Payer: Self-pay | Admitting: Internal Medicine

## 2023-04-20 ENCOUNTER — Ambulatory Visit
Admission: RE | Admit: 2023-04-20 | Discharge: 2023-04-20 | Disposition: A | Discharge status: Home / Self Care | Payer: 59 | Source: Ambulatory Visit | Attending: Orthopedic Surgery | Admitting: Orthopedic Surgery

## 2023-04-20 ENCOUNTER — Other Ambulatory Visit: Payer: Self-pay | Admitting: Family Medicine

## 2023-04-20 DIAGNOSIS — M4316 Spondylolisthesis, lumbar region: Secondary | ICD-10-CM | POA: Insufficient documentation

## 2023-04-20 DIAGNOSIS — E875 Hyperkalemia: Secondary | ICD-10-CM | POA: Diagnosis not present

## 2023-04-20 DIAGNOSIS — I130 Hypertensive heart and chronic kidney disease with heart failure and stage 1 through stage 4 chronic kidney disease, or unspecified chronic kidney disease: Secondary | ICD-10-CM | POA: Diagnosis not present

## 2023-04-20 DIAGNOSIS — D631 Anemia in chronic kidney disease: Secondary | ICD-10-CM | POA: Diagnosis not present

## 2023-04-20 DIAGNOSIS — E1151 Type 2 diabetes mellitus with diabetic peripheral angiopathy without gangrene: Secondary | ICD-10-CM | POA: Diagnosis not present

## 2023-04-20 DIAGNOSIS — M48061 Spinal stenosis, lumbar region without neurogenic claudication: Secondary | ICD-10-CM | POA: Diagnosis not present

## 2023-04-20 DIAGNOSIS — I2510 Atherosclerotic heart disease of native coronary artery without angina pectoris: Secondary | ICD-10-CM | POA: Diagnosis not present

## 2023-04-20 DIAGNOSIS — E785 Hyperlipidemia, unspecified: Secondary | ICD-10-CM | POA: Diagnosis not present

## 2023-04-20 DIAGNOSIS — Z4789 Encounter for other orthopedic aftercare: Secondary | ICD-10-CM | POA: Diagnosis not present

## 2023-04-20 DIAGNOSIS — M47816 Spondylosis without myelopathy or radiculopathy, lumbar region: Secondary | ICD-10-CM | POA: Diagnosis not present

## 2023-04-20 DIAGNOSIS — M1990 Unspecified osteoarthritis, unspecified site: Secondary | ICD-10-CM | POA: Diagnosis not present

## 2023-04-20 DIAGNOSIS — R011 Cardiac murmur, unspecified: Secondary | ICD-10-CM | POA: Diagnosis not present

## 2023-04-20 DIAGNOSIS — M51369 Other intervertebral disc degeneration, lumbar region without mention of lumbar back pain or lower extremity pain: Secondary | ICD-10-CM

## 2023-04-20 DIAGNOSIS — N189 Chronic kidney disease, unspecified: Secondary | ICD-10-CM | POA: Diagnosis not present

## 2023-04-20 DIAGNOSIS — M329 Systemic lupus erythematosus, unspecified: Secondary | ICD-10-CM | POA: Diagnosis not present

## 2023-04-20 DIAGNOSIS — Z7984 Long term (current) use of oral hypoglycemic drugs: Secondary | ICD-10-CM | POA: Diagnosis not present

## 2023-04-20 DIAGNOSIS — M109 Gout, unspecified: Secondary | ICD-10-CM | POA: Diagnosis not present

## 2023-04-20 DIAGNOSIS — M5030 Other cervical disc degeneration, unspecified cervical region: Secondary | ICD-10-CM | POA: Diagnosis not present

## 2023-04-20 DIAGNOSIS — E213 Hyperparathyroidism, unspecified: Secondary | ICD-10-CM | POA: Diagnosis not present

## 2023-04-20 DIAGNOSIS — Z981 Arthrodesis status: Secondary | ICD-10-CM | POA: Diagnosis not present

## 2023-04-20 DIAGNOSIS — Z79899 Other long term (current) drug therapy: Secondary | ICD-10-CM | POA: Diagnosis not present

## 2023-04-20 DIAGNOSIS — M069 Rheumatoid arthritis, unspecified: Secondary | ICD-10-CM | POA: Diagnosis not present

## 2023-04-20 DIAGNOSIS — Z8673 Personal history of transient ischemic attack (TIA), and cerebral infarction without residual deficits: Secondary | ICD-10-CM | POA: Diagnosis not present

## 2023-04-20 DIAGNOSIS — I509 Heart failure, unspecified: Secondary | ICD-10-CM | POA: Diagnosis not present

## 2023-04-20 DIAGNOSIS — Z7951 Long term (current) use of inhaled steroids: Secondary | ICD-10-CM | POA: Diagnosis not present

## 2023-04-20 DIAGNOSIS — M419 Scoliosis, unspecified: Secondary | ICD-10-CM | POA: Diagnosis not present

## 2023-04-20 DIAGNOSIS — E1122 Type 2 diabetes mellitus with diabetic chronic kidney disease: Secondary | ICD-10-CM | POA: Diagnosis not present

## 2023-04-20 NOTE — Telephone Encounter (Signed)
Patient left VM wanting to know why her zolpidem has been getting denied for a refill. It looks like Dr. Welton Flakes was refusing it saying that it was too early to fill but now it is due and since Dr. Welton Flakes is out of the office I sent the refill request to [facility].  Patient notified.

## 2023-04-21 ENCOUNTER — Encounter: Payer: Self-pay | Admitting: Neurosurgery

## 2023-04-21 ENCOUNTER — Ambulatory Visit (INDEPENDENT_AMBULATORY_CARE_PROVIDER_SITE_OTHER): Payer: 59 | Admitting: Neurosurgery

## 2023-04-21 VITALS — BP 134/80 | Temp 98.0°F | Ht 64.0 in | Wt 177.0 lb

## 2023-04-21 DIAGNOSIS — M4316 Spondylolisthesis, lumbar region: Secondary | ICD-10-CM

## 2023-04-21 DIAGNOSIS — M51369 Other intervertebral disc degeneration, lumbar region without mention of lumbar back pain or lower extremity pain: Secondary | ICD-10-CM

## 2023-04-21 DIAGNOSIS — Z981 Arthrodesis status: Secondary | ICD-10-CM

## 2023-04-21 MED ORDER — ZOLPIDEM TARTRATE 5 MG PO TABS: 5.0000 mg | ORAL_TABLET | Freq: Every evening | ORAL | 1 refills | Status: DC | PRN

## 2023-04-21 MED ORDER — TRAMADOL HCL 50 MG PO TABS: 50.0000 mg | ORAL_TABLET | Freq: Four times a day (QID) | ORAL | 0 refills | Status: DC | PRN

## 2023-04-21 NOTE — Progress Notes (Signed)
   REFERRING PHYSICIAN:  No referring provider defined for this encounter.  DOS: 03/09/23  XLIF L3-L4 with PSF L2-L5  HISTORY OF PRESENT ILLNESS: [name] is status post above surgery.   She continues to have significant pain in her back and legs.  Her pain is primarily in the back of her legs and buttock.  Is unclear when this started, as she was doing reasonably well before.    PHYSICAL EXAMINATION:  General: Patient is well developed, well nourished, calm, collected, and in no apparent distress.   NEUROLOGICAL:  General: In no acute distress.   Awake, alert, oriented to person, place, and time.  Pupils equal round and reactive to light.  Facial tone is symmetric.     Strength:          Side Iliopsoas Quads Hamstring PF DF EHL  R 5 5 5 5 5 5   L 5 5 5 5 5 5    Incisions c/d/I  No weakness, but she has pain with testing of iliopsoas on right.    ROS (Neurologic):  Negative except as noted above  IMAGING: No complications noted  ASSESSMENT/PLAN:  [name] is doing poorly.  She has had a worsening of her pain.  Will continue physical therapy for now.  If it is not improved at her next follow-up, we will reevaluate her condition with a CT scan and an MRI scan of the lumbar spine.    Venetia Night MD Department of neurosurgery

## 2023-04-26 ENCOUNTER — Encounter: Payer: 59 | Admitting: Orthopedic Surgery

## 2023-04-27 DIAGNOSIS — M51369 Other intervertebral disc degeneration, lumbar region without mention of lumbar back pain or lower extremity pain: Secondary | ICD-10-CM | POA: Diagnosis not present

## 2023-04-27 DIAGNOSIS — M1990 Unspecified osteoarthritis, unspecified site: Secondary | ICD-10-CM | POA: Diagnosis not present

## 2023-04-27 DIAGNOSIS — Z8673 Personal history of transient ischemic attack (TIA), and cerebral infarction without residual deficits: Secondary | ICD-10-CM | POA: Diagnosis not present

## 2023-04-27 DIAGNOSIS — E1151 Type 2 diabetes mellitus with diabetic peripheral angiopathy without gangrene: Secondary | ICD-10-CM | POA: Diagnosis not present

## 2023-04-27 DIAGNOSIS — I130 Hypertensive heart and chronic kidney disease with heart failure and stage 1 through stage 4 chronic kidney disease, or unspecified chronic kidney disease: Secondary | ICD-10-CM | POA: Diagnosis not present

## 2023-04-27 DIAGNOSIS — E1122 Type 2 diabetes mellitus with diabetic chronic kidney disease: Secondary | ICD-10-CM | POA: Diagnosis not present

## 2023-04-27 DIAGNOSIS — Z79899 Other long term (current) drug therapy: Secondary | ICD-10-CM | POA: Diagnosis not present

## 2023-04-27 DIAGNOSIS — R011 Cardiac murmur, unspecified: Secondary | ICD-10-CM | POA: Diagnosis not present

## 2023-04-27 DIAGNOSIS — M5030 Other cervical disc degeneration, unspecified cervical region: Secondary | ICD-10-CM | POA: Diagnosis not present

## 2023-04-27 DIAGNOSIS — E785 Hyperlipidemia, unspecified: Secondary | ICD-10-CM | POA: Diagnosis not present

## 2023-04-27 DIAGNOSIS — D631 Anemia in chronic kidney disease: Secondary | ICD-10-CM | POA: Diagnosis not present

## 2023-04-27 DIAGNOSIS — Z7951 Long term (current) use of inhaled steroids: Secondary | ICD-10-CM | POA: Diagnosis not present

## 2023-04-27 DIAGNOSIS — E875 Hyperkalemia: Secondary | ICD-10-CM | POA: Diagnosis not present

## 2023-04-27 DIAGNOSIS — Z981 Arthrodesis status: Secondary | ICD-10-CM | POA: Diagnosis not present

## 2023-04-27 DIAGNOSIS — Z7984 Long term (current) use of oral hypoglycemic drugs: Secondary | ICD-10-CM | POA: Diagnosis not present

## 2023-04-27 DIAGNOSIS — M4316 Spondylolisthesis, lumbar region: Secondary | ICD-10-CM | POA: Diagnosis not present

## 2023-04-27 DIAGNOSIS — M069 Rheumatoid arthritis, unspecified: Secondary | ICD-10-CM | POA: Diagnosis not present

## 2023-04-27 DIAGNOSIS — I509 Heart failure, unspecified: Secondary | ICD-10-CM | POA: Diagnosis not present

## 2023-04-27 DIAGNOSIS — M329 Systemic lupus erythematosus, unspecified: Secondary | ICD-10-CM | POA: Diagnosis not present

## 2023-04-27 DIAGNOSIS — I2510 Atherosclerotic heart disease of native coronary artery without angina pectoris: Secondary | ICD-10-CM | POA: Diagnosis not present

## 2023-04-27 DIAGNOSIS — M109 Gout, unspecified: Secondary | ICD-10-CM | POA: Diagnosis not present

## 2023-04-27 DIAGNOSIS — N189 Chronic kidney disease, unspecified: Secondary | ICD-10-CM | POA: Diagnosis not present

## 2023-04-27 DIAGNOSIS — Z4789 Encounter for other orthopedic aftercare: Secondary | ICD-10-CM | POA: Diagnosis not present

## 2023-04-27 DIAGNOSIS — E213 Hyperparathyroidism, unspecified: Secondary | ICD-10-CM | POA: Diagnosis not present

## 2023-04-27 DIAGNOSIS — M48061 Spinal stenosis, lumbar region without neurogenic claudication: Secondary | ICD-10-CM | POA: Diagnosis not present

## 2023-04-27 NOTE — Telephone Encounter (Signed)
Error

## 2023-05-04 DIAGNOSIS — M329 Systemic lupus erythematosus, unspecified: Secondary | ICD-10-CM | POA: Diagnosis not present

## 2023-05-04 DIAGNOSIS — R011 Cardiac murmur, unspecified: Secondary | ICD-10-CM | POA: Diagnosis not present

## 2023-05-04 DIAGNOSIS — Z7951 Long term (current) use of inhaled steroids: Secondary | ICD-10-CM | POA: Diagnosis not present

## 2023-05-04 DIAGNOSIS — M5030 Other cervical disc degeneration, unspecified cervical region: Secondary | ICD-10-CM | POA: Diagnosis not present

## 2023-05-04 DIAGNOSIS — N189 Chronic kidney disease, unspecified: Secondary | ICD-10-CM | POA: Diagnosis not present

## 2023-05-04 DIAGNOSIS — Z79899 Other long term (current) drug therapy: Secondary | ICD-10-CM | POA: Diagnosis not present

## 2023-05-04 DIAGNOSIS — I2510 Atherosclerotic heart disease of native coronary artery without angina pectoris: Secondary | ICD-10-CM | POA: Diagnosis not present

## 2023-05-04 DIAGNOSIS — Z8673 Personal history of transient ischemic attack (TIA), and cerebral infarction without residual deficits: Secondary | ICD-10-CM | POA: Diagnosis not present

## 2023-05-04 DIAGNOSIS — E785 Hyperlipidemia, unspecified: Secondary | ICD-10-CM | POA: Diagnosis not present

## 2023-05-04 DIAGNOSIS — E1122 Type 2 diabetes mellitus with diabetic chronic kidney disease: Secondary | ICD-10-CM | POA: Diagnosis not present

## 2023-05-04 DIAGNOSIS — M1990 Unspecified osteoarthritis, unspecified site: Secondary | ICD-10-CM | POA: Diagnosis not present

## 2023-05-04 DIAGNOSIS — M48061 Spinal stenosis, lumbar region without neurogenic claudication: Secondary | ICD-10-CM | POA: Diagnosis not present

## 2023-05-04 DIAGNOSIS — M4316 Spondylolisthesis, lumbar region: Secondary | ICD-10-CM | POA: Diagnosis not present

## 2023-05-04 DIAGNOSIS — I130 Hypertensive heart and chronic kidney disease with heart failure and stage 1 through stage 4 chronic kidney disease, or unspecified chronic kidney disease: Secondary | ICD-10-CM | POA: Diagnosis not present

## 2023-05-04 DIAGNOSIS — Z7984 Long term (current) use of oral hypoglycemic drugs: Secondary | ICD-10-CM | POA: Diagnosis not present

## 2023-05-04 DIAGNOSIS — Z981 Arthrodesis status: Secondary | ICD-10-CM | POA: Diagnosis not present

## 2023-05-04 DIAGNOSIS — E875 Hyperkalemia: Secondary | ICD-10-CM | POA: Diagnosis not present

## 2023-05-04 DIAGNOSIS — M069 Rheumatoid arthritis, unspecified: Secondary | ICD-10-CM | POA: Diagnosis not present

## 2023-05-04 DIAGNOSIS — E213 Hyperparathyroidism, unspecified: Secondary | ICD-10-CM | POA: Diagnosis not present

## 2023-05-04 DIAGNOSIS — I509 Heart failure, unspecified: Secondary | ICD-10-CM | POA: Diagnosis not present

## 2023-05-04 DIAGNOSIS — D631 Anemia in chronic kidney disease: Secondary | ICD-10-CM | POA: Diagnosis not present

## 2023-05-04 DIAGNOSIS — Z4789 Encounter for other orthopedic aftercare: Secondary | ICD-10-CM | POA: Diagnosis not present

## 2023-05-04 DIAGNOSIS — E1151 Type 2 diabetes mellitus with diabetic peripheral angiopathy without gangrene: Secondary | ICD-10-CM | POA: Diagnosis not present

## 2023-05-04 DIAGNOSIS — M51369 Other intervertebral disc degeneration, lumbar region without mention of lumbar back pain or lower extremity pain: Secondary | ICD-10-CM | POA: Diagnosis not present

## 2023-05-04 DIAGNOSIS — M109 Gout, unspecified: Secondary | ICD-10-CM | POA: Diagnosis not present

## 2023-05-11 DIAGNOSIS — R011 Cardiac murmur, unspecified: Secondary | ICD-10-CM | POA: Diagnosis not present

## 2023-05-11 DIAGNOSIS — Z4789 Encounter for other orthopedic aftercare: Secondary | ICD-10-CM | POA: Diagnosis not present

## 2023-05-11 DIAGNOSIS — Z7984 Long term (current) use of oral hypoglycemic drugs: Secondary | ICD-10-CM | POA: Diagnosis not present

## 2023-05-11 DIAGNOSIS — E875 Hyperkalemia: Secondary | ICD-10-CM | POA: Diagnosis not present

## 2023-05-11 DIAGNOSIS — M5030 Other cervical disc degeneration, unspecified cervical region: Secondary | ICD-10-CM | POA: Diagnosis not present

## 2023-05-11 DIAGNOSIS — E119 Type 2 diabetes mellitus without complications: Secondary | ICD-10-CM | POA: Diagnosis not present

## 2023-05-11 DIAGNOSIS — Z7951 Long term (current) use of inhaled steroids: Secondary | ICD-10-CM | POA: Diagnosis not present

## 2023-05-11 DIAGNOSIS — E785 Hyperlipidemia, unspecified: Secondary | ICD-10-CM | POA: Diagnosis not present

## 2023-05-11 DIAGNOSIS — Z79899 Other long term (current) drug therapy: Secondary | ICD-10-CM | POA: Diagnosis not present

## 2023-05-11 DIAGNOSIS — M1990 Unspecified osteoarthritis, unspecified site: Secondary | ICD-10-CM | POA: Diagnosis not present

## 2023-05-11 DIAGNOSIS — M069 Rheumatoid arthritis, unspecified: Secondary | ICD-10-CM | POA: Diagnosis not present

## 2023-05-11 DIAGNOSIS — N189 Chronic kidney disease, unspecified: Secondary | ICD-10-CM | POA: Diagnosis not present

## 2023-05-11 DIAGNOSIS — I130 Hypertensive heart and chronic kidney disease with heart failure and stage 1 through stage 4 chronic kidney disease, or unspecified chronic kidney disease: Secondary | ICD-10-CM | POA: Diagnosis not present

## 2023-05-11 DIAGNOSIS — E213 Hyperparathyroidism, unspecified: Secondary | ICD-10-CM | POA: Diagnosis not present

## 2023-05-11 DIAGNOSIS — D631 Anemia in chronic kidney disease: Secondary | ICD-10-CM | POA: Diagnosis not present

## 2023-05-11 DIAGNOSIS — M329 Systemic lupus erythematosus, unspecified: Secondary | ICD-10-CM | POA: Diagnosis not present

## 2023-05-11 DIAGNOSIS — Z8673 Personal history of transient ischemic attack (TIA), and cerebral infarction without residual deficits: Secondary | ICD-10-CM | POA: Diagnosis not present

## 2023-05-11 DIAGNOSIS — M4316 Spondylolisthesis, lumbar region: Secondary | ICD-10-CM | POA: Diagnosis not present

## 2023-05-11 DIAGNOSIS — Z981 Arthrodesis status: Secondary | ICD-10-CM | POA: Diagnosis not present

## 2023-05-11 DIAGNOSIS — M48061 Spinal stenosis, lumbar region without neurogenic claudication: Secondary | ICD-10-CM | POA: Diagnosis not present

## 2023-05-11 DIAGNOSIS — I509 Heart failure, unspecified: Secondary | ICD-10-CM | POA: Diagnosis not present

## 2023-05-11 DIAGNOSIS — M51369 Other intervertebral disc degeneration, lumbar region without mention of lumbar back pain or lower extremity pain: Secondary | ICD-10-CM | POA: Diagnosis not present

## 2023-05-11 DIAGNOSIS — M109 Gout, unspecified: Secondary | ICD-10-CM | POA: Diagnosis not present

## 2023-05-11 DIAGNOSIS — E1122 Type 2 diabetes mellitus with diabetic chronic kidney disease: Secondary | ICD-10-CM | POA: Diagnosis not present

## 2023-05-11 DIAGNOSIS — I2510 Atherosclerotic heart disease of native coronary artery without angina pectoris: Secondary | ICD-10-CM | POA: Diagnosis not present

## 2023-05-11 DIAGNOSIS — E1151 Type 2 diabetes mellitus with diabetic peripheral angiopathy without gangrene: Secondary | ICD-10-CM | POA: Diagnosis not present

## 2023-05-17 ENCOUNTER — Ambulatory Visit: Payer: Self-pay

## 2023-05-17 ENCOUNTER — Other Ambulatory Visit: Payer: Self-pay | Admitting: Neurosurgery

## 2023-05-17 DIAGNOSIS — I509 Heart failure, unspecified: Secondary | ICD-10-CM | POA: Diagnosis not present

## 2023-05-17 DIAGNOSIS — M1990 Unspecified osteoarthritis, unspecified site: Secondary | ICD-10-CM | POA: Diagnosis not present

## 2023-05-17 DIAGNOSIS — M48061 Spinal stenosis, lumbar region without neurogenic claudication: Secondary | ICD-10-CM | POA: Diagnosis not present

## 2023-05-17 DIAGNOSIS — M329 Systemic lupus erythematosus, unspecified: Secondary | ICD-10-CM | POA: Diagnosis not present

## 2023-05-17 DIAGNOSIS — Z79899 Other long term (current) drug therapy: Secondary | ICD-10-CM | POA: Diagnosis not present

## 2023-05-17 DIAGNOSIS — I130 Hypertensive heart and chronic kidney disease with heart failure and stage 1 through stage 4 chronic kidney disease, or unspecified chronic kidney disease: Secondary | ICD-10-CM | POA: Diagnosis not present

## 2023-05-17 DIAGNOSIS — E1122 Type 2 diabetes mellitus with diabetic chronic kidney disease: Secondary | ICD-10-CM | POA: Diagnosis not present

## 2023-05-17 DIAGNOSIS — E785 Hyperlipidemia, unspecified: Secondary | ICD-10-CM | POA: Diagnosis not present

## 2023-05-17 DIAGNOSIS — R011 Cardiac murmur, unspecified: Secondary | ICD-10-CM | POA: Diagnosis not present

## 2023-05-17 DIAGNOSIS — M069 Rheumatoid arthritis, unspecified: Secondary | ICD-10-CM | POA: Diagnosis not present

## 2023-05-17 DIAGNOSIS — Z4789 Encounter for other orthopedic aftercare: Secondary | ICD-10-CM | POA: Diagnosis not present

## 2023-05-17 DIAGNOSIS — Z7951 Long term (current) use of inhaled steroids: Secondary | ICD-10-CM | POA: Diagnosis not present

## 2023-05-17 DIAGNOSIS — I2510 Atherosclerotic heart disease of native coronary artery without angina pectoris: Secondary | ICD-10-CM | POA: Diagnosis not present

## 2023-05-17 DIAGNOSIS — N189 Chronic kidney disease, unspecified: Secondary | ICD-10-CM | POA: Diagnosis not present

## 2023-05-17 DIAGNOSIS — Z981 Arthrodesis status: Secondary | ICD-10-CM | POA: Diagnosis not present

## 2023-05-17 DIAGNOSIS — M51369 Other intervertebral disc degeneration, lumbar region without mention of lumbar back pain or lower extremity pain: Secondary | ICD-10-CM | POA: Diagnosis not present

## 2023-05-17 DIAGNOSIS — E875 Hyperkalemia: Secondary | ICD-10-CM | POA: Diagnosis not present

## 2023-05-17 DIAGNOSIS — E1151 Type 2 diabetes mellitus with diabetic peripheral angiopathy without gangrene: Secondary | ICD-10-CM | POA: Diagnosis not present

## 2023-05-17 DIAGNOSIS — M5030 Other cervical disc degeneration, unspecified cervical region: Secondary | ICD-10-CM | POA: Diagnosis not present

## 2023-05-17 DIAGNOSIS — E213 Hyperparathyroidism, unspecified: Secondary | ICD-10-CM | POA: Diagnosis not present

## 2023-05-17 DIAGNOSIS — M4316 Spondylolisthesis, lumbar region: Secondary | ICD-10-CM | POA: Diagnosis not present

## 2023-05-17 DIAGNOSIS — Z8673 Personal history of transient ischemic attack (TIA), and cerebral infarction without residual deficits: Secondary | ICD-10-CM | POA: Diagnosis not present

## 2023-05-17 DIAGNOSIS — D631 Anemia in chronic kidney disease: Secondary | ICD-10-CM | POA: Diagnosis not present

## 2023-05-17 DIAGNOSIS — M109 Gout, unspecified: Secondary | ICD-10-CM | POA: Diagnosis not present

## 2023-05-17 DIAGNOSIS — Z7984 Long term (current) use of oral hypoglycemic drugs: Secondary | ICD-10-CM | POA: Diagnosis not present

## 2023-05-17 MED ORDER — TRAMADOL HCL 50 MG PO TABS: 50.0000 mg | ORAL_TABLET | Freq: Four times a day (QID) | ORAL | 0 refills | Status: DC | PRN

## 2023-05-17 NOTE — Telephone Encounter (Signed)
L3-4 XLIF, removal and replacement of L3-S1 instrumentation with L3-4 fusion on 03/09/23  Tramadol 50mg  twice aday Next visit 05/31/2023

## 2023-05-17 NOTE — Patient Outreach (Signed)
  Care Coordination   Follow Up Visit Note   05/17/2023 Name: [name] MRN: [id number] DOB: 10-May-1946  [name] is a 77 y.o. year old female who sees Margaretann Loveless, MD for primary care. I spoke with  [name] by phone today.  What matters to the patients health and wellness today?  Patient reports having ongoing pain in her back and buttocks area. She reports her pain level ranges 5-7.  She states she is not able to walk long distances or stand for a long period of time. She reports having follow up visit with neurologist on  04/21/23.  Patient states the neurologist wants her to continue her home PT.  She states she is now back to 2 x week physical therapy sessions.  Patient states she is taking tramadol for pain but ran out as of yesterday. Denies having any refills.    Goals Addressed             This Visit's Progress    Patient Stated:  " I want to manage my arthritis pain and health conditions"       Interventions Today    Flowsheet Row Most Recent Value  Chronic Disease   Chronic disease during today's visit Other  [status post lumbar laminectomy, ongoing back/ buttock pain]  General Interventions   General Interventions Discussed/Reviewed General Interventions Reviewed, Doctor Visits  [evaluation of current treatment plan for mentioned health conditions and patients adherence to plan as established by provider. Assessed pain level to back/ buttock .]  Doctor Visits Discussed/Reviewed Doctor Visits Reviewed  Annabell Sabal upcoming provider visits.]  Education Interventions   Education Provided Provided Education  [Advised to take pain medications as prescribed. Advised to do PT guided/ instructed exercises as recommended.  Advised to use ice to help decrease inflammation/ pain.]  Pharmacy Interventions   Pharmacy Dicussed/Reviewed Pharmacy Topics Reviewed  [Reviewed medications. Advised to contact neurology office to request refill on tramadol.]   Safety Interventions   Safety Discussed/Reviewed Fall Risk  [assessed for falls. Assessed for ongoing home PT.]              SDOH assessments and interventions completed:  No     Care Coordination Interventions:  Yes, provided   Follow up plan: Follow up call scheduled for 06/14/23    Encounter Outcome:  Patient Visit Completed   George Ina RN,BSN,CCM [facility]  [facility], [facility] coordinator / Case Manager Phone: [phone]

## 2023-05-17 NOTE — Patient Instructions (Signed)
Visit Information  Thank you for taking time to visit with me today. Please don't hesitate to contact me if I can be of assistance to you.   Following are the goals we discussed today:   Goals Addressed             This Visit's Progress    Patient Stated:  " I want to manage my arthritis pain and health conditions"       Interventions Today    Flowsheet Row Most Recent Value  Chronic Disease   Chronic disease during today's visit Other  [status post lumbar laminectomy, ongoing back/ buttock pain]  General Interventions   General Interventions Discussed/Reviewed General Interventions Reviewed, Doctor Visits  [evaluation of current treatment plan for mentioned health conditions and patients adherence to plan as established by provider. Assessed pain level to back/ buttock .]  Doctor Visits Discussed/Reviewed Doctor Visits Reviewed  Annabell Sabal upcoming provider visits.]  Education Interventions   Education Provided Provided Education  [Advised to take pain medications as prescribed. Advised to do PT guided/ instructed exercises as recommended.  Advised to use ice to help decrease inflammation/ pain.]  Pharmacy Interventions   Pharmacy Dicussed/Reviewed Pharmacy Topics Reviewed  [Reviewed medications. Advised to contact neurology office to request refill on tramadol.]  Safety Interventions   Safety Discussed/Reviewed Fall Risk  [assessed for falls. Assessed for ongoing home PT.]              Our next appointment is by telephone on 06/14/23 at 2 pm  Please call the care guide team at [phone] if you need to cancel or reschedule your appointment.   If you are experiencing a Mental Health or Behavioral Health Crisis or need someone to talk to, please call the Suicide and Crisis Lifeline: 988 call 1-800-273-TALK (toll free, 24 hour hotline)  Patient verbalizes understanding of instructions and care plan provided today and agrees to view in MyChart. Active MyChart status and  patient understanding of how to access instructions and care plan via MyChart confirmed with patient.     George Ina RN,BSN,CCM [place]  [facility], [facility] coordinator / Case Manager Phone: [phone]

## 2023-05-25 ENCOUNTER — Telehealth: Payer: Self-pay | Admitting: Neurosurgery

## 2023-05-25 NOTE — Telephone Encounter (Signed)
Gerri Spore from [facility] has called for Verbal Orders  Continuing physical therapy  2 week 4 1 week 4  [place], [phone]

## 2023-05-25 NOTE — Telephone Encounter (Signed)
LMOM with Wes gave verbal ok to continue PT.

## 2023-05-26 DIAGNOSIS — M5030 Other cervical disc degeneration, unspecified cervical region: Secondary | ICD-10-CM | POA: Diagnosis not present

## 2023-05-26 DIAGNOSIS — I509 Heart failure, unspecified: Secondary | ICD-10-CM | POA: Diagnosis not present

## 2023-05-26 DIAGNOSIS — Z981 Arthrodesis status: Secondary | ICD-10-CM | POA: Diagnosis not present

## 2023-05-26 DIAGNOSIS — E213 Hyperparathyroidism, unspecified: Secondary | ICD-10-CM | POA: Diagnosis not present

## 2023-05-26 DIAGNOSIS — M48061 Spinal stenosis, lumbar region without neurogenic claudication: Secondary | ICD-10-CM | POA: Diagnosis not present

## 2023-05-26 DIAGNOSIS — Z7951 Long term (current) use of inhaled steroids: Secondary | ICD-10-CM | POA: Diagnosis not present

## 2023-05-26 DIAGNOSIS — Z7984 Long term (current) use of oral hypoglycemic drugs: Secondary | ICD-10-CM | POA: Diagnosis not present

## 2023-05-26 DIAGNOSIS — M4316 Spondylolisthesis, lumbar region: Secondary | ICD-10-CM | POA: Diagnosis not present

## 2023-05-26 DIAGNOSIS — M329 Systemic lupus erythematosus, unspecified: Secondary | ICD-10-CM | POA: Diagnosis not present

## 2023-05-26 DIAGNOSIS — Z8673 Personal history of transient ischemic attack (TIA), and cerebral infarction without residual deficits: Secondary | ICD-10-CM | POA: Diagnosis not present

## 2023-05-26 DIAGNOSIS — I2510 Atherosclerotic heart disease of native coronary artery without angina pectoris: Secondary | ICD-10-CM | POA: Diagnosis not present

## 2023-05-26 DIAGNOSIS — E875 Hyperkalemia: Secondary | ICD-10-CM | POA: Diagnosis not present

## 2023-05-26 DIAGNOSIS — E785 Hyperlipidemia, unspecified: Secondary | ICD-10-CM | POA: Diagnosis not present

## 2023-05-26 DIAGNOSIS — D631 Anemia in chronic kidney disease: Secondary | ICD-10-CM | POA: Diagnosis not present

## 2023-05-26 DIAGNOSIS — M51369 Other intervertebral disc degeneration, lumbar region without mention of lumbar back pain or lower extremity pain: Secondary | ICD-10-CM | POA: Diagnosis not present

## 2023-05-26 DIAGNOSIS — M069 Rheumatoid arthritis, unspecified: Secondary | ICD-10-CM | POA: Diagnosis not present

## 2023-05-26 DIAGNOSIS — R011 Cardiac murmur, unspecified: Secondary | ICD-10-CM | POA: Diagnosis not present

## 2023-05-26 DIAGNOSIS — Z79899 Other long term (current) drug therapy: Secondary | ICD-10-CM | POA: Diagnosis not present

## 2023-05-26 DIAGNOSIS — E1122 Type 2 diabetes mellitus with diabetic chronic kidney disease: Secondary | ICD-10-CM | POA: Diagnosis not present

## 2023-05-26 DIAGNOSIS — M109 Gout, unspecified: Secondary | ICD-10-CM | POA: Diagnosis not present

## 2023-05-26 DIAGNOSIS — Z4789 Encounter for other orthopedic aftercare: Secondary | ICD-10-CM | POA: Diagnosis not present

## 2023-05-26 DIAGNOSIS — I130 Hypertensive heart and chronic kidney disease with heart failure and stage 1 through stage 4 chronic kidney disease, or unspecified chronic kidney disease: Secondary | ICD-10-CM | POA: Diagnosis not present

## 2023-05-26 DIAGNOSIS — M1990 Unspecified osteoarthritis, unspecified site: Secondary | ICD-10-CM | POA: Diagnosis not present

## 2023-05-26 DIAGNOSIS — N189 Chronic kidney disease, unspecified: Secondary | ICD-10-CM | POA: Diagnosis not present

## 2023-05-26 DIAGNOSIS — E1151 Type 2 diabetes mellitus with diabetic peripheral angiopathy without gangrene: Secondary | ICD-10-CM | POA: Diagnosis not present

## 2023-05-27 ENCOUNTER — Other Ambulatory Visit: Payer: Self-pay | Admitting: Orthopedic Surgery

## 2023-05-27 DIAGNOSIS — Z981 Arthrodesis status: Secondary | ICD-10-CM | POA: Diagnosis not present

## 2023-05-27 DIAGNOSIS — M4316 Spondylolisthesis, lumbar region: Secondary | ICD-10-CM | POA: Diagnosis not present

## 2023-05-27 DIAGNOSIS — M329 Systemic lupus erythematosus, unspecified: Secondary | ICD-10-CM | POA: Diagnosis not present

## 2023-05-27 DIAGNOSIS — I2510 Atherosclerotic heart disease of native coronary artery without angina pectoris: Secondary | ICD-10-CM | POA: Diagnosis not present

## 2023-05-27 DIAGNOSIS — E1122 Type 2 diabetes mellitus with diabetic chronic kidney disease: Secondary | ICD-10-CM | POA: Diagnosis not present

## 2023-05-27 DIAGNOSIS — I130 Hypertensive heart and chronic kidney disease with heart failure and stage 1 through stage 4 chronic kidney disease, or unspecified chronic kidney disease: Secondary | ICD-10-CM | POA: Diagnosis not present

## 2023-05-27 DIAGNOSIS — M48061 Spinal stenosis, lumbar region without neurogenic claudication: Secondary | ICD-10-CM | POA: Diagnosis not present

## 2023-05-27 DIAGNOSIS — M1990 Unspecified osteoarthritis, unspecified site: Secondary | ICD-10-CM | POA: Diagnosis not present

## 2023-05-27 DIAGNOSIS — I509 Heart failure, unspecified: Secondary | ICD-10-CM | POA: Diagnosis not present

## 2023-05-27 DIAGNOSIS — Z79899 Other long term (current) drug therapy: Secondary | ICD-10-CM | POA: Diagnosis not present

## 2023-05-27 DIAGNOSIS — E213 Hyperparathyroidism, unspecified: Secondary | ICD-10-CM | POA: Diagnosis not present

## 2023-05-27 DIAGNOSIS — N189 Chronic kidney disease, unspecified: Secondary | ICD-10-CM | POA: Diagnosis not present

## 2023-05-27 DIAGNOSIS — E1151 Type 2 diabetes mellitus with diabetic peripheral angiopathy without gangrene: Secondary | ICD-10-CM | POA: Diagnosis not present

## 2023-05-27 DIAGNOSIS — Z7984 Long term (current) use of oral hypoglycemic drugs: Secondary | ICD-10-CM | POA: Diagnosis not present

## 2023-05-27 DIAGNOSIS — M069 Rheumatoid arthritis, unspecified: Secondary | ICD-10-CM | POA: Diagnosis not present

## 2023-05-27 DIAGNOSIS — M5030 Other cervical disc degeneration, unspecified cervical region: Secondary | ICD-10-CM | POA: Diagnosis not present

## 2023-05-27 DIAGNOSIS — M109 Gout, unspecified: Secondary | ICD-10-CM | POA: Diagnosis not present

## 2023-05-27 DIAGNOSIS — R011 Cardiac murmur, unspecified: Secondary | ICD-10-CM | POA: Diagnosis not present

## 2023-05-27 DIAGNOSIS — Z8673 Personal history of transient ischemic attack (TIA), and cerebral infarction without residual deficits: Secondary | ICD-10-CM | POA: Diagnosis not present

## 2023-05-27 DIAGNOSIS — Z7951 Long term (current) use of inhaled steroids: Secondary | ICD-10-CM | POA: Diagnosis not present

## 2023-05-27 DIAGNOSIS — E875 Hyperkalemia: Secondary | ICD-10-CM | POA: Diagnosis not present

## 2023-05-27 DIAGNOSIS — Z4789 Encounter for other orthopedic aftercare: Secondary | ICD-10-CM | POA: Diagnosis not present

## 2023-05-27 DIAGNOSIS — M51369 Other intervertebral disc degeneration, lumbar region without mention of lumbar back pain or lower extremity pain: Secondary | ICD-10-CM | POA: Diagnosis not present

## 2023-05-27 DIAGNOSIS — E785 Hyperlipidemia, unspecified: Secondary | ICD-10-CM | POA: Diagnosis not present

## 2023-05-27 DIAGNOSIS — D631 Anemia in chronic kidney disease: Secondary | ICD-10-CM | POA: Diagnosis not present

## 2023-05-27 NOTE — Progress Notes (Unsigned)
   REFERRING PHYSICIAN:  Margaretann Loveless Md [address] [place],  [place] [address]  DOS: 03/09/23  XLIF L3-L4 with PSF L2-L5  HISTORY OF PRESENT ILLNESS:  She was still having back and buttock pain at her last visit. PT was ordered at her last visit.   She continues with constant LBP/left >right buttock pain that is not any better since her surgery. Pain is worse with prolonged standing, walking, and sitting. No leg pain. No numbness, tingling, or weakness.    PHYSICAL EXAMINATION:  General: Patient is well developed, well nourished, calm, collected, and in no apparent distress.   NEUROLOGICAL:  General: In no acute distress.   Awake, alert, oriented to person, place, and time.  Pupils equal round and reactive to light.  Facial tone is symmetric.     Strength:          Side Iliopsoas Quads Hamstring PF DF EHL  R 5 5 5 5 5 5   L 5 5 5 5 5 5    Incisions well healed.    ROS (Neurologic):  Negative except as noted above  IMAGING: Lumbar xrays dated 05/31/23:  No complications noted.  Above radiology report not yet available.   ASSESSMENT/PLAN:  ROSSY GASPARI is doing fair s/p above surgery. She continues with constant LBP/left >right buttock pain that is worse with prolonged standing, walking, and sitting. No leg pain.    Treatment options reviewed with patient and following plan made:   - MRI and CT of lumbar spine to further evaluate her persistent pain.  - Can start to return to activity as tolerated.  - Will review imaging results with Dr. Myer Haff and determine follow up at that time.  - She will need 6 month postop scheduled with him at some point as well.   Advised to contact the office if any questions or concerns arise.  Drake Leach PA-C Department of neurosurgery

## 2023-05-30 ENCOUNTER — Ambulatory Visit (INDEPENDENT_AMBULATORY_CARE_PROVIDER_SITE_OTHER): Payer: 59 | Admitting: Internal Medicine

## 2023-05-30 ENCOUNTER — Encounter: Payer: Self-pay | Admitting: Internal Medicine

## 2023-05-30 VITALS — BP 150/90 | HR 78 | Ht 63.0 in | Wt 177.2 lb

## 2023-05-30 DIAGNOSIS — E1159 Type 2 diabetes mellitus with other circulatory complications: Secondary | ICD-10-CM | POA: Diagnosis not present

## 2023-05-30 DIAGNOSIS — I152 Hypertension secondary to endocrine disorders: Secondary | ICD-10-CM | POA: Diagnosis not present

## 2023-05-30 DIAGNOSIS — E1165 Type 2 diabetes mellitus with hyperglycemia: Secondary | ICD-10-CM

## 2023-05-30 DIAGNOSIS — Z1231 Encounter for screening mammogram for malignant neoplasm of breast: Secondary | ICD-10-CM

## 2023-05-30 DIAGNOSIS — D508 Other iron deficiency anemias: Secondary | ICD-10-CM

## 2023-05-30 DIAGNOSIS — M25561 Pain in right knee: Secondary | ICD-10-CM

## 2023-05-30 DIAGNOSIS — M0579 Rheumatoid arthritis with rheumatoid factor of multiple sites without organ or systems involvement: Secondary | ICD-10-CM | POA: Diagnosis not present

## 2023-05-30 DIAGNOSIS — E782 Mixed hyperlipidemia: Secondary | ICD-10-CM | POA: Diagnosis not present

## 2023-05-30 DIAGNOSIS — M542 Cervicalgia: Secondary | ICD-10-CM | POA: Diagnosis not present

## 2023-05-30 DIAGNOSIS — Z0000 Encounter for general adult medical examination without abnormal findings: Secondary | ICD-10-CM

## 2023-05-30 DIAGNOSIS — M3214 Glomerular disease in systemic lupus erythematosus: Secondary | ICD-10-CM

## 2023-05-30 LAB — GLUCOSE, POCT (MANUAL RESULT ENTRY): POC Glucose: 152 mg/dL — AB (ref 70–99)

## 2023-05-31 ENCOUNTER — Ambulatory Visit (INDEPENDENT_AMBULATORY_CARE_PROVIDER_SITE_OTHER): Payer: 59 | Admitting: Orthopedic Surgery

## 2023-05-31 ENCOUNTER — Ambulatory Visit
Admission: RE | Admit: 2023-05-31 | Discharge: 2023-05-31 | Disposition: A | Discharge status: Home / Self Care | Payer: 59 | Attending: Orthopedic Surgery | Admitting: Orthopedic Surgery

## 2023-05-31 ENCOUNTER — Ambulatory Visit
Admission: RE | Admit: 2023-05-31 | Discharge: 2023-05-31 | Disposition: A | Discharge status: Home / Self Care | Payer: 59 | Source: Ambulatory Visit | Attending: Orthopedic Surgery | Admitting: Orthopedic Surgery

## 2023-05-31 ENCOUNTER — Encounter: Payer: Self-pay | Admitting: Internal Medicine

## 2023-05-31 ENCOUNTER — Encounter: Payer: Self-pay | Admitting: Orthopedic Surgery

## 2023-05-31 VITALS — BP 128/82 | Ht 63.0 in | Wt 177.0 lb

## 2023-05-31 DIAGNOSIS — Z981 Arthrodesis status: Secondary | ICD-10-CM

## 2023-05-31 DIAGNOSIS — M51369 Other intervertebral disc degeneration, lumbar region without mention of lumbar back pain or lower extremity pain: Secondary | ICD-10-CM

## 2023-05-31 DIAGNOSIS — M4316 Spondylolisthesis, lumbar region: Secondary | ICD-10-CM

## 2023-05-31 DIAGNOSIS — E1159 Type 2 diabetes mellitus with other circulatory complications: Secondary | ICD-10-CM | POA: Insufficient documentation

## 2023-05-31 DIAGNOSIS — D649 Anemia, unspecified: Secondary | ICD-10-CM | POA: Insufficient documentation

## 2023-05-31 DIAGNOSIS — M438X6 Other specified deforming dorsopathies, lumbar region: Secondary | ICD-10-CM | POA: Diagnosis not present

## 2023-05-31 DIAGNOSIS — M25561 Pain in right knee: Secondary | ICD-10-CM | POA: Insufficient documentation

## 2023-05-31 DIAGNOSIS — Z4789 Encounter for other orthopedic aftercare: Secondary | ICD-10-CM | POA: Diagnosis not present

## 2023-05-31 NOTE — Patient Instructions (Signed)
It was nice to see you today.   I am sorry you are not feeling better yet.   I want to get an MRI and a CT scan of your lower back. We will get this approved through your insurance and [facility] will call you to schedule the appointment.   [facility] (building with the white pillars) is located off of [address]. The address is [address], [place], [place] [address].    After you have the MRI, it takes 14-21 days for me to get the results back. Once I have them, I will review with Dr. Myer Haff and we will schedule a follow up.   Please do not hesitate to call if you have any questions or concerns. You can also message me in MyChart.   Drake Leach PA-C [phone]     The physicians and staff at [facility] Neurosurgery at [facility] are committed to providing excellent care. You may receive a survey asking for feedback about your experience at our office. We value you your feedback and appreciate you taking the time to to fill it out. The [facility] leadership team is also available to discuss your experience in person, feel free to contact us [phone].

## 2023-05-31 NOTE — Progress Notes (Signed)
Established Patient Office Visit  Subjective:  Patient ID: [name], female    DOB: 1945/12/10  Age: 77 y.o. MRN: [id number]  Chief Complaint  Patient presents with   Annual Exam    AWV    Patient comes in for her follow-up as well as annual wellness visit.  She has not been in the office for a while since she was recovering from her lumbar spinal surgery.  Patient reports that her back pain is not much better after the surgery and is being considered for other options.  Today she is also complaining of right knee pain and swelling.  Another thing she noticed that there is some swelling and tenderness on the left side of her neck.  She does not have any swallowing or speech difficulties. Mammogram needs to be scheduled.  Patient is up-to-date on colonoscopy and diabetic eye exams.  PHQ-9/GAD score 6/0. CFS score is 0. She is under care of nephrology for her kidney disease.    No other concerns at this time.   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 b.)07/18/2006 -endovas oblit complex ACOM aneur. c.) Endovas Tx of 6.8x74mm RMCA aneur. d.) 3.5x27mm remnant RMCA aneur 2/2 coil compaction. e.) Interval 3.2x3.11mm saccular outpouching in ACOM c/w mild recannulization in neck. 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 g.) 3.7x29mm remnant of prev Tx'd ACOM aneur -failed embol 02/24/2012.   CKD (chronic kidney disease), stage IV (HCC)    a.) solitary functioning kidney on the RIGHT   Complication of anesthesia    a.) delayed emergence   Coronary artery disease 02/23/2007   a.) LHC 02/23/2007 --> EF 50%; 30% pLAD, 70% mRCA --> planned for staged PCI. b.) PCI 02/27/2007: EF 60%; 3.5 x 15 mm Vision BMS to 80% mRCA. b.) LHC 07/24/2007: EF 60%; minor irregs; no occlusive CAD; no intervention. c.) LHC 10/28/2010: EF 60%; 30% mLAD, LCx with minor  luminal irregs, 20% ISR mRCA; no intervention. d.) LHC 05/31/2013: EF 60%; 40% mLAD, 30% ISR m-dRCA; no interventions.   DDD (degenerative disc disease), cervical    a.) s/p ACDF C5-C7; hardware in neck; patient appreciates stiffness and issues with mobility   Diastolic dysfunction    a.)  TTE 07/07/2021: EF 87.8%; normal PASP; trace TR/MR; G1DD.   Elevated LEFT hemidiaphragm    Gout    Headache(784.0)    Heart murmur    HLD (hyperlipidemia)    HOH (hard of hearing)    Has hearing aids, doesn't wear   Hyperkalemia    Hyperparathyroidism due to renal insufficiency (HCC)    Hypertension    Incomplete right bundle branch block (RBBB)    Insomnia    a.) takes zolpidem   Long term (current) use of immunomodulator    a.) on DMARD therapy (hydroxychloroquine) for RA/SLE Dx.   Lumbar adjacent segment disease with spondylolisthesis    Multiple acquired cysts of kidney    On chronic clopidogrel therapy    OSA (obstructive sleep apnea)    a.) does NOT use nocturnal pap therapy   Osteoarthritis    Pancreatic cyst    Peripheral vascular disease (HCC)    Pneumonia    Post-COVID chronic cough    Pulmonary nodule    RBBB (right bundle branch block)    Rectal bleeding    Rheumatoid arthritis (HCC)  a.) on DMARD; hydroxychloriquine   Shortness of breath 03/01/2023   Systemic lupus erythematosus (HCC)    T2DM (type 2 diabetes mellitus) (HCC)    Tendinitis of left wrist    Thickened endometrium    Wears dentures    partial lower    Past Surgical History:  Procedure Laterality Date   ANEURYSM COILING  08/2013   ANTERIOR LATERAL LUMBAR FUSION WITH PERCUTANEOUS SCREW 1 LEVEL N/A 03/09/2023   Procedure: L3-4 LATERAL LUMBAR INTERBODY FUSION;  Surgeon: Venetia Night, MD;  Location: [facility] ORS;  Service: Neurosurgery;  Laterality: N/A;   APPLICATION OF INTRAOPERATIVE CT SCAN N/A 03/09/2023   Procedure: APPLICATION OF INTRAOPERATIVE CT SCAN;  Surgeon: Venetia Night, MD;  Location: [facility]  ORS;  Service: Neurosurgery;  Laterality: N/A;   Attempted embolization of previously treated ACOM aneurysm; unsuccessful N/A 01/28/2012   Location: [place]   BACK SURGERY     BARTHOLIN CYST MARSUPIALIZATION     BICEPT TENODESIS Right 03/02/2022   Procedure: BICEPS TENODESIS;  Surgeon: Christena Flake, MD;  Location: [facility] ORS;  Service: Orthopedics;  Laterality: Right;   BREAST CYST ASPIRATION Right 01/25/2012   FNA neg.   BREAST EXCISIONAL BIOPSY Left 06/26/2007   neg   BUNIONECTOMY Bilateral 2022   CARDIAC CATHETERIZATION Left 02/23/2007   Procedure: CARDIAC CATHETERIZATION; Location: [facility]; Surgeon: Adrian Blackwater, MD   CARDIAC CATHETERIZATION Left 07/31/2007   Procedure: CARDIAC CATHETERIZATION; Location: [facility]; Surgeon: Adrian Blackwater, MD   CARDIAC CATHETERIZATION Left 10/28/2010   Procedure: CARDIAC CATHETERIZATION; Location: [facility]; Surgeon: Adrian Blackwater, MD   CARDIAC CATHETERIZATION Left 05/31/2013   Procedure: CARDIAC CATHETERIZATION; Location: [facility]; Surgeon: Adrian Blackwater, MD   Carotid arteriogram; 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 N/A 07/18/2006   Location: [facility]   CATARACT EXTRACTION     CERVICAL FUSION N/A    Procedure: ACDF C5-C7   COLONOSCOPY WITH PROPOFOL N/A 10/04/2019   Procedure: COLONOSCOPY WITH BIOPSY;  Surgeon: Toney Reil, MD;  Location: [facility];  Service: Endoscopy;  Laterality: N/A;  Diabetic (borderline) - oral meds priority 3   CORONARY STENT INTERVENTION Left 02/27/2007   Procedure: CORONARY STENT INTERVENTION (3.5 x 15 mm Vision BMS to mRCA); Location: [facility]; Surgeon: Lorine Bears, MD   Endovascular near complete obliteration of enlarging neck remnant of previously treated RIGHT MCA aneurysm using stent assisted coiling N/A 09/03/2010   Location: [facility]   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 N/A 10/03/2006   Location: [facility]   HYSTEROSCOPY WITH D & C N/A 08/04/2021   Procedure: DILATATION AND CURETTAGE /HYSTEROSCOPY;  Surgeon: Natale Milch, MD;  Location: [facility] ORS;  Service: Gynecology;  Laterality: N/A;   POLYPECTOMY N/A 10/04/2019   Procedure: POLYPECTOMY;  Surgeon: Toney Reil, MD;  Location: [facility];  Service: Endoscopy;  Laterality: N/A;   REVERSE SHOULDER ARTHROPLASTY Right 03/02/2022   Procedure: REVERSE SHOULDER ARTHROPLASTY;  Surgeon: Christena Flake, MD;  Location: [facility] OR[facility];  Service: Orthopedics;  Laterality: Right;   UPPER ESOPHAGEAL ENDOSCOPIC ULTRASOUND (EUS) N/A 04/15/2016   Procedure: UPPER ESOPHAGEAL ENDOSCOPIC ULTRASOUND (EUS);  Surgeon: Bearl Mulberry, MD;  Location: [facility];  Service: Gastroenterology;  Laterality: N/A;    Social History   Socioeconomic History   Marital status: Widowed    Spouse name: Not on file   Number of children: Not on file   Years of education: Not on file   Highest education level: Not on file  Occupational History   Not on file  Tobacco Use   Smoking status: Never   Smokeless tobacco: Never  Vaping Use   Vaping status: Never Used  Substance and Sexual Activity   Alcohol use: Yes    Comment: Occasionally at christmas   Drug use: No   Sexual activity: Not Currently    Birth control/protection: Post-menopausal  Other Topics Concern   Not on file  Social History Narrative   Lives with daughter   Social Determinants of Health   Financial Resource Strain: Low Risk  (02/12/2022)   Overall Financial Resource Strain (CARDIA)    Difficulty of Paying Living Expenses: Not very hard  Food Insecurity: No Food Insecurity (03/09/2023)   Hunger Vital Sign    Worried About Running Out of Food in the Last Year: Never true    Ran Out of Food in the Last Year: Never true  Transportation Needs: No Transportation Needs (03/09/2023)   PRAPARE - Administrator, Civil Service (Medical): No    Lack of Transportation  (Non-Medical): No  Physical Activity: Not on file  Stress: No Stress Concern Present (02/26/2022)   [employer] of Occupational Health - Occupational Stress Questionnaire    Feeling of Stress : Not at all  Social Connections: Moderately Isolated (02/26/2022)   Social Connection and Isolation Panel [NHANES]    Frequency of Communication with Friends and Family: More than three times a week    Frequency of Social Gatherings with Friends and Family: More than three times a week    Attends Religious Services: Never    Database administrator or Organizations: Yes    Attends Banker Meetings: Never    Marital Status: Widowed  Intimate Partner Violence: Not At Risk (03/09/2023)   Humiliation, Afraid, Rape, and Kick questionnaire    Fear of Current or Ex-Partner: No    Emotionally Abused: No    Physically Abused: No    Sexually Abused: No    Family History  Problem Relation Age of Onset   Breast cancer Paternal Aunt 76   Breast cancer Paternal Aunt    Heart disease Mother    Heart disease Father    Stroke Father    Diabetes Sister    Diabetes Brother     Allergies  Allergen Reactions   Isosorbide Dinitrate Other (See Comments)    Collapse   Amlodipine Swelling   Sulfa Antibiotics Itching   Tizanidine Hcl    Oxycodone Itching   Sulfasalazine Itching    Outpatient Medications Prior to Visit  Medication Sig   allopurinol (ZYLOPRIM) 100 MG tablet TAKE 1 TABLET BY MOUTH EVERY DAY   clotrimazole-betamethasone (LOTRISONE) cream APPLY TWICE A DAY TO GROIN AREA SKIN FOR 1 WEEK AND THEN AS NEEDED.   fluticasone (FLONASE) 50 MCG/ACT nasal spray SPRAY 2 SPRAYS INTO EACH NOSTRIL EVERY DAY (Patient taking differently: Place 1 spray into both nostrils daily as needed.)   furosemide (LASIX) 40 MG tablet TAKE 1 TABLET BY MOUTH EVERY DAY   gabapentin (NEURONTIN) 300 MG capsule Take 1 capsule (300 mg total) by mouth at bedtime.   glimepiride (AMARYL) 2 MG tablet Take 1 tablet (2  mg total) by mouth See admin instructions. Take 1 mg daily, may increase to 2 mg if blood sugar is over 175 (Patient taking differently: Take 2 mg by mouth daily with breakfast.)   hydrALAZINE (APRESOLINE) 100 MG tablet TAKE 1 TABLET BY MOUTH TWICE A DAY (Patient taking differently: Take 100  mg by mouth 2 (two) times daily. Takes/1/2 tablet BID)   hydroxychloroquine (PLAQUENIL) 200 MG tablet Take 200 mg by mouth daily.   labetalol (NORMODYNE) 300 MG tablet TAKE 1 TABLET BY MOUTH TWICE A DAY   losartan (COZAAR) 100 MG tablet Take 1 tablet (100 mg total) by mouth every morning.   methocarbamol (ROBAXIN) 500 MG tablet TAKE 1 TABLET BY MOUTH EVERY 6 HOURS AS NEEDED FOR MUSCLE SPASMS.   pantoprazole (PROTONIX) 40 MG tablet TAKE 1 TABLET BY MOUTH EVERY DAY   rosuvastatin (CRESTOR) 40 MG tablet TAKE 1 TABLET BY MOUTH EVERY DAY   senna (SENOKOT) 8.6 MG TABS tablet Take 1 tablet (8.6 mg total) by mouth daily as needed for mild constipation.   SYMBICORT 80-4.5 MCG/ACT inhaler INHALE 1 PUFF BY MOUTH TWICE A DAY   traMADol (ULTRAM) 50 MG tablet Take 1 tablet (50 mg total) by mouth every 6 (six) hours as needed for moderate pain (pain score 4-6).   tretinoin (RETIN-A) 0.025 % cream TWICE A DAY   zolpidem (AMBIEN) 5 MG tablet Take 1 tablet (5 mg total) by mouth at bedtime as needed for sleep.   sodium zirconium cyclosilicate (LOKELMA) 10 g PACK packet Take 10 g by mouth as needed. (Patient not taking: Reported on 05/30/2023)   No facility-administered medications prior to visit.    Review of Systems  Constitutional:  Positive for malaise/fatigue. Negative for chills, fever and weight loss.  HENT: Negative.  Negative for congestion, ear pain, hearing loss and sinus pain.   Eyes: Negative.  Negative for blurred vision, double vision and photophobia.  Respiratory: Negative.  Negative for cough, shortness of breath and stridor.   Cardiovascular: Negative.  Negative for chest pain, palpitations and leg swelling.   Gastrointestinal: Negative.  Negative for abdominal pain, constipation, diarrhea, heartburn, nausea and vomiting.  Genitourinary: Negative.  Negative for dysuria and flank pain.  Musculoskeletal:  Positive for joint pain. Negative for myalgias.  Skin: Negative.  Negative for itching and rash.  Neurological: Negative.  Negative for dizziness and headaches.  Endo/Heme/Allergies: Negative.   Psychiatric/Behavioral: Negative.  Negative for depression and suicidal ideas. The patient is not nervous/anxious.        Objective:   BP (!) 150/90   Pulse 78   Ht 5\' 3"  (1.6 m)   Wt 177 lb 3.2 oz (80.4 kg)   SpO2 95%   BMI 31.39 kg/m   Vitals:   05/30/23 1308  BP: (!) 150/90  Pulse: 78  Height: 5\' 3"  (1.6 m)  Weight: 177 lb 3.2 oz (80.4 kg)  SpO2: 95%  BMI (Calculated): 31.4    Physical Exam Vitals and nursing note reviewed.  Constitutional:      Appearance: Normal appearance.  HENT:     Head: Normocephalic and atraumatic.     Nose: Nose normal.     Mouth/Throat:     Mouth: Mucous membranes are moist.     Pharynx: Oropharynx is clear.  Eyes:     Conjunctiva/sclera: Conjunctivae normal.     Pupils: Pupils are equal, round, and reactive to light.  Neck:     Thyroid: No thyroid mass.  Cardiovascular:     Rate and Rhythm: Normal rate and regular rhythm.     Pulses: Normal pulses.     Heart sounds: Normal heart sounds. No murmur heard. Pulmonary:     Effort: Pulmonary effort is normal.     Breath sounds: Normal breath sounds. No wheezing.  Abdominal:     General:  Bowel sounds are normal.     Palpations: Abdomen is soft.     Tenderness: There is no abdominal tenderness. There is no right CVA tenderness or left CVA tenderness.  Musculoskeletal:        General: Swelling (Right knee) present.     Cervical back: Neck supple. No edema, erythema, signs of trauma, rigidity or crepitus. No pain with movement. Normal range of motion.     Right lower leg: No edema.     Left lower  leg: No edema.  Lymphadenopathy:     Cervical: No cervical adenopathy.     Right cervical: No superficial, deep or posterior cervical adenopathy.    Left cervical: No superficial, deep or posterior cervical adenopathy.  Skin:    General: Skin is warm and dry.  Neurological:     General: No focal deficit present.     Mental Status: She is alert and oriented to person, place, and time.  Psychiatric:        Mood and Affect: Mood normal.        Behavior: Behavior normal.      Results for orders placed or performed in visit on 05/30/23  POCT Glucose (CBG)  Result Value Ref Range   POC Glucose 152 (A) 70 - 99 mg/dl    Recent Results (from the past 2160 hour(s))  Glucose, capillary     Status: Abnormal   Collection Time: 03/09/23  6:30 AM  Result Value Ref Range   Glucose-Capillary 100 (H) 70 - 99 mg/dL    Comment: Glucose reference range applies only to samples taken after fasting for at least 8 hours.  I-STAT, chem 8     Status: Abnormal   Collection Time: 03/09/23  6:53 AM  Result Value Ref Range   Sodium 147 (H) 135 - 145 mmol/L   Potassium 3.7 3.5 - 5.1 mmol/L   Chloride 114 (H) 98 - 111 mmol/L   BUN 46 (H) 8 - 23 mg/dL   Creatinine, Ser 4.09 (H) 0.44 - 1.00 mg/dL   Glucose, Bld 811 (H) 70 - 99 mg/dL    Comment: Glucose reference range applies only to samples taken after fasting for at least 8 hours.   Calcium, Ion 1.24 1.15 - 1.40 mmol/L   TCO2 20 (L) 22 - 32 mmol/L   Hemoglobin 10.5 (L) 12.0 - 15.0 g/dL   HCT 91.4 (L) 78.2 - 95.6 %  Glucose, capillary     Status: Abnormal   Collection Time: 03/09/23 11:22 AM  Result Value Ref Range   Glucose-Capillary 234 (H) 70 - 99 mg/dL    Comment: Glucose reference range applies only to samples taken after fasting for at least 8 hours.  Glucose, capillary     Status: Abnormal   Collection Time: 03/09/23  7:26 PM  Result Value Ref Range   Glucose-Capillary 233 (H) 70 - 99 mg/dL    Comment: Glucose reference range applies only to  samples taken after fasting for at least 8 hours.  Glucose, capillary     Status: Abnormal   Collection Time: 03/09/23  9:54 PM  Result Value Ref Range   Glucose-Capillary 266 (H) 70 - 99 mg/dL    Comment: Glucose reference range applies only to samples taken after fasting for at least 8 hours.   Comment 1 Notify RN    Comment 2 Document in Chart   CBC     Status: Abnormal   Collection Time: 03/10/23  4:51 AM  Result Value Ref Range  WBC 8.7 4.0 - 10.5 K/uL   RBC 2.76 (L) 3.87 - 5.11 MIL/uL   Hemoglobin 8.4 (L) 12.0 - 15.0 g/dL   HCT 01.0 (L) 27.2 - 53.6 %   MCV 90.2 80.0 - 100.0 fL   MCH 30.4 26.0 - 34.0 pg   MCHC 33.7 30.0 - 36.0 g/dL   RDW 64.4 03.4 - 74.2 %   Platelets 136 (L) 150 - 400 K/uL   nRBC 0.0 0.0 - 0.2 %    Comment: Performed at [facility], [address], [place], [place] [address]  Basic metabolic panel     Status: Abnormal   Collection Time: 03/10/23  4:51 AM  Result Value Ref Range   Sodium 141 135 - 145 mmol/L   Potassium 4.8 3.5 - 5.1 mmol/L   Chloride 111 98 - 111 mmol/L   CO2 20 (L) 22 - 32 mmol/L   Glucose, Bld 174 (H) 70 - 99 mg/dL    Comment: Glucose reference range applies only to samples taken after fasting for at least 8 hours.   BUN 54 (H) 8 - 23 mg/dL   Creatinine, Ser 8.75 (H) 0.44 - 1.00 mg/dL   Calcium 8.2 (L) 8.9 - 10.3 mg/dL   GFR, Estimated 19 (L) >60 mL/min    Comment: (NOTE) Calculated using the CKD-EPI Creatinine Equation (2021)    Anion gap 10 5 - 15    Comment: Performed at [facility], [address] [place], [place] [address]  Glucose, capillary     Status: Abnormal   Collection Time: 03/10/23  7:51 AM  Result Value Ref Range   Glucose-Capillary 141 (H) 70 - 99 mg/dL    Comment: Glucose reference range applies only to samples taken after fasting for at least 8 hours.   Comment 1 Notify RN    Comment 2 Document in Chart   Glucose, capillary     Status: Abnormal   Collection Time: 03/10/23 12:22 PM   Result Value Ref Range   Glucose-Capillary 157 (H) 70 - 99 mg/dL    Comment: Glucose reference range applies only to samples taken after fasting for at least 8 hours.  Glucose, capillary     Status: Abnormal   Collection Time: 03/10/23  4:36 PM  Result Value Ref Range   Glucose-Capillary 183 (H) 70 - 99 mg/dL    Comment: Glucose reference range applies only to samples taken after fasting for at least 8 hours.   Comment 1 Notify RN    Comment 2 Document in Chart   Glucose, capillary     Status: Abnormal   Collection Time: 03/10/23 10:11 PM  Result Value Ref Range   Glucose-Capillary 175 (H) 70 - 99 mg/dL    Comment: Glucose reference range applies only to samples taken after fasting for at least 8 hours.   Comment 1 Notify RN    Comment 2 Document in Chart   CBC     Status: Abnormal   Collection Time: 03/11/23  6:31 AM  Result Value Ref Range   WBC 7.3 4.0 - 10.5 K/uL   RBC 2.51 (L) 3.87 - 5.11 MIL/uL   Hemoglobin 7.6 (L) 12.0 - 15.0 g/dL   HCT 95.1 (L) 88.4 - 16.6 %   MCV 92.0 80.0 - 100.0 fL   MCH 30.3 26.0 - 34.0 pg   MCHC 32.9 30.0 - 36.0 g/dL   RDW 06.3 01.6 - 01.0 %   Platelets 115 (L) 150 - 400 K/uL   nRBC  0.0 0.0 - 0.2 %    Comment: Performed at [facility], [address] [place], [place] [address]  Basic metabolic panel     Status: Abnormal   Collection Time: 03/11/23  6:31 AM  Result Value Ref Range   Sodium 142 135 - 145 mmol/L   Potassium 4.1 3.5 - 5.1 mmol/L   Chloride 111 98 - 111 mmol/L   CO2 23 22 - 32 mmol/L   Glucose, Bld 122 (H) 70 - 99 mg/dL    Comment: Glucose reference range applies only to samples taken after fasting for at least 8 hours.   BUN 64 (H) 8 - 23 mg/dL   Creatinine, Ser 7.56 (H) 0.44 - 1.00 mg/dL   Calcium 8.1 (L) 8.9 - 10.3 mg/dL   GFR, Estimated 16 (L) >60 mL/min    Comment: (NOTE) Calculated using the CKD-EPI Creatinine Equation (2021)    Anion gap 8 5 - 15    Comment: Performed at [facility], [address] [place], [place] [address]  Glucose, capillary     Status: Abnormal   Collection Time: 03/11/23  8:36 AM  Result Value Ref Range   Glucose-Capillary 106 (H) 70 - 99 mg/dL    Comment: Glucose reference range applies only to samples taken after fasting for at least 8 hours.  Glucose, capillary     Status: Abnormal   Collection Time: 03/11/23 11:30 AM  Result Value Ref Range   Glucose-Capillary 205 (H) 70 - 99 mg/dL    Comment: Glucose reference range applies only to samples taken after fasting for at least 8 hours.  CBC with Differential     Status: Abnormal   Collection Time: 03/13/23  8:01 AM  Result Value Ref Range   WBC 9.3 4.0 - 10.5 K/uL   RBC 2.81 (L) 3.87 - 5.11 MIL/uL   Hemoglobin 8.4 (L) 12.0 - 15.0 g/dL   HCT 51.8 (L) 84.1 - 66.0 %   MCV 94.0 80.0 - 100.0 fL   MCH 29.9 26.0 - 34.0 pg   MCHC 31.8 30.0 - 36.0 g/dL   RDW 63.0 16.0 - 10.9 %   Platelets 143 (L) 150 - 400 K/uL   nRBC 0.0 0.0 - 0.2 %   Neutrophils Relative % 78 %   Neutro Abs 7.1 1.7 - 7.7 K/uL   Lymphocytes Relative 15 %   Lymphs Abs 1.4 0.7 - 4.0 K/uL   Monocytes Relative 6 %   Monocytes Absolute 0.6 0.1 - 1.0 K/uL   Eosinophils Relative 1 %   Eosinophils Absolute 0.1 0.0 - 0.5 K/uL   Basophils Relative 0 %   Basophils Absolute 0.0 0.0 - 0.1 K/uL   Immature Granulocytes 0 %   Abs Immature Granulocytes 0.04 0.00 - 0.07 K/uL    Comment: Performed at [facility], [address] [place], [place] [address]  Comprehensive metabolic panel     Status: Abnormal   Collection Time: 03/13/23  8:01 AM  Result Value Ref Range   Sodium 139 135 - 145 mmol/L   Potassium 4.6 3.5 - 5.1 mmol/L   Chloride 109 98 - 111 mmol/L   CO2 21 (L) 22 - 32 mmol/L   Glucose, Bld 137 (H) 70 - 99 mg/dL    Comment: Glucose reference range applies only to samples taken after fasting for at least 8 hours.   BUN 54 (H) 8 - 23 mg/dL   Creatinine, Ser 7.32 (H) 0.44 - 1.00 mg/dL   Calcium  8.7 (L) 8.9 - 10.3 mg/dL   Total  Protein 6.1 (L) 6.5 - 8.1 g/dL   Albumin 3.2 (L) 3.5 - 5.0 g/dL   AST 29 15 - 41 U/L   ALT 18 0 - 44 U/L   Alkaline Phosphatase 56 38 - 126 U/L   Total Bilirubin 1.1 0.3 - 1.2 mg/dL   GFR, Estimated 21 (L) >60 mL/min    Comment: (NOTE) Calculated using the CKD-EPI Creatinine Equation (2021)    Anion gap 9 5 - 15    Comment: Performed at [facility], [address] [place], [place] [address]  Lipase, blood     Status: None   Collection Time: 03/13/23  8:01 AM  Result Value Ref Range   Lipase 35 11 - 51 U/L    Comment: Performed at [facility], [address] [place], [place] [address]  POCT Glucose (CBG)     Status: Abnormal   Collection Time: 05/30/23  1:15 PM  Result Value Ref Range   POC Glucose 152 (A) 70 - 99 mg/dl      Assessment & Plan:  Schedule CT scan of the neck without contrast, for pain, swelling and tenderness on the left side of neck .orthopedic consult for right knee pain. Continue other medications. Problem List Items Addressed This Visit     Systemic lupus erythematosus (HCC)   Mixed hyperlipidemia   Relevant Orders   Lipid Panel w/o Chol/HDL Ratio   Rheumatoid arthritis involving multiple sites with positive rheumatoid factor (HCC)   Breast cancer screening by mammogram   Relevant Orders   MM 3D SCREENING MAMMOGRAM BILATERAL BREAST   Type 2 diabetes mellitus with hyperglycemia, without long-term current use of insulin (HCC)   Relevant Orders   POCT Glucose (CBG) (Completed)   Hemoglobin A1c   Hypertension associated with diabetes (HCC)   Relevant Orders   CMP14+EGFR   Acute pain of right knee   Relevant Orders   Ambulatory referral to Orthopedic Surgery   Absolute anemia   Relevant Orders   CBC with Diff   Other Visit Diagnoses     Medicare annual wellness visit, subsequent    -  Primary   Anterior neck pain       Relevant Orders   CT SOFT TISSUE NECK W WO CONTRAST       Return in about 2 weeks (around 06/13/2023).    Total time spent: 30 minutes  Margaretann Loveless, MD  05/30/2023   This document may have been prepared by [facility] Voice Recognition software and as such may include unintentional dictation errors.

## 2023-06-01 DIAGNOSIS — M109 Gout, unspecified: Secondary | ICD-10-CM | POA: Diagnosis not present

## 2023-06-01 DIAGNOSIS — I130 Hypertensive heart and chronic kidney disease with heart failure and stage 1 through stage 4 chronic kidney disease, or unspecified chronic kidney disease: Secondary | ICD-10-CM | POA: Diagnosis not present

## 2023-06-01 DIAGNOSIS — Z4789 Encounter for other orthopedic aftercare: Secondary | ICD-10-CM | POA: Diagnosis not present

## 2023-06-01 DIAGNOSIS — R011 Cardiac murmur, unspecified: Secondary | ICD-10-CM | POA: Diagnosis not present

## 2023-06-01 DIAGNOSIS — I2510 Atherosclerotic heart disease of native coronary artery without angina pectoris: Secondary | ICD-10-CM | POA: Diagnosis not present

## 2023-06-01 DIAGNOSIS — N189 Chronic kidney disease, unspecified: Secondary | ICD-10-CM | POA: Diagnosis not present

## 2023-06-01 DIAGNOSIS — I509 Heart failure, unspecified: Secondary | ICD-10-CM | POA: Diagnosis not present

## 2023-06-01 DIAGNOSIS — Z79899 Other long term (current) drug therapy: Secondary | ICD-10-CM | POA: Diagnosis not present

## 2023-06-01 DIAGNOSIS — E785 Hyperlipidemia, unspecified: Secondary | ICD-10-CM | POA: Diagnosis not present

## 2023-06-01 DIAGNOSIS — Z7984 Long term (current) use of oral hypoglycemic drugs: Secondary | ICD-10-CM | POA: Diagnosis not present

## 2023-06-01 DIAGNOSIS — E213 Hyperparathyroidism, unspecified: Secondary | ICD-10-CM | POA: Diagnosis not present

## 2023-06-01 DIAGNOSIS — Z8673 Personal history of transient ischemic attack (TIA), and cerebral infarction without residual deficits: Secondary | ICD-10-CM | POA: Diagnosis not present

## 2023-06-01 DIAGNOSIS — E1122 Type 2 diabetes mellitus with diabetic chronic kidney disease: Secondary | ICD-10-CM | POA: Diagnosis not present

## 2023-06-01 DIAGNOSIS — M4316 Spondylolisthesis, lumbar region: Secondary | ICD-10-CM | POA: Diagnosis not present

## 2023-06-01 DIAGNOSIS — E1151 Type 2 diabetes mellitus with diabetic peripheral angiopathy without gangrene: Secondary | ICD-10-CM | POA: Diagnosis not present

## 2023-06-01 DIAGNOSIS — M329 Systemic lupus erythematosus, unspecified: Secondary | ICD-10-CM | POA: Diagnosis not present

## 2023-06-01 DIAGNOSIS — M48061 Spinal stenosis, lumbar region without neurogenic claudication: Secondary | ICD-10-CM | POA: Diagnosis not present

## 2023-06-01 DIAGNOSIS — Z981 Arthrodesis status: Secondary | ICD-10-CM | POA: Diagnosis not present

## 2023-06-01 DIAGNOSIS — M1990 Unspecified osteoarthritis, unspecified site: Secondary | ICD-10-CM | POA: Diagnosis not present

## 2023-06-01 DIAGNOSIS — E875 Hyperkalemia: Secondary | ICD-10-CM | POA: Diagnosis not present

## 2023-06-01 DIAGNOSIS — D631 Anemia in chronic kidney disease: Secondary | ICD-10-CM | POA: Diagnosis not present

## 2023-06-01 DIAGNOSIS — M069 Rheumatoid arthritis, unspecified: Secondary | ICD-10-CM | POA: Diagnosis not present

## 2023-06-01 DIAGNOSIS — M5030 Other cervical disc degeneration, unspecified cervical region: Secondary | ICD-10-CM | POA: Diagnosis not present

## 2023-06-01 DIAGNOSIS — M51369 Other intervertebral disc degeneration, lumbar region without mention of lumbar back pain or lower extremity pain: Secondary | ICD-10-CM | POA: Diagnosis not present

## 2023-06-01 DIAGNOSIS — Z7951 Long term (current) use of inhaled steroids: Secondary | ICD-10-CM | POA: Diagnosis not present

## 2023-06-02 DIAGNOSIS — I130 Hypertensive heart and chronic kidney disease with heart failure and stage 1 through stage 4 chronic kidney disease, or unspecified chronic kidney disease: Secondary | ICD-10-CM | POA: Diagnosis not present

## 2023-06-02 DIAGNOSIS — D631 Anemia in chronic kidney disease: Secondary | ICD-10-CM | POA: Diagnosis not present

## 2023-06-02 DIAGNOSIS — I2510 Atherosclerotic heart disease of native coronary artery without angina pectoris: Secondary | ICD-10-CM | POA: Diagnosis not present

## 2023-06-02 DIAGNOSIS — Z8673 Personal history of transient ischemic attack (TIA), and cerebral infarction without residual deficits: Secondary | ICD-10-CM | POA: Diagnosis not present

## 2023-06-02 DIAGNOSIS — M5030 Other cervical disc degeneration, unspecified cervical region: Secondary | ICD-10-CM | POA: Diagnosis not present

## 2023-06-02 DIAGNOSIS — Z4789 Encounter for other orthopedic aftercare: Secondary | ICD-10-CM | POA: Diagnosis not present

## 2023-06-02 DIAGNOSIS — N189 Chronic kidney disease, unspecified: Secondary | ICD-10-CM | POA: Diagnosis not present

## 2023-06-02 DIAGNOSIS — E1151 Type 2 diabetes mellitus with diabetic peripheral angiopathy without gangrene: Secondary | ICD-10-CM | POA: Diagnosis not present

## 2023-06-02 DIAGNOSIS — R011 Cardiac murmur, unspecified: Secondary | ICD-10-CM | POA: Diagnosis not present

## 2023-06-02 DIAGNOSIS — E785 Hyperlipidemia, unspecified: Secondary | ICD-10-CM | POA: Diagnosis not present

## 2023-06-02 DIAGNOSIS — Z79899 Other long term (current) drug therapy: Secondary | ICD-10-CM | POA: Diagnosis not present

## 2023-06-02 DIAGNOSIS — M109 Gout, unspecified: Secondary | ICD-10-CM | POA: Diagnosis not present

## 2023-06-02 DIAGNOSIS — I509 Heart failure, unspecified: Secondary | ICD-10-CM | POA: Diagnosis not present

## 2023-06-02 DIAGNOSIS — M51369 Other intervertebral disc degeneration, lumbar region without mention of lumbar back pain or lower extremity pain: Secondary | ICD-10-CM | POA: Diagnosis not present

## 2023-06-02 DIAGNOSIS — E1122 Type 2 diabetes mellitus with diabetic chronic kidney disease: Secondary | ICD-10-CM | POA: Diagnosis not present

## 2023-06-02 DIAGNOSIS — M329 Systemic lupus erythematosus, unspecified: Secondary | ICD-10-CM | POA: Diagnosis not present

## 2023-06-02 DIAGNOSIS — Z7984 Long term (current) use of oral hypoglycemic drugs: Secondary | ICD-10-CM | POA: Diagnosis not present

## 2023-06-02 DIAGNOSIS — M069 Rheumatoid arthritis, unspecified: Secondary | ICD-10-CM | POA: Diagnosis not present

## 2023-06-02 DIAGNOSIS — M1990 Unspecified osteoarthritis, unspecified site: Secondary | ICD-10-CM | POA: Diagnosis not present

## 2023-06-02 DIAGNOSIS — M4316 Spondylolisthesis, lumbar region: Secondary | ICD-10-CM | POA: Diagnosis not present

## 2023-06-02 DIAGNOSIS — Z7951 Long term (current) use of inhaled steroids: Secondary | ICD-10-CM | POA: Diagnosis not present

## 2023-06-02 DIAGNOSIS — Z981 Arthrodesis status: Secondary | ICD-10-CM | POA: Diagnosis not present

## 2023-06-02 DIAGNOSIS — E875 Hyperkalemia: Secondary | ICD-10-CM | POA: Diagnosis not present

## 2023-06-02 DIAGNOSIS — M48061 Spinal stenosis, lumbar region without neurogenic claudication: Secondary | ICD-10-CM | POA: Diagnosis not present

## 2023-06-02 DIAGNOSIS — E213 Hyperparathyroidism, unspecified: Secondary | ICD-10-CM | POA: Diagnosis not present

## 2023-06-06 ENCOUNTER — Telehealth: Payer: Self-pay

## 2023-06-06 NOTE — Telephone Encounter (Signed)
Patient left voice mail needing meds for infection in ear and blood test results. Please advise.

## 2023-06-09 ENCOUNTER — Ambulatory Visit
Admission: RE | Admit: 2023-06-09 | Discharge: 2023-06-09 | Disposition: A | Discharge status: Home / Self Care | Payer: 59 | Source: Ambulatory Visit | Attending: Orthopedic Surgery | Admitting: Orthopedic Surgery

## 2023-06-09 DIAGNOSIS — M51369 Other intervertebral disc degeneration, lumbar region without mention of lumbar back pain or lower extremity pain: Secondary | ICD-10-CM | POA: Diagnosis not present

## 2023-06-09 DIAGNOSIS — S32039A Unspecified fracture of third lumbar vertebra, initial encounter for closed fracture: Secondary | ICD-10-CM | POA: Diagnosis not present

## 2023-06-09 DIAGNOSIS — M438X6 Other specified deforming dorsopathies, lumbar region: Secondary | ICD-10-CM | POA: Diagnosis not present

## 2023-06-09 DIAGNOSIS — M4316 Spondylolisthesis, lumbar region: Secondary | ICD-10-CM | POA: Insufficient documentation

## 2023-06-09 DIAGNOSIS — M48061 Spinal stenosis, lumbar region without neurogenic claudication: Secondary | ICD-10-CM | POA: Diagnosis not present

## 2023-06-09 DIAGNOSIS — M47816 Spondylosis without myelopathy or radiculopathy, lumbar region: Secondary | ICD-10-CM | POA: Diagnosis not present

## 2023-06-09 DIAGNOSIS — M5136 Other intervertebral disc degeneration, lumbar region with discogenic back pain only: Secondary | ICD-10-CM | POA: Diagnosis not present

## 2023-06-09 DIAGNOSIS — M4856XA Collapsed vertebra, not elsewhere classified, lumbar region, initial encounter for fracture: Secondary | ICD-10-CM | POA: Diagnosis not present

## 2023-06-09 DIAGNOSIS — Z981 Arthrodesis status: Secondary | ICD-10-CM | POA: Diagnosis not present

## 2023-06-13 ENCOUNTER — Ambulatory Visit: Payer: 59 | Admitting: Internal Medicine

## 2023-06-14 ENCOUNTER — Ambulatory Visit: Payer: Self-pay

## 2023-06-14 NOTE — Patient Instructions (Signed)
Visit Information  Thank you for taking time to visit with me today. Please don't hesitate to contact me if I can be of assistance to you.   Following are the goals we discussed today:   Goals Addressed             This Visit's Progress    Patient Stated:  " I want to manage my arthritis pain and health conditions"       Interventions Today    Flowsheet Row Most Recent Value  Chronic Disease   Chronic disease during today's visit Other  [status lumbar laminectom/ ongoing low back pain.]  General Interventions   General Interventions Discussed/Reviewed General Interventions Reviewed, Doctor Visits  [evaluation of current treatment plan for mentioned health conditions and patients adherence to plan as established by provider. Assessed for ongoing back pain/ symptoms.]  Doctor Visits Discussed/Reviewed Doctor Visits Reviewed  Annabell Sabal upcoming provider visits.  Advised to keep follow up visits with providers as recommended.]  Exercise Interventions   Exercise Discussed/Reviewed Physical Activity  [discussed patients current activity level.]  Education Interventions   Education Provided Provided Education  [Advised to apply warm and/ or cold pack to back to help with pain management.]  Pharmacy Interventions   Pharmacy Dicussed/Reviewed Pharmacy Topics Reviewed  [medications reviewed and compliance discussed and advised. Assessed patients pain level using prescribed pain medication.]              Our next appointment is by telephone on 07/19/23 at 2 pm  Please call the care guide team at [phone] if you need to cancel or reschedule your appointment.   If you are experiencing a Mental Health or Behavioral Health Crisis or need someone to talk to, please call the Suicide and Crisis Lifeline: 988 call 1-800-273-TALK (toll free, 24 hour hotline)  Patient verbalizes understanding of instructions and care plan provided today and agrees to view in MyChart. Active MyChart status  and patient understanding of how to access instructions and care plan via MyChart confirmed with patient.     George Ina RN,BSN,CCM [place]  [facility], [facility] coordinator / Case Manager Phone: [phone]

## 2023-06-14 NOTE — Patient Outreach (Signed)
  Care Coordination   Follow Up Visit Note   06/14/2023 Name: [name] MRN: [id number] DOB: 10-08-1945  [name] is a 77 y.o. year old female who sees Margaretann Loveless, MD for primary care. I spoke with  [name] by phone today.  What matters to the patients health and wellness today?  Patient states she continues to having ongoing back pain below the waist in mid back.  She states current pain level is a 5.  She states when taking her pain medication it causes her to sleep but not really relieving the pain.  Patient states she is still not able to stand for long periods of time.  Patient reports having MRI/CT of her back. She states she is scheduled to have a CT of her neck on 06/21/23.      Goals Addressed             This Visit's Progress    Patient Stated:  " I want to manage my arthritis pain and health conditions"       Interventions Today    Flowsheet Row Most Recent Value  Chronic Disease   Chronic disease during today's visit Other  [status lumbar laminectom/ ongoing low back pain.]  General Interventions   General Interventions Discussed/Reviewed General Interventions Reviewed, Doctor Visits  [evaluation of current treatment plan for mentioned health conditions and patients adherence to plan as established by provider. Assessed for ongoing back pain/ symptoms.]  Doctor Visits Discussed/Reviewed Doctor Visits Reviewed  Annabell Sabal upcoming provider visits.  Advised to keep follow up visits with providers as recommended.]  Exercise Interventions   Exercise Discussed/Reviewed Physical Activity  [discussed patients current activity level.]  Education Interventions   Education Provided Provided Education  [Advised to apply warm and/ or cold pack to back to help with pain management.]  Pharmacy Interventions   Pharmacy Dicussed/Reviewed Pharmacy Topics Reviewed  [medications reviewed and compliance discussed and advised. Assessed patients pain level  using prescribed pain medication.]              SDOH assessments and interventions completed:  No     Care Coordination Interventions:  Yes, provided   Follow up plan: Follow up call scheduled for 07/18/22    Encounter Outcome:  Patient Visit Completed   George Ina RN,BSN,CCM [facility]  [facility], [facility] coordinator / Case Manager Phone: [phone]

## 2023-06-21 ENCOUNTER — Other Ambulatory Visit: Payer: Self-pay | Admitting: Internal Medicine

## 2023-06-21 ENCOUNTER — Ambulatory Visit: Payer: 59

## 2023-06-21 ENCOUNTER — Other Ambulatory Visit: Payer: Self-pay | Admitting: Orthopedic Surgery

## 2023-06-21 DIAGNOSIS — E1165 Type 2 diabetes mellitus with hyperglycemia: Secondary | ICD-10-CM

## 2023-06-21 DIAGNOSIS — M3214 Glomerular disease in systemic lupus erythematosus: Secondary | ICD-10-CM

## 2023-06-21 DIAGNOSIS — M25561 Pain in right knee: Secondary | ICD-10-CM

## 2023-06-21 DIAGNOSIS — Z0000 Encounter for general adult medical examination without abnormal findings: Secondary | ICD-10-CM

## 2023-06-21 DIAGNOSIS — D508 Other iron deficiency anemias: Secondary | ICD-10-CM | POA: Diagnosis not present

## 2023-06-21 DIAGNOSIS — E782 Mixed hyperlipidemia: Secondary | ICD-10-CM | POA: Diagnosis not present

## 2023-06-21 DIAGNOSIS — M542 Cervicalgia: Secondary | ICD-10-CM

## 2023-06-21 DIAGNOSIS — E1159 Type 2 diabetes mellitus with other circulatory complications: Secondary | ICD-10-CM | POA: Diagnosis not present

## 2023-06-21 DIAGNOSIS — I152 Hypertension secondary to endocrine disorders: Secondary | ICD-10-CM

## 2023-06-21 DIAGNOSIS — M0579 Rheumatoid arthritis with rheumatoid factor of multiple sites without organ or systems involvement: Secondary | ICD-10-CM

## 2023-06-21 DIAGNOSIS — Z1231 Encounter for screening mammogram for malignant neoplasm of breast: Secondary | ICD-10-CM

## 2023-06-22 LAB — CMP14+EGFR
ALT: 11 [IU]/L (ref 0–32)
AST: 13 [IU]/L (ref 0–40)
Albumin: 3.9 g/dL (ref 3.8–4.8)
Alkaline Phosphatase: 85 [IU]/L (ref 44–121)
BUN/Creatinine Ratio: 22 (ref 12–28)
BUN: 55 mg/dL — ABNORMAL HIGH (ref 8–27)
Bilirubin Total: 0.5 mg/dL (ref 0.0–1.2)
CO2: 18 mmol/L — ABNORMAL LOW (ref 20–29)
Calcium: 9.3 mg/dL (ref 8.7–10.3)
Chloride: 117 mmol/L (ref 96–106)
Creatinine, Ser: 2.47 mg/dL — ABNORMAL HIGH (ref 0.57–1.00)
Globulin, Total: 1.9 g/dL (ref 1.5–4.5)
Glucose: 72 mg/dL (ref 70–99)
Potassium: 4.5 mmol/L (ref 3.5–5.2)
Sodium: 147 mmol/L — ABNORMAL HIGH (ref 134–144)
Total Protein: 5.8 g/dL — ABNORMAL LOW (ref 6.0–8.5)
eGFR: 20 mL/min/{1.73_m2} — ABNORMAL LOW (ref >59)

## 2023-06-22 LAB — HEMOGLOBIN A1C
Est. average glucose Bld gHb Est-mCnc: 140 mg/dL
Hgb A1c MFr Bld: 6.5 % — ABNORMAL HIGH (ref 4.8–5.6)

## 2023-06-22 LAB — CBC WITH DIFFERENTIAL/PLATELET
Basophils Absolute: 0 10*3/uL (ref 0.0–0.2)
Basos: 1 %
EOS (ABSOLUTE): 0.2 10*3/uL (ref 0.0–0.4)
Eos: 4 %
Hematocrit: 29.1 % — ABNORMAL LOW (ref 34.0–46.6)
Hemoglobin: 9 g/dL — ABNORMAL LOW (ref 11.1–15.9)
Immature Grans (Abs): 0 10*3/uL (ref 0.0–0.1)
Immature Granulocytes: 0 %
Lymphocytes Absolute: 1.6 10*3/uL (ref 0.7–3.1)
Lymphs: 30 %
MCH: 28.8 pg (ref 26.6–33.0)
MCHC: 30.9 g/dL — ABNORMAL LOW (ref 31.5–35.7)
MCV: 93 fL (ref 79–97)
Monocytes Absolute: 0.3 10*3/uL (ref 0.1–0.9)
Monocytes: 6 %
Neutrophils Absolute: 3 10*3/uL (ref 1.4–7.0)
Neutrophils: 59 %
Platelets: 173 10*3/uL (ref 150–450)
RBC: 3.12 x10E6/uL — ABNORMAL LOW (ref 3.77–5.28)
RDW: 13.6 % (ref 11.7–15.4)
WBC: 5.2 10*3/uL (ref 3.4–10.8)

## 2023-06-22 LAB — LIPID PANEL W/O CHOL/HDL RATIO
Cholesterol, Total: 195 mg/dL (ref 100–199)
HDL: 38 mg/dL — ABNORMAL LOW (ref >39)
LDL Chol Calc (NIH): 129 mg/dL — ABNORMAL HIGH (ref 0–99)
Triglycerides: 154 mg/dL — ABNORMAL HIGH (ref 0–149)
VLDL Cholesterol Cal: 28 mg/dL (ref 5–40)

## 2023-06-23 ENCOUNTER — Ambulatory Visit: Payer: 59 | Admitting: Internal Medicine

## 2023-06-24 ENCOUNTER — Ambulatory Visit (INDEPENDENT_AMBULATORY_CARE_PROVIDER_SITE_OTHER): Payer: 59 | Admitting: Internal Medicine

## 2023-06-24 ENCOUNTER — Encounter: Payer: Self-pay | Admitting: Internal Medicine

## 2023-06-24 VITALS — BP 130/80 | HR 81 | Ht 63.0 in | Wt 180.0 lb

## 2023-06-24 DIAGNOSIS — M0579 Rheumatoid arthritis with rheumatoid factor of multiple sites without organ or systems involvement: Secondary | ICD-10-CM

## 2023-06-24 DIAGNOSIS — K8020 Calculus of gallbladder without cholecystitis without obstruction: Secondary | ICD-10-CM

## 2023-06-24 DIAGNOSIS — E119 Type 2 diabetes mellitus without complications: Secondary | ICD-10-CM

## 2023-06-24 DIAGNOSIS — E1165 Type 2 diabetes mellitus with hyperglycemia: Secondary | ICD-10-CM | POA: Diagnosis not present

## 2023-06-24 DIAGNOSIS — N184 Chronic kidney disease, stage 4 (severe): Secondary | ICD-10-CM

## 2023-06-24 DIAGNOSIS — R1011 Right upper quadrant pain: Secondary | ICD-10-CM | POA: Diagnosis not present

## 2023-06-24 DIAGNOSIS — I10 Essential (primary) hypertension: Secondary | ICD-10-CM

## 2023-06-24 DIAGNOSIS — E1159 Type 2 diabetes mellitus with other circulatory complications: Secondary | ICD-10-CM

## 2023-06-24 LAB — GLUCOSE, POCT (MANUAL RESULT ENTRY): POC Glucose: 137 mg/dL — AB (ref 70–99)

## 2023-06-24 NOTE — Progress Notes (Signed)
 Established Patient Office Visit  Subjective:  Patient ID: CHIKA CICHOWSKI, female    DOB: 06-Nov-1945  Age: 78 y.o. MRN: [id number]  Chief Complaint  Patient presents with   Follow-up    2 weeks    Patient comes in for her follow-up today.  She had a CT scan of the neck for left-sided pain but it is negative.  She also has an appointment with the orthopedic for her right knee pain.  She has been evaluated by the neurosurgeon for her chronic lower back pain.  Today complains of right upper quadrant pain radiating to the right back.  She does have a history of gallstones.  There is a mass in the head of pancreas which was unchanged on CT done in September/2023. Will schedule an ultrasound of the gallbladder, may consider HIDA scan. Labs discussed, needs to increase her water  intake.  Will repeat her labs in 2 weeks.    No other concerns at this time.   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 b.)07/18/2006 -endovas oblit complex ACOM aneur. c.) Endovas Tx of 6.8x39mm RMCA aneur. d.) 3.5x31mm remnant RMCA aneur 2/2 coil compaction. e.) Interval 3.2x3.51mm saccular outpouching in ACOM c/w mild recannulization in neck. 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 g.) 3.7x22mm remnant of prev Tx'd ACOM aneur -failed embol 02/24/2012.   CKD (chronic kidney disease), stage IV (HCC)    a.) solitary functioning kidney on the RIGHT   Complication of anesthesia    a.) delayed emergence   Coronary artery disease 02/23/2007   a.) LHC 02/23/2007 --> EF 50%; 30% pLAD, 70% mRCA --> planned for staged PCI. b.) PCI 02/27/2007: EF 60%; 3.5 x 15 mm Vision BMS to 80% mRCA. b.) LHC 07/24/2007: EF 60%; minor irregs; no occlusive CAD; no intervention. c.) LHC 10/28/2010: EF 60%; 30% mLAD, LCx with minor luminal irregs, 20% ISR mRCA; no intervention. d.) LHC 05/31/2013: EF  60%; 40% mLAD, 30% ISR m-dRCA; no interventions.   DDD (degenerative disc disease), cervical    a.) s/p ACDF C5-C7; hardware in neck; patient appreciates stiffness and issues with mobility   Diastolic dysfunction    a.)  TTE 07/07/2021: EF 87.8%; normal PASP; trace TR/MR; G1DD.   Elevated LEFT hemidiaphragm    Gout    Headache(784.0)    Heart murmur    HLD (hyperlipidemia)    HOH (hard of hearing)    Has hearing aids, doesn't wear   Hyperkalemia    Hyperparathyroidism due to renal insufficiency (HCC)    Hypertension    Incomplete right bundle branch block (RBBB)    Insomnia    a.) takes zolpidem    Long term (current) use of immunomodulator    a.) on DMARD therapy (hydroxychloroquine ) for RA/SLE Dx.   Lumbar adjacent segment disease with spondylolisthesis    Multiple acquired cysts of kidney    On chronic clopidogrel  therapy    OSA (obstructive sleep apnea)    a.) does NOT use nocturnal pap therapy   Osteoarthritis    Pancreatic cyst    Peripheral vascular disease (HCC)    Pneumonia    Post-COVID chronic cough    Pulmonary nodule    RBBB (right bundle branch block)    Rectal bleeding    Rheumatoid arthritis (HCC)    a.) on DMARD; hydroxychloriquine   Shortness of  breath 03/01/2023   Systemic lupus erythematosus (HCC)    T2DM (type 2 diabetes mellitus) (HCC)    Tendinitis of left wrist    Thickened endometrium    Wears dentures    partial lower    Past Surgical History:  Procedure Laterality Date   ANEURYSM COILING  08/2013   ANTERIOR LATERAL LUMBAR FUSION WITH PERCUTANEOUS SCREW 1 LEVEL N/A 03/09/2023   Procedure: L3-4 LATERAL LUMBAR INTERBODY FUSION;  Surgeon: Clois Fret, MD;  Location: [facility];  Service: Neurosurgery;  Laterality: N/A;   APPLICATION OF INTRAOPERATIVE CT SCAN N/A 03/09/2023   Procedure: APPLICATION OF INTRAOPERATIVE CT SCAN;  Surgeon: Clois Fret, MD;  Location: [facility];  Service: Neurosurgery;  Laterality: N/A;   Attempted  embolization of previously treated ACOM aneurysm; unsuccessful N/A 01/28/2012   Location: [facility]   BACK SURGERY     BARTHOLIN CYST MARSUPIALIZATION     BICEPT TENODESIS Right 03/02/2022   Procedure: BICEPS TENODESIS;  Surgeon: Edie Norleen PARAS, MD;  Location: [facility];  Service: Orthopedics;  Laterality: Right;   BREAST CYST ASPIRATION Right 01/25/2012   FNA neg.   BREAST EXCISIONAL BIOPSY Left 06/26/2007   neg   BUNIONECTOMY Bilateral 2022   CARDIAC CATHETERIZATION Left 02/23/2007   Procedure: CARDIAC CATHETERIZATION; Location: [facility]; Surgeon: Denyse Bathe, MD   CARDIAC CATHETERIZATION Left 07/31/2007   Procedure: CARDIAC CATHETERIZATION; Location: [facility]; Surgeon: Denyse Bathe, MD   CARDIAC CATHETERIZATION Left 10/28/2010   Procedure: CARDIAC CATHETERIZATION; Location: [facility]; Surgeon: Denyse Bathe, MD   CARDIAC CATHETERIZATION Left 05/31/2013   Procedure: CARDIAC CATHETERIZATION; Location: [facility]; Surgeon: Denyse Bathe, MD   Carotid arteriogram; 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 N/A 07/18/2006   Location: [facility]   CATARACT EXTRACTION     CERVICAL FUSION N/A    Procedure: ACDF C5-C7   COLONOSCOPY WITH PROPOFOL  N/A 10/04/2019   Procedure: COLONOSCOPY WITH BIOPSY;  Surgeon: Unk Corinn Skiff, MD;  Location: [facility];  Service: Endoscopy;  Laterality: N/A;  Diabetic (borderline) - oral meds priority 3   CORONARY STENT INTERVENTION Left 02/27/2007   Procedure: CORONARY STENT INTERVENTION (3.5 x 15 mm Vision BMS to mRCA); Location: [facility]; Surgeon: Deatrice Cage, MD   Endovascular near complete obliteration of enlarging neck remnant of previously treated RIGHT MCA aneurysm using stent assisted coiling N/A 09/03/2010   Location: [facility]   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 N/A 10/03/2006   Location: [facility]   HYSTEROSCOPY WITH D & C N/A 08/04/2021   Procedure:  DILATATION AND CURETTAGE /HYSTEROSCOPY;  Surgeon: Victor Claudell SAUNDERS, MD;  Location: [facility];  Service: Gynecology;  Laterality: N/A;   POLYPECTOMY N/A 10/04/2019   Procedure: POLYPECTOMY;  Surgeon: Unk Corinn Skiff, MD;  Location: [facility];  Service: Endoscopy;  Laterality: N/A;   REVERSE SHOULDER ARTHROPLASTY Right 03/02/2022   Procedure: REVERSE SHOULDER ARTHROPLASTY;  Surgeon: Edie Norleen PARAS, MD;  Location: [facility];  Service: Orthopedics;  Laterality: Right;   UPPER ESOPHAGEAL ENDOSCOPIC ULTRASOUND (EUS) N/A 04/15/2016   Procedure: UPPER ESOPHAGEAL ENDOSCOPIC ULTRASOUND (EUS);  Surgeon: Asberry DELENA Coffee, MD;  Location: [facility];  Service: Gastroenterology;  Laterality: N/A;    Social History   Socioeconomic History   Marital status: Widowed    Spouse name: Not on file   Number of children: Not on file   Years of education: Not on file   Highest education level: Not on file  Occupational History   Not on file  Tobacco Use   Smoking status: Never   Smokeless tobacco: Never  Vaping Use   Vaping status: Never Used  Substance and Sexual Activity   Alcohol use: Yes    Comment: Occasionally at christmas   Drug use: No   Sexual activity: Not Currently    Birth control/protection: Post-menopausal  Other Topics Concern   Not on file  Social History Narrative   Lives with daughter   Social Drivers of Health   Financial Resource Strain: Low Risk  (02/12/2022)   Overall Financial Resource Strain (CARDIA)    Difficulty of Paying Living Expenses: Not very hard  Food Insecurity: No Food Insecurity (03/09/2023)   Hunger Vital Sign    Worried About Running Out of Food in the Last Year: Never true    Ran Out of Food in the Last Year: Never true  Transportation Needs: No Transportation Needs (03/09/2023)   PRAPARE - Administrator, Civil Service (Medical): No    Lack of Transportation (Non-Medical): No  Physical Activity: Not on file  Stress: No  Stress Concern Present (02/26/2022)   [employer] of Occupational Health - Occupational Stress Questionnaire    Feeling of Stress : Not at all  Social Connections: Moderately Isolated (02/26/2022)   Social Connection and Isolation Panel [NHANES]    Frequency of Communication with Friends and Family: More than three times a week    Frequency of Social Gatherings with Friends and Family: More than three times a week    Attends Religious Services: Never    Database Administrator or Organizations: Yes    Attends Banker Meetings: Never    Marital Status: Widowed  Intimate Partner Violence: Not At Risk (03/09/2023)   Humiliation, Afraid, Rape, and Kick questionnaire    Fear of Current or Ex-Partner: No    Emotionally Abused: No    Physically Abused: No    Sexually Abused: No    Family History  Problem Relation Age of Onset   Breast cancer Paternal Aunt 58   Breast cancer Paternal Aunt    Heart disease Mother    Heart disease Father    Stroke Father    Diabetes Sister    Diabetes Brother     Allergies  Allergen Reactions   Isosorbide Dinitrate Other (See Comments)    Collapse   Amlodipine Swelling   Sulfa Antibiotics Itching   Tizanidine Hcl    Oxycodone  Itching   Sulfasalazine Itching    Outpatient Medications Prior to Visit  Medication Sig   allopurinol  (ZYLOPRIM ) 100 MG tablet TAKE 1 TABLET BY MOUTH EVERY DAY   clotrimazole -betamethasone (LOTRISONE) cream APPLY TWICE A DAY TO GROIN AREA SKIN FOR 1 WEEK AND THEN AS NEEDED.   fluticasone  (FLONASE ) 50 MCG/ACT nasal spray SPRAY 2 SPRAYS INTO EACH NOSTRIL EVERY DAY (Patient taking differently: Place 1 spray into both nostrils daily as needed.)   furosemide  (LASIX ) 40 MG tablet TAKE 1 TABLET BY MOUTH EVERY DAY   gabapentin  (NEURONTIN ) 300 MG capsule Take 1 capsule (300 mg total) by mouth at bedtime.   glimepiride  (AMARYL ) 2 MG tablet Take 1 tablet (2 mg total) by mouth See admin instructions. Take 1 mg daily, may  increase to 2 mg if blood sugar is over 175 (Patient taking differently: Take 2 mg by mouth daily with breakfast.)   hydrALAZINE  (APRESOLINE ) 100 MG tablet TAKE 1 TABLET BY MOUTH TWICE A DAY (Patient taking differently: Take 100 mg by mouth 2 (two) times daily. Takes/1/2  tablet BID)   hydroxychloroquine  (PLAQUENIL ) 200 MG tablet Take 200 mg by mouth daily.   labetalol  (NORMODYNE ) 300 MG tablet TAKE 1 TABLET BY MOUTH TWICE A DAY   losartan  (COZAAR ) 100 MG tablet Take 1 tablet (100 mg total) by mouth every morning.   methocarbamol  (ROBAXIN ) 500 MG tablet TAKE 1 TABLET BY MOUTH EVERY 6 HOURS AS NEEDED FOR MUSCLE SPASMS.   pantoprazole (PROTONIX) 40 MG tablet TAKE 1 TABLET BY MOUTH EVERY DAY   rosuvastatin  (CRESTOR ) 40 MG tablet TAKE 1 TABLET BY MOUTH EVERY DAY   senna (SENOKOT) 8.6 MG TABS tablet Take 1 tablet (8.6 mg total) by mouth daily as needed for mild constipation.   sodium zirconium cyclosilicate (LOKELMA) 10 g PACK packet Take 10 g by mouth as needed.   SYMBICORT 80-4.5 MCG/ACT inhaler INHALE 1 PUFF BY MOUTH TWICE A DAY   traMADol  (ULTRAM ) 50 MG tablet Take 1 tablet (50 mg total) by mouth every 6 (six) hours as needed for moderate pain (pain score 4-6).   tretinoin (RETIN-A) 0.025 % cream TWICE A DAY   zolpidem  (AMBIEN ) 5 MG tablet Take 1 tablet (5 mg total) by mouth at bedtime as needed for sleep.   No facility-administered medications prior to visit.    Review of Systems  Constitutional: Negative.  Negative for chills, diaphoresis, fever, malaise/fatigue and weight loss.  HENT: Negative.  Negative for congestion and nosebleeds.   Eyes: Negative.   Respiratory: Negative.  Negative for cough, shortness of breath and stridor.   Cardiovascular: Negative.  Negative for chest pain, palpitations and leg swelling.  Gastrointestinal:  Positive for abdominal pain (RUQ). Negative for constipation, diarrhea, heartburn, nausea and vomiting.  Genitourinary: Negative.  Negative for dysuria and  flank pain.  Musculoskeletal: Negative.  Negative for joint pain and myalgias.  Skin: Negative.   Neurological: Negative.  Negative for dizziness, tingling, tremors and headaches.  Endo/Heme/Allergies: Negative.   Psychiatric/Behavioral: Negative.  Negative for depression and suicidal ideas. The patient is not nervous/anxious.        Objective:   BP 130/80   Pulse 81   Ht 5' 3 (1.6 m)   Wt 180 lb (81.6 kg)   SpO2 96%   BMI 31.89 kg/m   Vitals:   06/24/23 1149  BP: 130/80  Pulse: 81  Height: 5' 3 (1.6 m)  Weight: 180 lb (81.6 kg)  SpO2: 96%  BMI (Calculated): 31.89    Physical Exam Vitals and nursing note reviewed.  Constitutional:      Appearance: Normal appearance.  HENT:     Head: Normocephalic and atraumatic.     Nose: Nose normal.     Mouth/Throat:     Mouth: Mucous membranes are moist.     Pharynx: Oropharynx is clear.  Eyes:     Conjunctiva/sclera: Conjunctivae normal.     Pupils: Pupils are equal, round, and reactive to light.  Cardiovascular:     Rate and Rhythm: Normal rate and regular rhythm.     Pulses: Normal pulses.     Heart sounds: Normal heart sounds. No murmur heard. Pulmonary:     Effort: Pulmonary effort is normal.     Breath sounds: Normal breath sounds. No wheezing.  Abdominal:     General: Bowel sounds are normal.     Palpations: Abdomen is soft.     Tenderness: There is no abdominal tenderness. There is no right CVA tenderness or left CVA tenderness.  Musculoskeletal:        General: Normal range  of motion.     Cervical back: Normal range of motion.     Right lower leg: No edema.     Left lower leg: No edema.  Skin:    General: Skin is warm and dry.  Neurological:     General: No focal deficit present.     Mental Status: She is alert and oriented to person, place, and time.  Psychiatric:        Mood and Affect: Mood normal.        Behavior: Behavior normal.      Results for orders placed or performed in visit on 06/24/23   POCT Glucose (CBG)  Result Value Ref Range   POC Glucose 137 (A) 70 - 99 mg/dl    Recent Results (from the past 2160 hours)  POCT Glucose (CBG)     Status: Abnormal   Collection Time: 05/30/23  1:15 PM  Result Value Ref Range   POC Glucose 152 (A) 70 - 99 mg/dl  CBC with Diff     Status: Abnormal   Collection Time: 06/21/23  9:10 AM  Result Value Ref Range   WBC 5.2 3.4 - 10.8 x10E3/uL   RBC 3.12 (L) 3.77 - 5.28 x10E6/uL   Hemoglobin 9.0 (L) 11.1 - 15.9 g/dL   Hematocrit 70.8 (L) 65.9 - 46.6 %   MCV 93 79 - 97 fL   MCH 28.8 26.6 - 33.0 pg   MCHC 30.9 (L) 31.5 - 35.7 g/dL   RDW 86.3 88.2 - 84.5 %   Platelets 173 150 - 450 x10E3/uL   Neutrophils 59 Not Estab. %   Lymphs 30 Not Estab. %   Monocytes 6 Not Estab. %   Eos 4 Not Estab. %   Basos 1 Not Estab. %   Neutrophils Absolute 3.0 1.4 - 7.0 x10E3/uL   Lymphocytes Absolute 1.6 0.7 - 3.1 x10E3/uL   Monocytes Absolute 0.3 0.1 - 0.9 x10E3/uL   EOS (ABSOLUTE) 0.2 0.0 - 0.4 x10E3/uL   Basophils Absolute 0.0 0.0 - 0.2 x10E3/uL   Immature Granulocytes 0 Not Estab. %   Immature Grans (Abs) 0.0 0.0 - 0.1 x10E3/uL  CMP14+EGFR     Status: Abnormal   Collection Time: 06/21/23  9:10 AM  Result Value Ref Range   Glucose 72 70 - 99 mg/dL   BUN 55 (H) 8 - 27 mg/dL   Creatinine, Ser 7.52 (H) 0.57 - 1.00 mg/dL   eGFR 20 (L) >40 fO/fpw/8.26   BUN/Creatinine Ratio 22 12 - 28   Sodium 147 (H) 134 - 144 mmol/L   Potassium 4.5 3.5 - 5.2 mmol/L   Chloride 117 (HH) 96 - 106 mmol/L   CO2 18 (L) 20 - 29 mmol/L   Calcium  9.3 8.7 - 10.3 mg/dL   Total Protein 5.8 (L) 6.0 - 8.5 g/dL   Albumin  3.9 3.8 - 4.8 g/dL   Globulin, Total 1.9 1.5 - 4.5 g/dL   Bilirubin Total 0.5 0.0 - 1.2 mg/dL   Alkaline Phosphatase 85 44 - 121 IU/L   AST 13 0 - 40 IU/L   ALT 11 0 - 32 IU/L  Hemoglobin A1c     Status: Abnormal   Collection Time: 06/21/23  9:10 AM  Result Value Ref Range   Hgb A1c MFr Bld 6.5 (H) 4.8 - 5.6 %    Comment:          Prediabetes: 5.7 -  6.4          Diabetes: >6.4  Glycemic control for adults with diabetes: <7.0    Est. average glucose Bld gHb Est-mCnc 140 mg/dL  Lipid Panel w/o Chol/HDL Ratio     Status: Abnormal   Collection Time: 06/21/23  9:10 AM  Result Value Ref Range   Cholesterol, Total 195 100 - 199 mg/dL   Triglycerides 845 (H) 0 - 149 mg/dL   HDL 38 (L) >60 mg/dL   VLDL Cholesterol Cal 28 5 - 40 mg/dL   LDL Chol Calc (NIH) 870 (H) 0 - 99 mg/dL  POCT Glucose (CBG)     Status: Abnormal   Collection Time: 06/24/23 11:54 AM  Result Value Ref Range   POC Glucose 137 (A) 70 - 99 mg/dl      Assessment & Plan:  Patient will continue her current medications.  Schedule liver ultrasound.  Repeat labs at follow-up. Problem List Items Addressed This Visit     Diabetes mellitus without complication (HCC)   Relevant Orders   POCT Glucose (CBG) (Completed)   Rheumatoid arthritis involving multiple sites with positive rheumatoid factor (HCC)   Type 2 diabetes mellitus with hyperglycemia, without long-term current use of insulin  (HCC)   Relevant Orders   POCT Glucose (CBG) (Completed)   Hypertension associated with diabetes (HCC) - Primary   Relevant Orders   Basic metabolic panel   Chronic kidney disease, stage IV (severe) (HCC)   Other Visit Diagnoses       RUQ pain       Relevant Orders   US  Abdomen Complete     Gall stones       Relevant Orders   US  Abdomen Complete       Return in about 6 weeks (around 08/05/2023).   Total time spent: 30 minutes  FERNAND FREDY RAMAN, MD  06/24/2023   This document may have been prepared by [facility] Voice Recognition software and as such may include unintentional dictation errors.

## 2023-06-24 NOTE — Progress Notes (Signed)
 Patient notified

## 2023-06-27 ENCOUNTER — Other Ambulatory Visit: Payer: Self-pay | Admitting: Neurosurgery

## 2023-06-27 ENCOUNTER — Other Ambulatory Visit: Payer: Self-pay | Admitting: Internal Medicine

## 2023-06-27 ENCOUNTER — Other Ambulatory Visit: Payer: Self-pay | Admitting: Family

## 2023-06-27 ENCOUNTER — Other Ambulatory Visit: Payer: Self-pay | Admitting: Physician Assistant

## 2023-06-27 DIAGNOSIS — F5101 Primary insomnia: Secondary | ICD-10-CM

## 2023-06-27 NOTE — Telephone Encounter (Signed)
 I think Nehemiah Settle already denied this prescription. At this point, she should get any further ultram from her PCP.

## 2023-06-28 ENCOUNTER — Encounter: Payer: Self-pay | Admitting: Orthopedic Surgery

## 2023-06-28 NOTE — Progress Notes (Signed)
 Her lumbar MRI and CT scan were reviewed with Dr. Myer Haff. Will make her follow up to see him to review and discuss options.

## 2023-06-29 DIAGNOSIS — M170 Bilateral primary osteoarthritis of knee: Secondary | ICD-10-CM | POA: Diagnosis not present

## 2023-06-30 ENCOUNTER — Ambulatory Visit (INDEPENDENT_AMBULATORY_CARE_PROVIDER_SITE_OTHER): Payer: 59 | Admitting: Neurosurgery

## 2023-06-30 VITALS — BP 122/82 | Ht 63.0 in | Wt 180.4 lb

## 2023-06-30 DIAGNOSIS — M25561 Pain in right knee: Secondary | ICD-10-CM

## 2023-06-30 DIAGNOSIS — M51369 Other intervertebral disc degeneration, lumbar region without mention of lumbar back pain or lower extremity pain: Secondary | ICD-10-CM

## 2023-06-30 DIAGNOSIS — G894 Chronic pain syndrome: Secondary | ICD-10-CM

## 2023-06-30 DIAGNOSIS — M4316 Spondylolisthesis, lumbar region: Secondary | ICD-10-CM

## 2023-06-30 DIAGNOSIS — Z981 Arthrodesis status: Secondary | ICD-10-CM

## 2023-06-30 DIAGNOSIS — M25562 Pain in left knee: Secondary | ICD-10-CM

## 2023-06-30 DIAGNOSIS — G8929 Other chronic pain: Secondary | ICD-10-CM

## 2023-06-30 NOTE — Progress Notes (Signed)
 REFERRING PHYSICIAN:  Fernand Fredy RAMAN Md [address] [place],  [place] [address]  DOS: 03/09/23  XLIF L3-L4 with PSF L2-L5  HISTORY OF PRESENT ILLNESS: [name] is status post above surgery.  She continues to have pain in her right buttock worse than the left.    PHYSICAL EXAMINATION:  General: Patient is well developed, well nourished, calm, collected, and in no apparent distress.   NEUROLOGICAL:  General: In no acute distress.   Awake, alert, oriented to person, place, and time.  Pupils equal round and reactive to light.  Facial tone is symmetric.     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 Progressive superior endplate fracture at L3 since the CT scan of 03/13/2023. MRI of the same day demonstrates no significant edema at this level. 2. No significant osseous fusion at L3-4. 3. Progressive endplate sclerotic changes on the left at L1-2 with increased lucency about the left pedicle screw. The anterior aspect of the left pedicle screw now extends into the L1-2 disc space. Findings are compatible with hardware loosening. 4. Solid fusion at L4-5 and L5-S1 without residual or recurrent stenosis. 5. Mild osseous foraminal narrowing bilaterally at L4-5. 6. Right foraminal narrowing at T11-12 and T12-L1. 7.  Aortic Atherosclerosis (ICD10-I70.0).     Electronically Signed   By: Lonni Necessary M.D.   On: 06/28/2023 07:23  MRI L Spine 06/09/2023 IMPRESSION: 1. Comparison is made to the prior lumbar spine MRI of 11/25/2022. 2. 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 3. At L2-L3, there has been interval posterior fusion.  Better appreciated on the same day lumbar spine CT, there is lucency surrounding the left L2 pedicle screw compatible with loosening, and this screw extends slightly through the L2 superior endplate. 4. At L3-L4, there has been interval posterior decompression and posterior spinal fusion. Multifactorial mild-to-moderate central canal stenosis, improved. Mild left subarticular stenosis, improved. Mild left neural foraminal stenosis, improved. 5. No more than mild central canal/subarticular or neural foraminal stenosis at the remaining levels. 6. 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 7. L3 vertebral compression fracture (with up to 60% height loss), chronic but new from the prior MRI.     Electronically Signed   By: Rockey Childs D.O.   On: 06/27/2023 13:34  ASSESSMENT/PLAN:  [name] is doing poorly.  She has bilateral knee pain, as well as continued back pain.  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  I do not think that a larger thoracolumbar fusion is a great idea for her.  I have recommended that she consider a spinal cord stimulator.  I will refer her for the appropriate MRI and psychology evaluation.  I will refer her to orthopedics for her knees.    I spent a total of 10 minutes in this patient's care today. This time was spent reviewing pertinent records including imaging studies, obtaining and confirming history, performing a directed evaluation, formulating and discussing my recommendations, and documenting the visit within the medical record.  Cathy Daisy MD Department of neurosurgery

## 2023-07-08 DIAGNOSIS — E119 Type 2 diabetes mellitus without complications: Secondary | ICD-10-CM | POA: Diagnosis not present

## 2023-07-19 ENCOUNTER — Ambulatory Visit: Payer: Self-pay

## 2023-07-19 NOTE — Patient Outreach (Signed)
  Care Coordination   Follow Up Visit Note   07/19/2023 Name: [name] MRN: [id number] DOB: 03-Apr-1946  [name] is a 78 y.o. year old female who sees Margaretann Loveless, MD for primary care. I spoke with  [name] by phone today.  What matters to the patients health and wellness today?  Patient states she continues to have mid back / buttock pain. She states her pain level is 0 when sitting however it can be 8 to 9 when standing/ walking. Patient states her MRI/ CT revealed she has a loose screw.  Patient states she has not decided if she wants to have surgery again because it's such a long recovery.  Patient states the orthopedic surgeon discussed having a spinal cord stimulator place.  She states she is not sure she wants to do this because she has already gone through a lot.   Patient states she is scheduled to see her primary care provider on 08/05/23. She states she will discuss this further with her primary provider and make a decision soon.    Goals Addressed             This Visit's Progress    Patient Stated:  " I want to manage my arthritis pain and health conditions"       Interventions Today    Flowsheet Row Most Recent Value  Chronic Disease   Chronic disease during today's visit Other  [status post lumbar laminectomy, ongoing back/ buttock pain)]  General Interventions   General Interventions Discussed/Reviewed General Interventions Reviewed, Doctor Visits  [evaluation of current treatment plan for listed health conditions and patients adherence to plan as established by providers.  Assessed for ongoing symptoms.]  Doctor Visits Discussed/Reviewed Doctor Visits Reviewed  Annabell Sabal upcoming provider visits. Reviewed orthopedic surgery visit from 06/30/23]  Education Interventions   Education Provided Provided Education  [Advised to take medications for pain as recommended by provided and use ice/ heat to help with pain management. Advised to notify  provider for increase in pain or for any new symptoms.]  Pharmacy Interventions   Pharmacy Dicussed/Reviewed Pharmacy Topics Reviewed  [Reviewed medication and advised compliance. Advised that medications methocarbamol and gabapentin can cause drowsiness/dizziness and cause more risk for falls. Advised to stand up slowly from sitting or lying down. Use ambulatory device if recommended.]  Safety Interventions   Safety Discussed/Reviewed Fall Risk  [assessed for falls. Discussed fall prevention]              SDOH assessments and interventions completed:  No     Care Coordination Interventions:  Yes, provided   Follow up plan: Follow up call scheduled for 09/07/23 at 2 pm    Encounter Outcome:  Patient Visit Completed   George Ina RN, BSN, CCM [place]  [facility], Population Health Case Manager Phone: [phone]

## 2023-07-19 NOTE — Patient Instructions (Signed)
Visit Information  Thank you for taking time to visit with me today. Please don't hesitate to contact me if I can be of assistance to you.   Following are the goals we discussed today:   Goals Addressed             This Visit's Progress    Patient Stated:  " I want to manage my arthritis pain and health conditions"       Interventions Today    Flowsheet Row Most Recent Value  Chronic Disease   Chronic disease during today's visit Other  [status post lumbar laminectomy, ongoing back/ buttock pain)]  General Interventions   General Interventions Discussed/Reviewed General Interventions Reviewed, Doctor Visits  [evaluation of current treatment plan for listed health conditions and patients adherence to plan as established by providers.  Assessed for ongoing symptoms.]  Doctor Visits Discussed/Reviewed Doctor Visits Reviewed  Annabell Sabal upcoming provider visits. Reviewed orthopedic surgery visit from 06/30/23]  Education Interventions   Education Provided Provided Education  [Advised to take medications for pain as recommended by provided and use ice/ heat to help with pain management. Advised to notify provider for increase in pain or for any new symptoms.]  Pharmacy Interventions   Pharmacy Dicussed/Reviewed Pharmacy Topics Reviewed  [Reviewed medication and advised compliance. Advised that medications methocarbamol and gabapentin can cause drowsiness/dizziness and cause more risk for falls. Advised to stand up slowly from sitting or lying down. Use ambulatory device if recommended.]  Safety Interventions   Safety Discussed/Reviewed Fall Risk  [assessed for falls. Discussed fall prevention]              Our next appointment is by telephone on 09/07/23 at 2 pm  Please call the care guide team at [phone] if you need to cancel or reschedule your appointment.   If you are experiencing a Mental Health or Behavioral Health Crisis or need someone to talk to, please call the Suicide  and Crisis Lifeline: 988 call 1-800-273-TALK (toll free, 24 hour hotline)  Patient verbalizes understanding of instructions and care plan provided today and agrees to view in MyChart. Active MyChart status and patient understanding of how to access instructions and care plan via MyChart confirmed with patient.     George Ina RN, BSN, CCM [employer], Population Health Case Manager Phone: [phone]

## 2023-07-27 ENCOUNTER — Telehealth: Payer: Self-pay | Admitting: Neurosurgery

## 2023-07-27 NOTE — Telephone Encounter (Signed)
 Patient is calling to let our office know that she does not want to proceed with the SCS. She states that she would like to request for Dr. Mont Antis to send in Journavx to [facility] on [address]

## 2023-07-28 ENCOUNTER — Other Ambulatory Visit: Payer: Self-pay | Admitting: Neurosurgery

## 2023-07-28 ENCOUNTER — Other Ambulatory Visit: Payer: Self-pay | Admitting: Internal Medicine

## 2023-07-28 DIAGNOSIS — E119 Type 2 diabetes mellitus without complications: Secondary | ICD-10-CM | POA: Diagnosis not present

## 2023-07-28 DIAGNOSIS — T85848A Pain due to other internal prosthetic devices, implants and grafts, initial encounter: Secondary | ICD-10-CM | POA: Diagnosis not present

## 2023-07-28 DIAGNOSIS — F5101 Primary insomnia: Secondary | ICD-10-CM

## 2023-07-28 MED ORDER — TRAMADOL HCL 50 MG PO TABS: 50.0000 mg | ORAL_TABLET | Freq: Four times a day (QID) | ORAL | 0 refills | Status: AC | PRN

## 2023-07-28 NOTE — Telephone Encounter (Signed)
 Patient notified and she is asking if she could have a refill of the Tramadol . I did inform her we normally do not give pain medication 3 months after surgery. She is 5 months out. She states she has an appointment to establish care at [facility] on 2/11. Tramadol  was last filled on 11/26.

## 2023-07-28 NOTE — Telephone Encounter (Signed)
 Patient has been notified and verbalized understanding

## 2023-08-02 ENCOUNTER — Ambulatory Visit
Admission: RE | Admit: 2023-08-02 | Discharge: 2023-08-02 | Disposition: A | Discharge status: Home / Self Care | Payer: 59 | Source: Ambulatory Visit | Attending: Internal Medicine | Admitting: Internal Medicine

## 2023-08-02 DIAGNOSIS — K8020 Calculus of gallbladder without cholecystitis without obstruction: Secondary | ICD-10-CM | POA: Insufficient documentation

## 2023-08-02 DIAGNOSIS — R1011 Right upper quadrant pain: Secondary | ICD-10-CM | POA: Diagnosis not present

## 2023-08-02 DIAGNOSIS — K8689 Other specified diseases of pancreas: Secondary | ICD-10-CM | POA: Diagnosis not present

## 2023-08-02 DIAGNOSIS — R109 Unspecified abdominal pain: Secondary | ICD-10-CM | POA: Diagnosis not present

## 2023-08-02 DIAGNOSIS — N281 Cyst of kidney, acquired: Secondary | ICD-10-CM | POA: Diagnosis not present

## 2023-08-04 ENCOUNTER — Encounter: Payer: Self-pay | Admitting: Student in an Organized Health Care Education/Training Program

## 2023-08-04 ENCOUNTER — Ambulatory Visit
Payer: 59 | Attending: Student in an Organized Health Care Education/Training Program | Admitting: Student in an Organized Health Care Education/Training Program

## 2023-08-04 ENCOUNTER — Ambulatory Visit
Admission: RE | Admit: 2023-08-04 | Discharge: 2023-08-04 | Disposition: A | Discharge status: Home / Self Care | Payer: 59 | Source: Ambulatory Visit | Attending: Student in an Organized Health Care Education/Training Program | Admitting: Student in an Organized Health Care Education/Training Program

## 2023-08-04 VITALS — BP 161/79 | HR 70 | Temp 97.1°F | Ht 63.0 in | Wt 180.0 lb

## 2023-08-04 DIAGNOSIS — M25551 Pain in right hip: Secondary | ICD-10-CM | POA: Diagnosis not present

## 2023-08-04 DIAGNOSIS — G588 Other specified mononeuropathies: Secondary | ICD-10-CM

## 2023-08-04 DIAGNOSIS — G894 Chronic pain syndrome: Secondary | ICD-10-CM

## 2023-08-04 DIAGNOSIS — M961 Postlaminectomy syndrome, not elsewhere classified: Secondary | ICD-10-CM | POA: Diagnosis not present

## 2023-08-04 DIAGNOSIS — S32000A Wedge compression fracture of unspecified lumbar vertebra, initial encounter for closed fracture: Secondary | ICD-10-CM | POA: Insufficient documentation

## 2023-08-04 DIAGNOSIS — G8929 Other chronic pain: Secondary | ICD-10-CM | POA: Diagnosis not present

## 2023-08-04 DIAGNOSIS — S32000S Wedge compression fracture of unspecified lumbar vertebra, sequela: Secondary | ICD-10-CM | POA: Diagnosis not present

## 2023-08-04 DIAGNOSIS — M47816 Spondylosis without myelopathy or radiculopathy, lumbar region: Secondary | ICD-10-CM

## 2023-08-04 DIAGNOSIS — Z981 Arthrodesis status: Secondary | ICD-10-CM | POA: Diagnosis not present

## 2023-08-04 NOTE — Progress Notes (Signed)
Patient: [name]  Service Category: E/M  Provider: Edward Jolly, MD  DOB: 1945/10/04  DOS: 08/04/2023  Referring Provider: Venetia Night, MD  MRN: [id number]  Setting: Ambulatory outpatient  PCP: Margaretann Loveless, MD  Type: New Patient  Specialty: Interventional Pain Management    Location: Office  Delivery: Face-to-face     Primary Reason(s) for Visit: Encounter for initial evaluation of one or more chronic problems (new to examiner) potentially causing chronic pain, and posing a threat to normal musculoskeletal function. (Level of risk: High) CC: Back Pain (lower)  HPI  [name] is a 78 y.o. year old, female patient, who comes for the first time to our practice referred by Venetia Night, MD for our initial evaluation of her chronic pain. She has Coronary disease; Diabetes mellitus without complication (HCC); Gout; Osteoarthritis; Systemic lupus erythematosus (HCC); Rectal bleeding; Thickened endometrium; Status post reverse total shoulder replacement, right; Seasonal allergic rhinitis due to pollen; Essential hypertension, benign; Mixed hyperlipidemia; Rheumatoid arthritis involving multiple sites with positive rheumatoid factor (HCC); Tendinitis of left wrist; Acute cough; Post-COVID chronic cough; Breast cancer screening by mammogram; Allergy to mold; Shortness of breath; Type 2 diabetes mellitus with hyperglycemia, without long-term current use of insulin (HCC); History of lumbar fusion; Lumbar adjacent segment disease with spondylolisthesis; Hypertension associated with diabetes (HCC); Acute pain of right knee; Absolute anemia; Chronic kidney disease, stage IV (severe) (HCC); Failed back surgical syndrome; Cluneal neuropathy (RIGHT); Lumbar facet arthropathy; Compression of lumbar vertebra (HCC); Chronic pain syndrome; and Chronic hip pain, right on their problem list. Today she comes in for evaluation of her Back Pain (lower)  Pain Assessment: Location: Left, Right  Back Radiating: pain radiaties down both leg and right buttock at times Onset: More than a month ago Duration: Chronic pain Quality: Burning, Aching, Constant, Throbbing, Stabbing, Sharp Severity: 7 /10 (subjective, self-reported pain score)  Effect on ADL: limits my daily activities Timing: Constant Modifying factors: sitting and laying down, meds BP: (!) 161/79  HR: 70  Onset and Duration: Present longer than 3 months Cause of pain: Unknown Severity: Getting worse and NAS-11 on the average: 7/10 Timing: Not influenced by the time of the day Aggravating Factors: Walking, Walking uphill, and Walking downhill Alleviating Factors: Resting and Sleeping Associated Problems: Pain that does not allow patient to sleep Quality of Pain: Aching, Constant, and Stabbing Previous Examinations or Tests: CT scan and MRI scan Previous Treatments: Physical Therapy  Ms. Gadson is being evaluated for possible interventional pain management therapies for the treatment of her chronic pain.  Discussed the use of AI scribe software for clinical note transcription with the patient, who gave verbal consent to proceed.  History of Present Illness   [name] is a 78 year old female who presents with persistent back and buttock pain. She was referred by Dr. Myer Haff for consideration of a spinal cord stimulator.  She has been experiencing persistent pain in her lower back and buttock area since last year, following a lumbar fusion surgery performed in September of the previous year. The pain occasionally radiates to her legs but predominantly remains in the buttock region. She describes the pain as sometimes 'sticking' or sharp. Sitting exacerbates her back pain, leading her to prefer standing throughout the day. She is concerned about the appearance of her spine, noting scoliosis and arthritis, and reports a compression fracture and two pressing discs. Additionally, she mentions that one of the  screws from her previous surgery is loose.  She also experiences  right hip pain, which has not been previously evaluated with imaging studies such as x-rays or MRI. Her knees are in good condition, and she denies any significant pain on the left side, although she occasionally experiences mild symptoms there.  She is currently taking Plavix (clopidogrel) due to a stent placed in her heart in 2008. She has previously stopped this medication for surgical procedures, substituting it with aspirin as needed.  She has lived in [place] for about fifteen years, having previously resided in [place], [place], and [place]. She is originally [place]       Meds   Current Outpatient Medications:    allopurinol (ZYLOPRIM) 100 MG tablet, TAKE 1 TABLET BY MOUTH EVERY DAY, Disp: 90 tablet, Rfl: 3   clotrimazole-betamethasone (LOTRISONE) cream, APPLY TWICE A DAY TO GROIN AREA SKIN FOR 1 WEEK AND THEN AS NEEDED., Disp: 45 g, Rfl: 1   fluticasone (FLONASE) 50 MCG/ACT nasal spray, SPRAY 2 SPRAYS INTO EACH NOSTRIL EVERY DAY (Patient taking differently: Place 1 spray into both nostrils daily as needed.), Disp: 48 mL, Rfl: 3   furosemide (LASIX) 40 MG tablet, TAKE 1 TABLET BY MOUTH EVERY DAY, Disp: 90 tablet, Rfl: 25   gabapentin (NEURONTIN) 300 MG capsule, Take 1 capsule (300 mg total) by mouth at bedtime., Disp: 30 capsule, Rfl: 0   glimepiride (AMARYL) 2 MG tablet, Take 1 tablet (2 mg total) by mouth See admin instructions. Take 1 mg daily, may increase to 2 mg if blood sugar is over 175 (Patient taking differently: Take 2 mg by mouth daily with breakfast.), Disp: 180 tablet, Rfl: 3   hydrALAZINE (APRESOLINE) 100 MG tablet, TAKE 1 TABLET BY MOUTH TWICE A DAY (Patient taking differently: Take 100 mg by mouth 2 (two) times daily. Takes/1/2 tablet BID), Disp: 180 tablet, Rfl: 1   hydroxychloroquine (PLAQUENIL) 200 MG tablet, Take 200 mg by mouth daily., Disp: , Rfl:     labetalol (NORMODYNE) 300 MG tablet, TAKE 1 TABLET BY MOUTH TWICE A DAY, Disp: 180 tablet, Rfl: 3   losartan (COZAAR) 100 MG tablet, Take 1 tablet (100 mg total) by mouth every morning., Disp: 90 tablet, Rfl: 3   methocarbamol (ROBAXIN) 500 MG tablet, TAKE 1 TABLET BY MOUTH EVERY 6 HOURS AS NEEDED FOR MUSCLE SPASMS., Disp: 120 tablet, Rfl: 0   nitroGLYCERIN (NITROSTAT) 0.6 MG SL tablet, TAKE 1 TABLET BY MOUTH AS NEEDED FOR CP. WAIT 5 MINUTES BEFORE NEXT DOSE. PROCEED TO ER IF NO RELIEF AFTER 3 DOSES, Disp: 100 tablet, Rfl: 1   pantoprazole (PROTONIX) 40 MG tablet, TAKE 1 TABLET BY MOUTH EVERY DAY, Disp: 90 tablet, Rfl: 3   rosuvastatin (CRESTOR) 40 MG tablet, TAKE 1 TABLET BY MOUTH EVERY DAY, Disp: 90 tablet, Rfl: 0   senna (SENOKOT) 8.6 MG TABS tablet, Take 1 tablet (8.6 mg total) by mouth daily as needed for mild constipation., Disp: 30 tablet, Rfl: 0   sodium zirconium cyclosilicate (LOKELMA) 10 g PACK packet, Take 10 g by mouth as needed., Disp: , Rfl:    SYMBICORT 80-4.5 MCG/ACT inhaler, INHALE 1 PUFF BY MOUTH TWICE A DAY, Disp: 10.2 each, Rfl: 2   traMADol (ULTRAM) 50 MG tablet, Take 1 tablet (50 mg total) by mouth every 6 (six) hours as needed for moderate pain (pain score 4-6)., Disp: 30 tablet, Rfl: 0   tretinoin (RETIN-A) 0.025 % cream, TWICE A DAY, Disp: 45 g, Rfl: 6   zolpidem (AMBIEN) 5 MG tablet, TAKE  1 TABLET BY MOUTH EVERY DAY AT BEDTIME AS NEEDED FOR SLEEP, Disp: 30 tablet, Rfl: 0  Imaging Review  Cervical Imaging: Cervical MR wo contrast: Results for orders placed during the hospital encounter of 01/12/21  MR CERVICAL SPINE WO CONTRAST  Narrative CLINICAL DATA:  Chronic right shoulder pain M25.511, G89.29 (ICD-10-CM)  Neck pain M54.2 (ICD-10-CM)  EXAM: MRI CERVICAL SPINE WITHOUT CONTRAST  TECHNIQUE: Multiplanar, multisequence MR imaging of the cervical spine was performed. No intravenous contrast was administered.  COMPARISON:  CT August 02, 2012.  FINDINGS: Alignment: Straightening. Approximately 2 mm of anterolisthesis of T1 on T2. slight anterolisthesis of T2 on T3.  Vertebrae: C5-C7 ACDF with solid osseous fusion across the joint spaces. Degenerative/discogenic endplate signal changes at T1-T2.  Cord: Slight T2 hyperintensity within the cord at T1-T2.  Posterior Fossa, vertebral arteries, paraspinal tissues: Visualized vertebral artery flow voids are maintained. The visualized posterior fossa is unremarkable on limited sagittal assessment.  Disc levels:  C2-C3: Ligamentum flavum thickening with left greater than right facet and uncovertebral hypertrophy. Resulting moderate to severe left foraminal stenosis. Mild canal stenosis.  C3-C4: Bulky left-sided uncovertebral and facet hypertrophy with moderate to severe left foraminal stenosis. No significant canal or right foraminal stenosis.  C4-C5: Bulky left-sided facet and uncovertebral hypertrophy with moderate left foraminal stenosis. Mild canal stenosis. No significant right foraminal stenosis.  C5-C6: ACDF. Uncovertebral hypertrophy with mild-to-moderate left and mild right foraminal stenosis. No significant canal stenosis.  C6-C7: ACDF. Bilateral vertebral hypertrophy without significant canal or foraminal stenosis.  C7-T1: No significant canal or foraminal stenosis.  T1-T2: Severe degenerative disc disease with disc height loss and endplate irregularity. Approximately 2 mm of anterolisthesis of T1 on T2. Posterior disc osteophyte complex with ligamentum flavum thickening. Resulting moderate to severe canal stenosis with mass effect on the cord. Severe right and moderate left foraminal stenosis.  T2-T3: Slight anterolisthesis of T2 on T3. This level is only imaged sagittally with broad disc bulge and suspected mild canal stenosis.  IMPRESSION: 1. At T1-T2, moderate to severe canal stenosis, mass effect on the cord, and suspected faint cord edema at  this level. Severe right and moderate left foraminal stenosis. 2. Moderate to severe left foraminal stenosis at C2-C3 and C3-C4. Moderate left foraminal stenosis C4-C5. 3. C5-C7 ACDF without significant stenosis at these levels. 4. Mild canal stenosis at C2-C3 and T2-T3.  These results will be called to the ordering clinician or representative by the Radiologist Assistant, and communication documented in the PACS or [employer].   Electronically Signed By: Feliberto Harts MD On: 01/13/2021 10:56   Narrative MYELOGRAM CERVICAL AND LUMBAR  INTRATHECAL INJECTION OF CONTRAST FOR CERVICAL AND LUMBAR MYELOGRAM,CT CERVICAL SPINE WITH CONTRAST,CT LUMBAR SPINE WITH CONTRAST  Technique:  Radiographic contrast was carefully injected into the subarachnoid space under fluoroscopic guidance. The contrast was manipulated to opacify the subarachnoid space in the cervical and lumbar spine.,Technique:  Multidetector CT imaging of the  Comparison: Intraoperative view of the cervical spine 06/25/2005.  Findings: A single images submitted with the needle positioned at the L3-4 interspace to the right of midline. Images of the lumbar spine demonstrate severe stenosis at the L4-5 level.  The L5 and S1 nerve roots are bunched medially. The nerve roots above and below this level film normally.  Lateral view demonstrates grade 1/2 anterolisthesis of L4-5.  Alignment is otherwise anatomic.  Cervical images demonstrate the patient is status post C5-C7 anterior discectomy and fusion.  Nerve roots normally filled.  No focal  stenosis is seen. There appears to be lucency at the C6-7 level.  This may represent incomplete fusion.  IMPRESSION:  1.  Grade 1/2 anterolisthesis L4-5 with severe central canal stenosis. 2.  Status post ACDF C5-C7.  A persistent lucency at C6-7 is concerning for nonunion.  Clinical Data:  lumbar stenosis cervical pain.  CT MYELOGRAPHY CERVICAL SPINE  Technique:  Multidetector CT imaging of the cervical spine was performed following myelography.  Multiplanar CT image reconstructions were also generated.  Comparison: None.  Findings:  The cervical spine is imaged from C1 through T2-3.  As stated above, the patient is status post anterior cervical discectomy and fusion C5-C7.  There is persistent lucency between the inferior endplate of C6 and the bone graft material unable with nonunion.  Alignment is anatomic with the exception of minimal anterolisthesis at C2-3.  There is prominent soft tissue posterior to the dens without erosion.  The lung apices are clear.  C1-2:  Prominent osteophyte formation is seen along the medial aspect of the lateral masses.  This and dense of the thecal sac and narrows the right C1-2 foramen.  C2-3: Moderate left foraminal narrowing is due to uncovertebral and facet spurring.  The central canal and right neural foramen are patent.  C3-4: Severe left foraminal narrowing is due to advanced facet hypertrophy as well as some uncovertebral spurring.  This creates some effacement within the lateral aspect of central canal as well. The central canal is otherwise patent.  The right neural foramen is patent.  C4-5: Severe left foraminal narrowing is again due to advanced facet hypertrophy as well as left-sided uncovertebral spurring. The central canal and right neural foramen are patent.  C5-6: The patient is fused at this level.  Moderate left and mild right foraminal narrowing due to asymmetric facet hypertrophy and uncovertebral spurring.  There is some posterior osteophytic spurring is well which effaces the ventral CSF but does not result in any significant stenosis.  C6-7: As stated there is nonunion of the fusion at this level. Moderate to severe left foraminal narrowing is due to uncovertebral and facet spurring.  Mild right foraminal narrowing is due to same factors.  C7-T1: No significant stenosis is  present.  IMPRESSION:  1.  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 2.  Multilevel severe left foraminal narrowing from C3-4 through C5- 6 due to advanced facet hypertrophy and uncovertebral spurring. 3.  Right neural foraminal narrowing C1-2 due to facet hypertrophy.  Clinical Data:  lumbar stenosis cervical pain.  CT MYELOGRAPHY LUMBAR SPINE  Technique: Multidetector CT imaging of the lumbar spine was performed following myelography.  Multiplanar CT image reconstructions were also generated.  Comparison: None.  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  Grade 1 anterolisthesis of L4-5 measures 3-4 mm.  This is significantly less prominent than was seen on the plain film portion of the exam suggesting the level is mobile.  Alignment is otherwise anatomic.  8-0.7 cm exophytic cystic lesion is seen are near the upper pole of the right kidney.  This is incompletely characterized without IV contrast.  The soft tissues are otherwise unremarkable.  L1-2: Negative.  L2-3: Facet hypertrophy is present without significant stenosis.  L3-4: Mild facet hypertrophy is again present.  There is no significant stenosis.  L4-5: Severe central canal stenosis is due to a combination of anterolisthesis, disc bulging, and advanced facet arthropathy.  As stated, the level appears no  bowel.  Moderate to severe biforaminal narrowing is also present, right greater than left.  L5-S1: There is partial fusion of the right facets.  The left-sided posterior elements are fused across this level.  The central canal is patent.  Advanced facet arthropathy results and mild narrowing of the left foramen.  ]  IMPRESSION:  1.  Severe central canal stenosis L4-5 due to a combination of anterolisthesis, disc bulging and there is facet arthropathy. 2.  Right greater than left foraminal narrowing L4-5. 3.  Anterolisthesis of L4-5  is less prominent than on the plain film images.  This suggest the level of small bowel. 4.  Partial fusion of posterior elements of L5-S1 on the right and complete fusion on the left. 5.  Mild left foraminal narrowing of L5-S1.  Provider: Birdie Riddle, Dana Knoop   MR SHOULDER RIGHT WO CONTRAST  Narrative CLINICAL DATA:  Injured in a fall in April 2018 dislocated shoulder, Rt shoulder pain with painful and decreased ROM x 8 months  EXAM: MRI OF THE RIGHT SHOULDER WITHOUT CONTRAST  TECHNIQUE: Multiplanar, multisequence MR imaging of the shoulder was performed. No intravenous contrast was administered.  COMPARISON:  None.  FINDINGS: Rotator cuff: Moderate tendinosis of the supraspinatus tendon without a tear with irregularity along the bursal surface. Moderate tendinosis of the infraspinatus tendon without a tear. Teres minor tendon is intact. Mild tendinosis of the subscapularis tendon.  Muscles: No atrophy or fatty replacement of nor abnormal signal within, the muscles of the rotator cuff.  Biceps long head: Mild tendinosis of the intraarticular portion of the long head of the biceps tendon.  Acromioclavicular Joint: Mild arthropathy of the acromioclavicular joint. Type II acromion. Small amount of subacromial/ subdeltoid bursal fluid.  Glenohumeral Joint: Small joint effusion. High-grade partial-thickness cartilage loss with areas of full-thickness cartilage loss and subchondral reactive marrow changes.  Labrum: Superior labral degeneration. Tear of the superior posterior labrum. Attenuation of the anterior inferior labrum with abnormal signal most consistent with an anterior inferior labral tear.  Bones:  No acute osseous abnormality.  No aggressive osseous lesion.  Other: No fluid collection or hematoma.  IMPRESSION: 1. Moderate tendinosis of the supraspinatus tendon without a tear with irregularity along the bursal surface. 2. Moderate tendinosis of the  infraspinatus tendon without a tear. 3. Mild tendinosis of the subscapularis tendon. 4. Moderate -severe osteoarthritis of the right glenohumeral joint.   Electronically Signed By: Elige Ko On: 06/07/2017 13:08    Narrative CLINICAL DATA:  Right shoulder pain.  EXAM: CT OF THE UPPER RIGHT EXTREMITY WITHOUT CONTRAST  TECHNIQUE: Multidetector CT imaging of the upper right extremity was performed according to the standard protocol.  RADIATION DOSE REDUCTION: This exam was performed according to the departmental dose-optimization program which includes automated exposure control, adjustment of the mA and/or kV according to patient size and/or use of iterative reconstruction technique.  COMPARISON:  03/07/2021  FINDINGS: Bones/Joint/Cartilage  No fracture or dislocation. Normal alignment. No joint effusion.  Severe osteoarthritis of the right glenohumeral joint with inferior marginal osteophytes. Small subchondral cyst in the anterior inferior glenoid. 15 mm loose body along the axillary recess.  Mild arthropathy of the acromioclavicular joint.  Ligaments  Ligaments are suboptimally evaluated by CT.  Muscles and Tendons Muscles are normal.  No muscle atrophy.  Soft tissue No fluid collection or hematoma. No soft tissue mass. Visualized portions of the lung are clear.  IMPRESSION: 1. Severe osteoarthritis of the right glenohumeral joint.   Electronically Signed By: Alan Ripper  Patel M.D. On: 01/26/2022 07:34  CT Shoulder Left Wo Contrast  Narrative CLINICAL DATA:  Fall, left shoulder pain  EXAM: CT OF THE UPPER LEFT EXTREMITY WITHOUT CONTRAST  TECHNIQUE: Multidetector CT imaging of the upper left extremity was performed according to the standard protocol.  RADIATION DOSE REDUCTION: This exam was performed according to the departmental dose-optimization program which includes automated exposure control, adjustment of the mA and/or kV according  to patient size and/or use of iterative reconstruction technique.  COMPARISON:  Plain radiographs at 8:13 p.m.  FINDINGS: Bones/Joint/Cartilage  There is an acute, minimally displaced, aligned sagittally oriented fracture of the mid aspect of the coracoid process with 1 cortical with dorsal and inferior displacement and 2-3 mm distraction of the distal fracture fragment. Scapula is otherwise intact. Visualized humerus and visualized clavicle are intact. Acromioclavicular and glenohumeral joint spaces are preserved. Limited evaluation of the left thoracic cage is unremarkable  Ligaments  Suboptimally assessed by CT.  Muscles and Tendons  Short head biceps and pectoralis minor insertion upon the coracoid process appears intact. Otherwise unremarkable.  Soft tissues  There is mild infiltration along the fracture plane and extending along the axillary neurovascular bundle likely representing a small amount of edema/hemorrhage. No mass formed hematoma identified. Nodule within the left lower lobe is better appreciated on CT examination of 09/07/2021. Extensive coronary artery calcification noted.  IMPRESSION: Mildly displaced, anatomically aligned fracture of the mid body of the coracoid process.   Electronically Signed By: Helyn Numbers M.D. On: 10/04/2021 22:11    Narrative CLINICAL DATA:  Fall wasted.  Right shoulder pain.  EXAM: RIGHT SHOULDER - 2+ VIEW  COMPARISON:  None.  FINDINGS: There is anterior dislocation of the humerus relative to the glenoid. Possible small avulsed bone fragment inferior to the medial humeral head.  IMPRESSION: Anterior dislocation of the right shoulder with small avulsed fragment inferomedially.   Electronically Signed By: Charlett Nose M.D. On: 10/01/2016 15:03  Shoulder-L DG: Results for orders placed during the hospital encounter of 10/04/21  DG Shoulder Left  Narrative CLINICAL DATA:  Left shoulder pain after fall  in the shower.  EXAM: LEFT SHOULDER - 2+ VIEW  COMPARISON:  None.  FINDINGS: Acute avulsion fracture of the coracoid process. No additional fracture. No dislocation. Joint spaces are preserved. Osteopenia. Soft tissues are unremarkable.  IMPRESSION: 1. Acute avulsion fracture of the coracoid process.   Electronically Signed By: Obie Dredge M.D. On: 10/04/2021 20:47   Narrative CLINICAL DATA:  Closed compression fracture of T8.  EXAM: MRI THORACIC SPINE WITHOUT CONTRAST  TECHNIQUE: Multiplanar, multisequence MR imaging of the thoracic spine was performed. No intravenous contrast was administered.  COMPARISON:  Cervical and lumbar myelogram 09/13/2007  FINDINGS: Alignment:  Moderate T1-2 anterolisthesis, new from 2009.  Vertebrae: T7 compression fracture with mild height loss. Fracture is healed based on absence of marrow edema. Height loss is stable from chest x-ray November 2016. No acute fracture identified. Large partially fatty mass in the T11 vertebral body with trabecular thickening. This has the appearance of hemangioma, and is stable on 2009 lumbar myelogram. No aggressive bone lesion. Negative for discitis.  Cord: Degenerative distortion and T1-2. Otherwise normal morphology. No convincing signal abnormality.  Paraspinal and other soft tissues: Partly seen cholelithiasis, known from abdominal sonography 7 days prior. Right renal cysts. Very atrophic left kidney, partially visualized.  Disc levels:  T1-2: Advanced facet arthropathy with spurring and anterolisthesis. Bilateral foraminal stenosis and T1 impingement. The disc is also significantly degenerated with  collapse and bulging. Listhesis and ligamentum flavum thickening causes triangular deformation of the cord without definite signal abnormality.  Generalized facet arthropathy with spurring and ligamentum flavum thickening. These changes cause generalized foraminal  narrowing.  IMPRESSION: 1. The T7 compression fracture is remote and healed. Height loss is stable from 2016 chest x-ray. No acute fracture. 2. T1-2 advanced facet arthropathy with anterolisthesis and spinal stenosis that is new from 2009 cervical myelogram. The cord is deformed without signal abnormality.   Electronically Signed By: Marnee Spring M.D. On: 05/19/2016 08:55  Lumbosacral Imaging: Lumbar MR wo contrast: Results for orders placed during the hospital encounter of 06/09/23  MR LUMBAR SPINE WO CONTRAST  Narrative CLINICAL DATA:  Provided history: Lumbar adjacent segment disease with spondylolisthesis. Status post lumbar fusion. Low back pain, prior surgery, new symptoms. Additional history provided by the scanning technologist: the patient reports having lumbar spine surgery three months ago, persistent pain.  EXAM: MRI LUMBAR SPINE WITHOUT CONTRAST  TECHNIQUE: Multiplanar, multisequence MR imaging of the lumbar spine was performed. No intravenous contrast was administered.  COMPARISON:  Lumbar spine CT 06/09/2023. Lumbar spine radiographs 05/31/2023 and 04/20/2023. Lumbar spine MRI 11/25/2022.  FINDINGS: Segmentation: 5 lumbar vertebrae. The caudal most well-formed intervertebral disc space is designated L5-S1.  Alignment: 2 mm T12-L1 grade 1 retrolisthesis. Slight L5-S1 grade 1 anterolisthesis.  Vertebrae: Susceptibility artifact arising from a posterior spinal fusion construct spanning the L2-L5 levels, and from interbody devices at L3-L4 and L4-L5. No significant marrow edema. Large hemangioma again noted within the T11 vertebral body. L3 vertebral compression fracture (with up to 60% height loss), chronic but new from the prior lumbar spine MRI of 11/25/2022.  Conus medullaris and cauda equina: Conus extends to the L1-L2 level. No signal abnormality identified within the visualized distal spinal cord.  Paraspinal and other soft tissues: No acute  finding within included portions of the abdomen/retroperitoneum. Known pancreatic head lesion, renal cysts and left renal atrophy. Postsurgical changes to the paraspinal soft tissues.  Disc levels:  Unless otherwise stated, the level by level findings below have not significantly changed from the prior MRI of 11/25/2022.  Disc degeneration at the non-operative levels, greatest at L1-L2 (moderate-to-advanced at this level.  T10-T11: This level is imaged in the sagittal plane only. Slight disc bulge. Facet arthropathy and ligamentum flavum hypertrophy. No significant disc herniation or stenosis.  T11-T12: This level is imaged in the sagittal plane only. Slight disc bulge. Facet arthropathy. No significant spinal canal or foraminal stenosis.  T12-L1: Disc bulge. Mild facet arthropathy with ligamentum flavum hypertrophy. Small right facet joint effusion. A disc bulge mildly effaces the ventral thecal sac. No significant foraminal stenosis.  L1-L2: Disc bulge. Endplate osteophytes along the posterior and right aspects of the disc space. Facet and ligamentum flavum hypertrophy. Mild effacement of the ventral thecal sac. No significant foraminal stenosis.  L2-L3: New from the prior MRI, a posterior spinal fusion construct spans this level. Better appreciated on the same-day lumbar spine CT, there is lucency surrounding the left L2 pedicle screw and this screw extends slightly through the L2 superior endplate. Small disc bulge. Moderate facet arthropathy with ligamentum flavum hypertrophy. No significant spinal canal stenosis or neural foraminal narrowing.  L3-L4: Interval posterior decompression and posterior spinal fusion. Disc bulge. Posterior element and ligamentum flavum hypertrophy. Mild left subarticular narrowing, improved. Mild-to-moderate central canal stenosis, improved. Mild left neural foraminal narrowing, improved.  L4-L5: Prior post decompression and posterior  spinal fusion. Vertebral body and facet ankylosis. Endplate osteophytes mildly  efface the ventral thecal sac. Mild osseous neural foraminal narrowing bilaterally.  L5-S1: Chronic facet ankylosis bilaterally, as well as a degree of osseous fusion across the posterior aspect of the disc space. Posterior element hypertrophy results in mild left subarticular narrowing. No significant central canal stenosis or neural foraminal narrowing.  IMPRESSION: 1. Comparison is made to the prior lumbar spine MRI of 11/25/2022. 2. 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 3. At L2-L3, there has been interval posterior fusion. Better appreciated on the same day lumbar spine CT, there is lucency surrounding the left L2 pedicle screw compatible with loosening, and this screw extends slightly through the L2 superior endplate. 4. At L3-L4, there has been interval posterior decompression and posterior spinal fusion. Multifactorial mild-to-moderate central canal stenosis, improved. Mild left subarticular stenosis, improved. Mild left neural foraminal stenosis, improved. 5. No more than mild central canal/subarticular or neural foraminal stenosis at the remaining levels. 6. 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 7. L3 vertebral compression fracture (with up to 60% height loss), chronic but new from the prior MRI.   Electronically Signed By: Jackey Loge D.O. On: 06/27/2023 13:34    Narrative CLINICAL DATA:  Lumbar fusion.  EXAM: CT LUMBAR SPINE WITHOUT CONTRAST  TECHNIQUE: Multidetector CT imaging of the lumbar spine was performed without intravenous contrast administration. Multiplanar CT image reconstructions were also generated.  RADIATION DOSE REDUCTION: This exam was performed according to the departmental dose-optimization program which  includes automated exposure control, adjustment of the mA and/or kV according to patient size and/or use of iterative reconstruction technique.  COMPARISON:  Lumbar spine radiographs 05/31/2023, CT of the abdomen pelvis 03/13/2023. MRI of the lumbar spine without contrast 06/09/2023 and 11/25/2022.  FINDINGS: Segmentation: 5 non rib-bearing lumbar type vertebral bodies are present. The lowest fully formed vertebral body is L5.  Alignment: Grade 1 anterolisthesis at L3-4 is stable. Fused grade 1 anterolisthesis at L4-5 is stable. Lumbar lordosis is preserved. Progressive levoconvex curvature is present at L1-2.  Vertebrae: A superior endplate fracture at L3 has progressed since the CT scan of 03/13/2023. MRI of the same day demonstrates no significant edema at this level. Progressive endplate sclerotic changes are present on the left at L1-2. Increased lucency is noted about the left pedicle screw. The anterior aspect of the left pedicle screw no extends into the L1-2 disc space.  Paraspinal and other soft tissues: Atherosclerotic calcifications are present the aorta branch vessels. No aneurysm is present. Right renal cysts are stable. No follow-up imaging is recommended for the cysts.  Disc levels: T12-L1: That T11-12: Moderate facet hypertrophy is present bilaterally. Right foraminal narrowing is present.  T12-L1: Facet hypertrophy is present bilaterally. Mild disc bulging is noted. No significant stenosis is present.  L1-2: Asymmetric endplate changes are present on the right. Mild facet hypertrophy is noted bilaterally. Right foraminal narrowing is present.  L2-3: Disc spacer is in place. No bridging bone is present across the disc space no bridging bone is present across posterior elements. Central canal is patent. Foraminal narrowing is present bilaterally.  L4-5: Solid fusion is stable. Mild osseous foraminal narrowing is present bilaterally. The central canal is  widely patent.  L5-S1: Solid fusion is present. No residual or recurrent stenosis is present.  IMPRESSION: 1. Progressive superior endplate fracture at L3 since the CT scan of 03/13/2023. MRI of the same day demonstrates no significant edema at this  level. 2. No significant osseous fusion at L3-4. 3. Progressive endplate sclerotic changes on the left at L1-2 with increased lucency about the left pedicle screw. The anterior aspect of the left pedicle screw now extends into the L1-2 disc space. Findings are compatible with hardware loosening. 4. Solid fusion at L4-5 and L5-S1 without residual or recurrent stenosis. 5. Mild osseous foraminal narrowing bilaterally at L4-5. 6. Right foraminal narrowing at T11-12 and T12-L1. 7.  Aortic Atherosclerosis (ICD10-I70.0).   Electronically Signed By: Marin Roberts M.D. On: 06/28/2023 07:23  Narrative MYELOGRAM CERVICAL AND LUMBAR  INTRATHECAL INJECTION OF CONTRAST FOR CERVICAL AND LUMBAR MYELOGRAM,CT CERVICAL SPINE WITH CONTRAST,CT LUMBAR SPINE WITH CONTRAST  Technique:  Radiographic contrast was carefully injected into the subarachnoid space under fluoroscopic guidance. The contrast was manipulated to opacify the subarachnoid space in the cervical and lumbar spine.,Technique:  Multidetector CT imaging of the  Comparison: Intraoperative view of the cervical spine 06/25/2005.  Findings: A single images submitted with the needle positioned at the L3-4 interspace to the right of midline. Images of the lumbar spine demonstrate severe stenosis at the L4-5 level.  The L5 and S1 nerve roots are bunched medially. The nerve roots above and below this level film normally.  Lateral view demonstrates grade 1/2 anterolisthesis of L4-5.  Alignment is otherwise anatomic.  Cervical images demonstrate the patient is status post C5-C7 anterior discectomy and fusion.  Nerve roots normally filled.  No focal stenosis is seen. There appears to be  lucency at the C6-7 level.  This may represent incomplete fusion.  IMPRESSION:  1.  Grade 1/2 anterolisthesis L4-5 with severe central canal stenosis. 2.  Status post ACDF C5-C7.  A persistent lucency at C6-7 is concerning for nonunion.  Clinical Data:  lumbar stenosis cervical pain.  CT MYELOGRAPHY CERVICAL SPINE  Technique: Multidetector CT imaging of the cervical spine was performed following myelography.  Multiplanar CT image reconstructions were also generated.  Comparison: None.  Findings:  The cervical spine is imaged from C1 through T2-3.  As stated above, the patient is status post anterior cervical discectomy and fusion C5-C7.  There is persistent lucency between the inferior endplate of C6 and the bone graft material unable with nonunion.  Alignment is anatomic with the exception of minimal anterolisthesis at C2-3.  There is prominent soft tissue posterior to the dens without erosion.  The lung apices are clear.  C1-2:  Prominent osteophyte formation is seen along the medial aspect of the lateral masses.  This and dense of the thecal sac and narrows the right C1-2 foramen.  C2-3: Moderate left foraminal narrowing is due to uncovertebral and facet spurring.  The central canal and right neural foramen are patent.  C3-4: Severe left foraminal narrowing is due to advanced facet hypertrophy as well as some uncovertebral spurring.  This creates some effacement within the lateral aspect of central canal as well. The central canal is otherwise patent.  The right neural foramen is patent.  C4-5: Severe left foraminal narrowing is again due to advanced facet hypertrophy as well as left-sided uncovertebral spurring. The central canal and right neural foramen are patent.  C5-6: The patient is fused at this level.  Moderate left and mild right foraminal narrowing due to asymmetric facet hypertrophy and uncovertebral spurring.  There is some posterior  osteophytic spurring is well which effaces the ventral CSF but does not result in any significant stenosis.  C6-7: As stated there is nonunion of the fusion at this level. Moderate to severe  left foraminal narrowing is due to uncovertebral and facet spurring.  Mild right foraminal narrowing is due to same factors.  C7-T1: No significant stenosis is present.  IMPRESSION:  1.  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 2.  Multilevel severe left foraminal narrowing from C3-4 through C5- 6 due to advanced facet hypertrophy and uncovertebral spurring. 3.  Right neural foraminal narrowing C1-2 due to facet hypertrophy.  Clinical Data:  lumbar stenosis cervical pain.  CT MYELOGRAPHY LUMBAR SPINE  Technique: Multidetector CT imaging of the lumbar spine was performed following myelography.  Multiplanar CT image reconstructions were also generated.  Comparison: None.  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  Grade 1 anterolisthesis of L4-5 measures 3-4 mm.  This is significantly less prominent than was seen on the plain film portion of the exam suggesting the level is mobile.  Alignment is otherwise anatomic.  8-0.7 cm exophytic cystic lesion is seen are near the upper pole of the right kidney.  This is incompletely characterized without IV contrast.  The soft tissues are otherwise unremarkable.  L1-2: Negative.  L2-3: Facet hypertrophy is present without significant stenosis.  L3-4: Mild facet hypertrophy is again present.  There is no significant stenosis.  L4-5: Severe central canal stenosis is due to a combination of anterolisthesis, disc bulging, and advanced facet arthropathy.  As stated, the level appears no bowel.  Moderate to severe biforaminal narrowing is also present, right greater than left.  L5-S1: There is partial fusion of the right facets.  The left-sided posterior elements are fused  across this level.  The central canal is patent.  Advanced facet arthropathy results and mild narrowing of the left foramen.  ]  IMPRESSION:  1.  Severe central canal stenosis L4-5 due to a combination of anterolisthesis, disc bulging and there is facet arthropathy. 2.  Right greater than left foraminal narrowing L4-5. 3.  Anterolisthesis of L4-5 is less prominent than on the plain film images.  This suggest the level of small bowel. 4.  Partial fusion of posterior elements of L5-S1 on the right and complete fusion on the left. 5.  Mild left foraminal narrowing of L5-S1.  Provider: Birdie Riddle, Dana Knoop  Lumbar DG 1V: No results found for this or any previous visit.  Lumbar DG 1V (Clearing): No results found for this or any previous visit.  Lumbar DG 2-3V (Clearing): No results found for this or any previous visit.  Lumbar DG 2-3 views: Results for orders placed during the hospital encounter of 05/31/23  DG Lumbar Spine 2-3 Views  Narrative CLINICAL DATA:  Follow-up lumbar fusion.  EXAM: LUMBAR SPINE - 2-3 VIEW  COMPARISON:  04/20/2023 and older studies.  FINDINGS: No acute fracture.  Chronic loss of the vertebral height at L3.  Bilateral pedicle screws noted L2, L4 and L5. Single right-sided pedicle screw at L3. Intact interconnecting rods. Pedicle screws appear well seated and positioned and without change. Mental intervertebral cage is well centered at the L3-L4 level, also unchanged.  Mild residual grade 1 anterolisthesis the fused L3-L4 level, stable. No other spondylolisthesis. Curvature, convex the left, apex L1-L2.  Scattered arterial vascular calcifications. Soft tissues otherwise unremarkable.  IMPRESSION: 1. No acute findings. 2. Stable postoperative changes from the L2 through L5 posterior lumbar spine fusion. Stable compression deformity L3. No evidence of hardware failure.   Electronically Signed By: Amie Portland M.D. On: 06/08/2023  14:58 DG Wrist Complete  Left  Narrative CLINICAL DATA:  Left wrist pain after fall in the shower.  EXAM: LEFT WRIST - COMPLETE 3+ VIEW  COMPARISON:  Left thumb x-rays dated August 02, 2012.  FINDINGS: No acute fracture or dislocation. Similar moderate scaphotrapeziotrapezoid joint, mild radiocarpal joint, and mild first CMC joint osteoarthritis. Osteopenia. Progressive chondrocalcinosis of the TFCC. Soft tissues are unremarkable.  IMPRESSION: 1. No acute osseous abnormality.   Electronically Signed By: Obie Dredge M.D. On: 10/04/2021 20:49   Hand Imaging: Hand-R DG Complete: Results for orders placed during the hospital encounter of 01/07/23  DG Hand Complete Right  Narrative CLINICAL DATA:  Right hand pain and swelling for 1 month. No known injury.  EXAM: RIGHT HAND - COMPLETE 3+ VIEW  COMPARISON:  None Available.  FINDINGS: There is no evidence of fracture or dislocation. Degenerative changes are seen involving the second and third interphalangeal joints consistent with osteoarthritis. Soft tissues are unremarkable.  IMPRESSION: Osteoarthritis as noted above.  No acute abnormality seen.   Electronically Signed By: Lupita Raider M.D. On: 01/07/2023 13:49  Hand-L DG Complete: No results found for this or any previous visit.   Complexity Note: Imaging results reviewed.                         ROS  Cardiovascular: High blood pressure and Blood thinners:  Anticoagulant Pulmonary or Respiratory: No reported pulmonary signs or symptoms such as wheezing and difficulty taking a deep full breath (Asthma), difficulty blowing air out (Emphysema), coughing up mucus (Bronchitis), persistent dry cough, or temporary stoppage of breathing during sleep Neurological: No reported neurological signs or symptoms such as seizures, abnormal skin sensations, urinary and/or fecal incontinence, being born with an abnormal open spine and/or a tethered spinal  cord Psychological-Psychiatric: Difficulty sleeping and or falling asleep Gastrointestinal: No reported gastrointestinal signs or symptoms such as vomiting or evacuating blood, reflux, heartburn, alternating episodes of diarrhea and constipation, inflamed or scarred liver, or pancreas or irrregular and/or infrequent bowel movements Genitourinary: No reported renal or genitourinary signs or symptoms such as difficulty voiding or producing urine, peeing blood, non-functioning kidney, kidney stones, difficulty emptying the bladder, difficulty controlling the flow of urine, or chronic kidney disease Hematological: No reported hematological signs or symptoms such as prolonged bleeding, low or poor functioning platelets, bruising or bleeding easily, hereditary bleeding problems, low energy levels due to low hemoglobin or being anemic Endocrine: High blood sugar controlled without the use of insulin (NIDDM) Rheumatologic: Joint aches and or swelling due to excess weight (Osteoarthritis) Musculoskeletal: Negative for myasthenia gravis, muscular dystrophy, multiple sclerosis or malignant hyperthermia Work History: Working part time  Allergies  Ms. Mow is allergic to isosorbide dinitrate, amlodipine, sulfa antibiotics, tizanidine hcl, oxycodone, and sulfasalazine.  Laboratory Chemistry Profile   Renal Lab Results  Component Value Date   BUN 55 (H) 06/21/2023   CREATININE 2.47 (H) 06/21/2023   BCR 22 06/21/2023   GFRAA 27 (L) 09/11/2019   GFRNONAA 21 (L) 03/13/2023   SPECGRAV >1.030 (H) 11/03/2017   PHUR 5.0 11/03/2017   PROTEINUR 100 (A) 03/02/2023     Electrolytes Lab Results  Component Value Date   NA 147 (H) 06/21/2023   K 4.5 06/21/2023   CL 117 (HH) 06/21/2023   CALCIUM 9.3 06/21/2023   MG 1.9 05/31/2013     Hepatic Lab Results  Component Value Date   AST 13 06/21/2023   ALT 11 06/21/2023   ALBUMIN 3.9 06/21/2023   ALKPHOS  85 06/21/2023   LIPASE 35 03/13/2023      ID Lab Results  Component Value Date   SARSCOV2NAA negative 01/07/2023   STAPHAUREUS NEGATIVE 03/02/2023   MRSAPCR NEGATIVE 03/02/2023     Bone No results found for: "VD25OH", "VD125OH2TOT", "ZO1096EA5", "WU9811BJ4", "25OHVITD1", "25OHVITD2", "25OHVITD3", "TESTOFREE", "TESTOSTERONE"   Endocrine Lab Results  Component Value Date   GLUCOSE 72 06/21/2023   GLUCOSEU NEGATIVE 03/02/2023   HGBA1C 6.5 (H) 06/21/2023   TSH 2.00 05/31/2013     Neuropathy Lab Results  Component Value Date   VITAMINB12 908 10/17/2019   FOLATE 12.9 10/17/2019   HGBA1C 6.5 (H) 06/21/2023     CNS No results found for: "COLORCSF", "APPEARCSF", "RBCCOUNTCSF", "WBCCSF", "POLYSCSF", "LYMPHSCSF", "EOSCSF", "PROTEINCSF", "GLUCCSF", "JCVIRUS", "CSFOLI", "IGGCSF", "LABACHR", "ACETBL"   Inflammation (CRP: Acute  ESR: Chronic) No results found for: "CRP", "ESRSEDRATE", "LATICACIDVEN"   Rheumatology No results found for: "RF", "ANA", "LABURIC", "URICUR", "LYMEIGGIGMAB", "LYMEABIGMQN", "HLAB27"   Coagulation Lab Results  Component Value Date   INR 1.0 07/15/2021   LABPROT 13.0 07/15/2021   APTT 28 07/15/2021   PLT 173 06/21/2023     Cardiovascular Lab Results  Component Value Date   BNP 41.0 07/15/2021   CKTOTAL 349 (H) 05/06/2014   CKMB 3.3 05/06/2014   TROPONINI <0.03 08/20/2016   HGB 9.0 (L) 06/21/2023   HCT 29.1 (L) 06/21/2023     Screening Lab Results  Component Value Date   SARSCOV2NAA negative 01/07/2023   STAPHAUREUS NEGATIVE 03/02/2023   MRSAPCR NEGATIVE 03/02/2023     Cancer No results found for: "CEA", "CA125", "LABCA2"   Allergens No results found for: "ALMOND", "APPLE", "ASPARAGUS", "AVOCADO", "BANANA", "BARLEY", "BASIL", "BAYLEAF", "GREENBEAN", "LIMABEAN", "WHITEBEAN", "BEEFIGE", "REDBEET", "BLUEBERRY", "BROCCOLI", "CABBAGE", "MELON", "CARROT", "CASEIN", "CASHEWNUT", "CAULIFLOWER", "CELERY"     Note: Lab results reviewed.  PFSH  Drug: Ms. Hoefer  reports no history of drug  use. Alcohol:  reports current alcohol use. Tobacco:  reports that she has never smoked. She has never used smokeless tobacco. Medical:  has a past medical history of Anemia of chronic renal disease, Arthritis, Cerebral aneurysm (06/07/2006), CKD (chronic kidney disease), stage IV (HCC), Complication of anesthesia, Coronary artery disease (02/23/2007), DDD (degenerative disc disease), cervical, Diastolic dysfunction, Elevated LEFT hemidiaphragm, Gout, Headache(784.0), Heart murmur, HLD (hyperlipidemia), HOH (hard of hearing), Hyperkalemia, Hyperparathyroidism due to renal insufficiency (HCC), Hypertension, Incomplete right bundle branch block (RBBB), Insomnia, Long term (current) use of immunomodulator, Lumbar adjacent segment disease with spondylolisthesis, Multiple acquired cysts of kidney, On chronic clopidogrel therapy, OSA (obstructive sleep apnea), Osteoarthritis, Pancreatic cyst, Peripheral vascular disease (HCC), Pneumonia, Post-COVID chronic cough, Pulmonary nodule, RBBB (right bundle branch block), Rectal bleeding, Rheumatoid arthritis (HCC), Shortness of breath (03/01/2023), Systemic lupus erythematosus (HCC), T2DM (type 2 diabetes mellitus) (HCC), Tendinitis of left wrist, Thickened endometrium, and Wears dentures. Family: family history includes Breast cancer in her paternal aunt; Breast cancer (age of onset: 43) in her paternal aunt; Diabetes in her brother and sister; Heart disease in her father and mother; Stroke in her father.  Past Surgical History:  Procedure Laterality Date   ANEURYSM COILING  08/2013   ANTERIOR LATERAL LUMBAR FUSION WITH PERCUTANEOUS SCREW 1 LEVEL N/A 03/09/2023   Procedure: L3-4 LATERAL LUMBAR INTERBODY FUSION;  Surgeon: Venetia Night, MD;  Location: [facility];  Service: Neurosurgery;  Laterality: N/A;   APPLICATION OF INTRAOPERATIVE CT SCAN N/A 03/09/2023   Procedure: APPLICATION OF INTRAOPERATIVE CT SCAN;  Surgeon: Venetia Night, MD;  Location: [facility];   Service: Neurosurgery;  Laterality: N/A;  Attempted embolization of previously treated ACOM aneurysm; unsuccessful N/A 01/28/2012   Location: [facility]   BACK SURGERY     BARTHOLIN CYST MARSUPIALIZATION     BICEPT TENODESIS Right 03/02/2022   Procedure: BICEPS TENODESIS;  Surgeon: Christena Flake, MD;  Location: [facility];  Service: Orthopedics;  Laterality: Right;   BREAST CYST ASPIRATION Right 01/25/2012   FNA neg.   BREAST EXCISIONAL BIOPSY Left 06/26/2007   neg   BUNIONECTOMY Bilateral 2022   CARDIAC CATHETERIZATION Left 02/23/2007   Procedure: CARDIAC CATHETERIZATION; Location: [facility]; Surgeon: Adrian Blackwater, MD   CARDIAC CATHETERIZATION Left 07/31/2007   Procedure: CARDIAC CATHETERIZATION; Location: [facility]; Surgeon: Adrian Blackwater, MD   CARDIAC CATHETERIZATION Left 10/28/2010   Procedure: CARDIAC CATHETERIZATION; Location: [facility]; Surgeon: Adrian Blackwater, MD   CARDIAC CATHETERIZATION Left 05/31/2013   Procedure: CARDIAC CATHETERIZATION; Location: [facility]; Surgeon: Adrian Blackwater, MD   Carotid arteriogram; 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 N/A 07/18/2006   Location: [facility]   CATARACT EXTRACTION     CERVICAL FUSION N/A    Procedure: ACDF C5-C7   COLONOSCOPY WITH PROPOFOL N/A 10/04/2019   Procedure: COLONOSCOPY WITH BIOPSY;  Surgeon: Toney Reil, MD;  Location: [facility];  Service: Endoscopy;  Laterality: N/A;  Diabetic (borderline) - oral meds priority 3   CORONARY STENT INTERVENTION Left 02/27/2007   Procedure: CORONARY STENT INTERVENTION (3.5 x 15 mm Vision BMS to mRCA); Location: [facility]; Surgeon: Lorine Bears, MD   Endovascular near complete obliteration of enlarging neck remnant of previously treated RIGHT MCA aneurysm using stent assisted coiling N/A 09/03/2010   Location: [facility]   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;  Service: Endoscopy;  Laterality: N/A;   REVERSE SHOULDER ARTHROPLASTY Right 03/02/2022   Procedure: REVERSE SHOULDER ARTHROPLASTY;  Surgeon: Christena Flake, MD;  Location: [facility] ORS;  Service: Orthopedics;  Laterality: Right;   UPPER ESOPHAGEAL ENDOSCOPIC ULTRASOUND (EUS) N/A 04/15/2016   Procedure: UPPER ESOPHAGEAL ENDOSCOPIC ULTRASOUND (EUS);  Surgeon: Bearl Mulberry, MD;  Location: [facility] ENDOSCOPY;  Service: Gastroenterology;  Laterality: N/A;   Active Ambulatory Problems    Diagnosis Date Noted   Coronary disease 11/03/2017   Diabetes mellitus without complication (HCC) 11/03/2017   Gout 11/03/2017   Osteoarthritis 11/03/2017   Systemic lupus erythematosus (HCC) 09/19/2019   Rectal bleeding    Thickened endometrium    Status post reverse total shoulder replacement, 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   Seasonal allergic rhinitis due to pollen 08/26/2022   Essential hypertension, benign 08/26/2022   Mixed hyperlipidemia 08/26/2022   Rheumatoid arthritis involving multiple sites with positive rheumatoid factor (HCC) 08/26/2022   Tendinitis of left wrist 08/26/2022   Acute cough 12/02/2022   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   Breast cancer screening by mammogram 12/16/2022   Allergy to mold 03/01/2023   Shortness of breath 03/01/2023   Type 2 diabetes mellitus with hyperglycemia, without long-term current use of insulin (HCC) 03/01/2023   History of lumbar fusion 03/09/2023   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   Hypertension associated with diabetes (HCC) 05/31/2023   Acute pain of right knee 05/31/2023  Absolute anemia 05/31/2023   Chronic kidney  disease, stage IV (severe) (HCC) 06/24/2023   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0)    Heart murmur    HLD (hyperlipidemia)    HOH (hard of hearing)    Hyperkalemia    Hyperparathyroidism due to renal insufficiency (HCC)    Hypertension    Incomplete right bundle branch block (RBBB)    Insomnia    Long term (current) use of immunomodulator    Multiple acquired cysts of kidney    On chronic clopidogrel therapy    OSA (obstructive sleep apnea)    Pancreatic cyst    Peripheral vascular disease (HCC)    Pneumonia    Pulmonary nodule    RBBB (right bundle branch block)    Rheumatoid arthritis (HCC)    T2DM (type 2 diabetes mellitus) (HCC)    Wears dentures    Constitutional Exam  General appearance: Well nourished, well developed, and well hydrated. In no apparent acute distress Vitals:   08/04/23 1313  BP: (!) 161/79  Pulse: 70  Temp: (!) 97.1 F (36.2 C)  SpO2: 96%  Weight: 180 lb (81.6 kg)  Height: 5\' 3"  (1.6 m)   BMI Assessment: Estimated body mass index is 31.89 kg/m as calculated from the following:   Height as of this encounter: 5\' 3"  (1.6 m).   Weight as of this encounter: 180 lb (81.6 kg).  BMI interpretation table: BMI level Category Range association with higher incidence of chronic pain  <18 kg/m2 Underweight    18.5-24.9 kg/m2 Ideal body weight   25-29.9 kg/m2 Overweight Increased incidence by 20%  30-34.9 kg/m2 Obese (Class I) Increased incidence by 68%  35-39.9 kg/m2 Severe obesity (Class II) Increased incidence by 136%  >40 kg/m2 Extreme obesity (Class III) Increased incidence by 254%   Patient's current BMI Ideal Body weight  Body mass index is 31.89 kg/m. Ideal body weight: 52.4 kg (115 lb 8.3 oz) Adjusted ideal body weight: 64.1 kg (141 lb 5 oz)   BMI Readings from Last 4 Encounters:  08/04/23 31.89 kg/m  06/30/23 31.96 kg/m  06/24/23 31.89 kg/m  05/31/23 31.35 kg/m   Wt Readings from Last 4 Encounters:  08/04/23 180 lb (81.6 kg)  06/30/23 180 lb 6.4 oz (81.8 kg)  06/24/23 180 lb (81.6 kg)  05/31/23 177 lb (80.3 kg)    Psych/Mental status: Alert, oriented x 3 (person, place, & time)       Eyes: PERLA Respiratory: No evidence of acute respiratory distress  Cervical Spine Area Exam  Skin & Axial Inspection: No masses, redness, edema, swelling, or associated skin lesions Alignment: Symmetrical Functional ROM: Pain restricted ROM      Stability: No instability detected Muscle Tone/Strength: Functionally intact. No obvious neuro-muscular anomalies detected. Sensory (Neurological): Neurogenic pain pattern Palpation: No palpable anomalies  Upper Extremity (UE) Exam    Side: Right upper extremity  Side: Left upper extremity  Skin & Extremity Inspection: Skin color, temperature, and hair growth are WNL. No peripheral edema or cyanosis. No masses, redness, swelling, asymmetry, or associated skin lesions. No contractures.  Skin & Extremity Inspection: Skin color, temperature, and hair growth are WNL. No peripheral edema or cyanosis. No masses, redness, swelling, asymmetry, or associated skin lesions. No contractures.  Functional ROM: Unrestricted ROM          Functional ROM: Unrestricted ROM          Muscle Tone/Strength: Functionally intact. No obvious neuro-muscular  anomalies detected.  Muscle Tone/Strength: Functionally intact. No obvious neuro-muscular anomalies detected.  Sensory (Neurological): Musculoskeletal pain pattern          Sensory (Neurological): Musculoskeletal pain pattern          Palpation: No palpable anomalies              Palpation: No palpable anomalies              Provocative Test(s):  Phalen's test: deferred Tinel's test: deferred Apley's scratch test (touch opposite shoulder):  Action 1 (Across chest): Decreased ROM Action 2 (Overhead): Decreased ROM Action 3 (LB reach): Decreased ROM   Provocative Test(s):  Phalen's test: deferred Tinel's test: deferred Apley's scratch test (touch opposite shoulder):  Action 1 (Across chest): Decreased ROM Action 2 (Overhead): Decreased ROM Action 3 (LB reach): Decreased ROM    Thoracic Spine Area Exam  Skin & Axial Inspection: Well healed scar from previous spine surgery detected Alignment: Asymmetric Functional ROM: Diminished ROM Stability: No instability detected Muscle Tone/Strength: Functionally intact. No obvious neuro-muscular anomalies detected. Sensory (Neurological): Neurogenic pain pattern Muscle strength & Tone: No palpable anomalies Lumbar Spine Area Exam  Skin & Axial Inspection: Well healed scar from previous spine surgery detected Alignment: Scoliosis detected Functional ROM: Pain restricted ROM affecting both sides Stability: No instability detected Muscle Tone/Strength: Functionally intact. No obvious neuro-muscular anomalies detected. Sensory (Neurological): Dermatomal pain pattern & neurogenic Palpation: Complains of area being tender to palpation        Gait & Posture Assessment  Ambulation: Patient ambulates using a cane Gait: Limited. Using assistive device to ambulate Posture: Difficulty standing up straight, due to pain  Lower Extremity Exam    Side: Right lower extremity  Side: Left lower extremity  Stability: No instability observed           Stability: No instability observed          Skin & Extremity Inspection: Skin color, temperature, and hair growth are WNL. No peripheral edema or cyanosis. No masses, redness, swelling, asymmetry, or associated skin lesions. No contractures.  Skin & Extremity Inspection: Skin color, temperature, and hair growth are WNL. No peripheral edema or cyanosis. No masses, redness, swelling, asymmetry, or associated skin lesions. No contractures.  Functional ROM: Pain restricted ROM for hip joint          Functional ROM: Unrestricted ROM                  Muscle Tone/Strength: Functionally intact. No obvious neuro-muscular anomalies detected.  Muscle Tone/Strength: Functionally intact. No obvious neuro-muscular anomalies detected.  Sensory (Neurological): Musculoskeletal pain pattern        Sensory (Neurological): Unimpaired        DTR: Patellar: deferred today Achilles: deferred today Plantar: deferred today  DTR: Patellar: deferred today Achilles: deferred today Plantar: deferred today  Palpation: No palpable anomalies  Palpation: No palpable anomalies    Assessment  Primary Diagnosis & Pertinent Problem List: The primary encounter diagnosis was Cluneal neuropathy (RIGHT). Diagnoses of Chronic hip pain, right, Failed back surgical syndrome, History of lumbar fusion, Lumbar facet arthropathy, Compression fracture of lumbar vertebra, unspecified lumbar vertebral level, sequela, and Chronic pain syndrome were also pertinent to this visit.  Visit Diagnosis (New problems to examiner): 1. Cluneal neuropathy (RIGHT)   2. Chronic hip pain, right   3. Failed back surgical syndrome   4. History of lumbar fusion   5. Lumbar facet arthropathy   6. Compression fracture of lumbar vertebra, unspecified lumbar vertebral level, sequela   7. Chronic pain syndrome    Plan of Care    Problem-specific plan: Assessment and Plan    Chronic Lower Back Pain Post-Lumbar Fusion Chronic lower back pain persists since  lumbar fusion surgery in September, performed by Dr. Myer Haff. The pain is localized to the lower back and buttock, occasionally radiating to the legs. There is notable scoliosis, arthritis, and a compression fracture. Concerns about further surgery and spine appearance led to declining a spinal cord stimulator.  Patient states that she is not interested in having anything implanted in her if she can avoid it.  Less invasive options like nerve blocks were discussed. Plan to schedule a cluneal nerve block targeting nerves over the right iliac crest as that is where the majority of her pain is. Stop Plavix 7 days prior to the procedure and switch to baby aspirin. Administer a local anesthetic during the nerve block.  Right Hip Pain Right hip pain may be due to cluneal neuropathy or hip pathology, with no prior hip imaging.Order right hip x-rays and consider CT or MRI based on x-ray results.  Right SIJ dysfunction: Differential for right hip and right buttock pain also includes right SI joint dysfunction.  She was unable to perform provocative maneuvers today.  Could consider a diagnostic right SI joint and piriformis injection in the future.  Coronary Artery Disease with Stent Placement Coronary artery disease with stent placement occurred in 2008. Currently on Plavix (clopidogrel). Stop Plavix 7 days prior to the nerve block procedure and switch to baby aspirin.  Follow-up Follow up with the clinic for pain management needs.       Lab Orders         Compliance Drug Analysis, Ur     Imaging Orders         DG PAIN CLINIC C-ARM 1-60 MIN NO REPORT         DG HIP UNILAT W OR W/O PELVIS 2-3 VIEWS RIGHT      Procedure Orders         CLUNEAL NERVE BLOCK     Provider-requested follow-up: Return in about 20 days (around 08/24/2023) for right cluneal nb.  Future Appointments  Date Time Provider Department Center  08/05/2023  1:00 PM Margaretann Loveless, MD [facility] None  08/24/2023  1:20 PM Edward Jolly,  MD [facility] None  09/07/2023  2:00 PM Otho Ket, RN [facility] None    Duration of encounter: .  Total time on encounter, as per AMA guidelines included both the face-to-face and non-face-to-face time personally spent by the physician and/or other qualified health care professional(s) on the day of the encounter (includes time in activities that require the physician or other qualified health care professional and does not include time in activities normally performed by clinical staff). Physician's time may include the following activities when performed: Preparing  to see the patient (e.g., pre-charting review of records, searching for previously ordered imaging, lab work, and nerve conduction tests) Review of prior analgesic pharmacotherapies. Reviewing PMP Interpreting ordered tests (e.g., lab work, imaging, nerve conduction tests) Performing post-procedure evaluations, including interpretation of diagnostic procedures Obtaining and/or reviewing separately obtained history Performing a medically appropriate examination and/or evaluation Counseling and educating the patient/family/caregiver Ordering medications, tests, or procedures Referring and communicating with other health care professionals (when not separately reported) Documenting clinical information in the electronic or other health record Independently interpreting results (not separately reported) and communicating results to the patient/ family/caregiver Care coordination (not separately reported)  Note by: Edward Jolly, MD (AI and TTS technology used. I apologize for any typographical errors that were not detected and corrected.) Date: 08/04/2023; Time: 2:50 PM

## 2023-08-04 NOTE — Progress Notes (Signed)
Safety precautions to be maintained throughout the outpatient stay will include: orient to surroundings, keep bed in low position, maintain call bell within reach at all times, provide assistance with transfer out of bed and ambulation.

## 2023-08-04 NOTE — Patient Instructions (Addendum)
Stop Plavix 7 days prior to procedure, take ASA 81 mg instead ______________________________________________________________________    Preparing for your procedure  Appointments: If you think you may not be able to keep your appointment, call 24-48 hours in advance to cancel. We need time to make it available to others.  Procedure visits are for procedures only. During your procedure appointment there will be: NO Prescription Refills*. NO medication changes or discussions*. NO discussion of disability issues*. NO unrelated pain problem evaluations*. NO evaluations to order other pain procedures*. *These will be addressed at a separate and distinct evaluation encounter on the provider's evaluation schedule and not during procedure days.  Instructions: Food intake: Avoid eating anything solid for at least 8 hours prior to your procedure. Clear liquid intake: You may take clear liquids such as water up to 2 hours prior to your procedure. (No carbonated drinks. No soda.) Transportation: Unless otherwise stated by your physician, bring a driver. (Driver cannot be a Market researcher, Pharmacist, community, or any other form of public transportation.) Morning Medicines: Except for blood thinners, take all of your other morning medications with a sip of water. Make sure to take your heart and blood pressure medicines. If your blood pressure's lower number is above 100, the case will be rescheduled. Blood thinners: Make sure to stop your blood thinners as instructed.  If you take a blood thinner, but were not instructed to stop it, call our office [phone] and ask to talk to a nurse. Not stopping a blood thinner prior to certain procedures could lead to serious complications. Diabetics on insulin: Notify the staff so that you can be scheduled 1st case in the morning. If your diabetes requires high dose insulin, take only  of your normal insulin dose the morning of the procedure and notify the staff that you have done  so. Preventing infections: Shower with an antibacterial soap the morning of your procedure.  Build-up your immune system: Take 1000 mg of Vitamin C with every meal (3 times a day) the day prior to your procedure. Antibiotics: Inform the nursing staff if you are taking any antibiotics or if you have any conditions that may require antibiotics prior to procedures. (Example: recent joint implants)   Pregnancy: If you are pregnant make sure to notify the nursing staff. Not doing so may result in injury to the fetus, including death.  Sickness: If you have a cold, fever, or any active infections, call and cancel or reschedule your procedure. Receiving steroids while having an infection may result in complications. Arrival: You must be in the facility at least 30 minutes prior to your scheduled procedure. Tardiness: Your scheduled time is also the cutoff time. If you do not arrive at least 15 minutes prior to your procedure, you will be rescheduled.  Children: Do not bring any children with you. Make arrangements to keep them home. Dress appropriately: There is always a possibility that your clothing may get soiled. Avoid long dresses. Valuables: Do not bring any jewelry or valuables.  Reasons to call and reschedule or cancel your procedure: (Following these recommendations will minimize the risk of a serious complication.) Surgeries: Avoid having procedures within 2 weeks of any surgery. (Avoid for 2 weeks before or after any surgery). Flu Shots: Avoid having procedures within 2 weeks of a flu shots or . (Avoid for 2 weeks before or after immunizations). Barium: Avoid having a procedure within 7-10 days after having had a radiological study involving the use of radiological contrast. (Myelograms, Barium swallow or  enema study). Heart attacks: Avoid any elective procedures or surgeries for the initial 6 months after a "Myocardial Infarction" (Heart Attack). Blood thinners: It is imperative that you stop  these medications before procedures. Let us know if you if you take any blood thinner.  Infection: Avoid procedures during or within two weeks of an infection (including chest colds or gastrointestinal problems). Symptoms associated with infections include: Localized redness, fever, chills, night sweats or profuse sweating, burning sensation when voiding, cough, congestion, stuffiness, runny nose, sore throat, diarrhea, nausea, vomiting, cold or Flu symptoms, recent or current infections. It is specially important if the infection is over the area that we intend to treat. Heart and lung problems: Symptoms that may suggest an active cardiopulmonary problem include: cough, chest pain, breathing difficulties or shortness of breath, dizziness, ankle swelling, uncontrolled high or unusually low blood pressure, and/or palpitations. If you are experiencing any of these symptoms, cancel your procedure and contact your primary care physician for an evaluation.  Remember:  Regular Business hours are:  Monday to Thursday 8:00 AM to 4:00 PM  Provider's Schedule: Delano Metz, MD:  Procedure days: Tuesday and Thursday 7:30 AM to 4:00 PM  Edward Jolly, MD:  Procedure days: Monday and Wednesday 7:30 AM to 4:00 PM Last  Updated: 05/31/2023 ______________________________________________________________________

## 2023-08-05 ENCOUNTER — Ambulatory Visit
Admission: RE | Admit: 2023-08-05 | Discharge: 2023-08-05 | Disposition: A | Discharge status: Home / Self Care | Payer: 59 | Source: Ambulatory Visit | Attending: Student in an Organized Health Care Education/Training Program | Admitting: Student in an Organized Health Care Education/Training Program

## 2023-08-05 ENCOUNTER — Ambulatory Visit (INDEPENDENT_AMBULATORY_CARE_PROVIDER_SITE_OTHER): Payer: 59 | Admitting: Internal Medicine

## 2023-08-05 ENCOUNTER — Encounter: Payer: Self-pay | Admitting: Internal Medicine

## 2023-08-05 VITALS — BP 146/96 | HR 64 | Ht 63.0 in | Wt 180.4 lb

## 2023-08-05 DIAGNOSIS — I152 Hypertension secondary to endocrine disorders: Secondary | ICD-10-CM

## 2023-08-05 DIAGNOSIS — M0579 Rheumatoid arthritis with rheumatoid factor of multiple sites without organ or systems involvement: Secondary | ICD-10-CM

## 2023-08-05 DIAGNOSIS — N184 Chronic kidney disease, stage 4 (severe): Secondary | ICD-10-CM

## 2023-08-05 DIAGNOSIS — G8929 Other chronic pain: Secondary | ICD-10-CM

## 2023-08-05 DIAGNOSIS — Z1389 Encounter for screening for other disorder: Secondary | ICD-10-CM

## 2023-08-05 DIAGNOSIS — E782 Mixed hyperlipidemia: Secondary | ICD-10-CM | POA: Diagnosis not present

## 2023-08-05 DIAGNOSIS — E1165 Type 2 diabetes mellitus with hyperglycemia: Secondary | ICD-10-CM

## 2023-08-05 DIAGNOSIS — E1169 Type 2 diabetes mellitus with other specified complication: Secondary | ICD-10-CM

## 2023-08-05 DIAGNOSIS — R1011 Right upper quadrant pain: Secondary | ICD-10-CM

## 2023-08-05 DIAGNOSIS — N390 Urinary tract infection, site not specified: Secondary | ICD-10-CM

## 2023-08-05 DIAGNOSIS — M160 Bilateral primary osteoarthritis of hip: Secondary | ICD-10-CM | POA: Diagnosis not present

## 2023-08-05 DIAGNOSIS — K8020 Calculus of gallbladder without cholecystitis without obstruction: Secondary | ICD-10-CM

## 2023-08-05 DIAGNOSIS — M25551 Pain in right hip: Secondary | ICD-10-CM | POA: Insufficient documentation

## 2023-08-05 DIAGNOSIS — E1159 Type 2 diabetes mellitus with other circulatory complications: Secondary | ICD-10-CM

## 2023-08-05 LAB — POCT URINALYSIS DIPSTICK
Bilirubin, UA: NEGATIVE
Blood, UA: NEGATIVE
Glucose, UA: NEGATIVE
Ketones, UA: NEGATIVE
Nitrite, UA: POSITIVE
Protein, UA: POSITIVE — AB
Spec Grav, UA: 1.025 (ref 1.010–1.025)
Urobilinogen, UA: 0.2 U/dL
pH, UA: 5.5 (ref 5.0–8.0)

## 2023-08-05 LAB — POCT CBG (FASTING - GLUCOSE)-MANUAL ENTRY: Glucose Fasting, POC: 171 mg/dL — AB (ref 70–99)

## 2023-08-05 MED ORDER — AMOXICILLIN-POT CLAVULANATE 500-125 MG PO TABS
1.0000 | ORAL_TABLET | Freq: Two times a day (BID) | ORAL | 0 refills | Status: DC
Start: 2023-08-05 — End: 2023-10-11

## 2023-08-05 NOTE — Progress Notes (Signed)
Established Patient Office Visit  Subjective:  Patient ID: [name], female    DOB: [birth year]  Age: 78 y.o. MRN: [id number]  Chief Complaint  Patient presents with   Follow-up    6 week follow up    Patient comes in for follow-up of her right upper quadrant pain.  She had an abdominal ultrasound done which showed gallbladder with multiple gallstones and a possible adenomyomatosis.  Will get a surgical consultation and recommendation.  She has a stable mass in the head of the pancreas which is being monitored over the last few years.   Mentions foul-smelling urine, urine dipstick shows pus cells.  Will send for culture and empirically start Augmentin.  She also needs BUN and creatinine to be checked today.    No other concerns at this time.   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 b.)07/18/2006 -endovas oblit complex ACOM aneur. c.) Endovas Tx of 6.8x40mm RMCA aneur. d.) 3.5x17mm remnant RMCA aneur 2/2 coil compaction. e.) Interval 3.2x3.4mm saccular outpouching in ACOM c/w mild recannulization in neck. 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 g.) 3.7x79mm remnant of prev Tx'd ACOM aneur -failed embol 02/24/2012.   CKD (chronic kidney disease), stage IV (HCC)    a.) solitary functioning kidney on the RIGHT   Complication of anesthesia    a.) delayed emergence   Coronary artery disease 02/23/2007   a.) LHC 02/23/2007 --> EF 50%; 30% pLAD, 70% mRCA --> planned for staged PCI. b.) PCI 02/27/2007: EF 60%; 3.5 x 15 mm Vision BMS to 80% mRCA. b.) LHC 07/24/2007: EF 60%; minor irregs; no occlusive CAD; no intervention. c.) LHC 10/28/2010: EF 60%; 30% mLAD, LCx with minor luminal irregs, 20% ISR mRCA; no intervention. d.) LHC 05/31/2013: EF 60%; 40% mLAD, 30% ISR m-dRCA; no interventions.   DDD (degenerative disc disease), cervical    a.) s/p  ACDF C5-C7; hardware in neck; patient appreciates stiffness and issues with mobility   Diastolic dysfunction    a.)  TTE 07/07/2021: EF 87.8%; normal PASP; trace TR/MR; G1DD.   Elevated LEFT hemidiaphragm    Gout    Headache(784.0)    Heart murmur    HLD (hyperlipidemia)    HOH (hard of hearing)    Has hearing aids, doesn't wear   Hyperkalemia    Hyperparathyroidism due to renal insufficiency (HCC)    Hypertension    Incomplete right bundle branch block (RBBB)    Insomnia    a.) takes zolpidem   Long term (current) use of immunomodulator    a.) on DMARD therapy (hydroxychloroquine) for RA/SLE Dx.   Lumbar adjacent segment disease with spondylolisthesis    Multiple acquired cysts of kidney    On chronic clopidogrel therapy    OSA (obstructive sleep apnea)    a.) does NOT use nocturnal pap therapy   Osteoarthritis    Pancreatic cyst    Peripheral vascular disease (HCC)    Pneumonia    Post-COVID chronic cough    Pulmonary nodule    RBBB (right bundle branch block)    Rectal bleeding    Rheumatoid arthritis (HCC)    a.) on DMARD; hydroxychloriquine   Shortness of breath 03/01/2023   Systemic lupus erythematosus (HCC)    T2DM (type 2 diabetes mellitus) (HCC)    Tendinitis of left wrist    Thickened  endometrium    Wears dentures    partial lower    Past Surgical History:  Procedure Laterality Date   ANEURYSM COILING  08/2013   ANTERIOR LATERAL LUMBAR FUSION WITH PERCUTANEOUS SCREW 1 LEVEL N/A 03/09/2023   Procedure: L3-4 LATERAL LUMBAR INTERBODY FUSION;  Surgeon: Venetia Night, MD;  Location: [facility];  Service: Neurosurgery;  Laterality: N/A;   APPLICATION OF INTRAOPERATIVE CT SCAN N/A 03/09/2023   Procedure: APPLICATION OF INTRAOPERATIVE CT SCAN;  Surgeon: Venetia Night, MD;  Location: [facility];  Service: Neurosurgery;  Laterality: N/A;   Attempted embolization of previously treated ACOM aneurysm; unsuccessful N/A 01/28/2012   Location: [facility]    BACK SURGERY     BARTHOLIN CYST MARSUPIALIZATION     BICEPT TENODESIS Right 03/02/2022   Procedure: BICEPS TENODESIS;  Surgeon: Christena Flake, MD;  Location: [facility];  Service: Orthopedics;  Laterality: Right;   BREAST CYST ASPIRATION Right 01/25/2012   FNA neg.   BREAST EXCISIONAL BIOPSY Left 06/26/2007   neg   BUNIONECTOMY Bilateral 2022   CARDIAC CATHETERIZATION Left 02/23/2007   Procedure: CARDIAC CATHETERIZATION; Location: [facility]; Surgeon: Adrian Blackwater, MD   CARDIAC CATHETERIZATION Left 07/31/2007   Procedure: CARDIAC CATHETERIZATION; Location: [facility]; Surgeon: Adrian Blackwater, MD   CARDIAC CATHETERIZATION Left 10/28/2010   Procedure: CARDIAC CATHETERIZATION; Location: [facility]; Surgeon: Adrian Blackwater, MD   CARDIAC CATHETERIZATION Left 05/31/2013   Procedure: CARDIAC CATHETERIZATION; Location: [facility]; Surgeon: Adrian Blackwater, MD   Carotid arteriogram; 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 N/A 07/18/2006   Location: [facility]   CATARACT EXTRACTION     CERVICAL FUSION N/A    Procedure: ACDF C5-C7   COLONOSCOPY WITH PROPOFOL N/A 10/04/2019   Procedure: COLONOSCOPY WITH BIOPSY;  Surgeon: Toney Reil, MD;  Location: [facility];  Service: Endoscopy;  Laterality: N/A;  Diabetic (borderline) - oral meds priority 3   CORONARY STENT INTERVENTION Left 02/27/2007   Procedure: CORONARY STENT INTERVENTION (3.5 x 15 mm Vision BMS to mRCA); Location: [facility]; Surgeon: Lorine Bears, MD   Endovascular near complete obliteration of enlarging neck remnant of previously treated RIGHT MCA aneurysm using stent assisted coiling N/A 09/03/2010   Location: [facility]   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 N/A 10/03/2006   Location: [facility]   HYSTEROSCOPY WITH D & C N/A 08/04/2021   Procedure: DILATATION AND CURETTAGE /HYSTEROSCOPY;  Surgeon: Natale Milch, MD;  Location: [facility];  Service:  Gynecology;  Laterality: N/A;   POLYPECTOMY N/A 10/04/2019   Procedure: POLYPECTOMY;  Surgeon: Toney Reil, MD;  Location: [facility];  Service: Endoscopy;  Laterality: N/A;   REVERSE SHOULDER ARTHROPLASTY Right 03/02/2022   Procedure: REVERSE SHOULDER ARTHROPLASTY;  Surgeon: Christena Flake, MD;  Location: [facility];  Service: Orthopedics;  Laterality: Right;   UPPER ESOPHAGEAL ENDOSCOPIC ULTRASOUND (EUS) N/A 04/15/2016   Procedure: UPPER ESOPHAGEAL ENDOSCOPIC ULTRASOUND (EUS);  Surgeon: Bearl Mulberry, MD;  Location: [facility];  Service: Gastroenterology;  Laterality: N/A;    Social History   Socioeconomic History   Marital status: Widowed    Spouse name: Not on file   Number of children: Not on file   Years of education: Not on file   Highest education level: Not on file  Occupational History   Not on file  Tobacco Use   Smoking status: Never   Smokeless tobacco: Never  Vaping Use   Vaping status: Never Used  Substance and Sexual Activity  Alcohol use: Yes    Comment: Occasionally at christmas   Drug use: No   Sexual activity: Not Currently    Birth control/protection: Post-menopausal  Other Topics Concern   Not on file  Social History Narrative   Lives with daughter   Social Drivers of Health   Financial Resource Strain: Low Risk  (02/12/2022)   Overall Financial Resource Strain (CARDIA)    Difficulty of Paying Living Expenses: Not very hard  Food Insecurity: No Food Insecurity (03/09/2023)   Hunger Vital Sign    Worried About Running Out of Food in the Last Year: Never true    Ran Out of Food in the Last Year: Never true  Transportation Needs: No Transportation Needs (03/09/2023)   PRAPARE - Administrator, Civil Service (Medical): No    Lack of Transportation (Non-Medical): No  Physical Activity: Not on file  Stress: No Stress Concern Present (02/26/2022)   [employer] of Occupational Health - Occupational Stress  Questionnaire    Feeling of Stress : Not at all  Social Connections: Moderately Isolated (02/26/2022)   Social Connection and Isolation Panel [NHANES]    Frequency of Communication with Friends and Family: More than three times a week    Frequency of Social Gatherings with Friends and Family: More than three times a week    Attends Religious Services: Never    Database administrator or Organizations: Yes    Attends Banker Meetings: Never    Marital Status: Widowed  Intimate Partner Violence: Not At Risk (03/09/2023)   Humiliation, Afraid, Rape, and Kick questionnaire    Fear of Current or Ex-Partner: No    Emotionally Abused: No    Physically Abused: No    Sexually Abused: No    Family History  Problem Relation Age of Onset   Breast cancer Paternal Aunt 48   Breast cancer Paternal Aunt    Heart disease Mother    Heart disease Father    Stroke Father    Diabetes Sister    Diabetes Brother     Allergies  Allergen Reactions   Isosorbide Dinitrate Other (See Comments)    Collapse   Amlodipine Swelling   Sulfa Antibiotics Itching   Tizanidine Hcl    Oxycodone Itching   Sulfasalazine Itching    Outpatient Medications Prior to Visit  Medication Sig   allopurinol (ZYLOPRIM) 100 MG tablet TAKE 1 TABLET BY MOUTH EVERY DAY   clotrimazole-betamethasone (LOTRISONE) cream APPLY TWICE A DAY TO GROIN AREA SKIN FOR 1 WEEK AND THEN AS NEEDED.   fluticasone (FLONASE) 50 MCG/ACT nasal spray SPRAY 2 SPRAYS INTO EACH NOSTRIL EVERY DAY (Patient taking differently: Place 1 spray into both nostrils daily as needed.)   furosemide (LASIX) 40 MG tablet TAKE 1 TABLET BY MOUTH EVERY DAY   gabapentin (NEURONTIN) 300 MG capsule Take 1 capsule (300 mg total) by mouth at bedtime.   glimepiride (AMARYL) 2 MG tablet Take 1 tablet (2 mg total) by mouth See admin instructions. Take 1 mg daily, may increase to 2 mg if blood sugar is over 175 (Patient taking differently: Take 2 mg by mouth daily  with breakfast.)   hydrALAZINE (APRESOLINE) 100 MG tablet TAKE 1 TABLET BY MOUTH TWICE A DAY (Patient taking differently: Take 100 mg by mouth 2 (two) times daily. Takes/1/2 tablet BID)   hydroxychloroquine (PLAQUENIL) 200 MG tablet Take 200 mg by mouth daily.   labetalol (NORMODYNE) 300 MG tablet TAKE 1 TABLET BY MOUTH TWICE  A DAY   losartan (COZAAR) 100 MG tablet Take 1 tablet (100 mg total) by mouth every morning.   methocarbamol (ROBAXIN) 500 MG tablet TAKE 1 TABLET BY MOUTH EVERY 6 HOURS AS NEEDED FOR MUSCLE SPASMS.   nitroGLYCERIN (NITROSTAT) 0.6 MG SL tablet TAKE 1 TABLET BY MOUTH AS NEEDED FOR CP. WAIT 5 MINUTES BEFORE NEXT DOSE. PROCEED TO ER IF NO RELIEF AFTER 3 DOSES   pantoprazole (PROTONIX) 40 MG tablet TAKE 1 TABLET BY MOUTH EVERY DAY   rosuvastatin (CRESTOR) 40 MG tablet TAKE 1 TABLET BY MOUTH EVERY DAY   senna (SENOKOT) 8.6 MG TABS tablet Take 1 tablet (8.6 mg total) by mouth daily as needed for mild constipation.   sodium zirconium cyclosilicate (LOKELMA) 10 g PACK packet Take 10 g by mouth as needed.   SYMBICORT 80-4.5 MCG/ACT inhaler INHALE 1 PUFF BY MOUTH TWICE A DAY   traMADol (ULTRAM) 50 MG tablet Take 1 tablet (50 mg total) by mouth every 6 (six) hours as needed for moderate pain (pain score 4-6).   tretinoin (RETIN-A) 0.025 % cream TWICE A DAY   zolpidem (AMBIEN) 5 MG tablet TAKE 1 TABLET BY MOUTH EVERY DAY AT BEDTIME AS NEEDED FOR SLEEP   No facility-administered medications prior to visit.    Review of Systems  Constitutional:  Positive for malaise/fatigue. Negative for chills, diaphoresis, fever and weight loss.  HENT: Negative.  Negative for sore throat.   Eyes: Negative.   Respiratory: Negative.  Negative for cough, shortness of breath and stridor.   Cardiovascular: Negative.  Negative for chest pain, palpitations and leg swelling.  Gastrointestinal:  Positive for abdominal pain (RUQ). Negative for constipation, diarrhea, heartburn, nausea and vomiting.   Genitourinary: Negative.  Negative for dysuria and flank pain.  Musculoskeletal: Negative.  Negative for joint pain and myalgias.  Skin: Negative.   Neurological: Negative.  Negative for dizziness, tingling, tremors, sensory change and headaches.  Endo/Heme/Allergies: Negative.   Psychiatric/Behavioral: Negative.  Negative for depression and suicidal ideas. The patient is not nervous/anxious.        Objective:   BP (!) 146/96   Pulse 64   Ht 5\' 3"  (1.6 m)   Wt 180 lb 6.4 oz (81.8 kg)   SpO2 97%   BMI 31.96 kg/m   Vitals:   08/05/23 1307  BP: (!) 146/96  Pulse: 64  Height: 5\' 3"  (1.6 m)  Weight: 180 lb 6.4 oz (81.8 kg)  SpO2: 97%  BMI (Calculated): 31.96    Physical Exam Vitals and nursing note reviewed.  Constitutional:      Appearance: Normal appearance.  HENT:     Head: Normocephalic and atraumatic.     Nose: Nose normal.     Mouth/Throat:     Mouth: Mucous membranes are moist.     Pharynx: Oropharynx is clear.  Eyes:     Conjunctiva/sclera: Conjunctivae normal.     Pupils: Pupils are equal, round, and reactive to light.  Cardiovascular:     Rate and Rhythm: Normal rate and regular rhythm.     Pulses: Normal pulses.     Heart sounds: Normal heart sounds. No murmur heard. Pulmonary:     Effort: Pulmonary effort is normal.     Breath sounds: Normal breath sounds. No wheezing.  Abdominal:     General: Bowel sounds are normal.     Palpations: Abdomen is soft.     Tenderness: There is no abdominal tenderness. There is no right CVA tenderness or left CVA tenderness.  Musculoskeletal:        General: Normal range of motion.     Cervical back: Normal range of motion.     Right lower leg: No edema.     Left lower leg: No edema.  Skin:    General: Skin is warm and dry.  Neurological:     General: No focal deficit present.     Mental Status: She is alert and oriented to person, place, and time.  Psychiatric:        Mood and Affect: Mood normal.         Behavior: Behavior normal.      Results for orders placed or performed in visit on 08/05/23  POCT CBG (Fasting - Glucose)  Result Value Ref Range   Glucose Fasting, POC 171 (A) 70 - 99 mg/dL  POCT Urinalysis Dipstick (81002)  Result Value Ref Range   Color, UA yellow    Clarity, UA cloudy    Glucose, UA Negative Negative   Bilirubin, UA neg    Ketones, UA neg    Spec Grav, UA 1.025 1.010 - 1.025   Blood, UA neg    pH, UA 5.5 5.0 - 8.0   Protein, UA Positive (A) Negative   Urobilinogen, UA 0.2 0.2 or 1.0 E.U./dL   Nitrite, UA pos    Leukocytes, UA Small (1+) (A) Negative   Appearance cloudy    Odor yes     Recent Results (from the past 2160 hours)  POCT Glucose (CBG)     Status: Abnormal   Collection Time: 05/30/23  1:15 PM  Result Value Ref Range   POC Glucose 152 (A) 70 - 99 mg/dl  CBC with Diff     Status: Abnormal   Collection Time: 06/21/23  9:10 AM  Result Value Ref Range   WBC 5.2 3.4 - 10.8 x10E3/uL   RBC 3.12 (L) 3.77 - 5.28 x10E6/uL   Hemoglobin 9.0 (L) 11.1 - 15.9 g/dL   Hematocrit 16.1 (L) 09.6 - 46.6 %   MCV 93 79 - 97 fL   MCH 28.8 26.6 - 33.0 pg   MCHC 30.9 (L) 31.5 - 35.7 g/dL   RDW 04.5 40.9 - 81.1 %   Platelets 173 150 - 450 x10E3/uL   Neutrophils 59 Not Estab. %   Lymphs 30 Not Estab. %   Monocytes 6 Not Estab. %   Eos 4 Not Estab. %   Basos 1 Not Estab. %   Neutrophils Absolute 3.0 1.4 - 7.0 x10E3/uL   Lymphocytes Absolute 1.6 0.7 - 3.1 x10E3/uL   Monocytes Absolute 0.3 0.1 - 0.9 x10E3/uL   EOS (ABSOLUTE) 0.2 0.0 - 0.4 x10E3/uL   Basophils Absolute 0.0 0.0 - 0.2 x10E3/uL   Immature Granulocytes 0 Not Estab. %   Immature Grans (Abs) 0.0 0.0 - 0.1 x10E3/uL  CMP14+EGFR     Status: Abnormal   Collection Time: 06/21/23  9:10 AM  Result Value Ref Range   Glucose 72 70 - 99 mg/dL   BUN 55 (H) 8 - 27 mg/dL   Creatinine, Ser 9.14 (H) 0.57 - 1.00 mg/dL   eGFR 20 (L) >78 GN/FAO/1.30   BUN/Creatinine Ratio 22 12 - 28   Sodium 147 (H) 134 - 144  mmol/L   Potassium 4.5 3.5 - 5.2 mmol/L   Chloride 117 (HH) 96 - 106 mmol/L   CO2 18 (L) 20 - 29 mmol/L   Calcium 9.3 8.7 - 10.3 mg/dL   Total Protein 5.8 (L) 6.0 - 8.5  g/dL   Albumin 3.9 3.8 - 4.8 g/dL   Globulin, Total 1.9 1.5 - 4.5 g/dL   Bilirubin Total 0.5 0.0 - 1.2 mg/dL   Alkaline Phosphatase 85 44 - 121 IU/L   AST 13 0 - 40 IU/L   ALT 11 0 - 32 IU/L  Hemoglobin A1c     Status: Abnormal   Collection Time: 06/21/23  9:10 AM  Result Value Ref Range   Hgb A1c MFr Bld 6.5 (H) 4.8 - 5.6 %    Comment:          Prediabetes: 5.7 - 6.4          Diabetes: >6.4          Glycemic control for adults with diabetes: <7.0    Est. average glucose Bld gHb Est-mCnc 140 mg/dL  Lipid Panel w/o Chol/HDL Ratio     Status: Abnormal   Collection Time: 06/21/23  9:10 AM  Result Value Ref Range   Cholesterol, Total 195 100 - 199 mg/dL   Triglycerides 829 (H) 0 - 149 mg/dL   HDL 38 (L) >56 mg/dL   VLDL Cholesterol Cal 28 5 - 40 mg/dL   LDL Chol Calc (NIH) 213 (H) 0 - 99 mg/dL  POCT Glucose (CBG)     Status: Abnormal   Collection Time: 06/24/23 11:54 AM  Result Value Ref Range   POC Glucose 137 (A) 70 - 99 mg/dl  POCT CBG (Fasting - Glucose)     Status: Abnormal   Collection Time: 08/05/23  1:11 PM  Result Value Ref Range   Glucose Fasting, POC 171 (A) 70 - 99 mg/dL  POCT Urinalysis Dipstick (08657)     Status: Abnormal   Collection Time: 08/05/23  1:40 PM  Result Value Ref Range   Color, UA yellow    Clarity, UA cloudy    Glucose, UA Negative Negative   Bilirubin, UA neg    Ketones, UA neg    Spec Grav, UA 1.025 1.010 - 1.025   Blood, UA neg    pH, UA 5.5 5.0 - 8.0   Protein, UA Positive (A) Negative   Urobilinogen, UA 0.2 0.2 or 1.0 E.U./dL   Nitrite, UA pos    Leukocytes, UA Small (1+) (A) Negative   Appearance cloudy    Odor yes       Assessment & Plan:  Surgical evaluation for right upper quadrant pain and gallstones.  Urine sent for culture and sensitivity.  Empirically  start p.o. Augmentin. Problem List Items Addressed This Visit     Rheumatoid arthritis involving multiple sites with positive rheumatoid factor (HCC)   Type 2 diabetes mellitus with hyperglycemia, without long-term current use of insulin (HCC)   Relevant Orders   POCT CBG (Fasting - Glucose) (Completed)   Hypertension associated with diabetes (HCC) - Primary   Relevant Orders   CMP14+EGFR   Chronic kidney disease, stage IV (severe) (HCC)   Combined hyperlipidemia associated with type 2 diabetes mellitus (HCC)   Other Visit Diagnoses       Right upper quadrant pain       Relevant Orders   Ambulatory referral to General Surgery     Multiple gallstones       Relevant Orders   Ambulatory referral to General Surgery     Screening for blood or protein in urine       Relevant Orders   POCT Urinalysis Dipstick (81002) (Completed)     Urinary tract infection without hematuria, site unspecified  Relevant Medications   amoxicillin-clavulanate (AUGMENTIN) 500-125 MG tablet   Other Relevant Orders   Urine Culture       Return in about 3 months (around 11/02/2023).   Total time spent: 30 minutes  Margaretann Loveless, MD  08/05/2023   This document may have been prepared by [facility] Voice Recognition software and as such may include unintentional dictation errors.

## 2023-08-06 LAB — CMP14+EGFR
ALT: 6 [IU]/L (ref 0–32)
AST: 13 [IU]/L (ref 0–40)
Albumin: 3.9 g/dL (ref 3.8–4.8)
Alkaline Phosphatase: 81 [IU]/L (ref 44–121)
BUN/Creatinine Ratio: 16 (ref 12–28)
BUN: 41 mg/dL — ABNORMAL HIGH (ref 8–27)
Bilirubin Total: 0.7 mg/dL (ref 0.0–1.2)
CO2: 22 mmol/L (ref 20–29)
Calcium: 9.6 mg/dL (ref 8.7–10.3)
Chloride: 113 mmol/L — ABNORMAL HIGH (ref 96–106)
Creatinine, Ser: 2.58 mg/dL — ABNORMAL HIGH (ref 0.57–1.00)
Globulin, Total: 2 g/dL (ref 1.5–4.5)
Glucose: 114 mg/dL — ABNORMAL HIGH (ref 70–99)
Potassium: 5.2 mmol/L (ref 3.5–5.2)
Sodium: 148 mmol/L — ABNORMAL HIGH (ref 134–144)
Total Protein: 5.9 g/dL — ABNORMAL LOW (ref 6.0–8.5)
eGFR: 19 mL/min/{1.73_m2} — ABNORMAL LOW (ref >59)

## 2023-08-09 DIAGNOSIS — E119 Type 2 diabetes mellitus without complications: Secondary | ICD-10-CM | POA: Diagnosis not present

## 2023-08-09 LAB — URINE CULTURE

## 2023-08-09 LAB — COMPLIANCE DRUG ANALYSIS, UR

## 2023-08-13 ENCOUNTER — Other Ambulatory Visit: Payer: Self-pay | Admitting: Internal Medicine

## 2023-08-15 NOTE — Progress Notes (Unsigned)
 Patient ID: Beatrice Lecher, female   DOB: [birth year], 78 y.o.   MRN: [id number]  Chief Complaint: Gallstones, questionably symptomatic.  History of Present Illness Ladaija LARA PALINKAS is a 78 y.o. female with a long medical history as noted below, most recently underwent back surgery.  Has some lingering right sided back and side issues since.  She says she has a very high oil/fried food diet eating primarily [place] and [place] cuisine.  She reports this pain does not seem to be exacerbated by eating at all.  She denies any history of nausea postprandially, denies the presence of heartburn.  But does note that the ingestion of milk gives her significant gas.  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 b.)07/18/2006 -endovas oblit complex ACOM aneur. c.) Endovas Tx of 6.8x9mm RMCA aneur. d.) 3.5x90mm remnant RMCA aneur 2/2 coil compaction. e.) Interval 3.2x3.36mm saccular outpouching in ACOM c/w mild recannulization in neck. 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 g.) 3.7x41mm remnant of prev Tx'd ACOM aneur -failed embol 02/24/2012.   CKD (chronic kidney disease), stage IV (HCC)    a.) solitary functioning kidney on the RIGHT   Complication of anesthesia    a.) delayed emergence   Coronary artery disease 02/23/2007   a.) LHC 02/23/2007 --> EF 50%; 30% pLAD, 70% mRCA --> planned for staged PCI. b.) PCI 02/27/2007: EF 60%; 3.5 x 15 mm Vision BMS to 80% mRCA. b.) LHC 07/24/2007: EF 60%; minor irregs; no occlusive CAD; no intervention. c.) LHC 10/28/2010: EF 60%; 30% mLAD, LCx with minor luminal irregs, 20% ISR mRCA; no intervention. d.) LHC 05/31/2013: EF 60%; 40% mLAD, 30% ISR m-dRCA; no interventions.   DDD (degenerative disc disease), cervical    a.) s/p ACDF C5-C7; hardware in neck; patient appreciates stiffness and issues with mobility    Diastolic dysfunction    a.)  TTE 07/07/2021: EF 87.8%; normal PASP; trace TR/MR; G1DD.   Elevated LEFT hemidiaphragm    Gout    Headache(784.0)    Heart murmur    HLD (hyperlipidemia)    HOH (hard of hearing)    Has hearing aids, doesn't wear   Hyperkalemia    Hyperparathyroidism due to renal insufficiency (HCC)    Hypertension    Incomplete right bundle branch block (RBBB)    Insomnia    a.) takes zolpidem   Long term (current) use of immunomodulator    a.) on DMARD therapy (hydroxychloroquine) for RA/SLE Dx.   Lumbar adjacent segment disease with spondylolisthesis    Multiple acquired cysts of kidney    On chronic clopidogrel therapy    OSA (obstructive sleep apnea)    a.) does NOT use nocturnal pap therapy   Osteoarthritis    Pancreatic cyst    Peripheral vascular disease (HCC)    Pneumonia    Post-COVID chronic cough    Pulmonary nodule    RBBB (right bundle branch block)    Rectal bleeding    Rheumatoid arthritis (HCC)    a.) on DMARD; hydroxychloriquine   Shortness of breath 03/01/2023   Systemic lupus erythematosus (HCC)    T2DM (type 2 diabetes mellitus) (HCC)    Tendinitis of left wrist    Thickened endometrium    Wears dentures    partial lower      Past Surgical History:  Procedure Laterality  Date   ANEURYSM COILING  08/2013   ANTERIOR LATERAL LUMBAR FUSION WITH PERCUTANEOUS SCREW 1 LEVEL N/A 03/09/2023   Procedure: L3-4 LATERAL LUMBAR INTERBODY FUSION;  Surgeon: Venetia Night, MD;  Location: [facility];  Service: Neurosurgery;  Laterality: N/A;   APPLICATION OF INTRAOPERATIVE CT SCAN N/A 03/09/2023   Procedure: APPLICATION OF INTRAOPERATIVE CT SCAN;  Surgeon: Venetia Night, MD;  Location: [facility];  Service: Neurosurgery;  Laterality: N/A;   Attempted embolization of previously treated ACOM aneurysm; unsuccessful N/A 01/28/2012   Location: [facility]   BACK SURGERY     BARTHOLIN CYST MARSUPIALIZATION     BICEPT TENODESIS Right  03/02/2022   Procedure: BICEPS TENODESIS;  Surgeon: Christena Flake, MD;  Location: [facility];  Service: Orthopedics;  Laterality: Right;   BREAST CYST ASPIRATION Right 01/25/2012   FNA neg.   BREAST EXCISIONAL BIOPSY Left 06/26/2007   neg   BUNIONECTOMY Bilateral 2022   CARDIAC CATHETERIZATION Left 02/23/2007   Procedure: CARDIAC CATHETERIZATION; Location: [facility]; Surgeon: Adrian Blackwater, MD   CARDIAC CATHETERIZATION Left 07/31/2007   Procedure: CARDIAC CATHETERIZATION; Location: [facility]; Surgeon: Adrian Blackwater, MD   CARDIAC CATHETERIZATION Left 10/28/2010   Procedure: CARDIAC CATHETERIZATION; Location: [facility]; Surgeon: Adrian Blackwater, MD   CARDIAC CATHETERIZATION Left 05/31/2013   Procedure: CARDIAC CATHETERIZATION; Location: [facility]; Surgeon: Adrian Blackwater, MD   Carotid arteriogram; 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 N/A 07/18/2006   Location: [facility]   CATARACT EXTRACTION     CERVICAL FUSION N/A    Procedure: ACDF C5-C7   COLONOSCOPY WITH PROPOFOL N/A 10/04/2019   Procedure: COLONOSCOPY WITH BIOPSY;  Surgeon: Toney Reil, MD;  Location[facility];  Service: Endoscopy;  Laterality: N/A;  Diabetic (borderline) - oral meds priority 3   CORONARY STENT INTERVENTION Left 02/27/2007   Procedure: CORONARY STENT INTERVENTION (3.5 x 15 mm Vision BMS to mRCA); Location: [facility]; Surgeon: Lorine Bears, MD   Endovascular near complete obliteration of enlarging neck remnant of previously treated RIGHT MCA aneurysm using stent assisted coiling N/A 09/03/2010   Location: [facility]   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 N/A 10/03/2006   Location: [facility]   HYSTEROSCOPY WITH D & C N/A 08/04/2021   Procedure: DILATATION AND CURETTAGE /HYSTEROSCOPY;  Surgeon: Natale Milch, MD;  Location: [facility];  Service: Gynecology;  Laterality: N/A;   POLYPECTOMY N/A 10/04/2019   Procedure:  POLYPECTOMY;  Surgeon: Toney Reil, MD;  Location: [facility];  Service: Endoscopy;  Laterality: N/A;   REVERSE SHOULDER ARTHROPLASTY Right 03/02/2022   Procedure: REVERSE SHOULDER ARTHROPLASTY;  Surgeon: Christena Flake, MD;  Location: [facility];  Service: Orthopedics;  Laterality: Right;   UPPER ESOPHAGEAL ENDOSCOPIC ULTRASOUND (EUS) N/A 04/15/2016   Procedure: UPPER ESOPHAGEAL ENDOSCOPIC ULTRASOUND (EUS);  Surgeon: Bearl Mulberry, MD;  Location[facility];  Service: Gastroenterology;  Laterality: N/A;    Allergies  Allergen Reactions   Isosorbide Dinitrate Other (See Comments)    Collapse   Amlodipine Swelling   Sulfa Antibiotics Itching   Tizanidine Hcl    Oxycodone Itching   Sulfasalazine Itching    Current Outpatient Medications  Medication Sig Dispense Refill   allopurinol (ZYLOPRIM) 100 MG tablet TAKE 1 TABLET BY MOUTH EVERY DAY 90 tablet 3   amoxicillin-clavulanate (AUGMENTIN) 500-125 MG tablet Take 1 tablet by mouth 2 (two) times daily. 14 tablet 0   clotrimazole-betamethasone (LOTRISONE) cream APPLY TWICE A DAY TO GROIN AREA SKIN FOR 1  WEEK AND THEN AS NEEDED. 45 g 1   fluticasone (FLONASE) 50 MCG/ACT nasal spray SPRAY 2 SPRAYS INTO EACH NOSTRIL EVERY DAY (Patient taking differently: Place 1 spray into both nostrils daily as needed.) 48 mL 3   furosemide (LASIX) 40 MG tablet TAKE 1 TABLET BY MOUTH EVERY DAY 90 tablet 25   gabapentin (NEURONTIN) 300 MG capsule Take 1 capsule (300 mg total) by mouth at bedtime. 30 capsule 0   glimepiride (AMARYL) 2 MG tablet Take 1 tablet (2 mg total) by mouth See admin instructions. Take 1 mg daily, may increase to 2 mg if blood sugar is over 175 (Patient taking differently: Take 2 mg by mouth daily with breakfast.) 180 tablet 3   hydrALAZINE (APRESOLINE) 100 MG tablet TAKE 1 TABLET BY MOUTH TWICE A DAY (Patient taking differently: Take 100 mg by mouth 2 (two) times daily. Takes/1/2 tablet BID) 180 tablet 1    hydroxychloroquine (PLAQUENIL) 200 MG tablet Take 200 mg by mouth daily.     labetalol (NORMODYNE) 300 MG tablet TAKE 1 TABLET BY MOUTH TWICE A DAY 180 tablet 3   losartan (COZAAR) 100 MG tablet Take 1 tablet (100 mg total) by mouth every morning. 90 tablet 3   methocarbamol (ROBAXIN) 500 MG tablet TAKE 1 TABLET BY MOUTH EVERY 6 HOURS AS NEEDED FOR MUSCLE SPASMS. 120 tablet 0   nitroGLYCERIN (NITROSTAT) 0.6 MG SL tablet TAKE 1 TABLET BY MOUTH AS NEEDED FOR CP. WAIT 5 MINUTES BEFORE NEXT DOSE. PROCEED TO ER IF NO RELIEF AFTER 3 DOSES 100 tablet 1   pantoprazole (PROTONIX) 40 MG tablet TAKE 1 TABLET BY MOUTH EVERY DAY 90 tablet 3   rosuvastatin (CRESTOR) 40 MG tablet TAKE 1 TABLET BY MOUTH EVERY DAY 90 tablet 0   senna (SENOKOT) 8.6 MG TABS tablet Take 1 tablet (8.6 mg total) by mouth daily as needed for mild constipation. 30 tablet 0   SYMBICORT 80-4.5 MCG/ACT inhaler INHALE 1 PUFF BY MOUTH TWICE A DAY 10.2 each 2   traMADol (ULTRAM) 50 MG tablet Take 1 tablet (50 mg total) by mouth every 6 (six) hours as needed for moderate pain (pain score 4-6). 30 tablet 0   tretinoin (RETIN-A) 0.025 % cream TWICE A DAY 45 g 6   tretinoin (RETIN-A) 0.05 % cream APPLY TO AFFECTED AREA EVERY DAY AT BEDTIME 45 g 3   zolpidem (AMBIEN) 5 MG tablet TAKE 1 TABLET BY MOUTH EVERY DAY AT BEDTIME AS NEEDED FOR SLEEP 30 tablet 0   No current facility-administered medications for this visit.    Family History Family History  Problem Relation Age of Onset   Breast cancer Paternal Aunt 46   Breast cancer Paternal Aunt    Heart disease Mother    Heart disease Father    Stroke Father    Diabetes Sister    Diabetes Brother       Social History Social History   Tobacco Use   Smoking status: Never    Passive exposure: Never   Smokeless tobacco: Never  Vaping Use   Vaping status: Never Used  Substance Use Topics   Alcohol use: Yes    Comment: Occasionally at christmas   Drug use: No        Review of  Systems  Constitutional: Negative.   HENT:  Positive for hearing loss.   Eyes:  Positive for blurred vision.  Respiratory:  Positive for shortness of breath.   Cardiovascular:  Positive for chest pain and leg swelling.  Gastrointestinal:  Positive for abdominal pain.  Genitourinary:  Positive for flank pain.  Skin: Negative.   Neurological:  Positive for headaches.  Psychiatric/Behavioral: Negative.       Physical Exam Blood pressure 139/76, pulse 69, temperature 98.2 F (36.8 C), height 5\' 3"  (1.6 m), weight 175 lb (79.4 kg), SpO2 98%. Last Weight  Most recent update: 08/16/2023  2:05 PM    Weight  79.4 kg (175 lb)             CONSTITUTIONAL: Well developed, and nourished, appropriately responsive and aware without distress.   EYES: Sclera non-icteric.   EARS, NOSE, MOUTH AND THROAT:  The oropharynx is clear. Oral mucosa is pink and moist.  Hearing is intact to voice.  NECK: Trachea is midline, and there is no jugular venous distension.  LYMPH NODES:  Lymph nodes in the neck are not appreciated. RESPIRATORY:   Normal respiratory effort without pathologic use of accessory muscles. CARDIOVASCULAR:  Well perfused.  GI: The abdomen is  soft, nontender, and nondistended. There were no palpable masses.  I did not appreciate hepatosplenomegaly.  MUSCULOSKELETAL: There are 3 well-healed parallel scars in the lumbar back region.  There is a short 3 cm transverse scar over the right lowermost ribs. SKIN: Skin turgor is normal. No pathologic skin lesions appreciated.  NEUROLOGIC:  Motor and sensation appear grossly normal.  Cranial nerves are grossly without defect. PSYCH:  Alert and oriented to person, place and time. Affect is appropriate for situation.  Data Reviewed I have personally reviewed what is currently available of the patient's imaging, recent labs and medical records.   Labs:     Latest Ref Rng & Units 06/21/2023    9:10 AM 03/13/2023    8:01 AM 03/11/2023    6:31 AM   CBC  WBC 3.4 - 10.8 x10E3/uL 5.2  9.3  7.3   Hemoglobin 11.1 - 15.9 g/dL 9.0  8.4  7.6   Hematocrit 34.0 - 46.6 % 29.1  26.4  23.1   Platelets 150 - 450 x10E3/uL 173  143  115       Latest Ref Rng & Units 08/05/2023    2:08 PM 06/21/2023    9:10 AM 03/13/2023    8:01 AM  CMP  Glucose 70 - 99 mg/dL 829  72  562   BUN 8 - 27 mg/dL 41  55  54   Creatinine 0.57 - 1.00 mg/dL 1.30  8.65  7.84   Sodium 134 - 144 mmol/L 148  147  139   Potassium 3.5 - 5.2 mmol/L 5.2  4.5  4.6   Chloride 96 - 106 mmol/L 113  117  109   CO2 20 - 29 mmol/L 22  18  21    Calcium 8.7 - 10.3 mg/dL 9.6  9.3  8.7   Total Protein 6.0 - 8.5 g/dL 5.9  5.8  6.1   Total Bilirubin 0.0 - 1.2 mg/dL 0.7  0.5  1.1   Alkaline Phos 44 - 121 IU/L 81  85  56   AST 0 - 40 IU/L 13  13  29    ALT 0 - 32 IU/L 6  11  18      Imaging: Radiological images reviewed:  PROCEDURE: ULTRASOUND ABDOMEN COMPLETE   HISTORY: Patient is a 78 y/o Female with gallstones.Abdominal pain for five months.   COMPARISON: CT AP 03/13/2023, MRI abdomen 12/28/2022   TECHNIQUE: Two-dimensional grayscale and color Doppler ultrasound of the abdomen was performed.   FINDINGS: The  pancreas demonstrates a normal homogenous echotexture with the tail suboptimally visualized due to overlying bowel gas. There is a heterogeneous lesion with internal vascularity measuring 5.4 x 5.0 cm again identified within the head of the pancreas.   The liver demonstrates a normal echotexture without intrahepatic biliary dilatation. No masses are visualized. The main portal vein demonstrates normal hepatopedal flow.   The gallbladder partially contracted and demonstrates multiple gallstones without pericholecystic fluid or wall thickening. Echogenic foci with ring down artifact is identified along the anterior wall, suggestive of adenomyomatosis. The common bile duct measures 0.4 cm. Negative sonographic Murphy's sign.   The right kidney measures 12.2 cm in length.  Renal cortical echotexture is within normal limits. There is no hydronephrosis. There are no stones. There are multiple cysts with the largest measuring 7.5 cm at the superior pole.   The left kidney is absent.   The spleen measures 9.0 cm in length and demonstrates normal echotexture.   There is no evidence of aneurysm within the visualized segments of the abdominal aorta.   The visualized segments of the IVC are unremarkable.   IMPRESSION: 1. 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 Concordant with prior imaging.   2. Partially contracted gallbladder with multiple gallstones and possible adenomyomatosis. No biliary dilatation.   3.  Multiple right renal cysts.   4.  Absent left kidney.   Thank you for allowing [place] to assist in the care of this patient.     Electronically Signed   By: Lestine Box M.D.   On: 08/02/2023 16:06  CLINICAL DATA:  Pancreatic mass with apparent enlargement by prior  lumbar spine MR   EXAM:  MRI ABDOMEN WITHOUT AND WITH CONTRAST   TECHNIQUE:  Multiplanar multisequence MR imaging of the abdomen was performed  both before and after the administration of intravenous contrast.   CONTRAST:  8 mL Vueway gadolinium contrast IV   COMPARISON:  MR lumbar spine, 11/25/2022, MR abdomen, 09/22/2019   FINDINGS:  Lower chest: No acute abnormality.   Hepatobiliary: No solid liver abnormality is seen. Sludge contracted  in the gallbladder. No gallstones, gallbladder wall thickening, or  biliary dilatation.   Pancreas: Large, complex, multiseptated cystic lesion of the central  pancreatic head with hemorrhagic or proteinaceous contents and  numerous contrast enhancing internal septation, measuring 4.7 x 4.3  cm (series 9, image 22). This is significantly enlarged in  comparison to prior examination dated 09/22/2019, at which time it  measured 2.4 x 2.2 cm. No pancreatic ductal dilatation or  surrounding inflammatory changes.   Spleen: Normal in size  without significant abnormality.   Adrenals/Urinary Tract: Unchanged, definitively benign macroscopic  fat containing right adrenal adenoma, for which no further follow-up  or characterization is required. Normal left adrenal gland. Severely  atrophic, dysplastic left kidney. Multiple simple, benign right  renal cortical cysts, as well as intrinsically T1 hyperintense,  nonenhancing hemorrhagic or proteinaceous cysts.   Stomach/Bowel: Stomach is within normal limits. No evidence of bowel  wall thickening, distention, or inflammatory changes.   Vascular/Lymphatic: No significant vascular findings are present. No  enlarged abdominal lymph nodes.   Other: No abdominal wall hernia or abnormality. No ascites.   Musculoskeletal: No acute or significant osseous findings.   IMPRESSION:  1. Large, complex, multiseptated cystic lesion of the central  pancreatic head with hemorrhagic or proteinaceous contents and  numerous contrast enhancing internal septations, measuring 4.7 x 4.3  cm. This is significantly enlarged in comparison to prior  examination, and appearance and  patient demographic most likely  reflects a serous cystadenoma.  2. Severely atrophic, dysplastic left kidney.  3. Sludge in the gallbladder.    Electronically Signed    By: Jearld Lesch M.D.    On: 12/30/2022 22:28  CLINICAL DATA:  Right upper quadrant abdominal pain for the past month. Known gallstones.   EXAM: US ABDOMEN LIMITED - RIGHT UPPER QUADRANT   COMPARISON:  None.   FINDINGS: Gallbladder:   Gallbladder appear shrunken and retracted. There are innumerable echogenic shadowing stones seen within the gallbladder with reference echogenic stone measuring 0.6 cm in length (image 11). This finding is associated with gallbladder wall thickening and suspected trace amount of pericholecystic fluid (representative image 16).   Common bile duct:   Diameter: Normal in size measuring 3.7 mm in diameter.    Liver:   Normal appearance of the liver. No intrahepatic biliary duct dilatation. No ascites.   Note is made of an approximately 4.8 x 3.9 x 3.4 cm anechoic cyst arising from the superior pole the right kidney. There is apparent diffuse increased echogenicity of the right renal parenchyma (image 56).   IMPRESSION: 1. Extensive cholelithiasis with findings worrisome for acute (versus acute on chronic) cholecystitis. Further evaluation nuclear medicine HIDA scan could be performed as clinically indicated. 2. Incidentally noted right-sided renal cyst. 3. Increased echogenicity of the right renal parenchyma, nonspecific though could be seen in the setting of medical renal disease. Clinical correlation is advised.     Electronically Signed   By: Simonne Come M.D.   On: 05/12/2016 08:35 Within last 24 hrs: No results found.  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   Lumbar facet arthropathy 08/04/2023   Compression of lumbar vertebra (HCC) 08/04/2023   Chronic pain syndrome 08/04/2023   Chronic hip pain, right 08/04/2023   Chronic kidney disease, stage IV (severe) (HCC) 06/24/2023   Hypertension associated with diabetes (HCC) 05/31/2023   Acute pain of right knee 05/31/2023   Absolute anemia 05/31/2023   History of lumbar fusion 03/09/2023   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   Allergy to mold 03/01/2023   Shortness of breath 03/01/2023   Type 2 diabetes mellitus with hyperglycemia, without long-term current use of insulin (HCC) 03/01/2023   Breast cancer screening by mammogram 12/16/2022   Acute cough 12/02/2022   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   Seasonal  allergic rhinitis due to pollen 08/26/2022   Essential hypertension, benign 08/26/2022   Mixed hyperlipidemia 08/26/2022   Rheumatoid arthritis involving multiple sites with positive rheumatoid factor (HCC) 08/26/2022   Tendinitis of left wrist 08/26/2022   Status post reverse total shoulder replacement, 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  We discussed robotic cholecystectomy, and the likelihood of successful surgery without an open operation. This was discussed thoroughly.   Risks and benefits have been discussed with the patient which include  but are not limited to anesthesia, bleeding, infection, biliary ductal injury, resulting in leak or stenosis, other associated unanticipated injuries affiliated with laparoscopic surgery.   Reviewed that removing the gallbladder will only address the symptoms related to the gallbladder itself.  I believe there is no desire to proceed at this time, accepting the risks of delaying treatment with understanding.  Questions elicited and answered to satisfaction.    No guarantees ever expressed or implied.  Face-to-face time spent with the patient and accompanying care providers(if present) was 40 minutes, spent counseling, educating, and coordinating care of the patient.    These notes generated with voice recognition software. I apologize for typographical errors.  Campbell Lerner M.D., FACS 08/16/2023, 2:29 PM

## 2023-08-16 ENCOUNTER — Encounter: Payer: Self-pay | Admitting: Surgery

## 2023-08-16 ENCOUNTER — Ambulatory Visit (INDEPENDENT_AMBULATORY_CARE_PROVIDER_SITE_OTHER): Payer: 59 | Admitting: Surgery

## 2023-08-16 VITALS — BP 139/76 | HR 69 | Temp 98.2°F | Ht 63.0 in | Wt 175.0 lb

## 2023-08-16 DIAGNOSIS — K8010 Calculus of gallbladder with chronic cholecystitis without obstruction: Secondary | ICD-10-CM | POA: Diagnosis not present

## 2023-08-16 NOTE — Patient Instructions (Addendum)
 You have requested to have your gallbladder removed. This will be done at [facility] with Dr. Claudine Mouton.  We will have you follow up with [place] at the end of May to be seen again and to discuss surgery and pick dates for this.  You will most likely be out of work 1-2 weeks for this surgery.  If you have FMLA or disability paperwork that needs filled out you may drop this off at our office or this can be faxed to [phone].  You will return after your post-op appointment with a lifting restriction for approximately 4 more weeks.  You will be able to eat anything you would like to following surgery. But, start by eating a bland diet and advance this as tolerated. The Gallbladder diet is below, please go as closely by this diet as possible prior to surgery to avoid any further attacks.  If you have any questions, please call our office.  Laparoscopic Cholecystectomy Laparoscopic cholecystectomy is surgery to remove the gallbladder. The gallbladder is located in the upper right part of the abdomen, behind the liver. It is a storage sac for bile, which is produced in the liver. Bile aids in the digestion and absorption of fats. Cholecystectomy is often done for inflammation of the gallbladder (cholecystitis). This condition is usually caused by a buildup of gallstones (cholelithiasis) in the gallbladder. Gallstones can block the flow of bile, and that can result in inflammation and pain. In severe cases, emergency surgery may be required. If emergency surgery is not required, you will have time to prepare for the procedure. Laparoscopic surgery is an alternative to open surgery. Laparoscopic surgery has a shorter recovery time. Your common bile duct may also need to be examined during the procedure. If stones are found in the common bile duct, they may be removed. LET [facility] CARE PROVIDER KNOW ABOUT: Any allergies you have. All medicines you are taking, including vitamins, herbs, eye  drops, creams, and over-the-counter medicines. Previous problems you or members of your family have had with the use of anesthetics. Any blood disorders you have. Previous surgeries you have had.  Any medical conditions you have. RISKS AND COMPLICATIONS Generally, this is a safe procedure. However, problems may occur, including: Infection. Bleeding. Allergic reactions to medicines. Damage to other structures or organs. A stone remaining in the common bile duct. A bile leak from the cyst duct that is clipped when your gallbladder is removed. The need to convert to open surgery, which requires a larger incision in the abdomen. This may be necessary if your surgeon thinks that it is not safe to continue with a laparoscopic procedure. BEFORE THE PROCEDURE Ask your health care provider about: Changing or stopping your regular medicines. This is especially important if you are taking diabetes medicines or blood thinners. Taking medicines such as aspirin and ibuprofen. These medicines can thin your blood. Do not take these medicines before your procedure if your health care provider instructs you not to. Follow instructions from your health care provider about eating or drinking restrictions. Let your health care provider know if you develop a cold or an infection before surgery. Plan to have someone take you home after the procedure. Ask your health care provider how your surgical site will be marked or identified. You may be given antibiotic medicine to help prevent infection. PROCEDURE To reduce your risk of infection: Your health care team will wash or sanitize their hands. Your skin will be washed with soap. An IV tube  may be inserted into one of your veins. You will be given a medicine to make you fall asleep (general anesthetic). A breathing tube will be placed in your mouth. The surgeon will make several small cuts (incisions) in your abdomen. A thin, lighted tube (laparoscope) that  has a tiny camera on the end will be inserted through one of the small incisions. The camera on the laparoscope will send a picture to a TV screen (monitor) in the operating room. This will give the surgeon a good view inside your abdomen. A gas will be pumped into your abdomen. This will expand your abdomen to give the surgeon more room to perform the surgery. Other tools that are needed for the procedure will be inserted through the other incisions. The gallbladder will be removed through one of the incisions. After your gallbladder has been removed, the incisions will be closed with stitches (sutures), staples, or skin glue. Your incisions may be covered with a bandage (dressing). The procedure may vary among health care providers and hospitals. AFTER THE PROCEDURE Your blood pressure, heart rate, breathing rate, and blood oxygen level will be monitored often until the medicines you were given have worn off. You will be given medicines as needed to control your pain.   This information is not intended to replace advice given to you by your health care provider. Make sure you discuss any questions you have with your health care provider.   Document Released: 06/07/2005 Document Revised: 02/26/2015 Document Reviewed: 01/17/2013 Elsevier Interactive Patient Education 2016 [employer] Inc[employer]   Low-Fat Diet for Gallbladder Conditions A low-fat diet can be helpful if you have pancreatitis or a gallbladder condition. With these conditions, your pancreas and gallbladder have trouble digesting fats. A healthy eating plan with less fat will help rest your pancreas and gallbladder and reduce your symptoms. WHAT DO I NEED TO KNOW ABOUT THIS DIET? Eat a low-fat diet. Reduce your fat intake to less than 20-30% of your total daily calories. This is less than 50-60 g of fat per day. Remember that you need some fat in your diet. Ask your dietician what your daily goal should be. Choose nonfat and low-fat healthy  foods. Look for the words "nonfat," "low fat," or "fat free." As a guide, look on the label and choose foods with less than 3 g of fat per serving. Eat only one serving. Avoid alcohol. Do not smoke. If you need help quitting, talk with your health care provider. Eat small frequent meals instead of three large heavy meals. WHAT FOODS CAN I EAT? Grains Include healthy grains and starches such as potatoes, wheat bread, fiber-rich cereal, and brown rice. Choose whole grain options whenever possible. In adults, whole grains should account for 45-65% of your daily calories.  Fruits and Vegetables Eat plenty of fruits and vegetables. Fresh fruits and vegetables add fiber to your diet. Meats and Other Protein Sources Eat lean meat such as chicken and pork. Trim any fat off of meat before cooking it. Eggs, fish, and beans are other sources of protein. In adults, these foods should account for 10-35% of your daily calories. Dairy Choose low-fat milk and dairy options. Dairy includes fat and protein, as well as calcium.  Fats and Oils Limit high-fat foods such as fried foods, sweets, baked goods, sugary drinks.  Other Creamy sauces and condiments, such as mayonnaise, can add extra fat. Think about whether or not you need to use them, or use smaller amounts or low fat options. WHAT  FOODS ARE NOT RECOMMENDED? High fat foods, such as: [employer]. Ice cream. [place] toast. Sweet rolls. Pizza. Cheese bread. Foods covered with batter, butter, creamy sauces, or cheese. Fried foods. Sugary drinks and desserts. Foods that cause gas or bloating   This information is not intended to replace advice given to you by your health care provider. Make sure you discuss any questions you have with your health care provider.   Document Released: 06/12/2013 Document Reviewed: 06/12/2013 Elsevier Interactive Patient Education [employer]

## 2023-08-21 ENCOUNTER — Other Ambulatory Visit: Payer: Self-pay | Admitting: Cardiovascular Disease

## 2023-08-21 DIAGNOSIS — E782 Mixed hyperlipidemia: Secondary | ICD-10-CM

## 2023-08-24 ENCOUNTER — Ambulatory Visit
Admission: RE | Admit: 2023-08-24 | Discharge: 2023-08-24 | Disposition: A | Discharge status: Home / Self Care | Source: Ambulatory Visit | Attending: Student in an Organized Health Care Education/Training Program | Admitting: Student in an Organized Health Care Education/Training Program

## 2023-08-24 ENCOUNTER — Ambulatory Visit
Payer: 59 | Attending: Student in an Organized Health Care Education/Training Program | Admitting: Student in an Organized Health Care Education/Training Program

## 2023-08-24 VITALS — BP 171/84 | HR 63 | Temp 97.0°F | Resp 17 | Ht 63.0 in | Wt 170.0 lb

## 2023-08-24 DIAGNOSIS — Z955 Presence of coronary angioplasty implant and graft: Secondary | ICD-10-CM | POA: Diagnosis not present

## 2023-08-24 DIAGNOSIS — G588 Other specified mononeuropathies: Secondary | ICD-10-CM

## 2023-08-24 DIAGNOSIS — M545 Low back pain, unspecified: Secondary | ICD-10-CM | POA: Diagnosis not present

## 2023-08-24 MED ORDER — ROPIVACAINE HCL 2 MG/ML IJ SOLN
18.0000 mL | Freq: Once | INTRAMUSCULAR | Status: AC
  Administered 2023-08-24: 18 mL via PERINEURAL
  Filled 2023-08-24: qty 20

## 2023-08-24 MED ORDER — LIDOCAINE HCL 2 % IJ SOLN
20.0000 mL | Freq: Once | INTRAMUSCULAR | Status: AC
Start: 2023-08-24 — End: 2023-08-24
  Administered 2023-08-24: 100 mg
  Filled 2023-08-24: qty 40

## 2023-08-24 MED ORDER — DEXAMETHASONE SODIUM PHOSPHATE 10 MG/ML IJ SOLN
20.0000 mg | Freq: Once | INTRAMUSCULAR | Status: AC
  Administered 2023-08-24: 20 mg
  Filled 2023-08-24: qty 2

## 2023-08-24 NOTE — Progress Notes (Signed)
 Safety precautions to be maintained throughout the outpatient stay will include: orient to surroundings, keep bed in low position, maintain call bell within reach at all times, provide assistance with transfer out of bed and ambulation.

## 2023-08-24 NOTE — Patient Instructions (Signed)

## 2023-08-24 NOTE — Progress Notes (Signed)
 PROVIDER NOTE: Interpretation of information contained herein should be left to medically-trained personnel. Specific patient instructions are provided elsewhere under "Patient Instructions" section of medical record. This document was created in part using STT-dictation technology, any transcriptional errors that may result from this process are unintentional.  Patient: [name] Type: Established DOB: 1945/10/07 MRN: [id number] PCP: Margaretann Loveless, MD  Service: Procedure DOS: 08/24/2023 Setting: Ambulatory Location: Ambulatory outpatient facility Delivery: Face-to-face Provider: Edward Jolly, MD Specialty: Interventional Pain Management Specialty designation: 09 Location: Outpatient facility Ref. Prov.: Margaretann Loveless, MD       Interventional Therapy   Primary Reason for Visit: Interventional Pain Management Treatment. CC: Back Pain (lower)    Procedure:          Anesthesia, Analgesia, Anxiolysis:  Type: Superior cluneal nerve block Region: Posterior lumbosacral Region overlying iliac crest Laterality: Bilateral  Anesthesia: Local (1-2% Lidocaine)  Guidance: Fluoroscopy           Position: Prone   1. 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 is a 78 y.o. (year old), female patient, seen today for interventional treatment. She  has a past surgical history that includes Cataract extraction; Cervical fusion (N/A); Cardiac catheterization (Left, 02/23/2007); Aneurysm coiling (08/2013); Back surgery; Bartholin cyst marsupialization; Upper esophageal endoscopic ultrasound (eus) (N/A, 04/15/2016); Breast cyst aspiration (Right, 01/25/2012); Colonoscopy with propofol (N/A, 10/04/2019); polypectomy (N/A, 10/04/2019); Breast excisional biopsy (Left, 06/26/2007); Bunionectomy  (Bilateral, 2022); CORONARY STENT INTERVENTION (Left, 02/27/2007); Cardiac catheterization (Left, 07/31/2007); Cardiac catheterization (Left, 10/28/2010); Cardiac catheterization (Left, 05/31/2013); Carotid arteriogram; 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 (N/A, 07/18/2006); 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 Ms. Spanbauer has a current medication list which includes the following prescription(s): allopurinol, amoxicillin-clavulanate, clotrimazole-betamethasone, fluticasone, furosemide, gabapentin, glimepiride, hydralazine, hydroxychloroquine, labetalol, losartan, methocarbamol, nitroglycerin, pantoprazole, rosuvastatin, senna, symbicort, tramadol, tretinoin, tretinoin, and zolpidem. Her primarily concern today is the Back Pain (lower)  Initial Vital Signs:  Pulse/HCG Rate: 63ECG Heart Rate: 65 Temp: (!) 97 F (36.1 C) Resp: 16 BP: (!) 180/79 SpO2: 98 %  BMI: Estimated body mass index is 30.11 kg/m as calculated from the following:   Height as of this encounter: 5\' 3"  (1.6 m).   Weight as of this encounter: 170 lb (77.1 kg).  Risk Assessment: Allergies: Reviewed. She is allergic to isosorbide dinitrate, amlodipine, sulfa antibiotics, tizanidine hcl, oxycodone, and sulfasalazine.  Allergy Precautions: None required Coagulopathies: Reviewed. None identified.  Blood-thinner therapy: None at this  time Active Infection(s): Reviewed. None identified. [name] is afebrile  Site Confirmation: [name] was asked to confirm the procedure and laterality before marking the site Procedure checklist: Completed Consent: Before the procedure and under the influence of no sedative(s), amnesic(s), or anxiolytics, the patient was informed of the treatment options, risks and possible complications. To fulfill our ethical and legal obligations, as recommended by the American Medical Association's Code of Ethics, I  have informed the patient of my clinical impression; the nature and purpose of the treatment or procedure; the risks, benefits, and possible complications of the intervention; the alternatives, including doing nothing; the risk(s) and benefit(s) of the alternative treatment(s) or procedure(s); and the risk(s) and benefit(s) of doing nothing. The patient was provided information about the general risks and possible complications associated with the procedure. These may include, but are not limited to: failure to achieve desired goals, infection, bleeding, organ or nerve damage, allergic reactions, paralysis, and death. In addition, the patient was informed of those risks and complications associated to Spine-related procedures, such as failure to decrease pain; infection (i.e.: Meningitis, epidural or intraspinal abscess); bleeding (i.e.: epidural hematoma, subarachnoid hemorrhage, or any other type of intraspinal or peri-dural bleeding); organ or nerve damage (i.e.: Any type of peripheral nerve, nerve root, or spinal cord injury) with subsequent damage to sensory, motor, and/or autonomic systems, resulting in permanent pain, numbness, and/or weakness of one or several areas of the body; allergic reactions; (i.e.: anaphylactic reaction); and/or death. Furthermore, the patient was informed of those risks and complications associated with the medications. These include, but are not limited to:  allergic reactions (i.e.: anaphylactic or anaphylactoid reaction(s)); adrenal axis suppression; blood sugar elevation that in diabetics may result in ketoacidosis or comma; water retention that in patients with history of congestive heart failure may result in shortness of breath, pulmonary edema, and decompensation with resultant heart failure; weight gain; swelling or edema; medication-induced neural toxicity; particulate matter embolism and blood vessel occlusion with resultant organ, and/or nervous system infarction; and/or aseptic necrosis of one or more joints. Finally, the patient was informed that Medicine is not an exact science; therefore, there is also the possibility of unforeseen or unpredictable risks and/or possible complications that may result in a catastrophic outcome. The patient indicated having understood very clearly. We have given the patient no guarantees and we have made no promises. Enough time was given to the patient to ask questions, all of which were answered to the patient's satisfaction. Ms. Lasker has indicated that she wanted to continue with the procedure. Attestation: I, the ordering provider, attest that I have discussed with the patient the benefits, risks, side-effects, alternatives, likelihood of achieving goals, and potential problems during recovery for the procedure that I have provided informed consent. Date  Time: 08/24/2023  1:15 PM   Pre-Procedure Preparation:  Monitoring: As per clinic protocol. Respiration, ETCO2, SpO2, BP, heart rate and rhythm monitor placed and checked for adequate function Safety Precautions: Patient was assessed for positional comfort and pressure points before starting the procedure. Time-out: I initiated and conducted the "Time-out" before starting the procedure, as per protocol. The patient was asked to participate by confirming the accuracy of the "Time Out" information. Verification of the correct person, site, and procedure were  performed and confirmed by me, the nursing staff, and the patient. "Time-out" conducted as per Joint Commission's Universal Protocol (UP.01.01.01). Time: 1343 Start Time: 1343 hrs.  Description of Procedure:          Patient Positioning & Preparation: The patient was placed in a prone position on the fluoroscopy table. The lower back and gluteal region were prepped with chlorhexidine  Fluoroscopic Technique: The fluoroscope was positioned to obtain an optimal anteroposterior (AP) view of the posterior iliac crest. The superior cluneal nerve (SCN) course was identified along the posterior iliac crest, approximately 7-8 cm lateral to the midline, where the SCN emerges from the thoracolumbar fascia.  Needle Placement: Under  intermittent fluoroscopic guidance, a 22g 3.5 inch spinal needle was advanced toward the posterior iliac crest at the level of the superior cluneal nerve emergence points. The needle was positioned in the subcutaneous to fascial plane, ensuring no intravascular placement by aspirating before injection.  Injection: After negative aspiration, 10 cc solution made of 8 cc of 0.2% ropivacaine, 2 cc of Decadron 10 mg/cc, of which 5 cc was injected per side in small aliquots.  Findings & Tolerance: The patient tolerated the procedure well with no immediate complications. No evidence of vascular uptake or systemic toxicity was observed. Vitals:   08/24/23 1320 08/24/23 1340 08/24/23 1345 08/24/23 1351  BP: (!) 180/79 (!) 178/86 (!) 183/86 (!) 171/84  Pulse: 63     Resp: 16 18 17 17   Temp: (!) 97 F (36.1 C)     SpO2: 98% 98% 98% 97%  Weight: 170 lb (77.1 kg)     Height: 5\' 3"  (1.6 m)       Start Time: 1343 hrs. End Time: 1351 hrs. Materials:  Needle(s) Type: Spinal Needle Gauge: 22G Length: 3.5-in   Imaging Guidance (Spinal):          Type of Imaging Technique: Fluoroscopy Guidance (Spinal) Indication(s): Fluoroscopy guidance for needle placement to enhance accuracy  in procedures requiring precise needle localization for targeted delivery of medication in or near specific anatomical locations not easily accessible without such real-time imaging assistance. Exposure Time: Please see nurses notes. Contrast: None used. Fluoroscopic Guidance: I was personally present during the use of fluoroscopy. "Tunnel Vision Technique" used to obtain the best possible view of the target area. Parallax error corrected before commencing the procedure. "Direction-depth-direction" technique used to introduce the needle under continuous pulsed fluoroscopy. Once target was reached, antero-posterior, oblique, and lateral fluoroscopic projection used confirm needle placement in all planes. Images permanently stored in EMR. Interpretation: No contrast injected. I personally interpreted the imaging intraoperatively. Adequate needle placement confirmed in multiple planes. Permanent images saved into the patient's record.  Antibiotic Prophylaxis:   Anti-infectives (From admission, onward)    None      Indication(s): None identified  Post-operative Assessment:  Post-procedure Vital Signs:  Pulse/HCG Rate: 6363 Temp: (!) 97 F (36.1 C) Resp: 17 BP: (!) 171/84 SpO2: 97 %  EBL: None  Complications: No immediate post-treatment complications observed by team, or reported by patient.  Note: The patient tolerated the entire procedure well. A repeat set of vitals were taken after the procedure and the patient was kept under observation following institutional policy, for this type of procedure. Post-procedural neurological assessment was performed, showing return to baseline, prior to discharge. The patient was provided with post-procedure discharge instructions, including a section on how to identify potential problems. Should any problems arise concerning this procedure, the patient was given instructions to immediately contact us, at any time, without hesitation. In any case, we plan  to contact the patient by telephone for a follow-up status report regarding this interventional procedure.  Comments:  No additional relevant information.  Plan of Care (POC)  Orders:  Orders Placed This Encounter  Procedures   DG PAIN CLINIC C-ARM 1-60 MIN NO REPORT    Intraoperative interpretation by procedural physician at [facility].    Standing Status:   Standing    Number of Occurrences:   1    Reason for exam::   Assistance in needle guidance and placement for procedures requiring needle placement in or near specific anatomical locations not easily accessible without such assistance.  Medications ordered for procedure: Meds ordered this encounter  Medications   lidocaine (XYLOCAINE) 2 % (with pres) injection 400 mg   dexamethasone (DECADRON) injection 20 mg   ropivacaine (PF) 2 mg/mL (0.2%) (NAROPIN) injection 18 mL   Medications administered: We administered lidocaine, dexamethasone, and ropivacaine (PF) 2 mg/mL (0.2%).  See the medical record for exact dosing, route, and time of administration.  Follow-up plan:   Return in about 29 days (around 09/22/2023) for f38f ppe.       B/L Cluneal NB 08/24/23    Recent Visits Date Type Provider Dept  08/04/23 Office Visit Edward Jolly, MD [facility]  Showing recent visits within past 90 days and meeting all other requirements Today's Visits Date Type Provider Dept  08/24/23 Procedure visit Edward Jolly, MD [facility]  Showing today's visits and meeting all other requirements Future Appointments Date Type Provider Dept  09/26/23 Appointment Edward Jolly, MD [facility]  Showing future appointments within next 90 days and meeting all other requirements  Disposition: Discharge home  Discharge (Date  Time): 08/24/2023; 1400 hrs.   Primary Care Physician: Margaretann Loveless, MD Location: [facility] Note by: Edward Jolly, MD (TTS technology used. I  apologize for any typographical errors that were not detected and corrected.) Date: 08/24/2023; Time: 2:41 PM  Disclaimer:  Medicine is not an Visual merchandiser. The only guarantee in medicine is that nothing is guaranteed. It is important to note that the decision to proceed with this intervention was based on the information collected from the patient. The Data and conclusions were drawn from the patient's questionnaire, the interview, and the physical examination. Because the information was provided in large part by the patient, it cannot be guaranteed that it has not been purposely or unconsciously manipulated. Every effort has been made to obtain as much relevant data as possible for this evaluation. It is important to note that the conclusions that lead to this procedure are derived in large part from the available data. Always take into account that the treatment will also be dependent on availability of resources and existing treatment guidelines, considered by other Pain Management Practitioners as being common knowledge and practice, at the time of the intervention. For Medico-Legal purposes, it is also important to point out that variation in procedural techniques and pharmacological choices are the acceptable norm. The indications, contraindications, technique, and results of the above procedure should only be interpreted and judged by a Board-Certified Interventional Pain Specialist with extensive familiarity and expertise in the same exact procedure and technique.

## 2023-08-25 ENCOUNTER — Other Ambulatory Visit: Payer: Self-pay | Admitting: Internal Medicine

## 2023-08-25 ENCOUNTER — Telehealth: Payer: Self-pay

## 2023-08-25 DIAGNOSIS — F5101 Primary insomnia: Secondary | ICD-10-CM

## 2023-08-25 NOTE — Telephone Encounter (Signed)
 No issues post-procedure.

## 2023-08-27 ENCOUNTER — Other Ambulatory Visit: Payer: Self-pay | Admitting: Family

## 2023-08-29 ENCOUNTER — Other Ambulatory Visit: Payer: Self-pay | Admitting: Internal Medicine

## 2023-08-29 DIAGNOSIS — E119 Type 2 diabetes mellitus without complications: Secondary | ICD-10-CM

## 2023-09-05 IMAGING — US US PELVIS COMPLETE WITH TRANSVAGINAL
1 series · 13 of 25 positions shown · non-contrast
Comparison: None

CLINICAL DATA: Post menopausal bleeding

EXAM:
TRANSABDOMINAL AND TRANSVAGINAL ULTRASOUND OF PELVIS
TECHNIQUE: Both transabdominal and transvaginal ultrasound examinations of the
pelvis were performed. Transabdominal technique was performed for
global imaging of the pelvis including uterus, ovaries, adnexal
regions, and pelvic cul-de-sac. It was necessary to proceed with
endovaginal exam following the transabdominal exam to visualize the
uterus endometrium ovaries.

[Series 1: us pelvic complete with transvaginal · 48 acquisitions, 13 frames shown]
[im 1/48]
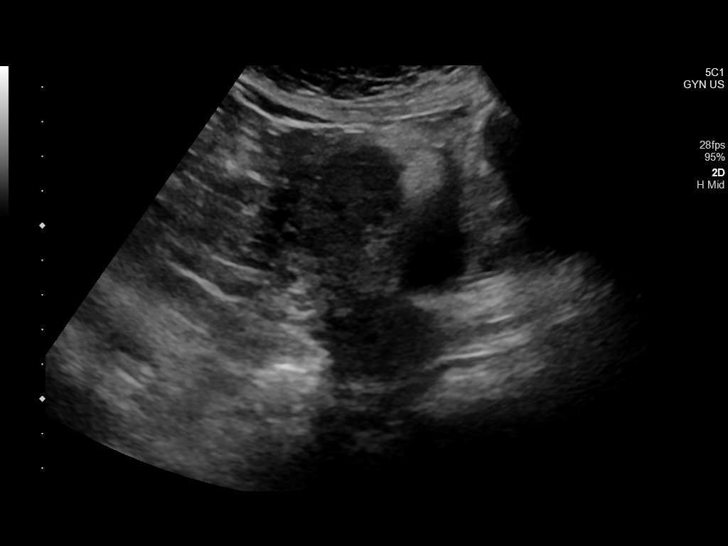
[im 4/48]
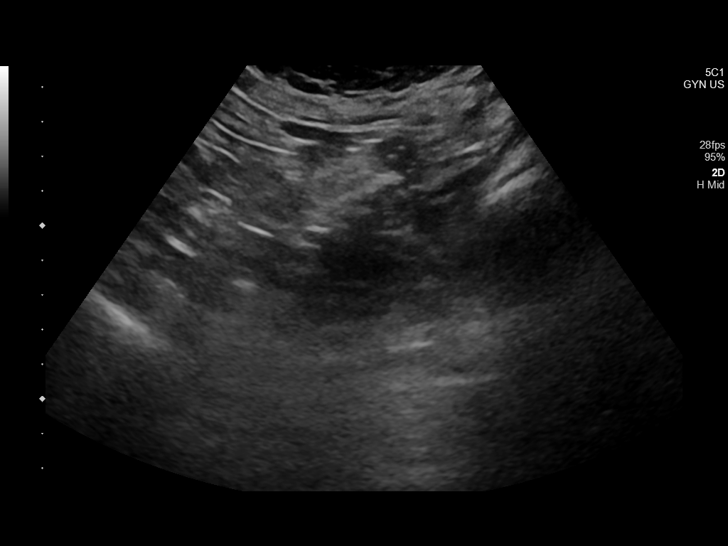
[im 8/48]
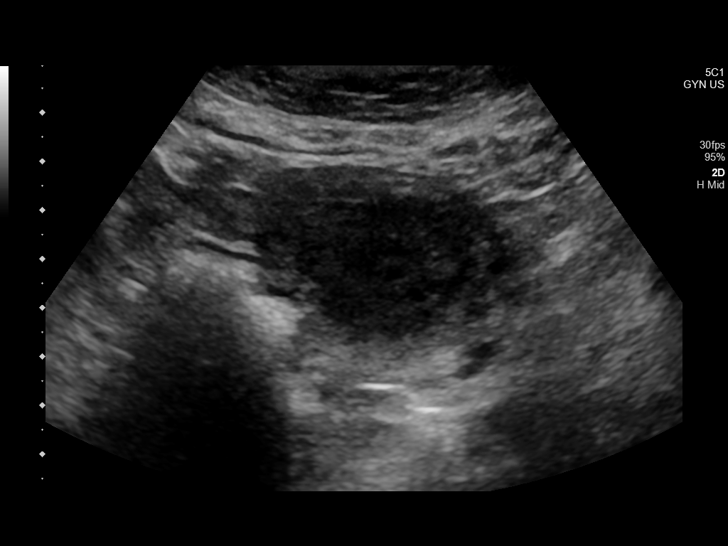
[im 12/48]
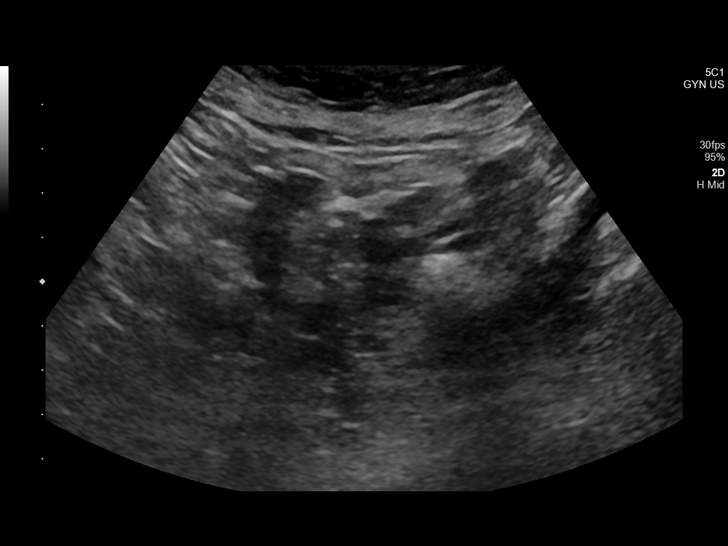
[im 16/48]
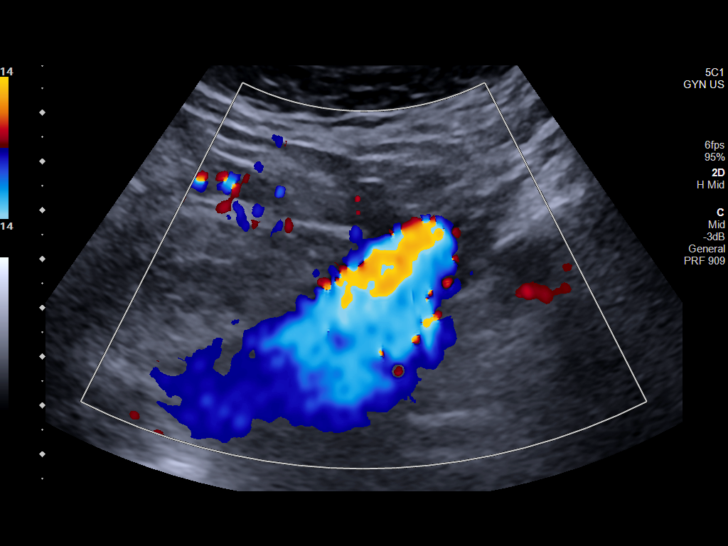
[im 20/48]
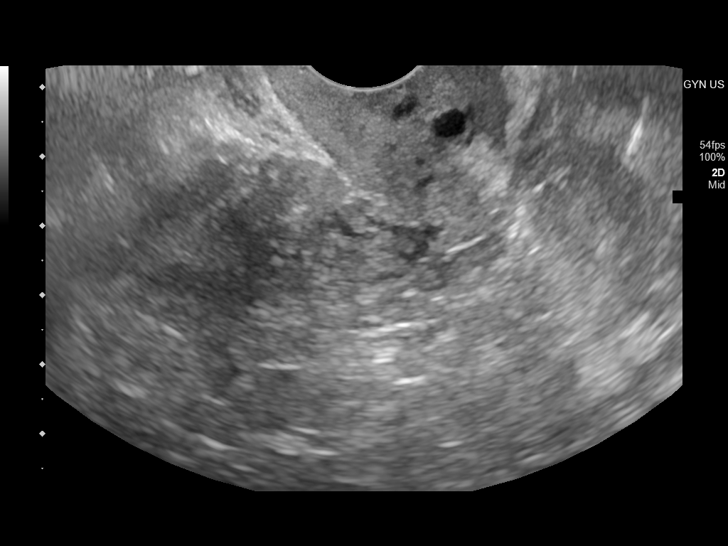
[im 24/48]
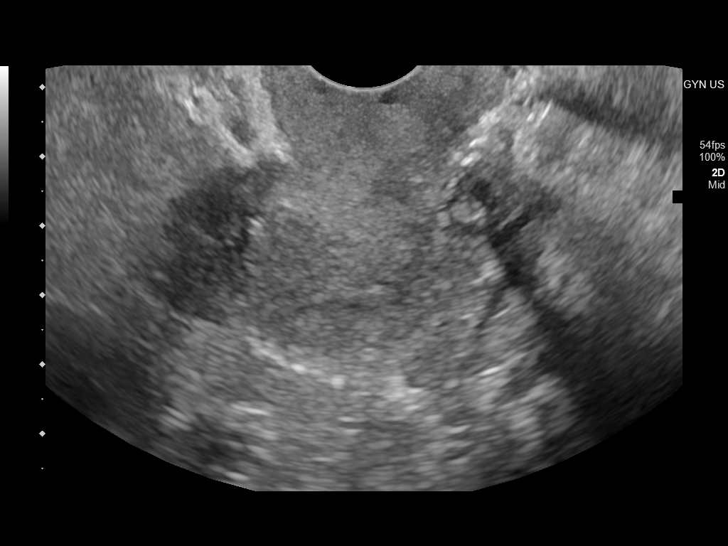
[im 28/48]
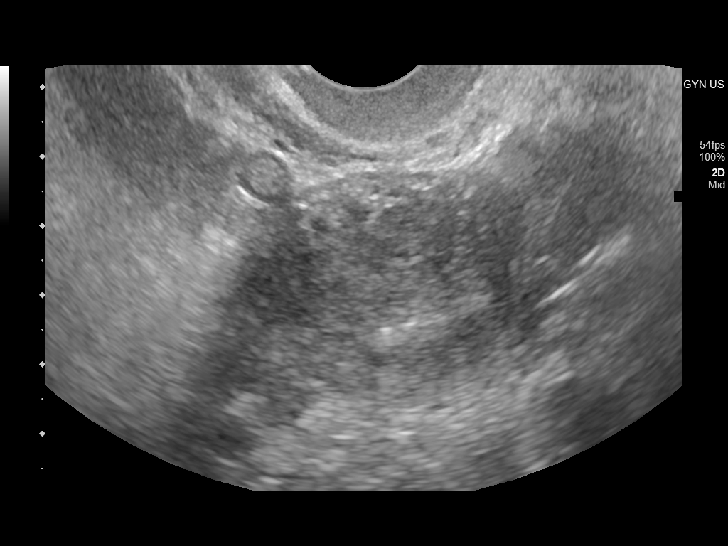
[im 32/48]
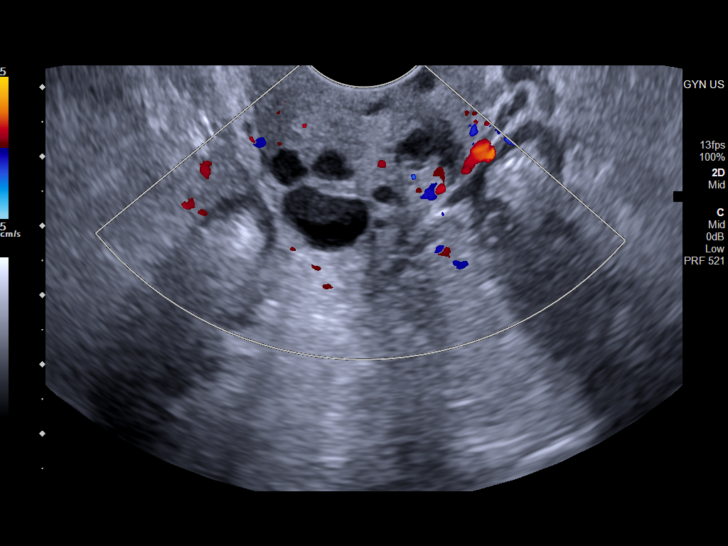
[im 36/48]
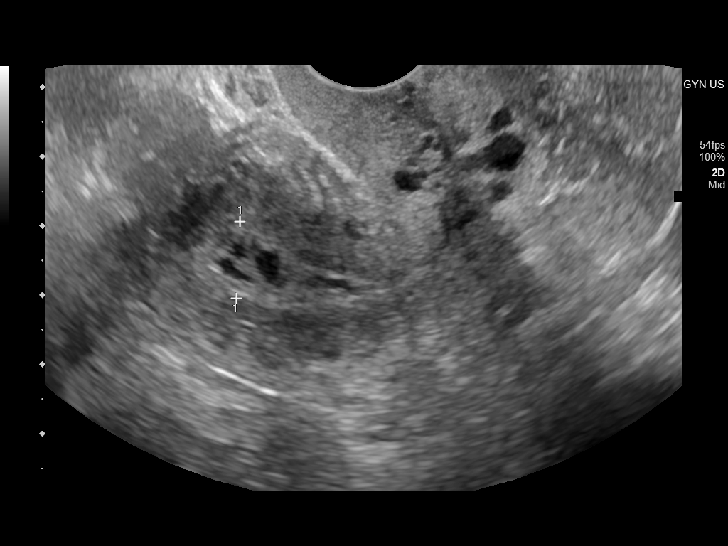
[im 40/48]
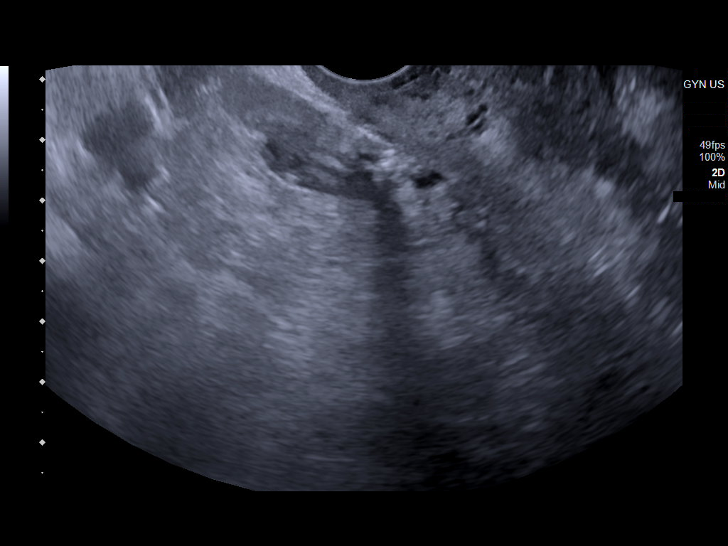
[im 44/48]
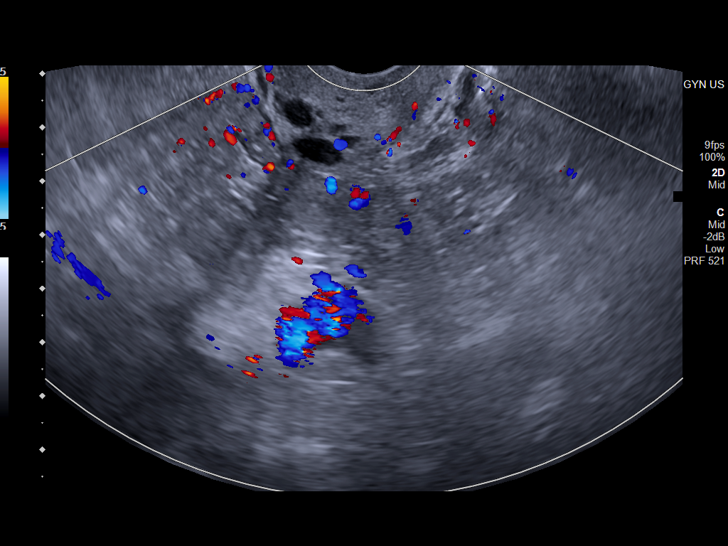
[im 48/48]
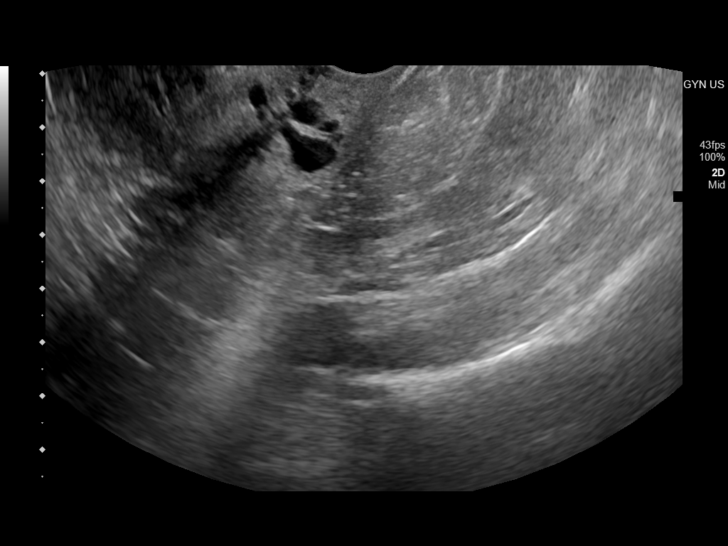

[13 of 25 positions shown; findings below may reference images not displayed]

FINDINGS: Uterus

Measurements: 5.5 x 3.8 x 5.1 cm = volume: 56 mL. Nabothian cysts in
the cervix.

Endometrium

Thickness: 11 mm.  Thickened endometrium with multiple cysts.

Right ovary

Not seen

Left ovary

Not seen

Other findings

No abnormal free fluid.
IMPRESSION: 1. Endometrial thickness of 11 mm. In the setting of post-menopausal
bleeding, endometrial sampling is indicated to exclude carcinoma. If
results are benign, sonohysterogram should be considered for focal
lesion work-up. (Ref: Radiological Reasoning: Algorithmic Workup of
Abnormal Vaginal Bleeding with Endovaginal Sonography and
Sonohysterography. AJR 9337; 191:S68-73)
2. Nonvisualized ovaries.

## 2023-09-07 ENCOUNTER — Ambulatory Visit: Payer: Self-pay

## 2023-09-07 NOTE — Patient Instructions (Signed)
 Visit Information  Thank you for taking time to visit with me today. Please don't hesitate to contact me if I can be of assistance to you.   Following are the goals we discussed today:   Goals Addressed             This Visit's Progress    Patient Stated:  " I want to manage my arthritis pain and health conditions"       Interventions Today    Flowsheet Row Most Recent Value  Chronic Disease   Chronic disease during today's visit Other  [status post lumbar laminectomy, ongoing back/ buttock pain), UTI, chronic calculous cholecystitis, CKD IV]  General Interventions   General Interventions Discussed/Reviewed General Interventions Reviewed, Doctor Visits  [evaluation of current treatment plan for listed health conditions and patients adherence to plan as established by provider.  Assessed pain level, assessed for ongoing UTI symptoms]  Doctor Visits Discussed/Reviewed Doctor Visits Reviewed  Annabell Sabal upcoming provider visits. Advised to keep follow up visits with providers as recommended.]  Exercise Interventions   Exercise Discussed/Reviewed Physical Activity  [Assessed patients current activity level.]  Education Interventions   Education Provided Provided Education  [Advised to avoid NSAID to maintain kidney health. Advised to stay hydrated Advised to notify provider for increase in UTI symptoms or symptoms related to gallstones. Patient informed she will be transfered to new [facility]  case manager]  Provided Verbal Education On Other  [Discussed non pharmacologic pain management treatments.]  Pharmacy Interventions   Pharmacy Dicussed/Reviewed Pharmacy Topics Reviewed  [Medications reviewed and compliance discussed and recommended.]              Our next appointment is by telephone on 10/11/23 at 3 pm with RN case manager, Gae Dry.   Please call the care guide team at [phone] if you need to cancel or reschedule your appointment.   If you are experiencing a Mental  Health or Behavioral Health Crisis or need someone to talk to, please call the Suicide and Crisis Lifeline: 988 call 1-800-273-TALK (toll free, 24 hour hotline)  Patient verbalizes understanding of instructions and care plan provided today and agrees to view in MyChart. Active MyChart status and patient understanding of how to access instructions and care plan via MyChart confirmed with patient.     George Ina RN, BSN, CCM [employer], Population Health Case Manager Phone: [phone]

## 2023-09-07 NOTE — Patient Outreach (Signed)
 Care Coordination   Follow Up Visit Note   09/07/2023 Name: [name] MRN: [id number] DOB: [birth year]  [name] Headings is a 77 y.o. year old female who sees Cathy Loveless, MD for primary care. I spoke with  [name] by phone today.  What matters to the patients health and wellness today?  Patient reports ongoing right back/hip/ buttock pain. She reports pain level today is 5-6. Patient states she is unable to stand long or walk long distances. She states she is able to sit and lay down without pain.  She states her sleep is not affected by the pain.  Patient reports seeing her pain management provider and having several steroid injections. She states the injections did not provider her any relief and she states ice / heat does not help.  Patient states her UTI symptoms have resolved after completed antibiotic treatment. Patient denies gallstone symptoms.  Patient scheduled with new [facility] case manager, [name].    Goals Addressed             This Visit's Progress    Patient Stated:  " I want to manage my arthritis pain and health conditions"       Interventions Today    Flowsheet Row Most Recent Value  Chronic Disease   Chronic disease during today's visit Other  [status post lumbar laminectomy, ongoing back/ buttock pain), UTI, chronic calculous cholecystitis, CKD IV]  General Interventions   General Interventions Discussed/Reviewed General Interventions Reviewed, Doctor Visits  [evaluation of current treatment plan for listed health conditions and patients adherence to plan as established by provider.  Assessed pain level, assessed for ongoing UTI symptoms]  Doctor Visits Discussed/Reviewed Doctor Visits Reviewed  [name] upcoming provider visits. Advised to keep follow up visits with providers as recommended.]  Exercise Interventions   Exercise Discussed/Reviewed Physical Activity  [Assessed patients current activity level.]  Education Interventions    Education Provided Provided Education  [Advised to avoid NSAID to maintain kidney health. Advised to stay hydrated Advised to notify provider for increase in UTI symptoms or symptoms related to gallstones. Patient informed she will be transfered to new [facility]  case manager]  Provided Verbal Education On Other  [Discussed non pharmacologic pain management treatments.]  Pharmacy Interventions   Pharmacy Dicussed/Reviewed Pharmacy Topics Reviewed  [Medications reviewed and compliance discussed and recommended.]              SDOH assessments and interventions completed:  No     Care Coordination Interventions:  Yes, provided   Follow up plan: Follow up call scheduled for 10/11/23 at 3:00 pm    Encounter Outcome:  Patient Visit Completed   Cathy Ina RN, BSN, CCM [facility], Population Health Case Manager Phone: [phone]

## 2023-09-22 ENCOUNTER — Other Ambulatory Visit: Payer: Self-pay | Admitting: Internal Medicine

## 2023-09-22 DIAGNOSIS — F5101 Primary insomnia: Secondary | ICD-10-CM

## 2023-09-26 ENCOUNTER — Ambulatory Visit
Attending: Student in an Organized Health Care Education/Training Program | Admitting: Student in an Organized Health Care Education/Training Program

## 2023-09-26 ENCOUNTER — Encounter: Payer: Self-pay | Admitting: Student in an Organized Health Care Education/Training Program

## 2023-09-26 VITALS — BP 159/67 | HR 72 | Temp 97.4°F | Ht 64.0 in | Wt 178.0 lb

## 2023-09-26 DIAGNOSIS — G5703 Lesion of sciatic nerve, bilateral lower limbs: Secondary | ICD-10-CM | POA: Diagnosis not present

## 2023-09-26 DIAGNOSIS — M533 Sacrococcygeal disorders, not elsewhere classified: Secondary | ICD-10-CM | POA: Insufficient documentation

## 2023-09-26 DIAGNOSIS — M461 Sacroiliitis, not elsewhere classified: Secondary | ICD-10-CM | POA: Diagnosis not present

## 2023-09-26 MED ORDER — HYDROCODONE-ACETAMINOPHEN 5-325 MG PO TABS: 1.0000 | ORAL_TABLET | Freq: Three times a day (TID) | ORAL | 0 refills | Status: AC | PRN

## 2023-09-26 MED ORDER — GABAPENTIN 100 MG PO CAPS: 100.0000 mg | ORAL_CAPSULE | Freq: Every day | ORAL | 0 refills | Status: AC

## 2023-09-26 NOTE — Progress Notes (Signed)
 Safety precautions to be maintained throughout the outpatient stay will include: orient to surroundings, keep bed in low position, maintain call bell within reach at all times, provide assistance with transfer out of bed and ambulation.

## 2023-09-26 NOTE — Progress Notes (Signed)
 PROVIDER NOTE: Interpretation of information contained herein should be left to medically-trained personnel. Specific patient instructions are provided elsewhere under "Patient Instructions" section of medical record. This document was created in part using AI and STT-dictation technology, any transcriptional errors that may result from this process are unintentional.  Patient: [name]  Service: E/M   PCP: Margaretann Loveless, MD  DOB: 1946-04-19  DOS: 09/26/2023  Provider: Edward Jolly, MD  MRN: [id number]  Delivery: Face-to-face  Specialty: Interventional Pain Management  Type: Established Patient  Setting: Ambulatory outpatient facility  Specialty designation: 09  Referring Prov.: Margaretann Loveless, MD  Location: Outpatient office facility       HPI  Ms. AMBRA HAVERSTICK, a 78 y.o. year old female, is here today because of her Sacroiliac joint pain [M53.3]. Ms. Adamec primary complain today is Back Pain (lower)   Pain Assessment: Severity of Chronic pain is reported as a 8 /10. Location: Back Lower/radiates to both hips. Onset: More than a month ago. Quality: Constant. Timing: Constant. Modifying factor(s): denies. Vitals:  height is 5\' 4"  (1.626 m) and weight is 178 lb (80.7 kg). Her temperature is 97.4 F (36.3 C) (abnormal). Her blood pressure is 159/67 (abnormal) and her pulse is 72. Her oxygen saturation is 97%.  BMI: Estimated body mass index is 30.55 kg/m as calculated from the following:   Height as of this encounter: 5\' 4"  (1.626 m).   Weight as of this encounter: 178 lb (80.7 kg). Last encounter: 08/04/2023. Last procedure: 08/24/2023.  Reason for encounter:    Post-procedure evaluation     Procedure:          Anesthesia, Analgesia, Anxiolysis:  Type: Superior cluneal nerve block Region: Posterior lumbosacral Region overlying iliac crest Laterality: Bilateral  Anesthesia: Local (1-2% Lidocaine)  Guidance: Fluoroscopy           Position: Prone   1. Cluneal  neuropathy    Indication: Chronic lower back/gluteal pain secondary to superior cluneal nerve entrapment or neuropathy  NAS-11 Pain score:   Pre-procedure: 5 /10   Post-procedure: 5 /10    Effectiveness:  Initial hour after procedure: 10 %  Subsequent 4-6 hours post-procedure: 10 %  Analgesia past initial 6 hours: 0 %  Ongoing improvement:  Analgesic:  0%   Pharmacotherapy Assessment  Analgesic: Hydrocodone 5 mg TID PRN  Monitoring: [place] PMP: PDMP not reviewed this encounter.       Pharmacotherapy: No side-effects or adverse reactions reported. Compliance: No problems identified. Effectiveness: Clinically acceptable.  UDS:  Summary  Date Value Ref Range Status  08/04/2023 FINAL  Final    Comment:    ==================================================================== Compliance Drug Analysis, Ur ==================================================================== Test                             Result       Flag       Units  Drug Present and Declared for Prescription Verification   Gabapentin                     PRESENT      EXPECTED   Methocarbamol                  PRESENT      EXPECTED   Zolpidem                       PRESENT      EXPECTED  Zolpidem Acid                  PRESENT      EXPECTED    Zolpidem acid is an expected metabolite of zolpidem.  Drug Absent but Declared for Prescription Verification   Tramadol                       Not Detected UNEXPECTED ng/mg creat ==================================================================== Test                      Result    Flag   Units      Ref Range   Creatinine              187              mg/dL      >=26 ==================================================================== Declared Medications:  The flagging and interpretation on this report are based on the  following declared medications.  Unexpected results may arise from  inaccuracies in the declared medications.   **Note: The testing scope of this panel  includes these medications:   Gabapentin (Neurontin)  Methocarbamol (Robaxin)  Tramadol   **Note: The testing scope of this panel does not include small to  moderate amounts of these reported medications:   Zolpidem (Ambien)   **Note: The testing scope of this panel does not include the  following reported medications:   Allopurinol (Zyloprim)  Betamethasone (Lotrisone)  Budesonide (Symbicort)  Clotrimazole (Lotrisone)  Fluticasone (Flonase)  Formoterol (Symbicort)  Furosemide (Lasix)  Glimepiride (Amaryl)  Hydralazine (Apresoline)  Hydroxychloroquine (Plaquenil)  Losartan (Cozaar)  Nitroglycerin (Nitrostat)  Normodyne  Pantoprazole (Protonix)  Rosuvastatin (Crestor)  Sennosides (Senokot)  Sodium Zirconium Cyclosilicate (Lokelma)  Tretinoin (Retin-A) ==================================================================== For clinical consultation, please call [phone]. ====================================================================       ROS  Constitutional: Denies any fever or chills Gastrointestinal: No reported hemesis, hematochezia, vomiting, or acute GI distress Musculoskeletal:  Low back, buttock pain Neurological: No reported episodes of acute onset apraxia, aphasia, dysarthria, agnosia, amnesia, paralysis, loss of coordination, or loss of consciousness  Medication Review  HYDROcodone-acetaminophen, allopurinol, amoxicillin-clavulanate, budesonide-formoterol, clotrimazole-betamethasone, fluticasone, furosemide, gabapentin, glimepiride, hydrALAZINE, hydroxychloroquine, labetalol, losartan, methocarbamol, nitroGLYCERIN, pantoprazole, rosuvastatin, senna, traMADol, tretinoin, and zolpidem  History Review  Allergy: Ms. Heckman is allergic to isosorbide dinitrate, amlodipine, sulfa antibiotics, tizanidine hcl, oxycodone, and sulfasalazine. Drug: Ms. Schoon  reports no history of drug use. Alcohol:  reports current alcohol use. Tobacco:  reports that  she has never smoked. She has never been exposed to tobacco smoke. She has never used smokeless tobacco. Social: Ms. Garfield  reports that she has never smoked. She has never been exposed to tobacco smoke. She has never used smokeless tobacco. She reports current alcohol use. She reports that she does not use drugs. Medical:  has a past medical history of Anemia of chronic renal disease, Arthritis, Cerebral aneurysm (06/07/2006), CKD (chronic kidney disease), stage IV (HCC), Complication of anesthesia, Coronary artery disease (02/23/2007), DDD (degenerative disc disease), cervical, Diastolic dysfunction, Elevated LEFT hemidiaphragm, Gout, Headache(784.0), Heart murmur, HLD (hyperlipidemia), HOH (hard of hearing), Hyperkalemia, Hyperparathyroidism due to renal insufficiency (HCC), Hypertension, Incomplete right bundle branch block (RBBB), Insomnia, Long term (current) use of immunomodulator, Lumbar adjacent segment disease with spondylolisthesis, Multiple acquired cysts of kidney, On chronic clopidogrel therapy, OSA (obstructive sleep apnea), Osteoarthritis, Pancreatic cyst, Peripheral vascular disease (HCC), Pneumonia, Post-COVID chronic cough, Pulmonary nodule, RBBB (right bundle branch block), Rectal bleeding, Rheumatoid arthritis (HCC), Shortness of breath (03/01/2023),  Systemic lupus erythematosus (HCC), T2DM (type 2 diabetes mellitus) (HCC), Tendinitis of left wrist, Thickened endometrium, and Wears dentures. Surgical: Ms. Bostick  has a past surgical history that includes Cataract extraction; Cervical fusion (N/A); Cardiac catheterization (Left, 02/23/2007); Aneurysm coiling (08/2013); Back surgery; Bartholin cyst marsupialization; Upper esophageal endoscopic ultrasound (eus) (N/A, 04/15/2016); Breast cyst aspiration (Right, 01/25/2012); Colonoscopy with propofol (N/A, 10/04/2019); polypectomy (N/A, 10/04/2019); Breast excisional biopsy (Left, 06/26/2007); Bunionectomy (Bilateral, 2022); CORONARY STENT  INTERVENTION (Left, 02/27/2007); Cardiac catheterization (Left, 07/31/2007); Cardiac catheterization (Left, 10/28/2010); Cardiac catheterization (Left, 05/31/2013); Carotid arteriogram; 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 (N/A, 07/18/2006); 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 Family: family history includes Breast cancer in her paternal aunt; Breast cancer (age of onset: 13) in her paternal aunt; Diabetes in her brother and sister; Heart disease in her father and mother; Stroke in her father.  Laboratory Chemistry Profile   Renal Lab Results  Component Value Date   BUN 41 (H) 08/05/2023   CREATININE 2.58 (H) 08/05/2023   BCR 16 08/05/2023   GFRAA 27 (L) 09/11/2019   GFRNONAA 21 (L) 03/13/2023    Hepatic Lab Results  Component Value Date   AST 13 08/05/2023   ALT 6 08/05/2023   ALBUMIN 3.9 08/05/2023   ALKPHOS 81 08/05/2023   LIPASE 35 03/13/2023    Electrolytes Lab Results  Component Value Date   NA 148 (H) 08/05/2023   K 5.2 08/05/2023   CL 113 (H) 08/05/2023   CALCIUM 9.6 08/05/2023   MG 1.9 05/31/2013    Bone No results found for: "VD25OH", "VD125OH2TOT", "MW1027OZ3", "GU4403KV4", "25OHVITD1", "25OHVITD2", "25OHVITD3", "TESTOFREE", "TESTOSTERONE"  Inflammation (CRP: Acute Phase) (ESR: Chronic Phase) No results found for: "CRP", "ESRSEDRATE", "LATICACIDVEN"       Note: Above Lab  results reviewed.   Physical Exam  General appearance: Well nourished, well developed, and well hydrated. In no apparent acute distress Mental status: Alert, oriented x 3 (person, place, & time)       Respiratory: No evidence of acute respiratory distress Eyes: PERLA Vitals: BP (!) 159/67   Pulse 72   Temp (!) 97.4 F (36.3 C)   Ht 5\' 4"  (1.626 m)   Wt 178 lb (80.7 kg)   SpO2 97%   BMI 30.55 kg/m  BMI: Estimated body mass index is 30.55 kg/m as calculated from the following:   Height as of this encounter: 5\' 4"  (1.626 m).   Weight as of this encounter: 178 lb (80.7 kg). Ideal: Ideal body weight: 54.7 kg (120 lb 9.5 oz) Adjusted ideal body weight: 65.1 kg (143 lb 8.9 oz)  Lumbar Spine Area Exam  Skin & Axial Inspection: Well healed scar from previous spine surgery detected Alignment: Symmetrical Functional ROM: Pain restricted ROM       Stability: No instability detected Muscle Tone/Strength: Functionally intact. No obvious neuro-muscular anomalies detected. Sensory (Neurological): Referred pain pattern Palpation: No palpable anomalies       Provocative Tests: Hyperextension/rotation test: deferred today       Lumbar quadrant test (Kemp's test): deferred today       Lateral  bending test: deferred today       Patrick's Maneuver: (+) for bilateral S-I arthralgia             FABER* test: (+) for bilateral S-I arthralgia             S-I anterior distraction/compression test: (+) for bilateral S-I arthralgia             S-I lateral compression test: (+) for bilateral S-I arthralgia             S-I Thigh-thrust test: (+) for bilateral S-I arthralgia             S-I Gaenslen's test: (+) for bilateral S-I arthralgia             *(Flexion, ABduction and External Rotation) Gait & Posture Assessment  Ambulation: Limited Gait: Antalgic gait (limping) Posture: WNL  Lower Extremity Exam    Side: Right lower extremity  Side: Left lower extremity  Stability: No instability observed           Stability: No instability observed          Skin & Extremity Inspection: Skin color, temperature, and hair growth are WNL. No peripheral edema or cyanosis. No masses, redness, swelling, asymmetry, or associated skin lesions. No contractures.  Skin & Extremity Inspection: Skin color, temperature, and hair growth are WNL. No peripheral edema or cyanosis. No masses, redness, swelling, asymmetry, or associated skin lesions. No contractures.  Functional ROM: Unrestricted ROM                  Functional ROM: Unrestricted ROM                  Muscle Tone/Strength: Functionally intact. No obvious neuro-muscular anomalies detected.  Muscle Tone/Strength: Functionally intact. No obvious neuro-muscular anomalies detected.  Sensory (Neurological): Unimpaired        Sensory (Neurological): Unimpaired        DTR: Patellar: deferred today Achilles: deferred today Plantar: deferred today  DTR: Patellar: deferred today Achilles: deferred today Plantar: deferred today  Palpation: No palpable anomalies  Palpation: No palpable anomalies    Assessment   Diagnosis Status  1. Sacroiliac joint pain   2. SI joint arthritis (HCC)   3. Piriformis syndrome of both sides    Controlled Controlled Controlled   Updated Problems: Problem  Sacroiliac Joint Pain  Si Joint Arthritis (Hcc)  Piriformis Syndrome of Both Sides    Plan of Care   The patient presents with persistent lower back and buttock pain in the setting of prior L2-L5 spinal fusion. Pain is localized primarily to the lower lumbar region and buttocks. A fluoroscopy-guided cluneal nerve block was previously performed to evaluate for cluneal neuralgia, but the patient did not experience significant or lasting relief, making cluneal nerve-mediated pain less likely.  Given the patient's surgical history and pain distribution, other potential pain generators include:  Sacroiliac (SI) joint dysfunction - due to altered biomechanics and stress  transfer post-fusion  Piriformis syndrome or deep gluteal pain syndrome - possibly due to compensatory pelvic mechanics or myofascial irritation  Physical exam findings consistent with SI joint dysfunction  Plan: Proceed with diagnostic right and left SI joint injection under fluoroscopic guidance to assess for sacroiliac joint-mediated pain. This will help determine whether the SI joint is a significant pain contributor.  Schedule diagnostic piriformis injection under fluoroscopy at the same time to evaluate for piriformis syndrome as a source of buttock pain.  Medication management as below.  Patient finds gabapentin 300 mg nightly too strong and results in sedation and drowsiness.  Recommend that she decrease her dose to 100 to 200 mg nightly.  She also does not find much benefit with tramadol which she has received in the past.  Trial of hydrocodone as below 5 mg 3 times daily as needed            Ms. LINSY EHRESMAN has a current medication list which includes the following long-term medication(s): allopurinol, fluticasone, furosemide, gabapentin, glimepiride, hydralazine, labetalol, losartan, nitroglycerin, pantoprazole, rosuvastatin, symbicort, and zolpidem.  Pharmacotherapy (Medications Ordered): Meds ordered this encounter  Medications   gabapentin (NEURONTIN) 100 MG capsule    Sig: Take 1-3 capsules (100-300 mg total) by mouth at bedtime.    Dispense:  135 capsule    Refill:  0   HYDROcodone-acetaminophen (NORCO/VICODIN) 5-325 MG tablet    Sig: Take 1 tablet by mouth 3 (three) times daily as needed for severe pain (pain score 7-10). Must last 30 days    Dispense:  90 tablet    Refill:  0    Chronic Pain: STOP Act (Not applicable) Fill 1 day early if closed on refill date. Avoid benzodiazepines within 8 hours of opioids   Orders:  Orders Placed This Encounter  Procedures   SACROILIAC JOINT INJECTION    Physical Examination Findings: Positive Sacral Thrust ([place],  Downward Pressure): (Y) Positive FABER maneuver (Patrick's): (Y) Positive SI distraction ([place]): (Y) Positive SI compression (Approximation): (Y) Positive Thigh Thrust:  (Y) Positive Gaenslen's: (Y) Positive Sacral Sulcus Tenderness: (Y)    Standing Status:   Future    Expected Date:   10/19/2023    Expiration Date:   12/26/2023    Scheduling Instructions:     Procedure: Sacroiliac Joint Injection & Piriformis TPI     Side  Laterality: Bilateral     Sedation: Patient's choice.     Timeframe: As soon as schedule allows.    Where will this procedure be performed?:   [facility]   TRIGGER POINT INJECTION    Area: Buttocks region (gluteal area) Indications: Piriformis muscle pain;  b/l  piriformis-syndrome; piriformis muscle spasms (Z61.096). CPT code: 04540    Scheduling Instructions:     Type: Myoneural block (TPI) of piriformis muscle.     Side:  B/L    Where will this procedure be performed?:   [facility]   Follow-up plan:   Return in about 16 days (around 10/12/2023) for B/L SIJ and Piriformis TPI, in clinic [facility].     B/L Cluneal NB 08/24/23    Recent Visits Date Type Provider Dept  08/24/23 Procedure visit Edward Jolly, MD [facility]  08/04/23 Office Visit Edward Jolly, MD [facility]  Showing recent visits within past 90 days and meeting all other requirements Today's Visits Date Type Provider Dept  09/26/23 Office Visit Edward Jolly, MD [facility]  Showing today's visits and meeting all other requirements Future Appointments Date Type Provider Dept  10/12/23 Appointment Edward Jolly, MD [facility]  Showing future appointments within next 90 days and meeting all other requirements  I discussed the assessment and treatment plan with the patient. The patient was provided an opportunity to ask questions and all were answered. The patient agreed with the plan and demonstrated an understanding of the  instructions.  Patient advised to call back or seek an in-person evaluation if the symptoms or condition worsens.  Duration of encounter: 30 minutes.  Total time on encounter, as per AMA guidelines included both the face-to-face and non-face-to-face time personally spent by the physician and/or other qualified health care professional(s) on the day of the encounter (includes time in activities that require the physician or other qualified health care professional and does not include time in activities normally performed by clinical staff). Physician's time may include the following activities when performed: Preparing to see the patient (e.g., pre-charting review of records, searching for previously ordered imaging, lab work, and nerve conduction tests) Review of prior analgesic pharmacotherapies. Reviewing PMP Interpreting ordered tests (e.g., lab work, imaging, nerve conduction tests) Performing post-procedure evaluations, including interpretation of diagnostic procedures Obtaining and/or reviewing separately obtained history Performing a medically appropriate examination and/or evaluation Counseling and educating the patient/family/caregiver Ordering medications, tests, or procedures Referring and communicating with other health care professionals (when not separately reported) Documenting clinical information in the electronic or other health record Independently interpreting results (not separately reported) and communicating results to the patient/ family/caregiver Care coordination (not separately reported)  Note by: Edward Jolly, MD (TTS and AI technology used. I apologize for any typographical errors that were not detected and corrected.) Date: 09/26/2023; Time: 3:45 PM

## 2023-09-26 NOTE — Patient Instructions (Addendum)
 Do not take your gabapentin within 1 hr of your hydrocodone at night ______________________________________________________________________    Preparing for your procedure  Appointments: If you think you may not be able to keep your appointment, call 24-48 hours in advance to cancel. We need time to make it available to others.  Procedure visits are for procedures only. During your procedure appointment there will be: NO Prescription Refills*. NO medication changes or discussions*. NO discussion of disability issues*. NO unrelated pain problem evaluations*. NO evaluations to order other pain procedures*. *These will be addressed at a separate and distinct evaluation encounter on the provider's evaluation schedule and not during procedure days.  Instructions: Food intake: Avoid eating anything solid for at least 8 hours prior to your procedure. Clear liquid intake: You may take clear liquids such as water up to 2 hours prior to your procedure. (No carbonated drinks. No soda.) Transportation: Unless otherwise stated by your physician, bring a driver. (Driver cannot be a Market researcher, Pharmacist, community, or any other form of public transportation.) Morning Medicines: Except for blood thinners, take all of your other morning medications with a sip of water. Make sure to take your heart and blood pressure medicines. If your blood pressure's lower number is above 100, the case will be rescheduled. Blood thinners: Make sure to stop your blood thinners as instructed.  If you take a blood thinner, but were not instructed to stop it, call our office [phone] and ask to talk to a nurse. Not stopping a blood thinner prior to certain procedures could lead to serious complications. Diabetics on insulin: Notify the staff so that you can be scheduled 1st case in the morning. If your diabetes requires high dose insulin, take only  of your normal insulin dose the morning of the procedure and notify the staff that you have done  so. Preventing infections: Shower with an antibacterial soap the morning of your procedure.  Build-up your immune system: Take 1000 mg of Vitamin C with every meal (3 times a day) the day prior to your procedure. Antibiotics: Inform the nursing staff if you are taking any antibiotics or if you have any conditions that may require antibiotics prior to procedures. (Example: recent joint implants)   Pregnancy: If you are pregnant make sure to notify the nursing staff. Not doing so may result in injury to the fetus, including death.  Sickness: If you have a cold, fever, or any active infections, call and cancel or reschedule your procedure. Receiving steroids while having an infection may result in complications. Arrival: You must be in the facility at least 30 minutes prior to your scheduled procedure. Tardiness: Your scheduled time is also the cutoff time. If you do not arrive at least 15 minutes prior to your procedure, you will be rescheduled.  Children: Do not bring any children with you. Make arrangements to keep them home. Dress appropriately: There is always a possibility that your clothing may get soiled. Avoid long dresses. Valuables: Do not bring any jewelry or valuables.  Reasons to call and reschedule or cancel your procedure: (Following these recommendations will minimize the risk of a serious complication.) Surgeries: Avoid having procedures within 2 weeks of any surgery. (Avoid for 2 weeks before or after any surgery). Flu Shots: Avoid having procedures within 2 weeks of a flu shots or . (Avoid for 2 weeks before or after immunizations). Barium: Avoid having a procedure within 7-10 days after having had a radiological study involving the use of radiological contrast. (Myelograms, Barium swallow  or enema study). Heart attacks: Avoid any elective procedures or surgeries for the initial 6 months after a "Myocardial Infarction" (Heart Attack). Blood thinners: It is imperative that you stop  these medications before procedures. Let us know if you if you take any blood thinner.  Infection: Avoid procedures during or within two weeks of an infection (including chest colds or gastrointestinal problems). Symptoms associated with infections include: Localized redness, fever, chills, night sweats or profuse sweating, burning sensation when voiding, cough, congestion, stuffiness, runny nose, sore throat, diarrhea, nausea, vomiting, cold or Flu symptoms, recent or current infections. It is specially important if the infection is over the area that we intend to treat. Heart and lung problems: Symptoms that may suggest an active cardiopulmonary problem include: cough, chest pain, breathing difficulties or shortness of breath, dizziness, ankle swelling, uncontrolled high or unusually low blood pressure, and/or palpitations. If you are experiencing any of these symptoms, cancel your procedure and contact your primary care physician for an evaluation.  Remember:  Regular Business hours are:  Monday to Thursday 8:00 AM to 4:00 PM  Provider's Schedule: Delano Metz, MD:  Procedure days: Tuesday and Thursday 7:30 AM to 4:00 PM  Edward Jolly, MD:  Procedure days: Monday and Wednesday 7:30 AM to 4:00 PM Last  Updated: 05/31/2023 ______________________________________________________________________

## 2023-10-11 ENCOUNTER — Ambulatory Visit: Payer: Self-pay | Admitting: *Deleted

## 2023-10-11 ENCOUNTER — Ambulatory Visit (INDEPENDENT_AMBULATORY_CARE_PROVIDER_SITE_OTHER): Admitting: Internal Medicine

## 2023-10-11 ENCOUNTER — Encounter: Payer: Self-pay | Admitting: Internal Medicine

## 2023-10-11 ENCOUNTER — Other Ambulatory Visit

## 2023-10-11 VITALS — BP 160/108 | HR 80 | Ht 63.0 in | Wt 178.4 lb

## 2023-10-11 DIAGNOSIS — E782 Mixed hyperlipidemia: Secondary | ICD-10-CM | POA: Diagnosis not present

## 2023-10-11 DIAGNOSIS — J301 Allergic rhinitis due to pollen: Secondary | ICD-10-CM | POA: Diagnosis not present

## 2023-10-11 DIAGNOSIS — M0579 Rheumatoid arthritis with rheumatoid factor of multiple sites without organ or systems involvement: Secondary | ICD-10-CM | POA: Diagnosis not present

## 2023-10-11 DIAGNOSIS — E1159 Type 2 diabetes mellitus with other circulatory complications: Secondary | ICD-10-CM

## 2023-10-11 DIAGNOSIS — N184 Chronic kidney disease, stage 4 (severe): Secondary | ICD-10-CM | POA: Diagnosis not present

## 2023-10-11 DIAGNOSIS — E1169 Type 2 diabetes mellitus with other specified complication: Secondary | ICD-10-CM

## 2023-10-11 DIAGNOSIS — E1165 Type 2 diabetes mellitus with hyperglycemia: Secondary | ICD-10-CM

## 2023-10-11 DIAGNOSIS — I152 Hypertension secondary to endocrine disorders: Secondary | ICD-10-CM | POA: Diagnosis not present

## 2023-10-11 MED ORDER — FLUTICASONE PROPIONATE 50 MCG/ACT NA SUSP: 2.0000 | Freq: Every day | NASAL | 3 refills | Status: AC

## 2023-10-11 NOTE — Progress Notes (Signed)
 Established Patient Office Visit  Subjective:  Patient ID: [name], female    DOB: 07-30-1945  Age: 78 y.o. MRN: [id number]  Chief Complaint  Patient presents with   Follow-up    Wants several tests done    Patient comes in for follow-up today.  Feeling a little tired, since she moved into a new apartment and there is a moldy storage unit attached to it.  Feels nasal and sinus congestion.  No cough and no chest congestion.  Advised to use her Flonase  nasal spray and antihistamine.  She is under care of pain clinic for her final stenosis. Her blood pressure is high today, but she is a little confused about her medications.  Will check her labs today and advised to return in 1 week to discuss the results and bring all her medications from home.    No other concerns at this time.   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 b.)07/18/2006 -endovas oblit complex ACOM aneur. c.) Endovas Tx of 6.8x53mm RMCA aneur. d.) 3.5x23mm remnant RMCA aneur 2/2 coil compaction. e.) Interval 3.2x3.91mm saccular outpouching in ACOM c/w mild recannulization in neck. 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 g.) 3.7x57mm remnant of prev Tx'd ACOM aneur -failed embol 02/24/2012.   CKD (chronic kidney disease), stage IV (HCC)    a.) solitary functioning kidney on the RIGHT   Complication of anesthesia    a.) delayed emergence   Coronary artery disease 02/23/2007   a.) LHC 02/23/2007 --> EF 50%; 30% pLAD, 70% mRCA --> planned for staged PCI. b.) PCI 02/27/2007: EF 60%; 3.5 x 15 mm Vision BMS to 80% mRCA. b.) LHC 07/24/2007: EF 60%; minor irregs; no occlusive CAD; no intervention. c.) LHC 10/28/2010: EF 60%; 30% mLAD, LCx with minor luminal irregs, 20% ISR mRCA; no intervention. d.) LHC 05/31/2013: EF 60%; 40% mLAD, 30% ISR m-dRCA; no interventions.   DDD  (degenerative disc disease), cervical    a.) s/p ACDF C5-C7; hardware in neck; patient appreciates stiffness and issues with mobility   Diastolic dysfunction    a.)  TTE 07/07/2021: EF 87.8%; normal PASP; trace TR/MR; G1DD.   Elevated LEFT hemidiaphragm    Gout    Headache(784.0)    Heart murmur    HLD (hyperlipidemia)    HOH (hard of hearing)    Has hearing aids, doesn't wear   Hyperkalemia    Hyperparathyroidism due to renal insufficiency (HCC)    Hypertension    Incomplete right bundle branch block (RBBB)    Insomnia    a.) takes zolpidem    Long term (current) use of immunomodulator    a.) on DMARD therapy (hydroxychloroquine ) for RA/SLE Dx.   Lumbar adjacent segment disease with spondylolisthesis    Multiple acquired cysts of kidney    On chronic clopidogrel  therapy    OSA (obstructive sleep apnea)    a.) does NOT use nocturnal pap therapy   Osteoarthritis    Pancreatic cyst    Peripheral vascular disease (HCC)    Pneumonia    Post-COVID chronic cough    Pulmonary nodule    RBBB (right bundle branch block)    Rectal bleeding    Rheumatoid arthritis (HCC)    a.) on DMARD; hydroxychloriquine   Shortness of breath 03/01/2023   Systemic lupus erythematosus (HCC)    T2DM (type  2 diabetes mellitus) (HCC)    Tendinitis of left wrist    Thickened endometrium    Wears dentures    partial lower    Past Surgical History:  Procedure Laterality Date   ANEURYSM COILING  08/2013   ANTERIOR LATERAL LUMBAR FUSION WITH PERCUTANEOUS SCREW 1 LEVEL N/A 03/09/2023   Procedure: L3-4 LATERAL LUMBAR INTERBODY FUSION;  Surgeon: Jodeen Munch, MD;  Location: [facility];  Service: Neurosurgery;  Laterality: N/A;   APPLICATION OF INTRAOPERATIVE CT SCAN N/A 03/09/2023   Procedure: APPLICATION OF INTRAOPERATIVE CT SCAN;  Surgeon: Jodeen Munch, MD;  Location: [facility];  Service: Neurosurgery;  Laterality: N/A;   Attempted embolization of previously treated ACOM aneurysm; unsuccessful N/A  01/28/2012   Location: [facility]   BACK SURGERY     BARTHOLIN CYST MARSUPIALIZATION     BICEPT TENODESIS Right 03/02/2022   Procedure: BICEPS TENODESIS;  Surgeon: Elner Hahn, MD;  Location: [facility];  Service: Orthopedics;  Laterality: Right;   BREAST CYST ASPIRATION Right 01/25/2012   FNA neg.   BREAST EXCISIONAL BIOPSY Left 06/26/2007   neg   BUNIONECTOMY Bilateral 2022   CARDIAC CATHETERIZATION Left 02/23/2007   Procedure: CARDIAC CATHETERIZATION; Location: [facility]; Surgeon: Debborah Fairly, MD   CARDIAC CATHETERIZATION Left 07/31/2007   Procedure: CARDIAC CATHETERIZATION; Location: [facility]; Surgeon: Debborah Fairly, MD   CARDIAC CATHETERIZATION Left 10/28/2010   Procedure: CARDIAC CATHETERIZATION; Location: [facility]; Surgeon: Debborah Fairly, MD   CARDIAC CATHETERIZATION Left 05/31/2013   Procedure: CARDIAC CATHETERIZATION; Location: [facility]; Surgeon: Debborah Fairly, MD   Carotid arteriogram; 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 N/A 07/18/2006   Location: [facility]   CATARACT EXTRACTION     CERVICAL FUSION N/A    Procedure: ACDF C5-C7   COLONOSCOPY WITH PROPOFOL  N/A 10/04/2019   Procedure: COLONOSCOPY WITH BIOPSY;  Surgeon: Selena Daily, MD;  Location: [facility];  Service: Endoscopy;  Laterality: N/A;  Diabetic (borderline) - oral meds priority 3   CORONARY STENT INTERVENTION Left 02/27/2007   Procedure: CORONARY STENT INTERVENTION (3.5 x 15 mm Vision BMS to mRCA); Location: [facility]; Surgeon: Antionette Kirks, MD   Endovascular near complete obliteration of enlarging neck remnant of previously treated RIGHT MCA aneurysm using stent assisted coiling N/A 09/03/2010   Location: [facility]   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 N/A 10/03/2006   Location: [facility]   HYSTEROSCOPY WITH D & C N/A 08/04/2021   Procedure: DILATATION AND CURETTAGE /HYSTEROSCOPY;  Surgeon: Heron Lord, MD;  Location: [facility];  Service: Gynecology;  Laterality: N/A;   POLYPECTOMY N/A 10/04/2019   Procedure: POLYPECTOMY;  Surgeon: Selena Daily, MD;  Location: [facility];  Service: Endoscopy;  Laterality: N/A;   REVERSE SHOULDER ARTHROPLASTY Right 03/02/2022   Procedure: REVERSE SHOULDER ARTHROPLASTY;  Surgeon: Elner Hahn, MD;  Location: [facility];  Service: Orthopedics;  Laterality: Right;   UPPER ESOPHAGEAL ENDOSCOPIC ULTRASOUND (EUS) N/A 04/15/2016   Procedure: UPPER ESOPHAGEAL ENDOSCOPIC ULTRASOUND (EUS);  Surgeon: Eloisa Hait, MD;  Location: [facility];  Service: Gastroenterology;  Laterality: N/A;    Social History   Socioeconomic History   Marital status: Widowed    Spouse name: Not on file   Number of children: Not on file   Years of education: Not on file   Highest education level: Not on file  Occupational History   Not on file  Tobacco Use   Smoking status: Never    Passive exposure:  Never   Smokeless tobacco: Never  Vaping Use   Vaping status: Never Used  Substance and Sexual Activity   Alcohol use: Yes    Comment: Occasionally at christmas   Drug use: No   Sexual activity: Not Currently    Birth control/protection: Post-menopausal  Other Topics Concern   Not on file  Social History Narrative   Lives with daughter   Social Drivers of Health   Financial Resource Strain: Low Risk  (02/12/2022)   Overall Financial Resource Strain (CARDIA)    Difficulty of Paying Living Expenses: Not very hard  Food Insecurity: No Food Insecurity (03/09/2023)   Hunger Vital Sign    Worried About Running Out of Food in the Last Year: Never true    Ran Out of Food in the Last Year: Never true  Transportation Needs: No Transportation Needs (03/09/2023)   PRAPARE - Administrator, Civil Service (Medical): No    Lack of Transportation (Non-Medical): No  Physical Activity: Not on file  Stress: No Stress Concern Present (02/26/2022)    [employer] of Occupational Health - Occupational Stress Questionnaire    Feeling of Stress : Not at all  Social Connections: Moderately Isolated (02/26/2022)   Social Connection and Isolation Panel [NHANES]    Frequency of Communication with Friends and Family: More than three times a week    Frequency of Social Gatherings with Friends and Family: More than three times a week    Attends Religious Services: Never    Database administrator or Organizations: Yes    Attends Banker Meetings: Never    Marital Status: Widowed  Intimate Partner Violence: Not At Risk (03/09/2023)   Humiliation, Afraid, Rape, and Kick questionnaire    Fear of Current or Ex-Partner: No    Emotionally Abused: No    Physically Abused: No    Sexually Abused: No    Family History  Problem Relation Age of Onset   Breast cancer Paternal Aunt 30   Breast cancer Paternal Aunt    Heart disease Mother    Heart disease Father    Stroke Father    Diabetes Sister    Diabetes Brother     Allergies  Allergen Reactions   Isosorbide Dinitrate Other (See Comments)    Collapse   Amlodipine Swelling   Sulfa Antibiotics Itching   Tizanidine Hcl    Oxycodone  Itching   Sulfasalazine Itching    Outpatient Medications Prior to Visit  Medication Sig   allopurinol  (ZYLOPRIM ) 100 MG tablet TAKE 1 TABLET BY MOUTH EVERY DAY   clotrimazole -betamethasone (LOTRISONE) cream APPLY TWICE A DAY TO GROIN AREA SKIN FOR 1 WEEK AND THEN AS NEEDED.   furosemide  (LASIX ) 40 MG tablet TAKE 1 TABLET BY MOUTH EVERY DAY   gabapentin  (NEURONTIN ) 100 MG capsule Take 1-3 capsules (100-300 mg total) by mouth at bedtime.   glimepiride  (AMARYL ) 2 MG tablet TAKE 1 TABLET (2 MG TOTAL) BY MOUTH SEE ADMIN INSTRUCTIONS. TAKE 1 MG DAILY, MAY INCREASE TO 2 MG IF BLOOD SUGAR IS OVER 175   hydrALAZINE  (APRESOLINE ) 100 MG tablet TAKE 1 TABLET BY MOUTH TWICE A DAY (Patient taking differently: Take 100 mg by mouth 2 (two) times daily.  Takes/1/2 tablet BID)   HYDROcodone -acetaminophen  (NORCO/VICODIN) 5-325 MG tablet Take 1 tablet by mouth 3 (three) times daily as needed for severe pain (pain score 7-10). Must last 30 days   hydroxychloroquine  (PLAQUENIL ) 200 MG tablet Take 200 mg by mouth daily.  labetalol  (NORMODYNE ) 300 MG tablet TAKE 1 TABLET BY MOUTH TWICE A DAY   losartan  (COZAAR ) 100 MG tablet Take 1 tablet (100 mg total) by mouth every morning.   methocarbamol  (ROBAXIN ) 500 MG tablet TAKE 1 TABLET BY MOUTH EVERY 6 HOURS AS NEEDED FOR MUSCLE SPASMS.   nitroGLYCERIN  (NITROSTAT ) 0.6 MG SL tablet TAKE 1 TABLET BY MOUTH AS NEEDED FOR CP. WAIT 5 MINUTES BEFORE NEXT DOSE. PROCEED TO ER IF NO RELIEF AFTER 3 DOSES   pantoprazole (PROTONIX) 40 MG tablet TAKE 1 TABLET BY MOUTH EVERY DAY   rosuvastatin  (CRESTOR ) 40 MG tablet TAKE 1 TABLET BY MOUTH EVERY DAY   senna (SENOKOT) 8.6 MG TABS tablet Take 1 tablet (8.6 mg total) by mouth daily as needed for mild constipation.   SYMBICORT 80-4.5 MCG/ACT inhaler INHALE 1 PUFF BY MOUTH TWICE A DAY   tretinoin (RETIN-A) 0.05 % cream APPLY TO AFFECTED AREA EVERY DAY AT BEDTIME   zolpidem  (AMBIEN ) 5 MG tablet TAKE 1 TABLET BY MOUTH EVERY DAY AT BEDTIME AS NEEDED FOR SLEEP   [DISCONTINUED] fluticasone  (FLONASE ) 50 MCG/ACT nasal spray SPRAY 2 SPRAYS INTO EACH NOSTRIL EVERY DAY (Patient taking differently: Place 1 spray into both nostrils daily as needed.)   traMADol  (ULTRAM ) 50 MG tablet Take 1 tablet (50 mg total) by mouth every 6 (six) hours as needed for moderate pain (pain score 4-6). (Patient not taking: Reported on 10/11/2023)   [DISCONTINUED] amoxicillin -clavulanate (AUGMENTIN ) 500-125 MG tablet Take 1 tablet by mouth 2 (two) times daily. (Patient not taking: Reported on 10/11/2023)   [DISCONTINUED] tretinoin (RETIN-A) 0.025 % cream TWICE A DAY (Patient not taking: Reported on 10/11/2023)   No facility-administered medications prior to visit.    Review of Systems  Constitutional:  Positive  for malaise/fatigue. Negative for chills, fever and weight loss.  HENT:  Positive for congestion. Negative for sore throat.   Eyes: Negative.   Respiratory: Negative.  Negative for cough, shortness of breath and stridor.   Cardiovascular: Negative.  Negative for chest pain, palpitations and leg swelling.  Gastrointestinal: Negative.  Negative for abdominal pain, constipation, diarrhea, heartburn, nausea and vomiting.  Genitourinary: Negative.  Negative for dysuria and flank pain.  Musculoskeletal:  Positive for back pain and joint pain. Negative for myalgias.  Skin: Negative.   Neurological: Negative.  Negative for dizziness, tingling, tremors and headaches.  Endo/Heme/Allergies: Negative.   Psychiatric/Behavioral: Negative.  Negative for depression and suicidal ideas. The patient is not nervous/anxious.        Objective:   BP (!) 160/108   Pulse 80   Ht 5\' 3"  (1.6 m)   Wt 178 lb 6.4 oz (80.9 kg)   SpO2 98%   BMI 31.60 kg/m   Vitals:   10/11/23 1308  BP: (!) 160/108  Pulse: 80  Height: 5\' 3"  (1.6 m)  Weight: 178 lb 6.4 oz (80.9 kg)  SpO2: 98%  BMI (Calculated): 31.61    Physical Exam Vitals and nursing note reviewed.  Constitutional:      Appearance: Normal appearance.  HENT:     Head: Normocephalic and atraumatic.     Nose: Nose normal.     Mouth/Throat:     Mouth: Mucous membranes are moist.     Pharynx: Oropharynx is clear.  Eyes:     Conjunctiva/sclera: Conjunctivae normal.     Pupils: Pupils are equal, round, and reactive to light.  Cardiovascular:     Rate and Rhythm: Normal rate and regular rhythm.     Pulses: Normal  pulses.     Heart sounds: Normal heart sounds. No murmur heard. Pulmonary:     Effort: Pulmonary effort is normal.     Breath sounds: Normal breath sounds. No wheezing.  Abdominal:     General: Bowel sounds are normal.     Palpations: Abdomen is soft.     Tenderness: There is no abdominal tenderness. There is no right CVA tenderness or  left CVA tenderness.  Musculoskeletal:        General: Normal range of motion.     Cervical back: Normal range of motion.     Right lower leg: No edema.     Left lower leg: No edema.  Skin:    General: Skin is warm and dry.  Neurological:     General: No focal deficit present.     Mental Status: She is alert and oriented to person, place, and time.  Psychiatric:        Mood and Affect: Mood normal.        Behavior: Behavior normal.      No results found for any visits on 10/11/23.  Recent Results (from the past 2160 hours)  Compliance Drug Analysis, Ur     Status: None   Collection Time: 08/04/23  3:03 PM  Result Value Ref Range   Summary FINAL     Comment: ==================================================================== Compliance Drug Analysis, Ur ==================================================================== Test                             Result       Flag       Units  Drug Present and Declared for Prescription Verification   Gabapentin                      PRESENT      EXPECTED   Methocarbamol                   PRESENT      EXPECTED   Zolpidem                        PRESENT      EXPECTED   Zolpidem  Acid                  PRESENT      EXPECTED    Zolpidem  acid is an expected metabolite of zolpidem .  Drug Absent but Declared for Prescription Verification   Tramadol                        Not Detected UNEXPECTED ng/mg creat ==================================================================== Test                      Result    Flag   Units      Ref Range   Creatinine              187              mg/dL      >=40 ==================================================================== Declared Medications:  The flagg ing and interpretation on this report are based on the  following declared medications.  Unexpected results may arise from  inaccuracies in the declared medications.   **Note: The testing scope of this panel includes these medications:   Gabapentin   (Neurontin )  Methocarbamol  (Robaxin )  Tramadol    **Note: The testing scope of this panel does not include small to  moderate amounts of these reported medications:   Zolpidem  (Ambien )   **Note: The testing scope of this panel does not include the  following reported medications:   Allopurinol  (Zyloprim )  Betamethasone (Lotrisone)  Budesonide (Symbicort)  Clotrimazole  (Lotrisone)  Fluticasone  (Flonase )  Formoterol  (Symbicort)  Furosemide  (Lasix )  Glimepiride  (Amaryl )  Hydralazine  (Apresoline )  Hydroxychloroquine  (Plaquenil )  Losartan  (Cozaar )  Nitroglycerin  (Nitrostat )  Normodyne   Pantoprazole (Protonix)  Rosuvastatin  (Crestor )  Sennosides (Senokot)  Sodium Zirconium Cyclosilicate (Lokelma)  Tretinoin (Retin -A) ==================================================================== For clinical consultation, please call [phone]. ====================================================================   POCT CBG (Fasting - Glucose)     Status: Abnormal   Collection Time: 08/05/23  1:11 PM  Result Value Ref Range   Glucose Fasting, POC 171 (A) 70 - 99 mg/dL  POCT Urinalysis Dipstick (09811)     Status: Abnormal   Collection Time: 08/05/23  1:40 PM  Result Value Ref Range   Color, UA yellow    Clarity, UA cloudy    Glucose, UA Negative Negative   Bilirubin, UA neg    Ketones, UA neg    Spec Grav, UA 1.025 1.010 - 1.025   Blood, UA neg    pH, UA 5.5 5.0 - 8.0   Protein, UA Positive (A) Negative   Urobilinogen, UA 0.2 0.2 or 1.0 E.U./dL   Nitrite, UA pos    Leukocytes, UA Small (1+) (A) Negative   Appearance cloudy    Odor yes   CMP14+EGFR     Status: Abnormal   Collection Time: 08/05/23  2:08 PM  Result Value Ref Range   Glucose 114 (H) 70 - 99 mg/dL   BUN 41 (H) 8 - 27 mg/dL   Creatinine, Ser 9.14 (H) 0.57 - 1.00 mg/dL   eGFR 19 (L) >78 GN/FAO/1.30   BUN/Creatinine Ratio 16 12 - 28   Sodium 148 (H) 134 - 144 mmol/L   Potassium 5.2 3.5 - 5.2 mmol/L    Chloride 113 (H) 96 - 106 mmol/L   CO2 22 20 - 29 mmol/L   Calcium  9.6 8.7 - 10.3 mg/dL   Total Protein 5.9 (L) 6.0 - 8.5 g/dL   Albumin  3.9 3.8 - 4.8 g/dL   Globulin, Total 2.0 1.5 - 4.5 g/dL   Bilirubin Total 0.7 0.0 - 1.2 mg/dL   Alkaline Phosphatase 81 44 - 121 IU/L   AST 13 0 - 40 IU/L   ALT 6 0 - 32 IU/L  Urine Culture     Status: Abnormal   Collection Time: 08/05/23  3:45 PM   Specimen: Urine   UR  Result Value Ref Range   Urine Culture, Routine Final report (A)    Organism ID, Bacteria Escherichia coli (A)     Comment: Cefazolin  with an MIC <=16 predicts susceptibility to the oral agents cefaclor, cefdinir , cefpodoxime, cefprozil, cefuroxime, cephalexin , and loracarbef when used for therapy of uncomplicated urinary tract infections due to E. coli, Klebsiella pneumoniae, and Proteus mirabilis. Multi-Drug Resistant Organism Greater than 100,000 colony forming units per mL    Antimicrobial Susceptibility Comment     Comment:       ** S = Susceptible; I = Intermediate; R = Resistant **                    P = Positive; N = Negative             MICS are expressed in micrograms per mL    Antibiotic  RSLT#1    RSLT#2    RSLT#3    RSLT#4 Amoxicillin /Clavulanic Acid    S Ampicillin                     R Cefazolin                       S Cefepime                       S Cefoxitin                      S Cefpodoxime                    S Ceftriaxone                    S Ciprofloxacin                   R Ertapenem                      S Gentamicin                     S Levofloxacin                    R Meropenem                      S Nitrofurantoin                 S Piperacillin/Tazobactam        S Tetracycline                   R Tobramycin                     S Trimethoprim/Sulfa             R       Assessment & Plan:  Check labs today.  Come in 1 week with her medications.  Will discuss results and adjust meds further. She has a history of pancreatic mass  which has been considered benign up till now.  Will continue to monitor. Problem List Items Addressed This Visit     Seasonal allergic rhinitis due to pollen   Relevant Medications   fluticasone  (FLONASE ) 50 MCG/ACT nasal spray   Rheumatoid arthritis involving multiple sites with positive rheumatoid factor (HCC)   Type 2 diabetes mellitus with hyperglycemia, without long-term current use of insulin  (HCC) - Primary   Relevant Orders   Hemoglobin A1c   Hypertension associated with diabetes (HCC)   Relevant Orders   CMP14+EGFR   Chronic kidney disease, stage IV (severe) (HCC)   Relevant Orders   CBC with Diff   Combined hyperlipidemia associated with type 2 diabetes mellitus (HCC)   Relevant Orders   Lipid Panel w/o Chol/HDL Ratio    Return in about 1 week (around 10/18/2023).   Total time spent: 30 minutes  Aisha Hove, MD  10/11/2023   This document may have been prepared by [facility] Voice Recognition software and as such may include unintentional dictation errors.

## 2023-10-12 ENCOUNTER — Ambulatory Visit: Admitting: Student in an Organized Health Care Education/Training Program

## 2023-10-12 LAB — CBC WITH DIFFERENTIAL/PLATELET
Basophils Absolute: 0 10*3/uL (ref 0.0–0.2)
Basos: 1 %
EOS (ABSOLUTE): 0.2 10*3/uL (ref 0.0–0.4)
Eos: 4 %
Hematocrit: 32 % — ABNORMAL LOW (ref 34.0–46.6)
Hemoglobin: 10.3 g/dL — ABNORMAL LOW (ref 11.1–15.9)
Immature Grans (Abs): 0 10*3/uL (ref 0.0–0.1)
Immature Granulocytes: 0 %
Lymphocytes Absolute: 1.8 10*3/uL (ref 0.7–3.1)
Lymphs: 31 %
MCH: 29.7 pg (ref 26.6–33.0)
MCHC: 32.2 g/dL (ref 31.5–35.7)
MCV: 92 fL (ref 79–97)
Monocytes Absolute: 0.3 10*3/uL (ref 0.1–0.9)
Monocytes: 5 %
Neutrophils Absolute: 3.5 10*3/uL (ref 1.4–7.0)
Neutrophils: 59 %
Platelets: 188 10*3/uL (ref 150–450)
RBC: 3.47 x10E6/uL — ABNORMAL LOW (ref 3.77–5.28)
RDW: 12.9 % (ref 11.7–15.4)
WBC: 5.9 10*3/uL (ref 3.4–10.8)

## 2023-10-12 LAB — CMP14+EGFR
ALT: 11 IU/L (ref 0–32)
AST: 15 IU/L (ref 0–40)
Albumin: 3.9 g/dL (ref 3.8–4.8)
Alkaline Phosphatase: 80 IU/L (ref 44–121)
BUN/Creatinine Ratio: 19 (ref 12–28)
BUN: 57 mg/dL — ABNORMAL HIGH (ref 8–27)
Bilirubin Total: 0.4 mg/dL (ref 0.0–1.2)
CO2: 21 mmol/L (ref 20–29)
Calcium: 9.2 mg/dL (ref 8.7–10.3)
Chloride: 112 mmol/L — ABNORMAL HIGH (ref 96–106)
Creatinine, Ser: 3.04 mg/dL — ABNORMAL HIGH (ref 0.57–1.00)
Globulin, Total: 2 g/dL (ref 1.5–4.5)
Glucose: 101 mg/dL — ABNORMAL HIGH (ref 70–99)
Potassium: 4.2 mmol/L (ref 3.5–5.2)
Sodium: 148 mmol/L — ABNORMAL HIGH (ref 134–144)
Total Protein: 5.9 g/dL — ABNORMAL LOW (ref 6.0–8.5)
eGFR: 15 mL/min/{1.73_m2} — ABNORMAL LOW (ref >59)

## 2023-10-12 LAB — LIPID PANEL W/O CHOL/HDL RATIO
Cholesterol, Total: 279 mg/dL — ABNORMAL HIGH (ref 100–199)
HDL: 38 mg/dL — ABNORMAL LOW (ref >39)
LDL Chol Calc (NIH): 184 mg/dL — ABNORMAL HIGH (ref 0–99)
Triglycerides: 291 mg/dL — ABNORMAL HIGH (ref 0–149)
VLDL Cholesterol Cal: 57 mg/dL — ABNORMAL HIGH (ref 5–40)

## 2023-10-12 LAB — HEMOGLOBIN A1C
Est. average glucose Bld gHb Est-mCnc: 128 mg/dL
Hgb A1c MFr Bld: 6.1 % — ABNORMAL HIGH (ref 4.8–5.6)

## 2023-10-14 ENCOUNTER — Other Ambulatory Visit: Payer: Self-pay | Admitting: Internal Medicine

## 2023-10-14 DIAGNOSIS — E1169 Type 2 diabetes mellitus with other specified complication: Secondary | ICD-10-CM

## 2023-10-14 MED ORDER — EZETIMIBE 10 MG PO TABS: 10.0000 mg | ORAL_TABLET | Freq: Every day | ORAL | 3 refills | Status: DC

## 2023-10-14 NOTE — Progress Notes (Signed)
 Patient notified

## 2023-10-18 ENCOUNTER — Other Ambulatory Visit: Payer: Self-pay

## 2023-10-18 ENCOUNTER — Ambulatory Visit (INDEPENDENT_AMBULATORY_CARE_PROVIDER_SITE_OTHER): Admitting: Internal Medicine

## 2023-10-18 ENCOUNTER — Encounter: Payer: Self-pay | Admitting: Internal Medicine

## 2023-10-18 ENCOUNTER — Encounter: Payer: Self-pay | Admitting: *Deleted

## 2023-10-18 VITALS — BP 138/88 | HR 73 | Ht 63.0 in | Wt 180.0 lb

## 2023-10-18 DIAGNOSIS — E1165 Type 2 diabetes mellitus with hyperglycemia: Secondary | ICD-10-CM | POA: Diagnosis not present

## 2023-10-18 DIAGNOSIS — K8020 Calculus of gallbladder without cholecystitis without obstruction: Secondary | ICD-10-CM | POA: Insufficient documentation

## 2023-10-18 DIAGNOSIS — N184 Chronic kidney disease, stage 4 (severe): Secondary | ICD-10-CM | POA: Diagnosis not present

## 2023-10-18 DIAGNOSIS — E782 Mixed hyperlipidemia: Secondary | ICD-10-CM | POA: Diagnosis not present

## 2023-10-18 DIAGNOSIS — E1169 Type 2 diabetes mellitus with other specified complication: Secondary | ICD-10-CM | POA: Diagnosis not present

## 2023-10-18 DIAGNOSIS — M0579 Rheumatoid arthritis with rheumatoid factor of multiple sites without organ or systems involvement: Secondary | ICD-10-CM | POA: Diagnosis not present

## 2023-10-18 DIAGNOSIS — I152 Hypertension secondary to endocrine disorders: Secondary | ICD-10-CM

## 2023-10-18 DIAGNOSIS — E1159 Type 2 diabetes mellitus with other circulatory complications: Secondary | ICD-10-CM

## 2023-10-18 LAB — POCT CBG (FASTING - GLUCOSE)-MANUAL ENTRY: Glucose Fasting, POC: 147 mg/dL — AB (ref 70–99)

## 2023-10-18 NOTE — Patient Outreach (Addendum)
 Complex Care Management   Visit Note  10/18/2023 Late entry for 10/11/23  Name:  [name] MRN: [id number] DOB: [birth year]  Situation: Referral received for Complex Care Management related to Diabetes with Complications, Chronic Kidney Disease, and SDOH Barriers:  Transportation I obtained verbal consent from Patient.  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 b.)07/18/2006 -endovas oblit complex ACOM aneur. c.) Endovas Tx of 6.8x76mm RMCA aneur. d.) 3.5x16mm remnant RMCA aneur 2/2 coil compaction. e.) Interval 3.2x3.47mm saccular outpouching in ACOM c/w mild recannulization in neck. 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 g.) 3.7x78mm remnant of prev Tx'd ACOM aneur -failed embol 02/24/2012.   CKD (chronic kidney disease), stage IV (HCC)    a.) solitary functioning kidney on the RIGHT   Complication of anesthesia    a.) delayed emergence   Coronary artery disease 02/23/2007   a.) LHC 02/23/2007 --> EF 50%; 30% pLAD, 70% mRCA --> planned for staged PCI. b.) PCI 02/27/2007: EF 60%; 3.5 x 15 mm Vision BMS to 80% mRCA. b.) LHC 07/24/2007: EF 60%; minor irregs; no occlusive CAD; no intervention. c.) LHC 10/28/2010: EF 60%; 30% mLAD, LCx with minor luminal irregs, 20% ISR mRCA; no intervention. d.) LHC 05/31/2013: EF 60%; 40% mLAD, 30% ISR m-dRCA; no interventions.   DDD (degenerative disc disease), cervical    a.) s/p ACDF C5-C7; hardware in neck; patient appreciates stiffness and issues with mobility   Diastolic dysfunction    a.)  TTE 07/07/2021: EF 87.8%; normal PASP; trace TR/MR; G1DD.   Elevated LEFT hemidiaphragm    Gout    Headache(784.0)    Heart murmur    HLD (hyperlipidemia)    HOH (hard of hearing)    Has hearing aids, doesn't wear   Hyperkalemia     Hyperparathyroidism due to renal insufficiency (HCC)    Hypertension    Incomplete right bundle branch block (RBBB)    Insomnia    a.) takes zolpidem    Long term (current) use of immunomodulator    a.) on DMARD therapy (hydroxychloroquine ) for RA/SLE Dx.   Lumbar adjacent segment disease with spondylolisthesis    Multiple acquired cysts of kidney    On chronic clopidogrel  therapy    OSA (obstructive sleep apnea)    a.) does NOT use nocturnal pap therapy   Osteoarthritis    Pancreatic cyst    Peripheral vascular disease (HCC)    Pneumonia    Post-COVID chronic cough    Pulmonary nodule    RBBB (right bundle branch block)    Rectal bleeding    Rheumatoid arthritis (HCC)    a.) on DMARD; hydroxychloriquine   Shortness of breath 03/01/2023   Systemic lupus erythematosus (HCC)    T2DM (type 2 diabetes mellitus) (HCC)    Tendinitis of left wrist    Thickened endometrium    Wears dentures    partial lower    Assessment: Patient Reported Symptoms:  Cognitive Cognitive Status: Alert and oriented to person, place, and time, Insightful and able to interpret abstract concepts, Normal speech and language skills Cognitive/Intellectual Conditions Management [RPT]: None reported or documented in medical history or problem list   Health Maintenance Behaviors: Sleep adequate, Annual physical exam Healing Pattern: Average Health Facilitated by: Pain control, Rest  Neurological Neurological  Review of Symptoms: No symptoms reported Neurological Management Strategies: Adequate rest, Routine screening, Medication therapy Neurological Self-Management Outcome: 4 (good) Neurological Comment: Piriformis syndrome of both sides of buttocks, Right cluneal (back) neuropathy  HEENT HEENT Symptoms Reported: Not assessed      Cardiovascular Cardiovascular Symptoms Reported: No symptoms reported Does patient have uncontrolled Hypertension?: Yes Is patient checking Blood Pressure at home?:  No Cardiovascular Conditions: Coronary artery disease, Hypertension Cardiovascular Management Strategies: Adequate rest, Medication therapy, Routine screening Weight: 178 lb 6.4 oz (80.9 kg) Cardiovascular Self-Management Outcome: 3 (uncertain)  Respiratory Respiratory Symptoms Reported: No symptoms reported Respiratory Conditions: Seasonal allergies Respiratory Self-Management Outcome: 4 (good)  Endocrine Patient reports the following symptoms related to hypoglycemia or hyperglycemia : Not assessed Is patient diabetic?: Yes    Gastrointestinal Gastrointestinal Symptoms Reported: Not assessed      Genitourinary Genitourinary Symptoms Reported: Not assessed    Integumentary Integumentary Symptoms Reported: Not assessed    Musculoskeletal Musculoskelatal Symptoms Reviewed: Difficulty walking, Muscle pain, Weakness Musculoskeletal Conditions: Back pain, Joint pain, Osteoarthritis, Rheumatoid arthritis Musculoskeletal Management Strategies: Adequate rest, Medication therapy, Routine screening Musculoskeletal Self-Management Outcome: 3 (uncertain) Falls in the past year?: No Number of falls in past year: 1 or less Was there an injury with Fall?: No Fall Risk Category Calculator: 0 Patient Fall Risk Level: Low Fall Risk Patient at Risk for Falls Due to: No Fall Risks Fall risk Follow up: Falls evaluation completed  Psychosocial Psychosocial Symptoms Reported: No symptoms reported Behavioral Management Strategies: Adequate rest, Coping strategies, Medication therapy, Support system Behavioral Health Self-Management Outcome: 4 (good) Major Change/Loss/Stressor/Fears (CP): Medical condition, self Techniques to Cope with Loss/Stress/Change: Diversional activities Quality of Family Relationships: helpful, supportive Do you feel physically threatened by others?: No      10/11/2023    3:30 PM  Depression screen PHQ 2/9  Decreased Interest 0  Down, Depressed, Hopeless 0  PHQ - 2 Score 0     There were no vitals filed for this visit.  Medications Reviewed Today     Reviewed by Cathy Berger, RN (Registered Nurse) on 10/18/23 at 1019  Med List Status: <None>   Medication Order Taking? Sig Documenting Provider Last Dose Status Informant  allopurinol  (ZYLOPRIM ) 100 MG tablet [id number]  TAKE 1 TABLET BY MOUTH EVERY DAY Aisha Hove, MD  Active Self  clotrimazole -betamethasone (LOTRISONE) cream [id number]  APPLY TWICE A DAY TO GROIN AREA SKIN FOR 1 WEEK AND THEN AS NEEDED. Aisha Hove, MD  Active   ezetimibe (ZETIA) 10 MG tablet [id number]  Take 1 tablet (10 mg total) by mouth daily. Aisha Hove, MD  Active   fluticasone  (FLONASE ) 50 MCG/ACT nasal spray [id number]  Place 2 sprays into both nostrils daily. SPRAY 2 SPRAYS INTO EACH NOSTRIL EVERY DAY Aisha Hove, MD  Active   furosemide  (LASIX ) 40 MG tablet [id number]  TAKE 1 TABLET BY MOUTH EVERY DAY Aisha Hove, MD  Active   gabapentin  (NEURONTIN ) 100 MG capsule [id number] Yes Take 1-3 capsules (100-300 mg total) by mouth at bedtime. Cephus Collin, MD Taking Active   glimepiride  (AMARYL ) 2 MG tablet [id number]  TAKE 1 TABLET (2 MG TOTAL) BY MOUTH SEE ADMIN INSTRUCTIONS. TAKE 1 MG DAILY, MAY INCREASE TO 2 MG IF BLOOD SUGAR IS OVER 175 Aisha Hove, MD  Active   hydrALAZINE  (APRESOLINE ) 100 MG tablet [id number]  TAKE 1 TABLET BY MOUTH TWICE A DAY  Patient taking differently: Take 100 mg by mouth 2 (two) times daily. Takes/1/2 tablet  BID   Cherrie Cornwall, MD  Active Self  HYDROcodone -acetaminophen  (NORCO/VICODIN) 5-325 MG tablet [id number] Yes Take 1 tablet by mouth 3 (three) times daily as needed for severe pain (pain score 7-10). Must last 30 days Cephus Collin, MD Taking Active   hydroxychloroquine  (PLAQUENIL ) 200 MG tablet [id number]  Take 200 mg by mouth daily. [provider]  Active Self           Med Note Vertie Gosling, Lester Rather   Fri Aug 20, 2016  7:20 AM)    labetalol  (NORMODYNE ) 300 MG tablet [id number]  TAKE 1  TABLET BY MOUTH TWICE A DAY Aisha Hove, MD  Active   losartan  (COZAAR ) 100 MG tablet [id number]  Take 1 tablet (100 mg total) by mouth every morning. Aisha Hove, MD  Active   methocarbamol  (ROBAXIN ) 500 MG tablet [id number]  TAKE 1 TABLET BY MOUTH EVERY 6 HOURS AS NEEDED FOR MUSCLE SPASMS. Ludwig Safer, PA-C  Active   nitroGLYCERIN  (NITROSTAT ) 0.6 MG SL tablet [id number]  TAKE 1 TABLET BY MOUTH AS NEEDED FOR CP. WAIT 5 MINUTES BEFORE NEXT DOSE. PROCEED TO ER IF NO RELIEF AFTER 3 DOSES Aisha Hove, MD  Active   pantoprazole (PROTONIX) 40 MG tablet [id number]  TAKE 1 TABLET BY MOUTH EVERY DAY Aisha Hove, MD  Active   rosuvastatin  (CRESTOR ) 40 MG tablet [id number]  TAKE 1 TABLET BY MOUTH EVERY DAY Scoggins, Amber, NP  Active   senna (SENOKOT) 8.6 MG TABS tablet [id number]  Take 1 tablet (8.6 mg total) by mouth daily as needed for mild constipation. Noble Bateman, [place]  Active   SYMBICORT 80-4.5 MCG/ACT inhaler [id number]  INHALE 1 PUFF BY MOUTH TWICE A DAY Aisha Hove, MD  Active   traMADol  (ULTRAM ) 50 MG tablet [id number]  Take 1 tablet (50 mg total) by mouth every 6 (six) hours as needed for moderate pain (pain score 4-6).  Patient not taking: Reported on 10/11/2023   Jodeen Munch, MD  Active   tretinoin (RETIN-A) 0.05 % cream [id number]  APPLY TO AFFECTED AREA EVERY DAY AT BEDTIME Scoggins, Amber, NP  Active   zolpidem  (AMBIEN ) 5 MG tablet [id number]  TAKE 1 TABLET BY MOUTH EVERY DAY AT BEDTIME AS NEEDED FOR SLEEP Aisha Hove, MD  Active   Med List Note Merilyn Staple, RN 09/26/23 1448): 08-04-23 UDS MR 10/26/23            Recommendation:   PCP Follow-up Continue follow up visits with your pain management doctor  Follow Up Plan:   Telephone follow up appointment date/time:  11/10/23/ 3 pm  Soraya Paquette L. Mcarthur Speedy, RN, BSN, CCM [place]  [facility], [facility] RN Care Manager Direct Dial: [phone]  Fax: [phone]

## 2023-10-18 NOTE — Patient Instructions (Signed)
 Visit Information  Thank you for taking time to visit with me today. Please don't hesitate to contact me if I can be of assistance to you before our next scheduled appointment.  Our next appointment is by telephone on 11/10/23 at 3 pm Please call the care guide team at [phone] if you need to cancel or reschedule your appointment.   Following is a copy of your care plan:   Goals Addressed             This Visit's Progress    COMPLETED: Patient Stated:  " I want to manage my arthritis pain and health conditions"       Duplicate goal Refer to [facility] RN Care plan  Interventions Today    Flowsheet Row Most Recent Value  Chronic Disease   Chronic disease during today's visit Other  [status post lumbar laminectomy, ongoing back/ buttock pain), UTI, chronic calculous cholecystitis, CKD IV]  General Interventions   General Interventions Discussed/Reviewed General Interventions Reviewed, Doctor Visits  [evaluation of current treatment plan for listed health conditions and patients adherence to plan as established by provider.  Assessed pain level, assessed for ongoing UTI symptoms]  Doctor Visits Discussed/Reviewed Doctor Visits Reviewed  Carin [place] upcoming provider visits. Advised to keep follow up visits with providers as recommended.]  Exercise Interventions   Exercise Discussed/Reviewed Physical Activity  [Assessed patients current activity level.]  Education Interventions   Education Provided Provided Education  [Advised to avoid NSAID to maintain kidney health. Advised to stay hydrated Advised to notify provider for increase in UTI symptoms or symptoms related to gallstones. Patient informed she will be transfered to new [facility]  case manager]  Provided Verbal Education On Other  [Discussed non pharmacologic pain management treatments.]  Pharmacy Interventions   Pharmacy Dicussed/Reviewed Pharmacy Topics Reviewed  [Medications reviewed and compliance discussed and recommended.]            [facility] RN Care Plan -Patient Stated: " I want to manage my arthritis pain and health conditions"   On track    Problems:  Chronic Disease Management support and education needs related to CKD Stage IV, DMII, HTN, Osteoarthritis, and  systemic lupus erythematosus  Goal: Over the next 60 days the Patient will demonstrate ongoing self health care management ability to manage her pain and other major medical conditions as evidenced by    EMR documentation   Interventions:    Chronic Kidney Disease Interventions: Evaluation of current treatment plan related to chronic kidney disease self management and patient's adherence to plan as established by provider      Reviewed medications with patient and discussed importance of compliance    Reviewed scheduled/upcoming provider appointments including    Discussed plans with patient for ongoing care management follow up and provided patient with direct contact information for care management team    Screening for signs and symptoms of depression related to chronic disease state      Assessed social determinant of health barriers    Last practice recorded BP readings:  BP Readings from Last 3 Encounters:  10/11/23 (!) 160/108  09/26/23 (!) 159/67  08/24/23 (!) 171/84   Most recent eGFR/CrCl:  Lab Results  Component Value Date   EGFR 15 (L) 10/11/2023    No components found for: "CRCL"    Diabetes Interventions: Assessed patient's understanding of A1c goal: <7% Reviewed medications with patient and discussed importance of medication adherence Counseled on importance of regular laboratory monitoring as prescribed Review of patient status, including review  of consultants reports, relevant laboratory and other test results, and medications completed Lab Results  Component Value Date   HGBA1C 6.1 (H) 10/11/2023    Hypertension Interventions: Last practice recorded BP readings:  BP Readings from Last 3 Encounters:  10/11/23 (!) 160/108   09/26/23 (!) 159/67  08/24/23 (!) 171/84   Most recent eGFR/CrCl:  Lab Results  Component Value Date   EGFR 15 (L) 10/11/2023    No components found for: "CRCL"  Evaluation of current treatment plan related to hypertension self management and patient's adherence to plan as established by provider  Pain Interventions: Pain assessment performed Medications reviewed Reviewed provider established plan for pain management Discussed importance of adherence to all scheduled medical appointments Counseled on the importance of reporting any/all new or changed pain symptoms or management strategies to pain management provider  Patient Self-Care Activities:  Attend all scheduled provider appointments Call pharmacy for medication refills 3-7 days in advance of running out of medications Call provider office for new concerns or questions  Take medications as prescribed    Plan:  Telephone follow up appointment with care management team member scheduled for:  11/10/23 The patient has been provided with contact information for the care management team and has been advised to call with any health related questions or concerns.              Please call the Suicide and Crisis Lifeline: 988 call the [place]  National Suicide Prevention Lifeline: (540) 518-9852 or TTY: 820-395-4545 TTY 541-666-3502) to talk to a trained counselor call 1-800-273-TALK (toll free, 24 hour hotline) go to [facility] [address] [place] [id number]) call 911 if you are experiencing a Mental Health or Behavioral Health Crisis or need someone to talk to.  Patient verbalizes understanding of instructions and care plan provided today and agrees to view in MyChart. Active MyChart status and patient understanding of how to access instructions and care plan via MyChart confirmed with patient.      Alleya Demeter L. Mcarthur Speedy, RN, BSN, CCM   [facility],  [facility] RN Care Manager Direct Dial: [phone]  Fax: [phone]

## 2023-10-18 NOTE — Progress Notes (Signed)
 Established Patient Office Visit  Subjective:  Patient ID: [name], female    DOB: 02/25/1946  Age: 78 y.o. MRN: [id number]  Chief Complaint  Patient presents with   Follow-up    1 week follow up    Patient comes in for follow-up on brings in all her medications.  Today her blood pressure looks better.  She continues to feel tired and sleepy.  Her most recent labs show a jump in her creatinine, she will see her nephrologist within this week, call to make an appointment.  Patient advised to cut back on her Ambien  to every other day which she says she absolutely needs to help her sleep at night.  Says that she is not taking her oxycodone  daily. Patient also has an appointment with her surgeon to discuss her gallbladder, and her pancreatic cyst will also be addressed at the same time.  She is not having any abdominal pain, no nausea or vomiting, no diarrhea or constipation at this time.    No other concerns at this time.   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 b.)07/18/2006 -endovas oblit complex ACOM aneur. c.) Endovas Tx of 6.8x40mm RMCA aneur. d.) 3.5x27mm remnant RMCA aneur 2/2 coil compaction. e.) Interval 3.2x3.31mm saccular outpouching in ACOM c/w mild recannulization in neck. 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 g.) 3.7x17mm remnant of prev Tx'd ACOM aneur -failed embol 02/24/2012.   CKD (chronic kidney disease), stage IV (HCC)    a.) solitary functioning kidney on the RIGHT   Complication of anesthesia    a.) delayed emergence   Coronary artery disease 02/23/2007   a.) LHC 02/23/2007 --> EF 50%; 30% pLAD, 70% mRCA --> planned for staged PCI. b.) PCI 02/27/2007: EF 60%; 3.5 x 15 mm Vision BMS to 80% mRCA. b.) LHC 07/24/2007: EF 60%; minor irregs; no occlusive CAD; no intervention. c.) LHC 10/28/2010: EF 60%; 30% mLAD, LCx with  minor luminal irregs, 20% ISR mRCA; no intervention. d.) LHC 05/31/2013: EF 60%; 40% mLAD, 30% ISR m-dRCA; no interventions.   DDD (degenerative disc disease), cervical    a.) s/p ACDF C5-C7; hardware in neck; patient appreciates stiffness and issues with mobility   Diastolic dysfunction    a.)  TTE 07/07/2021: EF 87.8%; normal PASP; trace TR/MR; G1DD.   Elevated LEFT hemidiaphragm    Gout    Headache(784.0)    Heart murmur    HLD (hyperlipidemia)    HOH (hard of hearing)    Has hearing aids, doesn't wear   Hyperkalemia    Hyperparathyroidism due to renal insufficiency (HCC)    Hypertension    Incomplete right bundle branch block (RBBB)    Insomnia    a.) takes zolpidem    Long term (current) use of immunomodulator    a.) on DMARD therapy (hydroxychloroquine ) for RA/SLE Dx.   Lumbar adjacent segment disease with spondylolisthesis    Multiple acquired cysts of kidney    On chronic clopidogrel  therapy    OSA (obstructive sleep apnea)    a.) does NOT use nocturnal pap therapy   Osteoarthritis    Pancreatic cyst    Peripheral vascular disease (HCC)    Pneumonia    Post-COVID chronic cough    Pulmonary nodule    RBBB (right bundle branch block)    Rectal bleeding    Rheumatoid  arthritis (HCC)    a.) on DMARD; hydroxychloriquine   Shortness of breath 03/01/2023   Systemic lupus erythematosus (HCC)    T2DM (type 2 diabetes mellitus) (HCC)    Tendinitis of left wrist    Thickened endometrium    Wears dentures    partial lower    Past Surgical History:  Procedure Laterality Date   ANEURYSM COILING  08/2013   ANTERIOR LATERAL LUMBAR FUSION WITH PERCUTANEOUS SCREW 1 LEVEL N/A 03/09/2023   Procedure: L3-4 LATERAL LUMBAR INTERBODY FUSION;  Surgeon: Jodeen Munch, MD;  Location: [facility];  Service: Neurosurgery;  Laterality: N/A;   APPLICATION OF INTRAOPERATIVE CT SCAN N/A 03/09/2023   Procedure: APPLICATION OF INTRAOPERATIVE CT SCAN;  Surgeon: Jodeen Munch, MD;  Location:  [facility];  Service: Neurosurgery;  Laterality: N/A;   Attempted embolization of previously treated ACOM aneurysm; unsuccessful N/A 01/28/2012   Location: [facility]   BACK SURGERY     BARTHOLIN CYST MARSUPIALIZATION     BICEPT TENODESIS Right 03/02/2022   Procedure: BICEPS TENODESIS;  Surgeon: Elner Hahn, MD;  Location: [facility];  Service: Orthopedics;  Laterality: Right;   BREAST CYST ASPIRATION Right 01/25/2012   FNA neg.   BREAST EXCISIONAL BIOPSY Left 06/26/2007   neg   BUNIONECTOMY Bilateral 2022   CARDIAC CATHETERIZATION Left 02/23/2007   Procedure: CARDIAC CATHETERIZATION; Location: [facility]; Surgeon: Debborah Fairly, MD   CARDIAC CATHETERIZATION Left 07/31/2007   Procedure: CARDIAC CATHETERIZATION; Location: [facility]; Surgeon: Debborah Fairly, MD   CARDIAC CATHETERIZATION Left 10/28/2010   Procedure: CARDIAC CATHETERIZATION; Location: [facility]; Surgeon: Debborah Fairly, MD   CARDIAC CATHETERIZATION Left 05/31/2013   Procedure: CARDIAC CATHETERIZATION; Location: [facility]; Surgeon: Debborah Fairly, MD   Carotid arteriogram; 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 N/A 07/18/2006   Location: [facility]   CATARACT EXTRACTION     CERVICAL FUSION N/A    Procedure: ACDF C5-C7   COLONOSCOPY WITH PROPOFOL  N/A 10/04/2019   Procedure: COLONOSCOPY WITH BIOPSY;  Surgeon: Selena Daily, MD;  Location: [facility];  Service: Endoscopy;  Laterality: N/A;  Diabetic (borderline) - oral meds priority 3   CORONARY STENT INTERVENTION Left 02/27/2007   Procedure: CORONARY STENT INTERVENTION (3.5 x 15 mm Vision BMS to mRCA); Location: [facility]; Surgeon: Antionette Kirks, MD   Endovascular near complete obliteration of enlarging neck remnant of previously treated RIGHT MCA aneurysm using stent assisted coiling N/A 09/03/2010   Location: [facility]   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 N/A 10/03/2006   Location: [facility]   HYSTEROSCOPY WITH D & C N/A 08/04/2021   Procedure: DILATATION AND CURETTAGE /HYSTEROSCOPY;  Surgeon: Heron Lord, MD;  Location: [facility];  Service: Gynecology;  Laterality: N/A;   POLYPECTOMY N/A 10/04/2019   Procedure: POLYPECTOMY;  Surgeon: Selena Daily, MD;  Location: [facility];  Service: Endoscopy;  Laterality: N/A;   REVERSE SHOULDER ARTHROPLASTY Right 03/02/2022   Procedure: REVERSE SHOULDER ARTHROPLASTY;  Surgeon: Elner Hahn, MD;  Location: [facility];  Service: Orthopedics;  Laterality: Right;   UPPER ESOPHAGEAL ENDOSCOPIC ULTRASOUND (EUS) N/A 04/15/2016   Procedure: UPPER ESOPHAGEAL ENDOSCOPIC ULTRASOUND (EUS);  Surgeon: Eloisa Hait, MD;  Location: [facility];  Service: Gastroenterology;  Laterality: N/A;    Social History   Socioeconomic History   Marital status: Widowed    Spouse name: Not on file   Number of children: Not on file   Years of education: Not on file   Highest  education level: Not on file  Occupational History   Not on file  Tobacco Use   Smoking status: Never    Passive exposure: Never   Smokeless tobacco: Never  Vaping Use   Vaping status: Never Used  Substance and Sexual Activity   Alcohol use: Yes    Comment: Occasionally at christmas   Drug use: No   Sexual activity: Not Currently    Birth control/protection: Post-menopausal  Other Topics Concern   Not on file  Social History Narrative   Lives with daughter   Social Drivers of Health   Financial Resource Strain: Low Risk  (10/11/2023)   Overall Financial Resource Strain (CARDIA)    Difficulty of Paying Living Expenses: Not hard at all  Food Insecurity: No Food Insecurity (10/11/2023)   Hunger Vital Sign    Worried About Running Out of Food in the Last Year: Never true    Ran Out of Food in the Last Year: Never true  Transportation Needs: No Transportation Needs (10/11/2023)   PRAPARE - Administrator, Civil Service (Medical):  No    Lack of Transportation (Non-Medical): No  Physical Activity: Not on file  Stress: No Stress Concern Present (10/11/2023)   [employer] of Occupational Health - Occupational Stress Questionnaire    Feeling of Stress : Not at all  Social Connections: Moderately Isolated (10/11/2023)   Social Connection and Isolation Panel [NHANES]    Frequency of Communication with Friends and Family: More than three times a week    Frequency of Social Gatherings with Friends and Family: More than three times a week    Attends Religious Services: Never    Database administrator or Organizations: Yes    Attends Banker Meetings: Never    Marital Status: Widowed  Intimate Partner Violence: Not At Risk (10/11/2023)   Humiliation, Afraid, Rape, and Kick questionnaire    Fear of Current or Ex-Partner: No    Emotionally Abused: No    Physically Abused: No    Sexually Abused: No    Family History  Problem Relation Age of Onset   Breast cancer Paternal Aunt 27   Breast cancer Paternal Aunt    Heart disease Mother    Heart disease Father    Stroke Father    Diabetes Sister    Diabetes Brother     Allergies  Allergen Reactions   Isosorbide Dinitrate Other (See Comments)    Collapse   Amlodipine Swelling   Sulfa Antibiotics Itching   Tizanidine Hcl    Oxycodone  Itching   Sulfasalazine Itching    Outpatient Medications Prior to Visit  Medication Sig   allopurinol  (ZYLOPRIM ) 100 MG tablet TAKE 1 TABLET BY MOUTH EVERY DAY   clotrimazole -betamethasone (LOTRISONE) cream APPLY TWICE A DAY TO GROIN AREA SKIN FOR 1 WEEK AND THEN AS NEEDED.   ezetimibe (ZETIA) 10 MG tablet Take 1 tablet (10 mg total) by mouth daily.   fluticasone  (FLONASE ) 50 MCG/ACT nasal spray Place 2 sprays into both nostrils daily. SPRAY 2 SPRAYS INTO EACH NOSTRIL EVERY DAY   furosemide  (LASIX ) 40 MG tablet TAKE 1 TABLET BY MOUTH EVERY DAY   gabapentin  (NEURONTIN ) 100 MG capsule Take 1-3 capsules (100-300 mg  total) by mouth at bedtime.   glimepiride  (AMARYL ) 2 MG tablet TAKE 1 TABLET (2 MG TOTAL) BY MOUTH SEE ADMIN INSTRUCTIONS. TAKE 1 MG DAILY, MAY INCREASE TO 2 MG IF BLOOD SUGAR IS OVER 175   hydrALAZINE  (APRESOLINE ) 100 MG tablet  TAKE 1 TABLET BY MOUTH TWICE A DAY (Patient taking differently: Take 100 mg by mouth 2 (two) times daily. Takes/1/2 tablet BID)   HYDROcodone -acetaminophen  (NORCO/VICODIN) 5-325 MG tablet Take 1 tablet by mouth 3 (three) times daily as needed for severe pain (pain score 7-10). Must last 30 days   hydroxychloroquine  (PLAQUENIL ) 200 MG tablet Take 200 mg by mouth daily.   labetalol  (NORMODYNE ) 300 MG tablet TAKE 1 TABLET BY MOUTH TWICE A DAY   losartan  (COZAAR ) 100 MG tablet Take 1 tablet (100 mg total) by mouth every morning.   methocarbamol  (ROBAXIN ) 500 MG tablet TAKE 1 TABLET BY MOUTH EVERY 6 HOURS AS NEEDED FOR MUSCLE SPASMS.   nitroGLYCERIN  (NITROSTAT ) 0.6 MG SL tablet TAKE 1 TABLET BY MOUTH AS NEEDED FOR CP. WAIT 5 MINUTES BEFORE NEXT DOSE. PROCEED TO ER IF NO RELIEF AFTER 3 DOSES   pantoprazole (PROTONIX) 40 MG tablet TAKE 1 TABLET BY MOUTH EVERY DAY   rosuvastatin  (CRESTOR ) 40 MG tablet TAKE 1 TABLET BY MOUTH EVERY DAY   senna (SENOKOT) 8.6 MG TABS tablet Take 1 tablet (8.6 mg total) by mouth daily as needed for mild constipation.   SYMBICORT 80-4.5 MCG/ACT inhaler INHALE 1 PUFF BY MOUTH TWICE A DAY   traMADol  (ULTRAM ) 50 MG tablet Take 1 tablet (50 mg total) by mouth every 6 (six) hours as needed for moderate pain (pain score 4-6).   tretinoin (RETIN-A) 0.05 % cream APPLY TO AFFECTED AREA EVERY DAY AT BEDTIME   zolpidem  (AMBIEN ) 5 MG tablet TAKE 1 TABLET BY MOUTH EVERY DAY AT BEDTIME AS NEEDED FOR SLEEP   No facility-administered medications prior to visit.    Review of Systems  Constitutional:  Positive for malaise/fatigue. Negative for chills, fever and weight loss.  HENT: Negative.  Negative for sore throat.   Eyes: Negative.   Respiratory: Negative.   Negative for cough and shortness of breath.   Cardiovascular: Negative.  Negative for chest pain, palpitations and leg swelling.  Gastrointestinal: Negative.  Negative for abdominal pain, constipation, diarrhea, heartburn, nausea and vomiting.  Genitourinary: Negative.  Negative for dysuria and flank pain.  Musculoskeletal: Negative.  Negative for joint pain and myalgias.  Skin: Negative.   Neurological: Negative.  Negative for dizziness, tingling, tremors, sensory change and headaches.  Endo/Heme/Allergies: Negative.   Psychiatric/Behavioral: Negative.  Negative for depression and suicidal ideas. The patient is not nervous/anxious.        Objective:   BP 138/88   Pulse 73   Ht 5\' 3"  (1.6 m)   Wt 180 lb (81.6 kg)   SpO2 95%   BMI 31.89 kg/m   Vitals:   10/18/23 1303  BP: 138/88  Pulse: 73  Height: 5\' 3"  (1.6 m)  Weight: 180 lb (81.6 kg)  SpO2: 95%  BMI (Calculated): 31.89    Physical Exam Vitals and nursing note reviewed.  Constitutional:      Appearance: Normal appearance.  HENT:     Head: Normocephalic and atraumatic.     Nose: Nose normal.     Mouth/Throat:     Mouth: Mucous membranes are moist.     Pharynx: Oropharynx is clear.  Eyes:     Conjunctiva/sclera: Conjunctivae normal.     Pupils: Pupils are equal, round, and reactive to light.  Cardiovascular:     Rate and Rhythm: Normal rate and regular rhythm.     Pulses: Normal pulses.     Heart sounds: Normal heart sounds. No murmur heard. Pulmonary:     Effort:  Pulmonary effort is normal.     Breath sounds: Normal breath sounds. No wheezing.  Abdominal:     General: Bowel sounds are normal.     Palpations: Abdomen is soft.     Tenderness: There is no abdominal tenderness. There is no right CVA tenderness or left CVA tenderness.  Musculoskeletal:        General: Normal range of motion.     Cervical back: Normal range of motion.     Right lower leg: No edema.     Left lower leg: No edema.  Skin:     General: Skin is warm and dry.  Neurological:     General: No focal deficit present.     Mental Status: She is alert and oriented to person, place, and time.  Psychiatric:        Mood and Affect: Mood normal.        Behavior: Behavior normal.      Results for orders placed or performed in visit on 10/18/23  POCT CBG (Fasting - Glucose)  Result Value Ref Range   Glucose Fasting, POC 147 (A) 70 - 99 mg/dL    Recent Results (from the past 2160 hours)  Compliance Drug Analysis, Ur     Status: None   Collection Time: 08/04/23  3:03 PM  Result Value Ref Range   Summary FINAL     Comment: ==================================================================== Compliance Drug Analysis, Ur ==================================================================== Test                             Result       Flag       Units  Drug Present and Declared for Prescription Verification   Gabapentin                      PRESENT      EXPECTED   Methocarbamol                   PRESENT      EXPECTED   Zolpidem                        PRESENT      EXPECTED   Zolpidem  Acid                  PRESENT      EXPECTED    Zolpidem  acid is an expected metabolite of zolpidem .  Drug Absent but Declared for Prescription Verification   Tramadol                        Not Detected UNEXPECTED ng/mg creat ==================================================================== Test                      Result    Flag   Units      Ref Range   Creatinine              187              mg/dL      >=54 ==================================================================== Declared Medications:  The flagg ing and interpretation on this report are based on the  following declared medications.  Unexpected results may arise from  inaccuracies in the declared medications.   **Note: The testing scope of this panel includes these medications:   Gabapentin  (Neurontin )  Methocarbamol  (Robaxin )  Tramadol    **Note: The testing scope of  this  panel does not include small to  moderate amounts of these reported medications:   Zolpidem  (Ambien )   **Note: The testing scope of this panel does not include the  following reported medications:   Allopurinol  (Zyloprim )  Betamethasone (Lotrisone)  Budesonide (Symbicort)  Clotrimazole  (Lotrisone)  Fluticasone  (Flonase )  Formoterol  (Symbicort)  Furosemide  (Lasix )  Glimepiride  (Amaryl )  Hydralazine  (Apresoline )  Hydroxychloroquine  (Plaquenil )  Losartan  (Cozaar )  Nitroglycerin  (Nitrostat )  Normodyne   Pantoprazole (Protonix)  Rosuvastatin  (Crestor )  Sennosides (Senokot)  Sodium Zirconium Cyclosilicate (Lokelma)  Tretinoin (Retin -A) ==================================================================== For clinical consultation, please call [phone]. ====================================================================   POCT CBG (Fasting - Glucose)     Status: Abnormal   Collection Time: 08/05/23  1:11 PM  Result Value Ref Range   Glucose Fasting, POC 171 (A) 70 - 99 mg/dL  POCT Urinalysis Dipstick (95284)     Status: Abnormal   Collection Time: 08/05/23  1:40 PM  Result Value Ref Range   Color, UA yellow    Clarity, UA cloudy    Glucose, UA Negative Negative   Bilirubin, UA neg    Ketones, UA neg    Spec Grav, UA 1.025 1.010 - 1.025   Blood, UA neg    pH, UA 5.5 5.0 - 8.0   Protein, UA Positive (A) Negative   Urobilinogen, UA 0.2 0.2 or 1.0 E.U./dL   Nitrite, UA pos    Leukocytes, UA Small (1+) (A) Negative   Appearance cloudy    Odor yes   CMP14+EGFR     Status: Abnormal   Collection Time: 08/05/23  2:08 PM  Result Value Ref Range   Glucose 114 (H) 70 - 99 mg/dL   BUN 41 (H) 8 - 27 mg/dL   Creatinine, Ser 1.32 (H) 0.57 - 1.00 mg/dL   eGFR 19 (L) >44 WN/UUV/2.53   BUN/Creatinine Ratio 16 12 - 28   Sodium 148 (H) 134 - 144 mmol/L   Potassium 5.2 3.5 - 5.2 mmol/L   Chloride 113 (H) 96 - 106 mmol/L   CO2 22 20 - 29 mmol/L   Calcium  9.6 8.7 - 10.3  mg/dL   Total Protein 5.9 (L) 6.0 - 8.5 g/dL   Albumin  3.9 3.8 - 4.8 g/dL   Globulin, Total 2.0 1.5 - 4.5 g/dL   Bilirubin Total 0.7 0.0 - 1.2 mg/dL   Alkaline Phosphatase 81 44 - 121 IU/L   AST 13 0 - 40 IU/L   ALT 6 0 - 32 IU/L  Urine Culture     Status: Abnormal   Collection Time: 08/05/23  3:45 PM   Specimen: Urine   UR  Result Value Ref Range   Urine Culture, Routine Final report (A)    Organism ID, Bacteria Escherichia coli (A)     Comment: Cefazolin  with an MIC <=16 predicts susceptibility to the oral agents cefaclor, cefdinir , cefpodoxime, cefprozil, cefuroxime, cephalexin , and loracarbef when used for therapy of uncomplicated urinary tract infections due to E. coli, Klebsiella pneumoniae, and Proteus mirabilis. Multi-Drug Resistant Organism Greater than 100,000 colony forming units per mL    Antimicrobial Susceptibility Comment     Comment:       ** S = Susceptible; I = Intermediate; R = Resistant **                    P = Positive; N = Negative             MICS are expressed in micrograms per mL    Antibiotic  RSLT#1    RSLT#2    RSLT#3    RSLT#4 Amoxicillin /Clavulanic Acid    S Ampicillin                     R Cefazolin                       S Cefepime                       S Cefoxitin                      S Cefpodoxime                    S Ceftriaxone                    S Ciprofloxacin                   R Ertapenem                      S Gentamicin                     S Levofloxacin                    R Meropenem                      S Nitrofurantoin                 S Piperacillin/Tazobactam        S Tetracycline                   R Tobramycin                     S Trimethoprim/Sulfa             R   CMP14+EGFR     Status: Abnormal   Collection Time: 10/11/23  1:56 PM  Result Value Ref Range   Glucose 101 (H) 70 - 99 mg/dL   BUN 57 (H) 8 - 27 mg/dL   Creatinine, Ser 1.61 (H) 0.57 - 1.00 mg/dL   eGFR 15 (L) >09 UE/AVW/0.98   BUN/Creatinine  Ratio 19 12 - 28   Sodium 148 (H) 134 - 144 mmol/L   Potassium 4.2 3.5 - 5.2 mmol/L   Chloride 112 (H) 96 - 106 mmol/L   CO2 21 20 - 29 mmol/L   Calcium  9.2 8.7 - 10.3 mg/dL   Total Protein 5.9 (L) 6.0 - 8.5 g/dL   Albumin  3.9 3.8 - 4.8 g/dL   Globulin, Total 2.0 1.5 - 4.5 g/dL   Bilirubin Total 0.4 0.0 - 1.2 mg/dL   Alkaline Phosphatase 80 44 - 121 IU/L   AST 15 0 - 40 IU/L   ALT 11 0 - 32 IU/L  CBC with Diff     Status: Abnormal   Collection Time: 10/11/23  1:56 PM  Result Value Ref Range   WBC 5.9 3.4 - 10.8 x10E3/uL   RBC 3.47 (L) 3.77 - 5.28 x10E6/uL   Hemoglobin 10.3 (L) 11.1 - 15.9 g/dL   Hematocrit 11.9 (L) 14.7 - 46.6 %   MCV 92 79 - 97 fL   MCH 29.7 26.6 - 33.0 pg   MCHC 32.2 31.5 - 35.7 g/dL   RDW 82.9 56.2 -  15.4 %   Platelets 188 150 - 450 x10E3/uL   Neutrophils 59 Not Estab. %   Lymphs 31 Not Estab. %   Monocytes 5 Not Estab. %   Eos 4 Not Estab. %   Basos 1 Not Estab. %   Neutrophils Absolute 3.5 1.4 - 7.0 x10E3/uL   Lymphocytes Absolute 1.8 0.7 - 3.1 x10E3/uL   Monocytes Absolute 0.3 0.1 - 0.9 x10E3/uL   EOS (ABSOLUTE) 0.2 0.0 - 0.4 x10E3/uL   Basophils Absolute 0.0 0.0 - 0.2 x10E3/uL   Immature Granulocytes 0 Not Estab. %   Immature Grans (Abs) 0.0 0.0 - 0.1 x10E3/uL  Lipid Panel w/o Chol/HDL Ratio     Status: Abnormal   Collection Time: 10/11/23  1:56 PM  Result Value Ref Range   Cholesterol, Total 279 (H) 100 - 199 mg/dL   Triglycerides 161 (H) 0 - 149 mg/dL   HDL 38 (L) >09 mg/dL   VLDL Cholesterol Cal 57 (H) 5 - 40 mg/dL   LDL Chol Calc (NIH) 604 (H) 0 - 99 mg/dL  Hemoglobin V4U     Status: Abnormal   Collection Time: 10/11/23  1:56 PM  Result Value Ref Range   Hgb A1c MFr Bld 6.1 (H) 4.8 - 5.6 %    Comment:          Prediabetes: 5.7 - 6.4          Diabetes: >6.4          Glycemic control for adults with diabetes: <7.0    Est. average glucose Bld gHb Est-mCnc 128 mg/dL  POCT CBG (Fasting - Glucose)     Status: Abnormal   Collection Time:  10/18/23  1:07 PM  Result Value Ref Range   Glucose Fasting, POC 147 (A) 70 - 99 mg/dL      Assessment & Plan:  Continue current medications.  Try and reduce intake of Ambien  and oxycodone . Keep her follow-up appointments with the nephrologist and general surgeon. Strict diet control emphasized. Problem List Items Addressed This Visit     Rheumatoid arthritis involving multiple sites with positive rheumatoid factor (HCC)   Type 2 diabetes mellitus with hyperglycemia, without long-term current use of insulin  (HCC)   Relevant Orders   POCT CBG (Fasting - Glucose) (Completed)   Hypertension associated with diabetes (HCC) - Primary   Combined hyperlipidemia associated with type 2 diabetes mellitus (HCC)   Chronic kidney disease, stage IV (severe) (HCC)   Multiple gallstones    Return in about 3 months (around 01/17/2024).   Total time spent: 30 minutes  Aisha Hove, MD  10/18/2023   This document may have been prepared by [facility] Voice Recognition software and as such may include unintentional dictation errors.

## 2023-10-19 DIAGNOSIS — E119 Type 2 diabetes mellitus without complications: Secondary | ICD-10-CM | POA: Diagnosis not present

## 2023-10-22 ENCOUNTER — Other Ambulatory Visit: Payer: Self-pay | Admitting: Internal Medicine

## 2023-10-22 DIAGNOSIS — F5101 Primary insomnia: Secondary | ICD-10-CM

## 2023-10-24 ENCOUNTER — Other Ambulatory Visit: Payer: Self-pay

## 2023-10-24 DIAGNOSIS — F5101 Primary insomnia: Secondary | ICD-10-CM

## 2023-10-25 ENCOUNTER — Other Ambulatory Visit: Payer: Self-pay

## 2023-10-25 DIAGNOSIS — F5101 Primary insomnia: Secondary | ICD-10-CM

## 2023-10-25 MED ORDER — ZOLPIDEM TARTRATE 5 MG PO TABS: 5.0000 mg | ORAL_TABLET | Freq: Every evening | ORAL | 0 refills | Status: DC | PRN

## 2023-10-26 DIAGNOSIS — N184 Chronic kidney disease, stage 4 (severe): Secondary | ICD-10-CM | POA: Diagnosis not present

## 2023-10-26 DIAGNOSIS — I10 Essential (primary) hypertension: Secondary | ICD-10-CM | POA: Diagnosis not present

## 2023-10-26 DIAGNOSIS — N2581 Secondary hyperparathyroidism of renal origin: Secondary | ICD-10-CM | POA: Diagnosis not present

## 2023-10-26 DIAGNOSIS — D631 Anemia in chronic kidney disease: Secondary | ICD-10-CM | POA: Diagnosis not present

## 2023-10-26 DIAGNOSIS — I129 Hypertensive chronic kidney disease with stage 1 through stage 4 chronic kidney disease, or unspecified chronic kidney disease: Secondary | ICD-10-CM | POA: Diagnosis not present

## 2023-10-26 DIAGNOSIS — E1122 Type 2 diabetes mellitus with diabetic chronic kidney disease: Secondary | ICD-10-CM | POA: Diagnosis not present

## 2023-10-26 DIAGNOSIS — N189 Chronic kidney disease, unspecified: Secondary | ICD-10-CM | POA: Diagnosis not present

## 2023-11-04 ENCOUNTER — Ambulatory Visit: Payer: 59 | Admitting: Internal Medicine

## 2023-11-10 ENCOUNTER — Other Ambulatory Visit: Payer: Self-pay | Admitting: *Deleted

## 2023-11-10 NOTE — Patient Outreach (Signed)
 Complex Care Management   Visit Note  12/08/2023 updated noted for 11/10/23  Name:  [name] MRN: [id number] DOB: 21-Apr-1946  Situation: Referral received for Complex Care Management related to Chronic Kidney Disease I obtained verbal consent from Patient.  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 b.)07/18/2006 -endovas oblit complex ACOM aneur. c.) Endovas Tx of 6.8x64mm RMCA aneur. d.) 3.5x53mm remnant RMCA aneur 2/2 coil compaction. e.) Interval 3.2x3.52mm saccular outpouching in ACOM c/w mild recannulization in neck. 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 g.) 3.7x43mm remnant of prev Tx'd ACOM aneur -failed embol 02/24/2012.   CKD (chronic kidney disease), stage IV (HCC)    a.) solitary functioning kidney on the RIGHT   Complication of anesthesia    a.) delayed emergence   Coronary artery disease 02/23/2007   a.) LHC 02/23/2007 --> EF 50%; 30% pLAD, 70% mRCA --> planned for staged PCI. b.) PCI 02/27/2007: EF 60%; 3.5 x 15 mm Vision BMS to 80% mRCA. b.) LHC 07/24/2007: EF 60%; minor irregs; no occlusive CAD; no intervention. c.) LHC 10/28/2010: EF 60%; 30% mLAD, LCx with minor luminal irregs, 20% ISR mRCA; no intervention. d.) LHC 05/31/2013: EF 60%; 40% mLAD, 30% ISR m-dRCA; no interventions.   DDD (degenerative disc disease), cervical    a.) s/p ACDF C5-C7; hardware in neck; patient appreciates stiffness and issues with mobility   Diastolic dysfunction    a.)  TTE 07/07/2021: EF 87.8%; normal PASP; trace TR/MR; G1DD.   Elevated LEFT hemidiaphragm    Gout    Headache(784.0)    Heart murmur    HLD (hyperlipidemia)    HOH (hard of hearing)    Has hearing aids, doesn't wear   Hyperkalemia    Hyperparathyroidism due to renal insufficiency (HCC)    Hypertension     Incomplete right bundle branch block (RBBB)    Insomnia    a.) takes zolpidem    Long term (current) use of immunomodulator    a.) on DMARD therapy (hydroxychloroquine ) for RA/SLE Dx.   Lumbar adjacent segment disease with spondylolisthesis    Multiple acquired cysts of kidney    On chronic clopidogrel  therapy    OSA (obstructive sleep apnea)    a.) does NOT use nocturnal pap therapy   Osteoarthritis    Pancreatic cyst    Peripheral vascular disease (HCC)    Pneumonia    Post-COVID chronic cough    Pulmonary nodule    RBBB (right bundle branch block)    Rectal bleeding    Rheumatoid arthritis (HCC)    a.) on DMARD; hydroxychloriquine   Shortness of breath 03/01/2023   Systemic lupus erythematosus (HCC)    T2DM (type 2 diabetes mellitus) (HCC)    Tendinitis of left wrist    Thickened endometrium    Wears dentures    partial lower    Assessment: Patient Reported Symptoms:  Cognitive Cognitive Status: Alert and oriented to person, place, and time, Normal speech and language skills, Insightful and able to interpret abstract concepts Cognitive/Intellectual Conditions Management [RPT]: None reported or documented in medical history or problem list   Health Maintenance Behaviors: Annual physical exam, Healthy diet, Sleep adequate, Social activities Healing Pattern: Average Health Facilitated by: Pain control, Rest  Neurological Neurological Review of Symptoms: Headaches  Neurological Management Strategies: Medication therapy, Routine screening Neurological Self-Management Outcome: 4 (good)  HEENT HEENT Symptoms Reported: Change or loss of hearing HEENT Conditions: Tooth problem(s), Ear problem(s) Tooth Problems: missing (wears dentures) HEENT Management Strategies: Medical device, Routine screening HEENT Self-Management Outcome: 4 (good) HEENT Comment: does not always wear hearing aid Tooth problem(s), Ear problem(s)  Cardiovascular Cardiovascular Symptoms Reported: No symptoms  reported Cardiovascular Self-Management Outcome: 4 (good)  Respiratory Respiratory Symptoms Reported: No symptoms reported Respiratory Self-Management Outcome: 5 (very good)  Endocrine Patient reports the following symptoms related to hypoglycemia or hyperglycemia : Headaches Is patient diabetic?: Yes Endocrine Conditions: Diabetes Endocrine Management Strategies: Medication therapy, Routine screening, Diet modification, Fluid modification, Adequate rest Endocrine Self-Management Outcome: 4 (good)  Gastrointestinal Gastrointestinal Symptoms Reported: No symptoms reported Gastrointestinal Management Strategies: Diet modification Gastrointestinal Self-Management Outcome: 4 (good) Nutrition Risk Screen (CP): No indicators present  Genitourinary Genitourinary Symptoms Reported: Pain with urination Other Genitourinary Symptoms: pain not sure if related to kidneys Genitourinary Conditions: Chronic kidney disease Genitourinary Self-Management Outcome: 3 (uncertain)  Integumentary Integumentary Symptoms Reported: No symptoms reported Skin Management Strategies: Routine screening Skin Self-Management Outcome: 4 (good)  Musculoskeletal Musculoskelatal Symptoms Reviewed: Muscle pain, Difficulty walking Additional Musculoskeletal Details: back pain going to both hips Musculoskeletal Conditions: Back pain, Joint pain, Osteoarthritis, Rheumatoid arthritis Musculoskeletal Management Strategies: Adequate rest, Medical device, Medication therapy, Routine screening Musculoskeletal Self-Management Outcome: 3 (uncertain)      Psychosocial Psychosocial Symptoms Reported: No symptoms reported Behavioral Health Self-Management Outcome: 4 (good)          12/08/2023    2:32 PM  Depression screen PHQ 2/9  Decreased Interest 0  Down, Depressed, Hopeless 0  PHQ - 2 Score 0    There were no vitals filed for this visit.  Medications Reviewed Today   Medications were not reviewed in this encounter      Recommendation:   PCP Follow-up  Follow Up Plan:   Telephone follow up appointment date/time:  11/17/23  Jullie Oiler L. Mcarthur Speedy, RN, BSN, CCM [facility] RN Care Manager Direct Dial: [phone]  Fax: [phone]

## 2023-11-15 ENCOUNTER — Encounter

## 2023-11-17 ENCOUNTER — Other Ambulatory Visit: Payer: Self-pay | Admitting: *Deleted

## 2023-11-17 ENCOUNTER — Ambulatory Visit
Admission: RE | Admit: 2023-11-17 | Discharge: 2023-11-17 | Disposition: A | Discharge status: Home / Self Care | Source: Ambulatory Visit | Attending: Internal Medicine | Admitting: Internal Medicine

## 2023-11-17 ENCOUNTER — Ambulatory Visit: Payer: Self-pay | Admitting: Internal Medicine

## 2023-11-17 ENCOUNTER — Ambulatory Visit (INDEPENDENT_AMBULATORY_CARE_PROVIDER_SITE_OTHER): Admitting: Internal Medicine

## 2023-11-17 ENCOUNTER — Encounter: Payer: Self-pay | Admitting: Internal Medicine

## 2023-11-17 ENCOUNTER — Other Ambulatory Visit: Payer: Self-pay

## 2023-11-17 VITALS — BP 144/92 | HR 81 | Ht 63.0 in | Wt 177.6 lb

## 2023-11-17 DIAGNOSIS — E1165 Type 2 diabetes mellitus with hyperglycemia: Secondary | ICD-10-CM

## 2023-11-17 DIAGNOSIS — I671 Cerebral aneurysm, nonruptured: Secondary | ICD-10-CM

## 2023-11-17 DIAGNOSIS — E782 Mixed hyperlipidemia: Secondary | ICD-10-CM

## 2023-11-17 DIAGNOSIS — E1159 Type 2 diabetes mellitus with other circulatory complications: Secondary | ICD-10-CM

## 2023-11-17 DIAGNOSIS — N184 Chronic kidney disease, stage 4 (severe): Secondary | ICD-10-CM | POA: Diagnosis not present

## 2023-11-17 DIAGNOSIS — G44039 Episodic paroxysmal hemicrania, not intractable: Secondary | ICD-10-CM

## 2023-11-17 DIAGNOSIS — M0579 Rheumatoid arthritis with rheumatoid factor of multiple sites without organ or systems involvement: Secondary | ICD-10-CM | POA: Diagnosis not present

## 2023-11-17 DIAGNOSIS — E1169 Type 2 diabetes mellitus with other specified complication: Secondary | ICD-10-CM | POA: Diagnosis not present

## 2023-11-17 DIAGNOSIS — I152 Hypertension secondary to endocrine disorders: Secondary | ICD-10-CM

## 2023-11-17 DIAGNOSIS — R519 Headache, unspecified: Secondary | ICD-10-CM | POA: Diagnosis not present

## 2023-11-17 LAB — POCT CBG (FASTING - GLUCOSE)-MANUAL ENTRY: Glucose Fasting, POC: 116 mg/dL — AB (ref 70–99)

## 2023-11-17 NOTE — Progress Notes (Signed)
 Patient notified

## 2023-11-17 NOTE — Progress Notes (Signed)
 Established Patient Office Visit  Subjective:  Patient ID: [name], female    DOB: 07-Jun-1946  Age: 78 y.o. MRN: [id number]  Chief Complaint  Patient presents with   Headache    Patient comes in with 3-day history of severe throbbing headache on the right side of her head.  It is not associated with photophobia or phonophobia.  She does not have a history of migraines.  She is not complaining of blurry vision, no nausea or vomiting.  No no motor or sensory deficits in the body.  Today she took Tylenol  arthritis and it is easing up a little bit.  Patient is concerned since she has a history of 3 cerebral aneurysms with coils.  Will order a stat CT of the head without contrast.     No other concerns at this time.   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 b.)07/18/2006 -endovas oblit complex ACOM aneur. c.) Endovas Tx of 6.8x44mm RMCA aneur. d.) 3.5x7mm remnant RMCA aneur 2/2 coil compaction. e.) Interval 3.2x3.31mm saccular outpouching in ACOM c/w mild recannulization in neck. 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 g.) 3.7x87mm remnant of prev Tx'd ACOM aneur -failed embol 02/24/2012.   CKD (chronic kidney disease), stage IV (HCC)    a.) solitary functioning kidney on the RIGHT   Complication of anesthesia    a.) delayed emergence   Coronary artery disease 02/23/2007   a.) LHC 02/23/2007 --> EF 50%; 30% pLAD, 70% mRCA --> planned for staged PCI. b.) PCI 02/27/2007: EF 60%; 3.5 x 15 mm Vision BMS to 80% mRCA. b.) LHC 07/24/2007: EF 60%; minor irregs; no occlusive CAD; no intervention. c.) LHC 10/28/2010: EF 60%; 30% mLAD, LCx with minor luminal irregs, 20% ISR mRCA; no intervention. d.) LHC 05/31/2013: EF 60%; 40% mLAD, 30% ISR m-dRCA; no interventions.   DDD (degenerative disc disease), cervical    a.) s/p ACDF C5-C7; hardware in neck;  patient appreciates stiffness and issues with mobility   Diastolic dysfunction    a.)  TTE 07/07/2021: EF 87.8%; normal PASP; trace TR/MR; G1DD.   Elevated LEFT hemidiaphragm    Gout    Headache(784.0)    Heart murmur    HLD (hyperlipidemia)    HOH (hard of hearing)    Has hearing aids, doesn't wear   Hyperkalemia    Hyperparathyroidism due to renal insufficiency (HCC)    Hypertension    Incomplete right bundle branch block (RBBB)    Insomnia    a.) takes zolpidem    Long term (current) use of immunomodulator    a.) on DMARD therapy (hydroxychloroquine ) for RA/SLE Dx.   Lumbar adjacent segment disease with spondylolisthesis    Multiple acquired cysts of kidney    On chronic clopidogrel  therapy    OSA (obstructive sleep apnea)    a.) does NOT use nocturnal pap therapy   Osteoarthritis    Pancreatic cyst    Peripheral vascular disease (HCC)    Pneumonia    Post-COVID chronic cough    Pulmonary nodule    RBBB (right bundle branch block)    Rectal bleeding    Rheumatoid arthritis (HCC)    a.) on DMARD; hydroxychloriquine   Shortness of breath 03/01/2023   Systemic lupus erythematosus (HCC)    T2DM (type 2 diabetes mellitus) (HCC)    Tendinitis of left wrist  Thickened endometrium    Wears dentures    partial lower    Past Surgical History:  Procedure Laterality Date   ANEURYSM COILING  08/2013   ANTERIOR LATERAL LUMBAR FUSION WITH PERCUTANEOUS SCREW 1 LEVEL N/A 03/09/2023   Procedure: L3-4 LATERAL LUMBAR INTERBODY FUSION;  Surgeon: Jodeen Munch, MD;  Location: [facility];  Service: Neurosurgery;  Laterality: N/A;   APPLICATION OF INTRAOPERATIVE CT SCAN N/A 03/09/2023   Procedure: APPLICATION OF INTRAOPERATIVE CT SCAN;  Surgeon: Jodeen Munch, MD;  Location: [facility];  Service: Neurosurgery;  Laterality: N/A;   Attempted embolization of previously treated ACOM aneurysm; unsuccessful N/A 01/28/2012   Location: [facility]   BACK SURGERY     BARTHOLIN CYST  MARSUPIALIZATION     BICEPT TENODESIS Right 03/02/2022   Procedure: BICEPS TENODESIS;  Surgeon: Elner Hahn, MD;  Location: [facility];  Service: Orthopedics;  Laterality: Right;   BREAST CYST ASPIRATION Right 01/25/2012   FNA neg.   BREAST EXCISIONAL BIOPSY Left 06/26/2007   neg   BUNIONECTOMY Bilateral 2022   CARDIAC CATHETERIZATION Left 02/23/2007   Procedure: CARDIAC CATHETERIZATION; Location: [facility]; Surgeon: Debborah Fairly, MD   CARDIAC CATHETERIZATION Left 07/31/2007   Procedure: CARDIAC CATHETERIZATION; Location: [facility]; Surgeon: Debborah Fairly, MD   CARDIAC CATHETERIZATION Left 10/28/2010   Procedure: CARDIAC CATHETERIZATION; Location: [facility]; Surgeon: Debborah Fairly, MD   CARDIAC CATHETERIZATION Left 05/31/2013   Procedure: CARDIAC CATHETERIZATION; Location: [facility]; Surgeon: Debborah Fairly, MD   Carotid arteriogram; 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 N/A 07/18/2006   [facility]   CATARACT EXTRACTION     CERVICAL FUSION N/A    Procedure: ACDF C5-C7   COLONOSCOPY WITH PROPOFOL  N/A 10/04/2019   Procedure: COLONOSCOPY WITH BIOPSY;  Surgeon: Selena Daily, MD;  Location: [facility];  Service: Endoscopy;  Laterality: N/A;  Diabetic (borderline) - oral meds priority 3   CORONARY STENT INTERVENTION Left 02/27/2007   Procedure: CORONARY STENT INTERVENTION (3.5 x 15 mm Vision BMS to mRCA); Location: [facility]; Surgeon: Antionette Kirks, MD   Endovascular near complete obliteration of enlarging neck remnant of previously treated RIGHT MCA aneurysm using stent assisted coiling N/A 09/03/2010   Location: [facility]   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 N/A 10/03/2006   Location: [facility]   HYSTEROSCOPY WITH D & C N/A 08/04/2021   Procedure: DILATATION AND CURETTAGE /HYSTEROSCOPY;  Surgeon: Heron Lord, MD;  Location: [facility];  Service: Gynecology;  Laterality: N/A;    POLYPECTOMY N/A 10/04/2019   Procedure: POLYPECTOMY;  Surgeon: Selena Daily, MD;  Location: [facility];  Service: Endoscopy;  Laterality: N/A;   REVERSE SHOULDER ARTHROPLASTY Right 03/02/2022   Procedure: REVERSE SHOULDER ARTHROPLASTY;  Surgeon: Elner Hahn, MD;  Location: [facility];  Service: Orthopedics;  Laterality: Right;   UPPER ESOPHAGEAL ENDOSCOPIC ULTRASOUND (EUS) N/A 04/15/2016   Procedure: UPPER ESOPHAGEAL ENDOSCOPIC ULTRASOUND (EUS);  Surgeon: Eloisa Hait, MD;  Location: [facility];  Service: Gastroenterology;  Laterality: N/A;    Social History   Socioeconomic History   Marital status: Widowed    Spouse name: Not on file   Number of children: Not on file   Years of education: Not on file   Highest education level: Not on file  Occupational History   Not on file  Tobacco Use   Smoking status: Never    Passive exposure: Never   Smokeless tobacco: Never  Vaping Use   Vaping status: Never  Used  Substance and Sexual Activity   Alcohol use: Yes    Comment: Occasionally at christmas   Drug use: No   Sexual activity: Not Currently    Birth control/protection: Post-menopausal  Other Topics Concern   Not on file  Social History Narrative   Lives with daughter   Social Drivers of Health   Financial Resource Strain: Low Risk  (10/11/2023)   Overall Financial Resource Strain (CARDIA)    Difficulty of Paying Living Expenses: Not hard at all  Food Insecurity: No Food Insecurity (10/11/2023)   Hunger Vital Sign    Worried About Running Out of Food in the Last Year: Never true    Ran Out of Food in the Last Year: Never true  Transportation Needs: No Transportation Needs (10/11/2023)   PRAPARE - Administrator, Civil Service (Medical): No    Lack of Transportation (Non-Medical): No  Physical Activity: Not on file  Stress: No Stress Concern Present (10/11/2023)   [employer] of Occupational Health - Occupational Stress Questionnaire     Feeling of Stress : Not at all  Social Connections: Moderately Isolated (10/11/2023)   Social Connection and Isolation Panel [NHANES]    Frequency of Communication with Friends and Family: More than three times a week    Frequency of Social Gatherings with Friends and Family: More than three times a week    Attends Religious Services: Never    Database administrator or Organizations: Yes    Attends Banker Meetings: Never    Marital Status: Widowed  Intimate Partner Violence: Not At Risk (10/11/2023)   Humiliation, Afraid, Rape, and Kick questionnaire    Fear of Current or Ex-Partner: No    Emotionally Abused: No    Physically Abused: No    Sexually Abused: No    Family History  Problem Relation Age of Onset   Breast cancer Paternal Aunt 23   Breast cancer Paternal Aunt    Heart disease Mother    Heart disease Father    Stroke Father    Diabetes Sister    Diabetes Brother     Allergies  Allergen Reactions   Isosorbide Dinitrate Other (See Comments)    Collapse   Amlodipine Swelling   Sulfa Antibiotics Itching   Tizanidine Hcl    Oxycodone  Itching   Sulfasalazine Itching    Outpatient Medications Prior to Visit  Medication Sig   allopurinol  (ZYLOPRIM ) 100 MG tablet TAKE 1 TABLET BY MOUTH EVERY DAY   clotrimazole -betamethasone (LOTRISONE) cream APPLY TWICE A DAY TO GROIN AREA SKIN FOR 1 WEEK AND THEN AS NEEDED.   ezetimibe  (ZETIA ) 10 MG tablet Take 1 tablet (10 mg total) by mouth daily. (Patient not taking: Reported on 11/17/2023)   fluticasone  (FLONASE ) 50 MCG/ACT nasal spray Place 2 sprays into both nostrils daily. SPRAY 2 SPRAYS INTO EACH NOSTRIL EVERY DAY   furosemide  (LASIX ) 40 MG tablet TAKE 1 TABLET BY MOUTH EVERY DAY   glimepiride  (AMARYL ) 2 MG tablet TAKE 1 TABLET (2 MG TOTAL) BY MOUTH SEE ADMIN INSTRUCTIONS. TAKE 1 MG DAILY, MAY INCREASE TO 2 MG IF BLOOD SUGAR IS OVER 175   hydrALAZINE  (APRESOLINE ) 100 MG tablet TAKE 1 TABLET BY MOUTH TWICE A DAY  (Patient taking differently: Take 100 mg by mouth 2 (two) times daily. Takes/1/2 tablet BID)   hydroxychloroquine  (PLAQUENIL ) 200 MG tablet Take 200 mg by mouth daily.   labetalol  (NORMODYNE ) 300 MG tablet TAKE 1 TABLET BY MOUTH TWICE A DAY  losartan  (COZAAR ) 100 MG tablet Take 1 tablet (100 mg total) by mouth every morning.   methocarbamol  (ROBAXIN ) 500 MG tablet TAKE 1 TABLET BY MOUTH EVERY 6 HOURS AS NEEDED FOR MUSCLE SPASMS.   nitroGLYCERIN  (NITROSTAT ) 0.6 MG SL tablet TAKE 1 TABLET BY MOUTH AS NEEDED FOR CP. WAIT 5 MINUTES BEFORE NEXT DOSE. PROCEED TO ER IF NO RELIEF AFTER 3 DOSES   pantoprazole (PROTONIX) 40 MG tablet TAKE 1 TABLET BY MOUTH EVERY DAY   rosuvastatin  (CRESTOR ) 40 MG tablet TAKE 1 TABLET BY MOUTH EVERY DAY   senna (SENOKOT) 8.6 MG TABS tablet Take 1 tablet (8.6 mg total) by mouth daily as needed for mild constipation. (Patient not taking: Reported on 11/17/2023)   SYMBICORT 80-4.5 MCG/ACT inhaler INHALE 1 PUFF BY MOUTH TWICE A DAY   traMADol  (ULTRAM ) 50 MG tablet Take 1 tablet (50 mg total) by mouth every 6 (six) hours as needed for moderate pain (pain score 4-6).   tretinoin (RETIN-A) 0.05 % cream APPLY TO AFFECTED AREA EVERY DAY AT BEDTIME   zolpidem  (AMBIEN ) 5 MG tablet Take 1 tablet (5 mg total) by mouth at bedtime as needed for sleep.   gabapentin  (NEURONTIN ) 100 MG capsule Take 1-3 capsules (100-300 mg total) by mouth at bedtime.   No facility-administered medications prior to visit.    Review of Systems  Constitutional:  Positive for malaise/fatigue. Negative for diaphoresis and fever.  HENT: Negative.  Negative for congestion, sinus pain and sore throat.   Eyes: Negative.  Negative for blurred vision, double vision, photophobia and pain.  Respiratory: Negative.  Negative for cough and shortness of breath.   Cardiovascular: Negative.  Negative for chest pain, palpitations and leg swelling.  Gastrointestinal: Negative.  Negative for abdominal pain, constipation,  diarrhea, heartburn, nausea and vomiting.  Genitourinary: Negative.  Negative for dysuria and flank pain.  Musculoskeletal: Negative.  Negative for joint pain and myalgias.  Skin: Negative.   Neurological:  Positive for headaches. Negative for dizziness, tingling, tremors and sensory change.  Endo/Heme/Allergies: Negative.   Psychiatric/Behavioral: Negative.  Negative for depression and suicidal ideas. The patient is not nervous/anxious.        Objective:   BP (!) 144/92   Pulse 81   Ht 5\' 3"  (1.6 m)   Wt 177 lb 9.6 oz (80.6 kg)   SpO2 97%   BMI 31.46 kg/m   Vitals:   11/17/23 1119  BP: (!) 144/92  Pulse: 81  Height: 5\' 3"  (1.6 m)  Weight: 177 lb 9.6 oz (80.6 kg)  SpO2: 97%  BMI (Calculated): 31.47    Physical Exam Vitals and nursing note reviewed.  Constitutional:      Appearance: Normal appearance.  HENT:     Head: Normocephalic and atraumatic.     Nose: Nose normal.     Mouth/Throat:     Mouth: Mucous membranes are moist.     Pharynx: Oropharynx is clear.  Eyes:     Conjunctiva/sclera: Conjunctivae normal.     Pupils: Pupils are equal, round, and reactive to light.  Cardiovascular:     Rate and Rhythm: Normal rate and regular rhythm.     Pulses: Normal pulses.     Heart sounds: Normal heart sounds. No murmur heard. Pulmonary:     Effort: Pulmonary effort is normal.     Breath sounds: Normal breath sounds. No wheezing.  Abdominal:     General: Bowel sounds are normal.     Palpations: Abdomen is soft.     Tenderness:  There is no abdominal tenderness. There is no right CVA tenderness or left CVA tenderness.  Musculoskeletal:        General: Normal range of motion.     Cervical back: Normal range of motion.     Right lower leg: No edema.     Left lower leg: No edema.  Skin:    General: Skin is warm and dry.  Neurological:     General: No focal deficit present.     Mental Status: She is alert and oriented to person, place, and time.  Psychiatric:         Mood and Affect: Mood normal.        Behavior: Behavior normal.      Results for orders placed or performed in visit on 11/17/23  POCT CBG (Fasting - Glucose)  Result Value Ref Range   Glucose Fasting, POC 116 (A) 70 - 99 mg/dL    Recent Results (from the past 2160 hours)  CMP14+EGFR     Status: Abnormal   Collection Time: 10/11/23  1:56 PM  Result Value Ref Range   Glucose 101 (H) 70 - 99 mg/dL   BUN 57 (H) 8 - 27 mg/dL   Creatinine, Ser 3.29 (H) 0.57 - 1.00 mg/dL   eGFR 15 (L) >51 OA/CZY/6.06   BUN/Creatinine Ratio 19 12 - 28   Sodium 148 (H) 134 - 144 mmol/L   Potassium 4.2 3.5 - 5.2 mmol/L   Chloride 112 (H) 96 - 106 mmol/L   CO2 21 20 - 29 mmol/L   Calcium  9.2 8.7 - 10.3 mg/dL   Total Protein 5.9 (L) 6.0 - 8.5 g/dL   Albumin  3.9 3.8 - 4.8 g/dL   Globulin, Total 2.0 1.5 - 4.5 g/dL   Bilirubin Total 0.4 0.0 - 1.2 mg/dL   Alkaline Phosphatase 80 44 - 121 IU/L   AST 15 0 - 40 IU/L   ALT 11 0 - 32 IU/L  CBC with Diff     Status: Abnormal   Collection Time: 10/11/23  1:56 PM  Result Value Ref Range   WBC 5.9 3.4 - 10.8 x10E3/uL   RBC 3.47 (L) 3.77 - 5.28 x10E6/uL   Hemoglobin 10.3 (L) 11.1 - 15.9 g/dL   Hematocrit 30.1 (L) 60.1 - 46.6 %   MCV 92 79 - 97 fL   MCH 29.7 26.6 - 33.0 pg   MCHC 32.2 31.5 - 35.7 g/dL   RDW 09.3 23.5 - 57.3 %   Platelets 188 150 - 450 x10E3/uL   Neutrophils 59 Not Estab. %   Lymphs 31 Not Estab. %   Monocytes 5 Not Estab. %   Eos 4 Not Estab. %   Basos 1 Not Estab. %   Neutrophils Absolute 3.5 1.4 - 7.0 x10E3/uL   Lymphocytes Absolute 1.8 0.7 - 3.1 x10E3/uL   Monocytes Absolute 0.3 0.1 - 0.9 x10E3/uL   EOS (ABSOLUTE) 0.2 0.0 - 0.4 x10E3/uL   Basophils Absolute 0.0 0.0 - 0.2 x10E3/uL   Immature Granulocytes 0 Not Estab. %   Immature Grans (Abs) 0.0 0.0 - 0.1 x10E3/uL  Lipid Panel w/o Chol/HDL Ratio     Status: Abnormal   Collection Time: 10/11/23  1:56 PM  Result Value Ref Range   Cholesterol, Total 279 (H) 100 - 199 mg/dL    Triglycerides 220 (H) 0 - 149 mg/dL   HDL 38 (L) >25 mg/dL   VLDL Cholesterol Cal 57 (H) 5 - 40 mg/dL   LDL Chol Calc (NIH) 427 (  H) 0 - 99 mg/dL  Hemoglobin Z6X     Status: Abnormal   Collection Time: 10/11/23  1:56 PM  Result Value Ref Range   Hgb A1c MFr Bld 6.1 (H) 4.8 - 5.6 %    Comment:          Prediabetes: 5.7 - 6.4          Diabetes: >6.4          Glycemic control for adults with diabetes: <7.0    Est. average glucose Bld gHb Est-mCnc 128 mg/dL  POCT CBG (Fasting - Glucose)     Status: Abnormal   Collection Time: 10/18/23  1:07 PM  Result Value Ref Range   Glucose Fasting, POC 147 (A) 70 - 99 mg/dL  POCT CBG (Fasting - Glucose)     Status: Abnormal   Collection Time: 11/17/23 11:23 AM  Result Value Ref Range   Glucose Fasting, POC 116 (A) 70 - 99 mg/dL      Assessment & Plan:  Episodic right-sided headaches.  Stat CT of the head is unremarkable.  Since it responded to Tylenol , patient advised to continue taking it as needed.  If it gets worse to let us  know or go to the emergency room.  Will need an MRI at that time. Continue rest of the medications. Problem List Items Addressed This Visit     Rheumatoid arthritis involving multiple sites with positive rheumatoid factor (HCC)   Type 2 diabetes mellitus with hyperglycemia, without long-term current use of insulin  (HCC)   Relevant Orders   POCT CBG (Fasting - Glucose) (Completed)   Hypertension associated with diabetes (HCC)   Combined hyperlipidemia associated with type 2 diabetes mellitus (HCC)   Chronic kidney disease, stage IV (severe) (HCC)   Other Visit Diagnoses       Episodic paroxysmal hemicrania, not intractable    -  Primary     Brain aneurysm       Relevant Orders   CT HEAD WO CONTRAST ( ) (Completed)       Return in about 1 week (around 11/24/2023).   Total time spent: 30 minutes  Aisha Hove, MD  11/17/2023   This document may have been prepared by [facility] Voice Recognition software and as  such may include unintentional dictation errors.

## 2023-11-17 NOTE — Patient Outreach (Signed)
 Complex Care Management   Visit Note  12/08/2023 updated noted for 11/17/23  Name:  [name] MRN: [id number] DOB: 1946/01/24  Situation: Referral received for Complex Care Management related to Chronic Kidney Disease I obtained verbal consent from Patient.  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 b.)07/18/2006 -endovas oblit complex ACOM aneur. c.) Endovas Tx of 6.8x29mm RMCA aneur. d.) 3.5x13mm remnant RMCA aneur 2/2 coil compaction. e.) Interval 3.2x3.51mm saccular outpouching in ACOM c/w mild recannulization in neck. 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 g.) 3.7x13mm remnant of prev Tx'd ACOM aneur -failed embol 02/24/2012.   CKD (chronic kidney disease), stage IV (HCC)    a.) solitary functioning kidney on the RIGHT   Complication of anesthesia    a.) delayed emergence   Coronary artery disease 02/23/2007   a.) LHC 02/23/2007 --> EF 50%; 30% pLAD, 70% mRCA --> planned for staged PCI. b.) PCI 02/27/2007: EF 60%; 3.5 x 15 mm Vision BMS to 80% mRCA. b.) LHC 07/24/2007: EF 60%; minor irregs; no occlusive CAD; no intervention. c.) LHC 10/28/2010: EF 60%; 30% mLAD, LCx with minor luminal irregs, 20% ISR mRCA; no intervention. d.) LHC 05/31/2013: EF 60%; 40% mLAD, 30% ISR m-dRCA; no interventions.   DDD (degenerative disc disease), cervical    a.) s/p ACDF C5-C7; hardware in neck; patient appreciates stiffness and issues with mobility   Diastolic dysfunction    a.)  TTE 07/07/2021: EF 87.8%; normal PASP; trace TR/MR; G1DD.   Elevated LEFT hemidiaphragm    Gout    Headache(784.0)    Heart murmur    HLD (hyperlipidemia)    HOH (hard of hearing)    Has hearing aids, doesn't wear   Hyperkalemia    Hyperparathyroidism due to renal insufficiency (HCC)    Hypertension     Incomplete right bundle branch block (RBBB)    Insomnia    a.) takes zolpidem    Long term (current) use of immunomodulator    a.) on DMARD therapy (hydroxychloroquine ) for RA/SLE Dx.   Lumbar adjacent segment disease with spondylolisthesis    Multiple acquired cysts of kidney    On chronic clopidogrel  therapy    OSA (obstructive sleep apnea)    a.) does NOT use nocturnal pap therapy   Osteoarthritis    Pancreatic cyst    Peripheral vascular disease (HCC)    Pneumonia    Post-COVID chronic cough    Pulmonary nodule    RBBB (right bundle branch block)    Rectal bleeding    Rheumatoid arthritis (HCC)    a.) on DMARD; hydroxychloriquine   Shortness of breath 03/01/2023   Systemic lupus erythematosus (HCC)    T2DM (type 2 diabetes mellitus) (HCC)    Tendinitis of left wrist    Thickened endometrium    Thickened endometrium    Wears dentures    partial lower    Assessment: Patient Reported Symptoms:  Cognitive Cognitive Status: Alert and oriented to person, place, and time, Normal speech and language skills, Insightful and able to interpret abstract concepts      Neurological Neurological Review of Symptoms: Headaches Neurological Management Strategies: Routine screening Neurological Self-Management Outcome: 4 (good)  HEENT HEENT Symptoms Reported: No symptoms reported HEENT Self-Management Outcome: 4 (good)    Cardiovascular Cardiovascular  Symptoms Reported: No symptoms reported Cardiovascular Self-Management Outcome: 4 (good)  Respiratory Respiratory Symptoms Reported: No symptoms reported Respiratory Self-Management Outcome: 4 (good)  Endocrine Patient reports the following symptoms related to hypoglycemia or hyperglycemia : Headaches Is patient diabetic?: Yes Is patient checking blood sugars at home?: No Endocrine Conditions: Diabetes Endocrine Management Strategies: Medication therapy, Adequate rest, Diet modification, Fluid modification, Medical device, Routine  screening, Weight management Endocrine Self-Management Outcome: 4 (good) Endocrine Comment: hx of headaches  Gastrointestinal Gastrointestinal Symptoms Reported: No symptoms reported Gastrointestinal Self-Management Outcome: 4 (good) Nutrition Risk Screen (CP): No indicators present  Genitourinary Genitourinary Symptoms Reported: Other Other Genitourinary Symptoms: Back pain Lower/radiates to both hips    Integumentary Integumentary Symptoms Reported: No symptoms reported Skin Self-Management Outcome: 4 (good)  Musculoskeletal Musculoskelatal Symptoms Reviewed: Muscle pain Additional Musculoskeletal Details: Back pain Lower/radiates to both hips        Psychosocial Psychosocial Symptoms Reported: No symptoms reported Behavioral Health Self-Management Outcome: 4 (good)          12/08/2023    2:32 PM  Depression screen PHQ 2/9  Decreased Interest 0  Down, Depressed, Hopeless 0  PHQ - 2 Score 0    There were no vitals filed for this visit.  Medications Reviewed Today     Reviewed by Arlyce Berger, RN (Registered Nurse) on 11/17/23 at 1651  Med List Status: <None>   Medication Order Taking? Sig Documenting Provider Last Dose Status Informant  allopurinol  (ZYLOPRIM ) 100 MG tablet [id number] Yes TAKE 1 TABLET BY MOUTH EVERY DAY Aisha Hove, MD Taking Active Self  clotrimazole -betamethasone (LOTRISONE) cream [id number] Yes APPLY TWICE A DAY TO GROIN AREA SKIN FOR 1 WEEK AND THEN AS NEEDED. Aisha Hove, MD Taking Active   ezetimibe  (ZETIA ) 10 MG tablet [id number] No Take 1 tablet (10 mg total) by mouth daily.  Patient not taking: Reported on 11/17/2023   Aisha Hove, MD Not Taking Active   fluticasone  (FLONASE ) 50 MCG/ACT nasal spray [id number] Yes Place 2 sprays into both nostrils daily. SPRAY 2 SPRAYS INTO EACH NOSTRIL EVERY DAY Aisha Hove, MD Taking Active   furosemide  (LASIX ) 40 MG tablet [id number] Yes TAKE 1 TABLET BY MOUTH EVERY DAY Aisha Hove, MD Taking Active    gabapentin  (NEURONTIN ) 100 MG capsule [id number]  Take 1-3 capsules (100-300 mg total) by mouth at bedtime. Cephus Collin, MD  Expired 11/10/23 2359   glimepiride  (AMARYL ) 2 MG tablet [id number] Yes TAKE 1 TABLET (2 MG TOTAL) BY MOUTH SEE ADMIN INSTRUCTIONS. TAKE 1 MG DAILY, MAY INCREASE TO 2 MG IF BLOOD SUGAR IS OVER 175 Aisha Hove, MD Taking Active   hydrALAZINE  (APRESOLINE ) 100 MG tablet [id number] Yes TAKE 1 TABLET BY MOUTH TWICE A DAY  Patient taking differently: Take 100 mg by mouth 2 (two) times daily. Takes/1/2 tablet BID   Cherrie Cornwall, MD Taking Active Self  hydroxychloroquine  (PLAQUENIL ) 200 MG tablet [id number] Yes Take 200 mg by mouth daily. [provider] Taking Active Self           Med Note Vertie Gosling, Lester Rather   Fri Aug 20, 2016  7:20 AM)    labetalol  (NORMODYNE ) 300 MG tablet [id number] Yes TAKE 1 TABLET BY MOUTH TWICE A DAY Aisha Hove, MD Taking Active   losartan  (COZAAR ) 100 MG tablet [id number] Yes Take 1 tablet (100 mg total) by mouth every morning. Aisha Hove, MD Taking Active   methocarbamol  (ROBAXIN ) 500 MG tablet [id number] Yes TAKE 1 TABLET  BY MOUTH EVERY 6 HOURS AS NEEDED FOR MUSCLE SPASMS. Ludwig Safer, PA-C Taking Active   nitroGLYCERIN  (NITROSTAT ) 0.6 MG SL tablet [id number] Yes TAKE 1 TABLET BY MOUTH AS NEEDED FOR CP. WAIT 5 MINUTES BEFORE NEXT DOSE. PROCEED TO ER IF NO RELIEF AFTER 3 DOSES Aisha Hove, MD Taking Active   pantoprazole (PROTONIX) 40 MG tablet [id number] Yes TAKE 1 TABLET BY MOUTH EVERY DAY Aisha Hove, MD Taking Active   rosuvastatin  (CRESTOR ) 40 MG tablet [id number] Yes TAKE 1 TABLET BY MOUTH EVERY DAY Scoggins, Amber, NP Taking Active   senna (SENOKOT) 8.6 MG TABS tablet [id number] No Take 1 tablet (8.6 mg total) by mouth daily as needed for mild constipation.  Patient not taking: Reported on 11/17/2023   Noble Bateman, [place] Not Taking Active   SYMBICORT 80-4.5 MCG/ACT inhaler [id number] Yes INHALE 1 PUFF BY MOUTH TWICE A DAY  Aisha Hove, MD Taking Active   traMADol  (ULTRAM ) 50 MG tablet [id number] Yes Take 1 tablet (50 mg total) by mouth every 6 (six) hours as needed for moderate pain (pain score 4-6). Jodeen Munch, MD Taking Active   tretinoin (RETIN-A) 0.05 % cream [id number] Yes APPLY TO AFFECTED AREA EVERY DAY AT BEDTIME Scoggins, Amber, NP Taking Active   zolpidem  (AMBIEN ) 5 MG tablet [id number] Yes Take 1 tablet (5 mg total) by mouth at bedtime as needed for sleep. Trenda Frisk, FNP Taking Active   Med List Note Merilyn Staple, RN 09/26/23 1448): 08-04-23 UDS MR 10/26/23            Recommendation:   11/28/23 PCP Follow-up Specialty provider follow-up general surgeon for gall bladder removal 12/08/23 dr Ofilia Benton Pending call from Nephrology, Zelda Hickman Continue Current Plan of Care  Follow Up Plan:   Telephone follow up appointment date/time:  12/08/23 3 pm  Teofilo Lupinacci L. Mcarthur Speedy, RN, BSN, CCM [place]  [facility], [facility] RN Care Manager Direct Dial: [phone]  Fax: [phone]

## 2023-11-17 NOTE — Patient Instructions (Addendum)
 Visit Information  Thank you for taking time to visit with me today. Please don't hesitate to contact me if I can be of assistance to you before our next scheduled appointment.  Your next care management appointment is by telephone on 12/08/23 at 3 pm  Please reach out to medical services if your headache persists. Review education information provided on headaches, migraines, analgesic rebound headache  Please call the care guide team at [phone] if you need to cancel, schedule, or reschedule an appointment.   Please call the Suicide and Crisis Lifeline: 988 call the [place]  National Suicide Prevention Lifeline: 863-716-7727 or TTY: 631-148-4421 TTY 224 693 4379) to talk to a trained counselor call 1-800-273-TALK (toll free, 24 hour hotline) go to [facility] [address], [place] [phone]) call 911 if you are experiencing a Mental Health or Behavioral Health Crisis or need someone to talk to.  Branden Vine L. Mcarthur Speedy, RN, BSN, CCM [place]  [facility], [facility] RN Care Manager Direct Dial: [phone]  Fax: [phone]

## 2023-11-20 ENCOUNTER — Other Ambulatory Visit: Payer: Self-pay | Admitting: Cardiology

## 2023-11-20 ENCOUNTER — Other Ambulatory Visit: Payer: Self-pay | Admitting: Internal Medicine

## 2023-11-20 DIAGNOSIS — E782 Mixed hyperlipidemia: Secondary | ICD-10-CM

## 2023-11-20 DIAGNOSIS — I10 Essential (primary) hypertension: Secondary | ICD-10-CM

## 2023-11-22 ENCOUNTER — Ambulatory Visit: Admitting: Surgery

## 2023-11-23 ENCOUNTER — Other Ambulatory Visit: Payer: Self-pay

## 2023-11-23 DIAGNOSIS — N189 Chronic kidney disease, unspecified: Secondary | ICD-10-CM | POA: Diagnosis not present

## 2023-11-23 DIAGNOSIS — I10 Essential (primary) hypertension: Secondary | ICD-10-CM | POA: Diagnosis not present

## 2023-11-23 DIAGNOSIS — N184 Chronic kidney disease, stage 4 (severe): Secondary | ICD-10-CM | POA: Diagnosis not present

## 2023-11-23 DIAGNOSIS — F5101 Primary insomnia: Secondary | ICD-10-CM

## 2023-11-23 DIAGNOSIS — N2581 Secondary hyperparathyroidism of renal origin: Secondary | ICD-10-CM | POA: Diagnosis not present

## 2023-11-23 DIAGNOSIS — I129 Hypertensive chronic kidney disease with stage 1 through stage 4 chronic kidney disease, or unspecified chronic kidney disease: Secondary | ICD-10-CM | POA: Diagnosis not present

## 2023-11-24 ENCOUNTER — Encounter: Payer: Self-pay | Admitting: Internal Medicine

## 2023-11-24 ENCOUNTER — Ambulatory Visit: Admitting: Internal Medicine

## 2023-11-24 ENCOUNTER — Ambulatory Visit: Payer: Self-pay | Admitting: Internal Medicine

## 2023-11-24 VITALS — BP 144/86 | HR 65 | Ht 63.0 in | Wt 179.2 lb

## 2023-11-24 DIAGNOSIS — E1165 Type 2 diabetes mellitus with hyperglycemia: Secondary | ICD-10-CM

## 2023-11-24 DIAGNOSIS — E1169 Type 2 diabetes mellitus with other specified complication: Secondary | ICD-10-CM | POA: Diagnosis not present

## 2023-11-24 DIAGNOSIS — I152 Hypertension secondary to endocrine disorders: Secondary | ICD-10-CM

## 2023-11-24 DIAGNOSIS — F5101 Primary insomnia: Secondary | ICD-10-CM

## 2023-11-24 DIAGNOSIS — G44039 Episodic paroxysmal hemicrania, not intractable: Secondary | ICD-10-CM

## 2023-11-24 DIAGNOSIS — N184 Chronic kidney disease, stage 4 (severe): Secondary | ICD-10-CM

## 2023-11-24 DIAGNOSIS — M0579 Rheumatoid arthritis with rheumatoid factor of multiple sites without organ or systems involvement: Secondary | ICD-10-CM

## 2023-11-24 DIAGNOSIS — E1159 Type 2 diabetes mellitus with other circulatory complications: Secondary | ICD-10-CM | POA: Diagnosis not present

## 2023-11-24 DIAGNOSIS — E782 Mixed hyperlipidemia: Secondary | ICD-10-CM

## 2023-11-24 LAB — POCT CBG (FASTING - GLUCOSE)-MANUAL ENTRY: Glucose Fasting, POC: 231 mg/dL — AB (ref 70–99)

## 2023-11-24 MED ORDER — ZOLPIDEM TARTRATE 5 MG PO TABS: 5.0000 mg | ORAL_TABLET | Freq: Every evening | ORAL | 0 refills | Status: DC | PRN

## 2023-11-24 MED ORDER — HYDRALAZINE HCL 100 MG PO TABS: 100.0000 mg | ORAL_TABLET | Freq: Two times a day (BID) | ORAL | 1 refills | Status: DC

## 2023-11-24 NOTE — Progress Notes (Signed)
 Established Patient Office Visit  Subjective:  Patient ID: [name], female    DOB: [birth year]  Age: 78 y.o. MRN: [id number]  Chief Complaint  Patient presents with   Follow-up    1 week follow up    Patient comes in for follow-up of her headaches from last week.  Her CT scan without contrast was negative for bleed.  Today she is already feeling better and reports that Tylenol  helped her headache to go away.  Also suspect eyestrain because she tries not to use her reading glasses usually although she needs them.  Patient has several appointments coming up but advised to make an appointment with her eye doctor as well.  Denies nausea or vomiting, no chest pain or shortness of breath.    No other concerns at this time.   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 b.)07/18/2006 -endovas oblit complex ACOM aneur. c.) Endovas Tx of 6.8x50mm RMCA aneur. d.) 3.5x38mm remnant RMCA aneur 2/2 coil compaction. e.) Interval 3.2x3.70mm saccular outpouching in ACOM c/w mild recannulization in neck. 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 g.) 3.7x63mm remnant of prev Tx'd ACOM aneur -failed embol 02/24/2012.   CKD (chronic kidney disease), stage IV (HCC)    a.) solitary functioning kidney on the RIGHT   Complication of anesthesia    a.) delayed emergence   Coronary artery disease 02/23/2007   a.) LHC 02/23/2007 --> EF 50%; 30% pLAD, 70% mRCA --> planned for staged PCI. b.) PCI 02/27/2007: EF 60%; 3.5 x 15 mm Vision BMS to 80% mRCA. b.) LHC 07/24/2007: EF 60%; minor irregs; no occlusive CAD; no intervention. c.) LHC 10/28/2010: EF 60%; 30% mLAD, LCx with minor luminal irregs, 20% ISR mRCA; no intervention. d.) LHC 05/31/2013: EF 60%; 40% mLAD, 30% ISR m-dRCA; no interventions.   DDD (degenerative disc disease), cervical    a.) s/p ACDF C5-C7; hardware in  neck; patient appreciates stiffness and issues with mobility   Diastolic dysfunction    a.)  TTE 07/07/2021: EF 87.8%; normal PASP; trace TR/MR; G1DD.   Elevated LEFT hemidiaphragm    Gout    Headache(784.0)    Heart murmur    HLD (hyperlipidemia)    HOH (hard of hearing)    Has hearing aids, doesn't wear   Hyperkalemia    Hyperparathyroidism due to renal insufficiency (HCC)    Hypertension    Incomplete right bundle branch block (RBBB)    Insomnia    a.) takes zolpidem    Long term (current) use of immunomodulator    a.) on DMARD therapy (hydroxychloroquine ) for RA/SLE Dx.   Lumbar adjacent segment disease with spondylolisthesis    Multiple acquired cysts of kidney    On chronic clopidogrel  therapy    OSA (obstructive sleep apnea)    a.) does NOT use nocturnal pap therapy   Osteoarthritis    Pancreatic cyst    Peripheral vascular disease (HCC)    Pneumonia    Post-COVID chronic cough    Pulmonary nodule    RBBB (right bundle branch block)    Rectal bleeding    Rheumatoid arthritis (HCC)    a.) on DMARD; hydroxychloriquine   Shortness of breath 03/01/2023   Systemic lupus erythematosus (HCC)    T2DM (type 2 diabetes mellitus) (HCC)    Tendinitis of left wrist    Thickened  endometrium    Wears dentures    partial lower    Past Surgical History:  Procedure Laterality Date   ANEURYSM COILING  08/2013   ANTERIOR LATERAL LUMBAR FUSION WITH PERCUTANEOUS SCREW 1 LEVEL N/A 03/09/2023   Procedure: L3-4 LATERAL LUMBAR INTERBODY FUSION;  Surgeon: Jodeen Munch, MD;  Location: [facility];  Service: Neurosurgery;  Laterality: N/A;   APPLICATION OF INTRAOPERATIVE CT SCAN N/A 03/09/2023   Procedure: APPLICATION OF INTRAOPERATIVE CT SCAN;  Surgeon: Jodeen Munch, MD;  Location: [facility];  Service: Neurosurgery;  Laterality: N/A;   Attempted embolization of previously treated ACOM aneurysm; unsuccessful N/A 01/28/2012   [facility]   BACK SURGERY      BARTHOLIN CYST MARSUPIALIZATION     BICEPT TENODESIS Right 03/02/2022   Procedure: BICEPS TENODESIS;  Surgeon: Elner Hahn, MD;  Location: [facility];  Service: Orthopedics;  Laterality: Right;   BREAST CYST ASPIRATION Right 01/25/2012   FNA neg.   BREAST EXCISIONAL BIOPSY Left 06/26/2007   neg   BUNIONECTOMY Bilateral 2022   CARDIAC CATHETERIZATION Left 02/23/2007   Procedure: CARDIAC CATHETERIZATION; Location: [facility]; Surgeon: Debborah Fairly, MD   CARDIAC CATHETERIZATION Left 07/31/2007   Procedure: CARDIAC CATHETERIZATION; Location: [facility]; Surgeon: Debborah Fairly, MD   CARDIAC CATHETERIZATION Left 10/28/2010   Procedure: CARDIAC CATHETERIZATION; Location: [facility]; Surgeon: Debborah Fairly, MD   CARDIAC CATHETERIZATION Left 05/31/2013   Procedure: CARDIAC CATHETERIZATION; Location: [facility]; Surgeon: Debborah Fairly, MD   Carotid arteriogram; 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 N/A 07/18/2006   Location: [facility]   CATARACT EXTRACTION     CERVICAL FUSION N/A    Procedure: ACDF C5-C7   COLONOSCOPY WITH PROPOFOL  N/A 10/04/2019   Procedure: COLONOSCOPY WITH BIOPSY;  Surgeon: Selena Daily, MD;  Location: [facility];  Service: Endoscopy;  Laterality: N/A;  Diabetic (borderline) - oral meds priority 3   CORONARY STENT INTERVENTION Left 02/27/2007   Procedure: CORONARY STENT INTERVENTION (3.5 x 15 mm Vision BMS to mRCA); Location: [facility]; Surgeon: Antionette Kirks, MD   Endovascular near complete obliteration of enlarging neck remnant of previously treated RIGHT MCA aneurysm using stent assisted coiling N/A 09/03/2010   Location: [facility]   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 N/A 10/03/2006   Location: [facility]   HYSTEROSCOPY WITH D & C N/A 08/04/2021   Procedure: DILATATION AND CURETTAGE /HYSTEROSCOPY;  Surgeon: Heron Lord, MD;  Location: [facility];  Service: Gynecology;   Laterality: N/A;   POLYPECTOMY N/A 10/04/2019   Procedure: POLYPECTOMY;  Surgeon: Selena Daily, MD;  Location: [facility];  Service: Endoscopy;  Laterality: N/A;   REVERSE SHOULDER ARTHROPLASTY Right 03/02/2022   Procedure: REVERSE SHOULDER ARTHROPLASTY;  Surgeon: Elner Hahn, MD;  Location: [facility];  Service: Orthopedics;  Laterality: Right;   UPPER ESOPHAGEAL ENDOSCOPIC ULTRASOUND (EUS) N/A 04/15/2016   Procedure: UPPER ESOPHAGEAL ENDOSCOPIC ULTRASOUND (EUS);  Surgeon: Eloisa Hait, MD;  Location: [facility];  Service: Gastroenterology;  Laterality: N/A;    Social History   Socioeconomic History   Marital status: Widowed    Spouse name: Not on file   Number of children: Not on file   Years of education: Not on file   Highest education level: Not on file  Occupational History   Not on file  Tobacco Use   Smoking status: Never    Passive exposure: Never   Smokeless tobacco: Never  Vaping Use   Vaping status: Never Used  Substance and Sexual Activity   Alcohol use: Yes    Comment: Occasionally at christmas   Drug use: No   Sexual activity: Not Currently    Birth control/protection: Post-menopausal  Other Topics Concern   Not on file  Social History Narrative   Lives with daughter   Social Drivers of Health   Financial Resource Strain: Low Risk  (10/11/2023)   Overall Financial Resource Strain (CARDIA)    Difficulty of Paying Living Expenses: Not hard at all  Food Insecurity: No Food Insecurity (10/11/2023)   Hunger Vital Sign    Worried About Running Out of Food in the Last Year: Never true    Ran Out of Food in the Last Year: Never true  Transportation Needs: No Transportation Needs (10/11/2023)   PRAPARE - Administrator, Civil Service (Medical): No    Lack of Transportation (Non-Medical): No  Physical Activity: Not on file  Stress: No Stress Concern Present (10/11/2023)   [employer] of Occupational Health - Occupational  Stress Questionnaire    Feeling of Stress : Not at all  Social Connections: Moderately Isolated (10/11/2023)   Social Connection and Isolation Panel [NHANES]    Frequency of Communication with Friends and Family: More than three times a week    Frequency of Social Gatherings with Friends and Family: More than three times a week    Attends Religious Services: Never    Database administrator or Organizations: Yes    Attends Banker Meetings: Never    Marital Status: Widowed  Intimate Partner Violence: Not At Risk (10/11/2023)   Humiliation, Afraid, Rape, and Kick questionnaire    Fear of Current or Ex-Partner: No    Emotionally Abused: No    Physically Abused: No    Sexually Abused: No    Family History  Problem Relation Age of Onset   Breast cancer Paternal Aunt 31   Breast cancer Paternal Aunt    Heart disease Mother    Heart disease Father    Stroke Father    Diabetes Sister    Diabetes Brother     Allergies  Allergen Reactions   Isosorbide Dinitrate Other (See Comments)    Collapse   Amlodipine Swelling   Sulfa Antibiotics Itching   Tizanidine Hcl    Oxycodone  Itching   Sulfasalazine Itching    Outpatient Medications Prior to Visit  Medication Sig   allopurinol  (ZYLOPRIM ) 100 MG tablet TAKE 1 TABLET BY MOUTH EVERY DAY   clotrimazole -betamethasone (LOTRISONE) cream APPLY TWICE A DAY TO GROIN AREA SKIN FOR 1 WEEK AND THEN AS NEEDED.   fluticasone  (FLONASE ) 50 MCG/ACT nasal spray Place 2 sprays into both nostrils daily. SPRAY 2 SPRAYS INTO EACH NOSTRIL EVERY DAY   furosemide  (LASIX ) 40 MG tablet TAKE 1 TABLET BY MOUTH EVERY DAY   glimepiride  (AMARYL ) 2 MG tablet TAKE 1 TABLET (2 MG TOTAL) BY MOUTH SEE ADMIN INSTRUCTIONS. TAKE 1 MG DAILY, MAY INCREASE TO 2 MG IF BLOOD SUGAR IS OVER 175   hydroxychloroquine  (PLAQUENIL ) 200 MG tablet Take 200 mg by mouth daily.   labetalol  (NORMODYNE ) 300 MG tablet TAKE 1 TABLET BY MOUTH TWICE A DAY   losartan  (COZAAR ) 100 MG  tablet TAKE 1 TABLET BY MOUTH EVERY DAY IN THE MORNING   methocarbamol  (ROBAXIN ) 500 MG tablet TAKE 1 TABLET BY MOUTH EVERY 6 HOURS AS NEEDED FOR MUSCLE SPASMS.   nitroGLYCERIN  (NITROSTAT ) 0.6 MG SL tablet TAKE 1 TABLET BY MOUTH AS NEEDED FOR CP.  WAIT 5 MINUTES BEFORE NEXT DOSE. PROCEED TO ER IF NO RELIEF AFTER 3 DOSES   pantoprazole (PROTONIX) 40 MG tablet TAKE 1 TABLET BY MOUTH EVERY DAY   rosuvastatin  (CRESTOR ) 40 MG tablet TAKE 1 TABLET BY MOUTH EVERY DAY   SYMBICORT 80-4.5 MCG/ACT inhaler INHALE 1 PUFF BY MOUTH TWICE A DAY   traMADol  (ULTRAM ) 50 MG tablet Take 1 tablet (50 mg total) by mouth every 6 (six) hours as needed for moderate pain (pain score 4-6).   tretinoin (RETIN-A) 0.05 % cream APPLY TO AFFECTED AREA EVERY DAY AT BEDTIME   [DISCONTINUED] hydrALAZINE  (APRESOLINE ) 100 MG tablet TAKE 1 TABLET BY MOUTH TWICE A DAY (Patient taking differently: Take 100 mg by mouth 2 (two) times daily. Takes/1/2 tablet BID)   [DISCONTINUED] zolpidem  (AMBIEN ) 5 MG tablet Take 1 tablet (5 mg total) by mouth at bedtime as needed for sleep.   ezetimibe  (ZETIA ) 10 MG tablet Take 1 tablet (10 mg total) by mouth daily. (Patient not taking: Reported on 11/24/2023)   gabapentin  (NEURONTIN ) 100 MG capsule Take 1-3 capsules (100-300 mg total) by mouth at bedtime.   senna (SENOKOT) 8.6 MG TABS tablet Take 1 tablet (8.6 mg total) by mouth daily as needed for mild constipation. (Patient not taking: Reported on 11/24/2023)   No facility-administered medications prior to visit.    Review of Systems  Constitutional: Negative.  Negative for chills, fever, malaise/fatigue and weight loss.  HENT: Negative.  Negative for congestion and ear discharge.   Eyes: Negative.   Respiratory: Negative.  Negative for cough and shortness of breath.   Cardiovascular: Negative.  Negative for chest pain, palpitations and leg swelling.  Gastrointestinal: Negative.  Negative for abdominal pain, constipation, diarrhea, heartburn, nausea and  vomiting.  Genitourinary: Negative.  Negative for dysuria and flank pain.  Musculoskeletal: Negative.  Negative for joint pain and myalgias.  Skin: Negative.   Neurological: Negative.  Negative for dizziness, tingling and headaches.  Endo/Heme/Allergies: Negative.   Psychiatric/Behavioral: Negative.  Negative for depression and suicidal ideas. The patient is not nervous/anxious.        Objective:   BP (!) 144/86   Pulse 65   Ht 5\' 3"  (1.6 m)   Wt 179 lb 3.2 oz (81.3 kg)   SpO2 97%   BMI 31.74 kg/m   Vitals:   11/24/23 1436  BP: (!) 144/86  Pulse: 65  Height: 5\' 3"  (1.6 m)  Weight: 179 lb 3.2 oz (81.3 kg)  SpO2: 97%  BMI (Calculated): 31.75    Physical Exam Vitals and nursing note reviewed.  Constitutional:      Appearance: Normal appearance.  HENT:     Head: Normocephalic and atraumatic.     Nose: Nose normal.     Mouth/Throat:     Mouth: Mucous membranes are moist.     Pharynx: Oropharynx is clear.  Eyes:     Conjunctiva/sclera: Conjunctivae normal.     Pupils: Pupils are equal, round, and reactive to light.  Cardiovascular:     Rate and Rhythm: Normal rate and regular rhythm.     Pulses: Normal pulses.     Heart sounds: Normal heart sounds. No murmur heard. Pulmonary:     Effort: Pulmonary effort is normal.     Breath sounds: Normal breath sounds. No wheezing.  Abdominal:     General: Bowel sounds are normal.     Palpations: Abdomen is soft.     Tenderness: There is no abdominal tenderness. There is no right CVA tenderness or  left CVA tenderness.  Musculoskeletal:        General: Normal range of motion.     Cervical back: Normal range of motion.     Right lower leg: No edema.     Left lower leg: No edema.  Skin:    General: Skin is warm and dry.  Neurological:     General: No focal deficit present.     Mental Status: She is alert and oriented to person, place, and time.  Psychiatric:        Mood and Affect: Mood normal.        Behavior: Behavior  normal.      Results for orders placed or performed in visit on 11/24/23  POCT CBG (Fasting - Glucose)  Result Value Ref Range   Glucose Fasting, POC 231 (A) 70 - 99 mg/dL    Recent Results (from the past 2160 hours)  CMP14+EGFR     Status: Abnormal   Collection Time: 10/11/23  1:56 PM  Result Value Ref Range   Glucose 101 (H) 70 - 99 mg/dL   BUN 57 (H) 8 - 27 mg/dL   Creatinine, Ser 4.09 (H) 0.57 - 1.00 mg/dL   eGFR 15 (L) >81 XB/JYN/8.29   BUN/Creatinine Ratio 19 12 - 28   Sodium 148 (H) 134 - 144 mmol/L   Potassium 4.2 3.5 - 5.2 mmol/L   Chloride 112 (H) 96 - 106 mmol/L   CO2 21 20 - 29 mmol/L   Calcium  9.2 8.7 - 10.3 mg/dL   Total Protein 5.9 (L) 6.0 - 8.5 g/dL   Albumin  3.9 3.8 - 4.8 g/dL   Globulin, Total 2.0 1.5 - 4.5 g/dL   Bilirubin Total 0.4 0.0 - 1.2 mg/dL   Alkaline Phosphatase 80 44 - 121 IU/L   AST 15 0 - 40 IU/L   ALT 11 0 - 32 IU/L  CBC with Diff     Status: Abnormal   Collection Time: 10/11/23  1:56 PM  Result Value Ref Range   WBC 5.9 3.4 - 10.8 x10E3/uL   RBC 3.47 (L) 3.77 - 5.28 x10E6/uL   Hemoglobin 10.3 (L) 11.1 - 15.9 g/dL   Hematocrit 56.2 (L) 13.0 - 46.6 %   MCV 92 79 - 97 fL   MCH 29.7 26.6 - 33.0 pg   MCHC 32.2 31.5 - 35.7 g/dL   RDW 86.5 78.4 - 69.6 %   Platelets 188 150 - 450 x10E3/uL   Neutrophils 59 Not Estab. %   Lymphs 31 Not Estab. %   Monocytes 5 Not Estab. %   Eos 4 Not Estab. %   Basos 1 Not Estab. %   Neutrophils Absolute 3.5 1.4 - 7.0 x10E3/uL   Lymphocytes Absolute 1.8 0.7 - 3.1 x10E3/uL   Monocytes Absolute 0.3 0.1 - 0.9 x10E3/uL   EOS (ABSOLUTE) 0.2 0.0 - 0.4 x10E3/uL   Basophils Absolute 0.0 0.0 - 0.2 x10E3/uL   Immature Granulocytes 0 Not Estab. %   Immature Grans (Abs) 0.0 0.0 - 0.1 x10E3/uL  Lipid Panel w/o Chol/HDL Ratio     Status: Abnormal   Collection Time: 10/11/23  1:56 PM  Result Value Ref Range   Cholesterol, Total 279 (H) 100 - 199 mg/dL   Triglycerides 295 (H) 0 - 149 mg/dL   HDL 38 (L) >28 mg/dL    VLDL Cholesterol Cal 57 (H) 5 - 40 mg/dL   LDL Chol Calc (NIH) 413 (H) 0 - 99 mg/dL  Hemoglobin K4M  Status: Abnormal   Collection Time: 10/11/23  1:56 PM  Result Value Ref Range   Hgb A1c MFr Bld 6.1 (H) 4.8 - 5.6 %    Comment:          Prediabetes: 5.7 - 6.4          Diabetes: >6.4          Glycemic control for adults with diabetes: <7.0    Est. average glucose Bld gHb Est-mCnc 128 mg/dL  POCT CBG (Fasting - Glucose)     Status: Abnormal   Collection Time: 10/18/23  1:07 PM  Result Value Ref Range   Glucose Fasting, POC 147 (A) 70 - 99 mg/dL  POCT CBG (Fasting - Glucose)     Status: Abnormal   Collection Time: 11/17/23 11:23 AM  Result Value Ref Range   Glucose Fasting, POC 116 (A) 70 - 99 mg/dL  POCT CBG (Fasting - Glucose)     Status: Abnormal   Collection Time: 11/24/23  2:41 PM  Result Value Ref Range   Glucose Fasting, POC 231 (A) 70 - 99 mg/dL      Assessment & Plan:  Continue current medications.  Eye exam to be scheduled. Problem List Items Addressed This Visit     Rheumatoid arthritis involving multiple sites with positive rheumatoid factor (HCC)   Type 2 diabetes mellitus with hyperglycemia, without long-term current use of insulin  (HCC)   Relevant Orders   POCT CBG (Fasting - Glucose) (Completed)   Hypertension associated with diabetes (HCC) - Primary   Relevant Medications   hydrALAZINE  (APRESOLINE ) 100 MG tablet   Combined hyperlipidemia associated with type 2 diabetes mellitus (HCC)   Relevant Medications   hydrALAZINE  (APRESOLINE ) 100 MG tablet   Chronic kidney disease, stage IV (severe) (HCC)   Other Visit Diagnoses       Primary insomnia         Episodic paroxysmal hemicrania, not intractable           Return in about 3 months (around 02/24/2024).   Total time spent: 30 minutes  Aisha Hove, MD  11/24/2023   This document may have been prepared by [facility] Voice Recognition software and as such may include unintentional dictation errors.

## 2023-11-28 DIAGNOSIS — N2581 Secondary hyperparathyroidism of renal origin: Secondary | ICD-10-CM | POA: Diagnosis not present

## 2023-11-28 DIAGNOSIS — I129 Hypertensive chronic kidney disease with stage 1 through stage 4 chronic kidney disease, or unspecified chronic kidney disease: Secondary | ICD-10-CM | POA: Diagnosis not present

## 2023-11-28 DIAGNOSIS — N184 Chronic kidney disease, stage 4 (severe): Secondary | ICD-10-CM | POA: Diagnosis not present

## 2023-11-28 DIAGNOSIS — D631 Anemia in chronic kidney disease: Secondary | ICD-10-CM | POA: Diagnosis not present

## 2023-11-30 ENCOUNTER — Other Ambulatory Visit: Payer: Self-pay | Admitting: Internal Medicine

## 2023-12-01 ENCOUNTER — Encounter: Payer: Self-pay | Admitting: Surgery

## 2023-12-01 ENCOUNTER — Ambulatory Visit: Admitting: Surgery

## 2023-12-01 VITALS — BP 140/75 | HR 63 | Ht 63.0 in | Wt 172.0 lb

## 2023-12-01 DIAGNOSIS — K8010 Calculus of gallbladder with chronic cholecystitis without obstruction: Secondary | ICD-10-CM

## 2023-12-01 DIAGNOSIS — D136 Benign neoplasm of pancreas: Secondary | ICD-10-CM

## 2023-12-01 NOTE — Progress Notes (Signed)
 Patient ID: Delos Fick, female   DOB: May 29, 1946, 78 y.o.   MRN: [id number]  Chief Complaint: Gallstones, questionably symptomatic.  History of Present Illness Maija EMY ANGEVINE is a 78 y.o. female with a long medical history as noted below, most recently underwent back surgery.  Has some lingering right sided back and side issues since.  She says she has a very high oil/fried food diet eating primarily [place] and [place] cuisine.  She reports this pain does not seem to be exacerbated by eating at all.  She denies any history of nausea postprandially, denies the presence of heartburn.  But does note that the ingestion of milk gives her significant gas.  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 b.)07/18/2006 -endovas oblit complex ACOM aneur. c.) Endovas Tx of 6.8x42mm RMCA aneur. d.) 3.5x64mm remnant RMCA aneur 2/2 coil compaction. e.) Interval 3.2x3.17mm saccular outpouching in ACOM c/w mild recannulization in neck. 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 g.) 3.7x50mm remnant of prev Tx'd ACOM aneur -failed embol 02/24/2012.   CKD (chronic kidney disease), stage IV (HCC)    a.) solitary functioning kidney on the RIGHT   Complication of anesthesia    a.) delayed emergence   Coronary artery disease 02/23/2007   a.) LHC 02/23/2007 --> EF 50%; 30% pLAD, 70% mRCA --> planned for staged PCI. b.) PCI 02/27/2007: EF 60%; 3.5 x 15 mm Vision BMS to 80% mRCA. b.) LHC 07/24/2007: EF 60%; minor irregs; no occlusive CAD; no intervention. c.) LHC 10/28/2010: EF 60%; 30% mLAD, LCx with minor luminal irregs, 20% ISR mRCA; no intervention. d.) LHC 05/31/2013: EF 60%; 40% mLAD, 30% ISR m-dRCA; no interventions.   DDD (degenerative disc disease), cervical    a.) s/p ACDF C5-C7; hardware in neck; patient appreciates stiffness and issues with mobility    Diastolic dysfunction    a.)  TTE 07/07/2021: EF 87.8%; normal PASP; trace TR/MR; G1DD.   Elevated LEFT hemidiaphragm    Gout    Headache(784.0)    Heart murmur    HLD (hyperlipidemia)    HOH (hard of hearing)    Has hearing aids, doesn't wear   Hyperkalemia    Hyperparathyroidism due to renal insufficiency (HCC)    Hypertension    Incomplete right bundle branch block (RBBB)    Insomnia    a.) takes zolpidem    Long term (current) use of immunomodulator    a.) on DMARD therapy (hydroxychloroquine ) for RA/SLE Dx.   Lumbar adjacent segment disease with spondylolisthesis    Multiple acquired cysts of kidney    On chronic clopidogrel  therapy    OSA (obstructive sleep apnea)    a.) does NOT use nocturnal pap therapy   Osteoarthritis    Pancreatic cyst    Peripheral vascular disease (HCC)    Pneumonia    Post-COVID chronic cough    Pulmonary nodule    RBBB (right bundle branch block)    Rectal bleeding    Rheumatoid arthritis (HCC)    a.) on DMARD; hydroxychloriquine   Shortness of breath 03/01/2023   Systemic lupus erythematosus (HCC)    T2DM (type 2 diabetes mellitus) (HCC)    Tendinitis of left wrist    Thickened endometrium    Wears dentures    partial lower      Past Surgical History:  Procedure Laterality  Date   ANEURYSM COILING  08/2013   ANTERIOR LATERAL LUMBAR FUSION WITH PERCUTANEOUS SCREW 1 LEVEL N/A 03/09/2023   Procedure: L3-4 LATERAL LUMBAR INTERBODY FUSION;  Surgeon: Jodeen Munch, MD;  Location: [facility];  Service: Neurosurgery;  Laterality: N/A;   APPLICATION OF INTRAOPERATIVE CT SCAN N/A 03/09/2023   Procedure: APPLICATION OF INTRAOPERATIVE CT SCAN;  Surgeon: Jodeen Munch, MD;  Location: [facility];  Service: Neurosurgery;  Laterality: N/A;   Attempted embolization of previously treated ACOM aneurysm; unsuccessful N/A 01/28/2012   Location: [facility]   BACK SURGERY     BARTHOLIN CYST MARSUPIALIZATION     BICEPT TENODESIS Right  03/02/2022   Procedure: BICEPS TENODESIS;  Surgeon: Elner Hahn, MD;  Location: [facility];  Service: Orthopedics;  Laterality: Right;   BREAST CYST ASPIRATION Right 01/25/2012   FNA neg.   BREAST EXCISIONAL BIOPSY Left 06/26/2007   neg   BUNIONECTOMY Bilateral 2022   CARDIAC CATHETERIZATION Left 02/23/2007   Procedure: CARDIAC CATHETERIZATION; Location: [facility]; Surgeon: Debborah Fairly, MD   CARDIAC CATHETERIZATION Left 07/31/2007   Procedure: CARDIAC CATHETERIZATION; Location: [facility]; Surgeon: Debborah Fairly, MD   CARDIAC CATHETERIZATION Left 10/28/2010   Procedure: CARDIAC CATHETERIZATION; Location: [facility]; Surgeon: Debborah Fairly, MD   CARDIAC CATHETERIZATION Left 05/31/2013   Procedure: CARDIAC CATHETERIZATION; Location: [facility]; Surgeon: Debborah Fairly, MD   Carotid arteriogram; 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 N/A 07/18/2006   Location: [facility]   CATARACT EXTRACTION     CERVICAL FUSION N/A    Procedure: ACDF C5-C7   COLONOSCOPY WITH PROPOFOL  N/A 10/04/2019   Procedure: COLONOSCOPY WITH BIOPSY;  Surgeon: Selena Daily, MD;  Location: [facility];  Service: Endoscopy;  Laterality: N/A;  Diabetic (borderline) - oral meds priority 3   CORONARY STENT INTERVENTION Left 02/27/2007   Procedure: CORONARY STENT INTERVENTION (3.5 x 15 mm Vision BMS to mRCA); Location: [facility]; Surgeon: Antionette Kirks, MD   Endovascular near complete obliteration of enlarging neck remnant of previously treated RIGHT MCA aneurysm using stent assisted coiling N/A 09/03/2010   Location: [facility]   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 N/A 10/03/2006   Location: [facility]   HYSTEROSCOPY WITH D & C N/A 08/04/2021   Procedure: DILATATION AND CURETTAGE /HYSTEROSCOPY;  Surgeon: Heron Lord, MD;  Location: [facility];  Service: Gynecology;  Laterality: N/A;   POLYPECTOMY N/A 10/04/2019   Procedure:  POLYPECTOMY;  Surgeon: Selena Daily, MD;  Location: [facility];  Service: Endoscopy;  Laterality: N/A;   REVERSE SHOULDER ARTHROPLASTY Right 03/02/2022   Procedure: REVERSE SHOULDER ARTHROPLASTY;  Surgeon: Elner Hahn, MD;  Location: [facility];  Service: Orthopedics;  Laterality: Right;   UPPER ESOPHAGEAL ENDOSCOPIC ULTRASOUND (EUS) N/A 04/15/2016   Procedure: UPPER ESOPHAGEAL ENDOSCOPIC ULTRASOUND (EUS);  Surgeon: Eloisa Hait, MD;  Location: [facility];  Service: Gastroenterology;  Laterality: N/A;    Allergies  Allergen Reactions   Isosorbide Dinitrate Other (See Comments)    Collapse   Amlodipine Swelling   Sulfa Antibiotics Itching   Tizanidine Hcl    Oxycodone  Itching   Sulfasalazine Itching    Current Outpatient Medications  Medication Sig Dispense Refill   allopurinol  (ZYLOPRIM ) 100 MG tablet TAKE 1 TABLET BY MOUTH EVERY DAY 90 tablet 3   clotrimazole -betamethasone (LOTRISONE) cream APPLY TWICE A DAY TO GROIN AREA SKIN FOR 1 WEEK AND THEN AS NEEDED. 45 g 1   ezetimibe  (ZETIA ) 10 MG tablet Take 1 tablet (10  mg total) by mouth daily. 90 tablet 3   fluticasone  (FLONASE ) 50 MCG/ACT nasal spray Place 2 sprays into both nostrils daily. SPRAY 2 SPRAYS INTO EACH NOSTRIL EVERY DAY 48 mL 3   furosemide  (LASIX ) 40 MG tablet TAKE 1 TABLET BY MOUTH EVERY DAY 90 tablet 25   gabapentin  (NEURONTIN ) 100 MG capsule Take 1-3 capsules (100-300 mg total) by mouth at bedtime. 135 capsule 0   glimepiride  (AMARYL ) 2 MG tablet TAKE 1 TABLET (2 MG TOTAL) BY MOUTH SEE ADMIN INSTRUCTIONS. TAKE 1 MG DAILY, MAY INCREASE TO 2 MG IF BLOOD SUGAR IS OVER 175 180 tablet 3   hydrALAZINE  (APRESOLINE ) 100 MG tablet Take 1 tablet (100 mg total) by mouth 2 (two) times daily. 180 tablet 1   hydroxychloroquine  (PLAQUENIL ) 200 MG tablet Take 200 mg by mouth daily.     labetalol  (NORMODYNE ) 300 MG tablet TAKE 1 TABLET BY MOUTH TWICE A DAY 180 tablet 3   losartan  (COZAAR ) 100 MG tablet TAKE 1  TABLET BY MOUTH EVERY DAY IN THE MORNING 90 tablet 3   methocarbamol  (ROBAXIN ) 500 MG tablet TAKE 1 TABLET BY MOUTH EVERY 6 HOURS AS NEEDED FOR MUSCLE SPASMS. 120 tablet 0   nitroGLYCERIN  (NITROSTAT ) 0.6 MG SL tablet TAKE 1 TABLET BY MOUTH AS NEEDED FOR CP. WAIT 5 MINUTES BEFORE NEXT DOSE. PROCEED TO ER IF NO RELIEF AFTER 3 DOSES 100 tablet 1   pantoprazole (PROTONIX) 40 MG tablet TAKE 1 TABLET BY MOUTH EVERY DAY 90 tablet 3   rosuvastatin  (CRESTOR ) 40 MG tablet TAKE 1 TABLET BY MOUTH EVERY DAY 90 tablet 0   senna (SENOKOT) 8.6 MG TABS tablet Take 1 tablet (8.6 mg total) by mouth daily as needed for mild constipation. 30 tablet 0   SYMBICORT 80-4.5 MCG/ACT inhaler INHALE 1 PUFF BY MOUTH TWICE A DAY 10.2 each 2   traMADol  (ULTRAM ) 50 MG tablet Take 1 tablet (50 mg total) by mouth every 6 (six) hours as needed for moderate pain (pain score 4-6). 30 tablet 0   tretinoin (RETIN-A) 0.05 % cream APPLY TO AFFECTED AREA EVERY DAY AT BEDTIME 45 g 3   zolpidem  (AMBIEN ) 5 MG tablet Take 1 tablet (5 mg total) by mouth at bedtime as needed for sleep. 30 tablet 0   No current facility-administered medications for this visit.    Family History Family History  Problem Relation Age of Onset   Breast cancer Paternal Aunt 57   Breast cancer Paternal Aunt    Heart disease Mother    Heart disease Father    Stroke Father    Diabetes Sister    Diabetes Brother       Social History Social History   Tobacco Use   Smoking status: Never    Passive exposure: Never   Smokeless tobacco: Never  Vaping Use   Vaping status: Never Used  Substance Use Topics   Alcohol use: Yes    Comment: Occasionally at christmas   Drug use: No        Review of Systems  Constitutional: Negative.   HENT:  Positive for hearing loss.   Eyes:  Positive for blurred vision.  Respiratory:  Positive for shortness of breath.   Cardiovascular:  Positive for chest pain and leg swelling.  Gastrointestinal:  Positive for  abdominal pain.  Genitourinary:  Positive for flank pain.  Skin: Negative.   Neurological:  Positive for headaches.  Psychiatric/Behavioral: Negative.       Physical Exam Blood pressure (!) 140/75, pulse  63, height 5' 3 (1.6 m), weight 172 lb (78 kg), SpO2 98%. Last Weight  Most recent update: 12/01/2023  3:38 PM    Weight  78 kg (172 lb)             CONSTITUTIONAL: Well developed, and nourished, appropriately responsive and aware without distress.   EYES: Sclera non-icteric.   EARS, NOSE, MOUTH AND THROAT:  The oropharynx is clear. Oral mucosa is pink and moist.  Hearing is intact to voice.  NECK: Trachea is midline, and there is no jugular venous distension.  LYMPH NODES:  Lymph nodes in the neck are not appreciated. RESPIRATORY:   Normal respiratory effort without pathologic use of accessory muscles. CARDIOVASCULAR:  Well perfused.  GI: The abdomen is  soft, nontender, and nondistended. There were no palpable masses.  I did not appreciate hepatosplenomegaly.  MUSCULOSKELETAL: There are 3 well-healed parallel scars in the lumbar back region.  There is a short 3 cm transverse scar over the right lowermost ribs. SKIN: Skin turgor is normal. No pathologic skin lesions appreciated.  NEUROLOGIC:  Motor and sensation appear grossly normal.  Cranial nerves are grossly without defect. PSYCH:  Alert and oriented to person, place and time. Affect is appropriate for situation.  Data Reviewed I have personally reviewed what is currently available of the patient's imaging, recent labs and medical records.   Labs:     Latest Ref Rng & Units 10/11/2023    1:56 PM 06/21/2023    9:10 AM 03/13/2023    8:01 AM  CBC  WBC 3.4 - 10.8 x10E3/uL 5.9  5.2  9.3   Hemoglobin 11.1 - 15.9 g/dL 40.9  9.0  8.4   Hematocrit 34.0 - 46.6 % 32.0  29.1  26.4   Platelets 150 - 450 x10E3/uL 188  173  143       Latest Ref Rng & Units 10/11/2023    1:56 PM 08/05/2023    2:08 PM 06/21/2023    9:10 AM  CMP   Glucose 70 - 99 mg/dL 811  914  72   BUN 8 - 27 mg/dL 57  41  55   Creatinine 0.57 - 1.00 mg/dL 7.82  9.56  2.13   Sodium 134 - 144 mmol/L 148  148  147   Potassium 3.5 - 5.2 mmol/L 4.2  5.2  4.5   Chloride 96 - 106 mmol/L 112  113  117   CO2 20 - 29 mmol/L 21  22  18    Calcium  8.7 - 10.3 mg/dL 9.2  9.6  9.3   Total Protein 6.0 - 8.5 g/dL 5.9  5.9  5.8   Total Bilirubin 0.0 - 1.2 mg/dL 0.4  0.7  0.5   Alkaline Phos 44 - 121 IU/L 80  81  85   AST 0 - 40 IU/L 15  13  13    ALT 0 - 32 IU/L 11  6  11      Imaging: Radiological images reviewed:  PROCEDURE: ULTRASOUND ABDOMEN COMPLETE   HISTORY: Patient is a 78 y/o Female with gallstones.Abdominal pain for five months.   COMPARISON: CT AP 03/13/2023, MRI abdomen 12/28/2022   TECHNIQUE: Two-dimensional grayscale and color Doppler ultrasound of the abdomen was performed.   FINDINGS: The pancreas demonstrates a normal homogenous echotexture with the tail suboptimally visualized due to overlying bowel gas. There is a heterogeneous lesion with internal vascularity measuring 5.4 x 5.0 cm again identified within the head of the pancreas.   The liver demonstrates a  normal echotexture without intrahepatic biliary dilatation. No masses are visualized. The main portal vein demonstrates normal hepatopedal flow.   The gallbladder partially contracted and demonstrates multiple gallstones without pericholecystic fluid or wall thickening. Echogenic foci with ring down artifact is identified along the anterior wall, suggestive of adenomyomatosis. The common bile duct measures 0.4 cm. Negative sonographic Murphy's sign.   The right kidney measures 12.2 cm in length. Renal cortical echotexture is within normal limits. There is no hydronephrosis. There are no stones. There are multiple cysts with the largest measuring 7.5 cm at the superior pole.   The left kidney is absent.   The spleen measures 9.0 cm in length and demonstrates  normal echotexture.   There is no evidence of aneurysm within the visualized segments of the abdominal aorta.   The visualized segments of the IVC are unremarkable.   IMPRESSION: 1. 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 Concordant with prior imaging.   2. Partially contracted gallbladder with multiple gallstones and possible adenomyomatosis. No biliary dilatation.   3.  Multiple right renal cysts.   4.  Absent left kidney.   Thank you for allowing us  to assist in the care of this patient.     Electronically Signed   By: Beula Brunswick M.D.   On: 08/02/2023 16:06  CLINICAL DATA:  Pancreatic mass with apparent enlargement by prior  lumbar spine MR   EXAM:  MRI ABDOMEN WITHOUT AND WITH CONTRAST   TECHNIQUE:  Multiplanar multisequence MR imaging of the abdomen was performed  both before and after the administration of intravenous contrast.   CONTRAST:  8 mL Vueway  gadolinium contrast IV   COMPARISON:  MR lumbar spine, 11/25/2022, MR abdomen, 09/22/2019   FINDINGS:  Lower chest: No acute abnormality.   Hepatobiliary: No solid liver abnormality is seen. Sludge contracted  in the gallbladder. No gallstones, gallbladder wall thickening, or  biliary dilatation.   Pancreas: Large, complex, multiseptated cystic lesion of the central  pancreatic head with hemorrhagic or proteinaceous contents and  numerous contrast enhancing internal septation, measuring 4.7 x 4.3  cm (series 9, image 22). This is significantly enlarged in  comparison to prior examination dated 09/22/2019, at which time it  measured 2.4 x 2.2 cm. No pancreatic ductal dilatation or  surrounding inflammatory changes.   Spleen: Normal in size without significant abnormality.   Adrenals/Urinary Tract: Unchanged, definitively benign macroscopic  fat containing right adrenal adenoma, for which no further follow-up  or characterization is required. Normal left adrenal gland. Severely  atrophic, dysplastic left  kidney. Multiple simple, benign right  renal cortical cysts, as well as intrinsically T1 hyperintense,  nonenhancing hemorrhagic or proteinaceous cysts.   Stomach/Bowel: Stomach is within normal limits. No evidence of bowel  wall thickening, distention, or inflammatory changes.   Vascular/Lymphatic: No significant vascular findings are present. No  enlarged abdominal lymph nodes.   Other: No abdominal wall hernia or abnormality. No ascites.   Musculoskeletal: No acute or significant osseous findings.   IMPRESSION:  1. Large, complex, multiseptated cystic lesion of the central  pancreatic head with hemorrhagic or proteinaceous contents and  numerous contrast enhancing internal septations, measuring 4.7 x 4.3  cm. This is significantly enlarged in comparison to prior  examination, and appearance and patient demographic most likely  reflects a serous cystadenoma.  2. Severely atrophic, dysplastic left kidney.  3. Sludge in the gallbladder.   Electronically Signed    By: Fredricka Jenny M.D.    On: 12/30/2022 22:28   Within  last 24 hrs: No results found.  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   Lumbar facet arthropathy 08/04/2023   Compression of lumbar vertebra (HCC) 08/04/2023   Chronic pain syndrome 08/04/2023   Chronic hip pain, right 08/04/2023   Hypertension associated with diabetes (HCC) 05/31/2023   Acute pain of right knee 05/31/2023   Absolute anemia  05/31/2023   History of lumbar fusion 03/09/2023   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   Allergy to mold 03/01/2023   Shortness of breath 03/01/2023   Type 2 diabetes mellitus with hyperglycemia, without long-term current use of insulin  (HCC) 03/01/2023   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 01/11/2023   Breast cancer screening by mammogram 12/16/2022   Acute cough 12/02/2022   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   Seasonal allergic rhinitis due to pollen 08/26/2022   Essential hypertension, benign 08/26/2022   Mixed hyperlipidemia 08/26/2022   Rheumatoid arthritis involving multiple sites with positive rheumatoid factor (HCC) 08/26/2022   Tendinitis of left wrist 08/26/2022   Status post reverse total shoulder replacement, 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   We discussed robotic cholecystectomy, and the likelihood of successful surgery without an open operation. This was discussed thoroughly.   Risks and benefits have been discussed with the patient which include but are not limited to anesthesia, bleeding, infection, biliary ductal injury, resulting in leak or stenosis, other associated unanticipated injuries affiliated with laparoscopic surgery.   Reviewed that removing the gallbladder will only address the symptoms related to the gallbladder itself.  I believe there is no desire to proceed at this time, accepting the risks of delaying treatment with understanding.  Opts for further imaging.  Questions elicited and answered to satisfaction.    No guarantees  ever expressed or implied.  Face-to-face time spent with the patient and accompanying care providers(if present) was 40 minutes, spent counseling, educating, and coordinating care of the patient.    These notes generated with voice recognition software. I apologize for typographical errors.  Flynn Hylan M.D., FACS 12/01/2023, 10:12 PM

## 2023-12-01 NOTE — Patient Instructions (Addendum)
 Follow up here in 1 month.  We will get you scheduled for an MRCP scan.   We want you to get blood work done. You may do this anytime at [facility]. Enter in through the [facility] entrance and let them know you are there for lab work.  You are scheduled for a MRCP scan at [facility] on 12/08/23. You will need to arrive at the Medical Mall entrance and check in at 9:15 am. You may have nothing to eat or drink for 4 hours prior to this scan.

## 2023-12-05 ENCOUNTER — Other Ambulatory Visit
Admission: RE | Admit: 2023-12-05 | Discharge: 2023-12-05 | Disposition: A | Discharge status: Home / Self Care | Source: Ambulatory Visit | Attending: Surgery | Admitting: Surgery

## 2023-12-05 DIAGNOSIS — K8010 Calculus of gallbladder with chronic cholecystitis without obstruction: Secondary | ICD-10-CM | POA: Insufficient documentation

## 2023-12-05 LAB — COMPREHENSIVE METABOLIC PANEL WITH GFR
ALT: 15 U/L (ref 0–44)
AST: 17 U/L (ref 15–41)
Albumin: 3.6 g/dL (ref 3.5–5.0)
Alkaline Phosphatase: 50 U/L (ref 38–126)
Anion gap: 8 (ref 5–15)
BUN: 90 mg/dL — ABNORMAL HIGH (ref 8–23)
CO2: 20 mmol/L — ABNORMAL LOW (ref 22–32)
Calcium: 8.7 mg/dL — ABNORMAL LOW (ref 8.9–10.3)
Chloride: 119 mmol/L — ABNORMAL HIGH (ref 98–111)
Creatinine, Ser: 3.17 mg/dL — ABNORMAL HIGH (ref 0.44–1.00)
GFR, Estimated: 15 mL/min — ABNORMAL LOW (ref >60)
Glucose, Bld: 139 mg/dL — ABNORMAL HIGH (ref 70–99)
Potassium: 4.5 mmol/L (ref 3.5–5.1)
Sodium: 147 mmol/L — ABNORMAL HIGH (ref 135–145)
Total Bilirubin: 1 mg/dL (ref 0.0–1.2)
Total Protein: 6.1 g/dL — ABNORMAL LOW (ref 6.5–8.1)

## 2023-12-08 ENCOUNTER — Encounter: Payer: Self-pay | Admitting: *Deleted

## 2023-12-08 ENCOUNTER — Other Ambulatory Visit: Payer: Self-pay | Admitting: *Deleted

## 2023-12-08 ENCOUNTER — Ambulatory Visit
Admission: RE | Admit: 2023-12-08 | Discharge: 2023-12-08 | Disposition: A | Discharge status: Home / Self Care | Source: Ambulatory Visit | Attending: Surgery | Admitting: Surgery

## 2023-12-08 ENCOUNTER — Other Ambulatory Visit: Payer: Self-pay | Admitting: Surgery

## 2023-12-08 DIAGNOSIS — K8020 Calculus of gallbladder without cholecystitis without obstruction: Secondary | ICD-10-CM | POA: Diagnosis not present

## 2023-12-08 DIAGNOSIS — K8689 Other specified diseases of pancreas: Secondary | ICD-10-CM | POA: Diagnosis not present

## 2023-12-08 DIAGNOSIS — Q614 Renal dysplasia: Secondary | ICD-10-CM | POA: Diagnosis not present

## 2023-12-08 DIAGNOSIS — K8010 Calculus of gallbladder with chronic cholecystitis without obstruction: Secondary | ICD-10-CM | POA: Diagnosis present

## 2023-12-08 DIAGNOSIS — N261 Atrophy of kidney (terminal): Secondary | ICD-10-CM | POA: Diagnosis not present

## 2023-12-08 MED ORDER — GADOBUTROL 1 MMOL/ML IV SOLN
7.5000 mL | Freq: Once | INTRAVENOUS | Status: AC | PRN
  Administered 2023-12-08: 7.5 mL via INTRAVENOUS

## 2023-12-08 NOTE — Patient Instructions (Signed)
 Visit Information  Thank you for taking time to visit with me today. Please don't hesitate to contact me if I can be of assistance to you before our next scheduled appointment.  Your next care management appointment is by telephone on 11/17/27 at 3 pm   Please call the care guide team at [phone] if you need to cancel, schedule, or reschedule an appointment.   Please call the Suicide and Crisis Lifeline: 988 call the [place]  National Suicide Prevention Lifeline: (734) 522-0277 or TTY: 450-450-9602 TTY (316) 124-8996) to talk to a trained counselor call 1-800-273-TALK (toll free, 24 hour hotline) call the [facility]: 7800810376 call 911 if you are experiencing a Mental Health or Behavioral Health Crisis or need someone to talk to.  Bishop Vanderwerf L. Mcarthur Speedy, RN, BSN, CCM [place]  [facility], [facility] Health RN Care Manager Direct Dial: [phone]  Fax: [phone]

## 2023-12-08 NOTE — Patient Outreach (Signed)
 Requesting a return call  Patient answered  Patent reported after initial verification and discussion of her office visits since the last RN CCM out reach that she would like a return call on 12/09/23 as she is not in a place to speak with RN CCM at this time  [place] L. Mcarthur Speedy, RN, BSN, CCM [place]  [facility], [facility] Health RN Care Manager Direct Dial: [phone]  Fax: [phone]

## 2023-12-09 ENCOUNTER — Encounter: Payer: Self-pay | Admitting: *Deleted

## 2023-12-09 ENCOUNTER — Other Ambulatory Visit: Payer: Self-pay

## 2023-12-09 ENCOUNTER — Other Ambulatory Visit: Payer: Self-pay | Admitting: *Deleted

## 2023-12-09 NOTE — Patient Instructions (Signed)
 Visit Information  Thank you for taking time to visit with me today. Please don't hesitate to contact me if I can be of assistance to you before our next scheduled appointment.  Your next care management appointment is by telephone on 12/30/23 at 2:15 pm  Please call RN CCM as needed  Please call the care guide team at [phone] if you need to cancel, schedule, or reschedule an appointment.   Please call the Suicide and Crisis Lifeline: 988 call the [place]  National Suicide Prevention Lifeline: 973-366-2215 or TTY: 303-753-8609 TTY 228-827-7993) to talk to a trained counselor call 1-800-273-TALK (toll free, 24 hour hotline) go to [facility] [address] [place] [phone]) call 911 if you are experiencing a Mental Health or Behavioral Health Crisis or need someone to talk to.  Chelsae Zanella L. Mcarthur Speedy, RN, BSN, CCM [place]  [facility], [facility] Health RN Care Manager Direct Dial: [phone]  Fax: [phone]

## 2023-12-09 NOTE — Patient Outreach (Signed)
 Complex Care Management   Visit Note  12/09/2023  Name:  [name] MRN: [id number] DOB: 22-Oct-1945  Situation: Referral received for Complex Care Management related to Chronic Kidney Disease I obtained verbal consent from Patient.  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 b.)07/18/2006 -endovas oblit complex ACOM aneur. c.) Endovas Tx of 6.8x57mm RMCA aneur. d.) 3.5x18mm remnant RMCA aneur 2/2 coil compaction. e.) Interval 3.2x3.63mm saccular outpouching in ACOM c/w mild recannulization in neck. 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 g.) 3.7x34mm remnant of prev Tx'd ACOM aneur -failed embol 02/24/2012.   CKD (chronic kidney disease), stage IV (HCC)    a.) solitary functioning kidney on the RIGHT   Complication of anesthesia    a.) delayed emergence   Coronary artery disease 02/23/2007   a.) LHC 02/23/2007 --> EF 50%; 30% pLAD, 70% mRCA --> planned for staged PCI. b.) PCI 02/27/2007: EF 60%; 3.5 x 15 mm Vision BMS to 80% mRCA. b.) LHC 07/24/2007: EF 60%; minor irregs; no occlusive CAD; no intervention. c.) LHC 10/28/2010: EF 60%; 30% mLAD, LCx with minor luminal irregs, 20% ISR mRCA; no intervention. d.) LHC 05/31/2013: EF 60%; 40% mLAD, 30% ISR m-dRCA; no interventions.   DDD (degenerative disc disease), cervical    a.) s/p ACDF C5-C7; hardware in neck; patient appreciates stiffness and issues with mobility   Diastolic dysfunction    a.)  TTE 07/07/2021: EF 87.8%; normal PASP; trace TR/MR; G1DD.   Elevated LEFT hemidiaphragm    Gout    Headache(784.0)    Heart murmur    HLD (hyperlipidemia)    HOH (hard of hearing)    Has hearing aids, doesn't wear   Hyperkalemia    Hyperparathyroidism due to renal insufficiency (HCC)    Hypertension    Incomplete right bundle  branch block (RBBB)    Insomnia    a.) takes zolpidem    Long term (current) use of immunomodulator    a.) on DMARD therapy (hydroxychloroquine ) for RA/SLE Dx.   Lumbar adjacent segment disease with spondylolisthesis    Multiple acquired cysts of kidney    On chronic clopidogrel  therapy    OSA (obstructive sleep apnea)    a.) does NOT use nocturnal pap therapy   Osteoarthritis    Pancreatic cyst    Peripheral vascular disease (HCC)    Pneumonia    Post-COVID chronic cough    Pulmonary nodule    RBBB (right bundle branch block)    Rectal bleeding    Rheumatoid arthritis (HCC)    a.) on DMARD; hydroxychloriquine   Shortness of breath 03/01/2023   Systemic lupus erythematosus (HCC)    T2DM (type 2 diabetes mellitus) (HCC)    Tendinitis of left wrist    Thickened endometrium    Thickened endometrium    Wears dentures    partial lower    Assessment: Patient Reported Symptoms:  Cognitive Cognitive Status: Alert and oriented to person, place, and time, Insightful and able to interpret abstract concepts, Normal speech and language skills      Neurological Neurological Review of Symptoms: Headaches Neurological Conditions: Headache Neurological Management Strategies: Routine screening, Medication therapy, Adequate rest Neurological Self-Management Outcome: 4 (good)  HEENT HEENT Symptoms Reported: No symptoms reported HEENT Self-Management Outcome: 4 (good)  Cardiovascular Cardiovascular Symptoms Reported: Other: Other Cardiovascular Symptoms: beleives hr headache that returns on various days is related to her BP Does patient have uncontrolled Hypertension?: Yes Is patient checking Blood Pressure at home?: No Patient's Recent BP reading at home: encouraged to check more frequently to determine hypo or hypertension Cardiovascular Conditions: Coronary artery disease, Hypertension Cardiovascular Management Strategies: Adequate rest, Medication therapy Cardiovascular  Self-Management Outcome: 3 (uncertain)  Respiratory Respiratory Symptoms Reported: No symptoms reported Respiratory Conditions: Seasonal allergies Respiratory Self-Management Outcome: 4 (good)  Endocrine Patient reports the following symptoms related to hypoglycemia or hyperglycemia : Headaches Is patient diabetic?: Yes Is patient checking blood sugars at home?: No (encourged to to check cbgs) Endocrine Conditions: Diabetes Endocrine Management Strategies: Medication therapy, Adequate rest, Fluid modification, Diet modification Endocrine Self-Management Outcome: 4 (good)  Gastrointestinal Gastrointestinal Symptoms Reported: No symptoms reported Gastrointestinal Management Strategies: Diet modification Gastrointestinal Self-Management Outcome: 4 (good) Gastrointestinal Comment: she reports anticipation of a pancreatic drain Nutrition Risk Screen (CP): No indicators present  Genitourinary Genitourinary Symptoms Reported: Other Other Genitourinary Symptoms: back pain lower radiates to both hips  ? enlarge pancreatic mass,ckd Genitourinary Conditions: Chronic kidney disease Genitourinary Management Strategies: Adequate rest, Medication therapy Genitourinary Self-Management Outcome: 3 (uncertain)  Integumentary Integumentary Symptoms Reported: No symptoms reported Skin Management Strategies: Routine screening Skin Self-Management Outcome: 4 (good)  Musculoskeletal Musculoskelatal Symptoms Reviewed: Muscle pain Musculoskeletal Conditions: Back pain, Joint pain, Osteoarthritis, Rheumatoid arthritis Musculoskeletal Management Strategies: Adequate rest, Medical device, Medication therapy, Routine screening Musculoskeletal Self-Management Outcome: 3 (uncertain) Falls in the past year?: No Number of falls in past year: 1 or less Was there an injury with Fall?: No Fall Risk Category Calculator: 0 Patient Fall Risk Level: Low Fall Risk Patient at Risk for Falls Due to: History of fall(s) Fall  risk Follow up: Falls evaluation completed  Psychosocial Psychosocial Symptoms Reported: No symptoms reported Behavioral Management Strategies: Adequate rest, Support system, Coping strategies Behavioral Health Self-Management Outcome: 4 (good) Major Change/Loss/Stressor/Fears (CP): Medical condition, self Behaviors When Feeling Stressed/Fearful: this week CT/MRI abdomen indicated pancreas mass is larger Techniques to Cope with Loss/Stress/Change: Diversional activities Quality of Family Relationships: helpful, involved, supportive Do you feel physically threatened by others?: No      12/09/2023    5:05 PM  Depression screen PHQ 2/9  Decreased Interest 0  Down, Depressed, Hopeless 0  PHQ - 2 Score 0    There were no vitals filed for this visit.  Medications Reviewed Today     Reviewed by Arlyce Berger, RN (Registered Nurse) on 12/09/23 at 1657  Med List Status: <None>   Medication Order Taking? Sig Documenting Provider Last Dose Status Informant  allopurinol  (ZYLOPRIM ) 100 MG tablet [id number]  TAKE 1 TABLET BY MOUTH EVERY DAY Aisha Hove, MD  Active   clotrimazole -betamethasone (LOTRISONE) cream [id number]  APPLY TWICE A DAY TO GROIN AREA SKIN FOR 1 WEEK AND THEN AS NEEDED. Aisha Hove, MD  Active   ezetimibe  (ZETIA ) 10 MG tablet [id number]  Take 1 tablet (10 mg total) by mouth daily. Aisha Hove, MD  Active   fluticasone  (FLONASE ) 50 MCG/ACT nasal spray [id number]  Place 2 sprays into both nostrils daily. SPRAY 2 SPRAYS INTO EACH NOSTRIL EVERY DAY Aisha Hove, MD  Active   furosemide  (LASIX ) 40 MG tablet [id number]  TAKE 1 TABLET BY MOUTH EVERY DAY Aisha Hove, MD  Active   gabapentin  (NEURONTIN ) 100 MG capsule [id number]  Take 1-3 capsules (100-300 mg total) by mouth at bedtime. Lateef,  Ike Malady, MD  Expired 12/01/23 2359   glimepiride  (AMARYL ) 2 MG tablet [id number]  TAKE 1 TABLET (2 MG TOTAL) BY MOUTH SEE ADMIN INSTRUCTIONS. TAKE 1 MG DAILY, MAY INCREASE TO 2 MG IF BLOOD  SUGAR IS OVER 175 Aisha Hove, MD  Active   hydrALAZINE  (APRESOLINE ) 100 MG tablet [id number]  Take 1 tablet (100 mg total) by mouth 2 (two) times daily. Aisha Hove, MD  Active   hydroxychloroquine  (PLAQUENIL ) 200 MG tablet [id number]  Take 200 mg by mouth daily. [provider]  Active Self           Med Note Vertie Gosling, Lester Rather   Fri Aug 20, 2016  7:20 AM)    labetalol  (NORMODYNE ) 300 MG tablet [id number]  TAKE 1 TABLET BY MOUTH TWICE A DAY Aisha Hove, MD  Active   losartan  (COZAAR ) 100 MG tablet [id number]  TAKE 1 TABLET BY MOUTH EVERY DAY IN THE MORNING Aisha Hove, MD  Active   methocarbamol  (ROBAXIN ) 500 MG tablet [id number]  TAKE 1 TABLET BY MOUTH EVERY 6 HOURS AS NEEDED FOR MUSCLE SPASMS. Ludwig Safer, PA-C  Active   nitroGLYCERIN  (NITROSTAT ) 0.6 MG SL tablet [id number]  TAKE 1 TABLET BY MOUTH AS NEEDED FOR CP. WAIT 5 MINUTES BEFORE NEXT DOSE. PROCEED TO ER IF NO RELIEF AFTER 3 DOSES Aisha Hove, MD  Active   pantoprazole (PROTONIX) 40 MG tablet [id number]  TAKE 1 TABLET BY MOUTH EVERY DAY Aisha Hove, MD  Active   rosuvastatin  (CRESTOR ) 40 MG tablet [id number]  TAKE 1 TABLET BY MOUTH EVERY DAY Aisha Hove, MD  Active   senna (SENOKOT) 8.6 MG TABS tablet [id number]  Take 1 tablet (8.6 mg total) by mouth daily as needed for mild constipation. Noble Bateman, [place]  Active   SYMBICORT 80-4.5 MCG/ACT inhaler [id number]  INHALE 1 PUFF BY MOUTH TWICE A DAY Aisha Hove, MD  Active   traMADol  (ULTRAM ) 50 MG tablet [id number]  Take 1 tablet (50 mg total) by mouth every 6 (six) hours as needed for moderate pain (pain score 4-6). Jodeen Munch, MD  Active   tretinoin (RETIN-A) 0.05 % cream [id number]  APPLY TO AFFECTED AREA EVERY DAY AT BEDTIME Scoggins, Amber, NP  Active   zolpidem  (AMBIEN ) 5 MG tablet [id number]  Take 1 tablet (5 mg total) by mouth at bedtime as needed for sleep. Aisha Hove, MD  Active   Med List Note Merilyn Staple, RN 09/26/23 1448): 08-04-23  UDS MR 10/26/23            Recommendation:   PCP Follow-up Specialty provider follow-up Zelda Hickman 12/26/23 [place] surgeon 12/27/23 Continue Current Plan of Care  Follow Up Plan:   Telephone follow up appointment date/time:  12/30/23 2:15 pm   Jullie Oiler L. Mcarthur Speedy, RN, BSN, CCM [facility] RN Care Manager Direct Dial: [phone]  Fax: [phone]

## 2023-12-15 ENCOUNTER — Encounter: Payer: Self-pay | Admitting: Surgery

## 2023-12-15 ENCOUNTER — Ambulatory Visit (INDEPENDENT_AMBULATORY_CARE_PROVIDER_SITE_OTHER): Admitting: Surgery

## 2023-12-15 VITALS — BP 163/79 | HR 67 | Ht 63.0 in | Wt 172.0 lb

## 2023-12-15 DIAGNOSIS — K8689 Other specified diseases of pancreas: Secondary | ICD-10-CM

## 2023-12-15 DIAGNOSIS — D136 Benign neoplasm of pancreas: Secondary | ICD-10-CM

## 2023-12-15 DIAGNOSIS — M51369 Other intervertebral disc degeneration, lumbar region without mention of lumbar back pain or lower extremity pain: Secondary | ICD-10-CM

## 2023-12-15 DIAGNOSIS — G8928 Other chronic postprocedural pain: Secondary | ICD-10-CM

## 2023-12-15 DIAGNOSIS — K8010 Calculus of gallbladder with chronic cholecystitis without obstruction: Secondary | ICD-10-CM

## 2023-12-15 NOTE — Progress Notes (Signed)
 Patient ID: [name], female   DOB: 1945-09-04, 78 y.o.   MRN: [id number]  Chief Complaint: Gallstones, questionably symptomatic.  History of Present Illness [name] is a 78 y.o. female here to review follow-up MRI results.   She continues to complain primarily of lower back pain and has gone to great efforts to utilize a felt tip marker to mark the sites on her lower lumbosacral back area as far as where she is having her pain. Today, despite being previously intentionally avoidant of surgical intervention, she presents options to proceed with this lesion in her pancreas.  She mentioned the drainage procedure that she spoke with another friend about, and she also mentioned the Whipple procedure. I instructed her if she wished to pursue consideration for further intervention that she would be best managed by a surgical oncologist who does pancreatic surgery.  Felt improved and she be referred, to know her options, and to have her risks were fully explained. Considering her other comorbidities I am not sure this would lead to any additional intervention, however I believe she wishes to pursue this course.  She had before and previously presented with a long medical history as noted below, most recently underwent back surgery.  Has some lingering right sided back and side issues since.  She says she has a very high oil/fried food diet eating primarily [place] and [place] cuisine.  She reports no pain seemingly exacerbated by eating at all.  She denies any history of nausea postprandially, denies the presence of heartburn.  But does note that the ingestion of milk gives her significant gas.  But denies being lactose intolerant.  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 b.)07/18/2006 -endovas oblit complex ACOM aneur. c.) Endovas Tx  of 6.8x31mm RMCA aneur. d.) 3.5x49mm remnant RMCA aneur 2/2 coil compaction. e.) Interval 3.2x3.52mm saccular outpouching in ACOM c/w mild recannulization in neck. 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 g.) 3.7x61mm remnant of prev Tx'd ACOM aneur -failed embol 02/24/2012.   CKD (chronic kidney disease), stage IV (HCC)    a.) solitary functioning kidney on the RIGHT   Complication of anesthesia    a.) delayed emergence   Coronary artery disease 02/23/2007   a.) LHC 02/23/2007 --> EF 50%; 30% pLAD, 70% mRCA --> planned for staged PCI. b.) PCI 02/27/2007: EF 60%; 3.5 x 15 mm Vision BMS to 80% mRCA. b.) LHC 07/24/2007: EF 60%; minor irregs; no occlusive CAD; no intervention. c.) LHC 10/28/2010: EF 60%; 30% mLAD, LCx with minor luminal irregs, 20% ISR mRCA; no intervention. d.) LHC 05/31/2013: EF 60%; 40% mLAD, 30% ISR m-dRCA; no interventions.   DDD (degenerative disc disease), cervical    a.) s/p ACDF C5-C7; hardware in neck; patient appreciates stiffness and issues with mobility   Diastolic dysfunction    a.)  TTE 07/07/2021: EF 87.8%; normal PASP; trace TR/MR; G1DD.   Elevated LEFT hemidiaphragm    Gout    Headache(784.0)    Heart murmur    HLD (hyperlipidemia)    HOH (hard of hearing)    Has hearing aids, doesn't wear   Hyperkalemia    Hyperparathyroidism due to renal insufficiency (HCC)    Hypertension    Incomplete right bundle branch block (RBBB)    Insomnia    a.) takes zolpidem    Long  term (current) use of immunomodulator    a.) on DMARD therapy (hydroxychloroquine ) for RA/SLE Dx.   Lumbar adjacent segment disease with spondylolisthesis    Multiple acquired cysts of kidney    On chronic clopidogrel  therapy    OSA (obstructive sleep apnea)    a.) does NOT use nocturnal pap therapy   Osteoarthritis    Pancreatic cyst    Peripheral vascular disease (HCC)    Pneumonia    Post-COVID chronic cough    Pulmonary nodule    RBBB (right bundle branch block)    Rectal bleeding     Rheumatoid arthritis (HCC)    a.) on DMARD; hydroxychloriquine   Shortness of breath 03/01/2023   Systemic lupus erythematosus (HCC)    T2DM (type 2 diabetes mellitus) (HCC)    Tendinitis of left wrist    Thickened endometrium    Thickened endometrium    Wears dentures    partial lower      Past Surgical History:  Procedure Laterality Date   ANEURYSM COILING  08/2013   ANTERIOR LATERAL LUMBAR FUSION WITH PERCUTANEOUS SCREW 1 LEVEL N/A 03/09/2023   Procedure: L3-4 LATERAL LUMBAR INTERBODY FUSION;  Surgeon: Clois Fret, MD;  Location: [facility];  Service: Neurosurgery;  Laterality: N/A;   APPLICATION OF INTRAOPERATIVE CT SCAN N/A 03/09/2023   Procedure: APPLICATION OF INTRAOPERATIVE CT SCAN;  Surgeon: Clois Fret, MD;  Location: [facility];  Service: Neurosurgery;  Laterality: N/A;   Attempted embolization of previously treated ACOM aneurysm; unsuccessful N/A 01/28/2012   Location[facility]   BACK SURGERY     BARTHOLIN CYST MARSUPIALIZATION     BICEPT TENODESIS Right 03/02/2022   Procedure: BICEPS TENODESIS;  Surgeon: Edie Norleen PARAS, MD;  Location: [facility];  Service: Orthopedics;  Laterality: Right;   BREAST CYST ASPIRATION Right 01/25/2012   FNA neg.   BREAST EXCISIONAL BIOPSY Left 06/26/2007   neg   BUNIONECTOMY Bilateral 2022   CARDIAC CATHETERIZATION Left 02/23/2007   Procedure: CARDIAC CATHETERIZATION; Location: [facility]; Surgeon: Denyse Bathe, MD   CARDIAC CATHETERIZATION Left 07/31/2007   Procedure: CARDIAC CATHETERIZATION; Location: [facility]; Surgeon: Denyse Bathe, MD   CARDIAC CATHETERIZATION Left 10/28/2010   Procedure: CARDIAC CATHETERIZATION; Location: [facility]; Surgeon: Denyse Bathe, MD   CARDIAC CATHETERIZATION Left 05/31/2013   Procedure: CARDIAC CATHETERIZATION; Location: [facility]; Surgeon: Denyse Bathe, MD   Carotid arteriogram; 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 N/A 07/18/2006   Location: [facility]    CATARACT EXTRACTION     CERVICAL FUSION N/A    Procedure: ACDF C5-C7   COLONOSCOPY WITH PROPOFOL  N/A 10/04/2019   Procedure: COLONOSCOPY WITH BIOPSY;  Surgeon: Unk Corinn Skiff, MD;  Location: [facility];  Service: Endoscopy;  Laterality: N/A;  Diabetic (borderline) - oral meds priority 3   CORONARY STENT INTERVENTION Left 02/27/2007   Procedure: CORONARY STENT INTERVENTION (3.5 x 15 mm Vision BMS to mRCA); Location: [facility]; Surgeon: Deatrice Cage, MD   Endovascular near complete obliteration of enlarging neck remnant of previously treated RIGHT MCA aneurysm using stent assisted coiling N/A 09/03/2010   Location: [facility]   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 N/A 10/03/2006   Location: [facility]   HYSTEROSCOPY WITH D & C N/A 08/04/2021   Procedure: DILATATION AND CURETTAGE /HYSTEROSCOPY;  Surgeon: Victor Claudell SAUNDERS, MD;  Location: [facility];  Service: Gynecology;  Laterality: N/A;   POLYPECTOMY N/A 10/04/2019   Procedure: POLYPECTOMY;  Surgeon: Unk Corinn Skiff, MD;  Location: [facility];  Service: Endoscopy;  Laterality: N/A;   REVERSE SHOULDER ARTHROPLASTY Right 03/02/2022   Procedure: REVERSE SHOULDER ARTHROPLASTY;  Surgeon: Edie Norleen PARAS, MD;  Location: [facility];  Service: Orthopedics;  Laterality: Right;   UPPER ESOPHAGEAL ENDOSCOPIC ULTRASOUND (EUS) N/A 04/15/2016   Procedure: UPPER ESOPHAGEAL ENDOSCOPIC ULTRASOUND (EUS);  Surgeon: Asberry DELENA Coffee, MD;  Location: [facility];  Service: Gastroenterology;  Laterality: N/A;    Allergies  Allergen Reactions   Isosorbide Dinitrate Other (See Comments)    Collapse   Amlodipine Swelling   Sulfa Antibiotics Itching   Tizanidine Hcl    Oxycodone  Itching   Sulfasalazine Itching    Current Outpatient Medications  Medication Sig Dispense Refill   allopurinol  (ZYLOPRIM ) 100 MG tablet TAKE 1 TABLET BY MOUTH EVERY DAY 90 tablet 3    clotrimazole -betamethasone (LOTRISONE) cream APPLY TWICE A DAY TO GROIN AREA SKIN FOR 1 WEEK AND THEN AS NEEDED. 45 g 1   ezetimibe  (ZETIA ) 10 MG tablet Take 1 tablet (10 mg total) by mouth daily. 90 tablet 3   fluticasone  (FLONASE ) 50 MCG/ACT nasal spray Place 2 sprays into both nostrils daily. SPRAY 2 SPRAYS INTO EACH NOSTRIL EVERY DAY 48 mL 3   furosemide  (LASIX ) 40 MG tablet TAKE 1 TABLET BY MOUTH EVERY DAY 90 tablet 25   glimepiride  (AMARYL ) 2 MG tablet TAKE 1 TABLET (2 MG TOTAL) BY MOUTH SEE ADMIN INSTRUCTIONS. TAKE 1 MG DAILY, MAY INCREASE TO 2 MG IF BLOOD SUGAR IS OVER 175 180 tablet 3   hydrALAZINE  (APRESOLINE ) 100 MG tablet Take 1 tablet (100 mg total) by mouth 2 (two) times daily. 180 tablet 1   hydroxychloroquine  (PLAQUENIL ) 200 MG tablet Take 200 mg by mouth daily.     labetalol  (NORMODYNE ) 300 MG tablet TAKE 1 TABLET BY MOUTH TWICE A DAY 180 tablet 3   losartan  (COZAAR ) 100 MG tablet TAKE 1 TABLET BY MOUTH EVERY DAY IN THE MORNING 90 tablet 3   methocarbamol  (ROBAXIN ) 500 MG tablet TAKE 1 TABLET BY MOUTH EVERY 6 HOURS AS NEEDED FOR MUSCLE SPASMS. 120 tablet 0   nitroGLYCERIN  (NITROSTAT ) 0.6 MG SL tablet TAKE 1 TABLET BY MOUTH AS NEEDED FOR CP. WAIT 5 MINUTES BEFORE NEXT DOSE. PROCEED TO ER IF NO RELIEF AFTER 3 DOSES 100 tablet 1   pantoprazole (PROTONIX) 40 MG tablet TAKE 1 TABLET BY MOUTH EVERY DAY 90 tablet 3   rosuvastatin  (CRESTOR ) 40 MG tablet TAKE 1 TABLET BY MOUTH EVERY DAY 90 tablet 0   senna (SENOKOT) 8.6 MG TABS tablet Take 1 tablet (8.6 mg total) by mouth daily as needed for mild constipation. 30 tablet 0   SYMBICORT 80-4.5 MCG/ACT inhaler INHALE 1 PUFF BY MOUTH TWICE A DAY 10.2 each 2   traMADol  (ULTRAM ) 50 MG tablet Take 1 tablet (50 mg total) by mouth every 6 (six) hours as needed for moderate pain (pain score 4-6). 30 tablet 0   tretinoin (RETIN-A) 0.05 % cream APPLY TO AFFECTED AREA EVERY DAY AT BEDTIME 45 g 3   zolpidem  (AMBIEN ) 5 MG tablet Take 1 tablet (5 mg total)  by mouth at bedtime as needed for sleep. 30 tablet 0   gabapentin  (NEURONTIN ) 100 MG capsule Take 1-3 capsules (100-300 mg total) by mouth at bedtime. 135 capsule 0   No current facility-administered medications for this visit.    Family History Family History  Problem Relation Age of Onset   Breast cancer Paternal Aunt 17   Breast cancer Paternal Aunt    Heart  disease Mother    Heart disease Father    Stroke Father    Diabetes Sister    Diabetes Brother       Social History Social History   Tobacco Use   Smoking status: Never    Passive exposure: Never   Smokeless tobacco: Never  Vaping Use   Vaping status: Never Used  Substance Use Topics   Alcohol use: Yes    Comment: Occasionally at christmas   Drug use: No        Review of Systems  Constitutional: Negative.   HENT:  Positive for hearing loss.   Eyes:  Positive for blurred vision.  Respiratory:  Positive for shortness of breath.   Cardiovascular:  Positive for chest pain and leg swelling.  Gastrointestinal:  Positive for abdominal pain.  Genitourinary:  Positive for flank pain.  Skin: Negative.   Neurological:  Positive for headaches.  Psychiatric/Behavioral: Negative.       Physical Exam Blood pressure (!) 163/79, pulse 67, height 5' 3 (1.6 m), weight 172 lb (78 kg), SpO2 98%. Last Weight  Most recent update: 12/15/2023  2:22 PM    Weight  78 kg (172 lb)              CONSTITUTIONAL: Well developed, and nourished, appropriately responsive and aware without distress.   EYES: Sclera non-icteric.   EARS, NOSE, MOUTH AND THROAT:  The oropharynx is clear. Oral mucosa is pink and moist.  Hearing is intact to voice.  NECK: Trachea is midline, and there is no jugular venous distension.  LYMPH NODES:  Lymph nodes in the neck are not appreciated. RESPIRATORY:   Normal respiratory effort without pathologic use of accessory muscles. CARDIOVASCULAR:  Well perfused.  GI: The abdomen is  soft, nontender, and  nondistended. There were no palpable masses.  I did not appreciate hepatosplenomegaly.  MUSCULOSKELETAL: There are 3 well-healed parallel scars in the lumbar back region.  There is a short 3 cm transverse scar over the right lowermost ribs. SKIN: Skin turgor is normal. No pathologic skin lesions appreciated.  NEUROLOGIC:  Motor and sensation appear grossly normal.  Cranial nerves are grossly without defect. PSYCH:  Alert and oriented to person, place and time. Affect is appropriate for situation.  Data Reviewed I have personally reviewed what is currently available of the patient's imaging, recent labs and medical records.   Labs:     Latest Ref Rng & Units 10/11/2023    1:56 PM 06/21/2023    9:10 AM 03/13/2023    8:01 AM  CBC  WBC 3.4 - 10.8 x10E3/uL 5.9  5.2  9.3   Hemoglobin 11.1 - 15.9 g/dL 89.6  9.0  8.4   Hematocrit 34.0 - 46.6 % 32.0  29.1  26.4   Platelets 150 - 450 x10E3/uL 188  173  143       Latest Ref Rng & Units 12/05/2023   12:43 PM 10/11/2023    1:56 PM 08/05/2023    2:08 PM  CMP  Glucose 70 - 99 mg/dL 860  898  885   BUN 8 - 23 mg/dL 90  57  41   Creatinine 0.44 - 1.00 mg/dL 6.82  6.95  7.41   Sodium 135 - 145 mmol/L 147  148  148   Potassium 3.5 - 5.1 mmol/L 4.5  4.2  5.2   Chloride 98 - 111 mmol/L 119  112  113   CO2 22 - 32 mmol/L 20  21  22  Calcium  8.9 - 10.3 mg/dL 8.7  9.2  9.6   Total Protein 6.5 - 8.1 g/dL 6.1  5.9  5.9   Total Bilirubin 0.0 - 1.2 mg/dL 1.0  0.4  0.7   Alkaline Phos 38 - 126 U/L 50  80  81   AST 15 - 41 U/L 17  15  13    ALT 0 - 44 U/L 15  11  6      Imaging: Radiological images reviewed:  PROCEDURE: ULTRASOUND ABDOMEN COMPLETE   HISTORY: Patient is a 78 y/o Female with gallstones.Abdominal pain for five months.   COMPARISON: CT AP 03/13/2023, MRI abdomen 12/28/2022   TECHNIQUE: Two-dimensional grayscale and color Doppler ultrasound of the abdomen was performed.   FINDINGS: The pancreas demonstrates a normal homogenous  echotexture with the tail suboptimally visualized due to overlying bowel gas. There is a heterogeneous lesion with internal vascularity measuring 5.4 x 5.0 cm again identified within the head of the pancreas.   The liver demonstrates a normal echotexture without intrahepatic biliary dilatation. No masses are visualized. The main portal vein demonstrates normal hepatopedal flow.   The gallbladder partially contracted and demonstrates multiple gallstones without pericholecystic fluid or wall thickening. Echogenic foci with ring down artifact is identified along the anterior wall, suggestive of adenomyomatosis. The common bile duct measures 0.4 cm. Negative sonographic Murphy's sign.   The right kidney measures 12.2 cm in length. Renal cortical echotexture is within normal limits. There is no hydronephrosis. There are no stones. There are multiple cysts with the largest measuring 7.5 cm at the superior pole.   The left kidney is absent.   The spleen measures 9.0 cm in length and demonstrates normal echotexture.   There is no evidence of aneurysm within the visualized segments of the abdominal aorta.   The visualized segments of the IVC are unremarkable.   IMPRESSION: 1. 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 Concordant with prior imaging.   2. Partially contracted gallbladder with multiple gallstones and possible adenomyomatosis. No biliary dilatation.   3.  Multiple right renal cysts.   4.  Absent left kidney.   Thank you for allowing us  to assist in the care of this patient.     Electronically Signed   By: Lynwood Mains M.D.   On: 08/02/2023 16:06  CLINICAL DATA:  Pancreatic mass with apparent enlargement by prior  lumbar spine MR   CLINICAL DATA:  Cholecystitis, known pancreatic lesion   EXAM: MRI ABDOMEN WITHOUT AND WITH CONTRAST (INCLUDING MRCP)   TECHNIQUE: Multiplanar multisequence MR imaging of the abdomen was performed both before and after the  administration of intravenous contrast. Heavily T2-weighted images of the biliary and pancreatic ducts were obtained, and three-dimensional MRCP images were rendered by post processing.   CONTRAST:  7.5mL GADAVIST  GADOBUTROL  1 MMOL/ML IV SOLN   COMPARISON:  MR abdomen, 12/30/2022   FINDINGS: Lower chest: No acute abnormality.   Hepatobiliary: No solid liver abnormality is seen. Sludge and small gallstones in the gallbladder (series 4, image 22). No gallbladder wall thickening, or biliary dilatation.   Pancreas: Slight interval enlargement of a large, complex multiseptated cystic lesion of the central pancreatic head, measuring 5.2 x 4.5 cm, previously 4.7 x 4.3 cm (series 4, image 25). This again demonstrates multiple perceptibly enhancing internal septations but no overtly solid component. No pancreatic ductal dilatation or surrounding inflammatory changes.   Spleen: Normal in size without significant abnormality.   Adrenals/Urinary Tract: Unchanged, definitively benign macroscopic fat containing right adrenal adenoma. Normal  left adrenal. Severely atrophic, dysplastic left kidney. Simple, benign right renal cortical cysts as well as small hemorrhagic or proteinaceous cysts, requiring no further follow-up or characterization. No obvious calculi. No hydronephrosis. Kidneys are normal, without renal calculi, solid lesion, or hydronephrosis.   Stomach/Bowel: Stomach is within normal limits. No evidence of bowel wall thickening, distention, or inflammatory changes.   Vascular/Lymphatic: No significant vascular findings are present. No enlarged abdominal lymph nodes.   Other: No abdominal wall hernia or abnormality. No ascites.   Musculoskeletal: No acute or significant osseous findings.   IMPRESSION: 1. Slight interval enlargement of a large, complex multiseptated cystic lesion of the central pancreatic head, measuring 5.2 x 4.5 cm, previously 4.7 x 4.3 cm. This again  demonstrates multiple perceptibly enhancing internal septations but no overtly solid component. No pancreatic ductal dilatation or surrounding inflammatory changes. Findings remain most consistent with a serous cystadenoma. 2. Sludge and small gallstones in the gallbladder. No biliary ductal dilatation or other findings of acute cholecystitis. 3. Severely atrophic, dysplastic left kidney.     Electronically Signed   By: Marolyn JONETTA Jaksch M.D.   On: 12/08/2023 10:16  Within last 24 hrs: No results found.  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 Pancreatic mass, chronic back pain status post lower back surgery.  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   Lumbar facet arthropathy 08/04/2023   Compression of lumbar vertebra (HCC) 08/04/2023   Chronic pain syndrome 08/04/2023   Chronic hip pain, right 08/04/2023   Hypertension associated with diabetes (HCC) 05/31/2023   Acute pain of right knee 05/31/2023   Absolute anemia 05/31/2023   History of lumbar fusion 03/09/2023   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   Allergy to mold 03/01/2023   Shortness of breath 03/01/2023   Type 2 diabetes mellitus with hyperglycemia, without long-term current use of insulin  (HCC) 03/01/2023   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 01/11/2023   Breast cancer screening by mammogram 12/16/2022   Acute cough 12/02/2022   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    Seasonal allergic rhinitis due to pollen 08/26/2022   Essential hypertension, benign 08/26/2022   Mixed hyperlipidemia 08/26/2022   Rheumatoid arthritis involving multiple sites with positive rheumatoid factor (HCC) 08/26/2022   Tendinitis of left wrist 08/26/2022   Status post reverse total shoulder replacement, 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  We also discussed possible reconsultation with her neurosurgeon that did her back surgery so that she can help understand the sources of her back pain.  I do not believe this is related to her gallbladder, and likely not secondary to her pancreas either. We will make referrals at her request, for further evaluation and consultation. Questions elicited and answered  to satisfaction.   Face-to-face time spent with the patient and accompanying care providers(if present) was 30 minutes, spent counseling, educating, and coordinating care of the patient.    These notes generated with voice recognition software. I apologize for typographical errors.  Honor Leghorn M.D., FACS 12/16/2023, 12:10 PM

## 2023-12-15 NOTE — Patient Instructions (Addendum)
 We will refer you to a Pancreatic surgeon to speak with you about what may need to be done for your pancreatic cysts. They will call you to schedule this.     We suggest that you follow up with your back surgeon for them to look at your back pain.   Follow-up with our office as needed.  Please call and ask to speak with a nurse if you develop questions or concerns.

## 2023-12-22 ENCOUNTER — Other Ambulatory Visit: Payer: Self-pay | Admitting: Internal Medicine

## 2023-12-22 DIAGNOSIS — F5101 Primary insomnia: Secondary | ICD-10-CM

## 2023-12-26 DIAGNOSIS — I129 Hypertensive chronic kidney disease with stage 1 through stage 4 chronic kidney disease, or unspecified chronic kidney disease: Secondary | ICD-10-CM | POA: Diagnosis not present

## 2023-12-26 DIAGNOSIS — N2581 Secondary hyperparathyroidism of renal origin: Secondary | ICD-10-CM | POA: Diagnosis not present

## 2023-12-26 DIAGNOSIS — N184 Chronic kidney disease, stage 4 (severe): Secondary | ICD-10-CM | POA: Diagnosis not present

## 2023-12-26 DIAGNOSIS — E790 Hyperuricemia without signs of inflammatory arthritis and tophaceous disease: Secondary | ICD-10-CM | POA: Diagnosis not present

## 2023-12-26 DIAGNOSIS — D631 Anemia in chronic kidney disease: Secondary | ICD-10-CM | POA: Diagnosis not present

## 2023-12-26 LAB — PROTEIN / CREATININE RATIO, URINE: Creatinine, Urine: 120

## 2023-12-26 LAB — MICROALBUMIN / CREATININE URINE RATIO: Microalb Creat Ratio: 1819

## 2023-12-26 LAB — MICROALBUMIN, URINE: Microalb, Ur: 218.3

## 2023-12-27 ENCOUNTER — Ambulatory Visit: Admitting: Surgery

## 2023-12-28 ENCOUNTER — Ambulatory Visit: Attending: Cardiovascular Disease | Admitting: Cardiovascular Disease

## 2023-12-28 ENCOUNTER — Encounter: Payer: Self-pay | Admitting: Cardiovascular Disease

## 2023-12-28 VITALS — BP 160/100 | HR 68 | Ht 63.0 in | Wt 180.4 lb

## 2023-12-28 DIAGNOSIS — I10 Essential (primary) hypertension: Secondary | ICD-10-CM

## 2023-12-28 DIAGNOSIS — R0602 Shortness of breath: Secondary | ICD-10-CM | POA: Diagnosis not present

## 2023-12-28 DIAGNOSIS — I2510 Atherosclerotic heart disease of native coronary artery without angina pectoris: Secondary | ICD-10-CM | POA: Diagnosis not present

## 2023-12-28 DIAGNOSIS — E1159 Type 2 diabetes mellitus with other circulatory complications: Secondary | ICD-10-CM | POA: Diagnosis not present

## 2023-12-28 DIAGNOSIS — I152 Hypertension secondary to endocrine disorders: Secondary | ICD-10-CM

## 2023-12-28 DIAGNOSIS — E782 Mixed hyperlipidemia: Secondary | ICD-10-CM

## 2023-12-28 DIAGNOSIS — E1169 Type 2 diabetes mellitus with other specified complication: Secondary | ICD-10-CM

## 2023-12-28 MED ORDER — EZETIMIBE 10 MG PO TABS: 10.0000 mg | ORAL_TABLET | Freq: Every day | ORAL | 3 refills | Status: AC

## 2023-12-28 MED ORDER — ASPIRIN 81 MG PO TBEC: 81.0000 mg | DELAYED_RELEASE_TABLET | Freq: Every day | ORAL | Status: AC

## 2023-12-28 NOTE — Patient Instructions (Signed)
 Medication Instructions:  Your physician recommends the following medication changes.  STOP TAKING: Plavix   START TAKING: Aspirin  81 mg by mouth daily Zetia  10 mg by mouth daily    *If you need a refill on your cardiac medications before your next appointment, please call your pharmacy*  Lab Work: No labs ordered today    Testing/Procedures: Your physician has requested that you have an echocardiogram. Echocardiography is a painless test that uses sound waves to create images of your heart. It provides your doctor with information about the size and shape of your heart and how well your heart's chambers and valves are working.   You may receive an ultrasound enhancing agent through an IV if needed to better visualize your heart during the echo. This procedure takes approximately one hour.  There are no restrictions for this procedure.  This will take place at [address] [facility] [address], [place] [address]  Please note: We ask at that you not bring children with you during ultrasound (echo/ vascular) testing. Due to room size and safety concerns, children are not allowed in the ultrasound rooms during exams. Our front office staff cannot provide observation of children in our lobby area while testing is being conducted. An adult accompanying a patient to their appointment will only be allowed in the ultrasound room at the discretion of the ultrasound technician under special circumstances. We apologize for any inconvenience.   Follow-Up: At [facility], you and your health needs are our priority.  As part of our continuing mission to provide you with exceptional heart care, our providers are all part of one team.  This team includes your primary Cardiologist (physician) and Advanced Practice Providers or APPs (Physician Assistants and Nurse Practitioners) who all work together to provide you with the care you need, when you need it.  Your next appointment:    3 month(s)  Provider:   You will see one of the following Advanced Practice Providers on your designated Care Team:   Lonni Meager, NP Lesley Maffucci, PA-C Bernardino Bring, PA-C Cadence Laverne, PA-C Tylene Lunch, NP Barnie Hila, NP

## 2023-12-28 NOTE — Progress Notes (Signed)
 Cardiology Office Note   Date:  12/28/2023   ID:  Cathy, Leon [birth year], MRN [id number]  PCP:  Fernand Fredy RAMAN, MD  Cardiologist:   Deatrice Cage, MD   Chief Complaint  Patient presents with   New Patient (Initial Visit)    Chest pain no complaints today. Meds reviewed verbally with pt.      History of Present Illness: [name] is a 78 y.o. female who presents to establish cardiovascular care.  She was previously followed by Dr. Fernand. She has known history of coronary artery disease status post PCI and drug-eluting stent placement to the mid right coronary artery in 2008 by me.  She had 3 catheterizations after that in 2009, 2012 and 2014.  All of them showed patent stent with mild restenosis.  LAD had mild nonobstructive disease.  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  She had chest pain in May but no recurrent symptoms since then.  She is limited by shortness of breath.  She also reports lower extremity edema.  She follows closely with nephrology given advanced chronic kidney disease.  She had back surgery last year.  She reports bilateral leg pain and discomfort with walking.  She is concerned about peripheral arterial disease.  Past Medical History:  Diagnosis Date   Anemia of chronic renal disease    Arthritis    Cerebral aneurysm 06/07/2006   a.) 6.2x4.89mm and 4.9x4.44mm ACOM & 7x4.57mm RMCA aneur. b.)07/18/2006 -endovas oblit complex ACOM aneur. c.) Endovas Tx of 6.8x31mm RMCA aneur. d.) 3.5x42mm remnant RMCA aneur 2/2 coil compaction. e.) Interval 3.2x3.55mm saccular outpouching in ACOM c/w mild recannulization in neck. 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 g.) 3.7x61mm remnant of prev Tx'd ACOM aneur -failed embol 02/24/2012.   CKD (chronic kidney disease),  stage IV (HCC)    a.) solitary functioning kidney on the RIGHT   Complication of anesthesia    a.) delayed emergence   Coronary artery disease 02/23/2007   a.) LHC 02/23/2007 --> EF 50%; 30% pLAD, 70% mRCA --> planned for staged PCI. b.) PCI 02/27/2007: EF 60%; 3.5 x 15 mm Vision BMS to 80% mRCA. b.) LHC 07/24/2007: EF 60%; minor irregs; no occlusive CAD; no intervention. c.) LHC 10/28/2010: EF 60%; 30% mLAD, LCx with minor luminal irregs, 20% ISR mRCA; no intervention. d.) LHC 05/31/2013: EF 60%; 40% mLAD, 30% ISR m-dRCA; no interventions.   DDD (degenerative disc disease), cervical    a.) s/p ACDF C5-C7; hardware in neck; patient appreciates stiffness and issues with mobility   Diastolic dysfunction    a.)  TTE 07/07/2021: EF 87.8%; normal PASP; trace TR/MR; G1DD.   Elevated LEFT hemidiaphragm    Gout    Headache(784.0)    Heart murmur    HLD (hyperlipidemia)    HOH (hard of hearing)    Has hearing aids, doesn't wear   Hyperkalemia    Hyperparathyroidism due to renal insufficiency (HCC)    Hypertension    Incomplete right bundle branch block (RBBB)    Insomnia    a.) takes zolpidem    Long term (current) use of immunomodulator    a.) on DMARD therapy (hydroxychloroquine ) for RA/SLE Dx.   Lumbar adjacent segment disease with spondylolisthesis    Multiple acquired cysts of kidney    On chronic clopidogrel  therapy    OSA (obstructive  sleep apnea)    a.) does NOT use nocturnal pap therapy   Osteoarthritis    Pancreatic cyst    Peripheral vascular disease (HCC)    Pneumonia    Post-COVID chronic cough    Pulmonary nodule    RBBB (right bundle branch block)    Rectal bleeding    Rheumatoid arthritis (HCC)    a.) on DMARD; hydroxychloriquine   Shortness of breath 03/01/2023   Systemic lupus erythematosus (HCC)    T2DM (type 2 diabetes mellitus) (HCC)    Tendinitis of left wrist    Thickened endometrium    Thickened endometrium    Wears dentures    partial lower    Past  Surgical History:  Procedure Laterality Date   ANEURYSM COILING  08/2013   ANTERIOR LATERAL LUMBAR FUSION WITH PERCUTANEOUS SCREW 1 LEVEL N/A 03/09/2023   Procedure: L3-4 LATERAL LUMBAR INTERBODY FUSION;  Surgeon: Clois Fret, MD;  Location: [facility];  Service: Neurosurgery;  Laterality: N/A;   APPLICATION OF INTRAOPERATIVE CT SCAN N/A 03/09/2023   Procedure: APPLICATION OF INTRAOPERATIVE CT SCAN;  Surgeon: Clois Fret, MD;  Location: [facility];  Service: Neurosurgery;  Laterality: N/A;   Attempted embolization of previously treated ACOM aneurysm; unsuccessful N/A 01/28/2012   Location: [facility]   BACK SURGERY     BARTHOLIN CYST MARSUPIALIZATION     BICEPT TENODESIS Right 03/02/2022   Procedure: BICEPS TENODESIS;  Surgeon: Edie Norleen PARAS, MD;  Location: [facility];  Service: Orthopedics;  Laterality: Right;   BREAST CYST ASPIRATION Right 01/25/2012   FNA neg.   BREAST EXCISIONAL BIOPSY Left 06/26/2007   neg   BUNIONECTOMY Bilateral 2022   CARDIAC CATHETERIZATION Left 02/23/2007   Procedure: CARDIAC CATHETERIZATION; Location: [facility]; Surgeon: Denyse Bathe, MD   CARDIAC CATHETERIZATION Left 07/31/2007   Procedure: CARDIAC CATHETERIZATION; Location: [facility]; Surgeon: Denyse Bathe, MD   CARDIAC CATHETERIZATION Left 10/28/2010   Procedure: CARDIAC CATHETERIZATION; Location: [facility]; Surgeon: Denyse Bathe, MD   CARDIAC CATHETERIZATION Left 05/31/2013   Procedure: CARDIAC CATHETERIZATION; Location: [facility]; Surgeon: Denyse Bathe, MD   Carotid arteriogram; 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 N/A 07/18/2006   Location: [facility]   CATARACT EXTRACTION     CERVICAL FUSION N/A    Procedure: ACDF C5-C7   COLONOSCOPY WITH PROPOFOL  N/A 10/04/2019   Procedure: COLONOSCOPY WITH BIOPSY;  Surgeon: Unk Corinn Skiff, MD;  Location: [facility];  Service: Endoscopy;  Laterality: N/A;  Diabetic (borderline) - oral meds priority 3    CORONARY STENT INTERVENTION Left 02/27/2007   Procedure: CORONARY STENT INTERVENTION (3.5 x 15 mm Vision BMS to mRCA); Location: [facility]; Surgeon: Deatrice Cage, MD   Endovascular near complete obliteration of enlarging neck remnant of previously treated RIGHT MCA aneurysm using stent assisted coiling N/A 09/03/2010   Location: [facility]   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 N/A 10/03/2006   Location: [facility]   HYSTEROSCOPY WITH D & C N/A 08/04/2021   Procedure: DILATATION AND CURETTAGE /HYSTEROSCOPY;  Surgeon: Victor Claudell SAUNDERS, MD;  Location: [facility];  Service: Gynecology;  Laterality: N/A;   POLYPECTOMY N/A 10/04/2019   Procedure: POLYPECTOMY;  Surgeon: Unk Corinn Skiff, MD;  Location: [facility];  Service: Endoscopy;  Laterality: N/A;   REVERSE SHOULDER ARTHROPLASTY Right 03/02/2022   Procedure: REVERSE SHOULDER ARTHROPLASTY;  Surgeon: Edie Norleen PARAS, MD;  Location: [facility];  Service: Orthopedics;  Laterality: Right;   UPPER ESOPHAGEAL ENDOSCOPIC ULTRASOUND (EUS) N/A 04/15/2016  Procedure: UPPER ESOPHAGEAL ENDOSCOPIC ULTRASOUND (EUS);  Surgeon: Asberry DELENA Coffee, MD;  Location: [facility];  Service: Gastroenterology;  Laterality: N/A;     Current Outpatient Medications  Medication Sig Dispense Refill   allopurinol  (ZYLOPRIM ) 100 MG tablet TAKE 1 TABLET BY MOUTH EVERY DAY 90 tablet 3   aspirin  EC 81 MG tablet Take 1 tablet (81 mg total) by mouth daily. Swallow whole.     clotrimazole -betamethasone (LOTRISONE) cream APPLY TWICE A DAY TO GROIN AREA SKIN FOR 1 WEEK AND THEN AS NEEDED. 45 g 1   fluticasone  (FLONASE ) 50 MCG/ACT nasal spray Place 2 sprays into both nostrils daily. SPRAY 2 SPRAYS INTO EACH NOSTRIL EVERY DAY 48 mL 3   furosemide  (LASIX ) 40 MG tablet TAKE 1 TABLET BY MOUTH EVERY DAY 90 tablet 25   gabapentin  (NEURONTIN ) 100 MG capsule Take 1-3 capsules (100-300 mg total) by mouth at bedtime. 135 capsule 0    glimepiride  (AMARYL ) 2 MG tablet TAKE 1 TABLET (2 MG TOTAL) BY MOUTH SEE ADMIN INSTRUCTIONS. TAKE 1 MG DAILY, MAY INCREASE TO 2 MG IF BLOOD SUGAR IS OVER 175 180 tablet 3   hydrALAZINE  (APRESOLINE ) 100 MG tablet Take 1 tablet (100 mg total) by mouth 2 (two) times daily. 180 tablet 1   hydroxychloroquine  (PLAQUENIL ) 200 MG tablet Take 200 mg by mouth daily.     labetalol  (NORMODYNE ) 300 MG tablet TAKE 1 TABLET BY MOUTH TWICE A DAY 180 tablet 3   losartan  (COZAAR ) 100 MG tablet TAKE 1 TABLET BY MOUTH EVERY DAY IN THE MORNING 90 tablet 3   methocarbamol  (ROBAXIN ) 500 MG tablet TAKE 1 TABLET BY MOUTH EVERY 6 HOURS AS NEEDED FOR MUSCLE SPASMS. 120 tablet 0   nitroGLYCERIN  (NITROSTAT ) 0.6 MG SL tablet TAKE 1 TABLET BY MOUTH AS NEEDED FOR CP. WAIT 5 MINUTES BEFORE NEXT DOSE. PROCEED TO ER IF NO RELIEF AFTER 3 DOSES 100 tablet 1   pantoprazole (PROTONIX) 40 MG tablet TAKE 1 TABLET BY MOUTH EVERY DAY 90 tablet 3   rosuvastatin  (CRESTOR ) 40 MG tablet TAKE 1 TABLET BY MOUTH EVERY DAY 90 tablet 0   senna (SENOKOT) 8.6 MG TABS tablet Take 1 tablet (8.6 mg total) by mouth daily as needed for mild constipation. 30 tablet 0   SYMBICORT 80-4.5 MCG/ACT inhaler INHALE 1 PUFF BY MOUTH TWICE A DAY 10.2 each 2   traMADol  (ULTRAM ) 50 MG tablet Take 1 tablet (50 mg total) by mouth every 6 (six) hours as needed for moderate pain (pain score 4-6). 30 tablet 0   tretinoin (RETIN-A) 0.05 % cream APPLY TO AFFECTED AREA EVERY DAY AT BEDTIME 45 g 3   zolpidem  (AMBIEN ) 5 MG tablet TAKE 1 TABLET BY MOUTH AT BEDTIME AS NEEDED FOR SLEEP. 30 tablet 0   ezetimibe  (ZETIA ) 10 MG tablet Take 1 tablet (10 mg total) by mouth daily. 90 tablet 3   No current facility-administered medications for this visit.    Allergies:   Isosorbide dinitrate, Amlodipine, Sulfa antibiotics, Tizanidine hcl, Oxycodone , and Sulfasalazine    Social History:  The patient  reports that she has never smoked. She has never been exposed to tobacco smoke. She  has never used smokeless tobacco. She reports current alcohol use. She reports that she does not use drugs.   Family History:  The patient's family history includes Breast cancer in her paternal aunt; Breast cancer (age of onset: 26) in her paternal aunt; Diabetes in her brother and sister; Heart disease in her father and mother; Stroke in  her father.    ROS:  Please see the history of present illness.   Otherwise, review of systems are positive for none.   All other systems are reviewed and negative.    PHYSICAL EXAM: VS:  BP (!) 160/100 (BP Location: Right Arm, Patient Position: Sitting, Cuff Size: Large)   Pulse 68   Ht 5' 3 (1.6 m)   Wt 180 lb 6 oz (81.8 kg)   SpO2 97%   BMI 31.95 kg/m  , BMI Body mass index is 31.95 kg/m. GEN: Well nourished, well developed, in no acute distress  HEENT: normal  Neck: no JVD, carotid bruits, or masses Cardiac: RRR; no murmurs, rubs, or gallops,no edema  Respiratory:  clear to auscultation bilaterally, normal work of breathing GI: soft, nontender, nondistended, + BS MS: no deformity or atrophy  Skin: warm and dry, no rash Neuro:  Strength and sensation are intact Psych: euthymic mood, full affect Vascular: Femoral pulses +2 bilaterally.  Dorsalis pedis is palpable bilaterally.  EKG:  EKG is ordered today. The ekg ordered today demonstrates : Normal sinus rhythm Nonspecific T wave abnormality When compared with ECG of 21-Jan-2023 09:53, QRS duration has decreased Nonspecific T wave abnormality now evident in Lateral leads    Recent Labs: 10/11/2023: Hemoglobin 10.3; Platelets 188 12/05/2023: ALT 15; BUN 90; Creatinine, Ser 3.17; Potassium 4.5; Sodium 147    Lipid Panel    Component Value Date/Time   CHOL 279 (H) 10/11/2023 1356   CHOL 193 05/31/2013 0044   TRIG 291 (H) 10/11/2023 1356   TRIG 105 05/31/2013 0044   HDL 38 (L) 10/11/2023 1356   HDL 39 (L) 05/31/2013 0044   CHOLHDL 5.8 (H) 12/16/2022 1501   VLDL 21 05/31/2013 0044    LDLCALC 184 (H) 10/11/2023 1356   LDLCALC 133 (H) 05/31/2013 0044      Wt Readings from Last 3 Encounters:  12/28/23 180 lb 6 oz (81.8 kg)  12/15/23 172 lb (78 kg)  12/01/23 172 lb (78 kg)          12/28/2023    9:06 AM  PAD Screen  Previous PAD dx? No  Previous surgical procedure? No  Pain with walking? Yes  Subsides with rest? No  Feet/toe relief with dangling? No  Painful, non-healing ulcers? No  Extremities discolored? No      ASSESSMENT AND PLAN:  1.  Coronary artery disease involving native coronary arteries: She had chest pain in May but none since then.  Will hold off on ischemic cardiac evaluation.  She is limited by exertional dyspnea and lower extremity edema likely due to diastolic heart failure.  I requested an echocardiogram for evaluation.  2.  Hyperlipidemia: She has severe hyperlipidemia with most recent LDL of 184.  She reports that she was not taking rosuvastatin  at that time and since then she resumed.  She has not been taking ezetimibe .  I elected to resume this today.  She will require follow-up labs in the next few months.  3.  Chronic kidney disease: Most recent creatinine was 3.6 with a GFR of 12.  Followed by nephrology.  Avoid nephrotoxic medications.  4.  Essential hypertension: Blood pressure is elevated today but was normal when she saw Dr. Dennise yesterday.  She is on multiple antihypertensive medications.  I made no changes today.  5.  Exertional leg pain: Her femoral pulses are normal and dorsalis pedis is palpable bilaterally.  Doubt significant peripheral arterial disease.  Her symptoms are likely related to neuropathic pain.  Disposition:   FU 3 months.  Signed,  Deatrice Cage, MD  12/28/2023 1:56 PM     [facility]

## 2023-12-30 ENCOUNTER — Telehealth: Payer: Self-pay | Admitting: *Deleted

## 2023-12-30 ENCOUNTER — Encounter: Payer: Self-pay | Admitting: *Deleted

## 2024-01-12 DIAGNOSIS — N2581 Secondary hyperparathyroidism of renal origin: Secondary | ICD-10-CM | POA: Diagnosis not present

## 2024-01-12 DIAGNOSIS — E790 Hyperuricemia without signs of inflammatory arthritis and tophaceous disease: Secondary | ICD-10-CM | POA: Diagnosis not present

## 2024-01-12 DIAGNOSIS — N189 Chronic kidney disease, unspecified: Secondary | ICD-10-CM | POA: Diagnosis not present

## 2024-01-12 DIAGNOSIS — I10 Essential (primary) hypertension: Secondary | ICD-10-CM | POA: Diagnosis not present

## 2024-01-12 DIAGNOSIS — I129 Hypertensive chronic kidney disease with stage 1 through stage 4 chronic kidney disease, or unspecified chronic kidney disease: Secondary | ICD-10-CM | POA: Diagnosis not present

## 2024-01-16 ENCOUNTER — Other Ambulatory Visit: Payer: Self-pay | Admitting: *Deleted

## 2024-01-16 NOTE — Patient Instructions (Signed)
 Visit Information  Thank you for taking time to visit with me today. Please don't hesitate to contact me if I can be of assistance to you before our next scheduled appointment.  Your next care management appointment is by telephone on 02/01/24 at 3 pm  Please review the education information at the end of this note  Please call the care guide team at [phone] if you need to cancel, schedule, or reschedule an appointment.   Please call the Suicide and Crisis Lifeline: 988 call the [place]  National Suicide Prevention Lifeline: (928) 881-3091 or TTY: 951-053-0283 TTY 909-451-4206) to talk to a trained counselor call 1-800-273-TALK (toll free, 24 hour hotline) call the [facility]: ([phone])[phone] call 911 if you are experiencing a Mental Health or Behavioral Health Crisis or need someone to talk to.  Jakyla Reza L. Ramonita, RN, BSN, CCM [place]  [facility], [facility] RN Care Manager Direct Dial: [phone]  Fax: [phone]

## 2024-01-16 NOTE — Patient Outreach (Signed)
 Complex Care Management   Visit Note  02/27/2024 update note for 01/16/24  Name:  [name] MRN: [id number] DOB: [birth year]  Situation: Referral received for Complex Care Management related to Chronic Kidney Disease I obtained verbal consent from Patient.  Visit completed with [name]  on the phone  She voiced understanding of the Compression hose discussion, nutrition information, comparison of neuropathic pain and bone pain  She voiced the appreciation of the discussions and will follow through related to compression hose and improving her food take  Background:   Past Medical History:  Diagnosis Date   Anemia of chronic renal disease    Arthritis    Cerebral aneurysm 06/07/2006   a.) 6.2x4.68mm and 4.9x4.48mm ACOM & 7x4.47mm RMCA aneur. b.)07/18/2006 -endovas oblit complex ACOM aneur. c.) Endovas Tx of 6.8x39mm RMCA aneur. d.) 3.5x30mm remnant RMCA aneur 2/2 coil compaction. e.) Interval 3.2x3.39mm saccular outpouching in ACOM c/w mild recannulization in neck. 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 g.) 3.7x60mm remnant of prev Tx'd ACOM aneur -failed embol 02/24/2012.   CKD (chronic kidney disease), stage IV (HCC)    a.) solitary functioning kidney on the RIGHT   Complication of anesthesia    a.) delayed emergence   Coronary artery disease 02/23/2007   a.) LHC 02/23/2007 --> EF 50%; 30% pLAD, 70% mRCA --> planned for staged PCI. b.) PCI 02/27/2007: EF 60%; 3.5 x 15 mm Vision BMS to 80% mRCA. b.) LHC 07/24/2007: EF 60%; minor irregs; no occlusive CAD; no intervention. c.) LHC 10/28/2010: EF 60%; 30% mLAD, LCx with minor luminal irregs, 20% ISR mRCA; no intervention. d.) LHC 05/31/2013: EF 60%; 40% mLAD, 30% ISR m-dRCA; no interventions.   DDD (degenerative disc disease), cervical    a.) s/p ACDF C5-C7; hardware in neck; patient appreciates stiffness and issues with mobility   Diastolic dysfunction    a.)  TTE 07/07/2021: EF 87.8%; normal PASP; trace TR/MR; G1DD.    Elevated LEFT hemidiaphragm    Gout    Headache(784.0)    Heart murmur    HLD (hyperlipidemia)    HOH (hard of hearing)    Has hearing aids, doesn't wear   Hyperkalemia    Hyperparathyroidism due to renal insufficiency (HCC)    Hypertension    Incomplete right bundle branch block (RBBB)    Insomnia    a.) takes zolpidem    Long term (current) use of immunomodulator    a.) on DMARD therapy (hydroxychloroquine ) for RA/SLE Dx.   Lumbar adjacent segment disease with spondylolisthesis    Multiple acquired cysts of kidney    On chronic clopidogrel  therapy    OSA (obstructive sleep apnea)    a.) does NOT use nocturnal pap therapy   Osteoarthritis    Pancreatic cyst    Peripheral vascular disease (HCC)    Pneumonia    Post-COVID chronic cough    Pulmonary nodule    RBBB (right bundle branch block)    Rectal bleeding    Rheumatoid arthritis (HCC)    a.) on DMARD; hydroxychloriquine   Shortness of breath 03/01/2023   Systemic lupus erythematosus (HCC)    T2DM (type 2 diabetes mellitus) (HCC)    Tendinitis of left wrist    Thickened endometrium    Thickened endometrium    Wears dentures    partial lower    Assessment: Patient Reported Symptoms:  Cognitive Cognitive Status: No symptoms reported Cognitive/Intellectual Conditions Management [RPT]: None reported or documented in medical history or problem list   Health  Facilitated by: Pain control, Rest  Neurological Neurological Review of Symptoms: No symptoms reported Neurological Self-Management Outcome: 4 (good)  HEENT HEENT Symptoms Reported: No symptoms reported HEENT Self-Management Outcome: 4 (good)    Cardiovascular Cardiovascular Symptoms Reported: No symptoms reported Cardiovascular Self-Management Outcome: 4 (good)  Respiratory Respiratory Symptoms Reported: No symptoms reported Respiratory Self-Management Outcome: 4 (good)  Endocrine Endocrine Symptoms Reported: No symptoms reported Endocrine Self-Management  Outcome: 4 (good)  Gastrointestinal Gastrointestinal Symptoms Reported: No symptoms reported Gastrointestinal Self-Management Outcome: 4 (good)    Genitourinary Genitourinary Symptoms Reported: No symptoms reported Genitourinary Self-Management Outcome: 3 (uncertain) Genitourinary Comment: Dr Dennise spoke  Integumentary Integumentary Symptoms Reported: No symptoms reported Skin Self-Management Outcome: 4 (good)  Musculoskeletal Musculoskelatal Symptoms Reviewed: Back pain, Limited mobility, Difficulty walking, Joint pain Musculoskeletal Management Strategies: Routine screening, Medication therapy, Medical device, Adequate rest, Coping strategies Musculoskeletal Self-Management Outcome: 3 (uncertain)      Psychosocial Psychosocial Symptoms Reported: No symptoms reported Behavioral Health Self-Management Outcome: 4 (good)          02/01/2024    6:37 PM  Depression screen PHQ 2/9  Decreased Interest 0  Down, Depressed, Hopeless 0  PHQ - 2 Score 0    There were no vitals filed for this visit.  Medications Reviewed Today   Medications were not reviewed in this encounter     Recommendation:   PCP Follow-up Specialty provider follow-up nephrologist Continue Current Plan of Care  Follow Up Plan:   Telephone follow up appointment date/time:  02/01/24 3 pm  Ritaj Dullea L. Ramonita, RN, BSN, CCM [place], [place]  [facility], [facility] RN Care Manager Direct Dial: [phone]  Fax: [phone]

## 2024-01-17 ENCOUNTER — Ambulatory Visit: Admitting: Internal Medicine

## 2024-01-17 ENCOUNTER — Encounter: Payer: Self-pay | Admitting: Cardiology

## 2024-01-17 ENCOUNTER — Ambulatory Visit (INDEPENDENT_AMBULATORY_CARE_PROVIDER_SITE_OTHER): Admitting: Cardiology

## 2024-01-17 VITALS — BP 142/89 | HR 69 | Ht 63.0 in | Wt 183.6 lb

## 2024-01-17 DIAGNOSIS — I152 Hypertension secondary to endocrine disorders: Secondary | ICD-10-CM

## 2024-01-17 DIAGNOSIS — R0602 Shortness of breath: Secondary | ICD-10-CM | POA: Diagnosis not present

## 2024-01-17 DIAGNOSIS — E119 Type 2 diabetes mellitus without complications: Secondary | ICD-10-CM | POA: Diagnosis not present

## 2024-01-17 DIAGNOSIS — Z91048 Other nonmedicinal substance allergy status: Secondary | ICD-10-CM | POA: Diagnosis not present

## 2024-01-17 MED ORDER — LEVOCETIRIZINE DIHYDROCHLORIDE 5 MG PO TABS: 5.0000 mg | ORAL_TABLET | Freq: Every evening | ORAL | 1 refills | Status: DC

## 2024-01-17 MED ORDER — ALBUTEROL SULFATE HFA 108 (90 BASE) MCG/ACT IN AERS
2.0000 | INHALATION_SPRAY | Freq: Four times a day (QID) | RESPIRATORY_TRACT | 2 refills | Status: AC | PRN
Start: 2024-01-17 — End: ?

## 2024-01-17 NOTE — Progress Notes (Signed)
 Established Patient Office Visit  Subjective:  Patient ID: [name], female    DOB: 1945/12/20  Age: 78 y.o. MRN: [id number]  Chief Complaint  Patient presents with   Acute Visit    SOB, states possible black mold issue- NK patient. SOB started a week ago. Other symptoms have been occurring for 2 months.     Patient in office for an acute visit. Patient complaining of shortness of breath for the past week. Thinks she may have a black mold exposure. Patient seen 02/2023 with same complaints. Patient chest xray at that time was normal.   Patient did not make changes to her living arrangements, states she is still being exposed to mold. Will check blood work today. Not taking an antihistamine, will send in Xyzal . Albuterol  inhaler send in for shortness of breath and cough.  Recommend making changes to living situation to prevent further mold exposure.     No other concerns at this time.   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 b.)07/18/2006 -endovas oblit complex ACOM aneur. c.) Endovas Tx of 6.8x71mm RMCA aneur. d.) 3.5x47mm remnant RMCA aneur 2/2 coil compaction. e.) Interval 3.2x3.89mm saccular outpouching in ACOM c/w mild recannulization in neck. 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 g.) 3.7x2mm remnant of prev Tx'd ACOM aneur -failed embol 02/24/2012.   CKD (chronic kidney disease), stage IV (HCC)    a.) solitary functioning kidney on the RIGHT   Complication of anesthesia    a.) delayed emergence   Coronary artery disease 02/23/2007   a.) LHC 02/23/2007 --> EF 50%; 30% pLAD, 70% mRCA --> planned for staged PCI. b.) PCI 02/27/2007: EF 60%; 3.5 x 15 mm Vision BMS to 80% mRCA. b.) LHC 07/24/2007: EF 60%; minor irregs; no occlusive CAD; no intervention. c.) LHC 10/28/2010: EF 60%; 30% mLAD, LCx with minor luminal irregs, 20% ISR mRCA;  no intervention. d.) LHC 05/31/2013: EF 60%; 40% mLAD, 30% ISR m-dRCA; no interventions.   DDD (degenerative disc disease), cervical    a.) s/p ACDF C5-C7; hardware in neck; patient appreciates stiffness and issues with mobility   Diastolic dysfunction    a.)  TTE 07/07/2021: EF 87.8%; normal PASP; trace TR/MR; G1DD.   Elevated LEFT hemidiaphragm    Gout    Headache(784.0)    Heart murmur    HLD (hyperlipidemia)    HOH (hard of hearing)    Has hearing aids, doesn't wear   Hyperkalemia    Hyperparathyroidism due to renal insufficiency (HCC)    Hypertension    Incomplete right bundle branch block (RBBB)    Insomnia    a.) takes zolpidem    Long term (current) use of immunomodulator    a.) on DMARD therapy (hydroxychloroquine ) for RA/SLE Dx.   Lumbar adjacent segment disease with spondylolisthesis    Multiple acquired cysts of kidney    On chronic clopidogrel  therapy    OSA (obstructive sleep apnea)    a.) does NOT use nocturnal pap therapy   Osteoarthritis    Pancreatic cyst    Peripheral vascular disease (HCC)    Pneumonia    Post-COVID chronic cough    Pulmonary nodule    RBBB (right bundle branch block)    Rectal bleeding    Rheumatoid arthritis (HCC)    a.) on DMARD; hydroxychloriquine   Shortness of breath 03/01/2023  Systemic lupus erythematosus (HCC)    T2DM (type 2 diabetes mellitus) (HCC)    Tendinitis of left wrist    Thickened endometrium    Thickened endometrium    Wears dentures    partial lower    Past Surgical History:  Procedure Laterality Date   ANEURYSM COILING  08/2013   ANTERIOR LATERAL LUMBAR FUSION WITH PERCUTANEOUS SCREW 1 LEVEL N/A 03/09/2023   Procedure: L3-4 LATERAL LUMBAR INTERBODY FUSION;  Surgeon: Clois Fret, MD;  Location: [facility];  Service: Neurosurgery;  Laterality: N/A;   APPLICATION OF INTRAOPERATIVE CT SCAN N/A 03/09/2023   Procedure: APPLICATION OF INTRAOPERATIVE CT SCAN;  Surgeon: Clois Fret, MD;  Location: [facility];   Service: Neurosurgery;  Laterality: N/A;   Attempted embolization of previously treated ACOM aneurysm; unsuccessful N/A 01/28/2012   Location: [facility]   BACK SURGERY     BARTHOLIN CYST MARSUPIALIZATION     BICEPT TENODESIS Right 03/02/2022   Procedure: BICEPS TENODESIS;  Surgeon: Edie Norleen PARAS, MD;  Location: [facility];  Service: Orthopedics;  Laterality: Right;   BREAST CYST ASPIRATION Right 01/25/2012   FNA neg.   BREAST EXCISIONAL BIOPSY Left 06/26/2007   neg   BUNIONECTOMY Bilateral 2022   CARDIAC CATHETERIZATION Left 02/23/2007   Procedure: CARDIAC CATHETERIZATION; Location: [facility]; Surgeon: Denyse Bathe, MD   CARDIAC CATHETERIZATION Left 07/31/2007   Procedure: CARDIAC CATHETERIZATION; Location: [facility]; Surgeon: Denyse Bathe, MD   CARDIAC CATHETERIZATION Left 10/28/2010   Procedure: CARDIAC CATHETERIZATION; Location: [facility]; Surgeon: Denyse Bathe, MD   CARDIAC CATHETERIZATION Left 05/31/2013   Procedure: CARDIAC CATHETERIZATION; Location: [facility]; Surgeon: Denyse Bathe, MD   Carotid arteriogram; 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 N/A 07/18/2006   Location: [facility]   CATARACT EXTRACTION     CERVICAL FUSION N/A    Procedure: ACDF C5-C7   COLONOSCOPY WITH PROPOFOL  N/A 10/04/2019   Procedure: COLONOSCOPY WITH BIOPSY;  Surgeon: Unk Corinn Skiff, MD;  Location: [facility];  Service: Endoscopy;  Laterality: N/A;  Diabetic (borderline) - oral meds priority 3   CORONARY STENT INTERVENTION Left 02/27/2007   Procedure: CORONARY STENT INTERVENTION (3.5 x 15 mm Vision BMS to mRCA); Location: [facility]; Surgeon: Deatrice Cage, MD   Endovascular near complete obliteration of enlarging neck remnant of previously treated RIGHT MCA aneurysm using stent assisted coiling N/A 09/03/2010   Location: [facility]   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 N/A 10/03/2006   Location: [facility]    HYSTEROSCOPY WITH D & C N/A 08/04/2021   Procedure: DILATATION AND CURETTAGE /HYSTEROSCOPY;  Surgeon: Victor Claudell SAUNDERS, MD;  Location: [facility];  Service: Gynecology;  Laterality: N/A;   POLYPECTOMY N/A 10/04/2019   Procedure: POLYPECTOMY;  Surgeon: Unk Corinn Skiff, MD;  Location: [facility];  Service: Endoscopy;  Laterality: N/A;   REVERSE SHOULDER ARTHROPLASTY Right 03/02/2022   Procedure: REVERSE SHOULDER ARTHROPLASTY;  Surgeon: Edie Norleen PARAS, MD;  Location: [facility];  Service: Orthopedics;  Laterality: Right;   UPPER ESOPHAGEAL ENDOSCOPIC ULTRASOUND (EUS) N/A 04/15/2016   Procedure: UPPER ESOPHAGEAL ENDOSCOPIC ULTRASOUND (EUS);  Surgeon: Asberry DELENA Coffee, MD;  Location: [facility];  Service: Gastroenterology;  Laterality: N/A;    Social History   Socioeconomic History   Marital status: Widowed    Spouse name: Not on file   Number of children: Not on file   Years of education: Not on file   Highest education level: Not on file  Occupational History   Not on  file  Tobacco Use   Smoking status: Never    Passive exposure: Never   Smokeless tobacco: Never  Vaping Use   Vaping status: Never Used  Substance and Sexual Activity   Alcohol use: Yes    Comment: Occasionally at christmas   Drug use: No   Sexual activity: Not Currently    Birth control/protection: Post-menopausal  Other Topics Concern   Not on file  Social History Narrative   Lives with daughter   Social Drivers of Health   Financial Resource Strain: Low Risk  (10/11/2023)   Overall Financial Resource Strain (CARDIA)    Difficulty of Paying Living Expenses: Not hard at all  Food Insecurity: No Food Insecurity (10/11/2023)   Hunger Vital Sign    Worried About Running Out of Food in the Last Year: Never true    Ran Out of Food in the Last Year: Never true  Transportation Needs: No Transportation Needs (10/11/2023)   PRAPARE - Administrator, Civil Service (Medical): No    Lack  of Transportation (Non-Medical): No  Physical Activity: Not on file  Stress: No Stress Concern Present (10/11/2023)   [employer] of Occupational Health - Occupational Stress Questionnaire    Feeling of Stress : Not at all  Social Connections: Moderately Isolated (10/11/2023)   Social Connection and Isolation Panel    Frequency of Communication with Friends and Family: More than three times a week    Frequency of Social Gatherings with Friends and Family: More than three times a week    Attends Religious Services: Never    Database administrator or Organizations: Yes    Attends Banker Meetings: Never    Marital Status: Widowed  Intimate Partner Violence: Not At Risk (10/11/2023)   Humiliation, Afraid, Rape, and Kick questionnaire    Fear of Current or Ex-Partner: No    Emotionally Abused: No    Physically Abused: No    Sexually Abused: No    Family History  Problem Relation Age of Onset   Breast cancer Paternal Aunt 53   Breast cancer Paternal Aunt    Heart disease Mother    Heart disease Father    Stroke Father    Diabetes Sister    Diabetes Brother     Allergies  Allergen Reactions   Isosorbide Dinitrate Other (See Comments)    Collapse   Amlodipine Swelling   Sulfa Antibiotics Itching   Tizanidine Hcl    Oxycodone  Itching   Sulfasalazine Itching    Outpatient Medications Prior to Visit  Medication Sig   allopurinol  (ZYLOPRIM ) 100 MG tablet TAKE 1 TABLET BY MOUTH EVERY DAY   aspirin  EC 81 MG tablet Take 1 tablet (81 mg total) by mouth daily. Swallow whole.   clotrimazole -betamethasone (LOTRISONE) cream APPLY TWICE A DAY TO GROIN AREA SKIN FOR 1 WEEK AND THEN AS NEEDED.   ezetimibe  (ZETIA ) 10 MG tablet Take 1 tablet (10 mg total) by mouth daily.   fluticasone  (FLONASE ) 50 MCG/ACT nasal spray Place 2 sprays into both nostrils daily. SPRAY 2 SPRAYS INTO EACH NOSTRIL EVERY DAY   furosemide  (LASIX ) 40 MG tablet TAKE 1 TABLET BY MOUTH EVERY DAY    gabapentin  (NEURONTIN ) 100 MG capsule Take 1-3 capsules (100-300 mg total) by mouth at bedtime.   glimepiride  (AMARYL ) 2 MG tablet TAKE 1 TABLET (2 MG TOTAL) BY MOUTH SEE ADMIN INSTRUCTIONS. TAKE 1 MG DAILY, MAY INCREASE TO 2 MG IF BLOOD SUGAR IS OVER 175  hydrALAZINE  (APRESOLINE ) 100 MG tablet Take 1 tablet (100 mg total) by mouth 2 (two) times daily.   hydroxychloroquine  (PLAQUENIL ) 200 MG tablet Take 200 mg by mouth daily.   labetalol  (NORMODYNE ) 300 MG tablet TAKE 1 TABLET BY MOUTH TWICE A DAY   losartan  (COZAAR ) 100 MG tablet TAKE 1 TABLET BY MOUTH EVERY DAY IN THE MORNING   methocarbamol  (ROBAXIN ) 500 MG tablet TAKE 1 TABLET BY MOUTH EVERY 6 HOURS AS NEEDED FOR MUSCLE SPASMS.   nitroGLYCERIN  (NITROSTAT ) 0.6 MG SL tablet TAKE 1 TABLET BY MOUTH AS NEEDED FOR CP. WAIT 5 MINUTES BEFORE NEXT DOSE. PROCEED TO ER IF NO RELIEF AFTER 3 DOSES   pantoprazole (PROTONIX) 40 MG tablet TAKE 1 TABLET BY MOUTH EVERY DAY   rosuvastatin  (CRESTOR ) 40 MG tablet TAKE 1 TABLET BY MOUTH EVERY DAY   senna (SENOKOT) 8.6 MG TABS tablet Take 1 tablet (8.6 mg total) by mouth daily as needed for mild constipation.   SYMBICORT 80-4.5 MCG/ACT inhaler INHALE 1 PUFF BY MOUTH TWICE A DAY   traMADol  (ULTRAM ) 50 MG tablet Take 1 tablet (50 mg total) by mouth every 6 (six) hours as needed for moderate pain (pain score 4-6).   tretinoin (RETIN-A) 0.05 % cream APPLY TO AFFECTED AREA EVERY DAY AT BEDTIME   zolpidem  (AMBIEN ) 5 MG tablet TAKE 1 TABLET BY MOUTH AT BEDTIME AS NEEDED FOR SLEEP.   No facility-administered medications prior to visit.    Review of Systems  Constitutional: Negative.   HENT: Negative.  Negative for congestion and sinus pain.   Eyes:  Positive for discharge.  Respiratory:  Positive for cough and shortness of breath. Negative for sputum production.   Cardiovascular:  Positive for leg swelling. Negative for chest pain.  Gastrointestinal: Negative.  Negative for abdominal pain, constipation and diarrhea.   Genitourinary: Negative.   Musculoskeletal:  Negative for joint pain and myalgias.  Skin: Negative.   Neurological: Negative.  Negative for dizziness and headaches.  Endo/Heme/Allergies: Negative.   All other systems reviewed and are negative.      Objective:   BP (!) 142/89   Pulse 69   Ht 5' 3 (1.6 m)   Wt 183 lb 9.6 oz (83.3 kg)   SpO2 94%   BMI 32.52 kg/m   Vitals:   01/17/24 1358  BP: (!) 142/89  Pulse: 69  Height: 5' 3 (1.6 m)  Weight: 183 lb 9.6 oz (83.3 kg)  SpO2: 94%  BMI (Calculated): 32.53    Physical Exam Vitals and nursing note reviewed.  Constitutional:      Appearance: Normal appearance. She is normal weight.  HENT:     Head: Normocephalic and atraumatic.     Nose: Nose normal.     Mouth/Throat:     Mouth: Mucous membranes are moist.  Eyes:     Extraocular Movements: Extraocular movements intact.     Conjunctiva/sclera: Conjunctivae normal.     Pupils: Pupils are equal, round, and reactive to light.  Cardiovascular:     Rate and Rhythm: Normal rate and regular rhythm.     Pulses: Normal pulses.     Heart sounds: Normal heart sounds.  Pulmonary:     Effort: Pulmonary effort is normal.     Breath sounds: Normal breath sounds.  Abdominal:     General: Abdomen is flat. Bowel sounds are normal.     Palpations: Abdomen is soft.  Musculoskeletal:        General: Normal range of motion.  Cervical back: Normal range of motion.  Skin:    General: Skin is warm and dry.  Neurological:     General: No focal deficit present.     Mental Status: She is alert and oriented to person, place, and time.  Psychiatric:        Mood and Affect: Mood normal.        Behavior: Behavior normal.        Thought Content: Thought content normal.        Judgment: Judgment normal.      No results found for any visits on 01/17/24.  Recent Results (from the past 2160 hours)  POCT CBG (Fasting - Glucose)     Status: Abnormal   Collection Time: 11/17/23 11:23  AM  Result Value Ref Range   Glucose Fasting, POC 116 (A) 70 - 99 mg/dL  POCT CBG (Fasting - Glucose)     Status: Abnormal   Collection Time: 11/24/23  2:41 PM  Result Value Ref Range   Glucose Fasting, POC 231 (A) 70 - 99 mg/dL  Comprehensive metabolic panel with GFR     Status: Abnormal   Collection Time: 12/05/23 12:43 PM  Result Value Ref Range   Sodium 147 (H) 135 - 145 mmol/L   Potassium 4.5 3.5 - 5.1 mmol/L   Chloride 119 (H) 98 - 111 mmol/L   CO2 20 (L) 22 - 32 mmol/L   Glucose, Bld 139 (H) 70 - 99 mg/dL    Comment: Glucose reference range applies only to samples taken after fasting for at least 8 hours.   BUN 90 (H) 8 - 23 mg/dL   Creatinine, Ser 6.82 (H) 0.44 - 1.00 mg/dL   Calcium  8.7 (L) 8.9 - 10.3 mg/dL   Total Protein 6.1 (L) 6.5 - 8.1 g/dL   Albumin  3.6 3.5 - 5.0 g/dL   AST 17 15 - 41 U/L   ALT 15 0 - 44 U/L   Alkaline Phosphatase 50 38 - 126 U/L   Total Bilirubin 1.0 0.0 - 1.2 mg/dL   GFR, Estimated 15 (L) >60 mL/min    Comment: (NOTE) Calculated using the CKD-EPI Creatinine Equation (2021)    Anion gap 8 5 - 15    Comment: Performed at [facility], [address] [place], [place] [address]      Assessment & Plan:  Blood work today Xyzaal Albuterol  inhaler  Problem List Items Addressed This Visit       Other   Allergy to mold   Relevant Orders   Allergen Profile, Mold   Shortness of breath - Primary   Relevant Orders   Allergen Profile, Mold    Return if symptoms worsen or fail to improve, for keep Sept. appt with NK.   Total time spent: 25 minutes  [employer], NP  01/17/2024   This document may have been prepared by Dragon Voice Recognition software and as such may include unintentional dictation errors.

## 2024-01-19 ENCOUNTER — Ambulatory Visit: Payer: Self-pay | Admitting: Cardiology

## 2024-01-19 LAB — ALLERGEN PROFILE, MOLD
Alternaria Alternata IgE: <0.1 kU/L
Aspergillus Fumigatus IgE: <0.1 kU/L
Aureobasidi Pullulans IgE: <0.1 kU/L
Candida Albicans IgE: <0.1 kU/L
Cladosporium Herbarum IgE: <0.1 kU/L
M009-IgE Fusarium proliferatum: <0.1 kU/L
M014-IgE Epicoccum purpur: <0.1 kU/L
Mucor Racemosus IgE: <0.1 kU/L
Penicillium Chrysogen IgE: <0.1 kU/L
Phoma Betae IgE: <0.1 kU/L
Setomelanomma Rostrat: <0.1 kU/L
Stemphylium Herbarum IgE: <0.1 kU/L

## 2024-01-20 NOTE — Progress Notes (Signed)
Pt informed

## 2024-01-23 DIAGNOSIS — E790 Hyperuricemia without signs of inflammatory arthritis and tophaceous disease: Secondary | ICD-10-CM | POA: Diagnosis not present

## 2024-01-23 DIAGNOSIS — R809 Proteinuria, unspecified: Secondary | ICD-10-CM | POA: Diagnosis not present

## 2024-01-23 DIAGNOSIS — D631 Anemia in chronic kidney disease: Secondary | ICD-10-CM | POA: Diagnosis not present

## 2024-01-23 DIAGNOSIS — N184 Chronic kidney disease, stage 4 (severe): Secondary | ICD-10-CM | POA: Diagnosis not present

## 2024-01-23 DIAGNOSIS — I129 Hypertensive chronic kidney disease with stage 1 through stage 4 chronic kidney disease, or unspecified chronic kidney disease: Secondary | ICD-10-CM | POA: Diagnosis not present

## 2024-01-23 DIAGNOSIS — N2581 Secondary hyperparathyroidism of renal origin: Secondary | ICD-10-CM | POA: Diagnosis not present

## 2024-01-23 NOTE — Progress Notes (Signed)
 Follow Up Visit   Patient Name: [name], female   Patient DOB: 10-08-1945 Date of Service: 01/23/2024  Patient MRN: [id number] Provider Creating Note: Saralee Stank, MD  [phone] Primary Care Physician:   [address] [place] [address] Additional Physicians/ Providers:    History of Present Illness [name] is a 78 y.o. female who originally from [place]. 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  ==========================================  Patient presents for follow-up of advanced CKD.  She has history of solitary functioning right kidney.  Left kidney is atrophic. 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  He was treated with prednisone .  ANA negative in 2022. Today she presents by herself.  States that she has changed her diet and trying to eat more healthy.  Most recent lab results from 01/12/2024 show a creatinine of 3.0/GFR 15 which is a slight improvement from creatinine of 3.61/GFR 12.  We had also asked her to hold furosemide .  She has now developed about 1+ pitting edema bilaterally over her feet and ankles.  Blood pressure today is elevated at 155/84 standing and 154/68 sitting.    Medications   Current Outpatient Medications:  .  acetaminophen  (TYLENOL  8 HOUR) 650 MG 8 hr tablet, Take 650 mg by mouth if needed for mild pain Do not crush, chew, or split., Disp: , Rfl:  .  allopurinol  (ZYLOPRIM ) 100 MG tablet, Take 1 tablet by mouth 1 (one) time each day, Disp: , Rfl:  .  budesonide-formoterol  (SYMBICORT) 80-4.5 MCG/ACT inhaler, Inhale 1 puff 2 (two) times a day Rinse mouth with water  after use to reduce aftertaste and  incidence of candidiasis. Do not swallow., Disp: , Rfl:  .  calcitriol (Rocaltrol) 0.25 MCG capsule, Take 1 capsule (0.25 mcg total) by mouth 1 (one) time each day, Disp: 30 capsule, Rfl: 11 .  fluticasone  (FLONASE ) 50 MCG/ACT nasal spray, Administer 1 spray into each nostril 1 (one) time each day As needed, Disp: , Rfl:  .  gabapentin  (NEURONTIN ) 100 MG capsule, Take 1 capsule by mouth at night if needed, Disp: , Rfl:  .  glimepiride  (AMARYL ) 2 MG tablet, 1 (one) time each day, Disp: , Rfl:  .  hydrALAZINE  100 MG tablet, Take 1 tablet (100 mg total) by mouth in the morning and 1 tablet (100 mg total) in the evening., Disp: , Rfl:  .  hydroxychloroquine  (PLAQUENIL ) 200 MG tablet, 1 (one) time each day, Disp: , Rfl:  .  labetalol  (NORMODYNE ) 200 MG tablet, TAKE TABLET BY MOUTH TWICE A DAY, Disp: , Rfl:  .  losartan  (COZAAR ) 100 MG tablet, Take 100 mg by mouth 1 (one) time each day, Disp: , Rfl:  .  methocarbamol  (ROBAXIN ) 500 MG tablet, Take 500 mg by mouth 1 (one) time each day if needed for muscle spasms, Disp: , Rfl:  .  pantoprazole (PROTONIX) 40 MG EC tablet, Take 40 mg by mouth if needed, Disp: , Rfl:  .  rosuvastatin  (CRESTOR ) 40 MG tablet, Take 40 mg by mouth 1 (one) time each day, Disp: , Rfl:  .  traMADol  (ULTRAM ) 50 MG tablet, Take 50 mg by mouth if needed, Disp: , Rfl:  .  tretinoin (RETIN-A) 0.025 %  cream, Apply topically 1 (one) time each day if needed, Disp: , Rfl:  .  zolpidem  (AMBIEN ) 5 MG tablet, Take 5 mg by mouth at bed time, Disp: , Rfl:    Allergies Amlodipine besylate [amlodipine], Isosorbide dinitrate [isosorbide nitrate], and Sulfa antibiotics   Physical Exam  Vitals BP 155/84 (BP Location: Left upper arm, Patient Position: Standing)   Pulse 67   Temp 98.3 F   Wt 183 lb (83 kg)   SpO2 92%   BMI 32.42 kg/m   PHYSICAL EXAM: General appearance: well developed, well nourished, NAD Lungs: CTAB, with normal respiratory effort  CV: S1S2, no murmurs or  rubs. Abdomen: Soft, non-tender; bowel sounds present Extremities: 1+ bilateral peripheral edema Ambulates with a cane.    Chemistry      Component Value Date/Time   NA 146 01/12/2024 1010   K 4.7 01/12/2024 1010   CL 119 (H) 01/12/2024 1010   CO2 21 01/12/2024 1010   BUN 51 (H) 01/12/2024 1010   CREATININE 3.04 (H) 01/12/2024 1010      Component Value Date/Time   CALCIUM  8.8 01/12/2024 1010   AST 19 12/16/2020 1405   ALT 11 12/16/2020 1405     Lab Results  Component Value Date   WBC 5.3 12/26/2023   RBC 2.95 (L) 12/26/2023   HGB 8.8 (L) 12/26/2023   HCT 28.2 (L) 12/26/2023   MCV 95.6 12/26/2023   PLT 147 12/26/2023      Urine  Lab Units 12/26/23 1227 11/23/23 1051 02/23/22 1227  COLOR U  YELLOW YELLOW YELLOW  KETONES U MG/DL  TRACE* NEGATIVE NEGATIVE  PROT/CREAT RATIO UR mg/g creat  --   --  0.439*  439*  ALB MG/G CREAT UR mg/g creat 1,819* 2,246*  --       Imaging and Other Studies CLINICAL DATA:  Pancreatic mass with apparent enlargement by prior lumbar spine MR  EXAM: 12/28/2022 MRI ABDOMEN WITHOUT AND WITH CONTRAST  TECHNIQUE: Multiplanar multisequence MR imaging of the abdomen was performed both before and after the administration of intravenous contrast.  CONTRAST:  8 mL Vueway  gadolinium contrast IV  COMPARISON:  MR lumbar spine, 11/25/2022, MR abdomen, 09/22/2019  FINDINGS: Lower chest: No acute abnormality.  Hepatobiliary: No solid liver abnormality is seen. Sludge contracted in the gallbladder. No gallstones, gallbladder wall thickening, or biliary dilatation.  Pancreas: Large, complex, multiseptated cystic lesion of the central pancreatic head with hemorrhagic or proteinaceous contents and numerous contrast enhancing internal septation, measuring 4.7 x 4.3 cm (series 9, image 22). This is significantly enlarged in comparison to prior examination dated 09/22/2019, at which time it measured 2.4 x 2.2 cm. No pancreatic ductal dilatation  or surrounding inflammatory changes.  Spleen: Normal in size without significant abnormality.  Adrenals/Urinary Tract: Unchanged, definitively benign macroscopic fat containing right adrenal adenoma, for which no further follow-up or characterization is required. Normal left adrenal gland. Severely atrophic, dysplastic left kidney. Multiple simple, benign right renal cortical cysts, as well as intrinsically T1 hyperintense, nonenhancing hemorrhagic or proteinaceous cysts.  Stomach/Bowel: Stomach is within normal limits. No evidence of bowel wall thickening, distention, or inflammatory changes.  Vascular/Lymphatic: No significant vascular findings are present. No enlarged abdominal lymph nodes.  Other: No abdominal wall hernia or abnormality. No ascites.  Musculoskeletal: No acute or significant osseous findings.  IMPRESSION: 1. Large, complex, multiseptated cystic lesion of the central pancreatic head with hemorrhagic or proteinaceous contents and numerous contrast enhancing internal septations, measuring 4.7 x 4.3 cm. 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 2. Severely atrophic, dysplastic left kidney. 3. Sludge in the gallbladder.   Electronically Signed   By: Marolyn JONETTA Jaksch M.D.   On: 12/30/2022 22:28   Lab review: 01/21/2023-creatinine 2.50, GFR 19 02/02/2023-creatinine 2.90, GFR 16 02/07/2023-creatinine 2.50, GFR 19.  At baseline. 10/11/23-creatinine 3.0/GFR 15 11/23/2023-creatinine 3.1/GFR 15.  This is stable compared to results from about a month ago from 10/11/2023 12/05/23-creatinine 3.17/GFR 15, BUN 90 Impression/Recommendations   [name] is a 78 y.o. female with a past medical history of SLE, hypertension, coronary artery disease s/p coronary stents and angina, diabetes, pulmonary nodule, chronic kidney disease stage IV with anemia and proteinuria now  comes for renal follow-up.  1. Chronic kidney disease stage 4 (HCC)   2. Hypertensive chronic kidney disease, unspecified, with chronic kidney disease stage I through stage IV, or unspecified   3. Anemia in chronic kidney disease   4. Hyperparathyroidism due to renal insufficiency (HCC)   5. Hyperuricemia   6. Proteinuria, not otherwise specified     Hypertensive CKD stage IV with proteinuria  CKD is most likely due to atrophic left kidney and chronic hypertensive nephrosclerosis complicated by diabetes.  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 She has solitary functioning right kidney  Most recent labs-01/12/2024-creatinine 3.0/GFR 15.  Slight improvement noted after holding furosemide . Continued on clopidogrel , losartan , rosuvastatin  for cardiovascular risk reduction. GFR too low for SGLT2 inhibitor We will continue to monitor.  Patient has attended kidney disease education in the past.   We discussed various renal replacement options as well as nondialysis management.  She is very hesitant about the thought of going on dialysis. In the past we have discussed options including peritoneal dialysis as well as in center hemodialysis. She would do okay with PD but I think she is leaning more towards in center hemodialysis. We will continue to monitor.  Obtain labs today and discuss more at next visit.  Patient declined referral to vascular for AV fistula evaluation.  Hyperkalemia Likely due to drinking coconut water  and orange juice in the past. Most recent potassium level from July 2025 is normal.  Anemia of chronic kidney disease,  Hemoglobin 8.8 from 12/26/2023. Continue to monitor  Secondary hyperparathyroidism:  Lab Results  Component Value Date   PTH 319 (H) 12/26/2023   CALCIUM  8.8 01/12/2024   PHOS 4.3 01/12/2024  Start calcitriol.  Hyperlipidemia LDL 184 in April 2025. Patient was not taking her  cholesterol medications.  Now she is on Zetia  and has restarted rosuvastatin .   Hypertension:  Blood pressure is elevated today and she has developed increased edema.  This may be volume driven as we had discontinued furosemide . Recommended to restart furosemide  40 mg at low-dose twice a week.   Diabetes type II with complications:  Advised on the importance of strict blood sugar control.   Hemoglobin A1c 6.1% from April 2025. Currently not on insulin .  Managed with glimepiride  and diet control.  Chronic Hip Pain Followed at chronic pain clinic with Dr Wallie Sherry H/o back surgery by Dr Judson.  Discectomy at L3/4, Hedron implant with allograft at L3/4  Hyperuricemia Continue allopurinol  once daily.    Return in about 2 months (around 03/24/2024).     Saralee Stank, MD [facility] Ph: [phone] Fax: [phone] 01/23/2024   Orders Placed This Encounter  . CBC and Differential  .  PTH, Intact  . Renal Function Panel  . Uric Acid  . Urinalysis with microscopic  . Urine Albumin  / Creatinine Ratio  . Ferritin  . Iron Panel (Fe, TIBC, TSAT)  . Magnesium   . ANA W/Reflex  . C3 Complement  . C4 Complement  . calcitriol (Rocaltrol) 0.25 MCG capsule

## 2024-01-24 ENCOUNTER — Other Ambulatory Visit: Payer: Self-pay | Admitting: Internal Medicine

## 2024-01-24 DIAGNOSIS — F5101 Primary insomnia: Secondary | ICD-10-CM

## 2024-01-30 ENCOUNTER — Ambulatory Visit: Attending: Cardiovascular Disease

## 2024-01-30 DIAGNOSIS — R0602 Shortness of breath: Secondary | ICD-10-CM

## 2024-01-30 LAB — ECHOCARDIOGRAM COMPLETE
AR max vel: 2.13 cm2
AV Area VTI: 2.06 cm2
AV Area mean vel: 1.99 cm2
AV Mean grad: 6 mmHg
AV Peak grad: 10.8 mmHg
Ao pk vel: 1.64 m/s
Area-P 1/2: 2.6 cm2
S' Lateral: 4.51 cm

## 2024-01-31 ENCOUNTER — Ambulatory Visit: Payer: Self-pay | Admitting: Cardiovascular Disease

## 2024-02-01 ENCOUNTER — Other Ambulatory Visit: Payer: Self-pay

## 2024-02-01 ENCOUNTER — Other Ambulatory Visit: Payer: Self-pay | Admitting: *Deleted

## 2024-02-01 ENCOUNTER — Ambulatory Visit: Admitting: Dietician

## 2024-02-01 NOTE — Patient Instructions (Addendum)
 Visit Information  Thank you for taking time to visit with me today. Please don't hesitate to contact me if I can be of assistance to you before our next scheduled appointment.  Your next care management appointment is by telephone on 02/27/24 at 3 pm    Please call the care guide team at [phone] if you need to cancel, schedule, or reschedule an appointment.   Please call the Suicide and Crisis Lifeline: 988 call the [place]  National Suicide Prevention Lifeline: 7085960273 or TTY: 437-267-2868 TTY (906)368-6440) to talk to a trained counselor call 1-800-273-TALK (toll free, 24 hour hotline) go to [facility] [address], [place] [phone]) call 911 if you are experiencing a Mental Health or Behavioral Health Crisis or need someone to talk to.  Carren Blakley L. Ramonita, RN, BSN, CCM [place]  [facility], [facility] RN Care Manager Direct Dial: [phone]  Fax: [phone]

## 2024-02-01 NOTE — Patient Outreach (Signed)
 Complex Care Management   Visit Note  02/01/2024  Name:  [name] MRN: [id number] DOB: 1946-01-30  Situation: Referral received for Complex Care Management related to Chronic Kidney Disease I obtained verbal consent from Patient.  Visit completed with [name]  on the phone   Reports she still remains in pain above the hips, around the back  Issue with walking long distances  01/23/24 imaging showed Large, complex, multiseptated cystic lesion of the central pancreatic head with hemorrhagic or proteinaceous contents and numerous contrast enhancing internal septations, measuring 4.7 x 4.3 cm. 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 2. Severely atrophic, dysplastic left kidney. 3. Sludge in the gallbladder.    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 b.)07/18/2006 -endovas oblit complex ACOM aneur. c.) Endovas Tx of 6.8x18mm RMCA aneur. d.) 3.5x30mm remnant RMCA aneur 2/2 coil compaction. e.) Interval 3.2x3.17mm saccular outpouching in ACOM c/w mild recannulization in neck. 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 g.) 3.7x42mm remnant of prev Tx'd ACOM aneur -failed embol 02/24/2012.   CKD (chronic kidney disease), stage IV (HCC)    a.) solitary functioning kidney on the RIGHT   Complication of anesthesia    a.) delayed emergence   Coronary artery disease 02/23/2007   a.) LHC 02/23/2007 --> EF 50%; 30% pLAD, 70% mRCA --> planned for staged  PCI. b.) PCI 02/27/2007: EF 60%; 3.5 x 15 mm Vision BMS to 80% mRCA. b.) LHC 07/24/2007: EF 60%; minor irregs; no occlusive CAD; no intervention. c.) LHC 10/28/2010: EF 60%; 30% mLAD, LCx with minor luminal irregs, 20% ISR mRCA; no intervention. d.) LHC 05/31/2013: EF 60%; 40% mLAD, 30% ISR m-dRCA; no interventions.   DDD (degenerative disc disease), cervical    a.) s/p ACDF C5-C7; hardware in neck; patient appreciates stiffness and issues with mobility   Diastolic dysfunction    a.)  TTE 07/07/2021: EF 87.8%; normal PASP; trace TR/MR; G1DD.   Elevated LEFT hemidiaphragm    Gout    Headache(784.0)    Heart murmur    HLD (hyperlipidemia)    HOH (hard of hearing)    Has hearing aids, doesn't wear   Hyperkalemia    Hyperparathyroidism due to renal insufficiency (HCC)    Hypertension    Incomplete right bundle branch block (RBBB)    Insomnia    a.) takes zolpidem    Long term (current) use of immunomodulator    a.) on DMARD therapy (hydroxychloroquine ) for RA/SLE Dx.   Lumbar adjacent segment disease with spondylolisthesis    Multiple acquired cysts of kidney    On chronic clopidogrel  therapy    OSA (obstructive sleep apnea)    a.) does NOT use nocturnal pap therapy   Osteoarthritis    Pancreatic cyst    Peripheral vascular disease (HCC)    Pneumonia    Post-COVID chronic cough    Pulmonary nodule    RBBB (right bundle branch block)    Rectal  bleeding    Rheumatoid arthritis (HCC)    a.) on DMARD; hydroxychloriquine   Shortness of breath 03/01/2023   Systemic lupus erythematosus (HCC)    T2DM (type 2 diabetes mellitus) (HCC)    Tendinitis of left wrist    Thickened endometrium    Thickened endometrium    Wears dentures    partial lower    Assessment: Patient Reported Symptoms:  Cognitive Cognitive Status: No symptoms reported      Neurological Neurological Review of Symptoms: Headaches Neurological Self-Management Outcome: 4 (good)  HEENT HEENT Symptoms Reported: No  symptoms reported HEENT Self-Management Outcome: 4 (good)    Cardiovascular Cardiovascular Symptoms Reported: Other: Other Cardiovascular Symptoms: headaches at intervals Does patient have uncontrolled Hypertension?: Yes Is patient checking Blood Pressure at home?: No Cardiovascular Self-Management Outcome: 4 (good)  Respiratory Respiratory Symptoms Reported: No symptoms reported Respiratory Self-Management Outcome: 4 (good)  Endocrine Endocrine Symptoms Reported: No symptoms reported Is patient diabetic?: Yes Is patient checking blood sugars at home?: No Endocrine Self-Management Outcome: 4 (good)  Gastrointestinal Gastrointestinal Symptoms Reported: No symptoms reported Gastrointestinal Self-Management Outcome: 4 (good)    Genitourinary Genitourinary Symptoms Reported: No symptoms reported Genitourinary Self-Management Outcome: 4 (good)  Integumentary Integumentary Symptoms Reported: No symptoms reported Skin Self-Management Outcome: 4 (good)  Musculoskeletal Musculoskelatal Symptoms Reviewed: Other, Back pain, Limited mobility, Difficulty walking, Joint pain Other Musculoskeletal Symptoms: more pain with more movement Additional Musculoskeletal Details: legs froze up when walking into [employer] recently - she stopped until she was able to get her legs functioning again, She reports she has rheumatoid and osteoarthritis + lupus Musculoskeletal Management Strategies: Activity, Adequate rest, Medication therapy, Routine screening, Coping strategies Musculoskeletal Self-Management Outcome: 3 (uncertain) Falls in the past year?: No Number of falls in past year: 1 or less Was there an injury with Fall?: No Fall Risk Category Calculator: 0 Patient Fall Risk Level: Low Fall Risk Patient at Risk for Falls Due to: Impaired balance/gait, Impaired mobility, Orthopedic patient Fall risk Follow up: Falls evaluation completed  Psychosocial Psychosocial Symptoms Reported: No symptoms  reported Behavioral Health Self-Management Outcome: 4 (good)   Quality of Family Relationships: helpful, involved, supportive Do you feel physically threatened by others?: No      02/01/2024    6:37 PM  Depression screen PHQ 2/9  Decreased Interest 0  Down, Depressed, Hopeless 0  PHQ - 2 Score 0    There were no vitals filed for this visit.  Medications Reviewed Today   Medications were not reviewed in this encounter     Recommendation:   PCP Follow-up Continue Current Plan of Care Steriod injections inquiry by patient for pain  Follow Up Plan:   Telephone follow up appointment date/time:  02/26/24 3 pm  Cathrine Krizan L. Ramonita, RN, BSN, CCM [place]  [facility], [facility] RN Care Manager Direct Dial: [phone]  Fax: [phone]

## 2024-02-03 ENCOUNTER — Encounter: Payer: Self-pay | Admitting: *Deleted

## 2024-02-03 NOTE — Patient Outreach (Addendum)
 Follow up on request for injection for pain  Response from pcp staff about pt request for pain medicine injection for hips back pain Referred [name] [name] to her ortho/pain management MDs [name] CM inquired about the name of the pain management provider  [name] [name] forward in basket message with request to Dr Kayla Cleotilde Iha L. Ramonita, [name], [name], [name] [place]  [facility], [facility] [name] Care Manager Direct Dial: [phone]  Fax: [phone]

## 2024-02-07 ENCOUNTER — Encounter: Payer: Self-pay | Admitting: Internal Medicine

## 2024-02-07 ENCOUNTER — Ambulatory Visit (INDEPENDENT_AMBULATORY_CARE_PROVIDER_SITE_OTHER): Admitting: Internal Medicine

## 2024-02-07 VITALS — BP 138/82 | HR 70 | Ht 63.0 in | Wt 178.6 lb

## 2024-02-07 DIAGNOSIS — F411 Generalized anxiety disorder: Secondary | ICD-10-CM

## 2024-02-07 DIAGNOSIS — M0579 Rheumatoid arthritis with rheumatoid factor of multiple sites without organ or systems involvement: Secondary | ICD-10-CM

## 2024-02-07 DIAGNOSIS — E1169 Type 2 diabetes mellitus with other specified complication: Secondary | ICD-10-CM

## 2024-02-07 DIAGNOSIS — E1165 Type 2 diabetes mellitus with hyperglycemia: Secondary | ICD-10-CM

## 2024-02-07 DIAGNOSIS — N184 Chronic kidney disease, stage 4 (severe): Secondary | ICD-10-CM

## 2024-02-07 DIAGNOSIS — I152 Hypertension secondary to endocrine disorders: Secondary | ICD-10-CM

## 2024-02-07 DIAGNOSIS — E1159 Type 2 diabetes mellitus with other circulatory complications: Secondary | ICD-10-CM

## 2024-02-07 DIAGNOSIS — E782 Mixed hyperlipidemia: Secondary | ICD-10-CM

## 2024-02-07 MED ORDER — CITALOPRAM HYDROBROMIDE 10 MG PO TABS: 10.0000 mg | ORAL_TABLET | Freq: Every day | ORAL | 2 refills | Status: DC

## 2024-02-07 NOTE — Progress Notes (Deleted)
 Second opinion for CKD, pancreas specialist no contact was referral sent? Pain clinic FU only went 1 visit

## 2024-02-07 NOTE — Progress Notes (Signed)
 Established Patient Office Visit  Subjective:  Patient ID: [name], female    DOB: [birth year]  Age: 78 y.o. MRN: [id number]  Chief Complaint  Patient presents with   Back Pain    Patient complains of her chronic back pain ,getting flare up recently, needs to contact her Pain clinic. 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, by her general surgeon, waiting for appointment. Her BUN/creatinine are high- renal are discussing HD, but she wants a second opinion-  Also reports of excessive tearing from right eye- needs eye exam- Denies chest pain, no shortness of breath, no palpitations. Reports of getting anxious and depressed from all this- agrees to start small dose Celexa  10 mg/d.    No other concerns at this time.   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 b.)07/18/2006 -endovas oblit complex ACOM aneur. c.) Endovas Tx of 6.8x80mm RMCA aneur. d.) 3.5x59mm remnant RMCA aneur 2/2 coil compaction. e.) Interval 3.2x3.58mm saccular outpouching in ACOM c/w mild recannulization in neck. 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 g.) 3.7x45mm remnant of prev Tx'd ACOM aneur -failed embol 02/24/2012.   CKD (chronic kidney disease), stage IV (HCC)    a.) solitary functioning kidney on the RIGHT   Complication of anesthesia    a.) delayed emergence   Coronary artery disease 02/23/2007   a.) LHC 02/23/2007 --> EF 50%; 30% pLAD, 70% mRCA --> planned for staged PCI. b.) PCI 02/27/2007: EF 60%; 3.5 x 15 mm Vision BMS to 80% mRCA. b.) LHC 07/24/2007: EF 60%; minor irregs; no occlusive CAD; no intervention. c.) LHC 10/28/2010: EF 60%; 30% mLAD, LCx with minor luminal irregs, 20% ISR mRCA; no intervention. d.) LHC 05/31/2013: EF 60%; 40% mLAD, 30% ISR m-dRCA; no interventions.   DDD  (degenerative disc disease), cervical    a.) s/p ACDF C5-C7; hardware in neck; patient appreciates stiffness and issues with mobility   Diastolic dysfunction    a.)  TTE 07/07/2021: EF 87.8%; normal PASP; trace TR/MR; G1DD.   Elevated LEFT hemidiaphragm    Gout    Headache(784.0)    Heart murmur    HLD (hyperlipidemia)    HOH (hard of hearing)    Has hearing aids, doesn't wear   Hyperkalemia    Hyperparathyroidism due to renal insufficiency (HCC)    Hypertension    Incomplete right bundle branch block (RBBB)    Insomnia    a.) takes zolpidem    Long term (current) use of immunomodulator    a.) on DMARD therapy (hydroxychloroquine ) for RA/SLE Dx.   Lumbar adjacent segment disease with spondylolisthesis    Multiple acquired cysts of kidney    On chronic clopidogrel  therapy    OSA (obstructive sleep apnea)    a.) does NOT use nocturnal pap therapy   Osteoarthritis    Pancreatic cyst    Peripheral vascular disease (HCC)    Pneumonia    Post-COVID chronic cough    Pulmonary nodule    RBBB (right bundle branch block)    Rectal bleeding    Rheumatoid arthritis (HCC)    a.) on DMARD; hydroxychloriquine   Shortness of breath 03/01/2023   Systemic lupus erythematosus (HCC)    T2DM (type 2 diabetes mellitus) (HCC)    Tendinitis of left wrist  Thickened endometrium    Thickened endometrium    Wears dentures    partial lower    Past Surgical History:  Procedure Laterality Date   ANEURYSM COILING  08/2013   ANTERIOR LATERAL LUMBAR FUSION WITH PERCUTANEOUS SCREW 1 LEVEL N/A 03/09/2023   Procedure: L3-4 LATERAL LUMBAR INTERBODY FUSION;  Surgeon: Clois Fret, MD;  Location: [facility];  Service: Neurosurgery;  Laterality: N/A;   APPLICATION OF INTRAOPERATIVE CT SCAN N/A 03/09/2023   Procedure: APPLICATION OF INTRAOPERATIVE CT SCAN;  Surgeon: Clois Fret, MD;  Location: [facility];  Service: Neurosurgery;  Laterality: N/A;   Attempted embolization of previously treated ACOM  aneurysm; unsuccessful N/A 01/28/2012   Location: [facility]   BACK SURGERY     BARTHOLIN CYST MARSUPIALIZATION     BICEPT TENODESIS Right 03/02/2022   Procedure: BICEPS TENODESIS;  Surgeon: Edie Norleen PARAS, MD;  Location: [facility];  Service: Orthopedics;  Laterality: Right;   BREAST CYST ASPIRATION Right 01/25/2012   FNA neg.   BREAST EXCISIONAL BIOPSY Left 06/26/2007   neg   BUNIONECTOMY Bilateral 2022   CARDIAC CATHETERIZATION Left 02/23/2007   Procedure: CARDIAC CATHETERIZATION; Location: [facility]; Surgeon: Denyse Bathe, MD   CARDIAC CATHETERIZATION Left 07/31/2007   Procedure: CARDIAC CATHETERIZATION; Location: [facility]; Surgeon: Denyse Bathe, MD   CARDIAC CATHETERIZATION Left 10/28/2010   Procedure: CARDIAC CATHETERIZATION; Location: [facility]; Surgeon: Denyse Bathe, MD   CARDIAC CATHETERIZATION Left 05/31/2013   Procedure: CARDIAC CATHETERIZATION; Location: [facility]; Surgeon: Denyse Bathe, MD   Carotid arteriogram; 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 N/A 07/18/2006   Location: [facility]   CATARACT EXTRACTION     CERVICAL FUSION N/A    Procedure: ACDF C5-C7   COLONOSCOPY WITH PROPOFOL  N/A 10/04/2019   Procedure: COLONOSCOPY WITH BIOPSY;  Surgeon: Unk Corinn Skiff, MD;  Location: [facility];  Service: Endoscopy;  Laterality: N/A;  Diabetic (borderline) - oral meds priority 3   CORONARY STENT INTERVENTION Left 02/27/2007   Procedure: CORONARY STENT INTERVENTION (3.5 x 15 mm Vision BMS to mRCA); Location: [facility]; Surgeon: Deatrice Cage, MD   Endovascular near complete obliteration of enlarging neck remnant of previously treated RIGHT MCA aneurysm using stent assisted coiling N/A 09/03/2010   Location: [facility]   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 N/A 10/03/2006   Location: [facility]   HYSTEROSCOPY WITH D & C N/A 08/04/2021   Procedure: DILATATION AND CURETTAGE /HYSTEROSCOPY;   Surgeon: Victor Claudell SAUNDERS, MD;  Location: [facility];  Service: Gynecology;  Laterality: N/A;   POLYPECTOMY N/A 10/04/2019   Procedure: POLYPECTOMY;  Surgeon: Unk Corinn Skiff, MD;  Location: [facility];  Service: Endoscopy;  Laterality: N/A;   REVERSE SHOULDER ARTHROPLASTY Right 03/02/2022   Procedure: REVERSE SHOULDER ARTHROPLASTY;  Surgeon: Edie Norleen PARAS, MD;  Location: [facility];  Service: Orthopedics;  Laterality: Right;   UPPER ESOPHAGEAL ENDOSCOPIC ULTRASOUND (EUS) N/A 04/15/2016   Procedure: UPPER ESOPHAGEAL ENDOSCOPIC ULTRASOUND (EUS);  Surgeon: Asberry DELENA Coffee, MD;  Location: [facility];  Service: Gastroenterology;  Laterality: N/A;    Social History   Socioeconomic History   Marital status: Widowed    Spouse name: Not on file   Number of children: Not on file   Years of education: Not on file   Highest education level: Not on file  Occupational History   Not on file  Tobacco Use   Smoking status: Never    Passive exposure: Never   Smokeless tobacco: Never  Vaping Use  Vaping status: Never Used  Substance and Sexual Activity   Alcohol use: Yes    Comment: Occasionally at christmas   Drug use: No   Sexual activity: Not Currently    Birth control/protection: Post-menopausal  Other Topics Concern   Not on file  Social History Narrative   Lives with daughter   Social Drivers of Health   Financial Resource Strain: Low Risk  (02/01/2024)   Overall Financial Resource Strain (CARDIA)    Difficulty of Paying Living Expenses: Not hard at all  Food Insecurity: No Food Insecurity (02/01/2024)   Hunger Vital Sign    Worried About Running Out of Food in the Last Year: Never true    Ran Out of Food in the Last Year: Never true  Transportation Needs: No Transportation Needs (02/01/2024)   PRAPARE - Administrator, Civil Service (Medical): No    Lack of Transportation (Non-Medical): No  Physical Activity: Insufficiently Active (02/01/2024)    Exercise Vital Sign    Days of Exercise per Week: 2 days    Minutes of Exercise per Session: 30 min  Stress: No Stress Concern Present (10/11/2023)   [employer] of Occupational Health - Occupational Stress Questionnaire    Feeling of Stress : Not at all  Social Connections: Moderately Isolated (10/11/2023)   Social Connection and Isolation Panel    Frequency of Communication with Friends and Family: More than three times a week    Frequency of Social Gatherings with Friends and Family: More than three times a week    Attends Religious Services: Never    Database administrator or Organizations: Yes    Attends Banker Meetings: Never    Marital Status: Widowed  Intimate Partner Violence: Not At Risk (02/01/2024)   Humiliation, Afraid, Rape, and Kick questionnaire    Fear of Current or Ex-Partner: No    Emotionally Abused: No    Physically Abused: No    Sexually Abused: No    Family History  Problem Relation Age of Onset   Breast cancer Paternal Aunt 13   Breast cancer Paternal Aunt    Heart disease Mother    Heart disease Father    Stroke Father    Diabetes Sister    Diabetes Brother     Allergies  Allergen Reactions   Isosorbide Dinitrate Other (See Comments)    Collapse   Amlodipine Swelling   Sulfa Antibiotics Itching   Tizanidine Hcl    Oxycodone  Itching   Sulfasalazine Itching    Outpatient Medications Prior to Visit  Medication Sig   albuterol  (VENTOLIN  HFA) 108 (90 Base) MCG/ACT inhaler Inhale 2 puffs into the lungs every 6 (six) hours as needed for wheezing or shortness of breath.   allopurinol  (ZYLOPRIM ) 100 MG tablet TAKE 1 TABLET BY MOUTH EVERY DAY   aspirin  EC 81 MG tablet Take 1 tablet (81 mg total) by mouth daily. Swallow whole.   calcitRIOL (ROCALTROL) 0.25 MCG capsule Take 0.25 mcg by mouth.   clotrimazole -betamethasone (LOTRISONE) cream APPLY TWICE A DAY TO GROIN AREA SKIN FOR 1 WEEK AND THEN AS NEEDED.   ezetimibe  (ZETIA ) 10 MG  tablet Take 1 tablet (10 mg total) by mouth daily.   fluticasone  (FLONASE ) 50 MCG/ACT nasal spray Place 2 sprays into both nostrils daily. SPRAY 2 SPRAYS INTO EACH NOSTRIL EVERY DAY   furosemide  (LASIX ) 40 MG tablet TAKE 1 TABLET BY MOUTH EVERY DAY   gabapentin  (NEURONTIN ) 100 MG capsule Take 1-3 capsules (100-300 mg  total) by mouth at bedtime.   glimepiride  (AMARYL ) 2 MG tablet TAKE 1 TABLET (2 MG TOTAL) BY MOUTH SEE ADMIN INSTRUCTIONS. TAKE 1 MG DAILY, MAY INCREASE TO 2 MG IF BLOOD SUGAR IS OVER 175   hydrALAZINE  (APRESOLINE ) 100 MG tablet Take 1 tablet (100 mg total) by mouth 2 (two) times daily.   hydroxychloroquine  (PLAQUENIL ) 200 MG tablet Take 200 mg by mouth daily.   labetalol  (NORMODYNE ) 300 MG tablet TAKE 1 TABLET BY MOUTH TWICE A DAY   levocetirizine (XYZAL  ALLERGY 24HR) 5 MG tablet Take 1 tablet (5 mg total) by mouth every evening.   losartan  (COZAAR ) 100 MG tablet TAKE 1 TABLET BY MOUTH EVERY DAY IN THE MORNING   methocarbamol  (ROBAXIN ) 500 MG tablet TAKE 1 TABLET BY MOUTH EVERY 6 HOURS AS NEEDED FOR MUSCLE SPASMS.   nitroGLYCERIN  (NITROSTAT ) 0.6 MG SL tablet TAKE 1 TABLET BY MOUTH AS NEEDED FOR CP. WAIT 5 MINUTES BEFORE NEXT DOSE. PROCEED TO ER IF NO RELIEF AFTER 3 DOSES   pantoprazole (PROTONIX) 40 MG tablet TAKE 1 TABLET BY MOUTH EVERY DAY   pantoprazole (PROTONIX) 40 MG tablet Take 40 mg by mouth. (Patient not taking: Reported on 02/07/2024)   rosuvastatin  (CRESTOR ) 40 MG tablet TAKE 1 TABLET BY MOUTH EVERY DAY   senna (SENOKOT) 8.6 MG TABS tablet Take 1 tablet (8.6 mg total) by mouth daily as needed for mild constipation.   SYMBICORT 80-4.5 MCG/ACT inhaler INHALE 1 PUFF BY MOUTH TWICE A DAY   traMADol  (ULTRAM ) 50 MG tablet Take 1 tablet (50 mg total) by mouth every 6 (six) hours as needed for moderate pain (pain score 4-6).   tretinoin (RETIN-A) 0.05 % cream APPLY TO AFFECTED AREA EVERY DAY AT BEDTIME   zolpidem  (AMBIEN ) 5 MG tablet TAKE 1 TABLET BY MOUTH EVERY DAY AT BEDTIME AS  NEEDED FOR SLEEP   No facility-administered medications prior to visit.    Review of Systems  Constitutional:  Positive for malaise/fatigue. Negative for chills, fever and weight loss.  HENT: Negative.  Negative for sore throat.   Eyes: Negative.   Respiratory: Negative.  Negative for cough and shortness of breath.   Cardiovascular: Negative.  Negative for chest pain, palpitations and leg swelling.  Gastrointestinal: Negative.  Negative for abdominal pain, constipation, diarrhea, heartburn, nausea and vomiting.  Genitourinary: Negative.  Negative for dysuria and flank pain.  Musculoskeletal:  Positive for back pain and joint pain. Negative for myalgias.  Skin: Negative.   Neurological: Negative.  Negative for dizziness and headaches.  Endo/Heme/Allergies: Negative.   Psychiatric/Behavioral: Negative.  Negative for depression and suicidal ideas. The patient is not nervous/anxious.        Objective:   BP 138/82   Pulse 70   Ht 5' 3 (1.6 m)   Wt 178 lb 9.6 oz (81 kg)   SpO2 95%   BMI 31.64 kg/m   Vitals:   02/07/24 1311  BP: 138/82  Pulse: 70  Height: 5' 3 (1.6 m)  Weight: 178 lb 9.6 oz (81 kg)  SpO2: 95%  BMI (Calculated): 31.65    Physical Exam Vitals and nursing note reviewed.  Constitutional:      Appearance: Normal appearance.  HENT:     Head: Normocephalic and atraumatic.     Nose: Nose normal.     Mouth/Throat:     Mouth: Mucous membranes are moist.     Pharynx: Oropharynx is clear.  Eyes:     Conjunctiva/sclera: Conjunctivae normal.     Pupils: Pupils  are equal, round, and reactive to light.  Cardiovascular:     Rate and Rhythm: Normal rate and regular rhythm.     Pulses: Normal pulses.     Heart sounds: Normal heart sounds. No murmur heard. Pulmonary:     Effort: Pulmonary effort is normal.     Breath sounds: Normal breath sounds. No wheezing.  Abdominal:     General: Bowel sounds are normal.     Palpations: Abdomen is soft.     Tenderness:  There is no abdominal tenderness. There is no right CVA tenderness or left CVA tenderness.  Musculoskeletal:        General: Normal range of motion.     Cervical back: Normal range of motion.     Right lower leg: No edema.     Left lower leg: No edema.  Skin:    General: Skin is warm and dry.  Neurological:     General: No focal deficit present.     Mental Status: She is alert and oriented to person, place, and time.  Psychiatric:        Mood and Affect: Mood normal.        Behavior: Behavior normal.      No results found for any visits on 02/07/24.  Recent Results (from the past 2160 hours)  POCT CBG (Fasting - Glucose)     Status: Abnormal   Collection Time: 11/17/23 11:23 AM  Result Value Ref Range   Glucose Fasting, POC 116 (A) 70 - 99 mg/dL  POCT CBG (Fasting - Glucose)     Status: Abnormal   Collection Time: 11/24/23  2:41 PM  Result Value Ref Range   Glucose Fasting, POC 231 (A) 70 - 99 mg/dL  Comprehensive metabolic panel with GFR     Status: Abnormal   Collection Time: 12/05/23 12:43 PM  Result Value Ref Range   Sodium 147 (H) 135 - 145 mmol/L   Potassium 4.5 3.5 - 5.1 mmol/L   Chloride 119 (H) 98 - 111 mmol/L   CO2 20 (L) 22 - 32 mmol/L   Glucose, Bld 139 (H) 70 - 99 mg/dL    Comment: Glucose reference range applies only to samples taken after fasting for at least 8 hours.   BUN 90 (H) 8 - 23 mg/dL   Creatinine, Ser 6.82 (H) 0.44 - 1.00 mg/dL   Calcium  8.7 (L) 8.9 - 10.3 mg/dL   Total Protein 6.1 (L) 6.5 - 8.1 g/dL   Albumin  3.6 3.5 - 5.0 g/dL   AST 17 15 - 41 U/L   ALT 15 0 - 44 U/L   Alkaline Phosphatase 50 38 - 126 U/L   Total Bilirubin 1.0 0.0 - 1.2 mg/dL   GFR, Estimated 15 (L) >60 mL/min    Comment: (NOTE) Calculated using the CKD-EPI Creatinine Equation (2021)    Anion gap 8 5 - 15    Comment: Performed at [facility], [address], [place], [place] [address]  Allergen Profile, Mold     Status: None   Collection Time: 01/17/24  2:27  PM  Result Value Ref Range   Class Description Allergens Comment     Comment:     Levels of Specific IgE       Class  Description of Class     ---------------------------  -----  --------------------                    < 0.10  0         Negative            0.10 -    0.31         0/I       Equivocal/Low            0.32 -    0.55         I         Low            0.56 -    1.40         II        Moderate            1.41 -    3.90         III       High            3.91 -   19.00         IV        Very High           19.01 -  100.00         V         Very High                   >100.00         VI        Very High    Penicillium Chrysogen IgE <0.10 Class 0 kU/L   Cladosporium Herbarum IgE <0.10 Class 0 kU/L   Aspergillus Fumigatus IgE <0.10 Class 0 kU/L   Mucor Racemosus IgE <0.10 Class 0 kU/L   Candida Albicans IgE <0.10 Class 0 kU/L   Alternaria Alternata IgE <0.10 Class 0 kU/L   Setomelanomma Rostrat <0.10 Class 0 kU/L   M009-IgE Fusarium proliferatum <0.10 Class 0 kU/L   Stemphylium Herbarum IgE <0.10 Class 0 kU/L   Aureobasidi Pullulans IgE <0.10 Class 0 kU/L   Phoma Betae IgE <0.10 Class 0 kU/L   M014-IgE Epicoccum purpur <0.10 Class 0 kU/L  ECHOCARDIOGRAM COMPLETE     Status: None   Collection Time: 01/30/24  4:44 PM  Result Value Ref Range   AR max vel 2.13 cm2   AV Peak grad 10.8 mmHg   Ao pk vel 1.64 m/s   S' Lateral 4.51 cm   Area-P 1/2 2.60 cm2   AV Area VTI 2.06 cm2   AV Mean grad 6.0 mmHg   AV Area mean vel 1.99 cm2   Est EF 55 - 60%       Assessment & Plan:  Start po Celexa . Continue other meds. Awaits referrals. Eye exam . Problem List Items Addressed This Visit     Rheumatoid arthritis involving multiple sites with positive rheumatoid factor (HCC)   Type 2 diabetes mellitus with hyperglycemia, without long-term current use of insulin  (HCC)   Hypertension associated with diabetes (HCC)   Combined hyperlipidemia associated with type 2 diabetes mellitus  (HCC)   Chronic kidney disease, stage IV (severe) (HCC) - Primary   Relevant Orders   Ambulatory referral to Nephrology   Other Visit Diagnoses       GAD (generalized anxiety disorder)       Relevant Medications   citalopram  (CELEXA ) 10 MG tablet       Return in about 3 months (around 05/09/2024).   Total time spent: 30 minutes  FERNAND FREDY RAMAN, MD  02/07/2024   This document may have been  prepared by [employer] and as such may include unintentional dictation errors.

## 2024-02-12 ENCOUNTER — Other Ambulatory Visit: Payer: Self-pay | Admitting: Cardiology

## 2024-02-14 ENCOUNTER — Encounter: Payer: Self-pay | Admitting: Student in an Organized Health Care Education/Training Program

## 2024-02-14 ENCOUNTER — Ambulatory Visit
Attending: Student in an Organized Health Care Education/Training Program | Admitting: Student in an Organized Health Care Education/Training Program

## 2024-02-14 VITALS — BP 147/72 | HR 65 | Temp 95.0°F | Resp 16 | Ht 63.0 in | Wt 180.0 lb

## 2024-02-14 DIAGNOSIS — M47816 Spondylosis without myelopathy or radiculopathy, lumbar region: Secondary | ICD-10-CM | POA: Diagnosis not present

## 2024-02-14 DIAGNOSIS — M533 Sacrococcygeal disorders, not elsewhere classified: Secondary | ICD-10-CM | POA: Insufficient documentation

## 2024-02-14 DIAGNOSIS — K862 Cyst of pancreas: Secondary | ICD-10-CM | POA: Diagnosis not present

## 2024-02-14 DIAGNOSIS — M961 Postlaminectomy syndrome, not elsewhere classified: Secondary | ICD-10-CM | POA: Insufficient documentation

## 2024-02-14 DIAGNOSIS — G5703 Lesion of sciatic nerve, bilateral lower limbs: Secondary | ICD-10-CM | POA: Insufficient documentation

## 2024-02-14 MED ORDER — HYDROCODONE-ACETAMINOPHEN 5-325 MG PO TABS: 1.0000 | ORAL_TABLET | Freq: Three times a day (TID) | ORAL | 0 refills | Status: AC | PRN

## 2024-02-14 NOTE — Patient Instructions (Signed)
 GENERAL RISKS AND COMPLICATIONS  What are the risk, side effects and possible complications? Generally speaking, most procedures are safe.  However, with any procedure there are risks, side effects, and the possibility of complications.  The risks and complications are dependent upon the sites that are lesioned, or the type of nerve block to be performed.  The closer the procedure is to the spine, the more serious the risks are.  Great care is taken when placing the radio frequency needles, block needles or lesioning probes, but sometimes complications can occur. Infection: Any time there is an injection through the skin, there is a risk of infection.  This is why sterile conditions are used for these blocks.  There are four possible types of infection. Localized skin infection. Central Nervous System Infection-This can be in the form of Meningitis, which can be deadly. Epidural Infections-This can be in the form of an epidural abscess, which can cause pressure inside of the spine, causing compression of the spinal cord with subsequent paralysis. This would require an emergency surgery to decompress, and there are no guarantees that the patient would recover from the paralysis. Discitis-This is an infection of the intervertebral discs.  It occurs in about 1% of discography procedures.  It is difficult to treat and it may lead to surgery.        2. Pain: the needles have to go through skin and soft tissues, will cause soreness.       3. Damage to internal structures:  The nerves to be lesioned may be near blood vessels or    other nerves which can be potentially damaged.       4. Bleeding: Bleeding is more common if the patient is taking blood thinners such as  aspirin , Coumadin, Ticiid, Plavix , etc., or if he/she have some genetic predisposition  such as hemophilia. Bleeding into the spinal canal can cause compression of the spinal  cord with subsequent paralysis.  This would require an emergency  surgery to  decompress and there are no guarantees that the patient would recover from the  paralysis.       5. Pneumothorax:  Puncturing of a lung is a possibility, every time a needle is introduced in  the area of the chest or upper back.  Pneumothorax refers to free air around the  collapsed lung(s), inside of the thoracic cavity (chest cavity).  Another two possible  complications related to a similar event would include: Hemothorax and Chylothorax.   These are variations of the Pneumothorax, where instead of air around the collapsed  lung(s), you may have blood or chyle, respectively.       6. Spinal headaches: They may occur with any procedures in the area of the spine.       7. Persistent CSF (Cerebro-Spinal Fluid) leakage: This is a rare problem, but may occur  with prolonged intrathecal or epidural catheters either due to the formation of a fistulous  track or a dural tear.       8. Nerve damage: By working so close to the spinal cord, there is always a possibility of  nerve damage, which could be as serious as a permanent spinal cord injury with  paralysis.       9. Death:  Although rare, severe deadly allergic reactions known as Anaphylactic  reaction can occur to any of the medications used.      10. Worsening of the symptoms:  We can always make thing worse.  What are the chances  of something like this happening? Chances of any of this occuring are extremely low.  By statistics, you have more of a chance of getting killed in a motor vehicle accident: while driving to the hospital than any of the above occurring .  Nevertheless, you should be aware that they are possibilities.  In general, it is similar to taking a shower.  Everybody knows that you can slip, hit your head and get killed.  Does that mean that you should not shower again?  Nevertheless always keep in mind that statistics do not mean anything if you happen to be on the wrong side of them.  Even if a procedure has a 1 (one) in a  1,000,000 (million) chance of going wrong, it you happen to be that one..Also, keep in mind that by statistics, you have more of a chance of having something go wrong when taking medications.  Who should not have this procedure? If you are on a blood thinning medication (e.g. Coumadin, Plavix , see list of Blood Thinners), or if you have an active infection going on, you should not have the procedure.  If you are taking any blood thinners, please inform your physician.  How should I prepare for this procedure? Do not eat or drink anything at least six hours prior to the procedure. Bring a driver with you .  It cannot be a taxi. Come accompanied by an adult that can drive you back, and that is strong enough to help you if your legs get weak or numb from the local anesthetic. Take all of your medicines the morning of the procedure with just enough water  to swallow them. If you have diabetes, make sure that you are scheduled to have your procedure done first thing in the morning, whenever possible. If you have diabetes, take only half of your insulin  dose and notify our nurse that you have done so as soon as you arrive at the clinic. If you are diabetic, but only take blood sugar pills (oral hypoglycemic), then do not take them on the morning of your procedure.  You may take them after you have had the procedure. Do not take aspirin  or any aspirin -containing medications, at least eleven (11) days prior to the procedure.  They may prolong bleeding. Wear loose fitting clothing that may be easy to take off and that you would not mind if it got stained with Betadine  or blood. Do not wear any jewelry or perfume Remove any nail coloring.  It will interfere with some of our monitoring equipment.  NOTE: Remember that this is not meant to be interpreted as a complete list of all possible complications.  Unforeseen problems may occur.  BLOOD THINNERS The following drugs contain aspirin  or other products,  which can cause increased bleeding during surgery and should not be taken for 2 weeks prior to and 1 week after surgery.  If you should need take something for relief of minor pain, you may take acetaminophen  which is found in Tylenol ,m Datril, Anacin-3 and Panadol. It is not blood thinner. The products listed below are.  Do not take any of the products listed below in addition to any listed on your instruction sheet.  A.P.C or A.P.C with Codeine Codeine Phosphate Capsules #3 Ibuprofen Ridaura  ABC compound Congesprin Imuran rimadil  Advil Cope Indocin Robaxisal  Alka-Seltzer Effervescent Pain Reliever and Antacid Coricidin or Coricidin-D  Indomethacin Rufen  Alka-Seltzer plus Cold Medicine Cosprin Ketoprofen S-A-C Tablets  Anacin Analgesic Tablets or Capsules Coumadin  Korlgesic Salflex  Anacin Extra Strength Analgesic tablets or capsules CP-2 Tablets Lanoril Salicylate  Anaprox  Cuprimine Capsules Levenox Salocol  Anexsia-D Dalteparin Magan Salsalate  Anodynos Darvon compound Magnesium  Salicylate Sine-off  Ansaid Dasin Capsules Magsal Sodium Salicylate  Anturane Depen Capsules Marnal Soma  APF Arthritis pain formula Dewitt's Pills Measurin  Stanback  Argesic Dia-Gesic Meclofenamic Sulfinpyrazone  Arthritis Bayer Timed Release Aspirin  Diclofenac Meclomen Sulindac  Arthritis pain formula Anacin Dicumarol Medipren Supac  Analgesic (Safety coated) Arthralgen Diffunasal Mefanamic Suprofen  Arthritis Strength Bufferin Dihydrocodeine Mepro Compound Suprol  Arthropan liquid Dopirydamole Methcarbomol with Aspirin  Synalgos  ASA tablets/Enseals Disalcid Micrainin Tagament  Ascriptin Doan's Midol Talwin  Ascriptin A/D Dolene Mobidin Tanderil  Ascriptin Extra Strength Dolobid Moblgesic Ticlid  Ascriptin with Codeine Doloprin or Doloprin with Codeine Momentum Tolectin  Asperbuf Duoprin Mono-gesic Trendar  Aspergum Duradyne Motrin or Motrin IB Triminicin  Aspirin  plain, buffered or enteric coated  Durasal Myochrisine Trigesic  Aspirin  Suppositories Easprin Nalfon Trillsate  Aspirin  with Codeine Ecotrin Regular or Extra Strength Naprosyn  Uracel  Atromid-S Efficin Naproxen  Ursinus  Auranofin Capsules Elmiron Neocylate Vanquish  Axotal Emagrin Norgesic Verin  Azathioprine Empirin or Empirin with Codeine Normiflo Vitamin E  Azolid Emprazil Nuprin Voltaren  Bayer Aspirin  plain, buffered or children's or timed BC Tablets or powders Encaprin Orgaran Warfarin Sodium  Buff-a-Comp Enoxaparin Orudis Zorpin  Buff-a-Comp with Codeine Equegesic Os-Cal-Gesic   Buffaprin Excedrin plain, buffered or Extra Strength Oxalid   Bufferin Arthritis Strength Feldene Oxphenbutazone   Bufferin plain or Extra Strength Feldene Capsules Oxycodone  with Aspirin    Bufferin with Codeine Fenoprofen Fenoprofen Pabalate or Pabalate-SF   Buffets II Flogesic Panagesic   Buffinol plain or Extra Strength Florinal or Florinal with Codeine Panwarfarin   Buf-Tabs Flurbiprofen Penicillamine   Butalbital Compound Four-way cold tablets Penicillin   Butazolidin Fragmin Pepto-Bismol   Carbenicillin Geminisyn Percodan   Carna Arthritis Reliever Geopen Persantine    Carprofen Gold's salt Persistin   Chloramphenicol Goody's Phenylbutazone   Chloromycetin Haltrain Piroxlcam   Clmetidine heparin  Plaquenil    Cllnoril Hyco-pap Ponstel   Clofibrate Hydroxy chloroquine Propoxyphen         Before stopping any of these medications, be sure to consult the physician who ordered them.  Some, such as Coumadin (Warfarin) are ordered to prevent or treat serious conditions such as deep thrombosis, pumonary embolisms, and other heart problems.  The amount of time that you may need off of the medication may also vary with the medication and the reason for which you were taking it.  If you are taking any of these medications, please make sure you notify your pain physician before you undergo any procedures.         Trigger Point  Injections Patient Information  Description: Trigger points are areas of muscle sensitive to touch which cause pain with movement, sometimes felt some distance from the site of palpation.  Usually the muscle containing these trigger points if felt as a tight band or knot.   The area of maximum tenderness or trigger point is identified, and after antiseptic preparation of the skin, a small needle is placed into this site.  Reproduction of the pain often occurs and numbing medicine (local anesthetic) is injected into the site, sometimes along with steroid preparation.  The entire block usually lasts less than 5 minutes.  Conditions which may be treated by trigger points:  Muscular pain and spasm Nerve irritation  Preparation for the injection:  Do not eat any solid food or dairy products within 8 hours  of your appointment. You may drink clear liquids up to 3 hours before appointment.  Clear liquids include water , black coffee, juice or soda.  No milk or cream please. You may take your regular medications, including pain medications, with a sip of water  before your appointment.  Diabetics should hold regular insulin  ( if take separately) and take 1/2 normal NPH dose the morning of the procedure.  Carry some sugar containing items with you to your appointment. A driver must accompany you and be prepared to drive you home after your procedure.  Bring all your current medications with you. An IV may be inserted and sedation may be given at the discretion of the physician.  A blood pressure cuff, EKG, and other monitors will often be applied during the procedure.  Some patients may need to have extra oxygen administered for a short period. You will be asked to provide medical information, including your allergies and medications, prior to the procedure.  We must know immediately if you are taking blood thinners (like Coumadin/Warfarin) or if you are allergic to IV iodine  contrast (dye).  We must know if  you could possibly be pregnant.  Possible side-effects:  Bleeding from needle site Infection (rare, may require surgery) Nerve injury (rare) Numbness & tingling (temporary) Punctured lung (if injection around chest) Light-headedness (temporary) Pain at injection site (several days) Decreased blood pressure (rare, temporary) Weakness in arm/leg (temporary)  Call if you experience:  Hive or difficulty breathing (go to the emergency room) Inflammation or drainage at the injection site(s)  Please note:  Although the local anesthetic injected can often make your painful muscle feel good for several hours after the injection, the pain may return.  It takes 3-7 days for steroids to work.  You may not notice any pain relief for at least one week.  If effective, we will often do a series of injections spaced 3-6 weeks apart to maximally decrease your pain.  If you have any questions please call [phone] [facility] ([facility] Injection Patient Information  Description: The sacroiliac joint connects the scrum (very low back and tailbone) to the ilium (a pelvic bone which also forms half of the hip joint).  Normally this joint experiences very little motion.  When this joint becomes inflamed or unstable low back and or hip and pelvis pain may result.  Injection of this joint with local anesthetics (numbing medicines) and steroids can provide diagnostic information and reduce pain.  This injection is performed with the aid of x-ray guidance into the tailbone area while you are lying on your stomach.   You may experience an electrical sensation down the leg while this is being done.  You may also experience numbness.  We also may ask if we are reproducing your normal pain during the injection.  Conditions which may be treated SI injection:  Low back, buttock, hip or leg pain  Preparation for the Injection:  Do not eat any solid food or dairy  products within 8 hours of your appointment.  You may drink clear liquids up to 3 hours before appointment.  Clear liquids include water , black coffee, juice or soda.  No milk or cream please. You may take your regular medications, including pain medications with a sip of water  before your appointment.  Diabetics should hold regular insulin  (if take separately) and take 1/2 normal NPH dose the morning of the procedure.  Carry some sugar containing items with you to your appointment. A  driver must accompany you and be prepared to drive you home after your procedure. Bring all of your current medications with you. An IV may be inserted and sedation may be given at the discretion of the physician. A blood pressure cuff, EKG and other monitors will often be applied during the procedure.  Some patients may need to have extra oxygen administered for a short period.  You will be asked to provide medical information, including your allergies, prior to the procedure.  We must know immediately if you are taking blood thinners (like Coumadin/Warfarin) or if you are allergic to IV iodine  contrast (dye).  We must know if you could possible be pregnant.  Possible side effects:  Bleeding from needle site Infection (rare, may require surgery) Nerve injury (rare) Numbness & tingling (temporary) A brief convulsion or seizure Light-headedness (temporary) Pain at injection site (several days) Decreased blood pressure (temporary) Weakness in the leg (temporary)   Call if you experience:  New onset weakness or numbness of an extremity below the injection site that last more than 8 hours. Hives or difficulty breathing ( go to the emergency room) Inflammation or drainage at the injection site Any new symptoms which are concerning to you  Please note:  Although the local anesthetic injected can often make your back/ hip/ buttock/ leg feel good for several hours after the injections, the pain will likely  return.  It takes 3-7 days for steroids to work in the sacroiliac area.  You may not notice any pain relief for at least that one week.  If effective, we will often do a series of three injections spaced 3-6 weeks apart to maximally decrease your pain.  After the initial series, we generally will wait some months before a repeat injection of the same type.  If you have any questions, please call [phone] [facility]  Facet Blocks Patient Information  Description: The facets are joints in the spine between the vertebrae.  Like any joints in the body, facets can become irritated and painful.  Arthritis can also effect the facets.  By injecting steroids and local anesthetic in and around these joints, we can temporarily block the nerve supply to them.  Steroids act directly on irritated nerves and tissues to reduce selling and inflammation which often leads to decreased pain.  Facet blocks may be done anywhere along the spine from the neck to the low back depending upon the location of your pain.   After numbing the skin with local anesthetic (like Novocaine), a small needle is passed onto the facet joints under x-ray guidance.  You may experience a sensation of pressure while this is being done.  The entire block usually lasts about 15-25 minutes.   Conditions which may be treated by facet blocks:  Low back/buttock pain Neck/shoulder pain Certain types of headaches  Preparation for the injection:  Do not eat any solid food or dairy products within 8 hours of your appointment. You may drink clear liquid up to 3 hours before appointment.  Clear liquids include water , black coffee, juice or soda.  No milk or cream please. You may take your regular medication, including pain medications, with a sip of water  before your appointment.  Diabetics should hold regular insulin  (if taken separately) and take 1/2 normal NPH dose the morning of the procedure.  Carry some  sugar containing items with you to your appointment. A driver must accompany you and be prepared to drive you home after  your procedure. Bring all your current medications with you. An IV may be inserted and sedation may be given at the discretion of the physician. A blood pressure cuff, EKG and other monitors will often be applied during the procedure.  Some patients may need to have extra oxygen administered for a short period. You will be asked to provide medical information, including your allergies and medications, prior to the procedure.  We must know immediately if you are taking blood thinners (like Coumadin/Warfarin) or if you are allergic to IV iodine  contrast (dye).  We must know if you could possible be pregnant.  Possible side-effects:  Bleeding from needle site Infection (rare, may require surgery) Nerve injury (rare) Numbness & tingling (temporary) Difficulty urinating (rare, temporary) Spinal headache (a headache worse with upright posture) Light-headedness (temporary) Pain at injection site (serveral days) Decreased blood pressure (rare, temporary) Weakness in arm/leg (temporary) Pressure sensation in back/neck (temporary)   Call if you experience:  Fever/chills associated with headache or increased back/neck pain Headache worsened by an upright position New onset, weakness or numbness of an extremity below the injection site Hives or difficulty breathing (go to the emergency room) Inflammation or drainage at the injection site(s) Severe back/neck pain greater than usual New symptoms which are concerning to you  Please note:  Although the local anesthetic injected can often make your back or neck feel good for several hours after the injection, the pain will likely return. It takes 3-7 days for steroids to work.  You may not notice any pain relief for at least one week.  If effective, we will often do a series of 2-3 injections spaced 3-6 weeks apart to maximally  decrease your pain.  After the initial series, you may be a candidate for a more permanent nerve block of the facets.  If you have any questions, please call #[phone] [facility]

## 2024-02-14 NOTE — Progress Notes (Signed)
 PROVIDER NOTE: Interpretation of information contained herein should be left to medically-trained personnel. Specific patient instructions are provided elsewhere under Patient Instructions section of medical record. This document was created in part using AI and STT-dictation technology, any transcriptional errors that may result from this process are unintentional.  Patient: [name]  Service: E/M   PCP: Fernand Fredy RAMAN, MD  DOB: [birth year]  DOS: 02/14/2024  Provider: Wallie Sherry, MD  MRN: [id number]  Delivery: Face-to-face  Specialty: Interventional Pain Management  Type: Established Patient  Setting: Ambulatory outpatient facility  Specialty designation: 09  Referring Prov.: Fernand Fredy RAMAN, MD  Location: Outpatient office facility       History of present illness (HPI) [name], a 78 y.o. year old female, is here today because of her Lumbar facet arthropathy [M47.816]. [name] primary complain today is Hip Pain (bilat)   Pain Assessment: Severity of Chronic pain is reported as a 7 /10. Location: Hip Right, Left/across lower back. Onset: More than a month ago. Quality: Throbbing. Timing: Constant. Modifying factor(s): nerve block. Vitals:  height is 5' 3 (1.6 m) and weight is 180 lb (81.6 kg). Her temperature is 95 F (35 C) (abnormal). Her blood pressure is 147/72 (abnormal) and her pulse is 65. Her respiration is 16 and oxygen saturation is 99%.  BMI: Estimated body mass index is 31.89 kg/m as calculated from the following:   Height as of this encounter: 5' 3 (1.6 m).   Weight as of this encounter: 180 lb (81.6 kg).  Last encounter: 09/26/2023. Last procedure: 08/24/2023.  Reason for encounter: medication management and worsening low back pain.   Discussed the use of AI scribe software for clinical note transcription with the patient, who gave verbal consent to proceed.  History of Present Illness   Shanece CALISA Leon is a 78 year old female with chronic  back pain who presents for follow-up regarding pain management.  She reports persistent pain despite a previous cluneal nerve block performed in March, which did not provide relief. The pain is significant enough that she has marked the areas of pain with a marker.  It is in her lower lumbar spine at the level of her lumbar spinal fusion and slightly below it.  She is currently prescribed gabapentin  and hydrocodone  for pain management. Gabapentin  causes sleepiness, so she has not taken it recently. She still has some hydrocodone  left but is unsure about the exact amount. She has a history of extensive back surgery and hardware placement, which limits her ability to walk far.  No current use of blood thinners as she has been taken off Plavix .       Pharmacotherapy Assessment   Hydrocodone  5 mg 3 times daily as needed, quantity 90; previous prescription for 01/08/2024  09/26/2023 09/26/2023  2 Hydrocodone -Acetamin 5-325 Mg 90.00 30 Bi Lat [id number] Nor (2541) 0/0 15.00 MME Medicare [place]   Monitoring: [place] PMP: PDMP reviewed during this encounter.       Pharmacotherapy: No side-effects or adverse reactions reported. Compliance: No problems identified. Effectiveness: Clinically acceptable.  No notes on file  UDS:  Summary  Date Value Ref Range Status  08/04/2023 FINAL  Final    Comment:    ==================================================================== Compliance Drug Analysis, Ur ==================================================================== Test                             Result       Flag  Units  Drug Present and Declared for Prescription Verification   Gabapentin                      PRESENT      EXPECTED   Methocarbamol                   PRESENT      EXPECTED   Zolpidem                        PRESENT      EXPECTED   Zolpidem  Acid                  PRESENT      EXPECTED    Zolpidem  acid is an expected metabolite of zolpidem .  Drug Absent but Declared for Prescription  Verification   Tramadol                        Not Detected UNEXPECTED ng/mg creat ==================================================================== Test                      Result    Flag   Units      Ref Range   Creatinine              187              mg/dL      >=79 ==================================================================== Declared Medications:  The flagging and interpretation on this report are based on the  following declared medications.  Unexpected results may arise from  inaccuracies in the declared medications.   **Note: The testing scope of this panel includes these medications:   Gabapentin  (Neurontin )  Methocarbamol  (Robaxin )  Tramadol    **Note: The testing scope of this panel does not include small to  moderate amounts of these reported medications:   Zolpidem  (Ambien )   **Note: The testing scope of this panel does not include the  following reported medications:   Allopurinol  (Zyloprim )  Betamethasone (Lotrisone)  Budesonide (Symbicort)  Clotrimazole  (Lotrisone)  Fluticasone  (Flonase )  Formoterol  (Symbicort)  Furosemide  (Lasix )  Glimepiride  (Amaryl )  Hydralazine  (Apresoline )  Hydroxychloroquine  (Plaquenil )  Losartan  (Cozaar )  Nitroglycerin  (Nitrostat )  Normodyne   Pantoprazole (Protonix)  Rosuvastatin  (Crestor )  Sennosides (Senokot)  Sodium Zirconium Cyclosilicate (Lokelma)  Tretinoin (Retin-A) ==================================================================== For clinical consultation, please call [phone]. ====================================================================     No results found for: CBDTHCR No results found for: D8THCCBX No results found for: D9THCCBX  ROS  Constitutional: Denies any fever or chills Gastrointestinal: No reported hemesis, hematochezia, vomiting, or acute GI distress Musculoskeletal: Low back pain Neurological: No reported episodes of acute onset apraxia, aphasia, dysarthria, agnosia,  amnesia, paralysis, loss of coordination, or loss of consciousness  Medication Review  Cyanocobalamin, HYDROcodone -acetaminophen , albuterol , allopurinol , aspirin  EC, budesonide-formoterol , calcitRIOL, citalopram , clotrimazole -betamethasone, ezetimibe , fluticasone , furosemide , gabapentin , glimepiride , hydrALAZINE , hydroxychloroquine , labetalol , levocetirizine, losartan , methocarbamol , nitroGLYCERIN , pantoprazole, rosuvastatin , senna, traMADol , tretinoin, and zolpidem   History Review  Allergy: Ms. Rosner is allergic to isosorbide dinitrate, amlodipine, sulfa antibiotics, tizanidine hcl, oxycodone , and sulfasalazine. Drug: Ms. Brymer  reports no history of drug use. Alcohol:  reports current alcohol use. Tobacco:  reports that she has never smoked. She has never been exposed to tobacco smoke. She has never used smokeless tobacco. Social: Ms. Eddinger  reports that she has never smoked. She has never been exposed to tobacco smoke. She has never used smokeless tobacco. She reports current alcohol use. She reports that she does not use  drugs. Medical:  has a past medical history of Anemia of chronic renal disease, Arthritis, Cerebral aneurysm (06/07/2006), CKD (chronic kidney disease), stage IV (HCC), Complication of anesthesia, Coronary artery disease (02/23/2007), DDD (degenerative disc disease), cervical, Diastolic dysfunction, Elevated LEFT hemidiaphragm, Gout, Headache(784.0), Heart murmur, HLD (hyperlipidemia), HOH (hard of hearing), Hyperkalemia, Hyperparathyroidism due to renal insufficiency (HCC), Hypertension, Incomplete right bundle branch block (RBBB), Insomnia, Long term (current) use of immunomodulator, Lumbar adjacent segment disease with spondylolisthesis, Multiple acquired cysts of kidney, On chronic clopidogrel  therapy, OSA (obstructive sleep apnea), Osteoarthritis, Pancreatic cyst, Peripheral vascular disease (HCC), Pneumonia, Post-COVID chronic cough, Pulmonary nodule, RBBB (right  bundle branch block), Rectal bleeding, Rheumatoid arthritis (HCC), Shortness of breath (03/01/2023), Systemic lupus erythematosus (HCC), T2DM (type 2 diabetes mellitus) (HCC), Tendinitis of left wrist, Thickened endometrium, Thickened endometrium, and Wears dentures. Surgical: Ms. Grether  has a past surgical history that includes Cataract extraction; Cervical fusion (N/A); Cardiac catheterization (Left, 02/23/2007); Aneurysm coiling (08/2013); Back surgery; Bartholin cyst marsupialization; Upper esophageal endoscopic ultrasound (eus) (N/A, 04/15/2016); Breast cyst aspiration (Right, 01/25/2012); Colonoscopy with propofol  (N/A, 10/04/2019); polypectomy (N/A, 10/04/2019); Breast excisional biopsy (Left, 06/26/2007); Bunionectomy (Bilateral, 2022); CORONARY STENT INTERVENTION (Left, 02/27/2007); Cardiac catheterization (Left, 07/31/2007); Cardiac catheterization (Left, 10/28/2010); Cardiac catheterization (Left, 05/31/2013); Carotid arteriogram; 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 (N/A, 07/18/2006); 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 Family: family history includes Breast cancer in her paternal aunt; Breast cancer (age of onset: 50) in her paternal aunt; Diabetes in her brother and sister; Heart disease in her father and mother; Stroke in her father.  Laboratory Chemistry Profile   Renal Lab Results   Component Value Date   BUN 90 (H) 12/05/2023   CREATININE 3.17 (H) 12/05/2023   BCR 19 10/11/2023   GFRAA 27 (L) 09/11/2019   GFRNONAA 15 (L) 12/05/2023    Hepatic Lab Results  Component Value Date   AST 17 12/05/2023   ALT 15 12/05/2023   ALBUMIN  3.6 12/05/2023   ALKPHOS 50 12/05/2023   LIPASE 35 03/13/2023    Electrolytes Lab Results  Component Value Date   NA 147 (H) 12/05/2023   K 4.5 12/05/2023   CL 119 (H) 12/05/2023   CALCIUM  8.7 (L) 12/05/2023   MG 1.9 05/31/2013    Bone No results found for: VD25OH, [id number], CI6874NY7, CI7874NY7, 25OHVITD1, 25OHVITD2, 25OHVITD3, TESTOFREE, TESTOSTERONE  Inflammation (CRP: Acute Phase) (ESR: Chronic Phase) No results found for: CRP, ESRSEDRATE, LATICACIDVEN       Note: Above Lab results reviewed.  Recent Imaging Review  CLINICAL DATA:  Provided history: Lumbar adjacent segment disease with spondylolisthesis. Status post lumbar fusion. Low back pain, prior surgery, new symptoms. Additional history provided by the scanning technologist: the patient reports having lumbar spine surgery three months ago, persistent pain.   EXAM: MRI LUMBAR SPINE WITHOUT CONTRAST   TECHNIQUE: Multiplanar, multisequence MR imaging of the lumbar spine was performed. No intravenous contrast was administered.   COMPARISON:  Lumbar spine CT 06/09/2023. Lumbar spine radiographs 05/31/2023 and 04/20/2023. Lumbar spine MRI 11/25/2022.   FINDINGS: Segmentation: 5 lumbar vertebrae. The caudal most well-formed intervertebral  disc space is designated L5-S1.   Alignment: 2 mm T12-L1 grade 1 retrolisthesis. Slight L5-S1 grade 1 anterolisthesis.   Vertebrae: Susceptibility artifact arising from a posterior spinal fusion construct spanning the L2-L5 levels, and from interbody devices at L3-L4 and L4-L5. No significant marrow edema. Large hemangioma again noted within the T11 vertebral body. L3 vertebral compression fracture  (with up to 60% height loss), chronic but new from the prior lumbar spine MRI of 11/25/2022.   Conus medullaris and cauda equina: Conus extends to the L1-L2 level. No signal abnormality identified within the visualized distal spinal cord.   Paraspinal and other soft tissues: No acute finding within included portions of the abdomen/retroperitoneum. Known pancreatic head lesion, renal cysts and left renal atrophy. Postsurgical changes to the paraspinal soft tissues.   Disc levels:   Unless otherwise stated, the level by level findings below have not significantly changed from the prior MRI of 11/25/2022.   Disc degeneration at the non-operative levels, greatest at L1-L2 (moderate-to-advanced at this level.   T10-T11: This level is imaged in the sagittal plane only. Slight disc bulge. Facet arthropathy and ligamentum flavum hypertrophy. No significant disc herniation or stenosis.   T11-T12: This level is imaged in the sagittal plane only. Slight disc bulge. Facet arthropathy. No significant spinal canal or foraminal stenosis.   T12-L1: Disc bulge. Mild facet arthropathy with ligamentum flavum hypertrophy. Small right facet joint effusion. A disc bulge mildly effaces the ventral thecal sac. No significant foraminal stenosis.   L1-L2: Disc bulge. Endplate osteophytes along the posterior and right aspects of the disc space. Facet and ligamentum flavum hypertrophy. Mild effacement of the ventral thecal sac. No significant foraminal stenosis.   L2-L3: New from the prior MRI, a posterior spinal fusion construct spans this level. Better appreciated on the same-day lumbar spine CT, there is lucency surrounding the left L2 pedicle screw and this screw extends slightly through the L2 superior endplate. Small disc bulge. Moderate facet arthropathy with ligamentum flavum hypertrophy. No significant spinal canal stenosis or neural foraminal narrowing.   L3-L4: Interval posterior  decompression and posterior spinal fusion. Disc bulge. Posterior element and ligamentum flavum hypertrophy. Mild left subarticular narrowing, improved. Mild-to-moderate central canal stenosis, improved. Mild left neural foraminal narrowing, improved.   L4-L5: Prior post decompression and posterior spinal fusion. Vertebral body and facet ankylosis. Endplate osteophytes mildly efface the ventral thecal sac. Mild osseous neural foraminal narrowing bilaterally.   L5-S1: Chronic facet ankylosis bilaterally, as well as a degree of osseous fusion across the posterior aspect of the disc space. Posterior element hypertrophy results in mild left subarticular narrowing. No significant central canal stenosis or neural foraminal narrowing.   IMPRESSION: 1. Comparison is made to the prior lumbar spine MRI of 11/25/2022. 2. 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 3. At L2-L3, there has been interval posterior fusion. Better appreciated on the same day lumbar spine CT, there is lucency surrounding the left L2 pedicle screw compatible with loosening, and this screw extends slightly through the L2 superior endplate. 4. At L3-L4, there has been interval posterior decompression and posterior spinal fusion. Multifactorial mild-to-moderate central canal stenosis, improved. Mild left subarticular stenosis, improved. Mild left neural foraminal stenosis, improved. 5. No more than mild central canal/subarticular or neural foraminal stenosis at the remaining levels. 6. 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 7. L3 vertebral compression fracture (with up to 60% height loss),  chronic but new from the prior MRI.     Electronically Signed   By: Rockey Childs D.O.   On: 06/27/2023 13:34    Note: Reviewed        Physical Exam  Vitals: BP (!) 147/72   Pulse 65    Temp (!) 95 F (35 C)   Resp 16   Ht 5' 3 (1.6 m)   Wt 180 lb (81.6 kg)   SpO2 99%   BMI 31.89 kg/m  BMI: Estimated body mass index is 31.89 kg/m as calculated from the following:   Height as of this encounter: 5' 3 (1.6 m).   Weight as of this encounter: 180 lb (81.6 kg). Ideal: Ideal body weight: 52.4 kg (115 lb 8.3 oz) Adjusted ideal body weight: 64.1 kg (141 lb 5 oz) General appearance: Well nourished, well developed, and well hydrated. In no apparent acute distress Mental status: Alert, oriented x 3 (person, place, & time)       Respiratory: No evidence of acute respiratory distress Eyes: PERLA  Lumbar Spine Area Exam  Skin & Axial Inspection: Well healed scar from previous spine surgery detected Alignment: Symmetrical Functional ROM: Pain restricted ROM       Stability: No instability detected Muscle Tone/Strength: Functionally intact. No obvious neuro-muscular anomalies detected. Sensory (Neurological): Referred pain pattern Palpation: Tender to palpation Provocative Tests: Hyperextension/rotation test: Positive for bilateral facet joint pain Lumbar quadrant test (Kemp's test): Positive for bilateral facet joint pain       Gait & Posture Assessment  Ambulation: Limited Gait: Antalgic gait (limping) Posture: WNL  Lower Extremity Exam      Side: Right lower extremity   Side: Left lower extremity  Stability: No instability observed           Stability: No instability observed          Skin & Extremity Inspection: Skin color, temperature, and hair growth are WNL. No peripheral edema or cyanosis. No masses, redness, swelling, asymmetry, or associated skin lesions. No contractures.   Skin & Extremity Inspection: Skin color, temperature, and hair growth are WNL. No peripheral edema or cyanosis. No masses, redness, swelling, asymmetry, or associated skin lesions. No contractures.  Functional ROM: Unrestricted ROM                   Functional ROM: Unrestricted ROM                   Muscle Tone/Strength: Functionally intact. No obvious neuro-muscular anomalies detected.   Muscle Tone/Strength: Functionally intact. No obvious neuro-muscular anomalies detected.  Sensory (Neurological): Unimpaired         Sensory (Neurological): Unimpaired        DTR: Patellar: deferred today Achilles: deferred today Plantar: deferred today   DTR: Patellar: deferred today Achilles: deferred today Plantar: deferred today  Palpation: No palpable anomalies   Palpation: No palpable anomalies       Assessment   Diagnosis  1. Lumbar facet arthropathy   2. Failed back surgical syndrome   3. Sacroiliac joint pain   4. Piriformis syndrome of both sides      Updated Problems: No problems updated.  Plan of Care  The patient presents with persistent lower back and buttock pain in the setting of prior L2-L5 spinal fusion. Pain is localized primarily to the lower lumbar region and buttocks.   She has a history of greater than 3 months of moderate to severe pain which is resulted in functional impairment.  The patient has tried various conservative therapeutic options such as NSAIDs, Tylenol , muscle relaxants, physical therapy which was inadequately effective.  Patient's pain is predominantly axial with physical exam and L-MRI findings suggestive of facet arthropathy.  Lumbar facet medial branch nerve blocks were discussed with the patient.  Risks and benefits were reviewed.  Patient would like to proceed with bilateral L4, L5, and S1 medial branch nerve block.  Refill of hydrocodone  as below, continue gabapentin  100 mg nightly.  PMP checked and reviewed.   Ms. MACKINZEE ROSZAK has a current medication list which includes the following long-term medication(s): albuterol , allopurinol , citalopram , ezetimibe , fluticasone , furosemide , gabapentin , glimepiride , hydralazine , labetalol , levocetirizine, losartan , nitroglycerin , pantoprazole, rosuvastatin , symbicort, zolpidem , and  pantoprazole.  Pharmacotherapy (Medications Ordered): Meds ordered this encounter  Medications   HYDROcodone -acetaminophen  (NORCO/VICODIN) 5-325 MG tablet    Sig: Take 1 tablet by mouth 3 (three) times daily as needed for severe pain (pain score 7-10). Must last 30 days    Dispense:  90 tablet    Refill:  0    Chronic Pain: STOP Act (Not applicable) Fill 1 day early if closed on refill date. Avoid benzodiazepines within 8 hours of opioids   Orders:  Orders Placed This Encounter  Procedures   LUMBAR FACET(MEDIAL BRANCH NERVE BLOCK) MBNB    Diagnosis: Lumbar Facet Syndrome (M47.816); Lumbosacral Facet Syndrome (M47.817); Lumbar Facet Joint Pain (M54.59) Medical Necessity Statement: 1.Severe chronic axial low back pain causing functional impairment documented by ongoing pain scale assessments. 2.Pain present for longer than 3 months (Chronic) documented to have failed noninvasive conservative therapies. 3.Absence of untreated radiculopathy. 4.There is no radiological evidence of untreated fractures, tumor, infection, or deformity.  Physical Examination Findings: Positive Kemp Maneuver: (Y)  Positive Lumbar Hyperextension-Rotation provocative test: (Y)    Standing Status:   Future    Expected Date:   03/07/2024    Expiration Date:   02/13/2025    Scheduling Instructions:     Procedure: Lumbar facet Block     Type: Medial Branch Block     Side: Bilateral     Purpose: Diagnostic Radiologic Mapping     Level(s): L4-5, L5-S1, by Fluoroscopic Mapping Facets ( L4, L5, S1, and TBD Medial Branch)     Sedation: Patient's choice.     Timeframe: As soon as schedule allows.    Where will this procedure be performed?:   [facility]     B/L Cluneal NB 08/24/23    Return in about 22 days (around 03/07/2024) for B/L L4, L5, S1, in clinic NS.    Recent Visits No visits were found meeting these conditions. Showing recent visits within past 90 days and meeting all other  requirements Today's Visits Date Type Provider Dept  02/14/24 Office Visit Marcelino Nurse, MD [facility]  Showing today's visits and meeting all other requirements Future Appointments Date Type Provider Dept  03/13/24 Appointment Marcelino Nurse, MD [facility]  Showing future appointments within next 90 days and meeting all other requirements  I discussed the assessment and treatment plan with the patient. The patient was provided an opportunity to ask questions and all were answered. The patient agreed with the plan and demonstrated an understanding of the instructions.  Patient advised to call back or seek an in-person evaluation if the symptoms or condition worsens.  Duration of encounter: 30 minutes.  Total time on encounter, as per AMA guidelines included both the face-to-face and non-face-to-face time personally spent by the physician and/or other qualified health  care professional(s) on the day of the encounter (includes time in activities that require the physician or other qualified health care professional and does not include time in activities normally performed by clinical staff). Physician's time may include the following activities when performed: Preparing to see the patient (e.g., pre-charting review of records, searching for previously ordered imaging, lab work, and nerve conduction tests) Review of prior analgesic pharmacotherapies. Reviewing PMP Interpreting ordered tests (e.g., lab work, imaging, nerve conduction tests) Performing post-procedure evaluations, including interpretation of diagnostic procedures Obtaining and/or reviewing separately obtained history Performing a medically appropriate examination and/or evaluation Counseling and educating the patient/family/caregiver Ordering medications, tests, or procedures Referring and communicating with other health care professionals (when not separately reported) Documenting clinical information in  the electronic or other health record Independently interpreting results (not separately reported) and communicating results to the patient/ family/caregiver Care coordination (not separately reported)  Note by: Wallie Sherry, MD (TTS and AI technology used. I apologize for any typographical errors that were not detected and corrected.) Date: 02/14/2024; Time: 9:55 AM

## 2024-02-16 ENCOUNTER — Telehealth: Admitting: Student in an Organized Health Care Education/Training Program

## 2024-02-16 ENCOUNTER — Other Ambulatory Visit: Payer: Self-pay | Admitting: Internal Medicine

## 2024-02-16 DIAGNOSIS — J4520 Mild intermittent asthma, uncomplicated: Secondary | ICD-10-CM

## 2024-02-18 ENCOUNTER — Other Ambulatory Visit: Payer: Self-pay | Admitting: Internal Medicine

## 2024-02-18 DIAGNOSIS — E782 Mixed hyperlipidemia: Secondary | ICD-10-CM

## 2024-02-24 ENCOUNTER — Ambulatory Visit: Admitting: Internal Medicine

## 2024-02-25 ENCOUNTER — Other Ambulatory Visit: Payer: Self-pay | Admitting: Family

## 2024-02-25 DIAGNOSIS — F5101 Primary insomnia: Secondary | ICD-10-CM

## 2024-02-27 ENCOUNTER — Telehealth: Payer: Self-pay | Admitting: *Deleted

## 2024-02-27 ENCOUNTER — Encounter: Payer: Self-pay | Admitting: *Deleted

## 2024-02-27 NOTE — Patient Instructions (Signed)
 Franci N Standifer - I am sorry I was unable to reach you today for our scheduled appointment. I work with Fernand Fredy RAMAN, MD and am calling to support your healthcare needs. Please contact me at [phone] at your earliest convenience. I look forward to speaking with you soon.   Thank you,  Suzen L. Ramonita, RN, BSN, CCM [place]  [facility], [facility] RN Care Manager Direct Dial: [phone]  Fax: [phone]

## 2024-02-28 ENCOUNTER — Telehealth: Payer: Self-pay

## 2024-02-28 NOTE — Progress Notes (Signed)
 Complex Care Management Care Guide Note  02/28/2024 Name: [name] MRN: [id number] DOB: 01-26-1946  [name] is a 78 y.o. year old female who is a primary care patient of Fernand Fredy RAMAN, MD and is actively engaged with the care management team. I reached out to [employer] by phone today to assist with re-scheduling  with the RN Case Manager.  Follow up plan: Unsuccessful telephone outreach attempt made. A HIPAA compliant phone message was left for the patient providing contact information and requesting a return call.  Leotis Rase [facility], [facility] Guide  Direct Dial: [phone]  Fax [phone]

## 2024-02-29 ENCOUNTER — Other Ambulatory Visit: Payer: Self-pay | Admitting: Internal Medicine

## 2024-02-29 DIAGNOSIS — R933 Abnormal findings on diagnostic imaging of other parts of digestive tract: Secondary | ICD-10-CM | POA: Diagnosis not present

## 2024-02-29 DIAGNOSIS — K862 Cyst of pancreas: Secondary | ICD-10-CM | POA: Diagnosis not present

## 2024-02-29 DIAGNOSIS — F411 Generalized anxiety disorder: Secondary | ICD-10-CM

## 2024-02-29 DIAGNOSIS — I2510 Atherosclerotic heart disease of native coronary artery without angina pectoris: Secondary | ICD-10-CM | POA: Diagnosis not present

## 2024-03-02 ENCOUNTER — Other Ambulatory Visit: Payer: Self-pay | Admitting: Internal Medicine

## 2024-03-06 ENCOUNTER — Encounter: Payer: Self-pay | Admitting: Internal Medicine

## 2024-03-06 ENCOUNTER — Ambulatory Visit (INDEPENDENT_AMBULATORY_CARE_PROVIDER_SITE_OTHER): Admitting: Internal Medicine

## 2024-03-06 ENCOUNTER — Ambulatory Visit: Payer: Self-pay | Admitting: Internal Medicine

## 2024-03-06 VITALS — BP 156/94 | HR 73 | Ht 63.0 in | Wt 180.6 lb

## 2024-03-06 DIAGNOSIS — R0602 Shortness of breath: Secondary | ICD-10-CM

## 2024-03-06 DIAGNOSIS — E782 Mixed hyperlipidemia: Secondary | ICD-10-CM | POA: Diagnosis not present

## 2024-03-06 DIAGNOSIS — G894 Chronic pain syndrome: Secondary | ICD-10-CM | POA: Diagnosis not present

## 2024-03-06 DIAGNOSIS — E1169 Type 2 diabetes mellitus with other specified complication: Secondary | ICD-10-CM

## 2024-03-06 DIAGNOSIS — M3214 Glomerular disease in systemic lupus erythematosus: Secondary | ICD-10-CM

## 2024-03-06 DIAGNOSIS — I152 Hypertension secondary to endocrine disorders: Secondary | ICD-10-CM

## 2024-03-06 DIAGNOSIS — N184 Chronic kidney disease, stage 4 (severe): Secondary | ICD-10-CM | POA: Diagnosis not present

## 2024-03-06 DIAGNOSIS — E1159 Type 2 diabetes mellitus with other circulatory complications: Secondary | ICD-10-CM

## 2024-03-06 DIAGNOSIS — R110 Nausea: Secondary | ICD-10-CM | POA: Insufficient documentation

## 2024-03-06 DIAGNOSIS — E1165 Type 2 diabetes mellitus with hyperglycemia: Secondary | ICD-10-CM

## 2024-03-06 LAB — POCT CBG (FASTING - GLUCOSE)-MANUAL ENTRY: Glucose Fasting, POC: 320 mg/dL — AB (ref 70–99)

## 2024-03-06 MED ORDER — ONDANSETRON HCL 4 MG PO TABS: 4.0000 mg | ORAL_TABLET | Freq: Three times a day (TID) | ORAL | 0 refills | Status: AC | PRN

## 2024-03-06 NOTE — Progress Notes (Signed)
 Established Patient Office Visit  Subjective:  Patient ID: [name], female    DOB: [birth year]  Age: 78 y.o. MRN: [id number]  Chief Complaint  Patient presents with   Nausea    Patient was here today for complaints of feeling nauseous, irregular bowel movements, and bloated after eating. Will order Zofran  4 mg for nausea. Encouraged patient to start taking probiotic to see if her abdominal discomfort improves. Symptoms could be related to worsening CKD. States she feels like the food is getting stuck in her throat. Also endorses feeling more fatigued, shortness of breath, cough, BLE edema. Patient has CKD stage 4 and has seen Nephrology in the past. She recently wanted a second opinion and is scheduled to see new Nephrologist 03/13/24 before agreeing to do dialysis.  Patient endorses the need for electric scooter to improve independence. She states her exercise tolerance continues to decline and she has difficulty walking from handicap parking spots to inside stores and has to stop multiple times due to shortness of breath. Will submit documentation to insurance company for electric  mobility scooter.  Since last visit patient saw surgeon for pancreatic cyst. He stated that it did not appear to be cancerous but did recommend EUS and sent referral to GI.  Patient was prescribed Celexa  to begin taking at her last appointment. Patient could not remember why it was prescribed and endorses she never started taking it. Patient notified to not start taking it at this time. Will collect routine fasting labs today.    No other concerns at this time.   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 b.)07/18/2006 -endovas oblit complex ACOM aneur. c.) Endovas Tx of 6.8x100mm RMCA aneur. d.) 3.5x63mm remnant RMCA aneur 2/2 coil compaction. e.) Interval 3.2x3.15mm  saccular outpouching in ACOM c/w mild recannulization in neck. 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 g.) 3.7x70mm remnant of prev Tx'd ACOM aneur -failed embol 02/24/2012.   CKD (chronic kidney disease), stage IV (HCC)    a.) solitary functioning kidney on the RIGHT   Complication of anesthesia    a.) delayed emergence   Coronary artery disease 02/23/2007   a.) LHC 02/23/2007 --> EF 50%; 30% pLAD, 70% mRCA --> planned for staged PCI. b.) PCI 02/27/2007: EF 60%; 3.5 x 15 mm Vision BMS to 80% mRCA. b.) LHC 07/24/2007: EF 60%; minor irregs; no occlusive CAD; no intervention. c.) LHC 10/28/2010: EF 60%; 30% mLAD, LCx with minor luminal irregs, 20% ISR mRCA; no intervention. d.) LHC 05/31/2013: EF 60%; 40% mLAD, 30% ISR m-dRCA; no interventions.   DDD (degenerative disc disease), cervical    a.) s/p ACDF C5-C7; hardware in neck; patient appreciates stiffness and issues with mobility   Diastolic dysfunction    a.)  TTE 07/07/2021: EF 87.8%; normal PASP; trace TR/MR; G1DD.   Elevated LEFT hemidiaphragm    Gout    Headache(784.0)    Heart murmur    HLD (hyperlipidemia)    HOH (hard of hearing)    Has hearing aids, doesn't wear   Hyperkalemia    Hyperparathyroidism due to renal insufficiency (HCC)    Hypertension    Incomplete right bundle branch block (RBBB)    Insomnia    a.) takes zolpidem    Long term (current) use of immunomodulator    a.) on DMARD therapy (hydroxychloroquine ) for RA/SLE Dx.  Lumbar adjacent segment disease with spondylolisthesis    Multiple acquired cysts of kidney    On chronic clopidogrel  therapy    OSA (obstructive sleep apnea)    a.) does NOT use nocturnal pap therapy   Osteoarthritis    Pancreatic cyst    Peripheral vascular disease (HCC)    Pneumonia    Post-COVID chronic cough    Pulmonary nodule    RBBB (right bundle branch block)    Rectal bleeding    Rheumatoid arthritis (HCC)    a.) on DMARD; hydroxychloriquine   Shortness of breath  03/01/2023   Systemic lupus erythematosus (HCC)    T2DM (type 2 diabetes mellitus) (HCC)    Tendinitis of left wrist    Thickened endometrium    Thickened endometrium    Wears dentures    partial lower    Past Surgical History:  Procedure Laterality Date   ANEURYSM COILING  08/2013   ANTERIOR LATERAL LUMBAR FUSION WITH PERCUTANEOUS SCREW 1 LEVEL N/A 03/09/2023   Procedure: L3-4 LATERAL LUMBAR INTERBODY FUSION;  Surgeon: Clois Fret, MD;  Location: [facility];  Service: Neurosurgery;  Laterality: N/A;   APPLICATION OF INTRAOPERATIVE CT SCAN N/A 03/09/2023   Procedure: APPLICATION OF INTRAOPERATIVE CT SCAN;  Surgeon: Clois Fret, MD;  Location: [facility];  Service: Neurosurgery;  Laterality: N/A;   Attempted embolization of previously treated ACOM aneurysm; unsuccessful N/A 01/28/2012   Location: [facility]   BACK SURGERY     BARTHOLIN CYST MARSUPIALIZATION     BICEPT TENODESIS Right 03/02/2022   Procedure: BICEPS TENODESIS;  Surgeon: Edie Norleen PARAS, MD;  Location: [facility];  Service: Orthopedics;  Laterality: Right;   BREAST CYST ASPIRATION Right 01/25/2012   FNA neg.   BREAST EXCISIONAL BIOPSY Left 06/26/2007   neg   BUNIONECTOMY Bilateral 2022   CARDIAC CATHETERIZATION Left 02/23/2007   Procedure: CARDIAC CATHETERIZATION; Location: [facility]; Surgeon: Denyse Bathe, MD   CARDIAC CATHETERIZATION Left 07/31/2007   Procedure: CARDIAC CATHETERIZATION; Location: [facility]; Surgeon: Denyse Bathe, MD   CARDIAC CATHETERIZATION Left 10/28/2010   Procedure: CARDIAC CATHETERIZATION; Location: [facility]; Surgeon: Denyse Bathe, MD   CARDIAC CATHETERIZATION Left 05/31/2013   Procedure: CARDIAC CATHETERIZATION; Location: [facility]; Surgeon: Denyse Bathe, MD   Carotid arteriogram; 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 N/A 07/18/2006   Location: [facility]   CATARACT EXTRACTION     CERVICAL FUSION N/A    Procedure: ACDF C5-C7   COLONOSCOPY WITH  PROPOFOL  N/A 10/04/2019   Procedure: COLONOSCOPY WITH BIOPSY;  Surgeon: Unk Corinn Skiff, MD;  Location: [facility];  Service: Endoscopy;  Laterality: N/A;  Diabetic (borderline) - oral meds priority 3   CORONARY STENT INTERVENTION Left 02/27/2007   Procedure: CORONARY STENT INTERVENTION (3.5 x 15 mm Vision BMS to mRCA); Location: [facility]; Surgeon: Deatrice Cage, MD   Endovascular near complete obliteration of enlarging neck remnant of previously treated RIGHT MCA aneurysm using stent assisted coiling N/A 09/03/2010   Location: [facility]   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 N/A 10/03/2006   Location: [facility]   HYSTEROSCOPY WITH D & C N/A 08/04/2021   Procedure: DILATATION AND CURETTAGE /HYSTEROSCOPY;  Surgeon: Victor Claudell SAUNDERS, MD;  Location: [facility];  Service: Gynecology;  Laterality: N/A;   POLYPECTOMY N/A 10/04/2019   Procedure: POLYPECTOMY;  Surgeon: Unk Corinn Skiff, MD;  Location: [facility];  Service: Endoscopy;  Laterality: N/A;   REVERSE SHOULDER ARTHROPLASTY Right 03/02/2022   Procedure: REVERSE SHOULDER ARTHROPLASTY;  Surgeon: Edie Norleen PARAS, MD;  Location: [facility];  Service: Orthopedics;  Laterality: Right;   UPPER ESOPHAGEAL ENDOSCOPIC ULTRASOUND (EUS) N/A 04/15/2016   Procedure: UPPER ESOPHAGEAL ENDOSCOPIC ULTRASOUND (EUS);  Surgeon: Asberry DELENA Coffee, MD;  Location: [facility];  Service: Gastroenterology;  Laterality: N/A;    Social History   Socioeconomic History   Marital status: Widowed    Spouse name: Not on file   Number of children: Not on file   Years of education: Not on file   Highest education level: Not on file  Occupational History   Not on file  Tobacco Use   Smoking status: Never    Passive exposure: Never   Smokeless tobacco: Never  Vaping Use   Vaping status: Never Used  Substance and Sexual Activity   Alcohol use: Yes    Comment: Occasionally at christmas    Drug use: No   Sexual activity: Not Currently    Birth control/protection: Post-menopausal  Other Topics Concern   Not on file  Social History Narrative   Lives with daughter   Social Drivers of Health   Financial Resource Strain: Low Risk  (02/01/2024)   Overall Financial Resource Strain (CARDIA)    Difficulty of Paying Living Expenses: Not hard at all  Food Insecurity: No Food Insecurity (02/01/2024)   Hunger Vital Sign    Worried About Running Out of Food in the Last Year: Never true    Ran Out of Food in the Last Year: Never true  Transportation Needs: No Transportation Needs (02/01/2024)   PRAPARE - Administrator, Civil Service (Medical): No    Lack of Transportation (Non-Medical): No  Physical Activity: Insufficiently Active (02/01/2024)   Exercise Vital Sign    Days of Exercise per Week: 2 days    Minutes of Exercise per Session: 30 min  Stress: No Stress Concern Present (10/11/2023)   [employer] of Occupational Health - Occupational Stress Questionnaire    Feeling of Stress : Not at all  Social Connections: Moderately Isolated (10/11/2023)   Social Connection and Isolation Panel    Frequency of Communication with Friends and Family: More than three times a week    Frequency of Social Gatherings with Friends and Family: More than three times a week    Attends Religious Services: Never    Database administrator or Organizations: Yes    Attends Banker Meetings: Never    Marital Status: Widowed  Intimate Partner Violence: Not At Risk (02/01/2024)   Humiliation, Afraid, Rape, and Kick questionnaire    Fear of Current or Ex-Partner: No    Emotionally Abused: No    Physically Abused: No    Sexually Abused: No    Family History  Problem Relation Age of Onset   Breast cancer Paternal Aunt 78   Breast cancer Paternal Aunt    Heart disease Mother    Heart disease Father    Stroke Father    Diabetes Sister    Diabetes Brother     Allergies   Allergen Reactions   Isosorbide Dinitrate Other (See Comments)    Collapse   Amlodipine Swelling   Sulfa Antibiotics Itching   Tizanidine Hcl    Oxycodone  Itching   Sulfasalazine Itching    Outpatient Medications Prior to Visit  Medication Sig   albuterol  (VENTOLIN  HFA) 108 (90 Base) MCG/ACT inhaler Inhale 2 puffs into the lungs every 6 (six) hours as needed for wheezing or shortness of breath.   allopurinol  (  ZYLOPRIM ) 100 MG tablet TAKE 1 TABLET BY MOUTH EVERY DAY   aspirin  EC 81 MG tablet Take 1 tablet (81 mg total) by mouth daily. Swallow whole.   calcitRIOL (ROCALTROL) 0.25 MCG capsule Take 0.25 mcg by mouth.   citalopram  (CELEXA ) 10 MG tablet TAKE 1 TABLET BY MOUTH EVERY DAY   clotrimazole -betamethasone (LOTRISONE) cream APPLY TWICE A DAY TO GROIN AREA SKIN FOR 1 WEEK AND THEN AS NEEDED.   Cyanocobalamin (B-12 PO) Take by mouth.   ezetimibe  (ZETIA ) 10 MG tablet Take 1 tablet (10 mg total) by mouth daily.   fluticasone  (FLONASE ) 50 MCG/ACT nasal spray Place 2 sprays into both nostrils daily. SPRAY 2 SPRAYS INTO EACH NOSTRIL EVERY DAY   furosemide  (LASIX ) 40 MG tablet TAKE 1 TABLET BY MOUTH EVERY DAY   gabapentin  (NEURONTIN ) 100 MG capsule Take 1-3 capsules (100-300 mg total) by mouth at bedtime.   glimepiride  (AMARYL ) 2 MG tablet TAKE 1 TABLET (2 MG TOTAL) BY MOUTH SEE ADMIN INSTRUCTIONS. TAKE 1 MG DAILY, MAY INCREASE TO 2 MG IF BLOOD SUGAR IS OVER 175   hydrALAZINE  (APRESOLINE ) 100 MG tablet Take 1 tablet (100 mg total) by mouth 2 (two) times daily.   HYDROcodone -acetaminophen  (NORCO/VICODIN) 5-325 MG tablet Take 1 tablet by mouth 3 (three) times daily as needed for severe pain (pain score 7-10). Must last 30 days   hydroxychloroquine  (PLAQUENIL ) 200 MG tablet Take 200 mg by mouth daily.   labetalol  (NORMODYNE ) 300 MG tablet TAKE 1 TABLET BY MOUTH TWICE A DAY   levocetirizine (XYZAL ) 5 MG tablet TAKE 1 TABLET BY MOUTH EVERY DAY IN THE EVENING   losartan  (COZAAR ) 100 MG tablet TAKE  1 TABLET BY MOUTH EVERY DAY IN THE MORNING   methocarbamol  (ROBAXIN ) 500 MG tablet TAKE 1 TABLET BY MOUTH EVERY 6 HOURS AS NEEDED FOR MUSCLE SPASMS.   nitroGLYCERIN  (NITROSTAT ) 0.6 MG SL tablet TAKE 1 TABLET BY MOUTH AS NEEDED FOR CP. WAIT 5 MINUTES BEFORE NEXT DOSE. PROCEED TO ER IF NO RELIEF AFTER 3 DOSES   pantoprazole (PROTONIX) 40 MG tablet Take 40 mg by mouth.   pantoprazole (PROTONIX) 40 MG tablet TAKE 1 TABLET BY MOUTH EVERY DAY   rosuvastatin  (CRESTOR ) 40 MG tablet TAKE 1 TABLET BY MOUTH EVERY DAY   senna (SENOKOT) 8.6 MG TABS tablet Take 1 tablet (8.6 mg total) by mouth daily as needed for mild constipation.   SYMBICORT 80-4.5 MCG/ACT inhaler INHALE 1 PUFF BY MOUTH TWICE A DAY   traMADol  (ULTRAM ) 50 MG tablet Take 1 tablet (50 mg total) by mouth every 6 (six) hours as needed for moderate pain (pain score 4-6).   tretinoin (RETIN-A) 0.05 % cream APPLY TO AFFECTED AREA EVERY DAY AT BEDTIME   zolpidem  (AMBIEN ) 5 MG tablet TAKE 1 TABLET BY MOUTH AT BEDTIME AS NEEDED FOR SLEEP   No facility-administered medications prior to visit.    Review of Systems  Constitutional:  Positive for malaise/fatigue. Negative for chills and fever.  HENT: Negative.  Negative for congestion and sore throat.   Eyes: Negative.  Negative for blurred vision and pain.  Respiratory:  Positive for cough and shortness of breath.   Cardiovascular:  Positive for leg swelling. Negative for chest pain and palpitations.  Gastrointestinal:  Positive for nausea. Negative for abdominal pain, blood in stool, constipation, diarrhea, heartburn, melena and vomiting.  Genitourinary: Negative.  Negative for dysuria, flank pain, frequency and urgency.  Musculoskeletal:  Positive for back pain and joint pain. Negative for myalgias.  Skin:  Negative.   Neurological: Negative.  Negative for dizziness, tingling, sensory change, weakness and headaches.  Endo/Heme/Allergies: Negative.   Psychiatric/Behavioral: Negative.  Negative for  depression and suicidal ideas. The patient is not nervous/anxious.        Objective:   BP (!) 156/94   Pulse 73   Ht 5' 3 (1.6 m)   Wt 180 lb 9.6 oz (81.9 kg)   SpO2 96%   BMI 31.99 kg/m   Vitals:   03/06/24 1407  BP: (!) 156/94  Pulse: 73  Height: 5' 3 (1.6 m)  Weight: 180 lb 9.6 oz (81.9 kg)  SpO2: 96%  BMI (Calculated): 32    Physical Exam Vitals and nursing note reviewed.  Constitutional:      Appearance: Normal appearance.  HENT:     Head: Normocephalic and atraumatic.     Nose: Nose normal.     Mouth/Throat:     Mouth: Mucous membranes are moist.     Pharynx: Oropharynx is clear.  Eyes:     Conjunctiva/sclera: Conjunctivae normal.     Pupils: Pupils are equal, round, and reactive to light.  Cardiovascular:     Rate and Rhythm: Normal rate and regular rhythm.     Pulses: Normal pulses.     Heart sounds: Normal heart sounds. No murmur heard. Pulmonary:     Effort: Pulmonary effort is normal.     Breath sounds: Normal breath sounds. No wheezing.  Abdominal:     General: Bowel sounds are normal. There is distension (soft, nontender).     Palpations: Abdomen is soft.     Tenderness: There is no abdominal tenderness. There is no right CVA tenderness or left CVA tenderness.  Musculoskeletal:        General: Normal range of motion.     Cervical back: Normal range of motion.     Right lower leg: No edema.     Left lower leg: No edema.  Skin:    General: Skin is warm and dry.  Neurological:     General: No focal deficit present.     Mental Status: She is alert and oriented to person, place, and time.  Psychiatric:        Mood and Affect: Mood normal.        Behavior: Behavior normal.      Results for orders placed or performed in visit on 03/06/24  POCT CBG (Fasting - Glucose)  Result Value Ref Range   Glucose Fasting, POC 320 (A) 70 - 99 mg/dL    Recent Results (from the past 2160 hours)  Allergen Profile, Mold     Status: None   Collection  Time: 01/17/24  2:27 PM  Result Value Ref Range   Class Description Allergens Comment     Comment:     Levels of Specific IgE       Class  Description of Class     ---------------------------  -----  --------------------                    < 0.10         0         Negative            0.10 -    0.31         0/I       Equivocal/Low            0.32 -    0.55  I         Low            0.56 -    1.40         II        Moderate            1.41 -    3.90         III       High            3.91 -   19.00         IV        Very High           19.01 -  100.00         V         Very High                   >100.00         VI        Very High    Penicillium Chrysogen IgE <0.10 Class 0 kU/L   Cladosporium Herbarum IgE <0.10 Class 0 kU/L   Aspergillus Fumigatus IgE <0.10 Class 0 kU/L   Mucor Racemosus IgE <0.10 Class 0 kU/L   Candida Albicans IgE <0.10 Class 0 kU/L   Alternaria Alternata IgE <0.10 Class 0 kU/L   Setomelanomma Rostrat <0.10 Class 0 kU/L   M009-IgE Fusarium proliferatum <0.10 Class 0 kU/L   Stemphylium Herbarum IgE <0.10 Class 0 kU/L   Aureobasidi Pullulans IgE <0.10 Class 0 kU/L   Phoma Betae IgE <0.10 Class 0 kU/L   M014-IgE Epicoccum purpur <0.10 Class 0 kU/L  ECHOCARDIOGRAM COMPLETE     Status: None   Collection Time: 01/30/24  4:44 PM  Result Value Ref Range   AR max vel 2.13 cm2   AV Peak grad 10.8 mmHg   Ao pk vel 1.64 m/s   S' Lateral 4.51 cm   Area-P 1/2 2.60 cm2   AV Area VTI 2.06 cm2   AV Mean grad 6.0 mmHg   AV Area mean vel 1.99 cm2   Est EF 55 - 60%   POCT CBG (Fasting - Glucose)     Status: Abnormal   Collection Time: 03/06/24  2:13 PM  Result Value Ref Range   Glucose Fasting, POC 320 (A) 70 - 99 mg/dL      Assessment & Plan:  Start taking zofran  4 mg. Continue taking other medications as prescribed. Do not start taking Celexa  at this time. Keep upcoming Nephrology appointment and appointments with other specialists as recommended. Problem List  Items Addressed This Visit     Systemic lupus erythematosus (HCC)   Relevant Orders   SLE Profile C   Shortness of breath   Type 2 diabetes mellitus with hyperglycemia, without long-term current use of insulin  (HCC) - Primary   Relevant Orders   POCT CBG (Fasting - Glucose) (Completed)   Hemoglobin A1c   Hypertension associated with diabetes (HCC)   Relevant Orders   CBC with Diff   CMP14+EGFR   Chronic pain syndrome   Combined hyperlipidemia associated with type 2 diabetes mellitus (HCC)   Relevant Orders   CBC with Diff   CMP14+EGFR   Lipid Panel w/o Chol/HDL Ratio   Chronic kidney disease, stage IV (severe) (HCC)   Relevant Orders   CBC with Diff   CMP14+EGFR   Microalbumin / Creatinine Urine Ratio   Nausea   Relevant Medications  ondansetron  (ZOFRAN ) 4 MG tablet    Return in about 2 weeks (around 03/20/2024).   Total time spent: 30 minutes  FERNAND FREDY RAMAN, MD  03/06/2024   This document may have been prepared by [facility] Voice Recognition software and as such may include unintentional dictation errors.

## 2024-03-07 ENCOUNTER — Telehealth: Payer: Self-pay | Admitting: *Deleted

## 2024-03-07 ENCOUNTER — Encounter: Payer: Self-pay | Admitting: *Deleted

## 2024-03-07 ENCOUNTER — Ambulatory Visit
Admission: RE | Admit: 2024-03-07 | Discharge: 2024-03-07 | Disposition: A | Discharge status: Home / Self Care | Source: Ambulatory Visit | Attending: Student in an Organized Health Care Education/Training Program | Admitting: Student in an Organized Health Care Education/Training Program

## 2024-03-07 ENCOUNTER — Encounter: Payer: Self-pay | Admitting: Student in an Organized Health Care Education/Training Program

## 2024-03-07 ENCOUNTER — Ambulatory Visit (HOSPITAL_BASED_OUTPATIENT_CLINIC_OR_DEPARTMENT_OTHER): Admitting: Student in an Organized Health Care Education/Training Program

## 2024-03-07 VITALS — BP 153/78 | HR 69 | Temp 97.8°F | Resp 15 | Ht 63.0 in | Wt 180.0 lb

## 2024-03-07 DIAGNOSIS — G894 Chronic pain syndrome: Secondary | ICD-10-CM | POA: Diagnosis present

## 2024-03-07 DIAGNOSIS — M47816 Spondylosis without myelopathy or radiculopathy, lumbar region: Secondary | ICD-10-CM

## 2024-03-07 DIAGNOSIS — M961 Postlaminectomy syndrome, not elsewhere classified: Secondary | ICD-10-CM | POA: Insufficient documentation

## 2024-03-07 LAB — CBC WITH DIFFERENTIAL/PLATELET
Basophils Absolute: 0.1 x10E3/uL (ref 0.0–0.2)
Basos: 1 %
EOS (ABSOLUTE): 0.2 x10E3/uL (ref 0.0–0.4)
Eos: 2 %
Hematocrit: 45 % (ref 34.0–46.6)
Hemoglobin: 14.5 g/dL (ref 11.1–15.9)
Immature Grans (Abs): 0 x10E3/uL (ref 0.0–0.1)
Immature Granulocytes: 0 %
Lymphocytes Absolute: 2.4 x10E3/uL (ref 0.7–3.1)
Lymphs: 30 %
MCH: 26.7 pg (ref 26.6–33.0)
MCHC: 32.2 g/dL (ref 31.5–35.7)
MCV: 83 fL (ref 79–97)
Monocytes Absolute: 0.8 x10E3/uL (ref 0.1–0.9)
Monocytes: 10 %
Neutrophils Absolute: 4.6 x10E3/uL (ref 1.4–7.0)
Neutrophils: 57 %
Platelets: 370 x10E3/uL (ref 150–450)
RBC: 5.43 x10E6/uL — ABNORMAL HIGH (ref 3.77–5.28)
RDW: 15.2 % (ref 11.7–15.4)
WBC: 8 x10E3/uL (ref 3.4–10.8)

## 2024-03-07 LAB — CMP14+EGFR
ALT: 11 IU/L (ref 0–32)
AST: 15 IU/L (ref 0–40)
Albumin: 4.5 g/dL (ref 3.8–4.8)
Alkaline Phosphatase: 75 IU/L (ref 49–135)
BUN/Creatinine Ratio: 6 — ABNORMAL LOW (ref 12–28)
BUN: 7 mg/dL — ABNORMAL LOW (ref 8–27)
Bilirubin Total: 0.6 mg/dL (ref 0.0–1.2)
CO2: 24 mmol/L (ref 20–29)
Calcium: 9.7 mg/dL (ref 8.7–10.3)
Chloride: 102 mmol/L (ref 96–106)
Creatinine, Ser: 1.16 mg/dL — ABNORMAL HIGH (ref 0.57–1.00)
Globulin, Total: 2.5 g/dL (ref 1.5–4.5)
Glucose: 76 mg/dL (ref 70–99)
Potassium: 3.8 mmol/L (ref 3.5–5.2)
Sodium: 138 mmol/L (ref 134–144)
Total Protein: 7 g/dL (ref 6.0–8.5)
eGFR: 49 mL/min/1.73 — ABNORMAL LOW (ref >59)

## 2024-03-07 LAB — LIPID PANEL W/O CHOL/HDL RATIO
Cholesterol, Total: 131 mg/dL (ref 100–199)
HDL: 60 mg/dL (ref >39)
LDL Chol Calc (NIH): 63 mg/dL (ref 0–99)
Triglycerides: 30 mg/dL (ref 0–149)
VLDL Cholesterol Cal: 8 mg/dL (ref 5–40)

## 2024-03-07 LAB — HEMOGLOBIN A1C
Est. average glucose Bld gHb Est-mCnc: 123 mg/dL
Hgb A1c MFr Bld: 5.9 % — ABNORMAL HIGH (ref 4.8–5.6)

## 2024-03-07 MED ORDER — LIDOCAINE HCL 2 % IJ SOLN
20.0000 mL | Freq: Once | INTRAMUSCULAR | Status: AC
  Administered 2024-03-07: 400 mg
  Filled 2024-03-07: qty 40

## 2024-03-07 MED ORDER — DEXAMETHASONE SODIUM PHOSPHATE 10 MG/ML IJ SOLN
20.0000 mg | Freq: Once | INTRAMUSCULAR | Status: AC
  Administered 2024-03-07: 20 mg
  Filled 2024-03-07: qty 2

## 2024-03-07 MED ORDER — ROPIVACAINE HCL 2 MG/ML IJ SOLN
18.0000 mL | Freq: Once | INTRAMUSCULAR | Status: AC
  Administered 2024-03-07: 18 mL via PERINEURAL
  Filled 2024-03-07: qty 20

## 2024-03-07 NOTE — Progress Notes (Signed)
 Safety precautions to be maintained throughout the outpatient stay will include: orient to surroundings, keep bed in low position, maintain call bell within reach at all times, provide assistance with transfer out of bed and ambulation.

## 2024-03-07 NOTE — Patient Instructions (Signed)
 ____________________________________________________________________________________________  Post-Procedure Discharge Instructions  Instructions: Apply ice:  Purpose: This will minimize any swelling and discomfort after procedure.  When: Day of procedure, as soon as you get home. How: Fill a plastic sandwich bag with crushed ice. Cover it with a small towel and apply to injection site. How long: (15 min on, 15 min off) Apply for 15 minutes then remove x 15 minutes.  Repeat sequence on day of procedure, until you go to bed. Apply heat:  Purpose: To treat any soreness and discomfort from the procedure. When: Starting the next day after the procedure. How: Apply heat to procedure site starting the day following the procedure. How long: May continue to repeat daily, until discomfort goes away. Food intake: Start with clear liquids (like water) and advance to regular food, as tolerated.  Physical activities: Keep activities to a minimum for the first 8 hours after the procedure. After that, then as tolerated. Driving: If you have received any sedation, be responsible and do not drive. You are not allowed to drive for 24 hours after having sedation. Blood thinner: (Applies only to those taking blood thinners) You may restart your blood thinner 6 hours after your procedure. Insulin: (Applies only to Diabetic patients taking insulin) As soon as you can eat, you may resume your normal dosing schedule. Infection prevention: Keep procedure site clean and dry. Shower daily and clean area with soap and water. Post-procedure Pain Diary: Extremely important that this be done correctly and accurately. Recorded information will be used to determine the next step in treatment. For the purpose of accuracy, follow these rules: Evaluate only the area treated. Do not report or include pain from an untreated area. For the purpose of this evaluation, ignore all other areas of pain, except for the treated area. After  your procedure, avoid taking a long nap and attempting to complete the pain diary after you wake up. Instead, set your alarm clock to go off every hour, on the hour, for the initial 8 hours after the procedure. Document the duration of the numbing medicine, and the relief you are getting from it. Do not go to sleep and attempt to complete it later. It will not be accurate. If you received sedation, it is likely that you were given a medication that may cause amnesia. Because of this, completing the diary at a later time may cause the information to be inaccurate. This information is needed to plan your care. Follow-up appointment: Keep your post-procedure follow-up evaluation appointment after the procedure (usually 2 weeks for most procedures, 6 weeks for radiofrequencies). DO NOT FORGET to bring you pain diary with you.   Expect: (What should I expect to see with my procedure?) From numbing medicine (AKA: Local Anesthetics): Numbness or decrease in pain. You may also experience some weakness, which if present, could last for the duration of the local anesthetic. Onset: Full effect within 15 minutes of injected. Duration: It will depend on the type of local anesthetic used. On the average, 1 to 8 hours.  From steroids (Applies only if steroids were used): Decrease in swelling or inflammation. Once inflammation is improved, relief of the pain will follow. Onset of benefits: Depends on the amount of swelling present. The more swelling, the longer it will take for the benefits to be seen. In some cases, up to 10 days. Duration: Steroids will stay in the system x 2 weeks. Duration of benefits will depend on multiple posibilities including persistent irritating factors. Side-effects: If present, they  may typically last 2 weeks (the duration of the steroids). Frequent: Cramps (if they occur, drink Gatorade and take over-the-counter Magnesium 450-500 mg once to twice a day); water retention with temporary  weight gain; increases in blood sugar; decreased immune system response; increased appetite. Occasional: Facial flushing (red, warm cheeks); mood swings; menstrual changes. Uncommon: Long-term decrease or suppression of natural hormones; bone thinning. (These are more common with higher doses or more frequent use. This is why we prefer that our patients avoid having any injection therapies in other practices.)  Very Rare: Severe mood changes; psychosis; aseptic necrosis. From procedure: Some discomfort is to be expected once the numbing medicine wears off. This should be minimal if ice and heat are applied as instructed.  Call if: (When should I call?) You experience numbness and weakness that gets worse with time, as opposed to wearing off. New onset bowel or bladder incontinence. (Applies only to procedures done in the spine)  Emergency Numbers: Durning business hours (Monday - Thursday, 8:00 AM - 4:00 PM) (Friday, 9:00 AM - 12:00 Noon): [phone] After hours: [phone] NOTE: If you are having a problem and are unable connect with, or to talk to a provider, then go to your nearest urgent care or emergency department. If the problem is serious and urgent, please call 911. ____________________________________________________________________________________________  Facet Blocks Patient Information  Description: The facets are joints in the spine between the vertebrae.  Like any joints in the body, facets can become irritated and painful.  Arthritis can also effect the facets.  By injecting steroids and local anesthetic in and around these joints, we can temporarily block the nerve supply to them.  Steroids act directly on irritated nerves and tissues to reduce selling and inflammation which often leads to decreased pain.  Facet blocks may be done anywhere along the spine from the neck to the low back depending upon the location of your pain.   After numbing the skin with local anesthetic  (like Novocaine), a small needle is passed onto the facet joints under x-ray guidance.  You may experience a sensation of pressure while this is being done.  The entire block usually lasts about 15-25 minutes.   Conditions which may be treated by facet blocks:  Low back/buttock pain Neck/shoulder pain Certain types of headaches  Preparation for the injection:  Do not eat any solid food or dairy products within 8 hours of your appointment. You may drink clear liquid up to 3 hours before appointment.  Clear liquids include water, black coffee, juice or soda.  No milk or cream please. You may take your regular medication, including pain medications, with a sip of water before your appointment.  Diabetics should hold regular insulin (if taken separately) and take 1/2 normal NPH dose the morning of the procedure.  Carry some sugar containing items with you to your appointment. A driver must accompany you and be prepared to drive you home after your procedure. Bring all your current medications with you. An IV may be inserted and sedation may be given at the discretion of the physician. A blood pressure cuff, EKG and other monitors will often be applied during the procedure.  Some patients may need to have extra oxygen administered for a short period. You will be asked to provide medical information, including your allergies and medications, prior to the procedure.  We must know immediately if you are taking blood thinners (like Coumadin/Warfarin) or if you are allergic to IV iodine contrast (dye).  We must know if you could possible be pregnant.  Possible side-effects:  Bleeding from needle site Infection (rare, may require surgery) Nerve injury (rare) Numbness & tingling (temporary) Difficulty urinating (rare, temporary) Spinal headache (a headache worse with upright posture) Light-headedness (temporary) Pain at injection site (serveral days) Decreased blood pressure (rare,  temporary) Weakness in arm/leg (temporary) Pressure sensation in back/neck (temporary)   Call if you experience:  Fever/chills associated with headache or increased back/neck pain Headache worsened by an upright position New onset, weakness or numbness of an extremity below the injection site Hives or difficulty breathing (go to the emergency room) Inflammation or drainage at the injection site(s) Severe back/neck pain greater than usual New symptoms which are concerning to you  Please note:  Although the local anesthetic injected can often make your back or neck feel good for several hours after the injection, the pain will likely return. It takes 3-7 days for steroids to work.  You may not notice any pain relief for at least one week.  If effective, we will often do a series of 2-3 injections spaced 3-6 weeks apart to maximally decrease your pain.  After the initial series, you may be a candidate for a more permanent nerve block of the facets.  If you have any questions, please call [phone] [facility]

## 2024-03-07 NOTE — Progress Notes (Signed)
 PROVIDER NOTE: Interpretation of information contained herein should be left to medically-trained personnel. Specific patient instructions are provided elsewhere under Patient Instructions section of medical record. This document was created in part using STT-dictation technology, any transcriptional errors that may result from this process are unintentional.  Patient: [name] Type: Established DOB: [birth year] MRN: [id number] PCP: Fernand Fredy RAMAN, MD  Service: Procedure DOS: 03/07/2024 Setting: Ambulatory Location: Ambulatory outpatient facility Delivery: Face-to-face Provider: Wallie Sherry, MD Specialty: Interventional Pain Management Specialty designation: 09 Location: Outpatient facility Ref. Prov.: Fernand Fredy RAMAN, MD       Interventional Therapy   Type: Lumbar Facet, Medial Branch Block(s) (w/ fluoroscopic mapping) #1  Laterality: Bilateral  Level: L4, L5, and S1 Medial Branch/Dorsal Rami Level(s). Injecting these levels blocks the L4-5 and L5-S1 lumbar facet joints. Imaging: Fluoroscopic guidance Spinal ([id number]) Anesthesia: Local anesthesia (1-2% Lidocaine ) DOS: 03/07/2024 Performed by: Wallie Sherry, MD  Primary Purpose: Diagnostic/Therapeutic Indications: Low back pain severe enough to impact quality of life or function. 1. Lumbar facet arthropathy   2. Failed back surgical syndrome   3. Chronic pain syndrome    NAS-11 Pain score:   Pre-procedure: 9 /10   Post-procedure: 9 /10     Position / Prep / Materials:  Position: Prone  Prep solution: ChloraPrep (2% chlorhexidine  gluconate and 70% isopropyl alcohol) Area Prepped: Posterolateral Lumbosacral Spine (Wide prep: From the lower border of the scapula down to the end of the tailbone and from flank to flank.)  Materials:  Tray: Block Needle(s):  Type: Spinal  Gauge (G): 22  Length: 3.5-in Qty: 2     H&P (Pre-op Assessment):  [name] is a 78 y.o. (year old), female patient, seen today for  interventional treatment. She  has a past surgical history that includes Cataract extraction; Cervical fusion (N/A); Cardiac catheterization (Left, 02/23/2007); Aneurysm coiling (08/2013); Back surgery; Bartholin cyst marsupialization; Upper esophageal endoscopic ultrasound (eus) (N/A, 04/15/2016); Breast cyst aspiration (Right, 01/25/2012); Colonoscopy with propofol  (N/A, 10/04/2019); polypectomy (N/A, 10/04/2019); Breast excisional biopsy (Left, 06/26/2007); Bunionectomy (Bilateral, 2022); CORONARY STENT INTERVENTION (Left, 02/27/2007); Cardiac catheterization (Left, 07/31/2007); Cardiac catheterization (Left, 10/28/2010); Cardiac catheterization (Left, 05/31/2013); Carotid arteriogram; 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 (N/A, 07/18/2006); 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 [name] has a current medication list which includes the following prescription(s): albuterol , allopurinol , aspirin  ec, calcitriol, citalopram , clotrimazole -betamethasone, cyanocobalamin, ezetimibe , fluticasone , furosemide , gabapentin , glimepiride , hydralazine , hydrocodone -acetaminophen , hydroxychloroquine , labetalol , levocetirizine, losartan , methocarbamol , nitroglycerin , ondansetron , pantoprazole, pantoprazole, rosuvastatin , senna, symbicort, tramadol , tretinoin, and zolpidem . Her primarily concern today is the Back Pain  Initial  Vital Signs:  Pulse/HCG Rate: 69ECG Heart Rate: 73 Temp: 97.8 F (36.6 C) Resp: 18 BP: 138/78 SpO2: 96 %  BMI: Estimated body mass index is 31.89 kg/m as calculated from the following:   Height as of this encounter: 5' 3 (1.6 m).   Weight as of this encounter: 180 lb (81.6 kg).  Risk Assessment: Allergies: Reviewed. She is allergic to isosorbide dinitrate, amlodipine, sulfa antibiotics, tizanidine hcl, oxycodone , and sulfasalazine.  Allergy Precautions: None required Coagulopathies: Reviewed. None identified.  Blood-thinner therapy: None at  this time Active Infection(s): Reviewed. None identified. [name] is afebrile  Site Confirmation: [name] was asked to confirm the procedure and laterality before marking the site Procedure checklist: Completed Consent: Before the procedure and under the influence of no sedative(s), amnesic(s), or anxiolytics, the patient was informed of the treatment options, risks and possible complications. To fulfill our ethical and legal obligations, as recommended by the American Medical Association's Code of Ethics, I have informed the patient of my clinical impression; the nature and purpose of the treatment or procedure; the risks, benefits, and possible complications of the intervention; the alternatives, including doing nothing; the risk(s) and benefit(s) of the alternative treatment(s) or procedure(s); and the risk(s) and benefit(s) of doing nothing. The patient was provided information about the general risks and possible complications associated with the procedure. These may include, but are not limited to: failure to achieve desired goals, infection, bleeding, organ or nerve damage, allergic reactions, paralysis, and death. In addition, the patient was informed of those risks and complications associated to Spine-related procedures, such as failure to decrease pain; infection (i.e.: Meningitis, epidural or intraspinal abscess); bleeding (i.e.:  epidural hematoma, subarachnoid hemorrhage, or any other type of intraspinal or peri-dural bleeding); organ or nerve damage (i.e.: Any type of peripheral nerve, nerve root, or spinal cord injury) with subsequent damage to sensory, motor, and/or autonomic systems, resulting in permanent pain, numbness, and/or weakness of one or several areas of the body; allergic reactions; (i.e.: anaphylactic reaction); and/or death. Furthermore, the patient was informed of those risks and complications associated with the medications. These include, but are not limited to: allergic reactions (i.e.: anaphylactic or anaphylactoid reaction(s)); adrenal axis suppression; blood sugar elevation that in diabetics may result in ketoacidosis or comma; water  retention that in patients with history of congestive heart failure may result in shortness of breath, pulmonary edema, and decompensation with resultant heart failure; weight gain; swelling or edema; medication-induced neural toxicity; particulate matter embolism and blood vessel occlusion with resultant organ, and/or nervous system infarction; and/or aseptic necrosis of one or more joints. Finally, the patient was informed that Medicine is not an exact science; therefore, there is also the possibility of unforeseen or unpredictable risks and/or possible complications that may result in a catastrophic outcome. The patient indicated having understood very clearly. We have given the patient no guarantees and we have made no promises. Enough time was given to the patient to ask questions, all of which were answered to the patient's satisfaction. Ms. Stiverson has indicated that she wanted to continue with the procedure. Attestation: I, the ordering provider, attest that I have discussed with the patient the benefits, risks, side-effects, alternatives, likelihood of achieving goals, and potential problems during recovery for the procedure that I have provided informed consent. Date   Time: 03/07/2024 11:43 AM  Pre-Procedure Preparation:  Monitoring: As per clinic protocol. Respiration, ETCO2, SpO2, BP, heart rate and rhythm monitor placed and checked for adequate function Safety Precautions: Patient was assessed for positional comfort and pressure points before starting the procedure. Time-out: I initiated and conducted the Time-out before starting the procedure, as per protocol. The patient was asked to participate by confirming the accuracy of the Time Out information. Verification of the correct person, site, and procedure were performed and confirmed by me, the nursing staff, and the patient. Time-out conducted as per Joint Commission's Universal Protocol (UP.01.01.01). Time: 1215 Start Time: 1215 hrs.  Description of Procedure:          Laterality: (see above) Targeted  Levels: (see above)  Safety Precautions: Aspiration looking for blood return was conducted prior to all injections. At no point did we inject any substances, as a needle was being advanced. Before injecting, the patient was told to immediately notify me if she was experiencing any new onset of ringing in the ears, or metallic taste in the mouth. No attempts were made at seeking any paresthesias. Safe injection practices and needle disposal techniques used. Medications properly checked for expiration dates. SDV (single dose vial) medications used. After the completion of the procedure, all disposable equipment used was discarded in the proper designated medical waste containers. Local Anesthesia: Protocol guidelines were followed. The patient was positioned over the fluoroscopy table. The area was prepped in the usual manner. The time-out was completed. The target area was identified using fluoroscopy. A 12-in long, straight, sterile hemostat was used with fluoroscopic guidance to locate the targets for each level blocked. Once located, the skin was marked with an approved surgical skin marker. Once all  sites were marked, the skin (epidermis, dermis, and hypodermis), as well as deeper tissues (fat, connective tissue and muscle) were infiltrated with a small amount of a short-acting local anesthetic, loaded on a 10cc syringe with a 25G, 1.5-in  Needle. An appropriate amount of time was allowed for local anesthetics to take effect before proceeding to the next step. Local Anesthetic: Lidocaine  2.0% The unused portion of the local anesthetic was discarded in the proper designated containers. Technical description of process:  Medial Branch  Dorsal Rami Nerve Block (MBB):  Neuroanatomy note: Each lumbar facet joint receives dual innervation from medial branches arising from the posterior primary rami at the same level and one level above. The target for each lumbar medial branch is the junction of the ipsilateral superior articular and transverse process of the lower vertebral body. (i.e.: The L4-L5 facet joint is innervated by the L4 medial branch [located at L5] and the L3 medial branch [located at L4]. Blocking the L4 Medial Branch is therefore achieved by injecting at the junction of the ipsilateral superior articular and transverse process of the lower vertebral body [L5].).  Exception: The exception to the above rule is the L5-S1 facet joint which has triple innervation requiring the L4 medial branch, as well as the L5 and the S1 Dorsal Rami(s) to be blocked to fully denervate the joint.  Under fluoroscopic guidance, a needle was inserted until contact was made with os over the target area. After negative aspiration, 2mL of the nerve block solution was injected without difficulty or complication. Paresthesia were avoided during injection. The needle(s) were removed intact and without complication.  Once the entire procedure was completed, the treated area was cleaned, making sure to leave some of the prepping solution back to take advantage of its long term bactericidal properties.          Illustration of the posterior view of the lumbar spine and the posterior neural structures. Laminae of L2 through S1 are labeled. DPRL5, dorsal primary ramus of L5; DPRS1, dorsal primary ramus of S1; DPR3, dorsal primary ramus of L3; FJ, facet (zygapophyseal) joint L3-L4; I, inferior articular process of L4; LB1, lateral branch of dorsal primary ramus of L1; IAB, inferior articular branches from L3 medial branch (supplies L4-L5 facet joint); IBP, intermediate branch plexus; MB3, medial branch of dorsal primary ramus of L3; NR3, third lumbar nerve root; S, superior articular process of L5; SAB, superior articular branches from L4 (supplies L4-5 facet joint also); TP3, transverse process of  L3.   Facet Joint Innervation (* possible contribution)  L1-2 T12, L1 (L2*)  Medial Branch  L2-3 L1, L2 (L3*)                     L3-4 L2, L3 (L4*)                     L4-5 L3, L4 (L5*)                     L5-S1 L4, L5, S1                        Vitals:   03/07/24 1156 03/07/24 1214 03/07/24 1220 03/07/24 1224  BP: 138/78 (!) 151/82 (!) 145/71 (!) 153/78  Pulse: 69     Resp: 18 18 17 15   Temp: 97.8 F (36.6 C)     TempSrc: Temporal     SpO2: 96% 98% 97% 95%  Weight: 180 lb (81.6 kg)     Height: 5' 3 (1.6 m)        End Time: 1224 hrs.  Imaging Guidance (Spinal):         Type of Imaging Technique: Fluoroscopy Guidance (Spinal) Indication(s): Fluoroscopy guidance for needle placement to enhance accuracy in procedures requiring precise needle localization for targeted delivery of medication in or near specific anatomical locations not easily accessible without such real-time imaging assistance. Exposure Time: Please see nurses notes. Contrast: None used. Fluoroscopic Guidance: I was personally present during the use of fluoroscopy. Tunnel Vision Technique used to obtain the best possible view of the target area. Parallax error corrected before commencing the procedure.  Direction-depth-direction technique used to introduce the needle under continuous pulsed fluoroscopy. Once target was reached, antero-posterior, oblique, and lateral fluoroscopic projection used confirm needle placement in all planes. Images permanently stored in EMR. Interpretation: No contrast injected. I personally interpreted the imaging intraoperatively. Adequate needle placement confirmed in multiple planes. Permanent images saved into the patient's record.  Post-operative Assessment:  Post-procedure Vital Signs:  Pulse/HCG Rate: 6970 Temp: 97.8 F (36.6 C) Resp: 15 BP: (!) 153/78 SpO2: 95 %  EBL: None  Complications: No immediate post-treatment complications observed by team, or reported by patient.  Note: The patient tolerated the entire procedure well. A repeat set of vitals were taken after the procedure and the patient was kept under observation following institutional policy, for this type of procedure. Post-procedural neurological assessment was performed, showing return to baseline, prior to discharge. The patient was provided with post-procedure discharge instructions, including a section on how to identify potential problems. Should any problems arise concerning this procedure, the patient was given instructions to immediately contact us , at any time, without hesitation. In any case, we plan to contact the patient by telephone for a follow-up status report regarding this interventional procedure.  Comments:  No additional relevant information.  Plan of Care (POC)  Orders:  Orders Placed This Encounter  Procedures   DG PAIN [facility] C-ARM 1-60 MIN NO REPORT    Intraoperative interpretation by procedural physician at [facility].    Standing Status:   Standing    Number of Occurrences:   1    Reason for exam::   Assistance in needle guidance and placement for procedures requiring needle placement in or near specific anatomical locations not easily accessible  without such assistance.   Medications ordered for procedure: Meds ordered this encounter  Medications   lidocaine  (XYLOCAINE )  2 % (with pres) injection 400 mg   dexamethasone  (DECADRON ) injection 20 mg   ropivacaine  (PF) 2 mg/mL (0.2%) (NAROPIN ) injection 18 mL   Medications administered: We administered lidocaine , dexamethasone , and ropivacaine  (PF) 2 mg/mL (0.2%).  See the medical record for exact dosing, route, and time of administration.    B/L Cluneal NB 08/24/23, B/L L4,5, S1 MBNB 03/07/24      Follow-up plan:   Return in about 3 weeks (around 03/28/2024) for PPE, VV.     Recent Visits Date Type Provider Dept  02/14/24 Office Visit Marcelino Nurse, MD [facility]  Showing recent visits within past 90 days and meeting all other requirements Today's Visits Date Type Provider Dept  03/07/24 Procedure visit Marcelino Nurse, MD [facility]  Showing today's visits and meeting all other requirements Future Appointments Date Type Provider Dept  03/13/24 Appointment Marcelino Nurse, MD [facility]  03/28/24 Appointment Marcelino Nurse, MD [facility]  Showing future appointments within next 90 days and meeting all other requirements   Disposition: Discharge home  Discharge (Date  Time): 03/07/2024; 1235 hrs.   Primary Care Physician: Fernand Fredy RAMAN, MD Location: [facility] Note by: Nurse Marcelino, MD (TTS technology used. I apologize for any typographical errors that were not detected and corrected.) Date: 03/07/2024; Time: 1:17 PM  Disclaimer:  Medicine is not an Visual merchandiser. The only guarantee in medicine is that nothing is guaranteed. It is important to note that the decision to proceed with this intervention was based on the information collected from the patient. The Data and conclusions were drawn from the patient's questionnaire, the interview, and the physical examination. Because the information was provided  in large part by the patient, it cannot be guaranteed that it has not been purposely or unconsciously manipulated. Every effort has been made to obtain as much relevant data as possible for this evaluation. It is important to note that the conclusions that lead to this procedure are derived in large part from the available data. Always take into account that the treatment will also be dependent on availability of resources and existing treatment guidelines, considered by other Pain Management Practitioners as being common knowledge and practice, at the time of the intervention. For Medico-Legal purposes, it is also important to point out that variation in procedural techniques and pharmacological choices are the acceptable norm. The indications, contraindications, technique, and results of the above procedure should only be interpreted and judged by a Board-Certified Interventional Pain Specialist with extensive familiarity and expertise in the same exact procedure and technique.

## 2024-03-07 NOTE — Patient Instructions (Signed)
 [name] - I am sorry I was unable to reach you today for our scheduled appointment. I work with Fernand Fredy RAMAN, MD and am calling to support your healthcare needs. Please contact me at [phone] at your earliest convenience. I look forward to speaking with you soon.   Thank you,  Suzen L. Ramonita, RN, BSN, CCM [place]  [facility], [facility] RN Care Manager Direct Dial: [phone]  Fax: [phone]

## 2024-03-08 ENCOUNTER — Telehealth: Payer: Self-pay | Admitting: *Deleted

## 2024-03-08 ENCOUNTER — Other Ambulatory Visit: Payer: Self-pay | Admitting: Internal Medicine

## 2024-03-08 DIAGNOSIS — E1165 Type 2 diabetes mellitus with hyperglycemia: Secondary | ICD-10-CM

## 2024-03-08 DIAGNOSIS — E1159 Type 2 diabetes mellitus with other circulatory complications: Secondary | ICD-10-CM | POA: Diagnosis not present

## 2024-03-08 DIAGNOSIS — M320 Drug-induced systemic lupus erythematosus: Secondary | ICD-10-CM

## 2024-03-08 DIAGNOSIS — E1169 Type 2 diabetes mellitus with other specified complication: Secondary | ICD-10-CM | POA: Diagnosis not present

## 2024-03-08 DIAGNOSIS — N184 Chronic kidney disease, stage 4 (severe): Secondary | ICD-10-CM

## 2024-03-08 DIAGNOSIS — M3214 Glomerular disease in systemic lupus erythematosus: Secondary | ICD-10-CM | POA: Diagnosis not present

## 2024-03-08 DIAGNOSIS — I152 Hypertension secondary to endocrine disorders: Secondary | ICD-10-CM

## 2024-03-08 NOTE — Telephone Encounter (Signed)
 Post procedure call; reports that left side is doing well and would like to discuss having injections on contralateral.  Discussed that she could discuss that at her f/up.

## 2024-03-09 ENCOUNTER — Other Ambulatory Visit: Payer: Self-pay | Admitting: *Deleted

## 2024-03-09 ENCOUNTER — Ambulatory Visit: Payer: Self-pay | Admitting: Internal Medicine

## 2024-03-09 NOTE — Patient Outreach (Signed)
 Complex Care Management   Visit Note  03/29/2024 updated note for 03/09/24  Name:  [name] MRN: [id number] DOB: [birth year]  Situation: Referral received for Complex Care Management related to Chronic Kidney Disease I obtained verbal consent from Patient.  Visit completed with Patient  on the phone  Pain shots obtained recently and is hoping it will help her back pain  Now pain of left ankle/knee, left toe, left shoulder Today in the bed Pain level 3  sore use medicines, ice pack, ice cream  Can't walk without a stick Daughter is at the home caring for her   Diabetes elevation in cbgs Still has an HgA1c = 5.9 no change   Chronic Kidney disease (CKD) Both legs are swollen Has ted hose but she confirms she does not wear them they are in drawer  She will keep them elevated  She is    Cholesterol total 205 She voices she is aware   Lupus pending labs from 03/08/24  Concern with weakening anal sphincter she does prefer   Patient is aware that she will receive a future call from another RN CM in October 2025  Pending a scooter  Background:   Past Medical History:  Diagnosis Date   Anemia of chronic renal disease    Arthritis    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 b.)07/18/2006 -endovas oblit complex ACOM aneur. c.) Endovas Tx of 6.8x18mm RMCA aneur. d.) 3.5x54mm remnant RMCA aneur 2/2 coil compaction. e.) Interval 3.2x3.21mm saccular outpouching in ACOM c/w mild recannulization in neck. 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 g.) 3.7x70mm remnant of prev Tx'd ACOM aneur -failed embol 02/24/2012.   CKD (chronic kidney disease), stage IV (HCC)    a.) solitary functioning kidney on the RIGHT   Complication of anesthesia    a.) delayed emergence   Coronary artery disease 02/23/2007   a.) LHC 02/23/2007 --> EF 50%; 30% pLAD, 70% mRCA --> planned for staged PCI. b.) PCI 02/27/2007: EF 60%; 3.5 x 15 mm Vision BMS to 80%  mRCA. b.) LHC 07/24/2007: EF 60%; minor irregs; no occlusive CAD; no intervention. c.) LHC 10/28/2010: EF 60%; 30% mLAD, LCx with minor luminal irregs, 20% ISR mRCA; no intervention. d.) LHC 05/31/2013: EF 60%; 40% mLAD, 30% ISR m-dRCA; no interventions.   DDD (degenerative disc disease), cervical    a.) s/p ACDF C5-C7; hardware in neck; patient appreciates stiffness and issues with mobility   Diastolic dysfunction    a.)  TTE 07/07/2021: EF 87.8%; normal PASP; trace TR/MR; G1DD.   Elevated LEFT hemidiaphragm    Gout    Headache(784.0)    Heart murmur    HLD (hyperlipidemia)    HOH (hard of hearing)    Has hearing aids, doesn't wear   Hyperkalemia    Hyperparathyroidism due to renal insufficiency    Hypertension    Incomplete right bundle branch block (RBBB)    Insomnia    a.) takes zolpidem    Long term (current) use of immunomodulator    a.) on DMARD therapy (hydroxychloroquine ) for RA/SLE Dx.   Lumbar adjacent segment disease with spondylolisthesis    Multiple acquired cysts of kidney    On chronic clopidogrel  therapy    OSA (obstructive sleep apnea)    a.) does NOT use nocturnal pap therapy   Osteoarthritis    Pancreatic cyst    Peripheral vascular disease    Pneumonia    Post-COVID chronic cough  Pulmonary nodule    RBBB (right bundle branch block)    Rectal bleeding    Rheumatoid arthritis (HCC)    a.) on DMARD; hydroxychloriquine   Shortness of breath 03/01/2023   Systemic lupus erythematosus (HCC)    T2DM (type 2 diabetes mellitus) (HCC)    Tendinitis of left wrist    Thickened endometrium    Thickened endometrium    Wears dentures    partial lower    Assessment: Patient Reported Symptoms:  Cognitive Cognitive Status: No symptoms reported, Insightful and able to interpret abstract concepts      Neurological Neurological Review of Symptoms: Headaches Neurological Management Strategies: Adequate rest, Medication therapy, Routine screening Neurological  Self-Management Outcome: 3 (uncertain) Neurological Comment: lupus  HEENT HEENT Symptoms Reported: No symptoms reported      Cardiovascular Cardiovascular Symptoms Reported: No symptoms reported Cardiovascular Self-Management Outcome: 4 (good)  Respiratory Respiratory Symptoms Reported: No symptoms reported Respiratory Self-Management Outcome: 4 (good)  Endocrine Endocrine Symptoms Reported: Shortness of breath, Nausea or vomiting Is patient diabetic?: Yes Is patient checking blood sugars at home?: No Endocrine Self-Management Outcome: 3 (uncertain)  Gastrointestinal Gastrointestinal Symptoms Reported: Constipation Additional Gastrointestinal Details: concern wtih having stools when she voids  aware of kegel exercises Gastrointestinal Management Strategies: Diet modification, Exercise Gastrointestinal Self-Management Outcome: 3 (uncertain)    Genitourinary Genitourinary Symptoms Reported: No symptoms reported Genitourinary Management Strategies: Adequate rest Genitourinary Self-Management Outcome: 4 (good)  Integumentary Integumentary Symptoms Reported: No symptoms reported Skin Self-Management Outcome: 4 (good)  Musculoskeletal Musculoskelatal Symptoms Reviewed: Difficulty walking, Joint pain, Limited mobility, Muscle pain, Weakness        Psychosocial Psychosocial Symptoms Reported: No symptoms reported Behavioral Management Strategies: Support system, Medication therapy, Adequate rest Behavioral Health Self-Management Outcome: 4 (good) Major Change/Loss/Stressor/Fears (CP): Medical condition, self Techniques to Cope with Loss/Stress/Change: Diversional activities, Spiritual practice(s) Quality of Family Relationships: helpful, supportive Do you feel physically threatened by others?: No    03/29/2024    PHQ2-9 Depression Screening   Little interest or pleasure in doing things    Feeling down, depressed, or hopeless    PHQ-2 - Total Score    Trouble falling or staying asleep,  or sleeping too much    Feeling tired or having little energy    Poor appetite or overeating     Feeling bad about yourself - or that you are a failure or have let yourself or your family down    Trouble concentrating on things, such as reading the newspaper or watching television    Moving or speaking so slowly that other people could have noticed.  Or the opposite - being so fidgety or restless that you have been moving around a lot more than usual    Thoughts that you would be better off dead, or hurting yourself in some way    PHQ2-9 Total Score    If you checked off any problems, how difficult have these problems made it for you to do your work, take care of things at home, or get along with other people    Depression Interventions/Treatment      There were no vitals filed for this visit.  Medications Reviewed Today   Medications were not reviewed in this encounter     Recommendation:   PCP Follow-up Continue Current Plan of Care  Follow Up Plan:   Telephone follow up appointment date/time:  04/09/24 1100 with [facility] Carrier L. Ramonita, RN, BSN, CCM Foreman  [facility] RN Care Manager Direct Dial: [phone]  Fax: [phone]

## 2024-03-11 LAB — FANA STAINING PATTERNS: Speckled Pattern: 1:80 {titer}

## 2024-03-11 LAB — ANTINUCLEAR ANTIBODIES, IFA: ANA Titer 1: POSITIVE — AB

## 2024-03-12 ENCOUNTER — Ambulatory Visit: Payer: Self-pay | Admitting: Internal Medicine

## 2024-03-12 DIAGNOSIS — N184 Chronic kidney disease, stage 4 (severe): Secondary | ICD-10-CM | POA: Diagnosis not present

## 2024-03-12 LAB — CBC WITH DIFFERENTIAL/PLATELET
Basophils Absolute: 0 x10E3/uL (ref 0.0–0.2)
Basos: 0 %
EOS (ABSOLUTE): 0 x10E3/uL (ref 0.0–0.4)
Eos: 0 %
Hematocrit: 29.3 % — ABNORMAL LOW (ref 34.0–46.6)
Hemoglobin: 9.1 g/dL — ABNORMAL LOW (ref 11.1–15.9)
Immature Grans (Abs): 0 x10E3/uL (ref 0.0–0.1)
Immature Granulocytes: 0 %
Lymphocytes Absolute: 1.3 x10E3/uL (ref 0.7–3.1)
Lymphs: 13 %
MCH: 29.4 pg (ref 26.6–33.0)
MCHC: 31.1 g/dL — ABNORMAL LOW (ref 31.5–35.7)
MCV: 95 fL (ref 79–97)
Monocytes Absolute: 0.5 x10E3/uL (ref 0.1–0.9)
Monocytes: 5 %
Neutrophils Absolute: 7.7 x10E3/uL — ABNORMAL HIGH (ref 1.4–7.0)
Neutrophils: 82 %
Platelets: 177 x10E3/uL (ref 150–450)
RBC: 3.09 x10E6/uL — ABNORMAL LOW (ref 3.77–5.28)
RDW: 13 % (ref 11.7–15.4)
WBC: 9.4 x10E3/uL (ref 3.4–10.8)

## 2024-03-12 LAB — CMP14+EGFR
ALT: 10 IU/L (ref 0–32)
AST: 12 IU/L (ref 0–40)
Albumin: 4.1 g/dL (ref 3.8–4.8)
Alkaline Phosphatase: 75 IU/L (ref 49–135)
BUN/Creatinine Ratio: 17 (ref 12–28)
BUN: 62 mg/dL — ABNORMAL HIGH (ref 8–27)
Bilirubin Total: 0.6 mg/dL (ref 0.0–1.2)
CO2: 17 mmol/L — ABNORMAL LOW (ref 20–29)
Calcium: 9.4 mg/dL (ref 8.7–10.3)
Chloride: 113 mmol/L — ABNORMAL HIGH (ref 96–106)
Creatinine, Ser: 3.73 mg/dL — ABNORMAL HIGH (ref 0.57–1.00)
Globulin, Total: 2.1 g/dL (ref 1.5–4.5)
Glucose: 93 mg/dL (ref 70–99)
Potassium: 5.5 mmol/L — ABNORMAL HIGH (ref 3.5–5.2)
Sodium: 145 mmol/L — ABNORMAL HIGH (ref 134–144)
Total Protein: 6.2 g/dL (ref 6.0–8.5)
eGFR: 12 mL/min/1.73 — ABNORMAL LOW (ref >59)

## 2024-03-12 LAB — LIPID PANEL W/O CHOL/HDL RATIO
Cholesterol, Total: 205 mg/dL — ABNORMAL HIGH (ref 100–199)
HDL: 54 mg/dL (ref >39)
LDL Chol Calc (NIH): 129 mg/dL — ABNORMAL HIGH (ref 0–99)
Triglycerides: 122 mg/dL (ref 0–149)
VLDL Cholesterol Cal: 22 mg/dL (ref 5–40)

## 2024-03-12 LAB — HEMOGLOBIN A1C
Est. average glucose Bld gHb Est-mCnc: 123 mg/dL
Hgb A1c MFr Bld: 5.9 % — ABNORMAL HIGH (ref 4.8–5.6)

## 2024-03-13 ENCOUNTER — Encounter: Admitting: Student in an Organized Health Care Education/Training Program

## 2024-03-15 ENCOUNTER — Ambulatory Visit
Admission: RE | Admit: 2024-03-15 | Discharge: 2024-03-15 | Disposition: A | Discharge status: Home / Self Care | Attending: Cardiology | Admitting: Cardiology

## 2024-03-15 ENCOUNTER — Ambulatory Visit
Admission: RE | Admit: 2024-03-15 | Discharge: 2024-03-15 | Disposition: A | Discharge status: Home / Self Care | Source: Ambulatory Visit | Attending: Cardiology

## 2024-03-15 ENCOUNTER — Ambulatory Visit (INDEPENDENT_AMBULATORY_CARE_PROVIDER_SITE_OTHER): Admitting: Cardiology

## 2024-03-15 ENCOUNTER — Encounter: Payer: Self-pay | Admitting: Cardiology

## 2024-03-15 VITALS — BP 138/78 | HR 70 | Ht 63.0 in | Wt 180.0 lb

## 2024-03-15 DIAGNOSIS — M7731 Calcaneal spur, right foot: Secondary | ICD-10-CM | POA: Diagnosis not present

## 2024-03-15 DIAGNOSIS — M19071 Primary osteoarthritis, right ankle and foot: Secondary | ICD-10-CM | POA: Diagnosis not present

## 2024-03-15 DIAGNOSIS — M79671 Pain in right foot: Secondary | ICD-10-CM | POA: Insufficient documentation

## 2024-03-15 DIAGNOSIS — S92354D Nondisplaced fracture of fifth metatarsal bone, right foot, subsequent encounter for fracture with routine healing: Secondary | ICD-10-CM | POA: Diagnosis not present

## 2024-03-15 DIAGNOSIS — Z0130 Encounter for examination of blood pressure without abnormal findings: Secondary | ICD-10-CM

## 2024-03-15 NOTE — Progress Notes (Signed)
 Established Patient Office Visit  Subjective:  Patient ID: [name], female    DOB: 02/28/1946  Age: 78 y.o. MRN: [id number]  Chief Complaint  Patient presents with   Acute Visit    R Foot Pain x 8 days    Patient in office for an acute visit, complaining of right foot pain for the past 8 days. Patient fell in her yard 8 days ago. Patient reports since then, right foot swollen, painful, bruised. Will order an xray to rule out fracture. Recommend continuing to ice and elevate. Patient taking ibuprofen and Vicodin with no relief, requesting a refill on her tramadol . Recommend getting a brace from the pharmacy to mobilize ankle.   Foot Injury  The incident occurred more than 1 week ago. The incident occurred at home. The injury mechanism was a fall. The pain is present in the right ankle. The quality of the pain is described as aching. The pain is at a severity of 8/10. The pain is moderate. The pain has been Fluctuating since onset. Associated symptoms include an inability to bear weight and numbness. Pertinent negatives include no tingling. She reports no foreign bodies present. The symptoms are aggravated by movement and weight bearing. She has tried NSAIDs, ice, non-weight bearing and acetaminophen  for the symptoms. The treatment provided no relief.    No other concerns at this time.   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 b.)07/18/2006 -endovas oblit complex ACOM aneur. c.) Endovas Tx of 6.8x104mm RMCA aneur. d.) 3.5x51mm remnant RMCA aneur 2/2 coil compaction. e.) Interval 3.2x3.48mm saccular outpouching in ACOM c/w mild recannulization in neck. 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 g.) 3.7x34mm remnant of prev Tx'd ACOM aneur -failed embol 02/24/2012.   CKD (chronic kidney disease), stage IV (HCC)    a.) solitary functioning kidney  on the RIGHT   Complication of anesthesia    a.) delayed emergence   Coronary artery disease 02/23/2007   a.) LHC 02/23/2007 --> EF 50%; 30% pLAD, 70% mRCA --> planned for staged PCI. b.) PCI 02/27/2007: EF 60%; 3.5 x 15 mm Vision BMS to 80% mRCA. b.) LHC 07/24/2007: EF 60%; minor irregs; no occlusive CAD; no intervention. c.) LHC 10/28/2010: EF 60%; 30% mLAD, LCx with minor luminal irregs, 20% ISR mRCA; no intervention. d.) LHC 05/31/2013: EF 60%; 40% mLAD, 30% ISR m-dRCA; no interventions.   DDD (degenerative disc disease), cervical    a.) s/p ACDF C5-C7; hardware in neck; patient appreciates stiffness and issues with mobility   Diastolic dysfunction    a.)  TTE 07/07/2021: EF 87.8%; normal PASP; trace TR/MR; G1DD.   Elevated LEFT hemidiaphragm    Gout    Headache(784.0)    Heart murmur    HLD (hyperlipidemia)    HOH (hard of hearing)    Has hearing aids, doesn't wear   Hyperkalemia    Hyperparathyroidism due to renal insufficiency    Hypertension    Incomplete right bundle branch block (RBBB)    Insomnia    a.) takes zolpidem    Long term (current) use of immunomodulator    a.) on DMARD therapy (hydroxychloroquine ) for RA/SLE Dx.   Lumbar adjacent segment disease with spondylolisthesis    Multiple acquired cysts of kidney    On chronic clopidogrel  therapy    OSA (obstructive sleep apnea)    a.)  does NOT use nocturnal pap therapy   Osteoarthritis    Pancreatic cyst    Peripheral vascular disease    Pneumonia    Post-COVID chronic cough    Pulmonary nodule    RBBB (right bundle branch block)    Rectal bleeding    Rheumatoid arthritis (HCC)    a.) on DMARD; hydroxychloriquine   Shortness of breath 03/01/2023   Systemic lupus erythematosus (HCC)    T2DM (type 2 diabetes mellitus) (HCC)    Tendinitis of left wrist    Thickened endometrium    Thickened endometrium    Wears dentures    partial lower    Past Surgical History:  Procedure Laterality Date   ANEURYSM COILING   08/2013   ANTERIOR LATERAL LUMBAR FUSION WITH PERCUTANEOUS SCREW 1 LEVEL N/A 03/09/2023   Procedure: L3-4 LATERAL LUMBAR INTERBODY FUSION;  Surgeon: Clois Fret, MD;  Location: [facility];  Service: Neurosurgery;  Laterality: N/A;   APPLICATION OF INTRAOPERATIVE CT SCAN N/A 03/09/2023   Procedure: APPLICATION OF INTRAOPERATIVE CT SCAN;  Surgeon: Clois Fret, MD;  Location: [facility];  Service: Neurosurgery;  Laterality: N/A;   Attempted embolization of previously treated ACOM aneurysm; unsuccessful N/A 01/28/2012   Location: [facility]   BACK SURGERY     BARTHOLIN CYST MARSUPIALIZATION     BICEPT TENODESIS Right 03/02/2022   Procedure: BICEPS TENODESIS;  Surgeon: Edie Norleen PARAS, MD;  Location: [facility];  Service: Orthopedics;  Laterality: Right;   BREAST CYST ASPIRATION Right 01/25/2012   FNA neg.   BREAST EXCISIONAL BIOPSY Left 06/26/2007   neg   BUNIONECTOMY Bilateral 2022   CARDIAC CATHETERIZATION Left 02/23/2007   Procedure: CARDIAC CATHETERIZATION; Location: [facility]; Surgeon: Denyse Bathe, MD   CARDIAC CATHETERIZATION Left 07/31/2007   Procedure: CARDIAC CATHETERIZATION; Location: [facility]; Surgeon: Denyse Bathe, MD   CARDIAC CATHETERIZATION Left 10/28/2010   Procedure: CARDIAC CATHETERIZATION; Location: [facility]; Surgeon: Denyse Bathe, MD   CARDIAC CATHETERIZATION Left 05/31/2013   Procedure: CARDIAC CATHETERIZATION; Location: [facility]; Surgeon: Denyse Bathe, MD   Carotid arteriogram; 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 N/A 07/18/2006   Location: [facility]   CATARACT EXTRACTION     CERVICAL FUSION N/A    Procedure: ACDF C5-C7   COLONOSCOPY WITH PROPOFOL  N/A 10/04/2019   Procedure: COLONOSCOPY WITH BIOPSY;  Surgeon: Unk Corinn Skiff, MD;  Location: [facility];  Service: Endoscopy;  Laterality: N/A;  Diabetic (borderline) - oral meds priority 3   CORONARY STENT INTERVENTION Left 02/27/2007   Procedure: CORONARY STENT  INTERVENTION (3.5 x 15 mm Vision BMS to mRCA); Location: [facility]; Surgeon: Deatrice Cage, MD   Endovascular near complete obliteration of enlarging neck remnant of previously treated RIGHT MCA aneurysm using stent assisted coiling N/A 09/03/2010   Location: [facility]   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 N/A 10/03/2006   Location: [facility]   HYSTEROSCOPY WITH D & C N/A 08/04/2021   Procedure: DILATATION AND CURETTAGE /HYSTEROSCOPY;  Surgeon: Victor Claudell SAUNDERS, MD;  Location: [facility];  Service: Gynecology;  Laterality: N/A;   POLYPECTOMY N/A 10/04/2019   Procedure: POLYPECTOMY;  Surgeon: Unk Corinn Skiff, MD;  Location: [facility];  Service: Endoscopy;  Laterality: N/A;   REVERSE SHOULDER ARTHROPLASTY Right 03/02/2022   Procedure: REVERSE SHOULDER ARTHROPLASTY;  Surgeon: Edie Norleen PARAS, MD;  Location: [facility];  Service: Orthopedics;  Laterality: Right;   UPPER ESOPHAGEAL ENDOSCOPIC ULTRASOUND (EUS) N/A 04/15/2016   Procedure: UPPER ESOPHAGEAL ENDOSCOPIC ULTRASOUND (EUS);  Surgeon: Asberry DELENA Coffee, MD;  Location: [facility];  Service: Gastroenterology;  Laterality: N/A;    Social History   Socioeconomic History   Marital status: Widowed    Spouse name: Not on file   Number of children: Not on file   Years of education: Not on file   Highest education level: Not on file  Occupational History   Not on file  Tobacco Use   Smoking status: Never    Passive exposure: Never   Smokeless tobacco: Never  Vaping Use   Vaping status: Never Used  Substance and Sexual Activity   Alcohol use: Yes    Comment: Occasionally at christmas   Drug use: No   Sexual activity: Not Currently    Birth control/protection: Post-menopausal  Other Topics Concern   Not on file  Social History Narrative   Lives with daughter   Social Drivers of Health   Financial Resource Strain: Low Risk  (02/01/2024)   Overall Financial Resource  Strain (CARDIA)    Difficulty of Paying Living Expenses: Not hard at all  Food Insecurity: No Food Insecurity (02/01/2024)   Hunger Vital Sign    Worried About Running Out of Food in the Last Year: Never true    Ran Out of Food in the Last Year: Never true  Transportation Needs: No Transportation Needs (02/01/2024)   PRAPARE - Administrator, Civil Service (Medical): No    Lack of Transportation (Non-Medical): No  Physical Activity: Insufficiently Active (02/01/2024)   Exercise Vital Sign    Days of Exercise per Week: 2 days    Minutes of Exercise per Session: 30 min  Stress: No Stress Concern Present (10/11/2023)   [employer] of Occupational Health - Occupational Stress Questionnaire    Feeling of Stress : Not at all  Social Connections: Moderately Isolated (10/11/2023)   Social Connection and Isolation Panel    Frequency of Communication with Friends and Family: More than three times a week    Frequency of Social Gatherings with Friends and Family: More than three times a week    Attends Religious Services: Never    Database administrator or Organizations: Yes    Attends Banker Meetings: Never    Marital Status: Widowed  Intimate Partner Violence: Not At Risk (02/01/2024)   Humiliation, Afraid, Rape, and Kick questionnaire    Fear of Current or Ex-Partner: No    Emotionally Abused: No    Physically Abused: No    Sexually Abused: No    Family History  Problem Relation Age of Onset   Breast cancer Paternal Aunt 48   Breast cancer Paternal Aunt    Heart disease Mother    Heart disease Father    Stroke Father    Diabetes Sister    Diabetes Brother     Allergies  Allergen Reactions   Isosorbide Dinitrate Other (See Comments)    Collapse   Amlodipine Swelling   Sulfa Antibiotics Itching   Tizanidine Hcl    Oxycodone  Itching   Sulfasalazine Itching    Outpatient Medications Prior to Visit  Medication Sig   albuterol  (VENTOLIN  HFA) 108 (90  Base) MCG/ACT inhaler Inhale 2 puffs into the lungs every 6 (six) hours as needed for wheezing or shortness of breath.   allopurinol  (ZYLOPRIM ) 100 MG tablet TAKE 1 TABLET BY MOUTH EVERY DAY   aspirin  EC 81 MG tablet Take 1 tablet (81 mg total) by mouth daily. Swallow whole.   calcitRIOL (ROCALTROL)  0.25 MCG capsule Take 0.25 mcg by mouth.   citalopram  (CELEXA ) 10 MG tablet TAKE 1 TABLET BY MOUTH EVERY DAY   clotrimazole -betamethasone (LOTRISONE) cream APPLY TWICE A DAY TO GROIN AREA SKIN FOR 1 WEEK AND THEN AS NEEDED.   Cyanocobalamin (B-12 PO) Take by mouth.   ezetimibe  (ZETIA ) 10 MG tablet Take 1 tablet (10 mg total) by mouth daily.   fluticasone  (FLONASE ) 50 MCG/ACT nasal spray Place 2 sprays into both nostrils daily. SPRAY 2 SPRAYS INTO EACH NOSTRIL EVERY DAY   furosemide  (LASIX ) 40 MG tablet TAKE 1 TABLET BY MOUTH EVERY DAY   gabapentin  (NEURONTIN ) 100 MG capsule Take 1-3 capsules (100-300 mg total) by mouth at bedtime.   glimepiride  (AMARYL ) 2 MG tablet TAKE 1 TABLET (2 MG TOTAL) BY MOUTH SEE ADMIN INSTRUCTIONS. TAKE 1 MG DAILY, MAY INCREASE TO 2 MG IF BLOOD SUGAR IS OVER 175   hydrALAZINE  (APRESOLINE ) 100 MG tablet Take 1 tablet (100 mg total) by mouth 2 (two) times daily.   HYDROcodone -acetaminophen  (NORCO/VICODIN) 5-325 MG tablet Take 1 tablet by mouth 3 (three) times daily as needed for severe pain (pain score 7-10). Must last 30 days   hydroxychloroquine  (PLAQUENIL ) 200 MG tablet Take 200 mg by mouth daily.   labetalol  (NORMODYNE ) 300 MG tablet TAKE 1 TABLET BY MOUTH TWICE A DAY   levocetirizine (XYZAL ) 5 MG tablet TAKE 1 TABLET BY MOUTH EVERY DAY IN THE EVENING   losartan  (COZAAR ) 100 MG tablet TAKE 1 TABLET BY MOUTH EVERY DAY IN THE MORNING   methocarbamol  (ROBAXIN ) 500 MG tablet TAKE 1 TABLET BY MOUTH EVERY 6 HOURS AS NEEDED FOR MUSCLE SPASMS.   nitroGLYCERIN  (NITROSTAT ) 0.6 MG SL tablet TAKE 1 TABLET BY MOUTH AS NEEDED FOR CP. WAIT 5 MINUTES BEFORE NEXT DOSE. PROCEED TO ER IF NO  RELIEF AFTER 3 DOSES   ondansetron  (ZOFRAN ) 4 MG tablet Take 1 tablet (4 mg total) by mouth every 8 (eight) hours as needed for nausea or vomiting.   pantoprazole (PROTONIX) 40 MG tablet Take 40 mg by mouth.   pantoprazole (PROTONIX) 40 MG tablet TAKE 1 TABLET BY MOUTH EVERY DAY   rosuvastatin  (CRESTOR ) 40 MG tablet TAKE 1 TABLET BY MOUTH EVERY DAY   senna (SENOKOT) 8.6 MG TABS tablet Take 1 tablet (8.6 mg total) by mouth daily as needed for mild constipation.   SYMBICORT 80-4.5 MCG/ACT inhaler INHALE 1 PUFF BY MOUTH TWICE A DAY   traMADol  (ULTRAM ) 50 MG tablet Take 1 tablet (50 mg total) by mouth every 6 (six) hours as needed for moderate pain (pain score 4-6).   tretinoin (RETIN-A) 0.05 % cream APPLY TO AFFECTED AREA EVERY DAY AT BEDTIME   zolpidem  (AMBIEN ) 5 MG tablet TAKE 1 TABLET BY MOUTH AT BEDTIME AS NEEDED FOR SLEEP   No facility-administered medications prior to visit.    Review of Systems  Constitutional: Negative.   HENT: Negative.    Eyes: Negative.   Respiratory: Negative.  Negative for shortness of breath.   Cardiovascular: Negative.  Negative for chest pain.  Gastrointestinal: Negative.  Negative for abdominal pain, constipation and diarrhea.  Genitourinary: Negative.   Musculoskeletal:  Negative for joint pain and myalgias.  Skin: Negative.   Neurological:  Positive for numbness. Negative for dizziness, tingling and headaches.  Endo/Heme/Allergies: Negative.   All other systems reviewed and are negative.      Objective:   BP 138/78   Pulse 70   Ht 5' 3 (1.6 m)   Wt 180 lb (81.6  kg)   SpO2 98%   BMI 31.89 kg/m   Vitals:   03/15/24 1433  BP: 138/78  Pulse: 70  Height: 5' 3 (1.6 m)  Weight: 180 lb (81.6 kg)  SpO2: 98%  BMI (Calculated): 31.89    Physical Exam Vitals and nursing note reviewed.  Constitutional:      Appearance: Normal appearance. She is normal weight.  HENT:     Head: Normocephalic and atraumatic.     Nose: Nose normal.      Mouth/Throat:     Mouth: Mucous membranes are moist.  Eyes:     Extraocular Movements: Extraocular movements intact.     Conjunctiva/sclera: Conjunctivae normal.     Pupils: Pupils are equal, round, and reactive to light.  Cardiovascular:     Rate and Rhythm: Normal rate and regular rhythm.     Pulses: Normal pulses.     Heart sounds: Normal heart sounds.  Pulmonary:     Effort: Pulmonary effort is normal.     Breath sounds: Normal breath sounds.  Abdominal:     General: Abdomen is flat. Bowel sounds are normal.     Palpations: Abdomen is soft.  Musculoskeletal:        General: Normal range of motion.     Cervical back: Normal range of motion.  Skin:    General: Skin is warm and dry.  Neurological:     General: No focal deficit present.     Mental Status: She is alert and oriented to person, place, and time.  Psychiatric:        Mood and Affect: Mood normal.        Behavior: Behavior normal.        Thought Content: Thought content normal.        Judgment: Judgment normal.      No results found for any visits on 03/15/24.  Recent Results (from the past 2160 hours)  Allergen Profile, Mold     Status: None   Collection Time: 01/17/24  2:27 PM  Result Value Ref Range   Class Description Allergens Comment     Comment:     Levels of Specific IgE       Class  Description of Class     ---------------------------  -----  --------------------                    < 0.10         0         Negative            0.10 -    0.31         0/I       Equivocal/Low            0.32 -    0.55         I         Low            0.56 -    1.40         II        Moderate            1.41 -    3.90         III       High            3.91 -   19.00         IV        Very High  19.01 -  100.00         V         Very High                   >100.00         VI        Very High    Penicillium Chrysogen IgE <0.10 Class 0 kU/L   Cladosporium Herbarum IgE <0.10 Class 0 kU/L   Aspergillus Fumigatus  IgE <0.10 Class 0 kU/L   Mucor Racemosus IgE <0.10 Class 0 kU/L   Candida Albicans IgE <0.10 Class 0 kU/L   Alternaria Alternata IgE <0.10 Class 0 kU/L   Setomelanomma Rostrat <0.10 Class 0 kU/L   M009-IgE Fusarium proliferatum <0.10 Class 0 kU/L   Stemphylium Herbarum IgE <0.10 Class 0 kU/L   Aureobasidi Pullulans IgE <0.10 Class 0 kU/L   Phoma Betae IgE <0.10 Class 0 kU/L   M014-IgE Epicoccum purpur <0.10 Class 0 kU/L  ECHOCARDIOGRAM COMPLETE     Status: None   Collection Time: 01/30/24  4:44 PM  Result Value Ref Range   AR max vel 2.13 cm2   AV Peak grad 10.8 mmHg   Ao pk vel 1.64 m/s   S' Lateral 4.51 cm   Area-P 1/2 2.60 cm2   AV Area VTI 2.06 cm2   AV Mean grad 6.0 mmHg   AV Area mean vel 1.99 cm2   Est EF 55 - 60%   POCT CBG (Fasting - Glucose)     Status: Abnormal   Collection Time: 03/06/24  2:13 PM  Result Value Ref Range   Glucose Fasting, POC 320 (A) 70 - 99 mg/dL  CBC with Diff     Status: Abnormal   Collection Time: 03/06/24  3:19 PM  Result Value Ref Range   WBC 8.0 3.4 - 10.8 x10E3/uL   RBC 5.43 (H) 3.77 - 5.28 x10E6/uL   Hemoglobin 14.5 11.1 - 15.9 g/dL   Hematocrit 54.9 65.9 - 46.6 %   MCV 83 79 - 97 fL   MCH 26.7 26.6 - 33.0 pg   MCHC 32.2 31.5 - 35.7 g/dL   RDW 84.7 88.2 - 84.5 %   Platelets 370 150 - 450 x10E3/uL   Neutrophils 57 Not Estab. %   Lymphs 30 Not Estab. %   Monocytes 10 Not Estab. %   Eos 2 Not Estab. %   Basos 1 Not Estab. %   Neutrophils Absolute 4.6 1.4 - 7.0 x10E3/uL   Lymphocytes Absolute 2.4 0.7 - 3.1 x10E3/uL   Monocytes Absolute 0.8 0.1 - 0.9 x10E3/uL   EOS (ABSOLUTE) 0.2 0.0 - 0.4 x10E3/uL   Basophils Absolute 0.1 0.0 - 0.2 x10E3/uL   Immature Granulocytes 0 Not Estab. %   Immature Grans (Abs) 0.0 0.0 - 0.1 x10E3/uL  CMP14+EGFR     Status: Abnormal   Collection Time: 03/06/24  3:19 PM  Result Value Ref Range   Glucose 76 70 - 99 mg/dL   BUN 7 (L) 8 - 27 mg/dL   Creatinine, Ser 8.83 (H) 0.57 - 1.00 mg/dL   eGFR 49 (L)  >40 fO/fpw/8.26   BUN/Creatinine Ratio 6 (L) 12 - 28   Sodium 138 134 - 144 mmol/L   Potassium 3.8 3.5 - 5.2 mmol/L   Chloride 102 96 - 106 mmol/L   CO2 24 20 - 29 mmol/L   Calcium  9.7 8.7 - 10.3 mg/dL   Total Protein 7.0 6.0 - 8.5 g/dL  Albumin  4.5 3.8 - 4.8 g/dL   Globulin, Total 2.5 1.5 - 4.5 g/dL   Bilirubin Total 0.6 0.0 - 1.2 mg/dL   Alkaline Phosphatase 75 49 - 135 IU/L    Comment:               **Please note reference interval change**   AST 15 0 - 40 IU/L   ALT 11 0 - 32 IU/L  Lipid Panel w/o Chol/HDL Ratio     Status: None   Collection Time: 03/06/24  3:19 PM  Result Value Ref Range   Cholesterol, Total 131 100 - 199 mg/dL   Triglycerides 30 0 - 149 mg/dL   HDL 60 >60 mg/dL   VLDL Cholesterol Cal 8 5 - 40 mg/dL   LDL Chol Calc (NIH) 63 0 - 99 mg/dL  Hemoglobin J8r     Status: Abnormal   Collection Time: 03/06/24  3:19 PM  Result Value Ref Range   Hgb A1c MFr Bld 5.9 (H) 4.8 - 5.6 %    Comment:          Prediabetes: 5.7 - 6.4          Diabetes: >6.4          Glycemic control for adults with diabetes: <7.0    Est. average glucose Bld gHb Est-mCnc 123 mg/dL  RFE85+ZHQM     Status: Abnormal   Collection Time: 03/08/24  2:59 PM  Result Value Ref Range   Glucose 93 70 - 99 mg/dL   BUN 62 (H) 8 - 27 mg/dL   Creatinine, Ser 6.26 (H) 0.57 - 1.00 mg/dL   eGFR 12 (L) >40 fO/fpw/8.26   BUN/Creatinine Ratio 17 12 - 28   Sodium 145 (H) 134 - 144 mmol/L   Potassium 5.5 (H) 3.5 - 5.2 mmol/L   Chloride 113 (H) 96 - 106 mmol/L   CO2 17 (L) 20 - 29 mmol/L   Calcium  9.4 8.7 - 10.3 mg/dL   Total Protein 6.2 6.0 - 8.5 g/dL   Albumin  4.1 3.8 - 4.8 g/dL   Globulin, Total 2.1 1.5 - 4.5 g/dL   Bilirubin Total 0.6 0.0 - 1.2 mg/dL   Alkaline Phosphatase 75 49 - 135 IU/L    Comment:               **Please note reference interval change**   AST 12 0 - 40 IU/L   ALT 10 0 - 32 IU/L  CBC with Differential/Platelet     Status: Abnormal   Collection Time: 03/08/24  2:59 PM  Result  Value Ref Range   WBC 9.4 3.4 - 10.8 x10E3/uL   RBC 3.09 (L) 3.77 - 5.28 x10E6/uL   Hemoglobin 9.1 (L) 11.1 - 15.9 g/dL   Hematocrit 70.6 (L) 65.9 - 46.6 %   MCV 95 79 - 97 fL   MCH 29.4 26.6 - 33.0 pg   MCHC 31.1 (L) 31.5 - 35.7 g/dL   RDW 86.9 88.2 - 84.5 %   Platelets 177 150 - 450 x10E3/uL   Neutrophils 82 Not Estab. %   Lymphs 13 Not Estab. %   Monocytes 5 Not Estab. %   Eos 0 Not Estab. %   Basos 0 Not Estab. %   Neutrophils Absolute 7.7 (H) 1.4 - 7.0 x10E3/uL   Lymphocytes Absolute 1.3 0.7 - 3.1 x10E3/uL   Monocytes Absolute 0.5 0.1 - 0.9 x10E3/uL   EOS (ABSOLUTE) 0.0 0.0 - 0.4 x10E3/uL   Basophils Absolute 0.0 0.0 - 0.2  x10E3/uL   Immature Granulocytes 0 Not Estab. %   Immature Grans (Abs) 0.0 0.0 - 0.1 x10E3/uL  Lipid Panel w/o Chol/HDL Ratio     Status: Abnormal   Collection Time: 03/08/24  2:59 PM  Result Value Ref Range   Cholesterol, Total 205 (H) 100 - 199 mg/dL   Triglycerides 877 0 - 149 mg/dL   HDL 54 >60 mg/dL   VLDL Cholesterol Cal 22 5 - 40 mg/dL   LDL Chol Calc (NIH) 870 (H) 0 - 99 mg/dL  Hemoglobin J8r     Status: Abnormal   Collection Time: 03/08/24  2:59 PM  Result Value Ref Range   Hgb A1c MFr Bld 5.9 (H) 4.8 - 5.6 %    Comment:          Prediabetes: 5.7 - 6.4          Diabetes: >6.4          Glycemic control for adults with diabetes: <7.0    Est. average glucose Bld gHb Est-mCnc 123 mg/dL  ANA, IFA (with reflex)     Status: Abnormal   Collection Time: 03/08/24  3:06 PM  Result Value Ref Range   ANA Titer 1 Positive (A)     Comment:                                      Negative   <1:80                                      Borderline  1:80                                      Positive   >1:80   FANA Staining Patterns     Status: None   Collection Time: 03/08/24  3:06 PM  Result Value Ref Range   Speckled Pattern 1:80     Comment: ICAP nomenclature: AC-2,4,5,29   Note: Comment     Comment: Pattern              Potential Disease  Association -------------  --------------------------------------------- Homogeneous    Systemic Lupus Erythematosus, Drug Induced                Systemic Lupus Erythematosus, Chronic                Autoimmune hepatitis, Juvenile Idiopathic                Arthritis -------------  --------------------------------------------- Speckled       Sjogren Syndrome, Systemic Lupus                Erythematosus, Subacute Cutaneous Lupus,                Neonatal Lupus, Congenital Heart Block,                Mixed Connective Tissue Disease,                Scleroderma-diffuse, Scleroderma-Autoimmune                Myositis Overlap Syndrome, Systemic Lupus                Erythematosus-Scleroderma-Autoimmune  Myositis Overlap Syndrome, Systemic                Autoimmune Rheumatic Disease,                Undifferentiated Connective Tissue Disease -------------  --------------------------------------------- Nucleolar      Systemic Huetter lerosis, Scleroderma-Autoimmune                Myositis Overlap Syndrome, Sjogren                Syndrome, Raynaud phenomenon, Pulmonary                Arterial Hypertension, Systemic Autoimmune                Rheumatic Disease, Cancer -------------  --------------------------------------------- Centromere     Scleroderma-CREST, Limited Cutaneous SSc,                Raynaud's Phenomenon, Primary Biliary                Cholangitis -------------  --------------------------------------------- Nuclear Dot    Primary Biliary Cholangitis -------------  --------------------------------------------- Nuclear        Primary Biliary Cholangitis, Autoimmune Membrane       Hepatitis/Liver disease, Systemic Autoimmune                Rheumatic Disease, Autoimmune Cytopenias,                Linear Scleroderma, Antiphospholipid Syndrome -------------  ---------------------------------------------       Assessment & Plan:  Right foot xray Elevate Ice Ankle  brace  Problem List Items Addressed This Visit       Other   Right foot pain - Primary   Relevant Orders   DG Foot Complete Right    Return if symptoms worsen or fail to improve, for as scheduled with NK.   Total time spent: 25 minutes  Google, NP  03/15/2024   This document may have been prepared by Dragon Voice Recognition software and as such may include unintentional dictation errors.

## 2024-03-16 ENCOUNTER — Other Ambulatory Visit: Payer: Self-pay | Admitting: Gastroenterology

## 2024-03-20 ENCOUNTER — Encounter: Payer: Self-pay | Admitting: Internal Medicine

## 2024-03-20 ENCOUNTER — Ambulatory Visit: Payer: Self-pay | Admitting: Internal Medicine

## 2024-03-20 ENCOUNTER — Ambulatory Visit: Admitting: Internal Medicine

## 2024-03-20 VITALS — BP 150/88 | HR 72 | Ht 63.0 in | Wt 179.8 lb

## 2024-03-20 DIAGNOSIS — N184 Chronic kidney disease, stage 4 (severe): Secondary | ICD-10-CM | POA: Diagnosis not present

## 2024-03-20 DIAGNOSIS — E1165 Type 2 diabetes mellitus with hyperglycemia: Secondary | ICD-10-CM

## 2024-03-20 DIAGNOSIS — I152 Hypertension secondary to endocrine disorders: Secondary | ICD-10-CM

## 2024-03-20 DIAGNOSIS — E782 Mixed hyperlipidemia: Secondary | ICD-10-CM

## 2024-03-20 DIAGNOSIS — D631 Anemia in chronic kidney disease: Secondary | ICD-10-CM

## 2024-03-20 DIAGNOSIS — M79671 Pain in right foot: Secondary | ICD-10-CM

## 2024-03-20 DIAGNOSIS — S92354A Nondisplaced fracture of fifth metatarsal bone, right foot, initial encounter for closed fracture: Secondary | ICD-10-CM | POA: Diagnosis not present

## 2024-03-20 DIAGNOSIS — E1169 Type 2 diabetes mellitus with other specified complication: Secondary | ICD-10-CM

## 2024-03-20 LAB — POCT CBG (FASTING - GLUCOSE)-MANUAL ENTRY: Glucose Fasting, POC: 190 mg/dL — AB (ref 70–99)

## 2024-03-20 NOTE — Progress Notes (Signed)
 Established Patient Office Visit  Subjective:  Patient ID: [name], female    DOB: Aug 24, 1945  Age: 78 y.o. MRN: [id number]  Chief Complaint  Patient presents with   Follow-up    2 week follow up    Patient is here today to follow up on right foot pain. She was recommended  to brace right foot/ankle but patient did not purchase a brace. She denies taking anything for pain at this time besides her chronic pain medications prescribed by her pain specialist. Reports still in pain and having difficulty bearing weight. Patient reports she was able to drive herself here today. She reports she has been doing ice/heat and elevation treatments. Recommended patient to go to [facility] walk in clinic as her foot xray from 5 days ago has not been read yet.  Nephrologist second opinion consult was last week; he wants further labs prior to determining treatment options. Has not taken BP meds yet today as she woke up around noon; her BP is elevated. Otherwise patient is doing okay and has no additional complaints.    No other concerns at this time.   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 b.)07/18/2006 -endovas oblit complex ACOM aneur. c.) Endovas Tx of 6.8x18mm RMCA aneur. d.) 3.5x12mm remnant RMCA aneur 2/2 coil compaction. e.) Interval 3.2x3.38mm saccular outpouching in ACOM c/w mild recannulization in neck. 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 g.) 3.7x34mm remnant of prev Tx'd ACOM aneur -failed embol 02/24/2012.   CKD (chronic kidney disease), stage IV (HCC)    a.) solitary functioning kidney on the RIGHT   Complication of anesthesia    a.) delayed emergence   Coronary artery disease 02/23/2007   a.) LHC 02/23/2007 --> EF 50%; 30% pLAD, 70% mRCA --> planned for staged PCI. b.) PCI 02/27/2007: EF 60%; 3.5 x 15 mm Vision BMS to 80% mRCA.  b.) LHC 07/24/2007: EF 60%; minor irregs; no occlusive CAD; no intervention. c.) LHC 10/28/2010: EF 60%; 30% mLAD, LCx with minor luminal irregs, 20% ISR mRCA; no intervention. d.) LHC 05/31/2013: EF 60%; 40% mLAD, 30% ISR m-dRCA; no interventions.   DDD (degenerative disc disease), cervical    a.) s/p ACDF C5-C7; hardware in neck; patient appreciates stiffness and issues with mobility   Diastolic dysfunction    a.)  TTE 07/07/2021: EF 87.8%; normal PASP; trace TR/MR; G1DD.   Elevated LEFT hemidiaphragm    Gout    Headache(784.0)    Heart murmur    HLD (hyperlipidemia)    HOH (hard of hearing)    Has hearing aids, doesn't wear   Hyperkalemia    Hyperparathyroidism due to renal insufficiency    Hypertension    Incomplete right bundle branch block (RBBB)    Insomnia    a.) takes zolpidem    Long term (current) use of immunomodulator    a.) on DMARD therapy (hydroxychloroquine ) for RA/SLE Dx.   Lumbar adjacent segment disease with spondylolisthesis    Multiple acquired cysts of kidney    On chronic clopidogrel  therapy    OSA (obstructive sleep apnea)    a.) does NOT use nocturnal pap therapy   Osteoarthritis    Pancreatic cyst    Peripheral vascular disease    Pneumonia    Post-COVID chronic cough    Pulmonary nodule    RBBB (right bundle  branch block)    Rectal bleeding    Rheumatoid arthritis (HCC)    a.) on DMARD; hydroxychloriquine   Shortness of breath 03/01/2023   Systemic lupus erythematosus (HCC)    T2DM (type 2 diabetes mellitus) (HCC)    Tendinitis of left wrist    Thickened endometrium    Thickened endometrium    Wears dentures    partial lower    Past Surgical History:  Procedure Laterality Date   ANEURYSM COILING  08/2013   ANTERIOR LATERAL LUMBAR FUSION WITH PERCUTANEOUS SCREW 1 LEVEL N/A 03/09/2023   Procedure: L3-4 LATERAL LUMBAR INTERBODY FUSION;  Surgeon: Clois Fret, MD;  Location: [facility];  Service: Neurosurgery;  Laterality: N/A;   APPLICATION  OF INTRAOPERATIVE CT SCAN N/A 03/09/2023   Procedure: APPLICATION OF INTRAOPERATIVE CT SCAN;  Surgeon: Clois Fret, MD;  Location: [facility];  Service: Neurosurgery;  Laterality: N/A;   Attempted embolization of previously treated ACOM aneurysm; unsuccessful N/A 01/28/2012   Location: [facility]   BACK SURGERY     BARTHOLIN CYST MARSUPIALIZATION     BICEPT TENODESIS Right 03/02/2022   Procedure: BICEPS TENODESIS;  Surgeon: Edie Norleen PARAS, MD;  Location: [facility];  Service: Orthopedics;  Laterality: Right;   BREAST CYST ASPIRATION Right 01/25/2012   FNA neg.   BREAST EXCISIONAL BIOPSY Left 06/26/2007   neg   BUNIONECTOMY Bilateral 2022   CARDIAC CATHETERIZATION Left 02/23/2007   Procedure: CARDIAC CATHETERIZATION; Location: [facility]; Surgeon: Denyse Bathe, MD   CARDIAC CATHETERIZATION Left 07/31/2007   Procedure: CARDIAC CATHETERIZATION; Location: [facility]; Surgeon: Denyse Bathe, MD   CARDIAC CATHETERIZATION Left 10/28/2010   Procedure: CARDIAC CATHETERIZATION; Location: [facility]; Surgeon: Denyse Bathe, MD   CARDIAC CATHETERIZATION Left 05/31/2013   Procedure: CARDIAC CATHETERIZATION; Location: [facility]; Surgeon: Denyse Bathe, MD   Carotid arteriogram; 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 N/A 07/18/2006   Location: [facility]   CATARACT EXTRACTION     CERVICAL FUSION N/A    Procedure: ACDF C5-C7   COLONOSCOPY WITH PROPOFOL  N/A 10/04/2019   Procedure: COLONOSCOPY WITH BIOPSY;  Surgeon: Unk Corinn Skiff, MD;  Location: [facility];  Service: Endoscopy;  Laterality: N/A;  Diabetic (borderline) - oral meds priority 3   CORONARY STENT INTERVENTION Left 02/27/2007   Procedure: CORONARY STENT INTERVENTION (3.5 x 15 mm Vision BMS to mRCA); Location: [facility]; Surgeon: Deatrice Cage, MD   Endovascular near complete obliteration of enlarging neck remnant of previously treated RIGHT MCA aneurysm using stent assisted coiling N/A 09/03/2010    Location: [facility]   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 N/A 10/03/2006   Location: [facility]   HYSTEROSCOPY WITH D & C N/A 08/04/2021   Procedure: DILATATION AND CURETTAGE /HYSTEROSCOPY;  Surgeon: Victor Claudell SAUNDERS, MD;  Location: [facility];  Service: Gynecology;  Laterality: N/A;   POLYPECTOMY N/A 10/04/2019   Procedure: POLYPECTOMY;  Surgeon: Unk Corinn Skiff, MD;  Location: [facility];  Service: Endoscopy;  Laterality: N/A;   REVERSE SHOULDER ARTHROPLASTY Right 03/02/2022   Procedure: REVERSE SHOULDER ARTHROPLASTY;  Surgeon: Edie Norleen PARAS, MD;  Location: [facility];  Service: Orthopedics;  Laterality: Right;   UPPER ESOPHAGEAL ENDOSCOPIC ULTRASOUND (EUS) N/A 04/15/2016   Procedure: UPPER ESOPHAGEAL ENDOSCOPIC ULTRASOUND (EUS);  Surgeon: Asberry DELENA Coffee, MD;  Location: [facility];  Service: Gastroenterology;  Laterality: N/A;    Social History   Socioeconomic History   Marital status: Widowed    Spouse name: Not on file   Number  of children: Not on file   Years of education: Not on file   Highest education level: Not on file  Occupational History   Not on file  Tobacco Use   Smoking status: Never    Passive exposure: Never   Smokeless tobacco: Never  Vaping Use   Vaping status: Never Used  Substance and Sexual Activity   Alcohol use: Yes    Comment: Occasionally at christmas   Drug use: No   Sexual activity: Not Currently    Birth control/protection: Post-menopausal  Other Topics Concern   Not on file  Social History Narrative   Lives with daughter   Social Drivers of Health   Financial Resource Strain: Low Risk  (02/01/2024)   Overall Financial Resource Strain (CARDIA)    Difficulty of Paying Living Expenses: Not hard at all  Food Insecurity: No Food Insecurity (02/01/2024)   Hunger Vital Sign    Worried About Running Out of Food in the Last Year: Never true    Ran Out of Food in the Last  Year: Never true  Transportation Needs: No Transportation Needs (02/01/2024)   PRAPARE - Administrator, Civil Service (Medical): No    Lack of Transportation (Non-Medical): No  Physical Activity: Insufficiently Active (02/01/2024)   Exercise Vital Sign    Days of Exercise per Week: 2 days    Minutes of Exercise per Session: 30 min  Stress: No Stress Concern Present (10/11/2023)   [employer] of Occupational Health - Occupational Stress Questionnaire    Feeling of Stress : Not at all  Social Connections: Moderately Isolated (10/11/2023)   Social Connection and Isolation Panel    Frequency of Communication with Friends and Family: More than three times a week    Frequency of Social Gatherings with Friends and Family: More than three times a week    Attends Religious Services: Never    Database administrator or Organizations: Yes    Attends Banker Meetings: Never    Marital Status: Widowed  Intimate Partner Violence: Not At Risk (02/01/2024)   Humiliation, Afraid, Rape, and Kick questionnaire    Fear of Current or Ex-Partner: No    Emotionally Abused: No    Physically Abused: No    Sexually Abused: No    Family History  Problem Relation Age of Onset   Breast cancer Paternal Aunt 7   Breast cancer Paternal Aunt    Heart disease Mother    Heart disease Father    Stroke Father    Diabetes Sister    Diabetes Brother     Allergies  Allergen Reactions   Isosorbide Dinitrate Other (See Comments)    Collapse   Amlodipine Swelling   Sulfa Antibiotics Itching   Tizanidine Hcl    Oxycodone  Itching   Sulfasalazine Itching    Outpatient Medications Prior to Visit  Medication Sig   albuterol  (VENTOLIN  HFA) 108 (90 Base) MCG/ACT inhaler Inhale 2 puffs into the lungs every 6 (six) hours as needed for wheezing or shortness of breath.   allopurinol  (ZYLOPRIM ) 100 MG tablet TAKE 1 TABLET BY MOUTH EVERY DAY   aspirin  EC 81 MG tablet Take 1 tablet (81 mg  total) by mouth daily. Swallow whole.   calcitRIOL (ROCALTROL) 0.25 MCG capsule Take 0.25 mcg by mouth.   citalopram  (CELEXA ) 10 MG tablet TAKE 1 TABLET BY MOUTH EVERY DAY   clotrimazole -betamethasone (LOTRISONE) cream APPLY TWICE A DAY TO GROIN AREA SKIN FOR 1 WEEK AND THEN  AS NEEDED.   Cyanocobalamin (B-12 PO) Take by mouth.   ezetimibe  (ZETIA ) 10 MG tablet Take 1 tablet (10 mg total) by mouth daily.   fluticasone  (FLONASE ) 50 MCG/ACT nasal spray Place 2 sprays into both nostrils daily. SPRAY 2 SPRAYS INTO EACH NOSTRIL EVERY DAY   furosemide  (LASIX ) 40 MG tablet TAKE 1 TABLET BY MOUTH EVERY DAY   gabapentin  (NEURONTIN ) 100 MG capsule Take 1-3 capsules (100-300 mg total) by mouth at bedtime.   glimepiride  (AMARYL ) 2 MG tablet TAKE 1 TABLET (2 MG TOTAL) BY MOUTH SEE ADMIN INSTRUCTIONS. TAKE 1 MG DAILY, MAY INCREASE TO 2 MG IF BLOOD SUGAR IS OVER 175   hydrALAZINE  (APRESOLINE ) 100 MG tablet Take 1 tablet (100 mg total) by mouth 2 (two) times daily.   hydroxychloroquine  (PLAQUENIL ) 200 MG tablet Take 200 mg by mouth daily.   labetalol  (NORMODYNE ) 300 MG tablet TAKE 1 TABLET BY MOUTH TWICE A DAY   levocetirizine (XYZAL ) 5 MG tablet TAKE 1 TABLET BY MOUTH EVERY DAY IN THE EVENING   losartan  (COZAAR ) 100 MG tablet TAKE 1 TABLET BY MOUTH EVERY DAY IN THE MORNING   methocarbamol  (ROBAXIN ) 500 MG tablet TAKE 1 TABLET BY MOUTH EVERY 6 HOURS AS NEEDED FOR MUSCLE SPASMS.   nitroGLYCERIN  (NITROSTAT ) 0.6 MG SL tablet TAKE 1 TABLET BY MOUTH AS NEEDED FOR CP. WAIT 5 MINUTES BEFORE NEXT DOSE. PROCEED TO ER IF NO RELIEF AFTER 3 DOSES   ondansetron  (ZOFRAN ) 4 MG tablet Take 1 tablet (4 mg total) by mouth every 8 (eight) hours as needed for nausea or vomiting.   pantoprazole (PROTONIX) 40 MG tablet Take 40 mg by mouth.   pantoprazole (PROTONIX) 40 MG tablet TAKE 1 TABLET BY MOUTH EVERY DAY   rosuvastatin  (CRESTOR ) 40 MG tablet TAKE 1 TABLET BY MOUTH EVERY DAY   senna (SENOKOT) 8.6 MG TABS tablet Take 1 tablet (8.6  mg total) by mouth daily as needed for mild constipation.   SYMBICORT 80-4.5 MCG/ACT inhaler INHALE 1 PUFF BY MOUTH TWICE A DAY   traMADol  (ULTRAM ) 50 MG tablet Take 1 tablet (50 mg total) by mouth every 6 (six) hours as needed for moderate pain (pain score 4-6).   tretinoin (RETIN-A) 0.05 % cream APPLY TO AFFECTED AREA EVERY DAY AT BEDTIME   zolpidem  (AMBIEN ) 5 MG tablet TAKE 1 TABLET BY MOUTH AT BEDTIME AS NEEDED FOR SLEEP   No facility-administered medications prior to visit.    Review of Systems  Constitutional: Negative.  Negative for chills, fever and malaise/fatigue.  HENT: Negative.  Negative for congestion and sore throat.   Eyes: Negative.  Negative for blurred vision and pain.  Respiratory: Negative.  Negative for cough and shortness of breath.   Cardiovascular: Negative.  Negative for chest pain, palpitations and leg swelling.  Gastrointestinal: Negative.  Negative for abdominal pain, blood in stool, constipation, diarrhea, heartburn, melena, nausea and vomiting.  Genitourinary: Negative.  Negative for dysuria, flank pain, frequency and urgency.  Musculoskeletal:  Positive for joint pain (right foot located 4-5th metatrasal). Negative for myalgias.  Skin: Negative.   Neurological: Negative.  Negative for dizziness, tingling, sensory change, weakness and headaches.  Endo/Heme/Allergies: Negative.   Psychiatric/Behavioral: Negative.  Negative for depression and suicidal ideas. The patient is not nervous/anxious.        Objective:   BP (!) 150/88   Pulse 72   Ht 5' 3 (1.6 m)   Wt 179 lb 12.8 oz (81.6 kg)   SpO2 98%   BMI 31.85 kg/m  Vitals:   03/20/24 1318  BP: (!) 150/88  Pulse: 72  Height: 5' 3 (1.6 m)  Weight: 179 lb 12.8 oz (81.6 kg)  SpO2: 98%  BMI (Calculated): 31.86    Physical Exam Vitals and nursing note reviewed.  Constitutional:      Appearance: Normal appearance.  HENT:     Head: Normocephalic and atraumatic.     Nose: Nose normal.      Mouth/Throat:     Mouth: Mucous membranes are moist.     Pharynx: Oropharynx is clear.  Eyes:     Conjunctiva/sclera: Conjunctivae normal.     Pupils: Pupils are equal, round, and reactive to light.  Cardiovascular:     Rate and Rhythm: Normal rate and regular rhythm.     Pulses: Normal pulses.     Heart sounds: Normal heart sounds. No murmur heard. Pulmonary:     Effort: Pulmonary effort is normal.     Breath sounds: Normal breath sounds. No wheezing.  Abdominal:     General: Bowel sounds are normal.     Palpations: Abdomen is soft.     Tenderness: There is no abdominal tenderness. There is no right CVA tenderness or left CVA tenderness.  Musculoskeletal:        General: Normal range of motion.     Cervical back: Normal range of motion.     Right lower leg: No edema.     Left lower leg: No edema.  Skin:    General: Skin is warm and dry.     Findings: Bruising (right foot near 3-5 metatarsal; improving per patient) and signs of injury (patient seen 9/25 for fall.) present.  Neurological:     General: No focal deficit present.     Mental Status: She is alert and oriented to person, place, and time.  Psychiatric:        Mood and Affect: Mood normal.        Behavior: Behavior normal.      Results for orders placed or performed in visit on 03/20/24  POCT CBG (Fasting - Glucose)  Result Value Ref Range   Glucose Fasting, POC 190 (A) 70 - 99 mg/dL    Recent Results (from the past 2160 hours)  Allergen Profile, Mold     Status: None   Collection Time: 01/17/24  2:27 PM  Result Value Ref Range   Class Description Allergens Comment     Comment:     Levels of Specific IgE       Class  Description of Class     ---------------------------  -----  --------------------                    < 0.10         0         Negative            0.10 -    0.31         0/I       Equivocal/Low            0.32 -    0.55         I         Low            0.56 -    1.40         II        Moderate             1.41 -  3.90         III       High            3.91 -   19.00         IV        Very High           19.01 -  100.00         V         Very High                   >100.00         VI        Very High    Penicillium Chrysogen IgE <0.10 Class 0 kU/L   Cladosporium Herbarum IgE <0.10 Class 0 kU/L   Aspergillus Fumigatus IgE <0.10 Class 0 kU/L   Mucor Racemosus IgE <0.10 Class 0 kU/L   Candida Albicans IgE <0.10 Class 0 kU/L   Alternaria Alternata IgE <0.10 Class 0 kU/L   Setomelanomma Rostrat <0.10 Class 0 kU/L   M009-IgE Fusarium proliferatum <0.10 Class 0 kU/L   Stemphylium Herbarum IgE <0.10 Class 0 kU/L   Aureobasidi Pullulans IgE <0.10 Class 0 kU/L   Phoma Betae IgE <0.10 Class 0 kU/L   [id number] Epicoccum purpur <0.10 Class 0 kU/L  ECHOCARDIOGRAM COMPLETE     Status: None   Collection Time: 01/30/24  4:44 PM  Result Value Ref Range   AR max vel 2.13 cm2   AV Peak grad 10.8 mmHg   Ao pk vel 1.64 m/s   S' Lateral 4.51 cm   Area-P 1/2 2.60 cm2   AV Area VTI 2.06 cm2   AV Mean grad 6.0 mmHg   AV Area mean vel 1.99 cm2   Est EF 55 - 60%   POCT CBG (Fasting - Glucose)     Status: Abnormal   Collection Time: 03/06/24  2:13 PM  Result Value Ref Range   Glucose Fasting, POC 320 (A) 70 - 99 mg/dL  CBC with Diff     Status: Abnormal   Collection Time: 03/06/24  3:19 PM  Result Value Ref Range   WBC 8.0 3.4 - 10.8 x10E3/uL   RBC 5.43 (H) 3.77 - 5.28 x10E6/uL   Hemoglobin 14.5 11.1 - 15.9 g/dL   Hematocrit 54.9 65.9 - 46.6 %   MCV 83 79 - 97 fL   MCH 26.7 26.6 - 33.0 pg   MCHC 32.2 31.5 - 35.7 g/dL   RDW 84.7 88.2 - 84.5 %   Platelets 370 150 - 450 x10E3/uL   Neutrophils 57 Not Estab. %   Lymphs 30 Not Estab. %   Monocytes 10 Not Estab. %   Eos 2 Not Estab. %   Basos 1 Not Estab. %   Neutrophils Absolute 4.6 1.4 - 7.0 x10E3/uL   Lymphocytes Absolute 2.4 0.7 - 3.1 x10E3/uL   Monocytes Absolute 0.8 0.1 - 0.9 x10E3/uL   EOS (ABSOLUTE) 0.2 0.0 - 0.4 x10E3/uL    Basophils Absolute 0.1 0.0 - 0.2 x10E3/uL   Immature Granulocytes 0 Not Estab. %   Immature Grans (Abs) 0.0 0.0 - 0.1 x10E3/uL  CMP14+EGFR     Status: Abnormal   Collection Time: 03/06/24  3:19 PM  Result Value Ref Range   Glucose 76 70 - 99 mg/dL   BUN 7 (L) 8 - 27 mg/dL   Creatinine, Ser 8.83 (H) 0.57 - 1.00 mg/dL   eGFR 49 (L) >40 fO/fpw/8.26  BUN/Creatinine Ratio 6 (L) 12 - 28   Sodium 138 134 - 144 mmol/L   Potassium 3.8 3.5 - 5.2 mmol/L   Chloride 102 96 - 106 mmol/L   CO2 24 20 - 29 mmol/L   Calcium  9.7 8.7 - 10.3 mg/dL   Total Protein 7.0 6.0 - 8.5 g/dL   Albumin  4.5 3.8 - 4.8 g/dL   Globulin, Total 2.5 1.5 - 4.5 g/dL   Bilirubin Total 0.6 0.0 - 1.2 mg/dL   Alkaline Phosphatase 75 49 - 135 IU/L    Comment:               **Please note reference interval change**   AST 15 0 - 40 IU/L   ALT 11 0 - 32 IU/L  Lipid Panel w/o Chol/HDL Ratio     Status: None   Collection Time: 03/06/24  3:19 PM  Result Value Ref Range   Cholesterol, Total 131 100 - 199 mg/dL   Triglycerides 30 0 - 149 mg/dL   HDL 60 >60 mg/dL   VLDL Cholesterol Cal 8 5 - 40 mg/dL   LDL Chol Calc (NIH) 63 0 - 99 mg/dL  Hemoglobin J8r     Status: Abnormal   Collection Time: 03/06/24  3:19 PM  Result Value Ref Range   Hgb A1c MFr Bld 5.9 (H) 4.8 - 5.6 %    Comment:          Prediabetes: 5.7 - 6.4          Diabetes: >6.4          Glycemic control for adults with diabetes: <7.0    Est. average glucose Bld gHb Est-mCnc 123 mg/dL  RFE85+ZHQM     Status: Abnormal   Collection Time: 03/08/24  2:59 PM  Result Value Ref Range   Glucose 93 70 - 99 mg/dL   BUN 62 (H) 8 - 27 mg/dL   Creatinine, Ser 6.26 (H) 0.57 - 1.00 mg/dL   eGFR 12 (L) >40 fO/fpw/8.26   BUN/Creatinine Ratio 17 12 - 28   Sodium 145 (H) 134 - 144 mmol/L   Potassium 5.5 (H) 3.5 - 5.2 mmol/L   Chloride 113 (H) 96 - 106 mmol/L   CO2 17 (L) 20 - 29 mmol/L   Calcium  9.4 8.7 - 10.3 mg/dL   Total Protein 6.2 6.0 - 8.5 g/dL   Albumin  4.1 3.8 - 4.8  g/dL   Globulin, Total 2.1 1.5 - 4.5 g/dL   Bilirubin Total 0.6 0.0 - 1.2 mg/dL   Alkaline Phosphatase 75 49 - 135 IU/L    Comment:               **Please note reference interval change**   AST 12 0 - 40 IU/L   ALT 10 0 - 32 IU/L  CBC with Differential/Platelet     Status: Abnormal   Collection Time: 03/08/24  2:59 PM  Result Value Ref Range   WBC 9.4 3.4 - 10.8 x10E3/uL   RBC 3.09 (L) 3.77 - 5.28 x10E6/uL   Hemoglobin 9.1 (L) 11.1 - 15.9 g/dL   Hematocrit 70.6 (L) 65.9 - 46.6 %   MCV 95 79 - 97 fL   MCH 29.4 26.6 - 33.0 pg   MCHC 31.1 (L) 31.5 - 35.7 g/dL   RDW 86.9 88.2 - 84.5 %   Platelets 177 150 - 450 x10E3/uL   Neutrophils 82 Not Estab. %   Lymphs 13 Not Estab. %   Monocytes 5 Not Estab. %  Eos 0 Not Estab. %   Basos 0 Not Estab. %   Neutrophils Absolute 7.7 (H) 1.4 - 7.0 x10E3/uL   Lymphocytes Absolute 1.3 0.7 - 3.1 x10E3/uL   Monocytes Absolute 0.5 0.1 - 0.9 x10E3/uL   EOS (ABSOLUTE) 0.0 0.0 - 0.4 x10E3/uL   Basophils Absolute 0.0 0.0 - 0.2 x10E3/uL   Immature Granulocytes 0 Not Estab. %   Immature Grans (Abs) 0.0 0.0 - 0.1 x10E3/uL  Lipid Panel w/o Chol/HDL Ratio     Status: Abnormal   Collection Time: 03/08/24  2:59 PM  Result Value Ref Range   Cholesterol, Total 205 (H) 100 - 199 mg/dL   Triglycerides 877 0 - 149 mg/dL   HDL 54 >60 mg/dL   VLDL Cholesterol Cal 22 5 - 40 mg/dL   LDL Chol Calc (NIH) 870 (H) 0 - 99 mg/dL  Hemoglobin J8r     Status: Abnormal   Collection Time: 03/08/24  2:59 PM  Result Value Ref Range   Hgb A1c MFr Bld 5.9 (H) 4.8 - 5.6 %    Comment:          Prediabetes: 5.7 - 6.4          Diabetes: >6.4          Glycemic control for adults with diabetes: <7.0    Est. average glucose Bld gHb Est-mCnc 123 mg/dL  ANA, IFA (with reflex)     Status: Abnormal   Collection Time: 03/08/24  3:06 PM  Result Value Ref Range   ANA Titer 1 Positive (A)     Comment:                                      Negative   <1:80                                       Borderline  1:80                                      Positive   >1:80   FANA Staining Patterns     Status: None   Collection Time: 03/08/24  3:06 PM  Result Value Ref Range   Speckled Pattern 1:80     Comment: ICAP nomenclature: AC-2,4,5,29   Note: Comment     Comment: Pattern              Potential Disease Association -------------  --------------------------------------------- Homogeneous    Systemic Lupus Erythematosus, Drug Induced                Systemic Lupus Erythematosus, Chronic                Autoimmune hepatitis, Juvenile Idiopathic                Arthritis -------------  --------------------------------------------- Speckled       Sjogren Syndrome, Systemic Lupus                Erythematosus, Subacute Cutaneous Lupus,                Neonatal Lupus, Congenital Heart Block,                Mixed Connective Tissue Disease,  Scleroderma-diffuse, Scleroderma-Autoimmune                Myositis Overlap Syndrome, Systemic Lupus                Erythematosus-Scleroderma-Autoimmune                Myositis Overlap Syndrome, Systemic                Autoimmune Rheumatic Disease,                Undifferentiated Connective Tissue Disease -------------  --------------------------------------------- Nucleolar      Systemic Wilkinson lerosis, Scleroderma-Autoimmune                Myositis Overlap Syndrome, Sjogren                Syndrome, Raynaud phenomenon, Pulmonary                Arterial Hypertension, Systemic Autoimmune                Rheumatic Disease, Cancer -------------  --------------------------------------------- Centromere     Scleroderma-CREST, Limited Cutaneous SSc,                Raynaud's Phenomenon, Primary Biliary                Cholangitis -------------  --------------------------------------------- Nuclear Dot    Primary Biliary Cholangitis -------------  --------------------------------------------- Nuclear        Primary Biliary Cholangitis,  Autoimmune Membrane       Hepatitis/Liver disease, Systemic Autoimmune                Rheumatic Disease, Autoimmune Cytopenias,                Linear Scleroderma, Antiphospholipid Syndrome -------------  ---------------------------------------------   POCT CBG (Fasting - Glucose)     Status: Abnormal   Collection Time: 03/20/24  1:23 PM  Result Value Ref Range   Glucose Fasting, POC 190 (A) 70 - 99 mg/dL      Assessment & Plan:  Continue taking medications as prescribed. Recommended visit to [facility] walk in clinic for further evaluation on right foot pain. Keep specialists appointments as recommended. Problem List Items Addressed This Visit     Type 2 diabetes mellitus with hyperglycemia, without long-term current use of insulin  (HCC) - Primary   Relevant Orders   POCT CBG (Fasting - Glucose) (Completed)   Hypertension associated with diabetes (HCC)   Absolute anemia   Combined hyperlipidemia associated with type 2 diabetes mellitus (HCC)   Chronic kidney disease, stage IV (severe) (HCC)   Right foot pain    Return in about 3 months (around 06/19/2024).   Total time spent: 30 minutes  FERNAND FREDY RAMAN, MD  03/20/2024   This document may have been prepared by [facility] Voice Recognition software and as such may include unintentional dictation errors.

## 2024-03-21 DIAGNOSIS — S92354A Nondisplaced fracture of fifth metatarsal bone, right foot, initial encounter for closed fracture: Secondary | ICD-10-CM | POA: Diagnosis not present

## 2024-03-22 ENCOUNTER — Ambulatory Visit: Payer: Self-pay | Admitting: Cardiology

## 2024-03-27 ENCOUNTER — Encounter: Payer: Self-pay | Admitting: Student in an Organized Health Care Education/Training Program

## 2024-03-27 ENCOUNTER — Other Ambulatory Visit: Payer: Self-pay | Admitting: Family

## 2024-03-27 DIAGNOSIS — F5101 Primary insomnia: Secondary | ICD-10-CM

## 2024-03-28 ENCOUNTER — Ambulatory Visit
Attending: Student in an Organized Health Care Education/Training Program | Admitting: Student in an Organized Health Care Education/Training Program

## 2024-03-28 DIAGNOSIS — M961 Postlaminectomy syndrome, not elsewhere classified: Secondary | ICD-10-CM

## 2024-03-28 DIAGNOSIS — G894 Chronic pain syndrome: Secondary | ICD-10-CM | POA: Diagnosis not present

## 2024-03-28 DIAGNOSIS — M47816 Spondylosis without myelopathy or radiculopathy, lumbar region: Secondary | ICD-10-CM | POA: Diagnosis not present

## 2024-03-28 NOTE — Progress Notes (Signed)
 PROVIDER NOTE: Interpretation of information contained herein should be left to medically-trained personnel. Specific patient instructions are provided elsewhere under Patient Instructions section of medical record. This document was created in part using AI and STT-dictation technology, any transcriptional errors that may result from this process are unintentional.  Patient: [name]  Service: E/M   PCP: Cathy Fredy RAMAN, MD  DOB: [birth year]  DOS: 03/28/2024  Provider: Wallie Sherry, MD  MRN: [id number]  Delivery: Virtual Visit  Specialty: Interventional Pain Management  Type: Established Patient  Setting: Ambulatory outpatient facility  Specialty designation: 09  Referring Prov.: Cathy Fredy RAMAN, MD  Location: Remote location       Virtual Encounter - Pain Management PROVIDER NOTE: Information contained herein reflects review and annotations entered in association with encounter. Interpretation of such information and data should be left to medically-trained personnel. Information provided to patient can be located elsewhere in the medical record under Patient Instructions. Document created using STT-dictation technology, any transcriptional errors that may result from process are unintentional.    Contact & Pharmacy Preferred: [phone] Home: [phone] (home) Mobile: [phone] (mobile) E-mail: [email or web address]  [id number] [address] [place], [place] - [address] [place] [address] Phone: [phone] Fax: [phone]   Pre-screening  [name] offered in-person vs virtual encounter. She indicated preferring virtual for this encounter.   Reason COVID-19*  Social distancing based on CDC and AMA recommendations.   I contacted [name] on 03/28/2024 via telephone.      I clearly identified myself as Cathy Sherry, MD. I verified that I was speaking with the correct person using two identifiers (Name: [name], and date  of birth: 1945-09-05).  Consent I sought verbal advanced consent from [name] for virtual visit interactions. I informed [name] of possible security and privacy concerns, risks, and limitations associated with providing not-in-person medical evaluation and management services. I also informed [name] of the availability of in-person appointments. Finally, I informed her that there would be a charge for the virtual visit and that she could be  personally, fully or partially, financially responsible for it. [name] expressed understanding and agreed to proceed.   Historic Elements   [name] is a 78 y.o. year old, female patient evaluated today after our last contact on 03/13/2024. [name]  has a past medical history of Anemia of chronic renal disease, Arthritis, Cerebral aneurysm (06/07/2006), CKD (chronic kidney disease), stage IV (HCC), Complication of anesthesia, Coronary artery disease (02/23/2007), DDD (degenerative disc disease), cervical, Diastolic dysfunction, Elevated LEFT hemidiaphragm, Gout, Headache(784.0), Heart murmur, HLD (hyperlipidemia), HOH (hard of hearing), Hyperkalemia, Hyperparathyroidism due to renal insufficiency, Hypertension, Incomplete right bundle branch block (RBBB), Insomnia, Long term (current) use of immunomodulator, Lumbar adjacent segment disease with spondylolisthesis, Multiple acquired cysts of kidney, On chronic clopidogrel  therapy, OSA (obstructive sleep apnea), Osteoarthritis, Pancreatic cyst, Peripheral vascular disease, Pneumonia, Post-COVID chronic cough, Pulmonary nodule, RBBB (right bundle branch block), Rectal bleeding, Rheumatoid arthritis (HCC), Shortness of breath (03/01/2023), Systemic lupus erythematosus (HCC), T2DM (type 2 diabetes mellitus) (HCC), Tendinitis of left wrist, Thickened endometrium, Thickened endometrium, and Wears dentures. She also  has a past surgical history that includes Cataract extraction;  Cervical fusion (N/A); Cardiac catheterization (Left, 02/23/2007); Aneurysm coiling (08/2013); Back surgery; Bartholin cyst marsupialization; Upper esophageal endoscopic ultrasound (eus) (N/A, 04/15/2016); Breast cyst aspiration (Right, 01/25/2012); Colonoscopy with propofol  (N/A, 10/04/2019); polypectomy (N/A, 10/04/2019); Breast excisional biopsy (Left, 06/26/2007); Bunionectomy (Bilateral, 2022); CORONARY STENT INTERVENTION (Left, 02/27/2007);  Cardiac catheterization (Left, 07/31/2007); Cardiac catheterization (Left, 10/28/2010); Cardiac catheterization (Left, 05/31/2013); Carotid arteriogram; 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 (N/A, 07/18/2006); 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 [name] has a current medication list which includes the following prescription(s): albuterol , allopurinol , aspirin  ec, calcitriol, citalopram , clotrimazole -betamethasone, cyanocobalamin, ezetimibe , fluticasone , furosemide , gabapentin , glimepiride , hydralazine , hydroxychloroquine , labetalol , levocetirizine, losartan , methocarbamol , nitroglycerin , ondansetron , pantoprazole, pantoprazole, rosuvastatin , senna, symbicort, tramadol , tretinoin, and zolpidem . She  reports that she has never smoked. She has never been exposed to tobacco smoke. She has never used smokeless tobacco. She reports current alcohol use. She reports that she  does not use drugs. [name] is allergic to isosorbide dinitrate, amlodipine, sulfa antibiotics, tizanidine hcl, and oxycodone .  BMI: Estimated body mass index is 31.85 kg/m as calculated from the following:   Height as of 03/20/24: 5' 3 (1.6 m).   Weight as of 03/20/24: 179 lb 12.8 oz (81.6 kg). Last encounter: 02/14/2024. Last procedure: 03/07/2024.  HPI  Today, she is being contacted for a post-procedure assessment.  Post-Procedure Evaluation   Type: Lumbar Facet, Medial Branch Block(s) (w/ fluoroscopic mapping) #1  Laterality: Bilateral  Level: L4, L5, and S1 Medial Branch/Dorsal Rami Level(s). Injecting these levels blocks the L4-5 and L5-S1 lumbar facet joints. Imaging: Fluoroscopic guidance Spinal ([id number]) Anesthesia: Local anesthesia (1-2% Lidocaine ) DOS: 03/07/2024 Performed by: Cathy Sherry, MD  Primary Purpose: Diagnostic/Therapeutic Indications: Low back pain severe enough to impact quality of life or function. 1. Lumbar facet arthropathy   2. Failed back surgical syndrome   3. Chronic pain syndrome    NAS-11 Pain score:   Pre-procedure: 9 /10   Post-procedure: 9 /10     Effectiveness:  Initial hour after procedure: 100 %  Subsequent 4-6 hours post-procedure: 100 %  Analgesia past initial 6 hours: 80% for 5 days Ongoing improvement:  Analgesic:  80% for 5 days then decrease to 50% Function: Somewhat improved ROM: Somewhat improved   Monitoring: [place] PMP: PDMP reviewed during this encounter.       Pharmacotherapy: No side-effects or adverse reactions reported. Compliance: No problems identified. Effectiveness: Clinically acceptable. Plan: Refer to POC.  UDS:  Summary  Date Value Ref Range Status  08/04/2023 FINAL  Final    Comment:    ==================================================================== Compliance Drug Analysis, Ur ==================================================================== Test                             Result        Flag       Units  Drug Present and Declared for Prescription Verification   Gabapentin                      PRESENT      EXPECTED   Methocarbamol                   PRESENT      EXPECTED   Zolpidem                        PRESENT  EXPECTED   Zolpidem  Acid                  PRESENT      EXPECTED    Zolpidem  acid is an expected metabolite of zolpidem .  Drug Absent but Declared for Prescription Verification   Tramadol                        Not Detected UNEXPECTED ng/mg creat ==================================================================== Test                      Result    Flag   Units      Ref Range   Creatinine              187              mg/dL      >=79 ==================================================================== Declared Medications:  The flagging and interpretation on this report are based on the  following declared medications.  Unexpected results may arise from  inaccuracies in the declared medications.   **Note: The testing scope of this panel includes these medications:   Gabapentin  (Neurontin )  Methocarbamol  (Robaxin )  Tramadol    **Note: The testing scope of this panel does not include small to  moderate amounts of these reported medications:   Zolpidem  (Ambien )   **Note: The testing scope of this panel does not include the  following reported medications:   Allopurinol  (Zyloprim )  Betamethasone (Lotrisone)  Budesonide (Symbicort)  Clotrimazole  (Lotrisone)  Fluticasone  (Flonase )  Formoterol  (Symbicort)  Furosemide  (Lasix )  Glimepiride  (Amaryl )  Hydralazine  (Apresoline )  Hydroxychloroquine  (Plaquenil )  Losartan  (Cozaar )  Nitroglycerin  (Nitrostat )  Normodyne   Pantoprazole (Protonix)  Rosuvastatin  (Crestor )  Sennosides (Senokot)  Sodium Zirconium Cyclosilicate (Lokelma)  Tretinoin (Retin-A) ==================================================================== For clinical consultation, please call [phone]. ====================================================================    No results found for: CBDTHCR, D8THCCBX, D9THCCBX  Laboratory Chemistry Profile   Renal Lab Results  Component Value Date   BUN 62 (H) 03/08/2024   CREATININE 3.73 (H) 03/08/2024   BCR 17 03/08/2024   GFRAA 27 (L) 09/11/2019   GFRNONAA 15 (L) 12/05/2023    Hepatic Lab Results  Component Value Date   AST 12 03/08/2024   ALT 10 03/08/2024   ALBUMIN  4.1 03/08/2024   ALKPHOS 75 03/08/2024   LIPASE 35 03/13/2023    Electrolytes Lab Results  Component Value Date   NA 145 (H) 03/08/2024   K 5.5 (H) 03/08/2024   CL 113 (H) 03/08/2024   CALCIUM  9.4 03/08/2024   MG 1.9 05/31/2013    Bone No results found for: VD25OH, VD125OH2TOT, CI6874NY7, CI7874NY7, 25OHVITD1, 25OHVITD2, 25OHVITD3, TESTOFREE, TESTOSTERONE  Inflammation (CRP: Acute Phase) (ESR: Chronic Phase) No results found for: CRP, ESRSEDRATE, LATICACIDVEN       Note: Above Lab results reviewed.  Imaging  CLINICAL DATA:  Provided history: Lumbar adjacent segment disease with spondylolisthesis. Status post lumbar fusion. Low back pain, prior surgery, new symptoms. Additional history provided by the scanning technologist: the patient reports having lumbar spine surgery three months ago, persistent pain.   EXAM: MRI LUMBAR SPINE WITHOUT CONTRAST   TECHNIQUE: Multiplanar, multisequence MR imaging of the lumbar spine was performed. No intravenous contrast was administered.   COMPARISON:  Lumbar spine CT 06/09/2023. Lumbar spine radiographs 05/31/2023 and 04/20/2023. Lumbar spine MRI 11/25/2022.   FINDINGS: Segmentation: 5 lumbar vertebrae. The caudal most well-formed intervertebral disc space is designated L5-S1.   Alignment: 2 mm T12-L1 grade 1 retrolisthesis. Slight L5-S1 grade 1 anterolisthesis.  Vertebrae: Susceptibility artifact arising from a posterior spinal fusion construct spanning the L2-L5  levels, and from interbody devices at L3-L4 and L4-L5. No significant marrow edema. Large hemangioma again noted within the T11 vertebral body. L3 vertebral compression fracture (with up to 60% height loss), chronic but new from the prior lumbar spine MRI of 11/25/2022.   Conus medullaris and cauda equina: Conus extends to the L1-L2 level. No signal abnormality identified within the visualized distal spinal cord.   Paraspinal and other soft tissues: No acute finding within included portions of the abdomen/retroperitoneum. Known pancreatic head lesion, renal cysts and left renal atrophy. Postsurgical changes to the paraspinal soft tissues.   Disc levels:   Unless otherwise stated, the level by level findings below have not significantly changed from the prior MRI of 11/25/2022.   Disc degeneration at the non-operative levels, greatest at L1-L2 (moderate-to-advanced at this level.   T10-T11: This level is imaged in the sagittal plane only. Slight disc bulge. Facet arthropathy and ligamentum flavum hypertrophy. No significant disc herniation or stenosis.   T11-T12: This level is imaged in the sagittal plane only. Slight disc bulge. Facet arthropathy. No significant spinal canal or foraminal stenosis.   T12-L1: Disc bulge. Mild facet arthropathy with ligamentum flavum hypertrophy. Small right facet joint effusion. A disc bulge mildly effaces the ventral thecal sac. No significant foraminal stenosis.   L1-L2: Disc bulge. Endplate osteophytes along the posterior and right aspects of the disc space. Facet and ligamentum flavum hypertrophy. Mild effacement of the ventral thecal sac. No significant foraminal stenosis.   L2-L3: New from the prior MRI, a posterior spinal fusion construct spans this level. Better appreciated on the same-day lumbar spine CT, there is lucency surrounding the left L2 pedicle screw and this screw extends slightly through the L2 superior endplate. Small  disc bulge. Moderate facet arthropathy with ligamentum flavum hypertrophy. No significant spinal canal stenosis or neural foraminal narrowing.   L3-L4: Interval posterior decompression and posterior spinal fusion. Disc bulge. Posterior element and ligamentum flavum hypertrophy. Mild left subarticular narrowing, improved. Mild-to-moderate central canal stenosis, improved. Mild left neural foraminal narrowing, improved.   L4-L5: Prior post decompression and posterior spinal fusion. Vertebral body and facet ankylosis. Endplate osteophytes mildly efface the ventral thecal sac. Mild osseous neural foraminal narrowing bilaterally.   L5-S1: Chronic facet ankylosis bilaterally, as well as a degree of osseous fusion across the posterior aspect of the disc space. Posterior element hypertrophy results in mild left subarticular narrowing. No significant central canal stenosis or neural foraminal narrowing.   IMPRESSION: 1. Comparison is made to the prior lumbar spine MRI of 11/25/2022. 2. 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 3. At L2-L3, there has been interval posterior fusion. Better appreciated on the same day lumbar spine CT, there is lucency surrounding the left L2 pedicle screw compatible with loosening, and this screw extends slightly through the L2 superior endplate. 4. At L3-L4, there has been interval posterior decompression and posterior spinal fusion. Multifactorial mild-to-moderate central canal stenosis, improved. Mild left subarticular stenosis, improved. Mild left neural foraminal stenosis, improved. 5. No more than mild central canal/subarticular or neural foraminal stenosis at the remaining levels. 6. 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 7. L3 vertebral compression fracture (with up to 60% height loss), chronic but new  from the prior MRI.   DG Foot Complete Right CLINICAL DATA:  Forefoot pain, bruising and  swelling following injury 8 days prior.  EXAM: RIGHT FOOT COMPLETE - 3+ VIEW  COMPARISON:  None Available.  FINDINGS: The bones appear mildly demineralized. There is a nondisplaced fracture through the base of the 5th metatarsal which demonstrates no definite proximal intra-articular extension. No other evidence of acute fracture or dislocation. There are postsurgical changes from previous arthrodesis of the 1st metatarsophalangeal joint with 2 cannulated screws. The hardware is intact without loosening. Ankylosis appears solid. Mild midfoot degenerative changes are noted. There are small bidirectional calcaneal spurs. There is soft tissue swelling dorsally in the midfoot and forefoot.  IMPRESSION: 1. Nondisplaced fracture through the base of the 5th metatarsal. 2. Postsurgical changes from previous 1st MTP joint arthrodesis.  Electronically Signed   By: Elsie Perone M.D.   On: 03/21/2024 12:56  Assessment  The primary encounter diagnosis was Lumbar facet arthropathy. Diagnoses of Failed back surgical syndrome and Chronic pain syndrome were also pertinent to this visit.  Plan of Care  The patient is status post positive diagnostic lumbar medial branch nerve block (Block #1) at bilateral L4, L5, S1 levels, with significant temporary pain relief, consistent with facet-mediated low back pain.  We will proceed with a second diagnostic medial branch block at the same levels to confirm the diagnosis, as per standard protocol. If the patient experiences similar positive response with Block #2, we will then consider proceeding with lumbar medial branch radiofrequency ablation (RFA) for longer-term pain relief.  The patient was counseled on the rationale, goals, and expected outcomes of repeat diagnostic blocks and RFA, and agrees with the plan. Follow-up will be scheduled  accordingly.   Orders:  Orders Placed This Encounter  Procedures   LUMBAR FACET(MEDIAL BRANCH NERVE BLOCK) MBNB    Diagnosis: Lumbar Facet Syndrome (M47.816); Lumbosacral Facet Syndrome (M47.817); Lumbar Facet Joint Pain (M54.59) Medical Necessity Statement: 1.Severe chronic axial low back pain causing functional impairment documented by ongoing pain scale assessments. 2.Pain present for longer than 3 months (Chronic) documented to have failed noninvasive conservative therapies. 3.Absence of untreated radiculopathy. 4.There is no radiological evidence of untreated fractures, tumor, infection, or deformity.  Physical Examination Findings: Positive Kemp Maneuver: (Y)  Positive Lumbar Hyperextension-Rotation provocative test: (Y)    Standing Status:   Future    Expiration Date:   06/28/2024    Scheduling Instructions:     Procedure: Lumbar facet Block     Type: Medial Branch Block     Side: Bilateral     Purpose: Diagnostic Radiologic Mapping     Level(s): L4-5, L5-S1, and TBD by Fluoroscopic Mapping Facets (L4, L5, S1 Medial Branch)     Sedation: Patient's choice.     Timeframe: As soon as schedule allows.    Where will this procedure be performed?:   [facility]   Follow-up plan:   Return in about 3 weeks (around 04/18/2024) for B/L L4, 5, S1 MBNB#2, in clinic NS.      B/L Cluneal NB 08/24/23, B/L L4,5, S1 MBNB 03/07/24      Recent Visits Date Type Provider Dept  03/07/24 Procedure visit Marcelino Nurse, MD [facility]  02/14/24 Office Visit Marcelino Nurse, MD [facility]  Showing recent visits within past 90 days and meeting all other requirements Today's Visits Date Type Provider Dept  03/28/24 Office Visit Marcelino Nurse, MD [facility]  Showing today's visits and meeting all other requirements Future Appointments No visits were found meeting these conditions. Showing future appointments within next 90 days and meeting  all other  requirements  I discussed the assessment and treatment plan with the patient. The patient was provided an opportunity to ask questions and all were answered. The patient agreed with the plan and demonstrated an understanding of the instructions.  Patient advised to call back or seek an in-person evaluation if the symptoms or condition worsens.  Duration of encounter: 15 minutes.  Note by: Cathy Sherry, MD Date: 03/28/2024; Time: 3:03 PM

## 2024-03-28 NOTE — Progress Notes (Unsigned)
 Cardiology Office Note    Date:  03/29/2024   ID:  [name], DOB 20-Nov-1945, MRN [id number]  PCP:  Fernand Fredy RAMAN, MD  Cardiologist:  Deatrice Cage, MD  Electrophysiologist:  None   Chief Complaint: Follow-up  History of Present Illness:   [name] is a 78 y.o. 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  She was previously followed by Dr. Fernand, establishing care with Dr. Cage in 12/2023.  She underwent PCI/DES to the mid RCA in 2008 by Dr. Cage.  Following that, she had cardiac catheterizations in 2009, 2012, and 2014; all of them showing patent stent with mild restenosis.  LAD had mild nonobstructive disease.  Upon establishing care with our office in 12/2023 she reported chest discomfort in 10/2023, though none since.  Functional status was limited by shortness of breath.  She also reported lower extremity swelling.  She reported undergoing back surgery in 2024 as well as bilateral leg pain with walking.  Physical exam demonstrated normal femoral pulses with PAD felt to be unlikely.  Discomfort was suspected to be neuropathic.  Echo in 01/2024 showed an EF of 55 to 60%, no regional wall motion abnormalities, mild LVH, grade 1 diastolic dysfunction, normal RV systolic function and ventricular cavity size, mildly dilated left atrium, mild mitral regurgitation, aortic valve sclerosis without evidence of stenosis, and an estimated right atrial pressure of 3 mmHg.  She comes in doing well from a cardiac perspective and is without symptoms of angina or cardiac decompensation.  Chronic dyspnea is stable.  Since she was last seen she did have 1 mechanical fall, rolling onto her right foot leading to a closed fracture of the fifth metatarsal.  She did  not hit her head or suffer LOC.  No lower extremity swelling or progressive orthopnea.  No symptoms concerning for bleeding.  Not currently taking aspirin .  She did not start ezetimibe  following last visit.  Blood pressure is mildly elevated in the office today though she has not antihypertensive medications for the day yet.  She reports intermittent palpitations described as a heartbeat that will last for several seconds to a minute or 2 occurring every few days.  No associated dizziness, presyncope, or syncope.   Labs independently reviewed: 02/2024 - A1c 5.9, TC 205, TG 122, HDL 54, LDL 129, Hgb 9.1, PLT 177, BUN 62, serum creatinine 3.73, potassium 5.5, albumin  4.1, AST/ALT normal 12/2023 - magnesium  2.1  Past Medical History:  Diagnosis Date   Anemia of chronic renal disease    Arthritis    Cerebral aneurysm 06/07/2006   a.) 6.2x4.65mm and 4.9x4.69mm ACOM & 7x4.63mm RMCA aneur. b.)07/18/2006 -endovas oblit complex ACOM aneur. c.) Endovas Tx of 6.8x54mm RMCA aneur. d.) 3.5x67mm remnant RMCA aneur 2/2 coil compaction. e.) Interval 3.2x3.45mm saccular outpouching in ACOM c/w mild recannulization in neck. 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 g.) 3.7x4mm remnant of prev Tx'd ACOM aneur -failed embol 02/24/2012.   CKD (chronic kidney disease), stage IV (HCC)    a.) solitary functioning kidney on the RIGHT   Complication of anesthesia    a.) delayed emergence   Coronary artery disease 02/23/2007   a.) LHC 02/23/2007 --> EF 50%; 30% pLAD, 70% mRCA --> planned for staged PCI. b.) PCI 02/27/2007: EF  60%; 3.5 x 15 mm Vision BMS to 80% mRCA. b.) LHC 07/24/2007: EF 60%; minor irregs; no occlusive CAD; no intervention. c.) LHC 10/28/2010: EF 60%; 30% mLAD, LCx with minor luminal irregs, 20% ISR mRCA; no intervention. d.) LHC 05/31/2013: EF 60%; 40% mLAD, 30% ISR m-dRCA; no interventions.   DDD (degenerative disc disease), cervical    a.) s/p ACDF C5-C7; hardware in neck; patient appreciates  stiffness and issues with mobility   Diastolic dysfunction    a.)  TTE 07/07/2021: EF 87.8%; normal PASP; trace TR/MR; G1DD.   Elevated LEFT hemidiaphragm    Gout    Headache(784.0)    Heart murmur    HLD (hyperlipidemia)    HOH (hard of hearing)    Has hearing aids, doesn't wear   Hyperkalemia    Hyperparathyroidism due to renal insufficiency    Hypertension    Incomplete right bundle branch block (RBBB)    Insomnia    a.) takes zolpidem    Long term (current) use of immunomodulator    a.) on DMARD therapy (hydroxychloroquine ) for RA/SLE Dx.   Lumbar adjacent segment disease with spondylolisthesis    Multiple acquired cysts of kidney    On chronic clopidogrel  therapy    OSA (obstructive sleep apnea)    a.) does NOT use nocturnal pap therapy   Osteoarthritis    Pancreatic cyst    Peripheral vascular disease    Pneumonia    Post-COVID chronic cough    Pulmonary nodule    RBBB (right bundle branch block)    Rectal bleeding    Rheumatoid arthritis (HCC)    a.) on DMARD; hydroxychloriquine   Shortness of breath 03/01/2023   Systemic lupus erythematosus (HCC)    T2DM (type 2 diabetes mellitus) (HCC)    Tendinitis of left wrist    Thickened endometrium    Thickened endometrium    Wears dentures    partial lower    Past Surgical History:  Procedure Laterality Date   ANEURYSM COILING  08/2013   ANTERIOR LATERAL LUMBAR FUSION WITH PERCUTANEOUS SCREW 1 LEVEL N/A 03/09/2023   Procedure: L3-4 LATERAL LUMBAR INTERBODY FUSION;  Surgeon: Clois Fret, MD;  Location: [facility];  Service: Neurosurgery;  Laterality: N/A;   APPLICATION OF INTRAOPERATIVE CT SCAN N/A 03/09/2023   Procedure: APPLICATION OF INTRAOPERATIVE CT SCAN;  Surgeon: Clois Fret, MD;  Location: [facility];  Service: Neurosurgery;  Laterality: N/A;   Attempted embolization of previously treated ACOM aneurysm; unsuccessful N/A 01/28/2012   Location: [facility]   BACK SURGERY     BARTHOLIN CYST  MARSUPIALIZATION     BICEPT TENODESIS Right 03/02/2022   Procedure: BICEPS TENODESIS;  Surgeon: Edie Norleen PARAS, MD;  Location: [facility];  Service: Orthopedics;  Laterality: Right;   BREAST CYST ASPIRATION Right 01/25/2012   FNA neg.   BREAST EXCISIONAL BIOPSY Left 06/26/2007   neg   BUNIONECTOMY Bilateral 2022   CARDIAC CATHETERIZATION Left 02/23/2007   Procedure: CARDIAC CATHETERIZATION; Location: [facility]; Surgeon: Denyse Bathe, MD   CARDIAC CATHETERIZATION Left 07/31/2007   Procedure: CARDIAC CATHETERIZATION; Location: [facility]; Surgeon: Denyse Bathe, MD   CARDIAC CATHETERIZATION Left 10/28/2010   Procedure: CARDIAC CATHETERIZATION; Location: [facility]; Surgeon: Denyse Bathe, MD   CARDIAC CATHETERIZATION Left 05/31/2013   Procedure: CARDIAC CATHETERIZATION; Location: [facility]; Surgeon: Denyse Bathe, MD   Carotid arteriogram; 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 N/A 07/18/2006   Location[facility]   CATARACT EXTRACTION     CERVICAL FUSION N/A    Procedure:  ACDF C5-C7   COLONOSCOPY WITH PROPOFOL  N/A 10/04/2019   Procedure: COLONOSCOPY WITH BIOPSY;  Surgeon: Unk Corinn Skiff, MD;  Location: [facility];  Service: Endoscopy;  Laterality: N/A;  Diabetic (borderline) - oral meds priority 3   CORONARY STENT INTERVENTION Left 02/27/2007   Procedure: CORONARY STENT INTERVENTION (3.5 x 15 mm Vision BMS to mRCA); Location: [facility]; Surgeon: Deatrice Cage, MD   Endovascular near complete obliteration of enlarging neck remnant of previously treated RIGHT MCA aneurysm using stent assisted coiling N/A 09/03/2010   Location: [facility]   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 N/A 10/03/2006   Location: [facility]   HYSTEROSCOPY WITH D & C N/A 08/04/2021   Procedure: DILATATION AND CURETTAGE /HYSTEROSCOPY;  Surgeon: Victor Claudell SAUNDERS, MD;  Location: [facility];  Service: Gynecology;  Laterality: N/A;    POLYPECTOMY N/A 10/04/2019   Procedure: POLYPECTOMY;  Surgeon: Unk Corinn Skiff, MD;  Location: [facility];  Service: Endoscopy;  Laterality: N/A;   REVERSE SHOULDER ARTHROPLASTY Right 03/02/2022   Procedure: REVERSE SHOULDER ARTHROPLASTY;  Surgeon: Edie Norleen PARAS, MD;  Location: [facility];  Service: Orthopedics;  Laterality: Right;   UPPER ESOPHAGEAL ENDOSCOPIC ULTRASOUND (EUS) N/A 04/15/2016   Procedure: UPPER ESOPHAGEAL ENDOSCOPIC ULTRASOUND (EUS);  Surgeon: Asberry DELENA Coffee, MD;  Location: [facility];  Service: Gastroenterology;  Laterality: N/A;    Current Medications: Current Meds  Medication Sig   albuterol  (VENTOLIN  HFA) 108 (90 Base) MCG/ACT inhaler Inhale 2 puffs into the lungs every 6 (six) hours as needed for wheezing or shortness of breath.   allopurinol  (ZYLOPRIM ) 100 MG tablet TAKE 1 TABLET BY MOUTH EVERY DAY   aspirin  EC 81 MG tablet Take 1 tablet (81 mg total) by mouth daily. Swallow whole.   calcitRIOL (ROCALTROL) 0.25 MCG capsule Take 0.25 mcg by mouth.   citalopram  (CELEXA ) 10 MG tablet TAKE 1 TABLET BY MOUTH EVERY DAY   clotrimazole -betamethasone (LOTRISONE) cream APPLY TWICE A DAY TO GROIN AREA SKIN FOR 1 WEEK AND THEN AS NEEDED.   Cyanocobalamin (B-12 PO) Take by mouth.   ezetimibe  (ZETIA ) 10 MG tablet Take 1 tablet (10 mg total) by mouth daily.   fluticasone  (FLONASE ) 50 MCG/ACT nasal spray Place 2 sprays into both nostrils daily. SPRAY 2 SPRAYS INTO EACH NOSTRIL EVERY DAY   furosemide  (LASIX ) 40 MG tablet TAKE 1 TABLET BY MOUTH EVERY DAY   gabapentin  (NEURONTIN ) 100 MG capsule Take 1-3 capsules (100-300 mg total) by mouth at bedtime.   glimepiride  (AMARYL ) 2 MG tablet TAKE 1 TABLET (2 MG TOTAL) BY MOUTH SEE ADMIN INSTRUCTIONS. TAKE 1 MG DAILY, MAY INCREASE TO 2 MG IF BLOOD SUGAR IS OVER 175   hydrALAZINE  (APRESOLINE ) 100 MG tablet Take 1 tablet (100 mg total) by mouth 2 (two) times daily.   hydroxychloroquine  (PLAQUENIL ) 200 MG tablet Take 200 mg by  mouth daily.   labetalol  (NORMODYNE ) 300 MG tablet TAKE 1 TABLET BY MOUTH TWICE A DAY   levocetirizine (XYZAL ) 5 MG tablet TAKE 1 TABLET BY MOUTH EVERY DAY IN THE EVENING   losartan  (COZAAR ) 100 MG tablet TAKE 1 TABLET BY MOUTH EVERY DAY IN THE MORNING   methocarbamol  (ROBAXIN ) 500 MG tablet TAKE 1 TABLET BY MOUTH EVERY 6 HOURS AS NEEDED FOR MUSCLE SPASMS.   nitroGLYCERIN  (NITROSTAT ) 0.6 MG SL tablet TAKE 1 TABLET BY MOUTH AS NEEDED FOR CP. WAIT 5 MINUTES BEFORE NEXT DOSE. PROCEED TO ER IF NO RELIEF AFTER 3 DOSES   ondansetron  (ZOFRAN )  4 MG tablet Take 1 tablet (4 mg total) by mouth every 8 (eight) hours as needed for nausea or vomiting.   pantoprazole (PROTONIX) 40 MG tablet Take 40 mg by mouth.   pantoprazole (PROTONIX) 40 MG tablet TAKE 1 TABLET BY MOUTH EVERY DAY   rosuvastatin  (CRESTOR ) 40 MG tablet TAKE 1 TABLET BY MOUTH EVERY DAY   senna (SENOKOT) 8.6 MG TABS tablet Take 1 tablet (8.6 mg total) by mouth daily as needed for mild constipation.   SYMBICORT 80-4.5 MCG/ACT inhaler INHALE 1 PUFF BY MOUTH TWICE A DAY   traMADol  (ULTRAM ) 50 MG tablet Take 1 tablet (50 mg total) by mouth every 6 (six) hours as needed for moderate pain (pain score 4-6).   tretinoin (RETIN-A) 0.05 % cream APPLY TO AFFECTED AREA EVERY DAY AT BEDTIME   [DISCONTINUED] zolpidem  (AMBIEN ) 5 MG tablet TAKE 1 TABLET BY MOUTH AT BEDTIME AS NEEDED FOR SLEEP    Allergies:   Isosorbide dinitrate, Amlodipine, Sulfa antibiotics, Tizanidine hcl, and Oxycodone    Social History   Socioeconomic History   Marital status: Widowed    Spouse name: Not on file   Number of children: Not on file   Years of education: Not on file   Highest education level: Not on file  Occupational History   Not on file  Tobacco Use   Smoking status: Never    Passive exposure: Never   Smokeless tobacco: Never  Vaping Use   Vaping status: Never Used  Substance and Sexual Activity   Alcohol use: Yes    Comment: Occasionally at christmas   Drug  use: No   Sexual activity: Not Currently    Birth control/protection: Post-menopausal  Other Topics Concern   Not on file  Social History Narrative   Lives with daughter   Social Drivers of Health   Financial Resource Strain: Low Risk  (02/01/2024)   Overall Financial Resource Strain (CARDIA)    Difficulty of Paying Living Expenses: Not hard at all  Food Insecurity: No Food Insecurity (02/01/2024)   Hunger Vital Sign    Worried About Running Out of Food in the Last Year: Never true    Ran Out of Food in the Last Year: Never true  Transportation Needs: No Transportation Needs (02/01/2024)   PRAPARE - Administrator, Civil Service (Medical): No    Lack of Transportation (Non-Medical): No  Physical Activity: Insufficiently Active (02/01/2024)   Exercise Vital Sign    Days of Exercise per Week: 2 days    Minutes of Exercise per Session: 30 min  Stress: No Stress Concern Present (10/11/2023)   [employer] of Occupational Health - Occupational Stress Questionnaire    Feeling of Stress : Not at all  Social Connections: Moderately Isolated (10/11/2023)   Social Connection and Isolation Panel    Frequency of Communication with Friends and Family: More than three times a week    Frequency of Social Gatherings with Friends and Family: More than three times a week    Attends Religious Services: Never    Database administrator or Organizations: Yes    Attends Banker Meetings: Never    Marital Status: Widowed     Family History:  The patient's family history includes Breast cancer in her paternal aunt; Breast cancer (age of onset: 57) in her paternal aunt; Diabetes in her brother and sister; Heart disease in her father and mother; Stroke in her father.  ROS:   12-point review of systems is  negative unless otherwise noted in the HPI.   EKGs/Labs/Other Studies Reviewed:    Studies reviewed were summarized above. The additional studies were reviewed today:  2D  echo 01/30/2024: 1. Left ventricular ejection fraction, by estimation, is 55 to 60%. The  left ventricle has normal function. The left ventricle has no regional  wall motion abnormalities. There is mild left ventricular hypertrophy.  Left ventricular diastolic parameters  are consistent with Grade I diastolic dysfunction (impaired relaxation).  The average left ventricular global longitudinal strain is -16.9 %. The  global longitudinal strain is abnormal.   2. Right ventricular systolic function is normal. The right ventricular  size is normal.   3. Left atrial size was mildly dilated.   4. The mitral valve is normal in structure. Mild mitral valve  regurgitation.   5. The aortic valve is tricuspid. Aortic valve regurgitation is not  visualized. Aortic valve sclerosis/calcification is present, without any  evidence of aortic stenosis.   6. The inferior vena cava is normal in size with greater than 50%  respiratory variability, suggesting right atrial pressure of 3 mmHg.    EKG:  The EKG ordered on 12/28/2023 demonstrates sinus rhythm with nonspecific lateral ST-T changes  Recent Labs: 03/08/2024: ALT 10; BUN 62; Creatinine, Ser 3.73; Hemoglobin 9.1; Platelets 177; Potassium 5.5; Sodium 145  Recent Lipid Panel    Component Value Date/Time   CHOL 205 (H) 03/08/2024 1459   CHOL 193 05/31/2013 0044   TRIG 122 03/08/2024 1459   TRIG 105 05/31/2013 0044   HDL 54 03/08/2024 1459   HDL 39 (L) 05/31/2013 0044   CHOLHDL 5.8 (H) 12/16/2022 1501   VLDL 21 05/31/2013 0044   LDLCALC 129 (H) 03/08/2024 1459   LDLCALC 133 (H) 05/31/2013 0044    PHYSICAL EXAM:    VS:  BP (!) 156/75 (BP Location: Left Wrist, Patient Position: Sitting, Cuff Size: Normal) Comment: Pt forgot to take med this morning  Pulse 75   Ht 5' 3 (1.6 m)   Wt 181 lb (82.1 kg)   SpO2 98%   BMI 32.06 kg/m   BMI: Body mass index is 32.06 kg/m.  Physical Exam Vitals reviewed.  Constitutional:      Appearance: She is  well-developed.  HENT:     Head: Normocephalic and atraumatic.  Eyes:     General:        Right eye: No discharge.        Left eye: No discharge.  Cardiovascular:     Rate and Rhythm: Normal rate and regular rhythm.     Heart sounds: Normal heart sounds, S1 normal and S2 normal. Heart sounds not distant. No midsystolic click and no opening snap. No murmur heard.    No friction rub.  Pulmonary:     Effort: Pulmonary effort is normal. No respiratory distress.     Breath sounds: Normal breath sounds. No decreased breath sounds, wheezing, rhonchi or rales.  Musculoskeletal:     Cervical back: Normal range of motion.     Left lower leg: No edema.     Comments: Right foot in boot.  Skin:    General: Skin is warm and dry.     Nails: There is no clubbing.  Neurological:     Mental Status: She is alert and oriented to person, place, and time.  Psychiatric:        Speech: Speech normal.        Behavior: Behavior normal.  Thought Content: Thought content normal.        Judgment: Judgment normal.     Wt Readings from Last 3 Encounters:  03/29/24 181 lb (82.1 kg)  03/20/24 179 lb 12.8 oz (81.6 kg)  03/15/24 180 lb (81.6 kg)     ASSESSMENT & PLAN:   CAD involving the native coronary arteries without angina: She is without symptoms of angina or cardiac decompensation.  Chronic dyspnea stable.  She would prefer to defer further ischemic testing at this time.  Recommend she start aspirin  81 mg daily.  HTN: Blood pressure is elevated in the office today however, she has not taken any of her antihypertensive medications.  Recommend she take antihypertensive medications as directed with continued monitoring of blood pressure, if this remains elevated with adherence to pharmacotherapy we may need to consider titration.  HLD: LDL 129 in 02/2024.  Goal less than 70.  She did not resume ezetimibe  following last visit.  Recommend she start ezetimibe  10 mg daily with continuation of  rosuvastatin  40 mg.  She will need follow-up labs in the next several months on pharmacotherapy.  If LDL remains above goal at that time would recommend referral to clinical pharmacist for consideration of PCSK9 inhibitor.  Palpitations: Sounds to be consistent with PVCs.  Placed Zio patch.  Recent echo with preserved LV systolic function.  CKD stage IV: Followed by nephrology.  Avoid nephrotoxic agents.    Disposition: F/u with Dr. Darron or an APP in 3 months.   Medication Adjustments/Labs and Tests Ordered: Current medicines are reviewed at length with the patient today.  Concerns regarding medicines are outlined above. Medication changes, Labs and Tests ordered today are summarized above and listed in the Patient Instructions accessible in Encounters.   Signed, Bernardino Bring, PA-C 03/29/2024 5:05 PM     [facility] [place] [address] [place], [place] [address] [phone]

## 2024-03-29 ENCOUNTER — Ambulatory Visit (INDEPENDENT_AMBULATORY_CARE_PROVIDER_SITE_OTHER)

## 2024-03-29 ENCOUNTER — Encounter: Payer: Self-pay | Admitting: Physician Assistant

## 2024-03-29 ENCOUNTER — Ambulatory Visit: Attending: Physician Assistant | Admitting: Physician Assistant

## 2024-03-29 VITALS — BP 156/75 | HR 75 | Ht 63.0 in | Wt 181.0 lb

## 2024-03-29 DIAGNOSIS — R002 Palpitations: Secondary | ICD-10-CM

## 2024-03-29 DIAGNOSIS — I2510 Atherosclerotic heart disease of native coronary artery without angina pectoris: Secondary | ICD-10-CM | POA: Diagnosis not present

## 2024-03-29 DIAGNOSIS — I10 Essential (primary) hypertension: Secondary | ICD-10-CM

## 2024-03-29 DIAGNOSIS — N184 Chronic kidney disease, stage 4 (severe): Secondary | ICD-10-CM | POA: Diagnosis not present

## 2024-03-29 DIAGNOSIS — E785 Hyperlipidemia, unspecified: Secondary | ICD-10-CM | POA: Diagnosis not present

## 2024-03-29 NOTE — Patient Instructions (Signed)
 Visit Information  Thank you for taking time to visit with me today. Please don't hesitate to contact me if I can be of assistance to you before our next scheduled appointment.  Your next care management appointment is by telephone on 04/09/24 at 1100    Please call the care guide team at [phone] if you need to cancel, schedule, or reschedule an appointment.   Please call the Suicide and Crisis Lifeline: 988 call the [place]  National Suicide Prevention Lifeline: (470)668-5956 or TTY: 936-172-5669 TTY (502)041-3605) to talk to a trained counselor call 1-800-273-TALK (toll free, 24 hour hotline) call 911 if you are experiencing a Mental Health or Behavioral Health Crisis or need someone to talk to.  Gearlene Godsil L. Ramonita, RN, BSN, CCM [facility]  [facility], [facility] RN Care Manager Direct Dial: [phone]  Fax: [phone]

## 2024-03-29 NOTE — Patient Instructions (Signed)
 Medication Instructions:  Your physician recommends that you continue on your current medications as directed. Please refer to the Current Medication list given to you today.   *If you need a refill on your cardiac medications before your next appointment, please call your pharmacy*  Lab Work: None ordered at this time  If you have labs (blood work) drawn today and your tests are completely normal, you will receive your results only by: MyChart Message (if you have MyChart) OR A paper copy in the mail If you have any lab test that is abnormal or we need to change your treatment, we will call you to review the results.  Testing/Procedures: GEOFFRY HEWS- Long Term Monitor Instructions  Your physician has requested you wear a ZIO patch monitor for 14 days.  This is a single patch monitor. [employer] supplies one patch monitor per enrollment. Additional stickers are not available. Please do not apply patch if you will be having a Nuclear Stress Test, Echocardiogram, Cardiac CT, MRI, or Chest Xray during the period you would be wearing the monitor. The patch cannot be worn during these tests. You cannot remove and re-apply the ZIO XT patch monitor.  Your ZIO patch monitor will be mailed 3 day USPS to your address on file. It may take 3-5 days to receive your monitor after you have been enrolled. Once you have received your monitor, please review the enclosed instructions. Your monitor has already been registered assigning a specific monitor serial number to you.  Billing and Patient Assistance Program Information  We have supplied [employer] with any of your insurance information on file for billing purposes.  [employer] offers a sliding scale Patient Assistance Program for patients that do not have insurance, or whose insurance does not completely cover the cost of the ZIO monitor.  You must apply for the Patient Assistance Program to qualify for this discounted rate.  To apply, please call [employer] at [phone],  select option 4, select option 2, ask to apply for Patient Assistance Program. Meredeth will ask your household income, and how many people are in your household. They will quote your out-of-pocket cost based on that information. [employer] will also be able to set up a 10-month, interest-free payment plan if needed.  Applying the monitor   Shave hair from upper left chest.  Hold abrader disc by orange tab. Rub abrader in 40 strokes over the upper left chest as indicated in your monitor instructions.  Clean area with 4 enclosed alcohol pads. Let dry.  Apply patch as indicated in monitor instructions. Patch will be placed under collarbone on left side of chest with arrow pointing upward.  Rub patch adhesive wings for 2 minutes. Remove white label marked 1. Remove the white label marked 2. Rub patch adhesive wings for 2 additional minutes.  While looking in a mirror, press and release button in center of patch. A small green light will flash 3-4 times. This will be your only indicator that the monitor has been turned on.  Do not shower for the first 24 hours. You may shower after the first 24 hours.  Press the button if you feel a symptom. You will hear a small click. Record Date, Time and Symptom in the Patient Logbook.  When you are ready to remove the patch, follow instructions on the last 2 pages of Patient Logbook.  Stick patch monitor into the tabs at the bottom of the return box.  Place Patient Logbook in the blue and white box. Use locking  tab on box and tape box closed securely. The blue and white box has prepaid postage on it. Please place it in the mailbox as soon as possible. Your physician should have your test results approximately 7-14 days after the monitor has been mailed back to [facility].  Call [facility] Customer Care at [phone] if you have questions regarding your ZIO XT patch monitor.  Call them immediately if you see an orange light blinking on your monitor.  If  your monitor falls off in less than 4 days, contact our Monitor department at [phone].  If your monitor becomes loose or falls off after 4 days call [employer] at [phone] for suggestions on securing your monitor.   Follow-Up: At [facility], you and your health needs are our priority.  As part of our continuing mission to provide you with exceptional heart care, our providers are all part of one team.  This team includes your primary Cardiologist (physician) and Advanced Practice Providers or APPs (Physician Assistants and Nurse Practitioners) who all work together to provide you with the care you need, when you need it.  Your next appointment:   3 month(s)  Provider:   You may see Deatrice Cage, MD or Bernardino Bring, PA-C

## 2024-03-30 ENCOUNTER — Encounter (HOSPITAL_COMMUNITY): Payer: Self-pay | Admitting: Gastroenterology

## 2024-03-30 NOTE — Progress Notes (Signed)
 Pre op call [name]      PCPGLENWOOD Bathe MD Cardiologist-Arida MD  Pulmonologist-n/a  EKG-12/28/23 Echo-01/30/24 Cath-n/a Stress-n/a ICD/PM-n/a GLP1-n/a Blood Thinner-n/a  History: DM,OSA, CAD, Murmur,HTN,RBBB,CKD4. Patient saw her pcp 9/30. She saw her cardiologist 10/9 and mentioned some palpitations and they placed order for zio patch. She stated she has palpitations occasionally and they don't bother her but happen every now and then and feel like heart is beating hard. Patient said hasn't received the patch yet but was told by her cardiologist to wait until after procedure to put on. Anesthesia Review- Yes- approved for procedure

## 2024-04-06 ENCOUNTER — Encounter (HOSPITAL_COMMUNITY)
Admission: RE | Disposition: A | Discharge status: Home / Self Care | Payer: Self-pay | Source: Home / Self Care | Attending: Gastroenterology

## 2024-04-06 ENCOUNTER — Ambulatory Visit (HOSPITAL_COMMUNITY): Admitting: Certified Registered Nurse Anesthetist

## 2024-04-06 ENCOUNTER — Encounter (HOSPITAL_COMMUNITY): Payer: Self-pay | Admitting: Gastroenterology

## 2024-04-06 ENCOUNTER — Ambulatory Visit (HOSPITAL_COMMUNITY)
Admission: RE | Admit: 2024-04-06 | Discharge: 2024-04-06 | Disposition: A | Discharge status: Home / Self Care | Attending: Gastroenterology | Admitting: Gastroenterology

## 2024-04-06 ENCOUNTER — Other Ambulatory Visit: Payer: Self-pay

## 2024-04-06 DIAGNOSIS — I4510 Unspecified right bundle-branch block: Secondary | ICD-10-CM | POA: Diagnosis not present

## 2024-04-06 DIAGNOSIS — Z6831 Body mass index (BMI) 31.0-31.9, adult: Secondary | ICD-10-CM | POA: Diagnosis not present

## 2024-04-06 DIAGNOSIS — R0602 Shortness of breath: Secondary | ICD-10-CM | POA: Diagnosis not present

## 2024-04-06 DIAGNOSIS — R519 Headache, unspecified: Secondary | ICD-10-CM | POA: Diagnosis not present

## 2024-04-06 DIAGNOSIS — N184 Chronic kidney disease, stage 4 (severe): Secondary | ICD-10-CM | POA: Diagnosis not present

## 2024-04-06 DIAGNOSIS — E1122 Type 2 diabetes mellitus with diabetic chronic kidney disease: Secondary | ICD-10-CM | POA: Insufficient documentation

## 2024-04-06 DIAGNOSIS — I129 Hypertensive chronic kidney disease with stage 1 through stage 4 chronic kidney disease, or unspecified chronic kidney disease: Secondary | ICD-10-CM | POA: Diagnosis not present

## 2024-04-06 DIAGNOSIS — E669 Obesity, unspecified: Secondary | ICD-10-CM | POA: Insufficient documentation

## 2024-04-06 DIAGNOSIS — E1151 Type 2 diabetes mellitus with diabetic peripheral angiopathy without gangrene: Secondary | ICD-10-CM | POA: Diagnosis not present

## 2024-04-06 DIAGNOSIS — G4730 Sleep apnea, unspecified: Secondary | ICD-10-CM | POA: Insufficient documentation

## 2024-04-06 DIAGNOSIS — K862 Cyst of pancreas: Secondary | ICD-10-CM | POA: Insufficient documentation

## 2024-04-06 DIAGNOSIS — M069 Rheumatoid arthritis, unspecified: Secondary | ICD-10-CM | POA: Diagnosis not present

## 2024-04-06 DIAGNOSIS — I2510 Atherosclerotic heart disease of native coronary artery without angina pectoris: Secondary | ICD-10-CM | POA: Diagnosis not present

## 2024-04-06 DIAGNOSIS — K8020 Calculus of gallbladder without cholecystitis without obstruction: Secondary | ICD-10-CM | POA: Insufficient documentation

## 2024-04-06 DIAGNOSIS — D631 Anemia in chronic kidney disease: Secondary | ICD-10-CM | POA: Insufficient documentation

## 2024-04-06 HISTORY — PX: ESOPHAGOGASTRODUODENOSCOPY: SHX5428

## 2024-04-06 HISTORY — PX: EUS: SHX5427

## 2024-04-06 HISTORY — PX: FINE NEEDLE ASPIRATION: SHX6590

## 2024-04-06 LAB — GLUCOSE, CAPILLARY: Glucose-Capillary: 124 mg/dL — ABNORMAL HIGH (ref 70–99)

## 2024-04-06 SURGERY — ULTRASOUND, UPPER GI TRACT, ENDOSCOPIC
Anesthesia: Monitor Anesthesia Care

## 2024-04-06 MED ORDER — PHENYLEPHRINE 80 MCG/ML (10ML) SYRINGE FOR IV PUSH (FOR BLOOD PRESSURE SUPPORT)
PREFILLED_SYRINGE | INTRAVENOUS | Status: DC | PRN
  Administered 2024-04-06: 80 ug via INTRAVENOUS
  Administered 2024-04-06: 160 ug via INTRAVENOUS
  Administered 2024-04-06: 80 ug via INTRAVENOUS

## 2024-04-06 MED ORDER — PROPOFOL 500 MG/50ML IV EMUL
INTRAVENOUS | Status: DC | PRN
  Administered 2024-04-06: 20 mg via INTRAVENOUS
  Administered 2024-04-06: 30 mg via INTRAVENOUS
  Administered 2024-04-06: 10 mg via INTRAVENOUS
  Administered 2024-04-06: 150 ug/kg/min via INTRAVENOUS

## 2024-04-06 MED ORDER — CIPROFLOXACIN IN D5W 400 MG/200ML IV SOLN
INTRAVENOUS | Status: AC
  Filled 2024-04-06: qty 200

## 2024-04-06 MED ORDER — LIDOCAINE 2% (20 MG/ML) 5 ML SYRINGE
INTRAMUSCULAR | Status: DC | PRN
  Administered 2024-04-06: 100 mg via INTRAVENOUS

## 2024-04-06 MED ORDER — SODIUM CHLORIDE 0.9 % IV SOLN: INTRAVENOUS | Status: DC

## 2024-04-06 MED ORDER — CIPROFLOXACIN IN D5W 400 MG/200ML IV SOLN
INTRAVENOUS | Status: AC | PRN
  Administered 2024-04-06: 400 mg via INTRAVENOUS

## 2024-04-06 NOTE — Transfer of Care (Signed)
 Immediate Anesthesia Transfer of Care Note  Patient: [name]  Procedure(s) Performed: ULTRASOUND, UPPER GI TRACT, ENDOSCOPIC  Patient Location: PACU  Anesthesia Type:MAC  Level of Consciousness: drowsy and responds to stimulation  Airway & Oxygen Therapy: Patient Spontanous Breathing and Patient connected to face mask oxygen  Post-op Assessment: Report given to RN and Post -op Vital signs reviewed and stable  Post vital signs: Reviewed and stable  Last Vitals:  Vitals Value Taken Time  BP 107/52 04/06/24 10:25  Temp 36.2 C 04/06/24 10:25  Pulse 64 04/06/24 10:25  Resp 17 04/06/24 10:25  SpO2 100 % 04/06/24 10:25  Vitals shown include unfiled device data.  Last Pain:  Vitals:   04/06/24 0912  TempSrc: Temporal  PainSc: 5       Patients Stated Pain Goal: 0 (04/06/24 1025)  Complications: No notable events documented.

## 2024-04-06 NOTE — H&P (Signed)
 Muntaha N Loisel HPI: A recent MRCP was perofrmed on 12/08/2023 with findings of an enlarging complex multiseptate cystic lesion in the head of the pancreas.  It measured 5.2 x 4.5 cm and previously it was 4.7 x 4.3 cm.  Because of the enlargement she was referred by Dr. Dasie for work up.  An EUS was performed at [facility] on 04/15/2016 and it was a nondiagnostic specimen.  Her Ca 19-9 on 02/16/2024 was at 11.  With the EUS 15 mL of blood tinged fluid was obtained.  An MRCP at that time showed pancreas divisum, but no PD dilation.  In 2017 the cyst measured 3.2 x 2.8 cm.  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 b.)07/18/2006 -endovas oblit complex ACOM aneur. c.) Endovas Tx of 6.8x45mm RMCA aneur. d.) 3.5x28mm remnant RMCA aneur 2/2 coil compaction. e.) Interval 3.2x3.99mm saccular outpouching in ACOM c/w mild recannulization in neck. 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 g.) 3.7x17mm remnant of prev Tx'd ACOM aneur -failed embol 02/24/2012.   CKD (chronic kidney disease), stage IV (HCC)    a.) solitary functioning kidney on the RIGHT   Complication of anesthesia    a.) delayed emergence   Coronary artery disease 02/23/2007   a.) LHC 02/23/2007 --> EF 50%; 30% pLAD, 70% mRCA --> planned for staged PCI. b.) PCI 02/27/2007: EF 60%; 3.5 x 15 mm Vision BMS to 80% mRCA. b.) LHC 07/24/2007: EF 60%; minor irregs; no occlusive CAD; no intervention. c.) LHC 10/28/2010: EF 60%; 30% mLAD, LCx with minor luminal irregs, 20% ISR mRCA; no intervention. d.) LHC 05/31/2013: EF 60%; 40% mLAD, 30% ISR m-dRCA; no interventions.   DDD (degenerative disc disease), cervical    a.) s/p ACDF C5-C7; hardware in neck; patient appreciates stiffness and issues with mobility   Diastolic dysfunction     a.)  TTE 07/07/2021: EF 87.8%; normal PASP; trace TR/MR; G1DD.   Elevated LEFT hemidiaphragm    Gout    Headache(784.0)    Heart murmur    HLD (hyperlipidemia)    HOH (hard of hearing)    Has hearing aids, doesn't wear   Hyperkalemia    Hyperparathyroidism due to renal insufficiency    Hypertension    Incomplete right bundle branch block (RBBB)    Insomnia    a.) takes zolpidem    Long term (current) use of immunomodulator    a.) on DMARD therapy (hydroxychloroquine ) for RA/SLE Dx.   Lumbar adjacent segment disease with spondylolisthesis    Multiple acquired cysts of kidney    On chronic clopidogrel  therapy    OSA (obstructive sleep apnea)    a.) does NOT use nocturnal pap therapy   Osteoarthritis    Pancreatic cyst    Peripheral vascular disease    Pneumonia    Post-COVID chronic cough    Pulmonary nodule    RBBB (right bundle branch block)    Rectal bleeding    Rheumatoid arthritis (HCC)    a.) on DMARD; hydroxychloriquine   Shortness of breath 03/01/2023   Systemic lupus erythematosus (HCC)    T2DM (type 2 diabetes mellitus) (HCC)    Tendinitis of left wrist    Thickened endometrium    Thickened endometrium    Wears dentures  partial lower    Past Surgical History:  Procedure Laterality Date   ANEURYSM COILING  08/2013   ANTERIOR LATERAL LUMBAR FUSION WITH PERCUTANEOUS SCREW 1 LEVEL N/A 03/09/2023   Procedure: L3-4 LATERAL LUMBAR INTERBODY FUSION;  Surgeon: Clois Fret, MD;  Location: [facility];  Service: Neurosurgery;  Laterality: N/A;   APPLICATION OF INTRAOPERATIVE CT SCAN N/A 03/09/2023   Procedure: APPLICATION OF INTRAOPERATIVE CT SCAN;  Surgeon: Clois Fret, MD;  Location: [facility];  Service: Neurosurgery;  Laterality: N/A;   Attempted embolization of previously treated ACOM aneurysm; unsuccessful N/A 01/28/2012   Location: [facility]   BACK SURGERY     BARTHOLIN CYST MARSUPIALIZATION     BICEPT TENODESIS Right 03/02/2022    Procedure: BICEPS TENODESIS;  Surgeon: Edie Norleen PARAS, MD;  Location: [facility];  Service: Orthopedics;  Laterality: Right;   BREAST CYST ASPIRATION Right 01/25/2012   FNA neg.   BREAST EXCISIONAL BIOPSY Left 06/26/2007   neg   BUNIONECTOMY Bilateral 2022   CARDIAC CATHETERIZATION Left 02/23/2007   Procedure: CARDIAC CATHETERIZATION; Location: [facility]; Surgeon: Denyse Bathe, MD   CARDIAC CATHETERIZATION Left 07/31/2007   Procedure: CARDIAC CATHETERIZATION; Location: [facility]; Surgeon: Denyse Bathe, MD   CARDIAC CATHETERIZATION Left 10/28/2010   Procedure: CARDIAC CATHETERIZATION; Location: [facility]; Surgeon: Denyse Bathe, MD   CARDIAC CATHETERIZATION Left 05/31/2013   Procedure: CARDIAC CATHETERIZATION; Location: [facility]; Surgeon: Denyse Bathe, MD   Carotid arteriogram; 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 N/A 07/18/2006   Location: [facility]   CATARACT EXTRACTION     CERVICAL FUSION N/A    Procedure: ACDF C5-C7   COLONOSCOPY WITH PROPOFOL  N/A 10/04/2019   Procedure: COLONOSCOPY WITH BIOPSY;  Surgeon: Unk Corinn Skiff, MD;  Location: [facility];  Service: Endoscopy;  Laterality: N/A;  Diabetic (borderline) - oral meds priority 3   CORONARY STENT INTERVENTION Left 02/27/2007   Procedure: CORONARY STENT INTERVENTION (3.5 x 15 mm Vision BMS to mRCA); Location: [facility]; Surgeon: Deatrice Cage, MD   Endovascular near complete obliteration of enlarging neck remnant of previously treated RIGHT MCA aneurysm using stent assisted coiling N/A 09/03/2010   Location: [facility]   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 N/A 10/03/2006   Location: [facility]   HYSTEROSCOPY WITH D & C N/A 08/04/2021   Procedure: DILATATION AND CURETTAGE /HYSTEROSCOPY;  Surgeon: Victor Claudell SAUNDERS, MD;  Location: [facility];  Service: Gynecology;  Laterality: N/A;   POLYPECTOMY N/A 10/04/2019   Procedure: POLYPECTOMY;  Surgeon:  Unk Corinn Skiff, MD;  Location: [facility];  Service: Endoscopy;  Laterality: N/A;   REVERSE SHOULDER ARTHROPLASTY Right 03/02/2022   Procedure: REVERSE SHOULDER ARTHROPLASTY;  Surgeon: Edie Norleen PARAS, MD;  Location: [facility];  Service: Orthopedics;  Laterality: Right;   UPPER ESOPHAGEAL ENDOSCOPIC ULTRASOUND (EUS) N/A 04/15/2016   Procedure: UPPER ESOPHAGEAL ENDOSCOPIC ULTRASOUND (EUS);  Surgeon: Asberry DELENA Coffee, MD;  Location: [facility];  Service: Gastroenterology;  Laterality: N/A;    Family History  Problem Relation Age of Onset   Breast cancer Paternal Aunt 53   Breast cancer Paternal Aunt    Heart disease Mother    Heart disease Father    Stroke Father    Diabetes Sister    Diabetes Brother     Social History:  reports that she has never smoked. She has never been exposed to tobacco smoke. She has never used smokeless tobacco. She reports current alcohol use. She reports that she does not use drugs.  Allergies:  Allergies  Allergen Reactions   Isosorbide Dinitrate Other (See Comments)    Collapse   Amlodipine Swelling   Sulfa Antibiotics Itching   Tizanidine Hcl    Oxycodone  Itching    Medications: Scheduled: Continuous:  No results found for this or any previous visit (from the past 24 hours).   No results found.  ROS:  As stated above in the HPI otherwise negative.  There were no vitals taken for this visit.    PE: Gen: NAD, Alert and Oriented HEENT:  [place]/AT, EOMI Neck: Supple, no LAD Lungs: CTA Bilaterally CV: RRR without M/G/R ABD: Soft, NTND, +BS Ext: No C/C/E  Assessment/Plan: 1) Enlarging pancreatic cyst - EUS with FNA.  Jatara Huettner D 04/06/2024, 9:01 AM

## 2024-04-06 NOTE — Op Note (Signed)
 [facility] Patient Name: [name] Procedure Date: 04/06/2024 MRN: [id number] Attending MD: Belvie Just , MD, [id number] Date of Birth: [birth year] CSN: [id number] Age: 78 Admit Type: Ambulatory Procedure:                Upper EUS Indications:              Pancreatic cyst on CT scan Providers:                Belvie Just, MD, Willy Hummer, RN, Fairy Marina, Technician Referring MD:              Medicines:                Propofol  per Anesthesia Complications:            No immediate complications. Estimated Blood Loss:     Estimated blood loss was minimal. Procedure:                Pre-Anesthesia Assessment:                           - Prior to the procedure, a History and Physical                            was performed, and patient medications and                            allergies were reviewed. The patient's tolerance of                            previous anesthesia was also reviewed. The risks                            and benefits of the procedure and the sedation                            options and risks were discussed with the patient.                            All questions were answered, and informed consent                            was obtained. Prior Anticoagulants: The patient has                            taken no anticoagulant or antiplatelet agents. ASA                            Grade Assessment: III - A patient with severe                            systemic disease. After reviewing the risks and  benefits, the patient was deemed in satisfactory                            condition to undergo the procedure.                           - Sedation was administered by an anesthesia                            professional. Deep sedation was attained.                           After obtaining informed consent, the endoscope was                            passed under direct  vision. Throughout the                            procedure, the patient's blood pressure, pulse, and                            oxygen saturations were monitored continuously. The                            [id number] ([id number] Olympus endosonoscope was                            introduced through the mouth, and advanced to the                            second part of duodenum. The upper EUS was                            accomplished without difficulty. The patient                            tolerated the procedure well. Scope In: Scope Out: Findings:      ENDOSONOGRAPHIC FINDING: :      An anechoic, multicystic and septated lesion suggestive of a cyst was       identified in the pancreatic head. It is not in obvious communication       with the pancreatic duct. The lesion measured 41 mm by 43 mm in maximal       cross-sectional diameter. There were many compartments thinly septated.       The outer wall of the lesion was thin. There was no associated mass.       There was no internal debris within the fluid-filled cavity. Fine needle       aspiration for cytology was performed. Color Doppler imaging was       utilized prior to needle puncture to confirm a lack of significant       vascular structures within the needle path. One pass was made with the       22 gauge needle using a transduodenal approach. No stylet was used. A       preliminary cytologic examination was not performed. Final  cytology       results are pending.      Two stones were visualized endosonographically in the gallbladder body.       The stones were oval. They were hyperechoic and characterized by       shadowing.      In the head of the pancreas a large 41 x 43 mm multiloculated cyst was       noted. Doppler revealed a complex network of vessels coursing through       the septations. It was not possible to determine if the cyst had       communication with the main PD or a side branch. The PD in the body of        the pancreas was normal. There was no upstream dilation. The CBD was       normal in caliber at 4 mm and there was evidence of some stones in the       gallbladder. No other abnormalities were identified. One pass with the       22 gauge FNA needle was performed and blood tinged thin fluid was       obtained. Impression:               - A cystic lesion was seen in the pancreatic head.                            Fine needle aspiration performed.                           - Two stones were visualized endosonographically in                            the gallbladder body. Moderate Sedation:      Not Applicable - Patient had care per Anesthesia. Recommendation:           - Patient has a contact number available for                            emergencies. The signs and symptoms of potential                            delayed complications were discussed with the                            patient. Return to normal activities tomorrow.                            Written discharge instructions were provided to the                            patient.                           - Resume regular diet.                           - Continue present medications.                           -  CEA, amylase, and glucose. Procedure Code(s):        --- Professional ---                           [phone], Esophagogastroduodenoscopy, flexible,                            transoral; with transendoscopic ultrasound-guided                            intramural or transmural fine needle                            aspiration/biopsy(s), (includes endoscopic                            ultrasound examination limited to the esophagus,                            stomach or duodenum, and adjacent structures) Diagnosis Code(s):        --- Professional ---                           X13.7, Cyst of pancreas                           K80.20, Calculus of gallbladder without                            cholecystitis without  obstruction CPT copyright 2022 [employer]. All rights reserved. The codes documented in this report are preliminary and upon coder review may  be revised to meet current compliance requirements. Belvie Just, MD Belvie Just, MD 04/06/2024 10:33:55 AM This report has been signed electronically. Number of Addenda: 0

## 2024-04-06 NOTE — Anesthesia Preprocedure Evaluation (Addendum)
 Anesthesia Evaluation  Patient identified by MRN, date of birth, ID band Patient awake    Reviewed: Allergy & Precautions, H&P , NPO status , Patient's Chart, lab work & pertinent test results  History of Anesthesia Complications (+) PROLONGED EMERGENCE and history of anesthetic complications  Airway Mallampati: III  TM Distance: <3 FB Neck ROM: limited    Dental  (+) Poor Dentition, Caps, Dental Advidsory Given, Implants   Pulmonary shortness of breath and with exertion, sleep apnea  Elevated LEFT hemidiaphragm   Pulmonary exam normal breath sounds clear to auscultation       Cardiovascular Exercise Tolerance: Good hypertension, (-) angina + CAD, + Cardiac Stents and + Peripheral Vascular Disease  (-) Past MI Normal cardiovascular exam+ dysrhythmias (RBBB) + Valvular Problems/Murmurs  Rhythm:Regular Rate:Normal - Systolic murmurs TTE performed on 07/07/2021 revealed normal left ventricular systolic function with an EF of 87.8%.  Diastolic Doppler parameters consistent with impaired relaxation (G1DD).  PASP normal.  There was trace tricuspid and mitral valve regurgitation.  There was no evidence of a significant transvalvular gradient to suggest stenosis.  Aorta normal in size with no evidence of aneurysmal dilatation.   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  ACOM aneurysms measured 6.2 x 4.5 mm and 4.9 x 4.2 mm.  RIGHT MCA aneurysm measured 7.0 x 4.5 mm.  Patient underwent endovascular obliteration of her complex ACOM aneurysms on 07/18/2006.  Patient underwent endovascular treatment of her RIGHT MCA aneurysm on 10/03/2006.  Patient noted to have 3.5 x 3.0 remnant of the RIGHT MCA trifurcation region aneurysm secondary to coil compaction on 09/14/2007.  Patient found to have 3.2 x 3.1 mm  saccular outpouching in the previously treated ACOM aneurysm consistent with mild recannulization in the neck.  Patient underwent endovascular near complete obliteration of and enlarging neck aneurysm on 09/03/2010.  This was her previously treated RIGHT MCA aneurysm.  Procedure completed using stent assisted coiling.  Patient found to have a 3.7 x 2 mm neck remnant of a previously treated ACOM aneurysm on 01/28/2012.  She underwent attempted embolization procedure on 02/24/2012, however procedure was unsuccessful.   Neuromuscular disease (Lumbar adjacent segment disease with spondylolisthesis)  negative psych ROS   GI/Hepatic negative GI ROS, Neg liver ROS,neg GERD  ,,  Endo/Other  diabetes, Type 2    Renal/GU CRFRenal disease (Stage IV)Potassium WNL today  negative genitourinary   Musculoskeletal  (+) Arthritis , Rheumatoid disorders,    Abdominal  (+) + obese  Peds  Hematology  (+) Blood dyscrasia, anemia   Anesthesia Other Findings Patient has cardiac clearance for this procedure.   Past Medical History: No date: Anemia of chronic renal disease No date: Arthritis 06/07/2006: Cerebral aneurysm     Comment:  a.) 6.2x4.60mm and 4.9x4.4mm ACOM & 7x4.72mm RMCA aneur.               b.)07/18/2006 -endovas oblit complex ACOM aneur. c.)               Endovas Tx of 6.8x77mm RMCA aneur. d.) 3.5x37mm remnant               RMCA aneur 2/2 coil compaction. e.) Interval 3.2x3.11mm               saccular outpouching in ACOM c/w mild recannulization in               neck.  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               g.) 3.7x69mm remnant of prev Tx'd ACOM aneur -failed embol              02/24/2012. No date: CKD (chronic kidney disease), stage IV (HCC)     Comment:  a.) solitary functioning kidney on the RIGHT No date: Complication of anesthesia     Comment:  a.) delayed emergence 02/23/2007: Coronary artery disease     Comment:  a.) LHC 02/23/2007 --> EF 50%; 30% pLAD, 70%  mRCA -->               planned for staged PCI. b.) PCI 02/27/2007: EF 60%; 3.5 x              15 mm Vision BMS to 80% mRCA. b.) LHC 07/24/2007: EF 60%;              minor irregs; no occlusive CAD; no intervention. c.) LHC               10/28/2010: EF 60%; 30% mLAD, LCx with minor luminal               irregs, 20% ISR mRCA; no intervention. d.) LHC               05/31/2013: EF 60%; 40% mLAD, 30% ISR m-dRCA; no               interventions. No date: Diastolic dysfunction     Comment:  a.)  TTE 07/07/2021: EF 87.8%; normal PASP; trace TR/MR;              G1DD. No date: Elevated hemidiaphragm     Comment:  a.) LEFT No date: Headache(784.0) No date: Heart murmur No date: HLD (hyperlipidemia) No date: HOH (hard of hearing)     Comment:  Has hearing aids, doesn't wear No date: Hyperparathyroidism due to renal insufficiency (HCC) No date: Hypertension No date: Incomplete right bundle branch block (RBBB) No date: Insomnia     Comment:  a.) takes zolpidem  No date: Long term (current) use of immunomodulator     Comment:  a.) on DMARD therapy (hydroxychloriquine) for RA/SLE Dx. No date: Long term current use of antithrombotics/antiplatelets     Comment:  a.) clopidogrel  No date: Multiple acquired cysts of kidney No date: Neck stiffness     Comment:  screws in neck.  Some limitations of movement No date: OSA (obstructive sleep apnea)     Comment:  a.) does NOT use nocturnal pap therapy No date: Pancreatic cyst No date: RBBB (right bundle branch block) No date: Rheumatoid arthritis (HCC)     Comment:  a.) on DMARD; hydroxychloriquine No date: Systemic lupus erythematosus (HCC) No date: T2DM (type 2 diabetes mellitus) (HCC) No date: Wears dentures     Comment:  partial lower   Reproductive/Obstetrics negative OB ROS                              Anesthesia Physical Anesthesia Plan  ASA: 3  Anesthesia Plan: MAC   Post-op Pain Management:    Induction:  Intravenous  PONV Risk Score and Plan: 2 and Treatment may vary due to age or medical condition and Propofol  infusion  Airway Management Planned: Nasal Cannula  Additional Equipment: None  Intra-op Plan:   Post-operative Plan:   Informed Consent: I have reviewed the  patients History and Physical, chart, labs and discussed the procedure including the risks, benefits and alternatives for the proposed anesthesia with the patient or authorized representative who has indicated his/her understanding and acceptance.     Dental Advisory Given  Plan Discussed with: Anesthesiologist, CRNA and Surgeon  Anesthesia Plan Comments: (Patient consented for risks of anesthesia including but not limited to:  - adverse reactions to medications - damage to eyes, teeth, lips or other oral mucosa - nerve damage due to positioning  - sore throat or hoarseness - Damage to heart, brain, nerves, lungs, other parts of body or loss of life  Patient voiced understanding.  )         Anesthesia Quick Evaluation

## 2024-04-06 NOTE — Anesthesia Postprocedure Evaluation (Signed)
 Anesthesia Post Note  Patient: [name]  Procedure(s) Performed: ULTRASOUND, UPPER GI TRACT, ENDOSCOPIC     Patient location during evaluation: PACU Anesthesia Type: MAC Level of consciousness: awake and alert Pain management: pain level controlled Vital Signs Assessment: post-procedure vital signs reviewed and stable Respiratory status: spontaneous breathing, nonlabored ventilation and respiratory function stable Cardiovascular status: blood pressure returned to baseline and stable Postop Assessment: no apparent nausea or vomiting Anesthetic complications: no   No notable events documented.  Last Vitals:  Vitals:   04/06/24 1040 04/06/24 1050  BP: (!) 116/49 (!) 138/59  Pulse: 65 62  Resp: 14 13  Temp:    SpO2: 97% 96%    Last Pain:  Vitals:   04/06/24 1050  TempSrc:   PainSc: 0-No pain                 Butler Levander Pinal

## 2024-04-06 NOTE — Discharge Instructions (Signed)

## 2024-04-06 NOTE — Anesthesia Procedure Notes (Signed)
 Procedure Name: MAC Date/Time: 04/06/2024 9:54 AM  Performed by: Franchot Delon RAMAN, CRNAPre-anesthesia Checklist: Patient identified, Emergency Drugs available, Suction available and Patient being monitored Oxygen Delivery Method: Simple face mask Airway Equipment and Method: Bite block Placement Confirmation: positive ETCO2 Dental Injury: Teeth and Oropharynx as per pre-operative assessment

## 2024-04-09 ENCOUNTER — Other Ambulatory Visit: Payer: Self-pay

## 2024-04-09 ENCOUNTER — Other Ambulatory Visit

## 2024-04-09 ENCOUNTER — Encounter (HOSPITAL_COMMUNITY): Payer: Self-pay | Admitting: Gastroenterology

## 2024-04-09 NOTE — Patient Instructions (Signed)
 Visit Information  Ms. Mapp was given information about Medicaid Managed Care team care coordination services as a part of their [facility] [employer] Medicaid benefit.   If you would like to schedule transportation through your [facility], please call the following number at least 2 days in advance of your appointment: [phone]   Rides for urgent appointments can also be made after hours by calling Member Services.  Call the Behavioral Health Crisis Line at [phone], at any time, 24 hours a day, 7 days a week. If you are in danger or need immediate medical attention call 911.  Please see education materials related to Diabetes  provided by MyChart link.  Patient verbalizes understanding of instructions and care plan provided today and agrees to view in MyChart. Active MyChart status and patient understanding of how to access instructions and care plan via MyChart confirmed with patient.     Telephone follow up appointment with Managed Medicaid care management team member scheduled for: 05-07-2024 at 11:00 AM   Hendricks Her RN, BSN  [place] [facility] RN Case Manager   Direct [phone]   Following is a copy of your plan of care:  There are no care plans that you recently modified to display for this patient.

## 2024-04-10 DIAGNOSIS — S92354D Nondisplaced fracture of fifth metatarsal bone, right foot, subsequent encounter for fracture with routine healing: Secondary | ICD-10-CM | POA: Diagnosis not present

## 2024-04-23 ENCOUNTER — Ambulatory Visit (HOSPITAL_BASED_OUTPATIENT_CLINIC_OR_DEPARTMENT_OTHER): Admitting: Student in an Organized Health Care Education/Training Program

## 2024-04-23 ENCOUNTER — Encounter: Payer: Self-pay | Admitting: Student in an Organized Health Care Education/Training Program

## 2024-04-23 ENCOUNTER — Ambulatory Visit
Admission: RE | Admit: 2024-04-23 | Discharge: 2024-04-23 | Disposition: A | Discharge status: Home / Self Care | Source: Ambulatory Visit | Attending: Student in an Organized Health Care Education/Training Program | Admitting: Student in an Organized Health Care Education/Training Program

## 2024-04-23 VITALS — BP 151/69 | HR 69 | Temp 98.0°F | Resp 20 | Ht 63.0 in | Wt 180.0 lb

## 2024-04-23 DIAGNOSIS — M961 Postlaminectomy syndrome, not elsewhere classified: Secondary | ICD-10-CM

## 2024-04-23 DIAGNOSIS — M47816 Spondylosis without myelopathy or radiculopathy, lumbar region: Secondary | ICD-10-CM

## 2024-04-23 DIAGNOSIS — G894 Chronic pain syndrome: Secondary | ICD-10-CM

## 2024-04-23 MED ORDER — ROPIVACAINE HCL 2 MG/ML IJ SOLN
18.0000 mL | Freq: Once | INTRAMUSCULAR | Status: AC
  Administered 2024-04-23: 18 mL via PERINEURAL

## 2024-04-23 MED ORDER — ROPIVACAINE HCL 2 MG/ML IJ SOLN
INTRAMUSCULAR | Status: AC
  Filled 2024-04-23: qty 20

## 2024-04-23 MED ORDER — LIDOCAINE HCL 2 % IJ SOLN
20.0000 mL | Freq: Once | INTRAMUSCULAR | Status: AC
  Administered 2024-04-23: 200 mg

## 2024-04-23 MED ORDER — DEXAMETHASONE SOD PHOSPHATE PF 10 MG/ML IJ SOLN
20.0000 mg | Freq: Once | INTRAMUSCULAR | Status: AC
  Administered 2024-04-23: 20 mg

## 2024-04-23 MED ORDER — LIDOCAINE HCL (PF) 2 % IJ SOLN
INTRAMUSCULAR | Status: AC
  Filled 2024-04-23: qty 10

## 2024-04-23 NOTE — Progress Notes (Signed)
 PROVIDER NOTE: Interpretation of information contained herein should be left to medically-trained personnel. Specific patient instructions are provided elsewhere under Patient Instructions section of medical record. This document was created in part using STT-dictation technology, any transcriptional errors that may result from this process are unintentional.  Patient: [name] Type: Established DOB: 21-Jan-1946 MRN: [id number] PCP: Fernand Fredy RAMAN, MD  Service: Procedure DOS: 04/23/2024 Setting: Ambulatory Location: Ambulatory outpatient facility Delivery: Face-to-face Provider: Wallie Sherry, MD Specialty: Interventional Pain Management Specialty designation: 09 Location: Outpatient facility Ref. Prov.: Fernand Fredy RAMAN, MD       Interventional Therapy   Type: Lumbar Facet, Medial Branch Block(s) (w/ fluoroscopic mapping) #2  Laterality: Bilateral  Level: L4, L5, and S1 Medial Branch/Dorsal Rami Level(s). Injecting these levels blocks the L4-5 and L5-S1 lumbar facet joints. Imaging: Fluoroscopic guidance Spinal ([id number]) Anesthesia: Local anesthesia (1-2% Lidocaine ) DOS: 04/23/2024 Performed by: Wallie Sherry, MD  Primary Purpose: Diagnostic/Therapeutic Indications: Low back pain severe enough to impact quality of life or function. 1. Lumbar facet arthropathy   2. Failed back surgical syndrome   3. Chronic pain syndrome     NAS-11 Pain score:   Pre-procedure: 7 /10   Post-procedure: 1 /10     Position / Prep / Materials:  Position: Prone  Prep solution: ChloraPrep (2% chlorhexidine  gluconate and 70% isopropyl alcohol) Area Prepped: Posterolateral Lumbosacral Spine (Wide prep: From the lower border of the scapula down to the end of the tailbone and from flank to flank.)  Materials:  Tray: Block Needle(s):  Type: Spinal  Gauge (G): 22  Length: 3.5-in Qty: 2     H&P (Pre-op Assessment):  [name] is a 78 y.o. (year old), female patient, seen today for  interventional treatment. She  has a past surgical history that includes Cataract extraction; Cervical fusion (N/A); Cardiac catheterization (Left, 02/23/2007); Aneurysm coiling (08/2013); Back surgery; Bartholin cyst marsupialization; Upper esophageal endoscopic ultrasound (eus) (N/A, 04/15/2016); Breast cyst aspiration (Right, 01/25/2012); Colonoscopy with propofol  (N/A, 10/04/2019); polypectomy (N/A, 10/04/2019); Breast excisional biopsy (Left, 06/26/2007); Bunionectomy (Bilateral, 2022); CORONARY STENT INTERVENTION (Left, 02/27/2007); Cardiac catheterization (Left, 07/31/2007); Cardiac catheterization (Left, 10/28/2010); Cardiac catheterization (Left, 05/31/2013); Carotid arteriogram; 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 (N/A, 07/18/2006); 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 (N/A, 10/03/2006); Endovascular near complete 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 [name] has a current medication list which includes the following prescription(s): albuterol , allopurinol , aspirin  ec, calcitriol, ciprofloxacin , citalopram , clotrimazole -betamethasone, cyanocobalamin, ezetimibe , fluticasone , furosemide , gabapentin , glimepiride , hydralazine , hydroxychloroquine , labetalol , levocetirizine, losartan , methocarbamol , nitroglycerin , ondansetron , pantoprazole, pantoprazole, rosuvastatin , senna, symbicort,  tramadol , tretinoin, and zolpidem . Her primarily concern today is the Back Pain (Lumbar bilaterally )  Initial Vital Signs:  Pulse/HCG Rate: 69ECG Heart Rate: 73 Temp: 98 F (36.7 C) Resp: 16 BP: (!) 163/77 SpO2: 96 %  BMI: Estimated body mass index is 31.89 kg/m as calculated from the following:   Height as of this encounter: 5' 3 (1.6 m).   Weight as of this encounter: 180 lb (81.6 kg).  Risk Assessment: Allergies: Reviewed. She is allergic to isosorbide dinitrate, amlodipine, sulfa antibiotics, tizanidine hcl, and oxycodone .  Allergy Precautions: None required Coagulopathies: Reviewed. None identified.  Blood-thinner therapy: None at this time Active Infection(s): Reviewed. None identified. Ms. Dressel is afebrile  Site Confirmation: [name] was asked to confirm the procedure and laterality before marking the site Procedure checklist: Completed Consent: Before the procedure and under the influence of no sedative(s), amnesic(s), or anxiolytics, the patient was informed of the treatment options, risks and possible complications. To fulfill our ethical and legal obligations, as recommended by the American Medical Association's Code of Ethics, I have informed the patient of my clinical impression; the nature and purpose of the treatment or procedure; the risks, benefits, and possible complications of the intervention; the alternatives, including doing nothing; the risk(s) and benefit(s) of the alternative treatment(s) or procedure(s); and the risk(s) and benefit(s) of doing nothing. The patient was provided information about the general risks and possible complications associated with the procedure. These may include, but are not limited to: failure to achieve desired goals, infection, bleeding, organ or nerve damage, allergic reactions, paralysis, and death. In addition, the patient was informed of those risks and complications associated to Spine-related procedures, such as  failure to decrease pain; infection (i.e.: Meningitis, epidural or intraspinal abscess); bleeding (i.e.: epidural hematoma, subarachnoid hemorrhage, or any other type of intraspinal or peri-dural bleeding); organ or nerve damage (i.e.: Any type of peripheral nerve, nerve root, or spinal cord injury) with subsequent damage to sensory, motor, and/or autonomic systems, resulting in permanent pain, numbness, and/or weakness of one or several areas of the body; allergic reactions; (i.e.: anaphylactic reaction); and/or death. Furthermore, the patient was informed of those risks and complications associated with the medications. These include, but are not limited to: allergic reactions (i.e.: anaphylactic or anaphylactoid reaction(s)); adrenal axis suppression; blood sugar elevation that in diabetics may result in ketoacidosis or comma; water  retention that in patients with history of congestive heart failure may result in shortness of breath, pulmonary edema, and decompensation with resultant heart failure; weight gain; swelling or edema; medication-induced neural toxicity; particulate matter embolism and blood vessel occlusion with resultant organ, and/or nervous system infarction; and/or aseptic necrosis of one or more joints. Finally, the patient was informed that Medicine is not an exact science; therefore, there is also the possibility of unforeseen or unpredictable risks and/or possible complications that may result in a catastrophic outcome. The patient indicated having understood very clearly. We have given the patient no guarantees and we have made no promises. Enough time was given to the patient to ask questions, all of which were answered to the patient's satisfaction. Ms. Morrisette has indicated that she wanted to continue with the procedure. Attestation: I, the ordering provider, attest that I have discussed with the patient the benefits, risks, side-effects, alternatives, likelihood of achieving goals,  and potential problems during recovery for the procedure that I have provided informed consent. Date  Time: 04/23/2024  1:07 PM  Pre-Procedure Preparation:  Monitoring: As per clinic protocol. Respiration, ETCO2, SpO2, BP, heart rate and rhythm monitor placed and checked for adequate function Safety Precautions: Patient was assessed for positional comfort and pressure points before starting the procedure. Time-out: I initiated and conducted the Time-out before starting the procedure, as per protocol. The patient was asked to participate by confirming the accuracy of the Time Out information. Verification of the correct person, site, and procedure were performed and confirmed by me, the nursing staff, and the patient. Time-out conducted as per Joint Commission's Universal Protocol (UP.01.01.01). Time: 1331 Start Time: 1331 hrs.  Description of  Procedure:          Laterality: (see above) Targeted Levels: (see above)  Safety Precautions: Aspiration looking for blood return was conducted prior to all injections. At no point did we inject any substances, as a needle was being advanced. Before injecting, the patient was told to immediately notify me if she was experiencing any new onset of ringing in the ears, or metallic taste in the mouth. No attempts were made at seeking any paresthesias. Safe injection practices and needle disposal techniques used. Medications properly checked for expiration dates. SDV (single dose vial) medications used. After the completion of the procedure, all disposable equipment used was discarded in the proper designated medical waste containers. Local Anesthesia: Protocol guidelines were followed. The patient was positioned over the fluoroscopy table. The area was prepped in the usual manner. The time-out was completed. The target area was identified using fluoroscopy. A 12-in long, straight, sterile hemostat was used with fluoroscopic guidance to locate the targets for  each level blocked. Once located, the skin was marked with an approved surgical skin marker. Once all sites were marked, the skin (epidermis, dermis, and hypodermis), as well as deeper tissues (fat, connective tissue and muscle) were infiltrated with a small amount of a short-acting local anesthetic, loaded on a 10cc syringe with a 25G, 1.5-in  Needle. An appropriate amount of time was allowed for local anesthetics to take effect before proceeding to the next step. Local Anesthetic: Lidocaine  2.0% The unused portion of the local anesthetic was discarded in the proper designated containers. Technical description of process:  Medial Branch  Dorsal Rami Nerve Block (MBB):  Neuroanatomy note: Each lumbar facet joint receives dual innervation from medial branches arising from the posterior primary rami at the same level and one level above. The target for each lumbar medial branch is the junction of the ipsilateral superior articular and transverse process of the lower vertebral body. (i.e.: The L4-L5 facet joint is innervated by the L4 medial branch [located at L5] and the L3 medial branch [located at L4]. Blocking the L4 Medial Branch is therefore achieved by injecting at the junction of the ipsilateral superior articular and transverse process of the lower vertebral body [L5].).  Exception: The exception to the above rule is the L5-S1 facet joint which has triple innervation requiring the L4 medial branch, as well as the L5 and the S1 Dorsal Rami(s) to be blocked to fully denervate the joint.  Under fluoroscopic guidance, a needle was inserted until contact was made with os over the target area. After negative aspiration, 2mL of the nerve block solution was injected without difficulty or complication. Paresthesia were avoided during injection. The needle(s) were removed intact and without complication.  Once the entire procedure was completed, the treated area was cleaned, making sure to leave some of the  prepping solution back to take advantage of its long term bactericidal properties.         Illustration of the posterior view of the lumbar spine and the posterior neural structures. Laminae of L2 through S1 are labeled. DPRL5, dorsal primary ramus of L5; DPRS1, dorsal primary ramus of S1; DPR3, dorsal primary ramus of L3; FJ, facet (zygapophyseal) joint L3-L4; I, inferior articular process of L4; LB1, lateral branch of dorsal primary ramus of L1; IAB, inferior articular branches from L3 medial branch (supplies L4-L5 facet joint); IBP, intermediate branch plexus; MB3, medial branch of dorsal primary ramus of L3; NR3, third lumbar nerve root; S, superior articular process of L5; SAB,  superior articular branches from L4 (supplies L4-5 facet joint also); TP3, transverse process of L3.   Facet Joint Innervation (* possible contribution)  L1-2 T12, L1 (L2*)  Medial Branch  L2-3 L1, L2 (L3*)                     L3-4 L2, L3 (L4*)                     L4-5 L3, L4 (L5*)                     L5-S1 L4, L5, S1                        Vitals:   04/23/24 1330 04/23/24 1334 04/23/24 1337 04/23/24 1339  BP: (!) 179/89 (!) 168/85 (!) 173/79 (!) 151/69  Pulse:      Resp: 14 18 19 20   Temp:      TempSrc:      SpO2: 97% 97% 97% 96%  Weight:      Height:         End Time: 1338 hrs.  Imaging Guidance (Spinal):         Type of Imaging Technique: Fluoroscopy Guidance (Spinal) Indication(s): Fluoroscopy guidance for needle placement to enhance accuracy in procedures requiring precise needle localization for targeted delivery of medication in or near specific anatomical locations not easily accessible without such real-time imaging assistance. Exposure Time: Please see nurses notes. Contrast: None used. Fluoroscopic Guidance: I was personally present during the use of fluoroscopy. Tunnel Vision Technique used to obtain the best possible view of the target area. Parallax error corrected before  commencing the procedure. Direction-depth-direction technique used to introduce the needle under continuous pulsed fluoroscopy. Once target was reached, antero-posterior, oblique, and lateral fluoroscopic projection used confirm needle placement in all planes. Images permanently stored in EMR. Interpretation: No contrast injected. I personally interpreted the imaging intraoperatively. Adequate needle placement confirmed in multiple planes. Permanent images saved into the patient's record.  Post-operative Assessment:  Post-procedure Vital Signs:  Pulse/HCG Rate: 6967 Temp: 98 F (36.7 C) Resp: 20 BP: (!) 151/69 SpO2: 96 %  EBL: None  Complications: No immediate post-treatment complications observed by team, or reported by patient.  Note: The patient tolerated the entire procedure well. A repeat set of vitals were taken after the procedure and the patient was kept under observation following institutional policy, for this type of procedure. Post-procedural neurological assessment was performed, showing return to baseline, prior to discharge. The patient was provided with post-procedure discharge instructions, including a section on how to identify potential problems. Should any problems arise concerning this procedure, the patient was given instructions to immediately contact us , at any time, without hesitation. In any case, we plan to contact the patient by telephone for a follow-up status report regarding this interventional procedure.  Comments:  No additional relevant information.  Plan of Care (POC)  Orders:  Orders Placed This Encounter  Procedures   DG PAIN [facility] C-ARM 1-60 MIN NO REPORT    Intraoperative interpretation by procedural physician at [facility].    Standing Status:   Standing    Number of Occurrences:   1    Reason for exam::   Assistance in needle guidance and placement for procedures requiring needle placement in or near specific anatomical locations not  easily accessible without such assistance.   Medications ordered for procedure: Meds ordered this encounter  Medications   lidocaine  (XYLOCAINE ) 2 % (with pres) injection 400 mg   ropivacaine  (PF) 2 mg/mL (0.2%) (NAROPIN ) injection 18 mL   dexamethasone  (DECADRON ) injection 20 mg   Medications administered: We administered lidocaine , ropivacaine  (PF) 2 mg/mL (0.2%), and dexamethasone .  See the medical record for exact dosing, route, and time of administration.    B/L Cluneal NB 08/24/23, B/L L4,5, S1 MBNB 03/07/24, 04/23/24    Follow-up plan:   Return in about 8 weeks (around 06/19/2024) for PPE, F2F.     Recent Visits Date Type Provider Dept  03/28/24 Office Visit Marcelino Nurse, MD [facility]  03/07/24 Procedure visit Marcelino Nurse, MD [facility]  02/14/24 Office Visit Marcelino Nurse, MD [facility]  Showing recent visits within past 90 days and meeting all other requirements Today's Visits Date Type Provider Dept  04/23/24 Procedure visit Marcelino Nurse, MD [facility]  Showing today's visits and meeting all other requirements Future Appointments Date Type Provider Dept  06/26/24 Appointment Marcelino Nurse, MD [facility]  Showing future appointments within next 90 days and meeting all other requirements   Disposition: Discharge home  Discharge (Date  Time): 04/23/2024; 1349 hrs.   Primary Care Physician: Fernand Fredy RAMAN, MD Location: [facility] Note by: Nurse Marcelino, MD (TTS technology used. I apologize for any typographical errors that were not detected and corrected.) Date: 04/23/2024; Time: 2:00 PM  Disclaimer:  Medicine is not an visual merchandiser. The only guarantee in medicine is that nothing is guaranteed. It is important to note that the decision to proceed with this intervention was based on the information collected from the patient. The Data and conclusions were drawn from the patient's  questionnaire, the interview, and the physical examination. Because the information was provided in large part by the patient, it cannot be guaranteed that it has not been purposely or unconsciously manipulated. Every effort has been made to obtain as much relevant data as possible for this evaluation. It is important to note that the conclusions that lead to this procedure are derived in large part from the available data. Always take into account that the treatment will also be dependent on availability of resources and existing treatment guidelines, considered by other Pain Management Practitioners as being common knowledge and practice, at the time of the intervention. For Medico-Legal purposes, it is also important to point out that variation in procedural techniques and pharmacological choices are the acceptable norm. The indications, contraindications, technique, and results of the above procedure should only be interpreted and judged by a Board-Certified Interventional Pain Specialist with extensive familiarity and expertise in the same exact procedure and technique.

## 2024-04-23 NOTE — Patient Instructions (Signed)

## 2024-04-24 ENCOUNTER — Telehealth: Payer: Self-pay | Admitting: *Deleted

## 2024-04-24 NOTE — Telephone Encounter (Signed)
 Called for post procedure follow-up. Message left.

## 2024-04-25 ENCOUNTER — Other Ambulatory Visit: Payer: Self-pay | Admitting: Internal Medicine

## 2024-04-25 DIAGNOSIS — I10 Essential (primary) hypertension: Secondary | ICD-10-CM

## 2024-04-26 ENCOUNTER — Other Ambulatory Visit: Payer: Self-pay | Admitting: Internal Medicine

## 2024-04-26 DIAGNOSIS — F5101 Primary insomnia: Secondary | ICD-10-CM

## 2024-04-30 ENCOUNTER — Other Ambulatory Visit: Payer: Self-pay | Admitting: Internal Medicine

## 2024-04-30 DIAGNOSIS — F5101 Primary insomnia: Secondary | ICD-10-CM

## 2024-05-01 ENCOUNTER — Ambulatory Visit (INDEPENDENT_AMBULATORY_CARE_PROVIDER_SITE_OTHER): Admitting: Internal Medicine

## 2024-05-01 ENCOUNTER — Encounter: Payer: Self-pay | Admitting: Internal Medicine

## 2024-05-01 VITALS — BP 132/82 | HR 65 | Ht 63.0 in | Wt 182.6 lb

## 2024-05-01 DIAGNOSIS — N184 Chronic kidney disease, stage 4 (severe): Secondary | ICD-10-CM | POA: Diagnosis not present

## 2024-05-01 DIAGNOSIS — M158 Other polyosteoarthritis: Secondary | ICD-10-CM

## 2024-05-01 DIAGNOSIS — M0579 Rheumatoid arthritis with rheumatoid factor of multiple sites without organ or systems involvement: Secondary | ICD-10-CM | POA: Diagnosis not present

## 2024-05-01 DIAGNOSIS — E1165 Type 2 diabetes mellitus with hyperglycemia: Secondary | ICD-10-CM

## 2024-05-01 DIAGNOSIS — I152 Hypertension secondary to endocrine disorders: Secondary | ICD-10-CM

## 2024-05-01 DIAGNOSIS — E782 Mixed hyperlipidemia: Secondary | ICD-10-CM

## 2024-05-01 DIAGNOSIS — S92351S Displaced fracture of fifth metatarsal bone, right foot, sequela: Secondary | ICD-10-CM

## 2024-05-01 DIAGNOSIS — E1169 Type 2 diabetes mellitus with other specified complication: Secondary | ICD-10-CM

## 2024-05-01 LAB — POCT CBG (FASTING - GLUCOSE)-MANUAL ENTRY: Glucose Fasting, POC: 126 mg/dL — AB (ref 70–99)

## 2024-05-01 MED ORDER — ZOLPIDEM TARTRATE 5 MG PO TABS: 5.0000 mg | ORAL_TABLET | Freq: Every evening | ORAL | 0 refills | Status: DC | PRN

## 2024-05-01 NOTE — Progress Notes (Signed)
 Established Patient Office Visit  Subjective:  Patient ID: [name], female    DOB: April 29, 1946  Age: 78 y.o. MRN: [id number]  Chief Complaint  Patient presents with   Follow-up    Patient is here today for follow up. She reports she has been doing fairly well.   She will see nephrology ,Dr. Dennise who has been discussing dialysis with patient due to her decreasing kidney function. Patient is still against dialysis at this time. She reports still making urine and voiding throughout the day. Her most recent GFR was 12 with creatinine 3.73. Recommend early follow up with Dr.Singh.  Patient reports her Cardiologist has her on a Ziopatch at this time. She endorses feeling fatigued throughout the day. She reports sometimes not falling asleep until 0600. She report taking her Ambien  nightly. Recommend her take it every other night or reduce to 1/2 tablet nightly due to her decreased kidney function. She is currently due for refill at this time on her Ambien . Will update prescription bottle with new instructions for administration.  Patient also reports chronic neck pain. She sees a pain specialist and recommend her to FU with them and mention it at her upcoming appointment to discuss imaging and pain control options.  Recently [facility] and had fractured 5th metatarsal on right foot. She is in orthopedic boot today and FU with them today to determine if her fracture has healed.    No other concerns at this time.   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 b.)07/18/2006 -endovas oblit complex ACOM aneur. c.) Endovas Tx of 6.8x64mm RMCA aneur. d.) 3.5x55mm remnant RMCA aneur 2/2 coil compaction. e.) Interval 3.2x3.69mm saccular outpouching in ACOM c/w mild recannulization in neck. 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 g.) 3.7x21mm remnant  of prev Tx'd ACOM aneur -failed embol 02/24/2012.   CKD (chronic kidney disease), stage IV (HCC)    a.) solitary functioning kidney on the RIGHT   Complication of anesthesia    a.) delayed emergence   Coronary artery disease 02/23/2007   a.) LHC 02/23/2007 --> EF 50%; 30% pLAD, 70% mRCA --> planned for staged PCI. b.) PCI 02/27/2007: EF 60%; 3.5 x 15 mm Vision BMS to 80% mRCA. b.) LHC 07/24/2007: EF 60%; minor irregs; no occlusive CAD; no intervention. c.) LHC 10/28/2010: EF 60%; 30% mLAD, LCx with minor luminal irregs, 20% ISR mRCA; no intervention. d.) LHC 05/31/2013: EF 60%; 40% mLAD, 30% ISR m-dRCA; no interventions.   DDD (degenerative disc disease), cervical    a.) s/p ACDF C5-C7; hardware in neck; patient appreciates stiffness and issues with mobility   Diastolic dysfunction    a.)  TTE 07/07/2021: EF 87.8%; normal PASP; trace TR/MR; G1DD.   Elevated LEFT hemidiaphragm    Gout    Headache(784.0)    Heart murmur    HLD (hyperlipidemia)    HOH (hard of hearing)    Has hearing aids, doesn't wear   Hyperkalemia    Hyperparathyroidism due to renal insufficiency    Hypertension    Incomplete right bundle branch block (RBBB)    Insomnia    a.) takes zolpidem    Long term (current) use of immunomodulator    a.) on DMARD therapy (hydroxychloroquine ) for RA/SLE Dx.   Lumbar adjacent segment disease with spondylolisthesis    Multiple acquired cysts of kidney  On chronic clopidogrel  therapy    OSA (obstructive sleep apnea)    a.) does NOT use nocturnal pap therapy   Osteoarthritis    Pancreatic cyst    Peripheral vascular disease    Pneumonia    Post-COVID chronic cough    Pulmonary nodule    RBBB (right bundle branch block)    Rectal bleeding    Rheumatoid arthritis (HCC)    a.) on DMARD; hydroxychloriquine   Shortness of breath 03/01/2023   Systemic lupus erythematosus (HCC)    T2DM (type 2 diabetes mellitus) (HCC)    Tendinitis of left wrist    Thickened endometrium     Thickened endometrium    Wears dentures    partial lower    Past Surgical History:  Procedure Laterality Date   ANEURYSM COILING  08/2013   ANTERIOR LATERAL LUMBAR FUSION WITH PERCUTANEOUS SCREW 1 LEVEL N/A 03/09/2023   Procedure: L3-4 LATERAL LUMBAR INTERBODY FUSION;  Surgeon: Clois Fret, MD;  Location: [facility];  Service: Neurosurgery;  Laterality: N/A;   APPLICATION OF INTRAOPERATIVE CT SCAN N/A 03/09/2023   Procedure: APPLICATION OF INTRAOPERATIVE CT SCAN;  Surgeon: Clois Fret, MD;  Location: [facility];  Service: Neurosurgery;  Laterality: N/A;   Attempted embolization of previously treated ACOM aneurysm; unsuccessful N/A 01/28/2012   Location: [facility]   BACK SURGERY     BARTHOLIN CYST MARSUPIALIZATION     BICEPT TENODESIS Right 03/02/2022   Procedure: BICEPS TENODESIS;  Surgeon: Edie Norleen PARAS, MD;  Location: [facility];  Service: Orthopedics;  Laterality: Right;   BREAST CYST ASPIRATION Right 01/25/2012   FNA neg.   BREAST EXCISIONAL BIOPSY Left 06/26/2007   neg   BUNIONECTOMY Bilateral 2022   CARDIAC CATHETERIZATION Left 02/23/2007   Procedure: CARDIAC CATHETERIZATION; Location: [facility]; Surgeon: Denyse Bathe, MD   CARDIAC CATHETERIZATION Left 07/31/2007   Procedure: CARDIAC CATHETERIZATION; Location: [facility]; Surgeon: Denyse Bathe, MD   CARDIAC CATHETERIZATION Left 10/28/2010   Procedure: CARDIAC CATHETERIZATION; Location: [facility]; Surgeon: Denyse Bathe, MD   CARDIAC CATHETERIZATION Left 05/31/2013   Procedure: CARDIAC CATHETERIZATION; Location: [facility]; Surgeon: Denyse Bathe, MD   Carotid arteriogram; 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 N/A 07/18/2006   Location: [facility]   CATARACT EXTRACTION     CERVICAL FUSION N/A    Procedure: ACDF C5-C7   COLONOSCOPY WITH PROPOFOL  N/A 10/04/2019   Procedure: COLONOSCOPY WITH BIOPSY;  Surgeon: Unk Corinn Skiff, MD;  Location: [facility];  Service: Endoscopy;   Laterality: N/A;  Diabetic (borderline) - oral meds priority 3   CORONARY STENT INTERVENTION Left 02/27/2007   Procedure: CORONARY STENT INTERVENTION (3.5 x 15 mm Vision BMS to mRCA); Location: [facility]; Surgeon: Deatrice Cage, MD   Endovascular near complete obliteration of enlarging neck remnant of previously treated RIGHT MCA aneurysm using stent assisted coiling N/A 09/03/2010   Location: [facility]   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 N/A 10/03/2006   Location: [facility]   ESOPHAGOGASTRODUODENOSCOPY N/A 04/06/2024   Procedure: EGD (ESOPHAGOGASTRODUODENOSCOPY);  Surgeon: Rollin Dover, MD;  Location: [facility];  Service: Gastroenterology;  Laterality: N/A;   EUS N/A 04/06/2024   Procedure: ULTRASOUND, UPPER GI TRACT, ENDOSCOPIC;  Surgeon: Rollin Dover, MD;  Location: [facility];  Service: Gastroenterology;  Laterality: N/A;   FINE NEEDLE ASPIRATION  04/06/2024   Procedure: FINE NEEDLE ASPIRATION;  Surgeon: Rollin Dover, MD;  Location: [facility];  Service: Gastroenterology;;   HYSTEROSCOPY WITH D & C N/A 08/04/2021  Procedure: DILATATION AND CURETTAGE /HYSTEROSCOPY;  Surgeon: Victor Claudell SAUNDERS, MD;  Location: [facility];  Service: Gynecology;  Laterality: N/A;   POLYPECTOMY N/A 10/04/2019   Procedure: POLYPECTOMY;  Surgeon: Unk Corinn Skiff, MD;  Location: [facility];  Service: Endoscopy;  Laterality: N/A;   REVERSE SHOULDER ARTHROPLASTY Right 03/02/2022   Procedure: REVERSE SHOULDER ARTHROPLASTY;  Surgeon: Edie Norleen PARAS, MD;  Location: [facility];  Service: Orthopedics;  Laterality: Right;   UPPER ESOPHAGEAL ENDOSCOPIC ULTRASOUND (EUS) N/A 04/15/2016   Procedure: UPPER ESOPHAGEAL ENDOSCOPIC ULTRASOUND (EUS);  Surgeon: Asberry DELENA Coffee, MD;  Location: [facility];  Service: Gastroenterology;  Laterality: N/A;    Social History   Socioeconomic History   Marital status: Widowed    Spouse name: Not on file    Number of children: Not on file   Years of education: Not on file   Highest education level: Not on file  Occupational History   Not on file  Tobacco Use   Smoking status: Never    Passive exposure: Never   Smokeless tobacco: Never  Vaping Use   Vaping status: Never Used  Substance and Sexual Activity   Alcohol use: Yes    Comment: Occasionally at christmas   Drug use: No   Sexual activity: Not Currently    Birth control/protection: Post-menopausal  Other Topics Concern   Not on file  Social History Narrative   Lives with daughter   Social Drivers of Health   Financial Resource Strain: Low Risk  (02/01/2024)   Overall Financial Resource Strain (CARDIA)    Difficulty of Paying Living Expenses: Not hard at all  Food Insecurity: No Food Insecurity (04/09/2024)   Hunger Vital Sign    Worried About Running Out of Food in the Last Year: Never true    Ran Out of Food in the Last Year: Never true  Transportation Needs: No Transportation Needs (04/09/2024)   PRAPARE - Administrator, Civil Service (Medical): No    Lack of Transportation (Non-Medical): No  Physical Activity: Insufficiently Active (02/01/2024)   Exercise Vital Sign    Days of Exercise per Week: 2 days    Minutes of Exercise per Session: 30 min  Stress: No Stress Concern Present (10/11/2023)   [employer] of Occupational Health - Occupational Stress Questionnaire    Feeling of Stress : Not at all  Social Connections: Moderately Isolated (10/11/2023)   Social Connection and Isolation Panel    Frequency of Communication with Friends and Family: More than three times a week    Frequency of Social Gatherings with Friends and Family: More than three times a week    Attends Religious Services: Never    Database Administrator or Organizations: Yes    Attends Banker Meetings: Never    Marital Status: Widowed  Intimate Partner Violence: Not At Risk (04/09/2024)   Humiliation, Afraid, Rape, and  Kick questionnaire    Fear of Current or Ex-Partner: No    Emotionally Abused: No    Physically Abused: No    Sexually Abused: No    Family History  Problem Relation Age of Onset   Breast cancer Paternal Aunt 31   Breast cancer Paternal Aunt    Heart disease Mother    Heart disease Father    Stroke Father    Diabetes Sister    Diabetes Brother     Allergies  Allergen Reactions   Isosorbide Dinitrate Other (See Comments)    Collapse   Amlodipine  Swelling   Sulfa Antibiotics Itching   Tizanidine Hcl    Oxycodone  Itching    Outpatient Medications Prior to Visit  Medication Sig   albuterol  (VENTOLIN  HFA) 108 (90 Base) MCG/ACT inhaler Inhale 2 puffs into the lungs every 6 (six) hours as needed for wheezing or shortness of breath.   allopurinol  (ZYLOPRIM ) 300 MG tablet Take 300 mg by mouth daily.   aspirin  EC 81 MG tablet Take 1 tablet (81 mg total) by mouth daily. Swallow whole.   calcitRIOL (ROCALTROL) 0.25 MCG capsule Take 0.25 mcg by mouth.   carvedilol (COREG) 25 MG tablet Take 25 mg by mouth 2 (two) times daily.   citalopram  (CELEXA ) 10 MG tablet TAKE 1 TABLET BY MOUTH EVERY DAY   clopidogrel  (PLAVIX ) 75 MG tablet Take 75 mg by mouth daily.   clotrimazole -betamethasone (LOTRISONE) cream APPLY TWICE A DAY TO GROIN AREA SKIN FOR 1 WEEK AND THEN AS NEEDED.   Cyanocobalamin (B-12 PO) Take by mouth.   enalapril (VASOTEC) 5 MG tablet Take 5 mg by mouth daily.   ezetimibe  (ZETIA ) 10 MG tablet Take 1 tablet (10 mg total) by mouth daily.   fluticasone  (FLONASE ) 50 MCG/ACT nasal spray Place 2 sprays into both nostrils daily. SPRAY 2 SPRAYS INTO EACH NOSTRIL EVERY DAY   furosemide  (LASIX ) 40 MG tablet TAKE 1 TABLET BY MOUTH EVERY DAY   gabapentin  (NEURONTIN ) 100 MG capsule Take 1-3 capsules (100-300 mg total) by mouth at bedtime.   glimepiride  (AMARYL ) 2 MG tablet TAKE 1 TABLET (2 MG TOTAL) BY MOUTH SEE ADMIN INSTRUCTIONS. TAKE 1 MG DAILY, MAY INCREASE TO 2 MG IF BLOOD SUGAR IS OVER  175   hydrALAZINE  (APRESOLINE ) 100 MG tablet Take 1 tablet (100 mg total) by mouth 2 (two) times daily. (Patient taking differently: Take 100 mg by mouth daily.)   hydroxychloroquine  (PLAQUENIL ) 200 MG tablet Take 200 mg by mouth daily.   labetalol  (NORMODYNE ) 300 MG tablet TAKE 1 TABLET BY MOUTH TWICE A DAY   levocetirizine (XYZAL ) 5 MG tablet TAKE 1 TABLET BY MOUTH EVERY DAY IN THE EVENING (Patient taking differently: Take 5 mg by mouth as needed for allergies.)   losartan  (COZAAR ) 100 MG tablet TAKE 1 TABLET BY MOUTH EVERY DAY IN THE MORNING   methocarbamol  (ROBAXIN ) 500 MG tablet TAKE 1 TABLET BY MOUTH EVERY 6 HOURS AS NEEDED FOR MUSCLE SPASMS.   nitroGLYCERIN  (NITROSTAT ) 0.6 MG SL tablet TAKE 1 TABLET BY MOUTH AS NEEDED FOR CP. WAIT 5 MINUTES BEFORE NEXT DOSE. PROCEED TO ER IF NO RELIEF AFTER 3 DOSES   ondansetron  (ZOFRAN ) 4 MG tablet Take 1 tablet (4 mg total) by mouth every 8 (eight) hours as needed for nausea or vomiting.   pantoprazole (PROTONIX) 40 MG tablet TAKE 1 TABLET BY MOUTH EVERY DAY   rosuvastatin  (CRESTOR ) 40 MG tablet TAKE 1 TABLET BY MOUTH EVERY DAY   senna (SENOKOT) 8.6 MG TABS tablet Take 1 tablet (8.6 mg total) by mouth daily as needed for mild constipation.   SYMBICORT 80-4.5 MCG/ACT inhaler INHALE 1 PUFF BY MOUTH TWICE A DAY   traMADol  (ULTRAM ) 50 MG tablet Take 1 tablet (50 mg total) by mouth every 6 (six) hours as needed for moderate pain (pain score 4-6).   tretinoin (RETIN-A) 0.05 % cream APPLY TO AFFECTED AREA EVERY DAY AT BEDTIME   zolpidem  (AMBIEN ) 5 MG tablet Take 1 tablet (5 mg total) by mouth at bedtime as needed. for sleep   allopurinol  (ZYLOPRIM ) 100 MG tablet TAKE 1  TABLET BY MOUTH EVERY DAY (Patient not taking: Reported on 05/01/2024)   ciprofloxacin  (CIPRO ) 500 MG tablet Take 500 mg by mouth 2 (two) times daily. (Patient not taking: Reported on 05/01/2024)   pantoprazole (PROTONIX) 40 MG tablet Take 40 mg by mouth. (Patient not taking: Reported on  05/01/2024)   No facility-administered medications prior to visit.    Review of Systems  Constitutional:  Positive for malaise/fatigue. Negative for chills and fever.  HENT: Negative.  Negative for congestion and sore throat.   Eyes: Negative.  Negative for blurred vision and pain.  Respiratory: Negative.  Negative for cough and shortness of breath.   Cardiovascular: Negative.  Negative for chest pain, palpitations and leg swelling.  Gastrointestinal: Negative.  Negative for abdominal pain, blood in stool, constipation, diarrhea, heartburn, melena, nausea and vomiting.  Genitourinary: Negative.  Negative for dysuria, flank pain, frequency and urgency.  Musculoskeletal:  Positive for back pain, joint pain (right 5th metatarsal fracture) and neck pain. Negative for myalgias.  Skin: Negative.   Neurological: Negative.  Negative for dizziness, tingling, sensory change, weakness and headaches.  Endo/Heme/Allergies: Negative.   Psychiatric/Behavioral:  Negative for depression and suicidal ideas. The patient has insomnia. The patient is not nervous/anxious.        Objective:   BP 132/82   Pulse 65   Ht 5' 3 (1.6 m)   Wt 182 lb 9.6 oz (82.8 kg)   SpO2 98%   BMI 32.35 kg/m   Vitals:   05/01/24 1348  BP: 132/82  Pulse: 65  Height: 5' 3 (1.6 m)  Weight: 182 lb 9.6 oz (82.8 kg)  SpO2: 98%  BMI (Calculated): 32.35    Physical Exam Vitals and nursing note reviewed.  Constitutional:      Appearance: Normal appearance.  HENT:     Head: Normocephalic and atraumatic.     Nose: Nose normal.     Mouth/Throat:     Mouth: Mucous membranes are moist.     Pharynx: Oropharynx is clear.  Eyes:     Conjunctiva/sclera: Conjunctivae normal.     Pupils: Pupils are equal, round, and reactive to light.  Cardiovascular:     Rate and Rhythm: Normal rate and regular rhythm.     Pulses: Normal pulses.     Heart sounds: Normal heart sounds. No murmur heard. Pulmonary:     Effort: Pulmonary  effort is normal.     Breath sounds: Normal breath sounds. No wheezing.  Abdominal:     General: Bowel sounds are normal.     Palpations: Abdomen is soft.     Tenderness: There is no abdominal tenderness. There is no right CVA tenderness or left CVA tenderness.  Musculoskeletal:        General: Normal range of motion.     Cervical back: Normal range of motion.     Right lower leg: No edema.     Left lower leg: No edema.  Skin:    General: Skin is warm and dry.  Neurological:     General: No focal deficit present.     Mental Status: She is alert and oriented to person, place, and time.  Psychiatric:        Mood and Affect: Mood normal.        Behavior: Behavior normal.      Results for orders placed or performed in visit on 05/01/24  Microalbumin, urine  Result Value Ref Range   Microalb, Ur 218.3   Microalbumin / creatinine urine ratio  Result Value Ref Range   Microalb Creat Ratio 1,819   Protein / creatinine ratio, urine  Result Value Ref Range   Creatinine, Urine 120   POCT CBG (Fasting - Glucose)  Result Value Ref Range   Glucose Fasting, POC 126 (A) 70 - 99 mg/dL    Recent Results (from the past 2160 hours)  POCT CBG (Fasting - Glucose)     Status: Abnormal   Collection Time: 03/06/24  2:13 PM  Result Value Ref Range   Glucose Fasting, POC 320 (A) 70 - 99 mg/dL  CBC with Diff     Status: Abnormal   Collection Time: 03/06/24  3:19 PM  Result Value Ref Range   WBC 8.0 3.4 - 10.8 x10E3/uL   RBC 5.43 (H) 3.77 - 5.28 x10E6/uL   Hemoglobin 14.5 11.1 - 15.9 g/dL   Hematocrit 54.9 65.9 - 46.6 %   MCV 83 79 - 97 fL   MCH 26.7 26.6 - 33.0 pg   MCHC 32.2 31.5 - 35.7 g/dL   RDW 84.7 88.2 - 84.5 %   Platelets 370 150 - 450 x10E3/uL   Neutrophils 57 Not Estab. %   Lymphs 30 Not Estab. %   Monocytes 10 Not Estab. %   Eos 2 Not Estab. %   Basos 1 Not Estab. %   Neutrophils Absolute 4.6 1.4 - 7.0 x10E3/uL   Lymphocytes Absolute 2.4 0.7 - 3.1 x10E3/uL   Monocytes  Absolute 0.8 0.1 - 0.9 x10E3/uL   EOS (ABSOLUTE) 0.2 0.0 - 0.4 x10E3/uL   Basophils Absolute 0.1 0.0 - 0.2 x10E3/uL   Immature Granulocytes 0 Not Estab. %   Immature Grans (Abs) 0.0 0.0 - 0.1 x10E3/uL  CMP14+EGFR     Status: Abnormal   Collection Time: 03/06/24  3:19 PM  Result Value Ref Range   Glucose 76 70 - 99 mg/dL   BUN 7 (L) 8 - 27 mg/dL   Creatinine, Ser 8.83 (H) 0.57 - 1.00 mg/dL   eGFR 49 (L) >40 fO/fpw/8.26   BUN/Creatinine Ratio 6 (L) 12 - 28   Sodium 138 134 - 144 mmol/L   Potassium 3.8 3.5 - 5.2 mmol/L   Chloride 102 96 - 106 mmol/L   CO2 24 20 - 29 mmol/L   Calcium  9.7 8.7 - 10.3 mg/dL   Total Protein 7.0 6.0 - 8.5 g/dL   Albumin  4.5 3.8 - 4.8 g/dL   Globulin, Total 2.5 1.5 - 4.5 g/dL   Bilirubin Total 0.6 0.0 - 1.2 mg/dL   Alkaline Phosphatase 75 49 - 135 IU/L    Comment:               **Please note reference interval change**   AST 15 0 - 40 IU/L   ALT 11 0 - 32 IU/L  Lipid Panel w/o Chol/HDL Ratio     Status: None   Collection Time: 03/06/24  3:19 PM  Result Value Ref Range   Cholesterol, Total 131 100 - 199 mg/dL   Triglycerides 30 0 - 149 mg/dL   HDL 60 >60 mg/dL   VLDL Cholesterol Cal 8 5 - 40 mg/dL   LDL Chol Calc (NIH) 63 0 - 99 mg/dL  Hemoglobin J8r     Status: Abnormal   Collection Time: 03/06/24  3:19 PM  Result Value Ref Range   Hgb A1c MFr Bld 5.9 (H) 4.8 - 5.6 %    Comment:          Prediabetes: 5.7 - 6.4  Diabetes: >6.4          Glycemic control for adults with diabetes: <7.0    Est. average glucose Bld gHb Est-mCnc 123 mg/dL  RFE85+ZHQM     Status: Abnormal   Collection Time: 03/08/24  2:59 PM  Result Value Ref Range   Glucose 93 70 - 99 mg/dL   BUN 62 (H) 8 - 27 mg/dL   Creatinine, Ser 6.26 (H) 0.57 - 1.00 mg/dL   eGFR 12 (L) >40 fO/fpw/8.26   BUN/Creatinine Ratio 17 12 - 28   Sodium 145 (H) 134 - 144 mmol/L   Potassium 5.5 (H) 3.5 - 5.2 mmol/L   Chloride 113 (H) 96 - 106 mmol/L   CO2 17 (L) 20 - 29 mmol/L   Calcium  9.4  8.7 - 10.3 mg/dL   Total Protein 6.2 6.0 - 8.5 g/dL   Albumin  4.1 3.8 - 4.8 g/dL   Globulin, Total 2.1 1.5 - 4.5 g/dL   Bilirubin Total 0.6 0.0 - 1.2 mg/dL   Alkaline Phosphatase 75 49 - 135 IU/L    Comment:               **Please note reference interval change**   AST 12 0 - 40 IU/L   ALT 10 0 - 32 IU/L  CBC with Differential/Platelet     Status: Abnormal   Collection Time: 03/08/24  2:59 PM  Result Value Ref Range   WBC 9.4 3.4 - 10.8 x10E3/uL   RBC 3.09 (L) 3.77 - 5.28 x10E6/uL   Hemoglobin 9.1 (L) 11.1 - 15.9 g/dL   Hematocrit 70.6 (L) 65.9 - 46.6 %   MCV 95 79 - 97 fL   MCH 29.4 26.6 - 33.0 pg   MCHC 31.1 (L) 31.5 - 35.7 g/dL   RDW 86.9 88.2 - 84.5 %   Platelets 177 150 - 450 x10E3/uL   Neutrophils 82 Not Estab. %   Lymphs 13 Not Estab. %   Monocytes 5 Not Estab. %   Eos 0 Not Estab. %   Basos 0 Not Estab. %   Neutrophils Absolute 7.7 (H) 1.4 - 7.0 x10E3/uL   Lymphocytes Absolute 1.3 0.7 - 3.1 x10E3/uL   Monocytes Absolute 0.5 0.1 - 0.9 x10E3/uL   EOS (ABSOLUTE) 0.0 0.0 - 0.4 x10E3/uL   Basophils Absolute 0.0 0.0 - 0.2 x10E3/uL   Immature Granulocytes 0 Not Estab. %   Immature Grans (Abs) 0.0 0.0 - 0.1 x10E3/uL  Lipid Panel w/o Chol/HDL Ratio     Status: Abnormal   Collection Time: 03/08/24  2:59 PM  Result Value Ref Range   Cholesterol, Total 205 (H) 100 - 199 mg/dL   Triglycerides 877 0 - 149 mg/dL   HDL 54 >60 mg/dL   VLDL Cholesterol Cal 22 5 - 40 mg/dL   LDL Chol Calc (NIH) 870 (H) 0 - 99 mg/dL  Hemoglobin J8r     Status: Abnormal   Collection Time: 03/08/24  2:59 PM  Result Value Ref Range   Hgb A1c MFr Bld 5.9 (H) 4.8 - 5.6 %    Comment:          Prediabetes: 5.7 - 6.4          Diabetes: >6.4          Glycemic control for adults with diabetes: <7.0    Est. average glucose Bld gHb Est-mCnc 123 mg/dL  ANA, IFA (with reflex)     Status: Abnormal   Collection Time: 03/08/24  3:06 PM  Result Value  Ref Range   ANA Titer 1 Positive (A)     Comment:                                       Negative   <1:80                                      Borderline  1:80                                      Positive   >1:80   FANA Staining Patterns     Status: None   Collection Time: 03/08/24  3:06 PM  Result Value Ref Range   Speckled Pattern 1:80     Comment: ICAP nomenclature: AC-2,4,5,29   Note: Comment     Comment: Pattern              Potential Disease Association -------------  --------------------------------------------- Homogeneous    Systemic Lupus Erythematosus, Drug Induced                Systemic Lupus Erythematosus, Chronic                Autoimmune hepatitis, Juvenile Idiopathic                Arthritis -------------  --------------------------------------------- Speckled       Sjogren Syndrome, Systemic Lupus                Erythematosus, Subacute Cutaneous Lupus,                Neonatal Lupus, Congenital Heart Block,                Mixed Connective Tissue Disease,                Scleroderma-diffuse, Scleroderma-Autoimmune                Myositis Overlap Syndrome, Systemic Lupus                Erythematosus-Scleroderma-Autoimmune                Myositis Overlap Syndrome, Systemic                Autoimmune Rheumatic Disease,                Undifferentiated Connective Tissue Disease -------------  --------------------------------------------- Nucleolar      Systemic [place] lerosis, Scleroderma-Autoimmune                Myositis Overlap Syndrome, Sjogren                Syndrome, Raynaud phenomenon, Pulmonary                Arterial Hypertension, Systemic Autoimmune                Rheumatic Disease, Cancer -------------  --------------------------------------------- Centromere     Scleroderma-CREST, Limited Cutaneous SSc,                Raynaud's Phenomenon, Primary Biliary                Cholangitis -------------  --------------------------------------------- Nuclear Dot    Primary Biliary Cholangitis -------------   --------------------------------------------- Nuclear  Primary Biliary Cholangitis, Autoimmune Membrane       Hepatitis/Liver disease, Systemic Autoimmune                Rheumatic Disease, Autoimmune Cytopenias,                Linear Scleroderma, Antiphospholipid Syndrome -------------  ---------------------------------------------   POCT CBG (Fasting - Glucose)     Status: Abnormal   Collection Time: 03/20/24  1:23 PM  Result Value Ref Range   Glucose Fasting, POC 190 (A) 70 - 99 mg/dL  Glucose, capillary     Status: Abnormal   Collection Time: 04/06/24  9:30 AM  Result Value Ref Range   Glucose-Capillary 124 (H) 70 - 99 mg/dL    Comment: Glucose reference range applies only to samples taken after fasting for at least 8 hours.  POCT CBG (Fasting - Glucose)     Status: Abnormal   Collection Time: 05/01/24  1:54 PM  Result Value Ref Range   Glucose Fasting, POC 126 (A) 70 - 99 mg/dL      Assessment & Plan:  Refill sent for Ambien . Continue other medications as prescribed. Keep specialists appointments as scheduled. Problem List Items Addressed This Visit     Osteoarthritis   Rheumatoid arthritis involving multiple sites with positive rheumatoid factor (HCC)   Type 2 diabetes mellitus with hyperglycemia, without long-term current use of insulin  (HCC)   Relevant Orders   POCT CBG (Fasting - Glucose) (Completed)   Hypertension associated with diabetes (HCC) - Primary   Combined hyperlipidemia associated with type 2 diabetes mellitus (HCC)   Chronic kidney disease, stage IV (severe) (HCC)   Right foot pain    Return in about 3 months (around 08/01/2024).   Total time spent: 20 minutes. This time includes review of previous notes and results and patient face to face interaction during today's visit.    FERNAND FREDY RAMAN, MD  05/01/2024   This document may have been prepared by [employer] [facility] Voice Recognition software and as such may include unintentional dictation errors.

## 2024-05-07 ENCOUNTER — Other Ambulatory Visit: Payer: Self-pay

## 2024-05-07 NOTE — Patient Instructions (Signed)
 Visit Information  Ms. Breault was given information about Medicaid Managed Care team care coordination services as a part of their [facility] Medicaid benefit.   If you would like to schedule transportation through your [facility], please call the following number at least 2 days in advance of your appointment: [phone]   Rides for urgent appointments can also be made after hours by calling Member Services.  Call the Behavioral Health Crisis Line at [phone], at any time, 24 hours a day, 7 days a week. If you are in danger or need immediate medical attention call 911.  Please see education materials related to Diabetes  provided by MyChart link.  Care plan and visit instructions communicated with the patient verbally today. Patient agrees to receive a copy in MyChart. Active MyChart status and patient understanding of how to access instructions and care plan via MyChart confirmed with patient.     Telephone follow up appointment with Managed Medicaid care management team member scheduled for: 06-06-2024 at 1:00 PM   Hendricks Her RN, BSN  [place] [facility] RN Case Manager   Direct [phone]   Following is a copy of your plan of care:  There are no care plans that you recently modified to display for this patient.

## 2024-05-10 ENCOUNTER — Ambulatory Visit: Admitting: Internal Medicine

## 2024-05-18 DIAGNOSIS — R002 Palpitations: Secondary | ICD-10-CM

## 2024-05-21 ENCOUNTER — Ambulatory Visit: Payer: Self-pay | Admitting: Physician Assistant

## 2024-05-22 ENCOUNTER — Other Ambulatory Visit: Payer: Self-pay | Admitting: Internal Medicine

## 2024-05-22 DIAGNOSIS — E782 Mixed hyperlipidemia: Secondary | ICD-10-CM

## 2024-05-31 ENCOUNTER — Other Ambulatory Visit: Payer: Self-pay | Admitting: Internal Medicine

## 2024-05-31 DIAGNOSIS — F411 Generalized anxiety disorder: Secondary | ICD-10-CM

## 2024-06-05 ENCOUNTER — Encounter: Payer: Self-pay | Admitting: Internal Medicine

## 2024-06-05 ENCOUNTER — Ambulatory Visit
Admission: RE | Admit: 2024-06-05 | Discharge: 2024-06-05 | Disposition: A | Attending: Internal Medicine | Admitting: Internal Medicine

## 2024-06-05 ENCOUNTER — Ambulatory Visit: Payer: Self-pay | Admitting: Internal Medicine

## 2024-06-05 ENCOUNTER — Ambulatory Visit
Admission: RE | Admit: 2024-06-05 | Discharge: 2024-06-05 | Disposition: A | Source: Ambulatory Visit | Attending: Internal Medicine | Admitting: Internal Medicine

## 2024-06-05 ENCOUNTER — Ambulatory Visit: Admitting: Internal Medicine

## 2024-06-05 VITALS — BP 120/70 | HR 73 | Ht 63.0 in | Wt 182.0 lb

## 2024-06-05 DIAGNOSIS — N185 Chronic kidney disease, stage 5: Secondary | ICD-10-CM | POA: Insufficient documentation

## 2024-06-05 DIAGNOSIS — G8929 Other chronic pain: Secondary | ICD-10-CM | POA: Insufficient documentation

## 2024-06-05 DIAGNOSIS — E1159 Type 2 diabetes mellitus with other circulatory complications: Secondary | ICD-10-CM | POA: Diagnosis not present

## 2024-06-05 DIAGNOSIS — R10A1 Flank pain, right side: Secondary | ICD-10-CM | POA: Insufficient documentation

## 2024-06-05 DIAGNOSIS — E1169 Type 2 diabetes mellitus with other specified complication: Secondary | ICD-10-CM

## 2024-06-05 DIAGNOSIS — N390 Urinary tract infection, site not specified: Secondary | ICD-10-CM

## 2024-06-05 DIAGNOSIS — E782 Mixed hyperlipidemia: Secondary | ICD-10-CM | POA: Diagnosis not present

## 2024-06-05 DIAGNOSIS — M545 Low back pain, unspecified: Secondary | ICD-10-CM | POA: Diagnosis present

## 2024-06-05 DIAGNOSIS — I152 Hypertension secondary to endocrine disorders: Secondary | ICD-10-CM

## 2024-06-05 DIAGNOSIS — E1165 Type 2 diabetes mellitus with hyperglycemia: Secondary | ICD-10-CM | POA: Diagnosis not present

## 2024-06-05 DIAGNOSIS — E66811 Obesity, class 1: Secondary | ICD-10-CM | POA: Insufficient documentation

## 2024-06-05 LAB — POCT URINALYSIS DIPSTICK
Bilirubin, UA: NEGATIVE
Blood, UA: NEGATIVE
Glucose, UA: NEGATIVE
Ketones, UA: NEGATIVE
Nitrite, UA: POSITIVE
Protein, UA: POSITIVE — AB
Spec Grav, UA: 1.025 (ref 1.010–1.025)
Urobilinogen, UA: 0.2 U/dL
pH, UA: 5 (ref 5.0–8.0)

## 2024-06-05 LAB — POCT CBG (FASTING - GLUCOSE)-MANUAL ENTRY: Glucose Fasting, POC: 122 mg/dL — AB (ref 70–99)

## 2024-06-05 NOTE — Progress Notes (Signed)
 Established Patient Office Visit  Subjective:  Patient ID: [name], female    DOB: 03-Jul-1945  Age: 78 y.o. MRN: [id number]  Chief Complaint  Patient presents with   Back Pain    Patient is here today for FU. She reports feeling poor today.  On 05/01/24 orthopedic specialist advised patient she could return to regular shoe as needed from healing fracture and to FU in 3 weeks. She has not gone back as her back pain has prevented her from getting around well. She reports taking gabapentin  and it has helped mildly but not caused symptom resolution. She reports she has not taken any of the PRN tramadol  she has on her medication left. She reports she has not been having any pain with ambulation since wearing a normal show again. She does endorse some BLE edema and is taking her lasix  as prescribed.  She had back pain injection 05/01/24 chronic back pain without relief at her pain specialist. She is scheduled to go back 07/01/24. Order lumbar xray today and do labs prior to initiating any medications. Advised patient if she is in this much pain she should call her pain specialist to get a sooner appointment.  She reports having standing and walking cause her more discomfort so she has been using her cane. She reports making urine and denies any burning or pain with urination. She denies decreased urine volume. Patient has 1 kidney located on the right side and endorses right sided flank/lumbar pain. Will check UA today. UA showed small pus cells and positive for nitrates. Will send for culture and defer empiric treatment due to CKD stage 5.  Patient is due for routine blood work and will get labs drawn today. She denies chest pain, shortness of breath, palpitations, abdominal distress.  Back Pain Pertinent negatives include no abdominal pain, chest pain, dysuria, fever, headaches, tingling or weakness.    No other concerns at this time.   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 b.)07/18/2006 -endovas oblit complex ACOM aneur. c.) Endovas Tx of 6.8x18mm RMCA aneur. d.) 3.5x67mm remnant RMCA aneur 2/2 coil compaction. e.) Interval 3.2x3.93mm saccular outpouching in ACOM c/w mild recannulization in neck. 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 g.) 3.7x48mm remnant of prev Tx'd ACOM aneur -failed embol 02/24/2012.   CKD (chronic kidney disease), stage IV (HCC)    a.) solitary functioning kidney on the RIGHT   Complication of anesthesia    a.) delayed emergence   Coronary artery disease 02/23/2007   a.) LHC 02/23/2007 --> EF 50%; 30% pLAD, 70% mRCA --> planned for staged PCI. b.) PCI 02/27/2007: EF 60%; 3.5 x 15 mm Vision BMS to 80% mRCA. b.) LHC 07/24/2007: EF 60%; minor irregs; no occlusive CAD; no intervention. c.) LHC 10/28/2010: EF 60%; 30% mLAD, LCx with minor luminal irregs, 20% ISR mRCA; no intervention. d.) LHC 05/31/2013: EF 60%; 40% mLAD, 30% ISR m-dRCA; no interventions.   DDD (degenerative disc disease), cervical    a.) s/p ACDF C5-C7; hardware in neck; patient appreciates stiffness and issues with mobility   Diastolic dysfunction    a.)  TTE 07/07/2021: EF 87.8%; normal PASP; trace TR/MR; G1DD.   Elevated LEFT hemidiaphragm    Gout    Headache(784.0)    Heart murmur    HLD (hyperlipidemia)    HOH (hard of  hearing)    Has hearing aids, doesn't wear   Hyperkalemia    Hyperparathyroidism due to renal insufficiency    Hypertension    Incomplete right bundle branch block (RBBB)    Insomnia    a.) takes zolpidem    Long term (current) use of immunomodulator    a.) on DMARD therapy (hydroxychloroquine ) for RA/SLE Dx.   Lumbar adjacent segment disease with spondylolisthesis    Multiple acquired cysts of kidney    On chronic clopidogrel  therapy    OSA (obstructive sleep apnea)    a.) does NOT use nocturnal pap therapy    Osteoarthritis    Pancreatic cyst    Peripheral vascular disease    Pneumonia    Post-COVID chronic cough    Pulmonary nodule    RBBB (right bundle branch block)    Rectal bleeding    Rheumatoid arthritis (HCC)    a.) on DMARD; hydroxychloriquine   Shortness of breath 03/01/2023   Systemic lupus erythematosus (HCC)    T2DM (type 2 diabetes mellitus) (HCC)    Tendinitis of left wrist    Thickened endometrium    Thickened endometrium    Wears dentures    partial lower    Past Surgical History:  Procedure Laterality Date   ANEURYSM COILING  08/2013   ANTERIOR LATERAL LUMBAR FUSION WITH PERCUTANEOUS SCREW 1 LEVEL N/A 03/09/2023   Procedure: L3-4 LATERAL LUMBAR INTERBODY FUSION;  Surgeon: Clois Fret, MD;  Location: [facility];  Service: Neurosurgery;  Laterality: N/A;   APPLICATION OF INTRAOPERATIVE CT SCAN N/A 03/09/2023   Procedure: APPLICATION OF INTRAOPERATIVE CT SCAN;  Surgeon: Clois Fret, MD;  Location: [facility];  Service: Neurosurgery;  Laterality: N/A;   Attempted embolization of previously treated ACOM aneurysm; unsuccessful N/A 01/28/2012   Location[facility]   BACK SURGERY     BARTHOLIN CYST MARSUPIALIZATION     BICEPT TENODESIS Right 03/02/2022   Procedure: BICEPS TENODESIS;  Surgeon: Edie Norleen PARAS, MD;  Location: [facility];  Service: Orthopedics;  Laterality: Right;   BREAST CYST ASPIRATION Right 01/25/2012   FNA neg.   BREAST EXCISIONAL BIOPSY Left 06/26/2007   neg   BUNIONECTOMY Bilateral 2022   CARDIAC CATHETERIZATION Left 02/23/2007   Procedure: CARDIAC CATHETERIZATION; Location: [facility]; Surgeon: Denyse Bathe, MD   CARDIAC CATHETERIZATION Left 07/31/2007   Procedure: CARDIAC CATHETERIZATION; Location: [facility]; Surgeon: Denyse Bathe, MD   CARDIAC CATHETERIZATION Left 10/28/2010   Procedure: CARDIAC CATHETERIZATION; Location: [facility]; Surgeon: Denyse Bathe, MD   CARDIAC CATHETERIZATION Left 05/31/2013   Procedure: CARDIAC CATHETERIZATION;  Location: [facility]; Surgeon: Denyse Bathe, MD   Carotid arteriogram; 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 N/A 07/18/2006   Location: [facility]   CATARACT EXTRACTION     CERVICAL FUSION N/A    Procedure: ACDF C5-C7   COLONOSCOPY WITH PROPOFOL  N/A 10/04/2019   Procedure: COLONOSCOPY WITH BIOPSY;  Surgeon: Unk Corinn Skiff, MD;  Location: [facility];  Service: Endoscopy;  Laterality: N/A;  Diabetic (borderline) - oral meds priority 3   CORONARY STENT INTERVENTION Left 02/27/2007   Procedure: CORONARY STENT INTERVENTION (3.5 x 15 mm Vision BMS to mRCA); Location: [facility]; Surgeon: Deatrice Cage, MD   Endovascular near complete obliteration of enlarging neck remnant of previously treated RIGHT MCA aneurysm using stent assisted coiling N/A 09/03/2010   Location: [facility]   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 N/A 10/03/2006   Location: [facility]   ESOPHAGOGASTRODUODENOSCOPY N/A 04/06/2024   Procedure:  EGD (ESOPHAGOGASTRODUODENOSCOPY);  Surgeon: Rollin Dover, MD;  Location: [facility];  Service: Gastroenterology;  Laterality: N/A;   EUS N/A 04/06/2024   Procedure: ULTRASOUND, UPPER GI TRACT, ENDOSCOPIC;  Surgeon: Rollin Dover, MD;  Location: [facility];  Service: Gastroenterology;  Laterality: N/A;   FINE NEEDLE ASPIRATION  04/06/2024   Procedure: FINE NEEDLE ASPIRATION;  Surgeon: Rollin Dover, MD;  Location: [facility];  Service: Gastroenterology;;   HYSTEROSCOPY WITH D & C N/A 08/04/2021   Procedure: DILATATION AND CURETTAGE /HYSTEROSCOPY;  Surgeon: Victor Claudell SAUNDERS, MD;  Location: [facility];  Service: Gynecology;  Laterality: N/A;   POLYPECTOMY N/A 10/04/2019   Procedure: POLYPECTOMY;  Surgeon: Unk Corinn Skiff, MD;  Location: [facility];  Service: Endoscopy;  Laterality: N/A;   REVERSE SHOULDER ARTHROPLASTY Right 03/02/2022   Procedure: REVERSE SHOULDER  ARTHROPLASTY;  Surgeon: Edie Norleen PARAS, MD;  Location: [facility];  Service: Orthopedics;  Laterality: Right;   UPPER ESOPHAGEAL ENDOSCOPIC ULTRASOUND (EUS) N/A 04/15/2016   Procedure: UPPER ESOPHAGEAL ENDOSCOPIC ULTRASOUND (EUS);  Surgeon: Asberry DELENA Coffee, MD;  Location: [facility];  Service: Gastroenterology;  Laterality: N/A;    Social History   Socioeconomic History   Marital status: Widowed    Spouse name: Not on file   Number of children: Not on file   Years of education: Not on file   Highest education level: Not on file  Occupational History   Not on file  Tobacco Use   Smoking status: Never    Passive exposure: Never   Smokeless tobacco: Never  Vaping Use   Vaping status: Never Used  Substance and Sexual Activity   Alcohol use: Yes    Comment: Occasionally at christmas   Drug use: No   Sexual activity: Not Currently    Birth control/protection: Post-menopausal  Other Topics Concern   Not on file  Social History Narrative   Lives with daughter   Social Drivers of Health   Tobacco Use: Low Risk (06/05/2024)   Patient History    Smoking Tobacco Use: Never    Smokeless Tobacco Use: Never    Passive Exposure: Never  Financial Resource Strain: Low Risk (02/01/2024)   Overall Financial Resource Strain (CARDIA)    Difficulty of Paying Living Expenses: Not hard at all  Food Insecurity: No Food Insecurity (05/07/2024)   Epic    Worried About Programme Researcher, Broadcasting/film/video in the Last Year: Never true    Ran Out of Food in the Last Year: Never true  Transportation Needs: No Transportation Needs (05/07/2024)   Epic    Lack of Transportation (Medical): No    Lack of Transportation (Non-Medical): No  Physical Activity: Insufficiently Active (02/01/2024)   Exercise Vital Sign    Days of Exercise per Week: 2 days    Minutes of Exercise per Session: 30 min  Stress: No Stress Concern Present (10/11/2023)   [employer] of Occupational Health - Occupational Stress  Questionnaire    Feeling of Stress : Not at all  Social Connections: Moderately Isolated (10/11/2023)   Social Connection and Isolation Panel    Frequency of Communication with Friends and Family: More than three times a week    Frequency of Social Gatherings with Friends and Family: More than three times a week    Attends Religious Services: Never    Database Administrator or Organizations: Yes    Attends Banker Meetings: Never    Marital Status: Widowed  Intimate Partner Violence: Not At Risk (05/07/2024)  Epic    Fear of Current or Ex-Partner: No    Emotionally Abused: No    Physically Abused: No    Sexually Abused: No  Depression (PHQ2-9): Low Risk (05/07/2024)   Depression (PHQ2-9)    PHQ-2 Score: 0  Alcohol Screen: Low Risk (05/31/2023)   Alcohol Screen    Last Alcohol Screening Score (AUDIT): 0  Housing: Low Risk (05/07/2024)   Epic    Unable to Pay for Housing in the Last Year: No    Number of Times Moved in the Last Year: 0    Homeless in the Last Year: No  Utilities: Not At Risk (05/07/2024)   Epic    Threatened with loss of utilities: No  Health Literacy: Adequate Health Literacy (02/01/2024)   B1300 Health Literacy    Frequency of need for help with medical instructions: Never    Family History  Problem Relation Age of Onset   Breast cancer Paternal Aunt 50   Breast cancer Paternal Aunt    Heart disease Mother    Heart disease Father    Stroke Father    Diabetes Sister    Diabetes Brother     Allergies[1]  Show/hide medication list[2]  Review of Systems  Constitutional: Negative.  Negative for chills, fever and malaise/fatigue.  HENT: Negative.  Negative for congestion and sore throat.   Eyes: Negative.  Negative for blurred vision and pain.  Respiratory: Negative.  Negative for cough and shortness of breath.   Cardiovascular: Negative.  Negative for chest pain, palpitations and leg swelling.  Gastrointestinal: Negative.  Negative for  abdominal pain, blood in stool, constipation, diarrhea, heartburn, melena, nausea and vomiting.  Genitourinary:  Positive for flank pain (right side). Negative for dysuria, frequency and urgency.  Musculoskeletal:  Positive for back pain (lumbar). Negative for joint pain and myalgias.  Skin: Negative.   Neurological: Negative.  Negative for dizziness, tingling, sensory change, weakness and headaches.  Endo/Heme/Allergies: Negative.   Psychiatric/Behavioral: Negative.  Negative for depression and suicidal ideas. The patient is not nervous/anxious.        Objective:   BP 120/70   Pulse 73   Ht 5' 3 (1.6 m)   Wt 182 lb (82.6 kg)   SpO2 97%   BMI 32.24 kg/m   Vitals:   06/05/24 1327  BP: 120/70  Pulse: 73  Height: 5' 3 (1.6 m)  Weight: 182 lb (82.6 kg)  SpO2: 97%  BMI (Calculated): 32.25    Physical Exam Vitals and nursing note reviewed.  Constitutional:      Appearance: Normal appearance.  HENT:     Head: Normocephalic and atraumatic.     Nose: Nose normal.     Mouth/Throat:     Mouth: Mucous membranes are moist.     Pharynx: Oropharynx is clear.  Eyes:     Conjunctiva/sclera: Conjunctivae normal.     Pupils: Pupils are equal, round, and reactive to light.  Cardiovascular:     Rate and Rhythm: Normal rate and regular rhythm.     Pulses: Normal pulses.     Heart sounds: Normal heart sounds. No murmur heard. Pulmonary:     Effort: Pulmonary effort is normal.     Breath sounds: Normal breath sounds. No wheezing.  Abdominal:     General: Bowel sounds are normal.     Palpations: Abdomen is soft.     Tenderness: There is no abdominal tenderness. There is no right CVA tenderness or left CVA tenderness.  Musculoskeletal:  General: Normal range of motion.     Cervical back: Normal range of motion.     Lumbar back: Tenderness (right side) present.     Right lower leg: No edema.     Left lower leg: No edema.  Skin:    General: Skin is warm and dry.   Neurological:     General: No focal deficit present.     Mental Status: She is alert and oriented to person, place, and time.  Psychiatric:        Mood and Affect: Mood normal.        Behavior: Behavior normal.      Results for orders placed or performed in visit on 06/05/24  POCT CBG (Fasting - Glucose)  Result Value Ref Range   Glucose Fasting, POC 122 (A) 70 - 99 mg/dL  POCT Urinalysis Dipstick (81002)  Result Value Ref Range   Color, UA Yellow    Clarity, UA Very cloudy    Glucose, UA Negative Negative   Bilirubin, UA Negative    Ketones, UA Negative    Spec Grav, UA 1.025 1.010 - 1.025   Blood, UA Negative    pH, UA 5.0 5.0 - 8.0   Protein, UA Positive (A) Negative   Urobilinogen, UA 0.2 0.2 or 1.0 E.U./dL   Nitrite, UA Positive    Leukocytes, UA Small (1+) (A) Negative   Appearance Very cloudy    Odor Yes     Recent Results (from the past 2160 hours)  CMP14+EGFR     Status: Abnormal   Collection Time: 03/08/24  2:59 PM  Result Value Ref Range   Glucose 93 70 - 99 mg/dL   BUN 62 (H) 8 - 27 mg/dL   Creatinine, Ser 6.26 (H) 0.57 - 1.00 mg/dL   eGFR 12 (L) >40 fO/fpw/8.26   BUN/Creatinine Ratio 17 12 - 28   Sodium 145 (H) 134 - 144 mmol/L   Potassium 5.5 (H) 3.5 - 5.2 mmol/L   Chloride 113 (H) 96 - 106 mmol/L   CO2 17 (L) 20 - 29 mmol/L   Calcium  9.4 8.7 - 10.3 mg/dL   Total Protein 6.2 6.0 - 8.5 g/dL   Albumin  4.1 3.8 - 4.8 g/dL   Globulin, Total 2.1 1.5 - 4.5 g/dL   Bilirubin Total 0.6 0.0 - 1.2 mg/dL   Alkaline Phosphatase 75 49 - 135 IU/L    Comment:               **Please note reference interval change**   AST 12 0 - 40 IU/L   ALT 10 0 - 32 IU/L  CBC with Differential/Platelet     Status: Abnormal   Collection Time: 03/08/24  2:59 PM  Result Value Ref Range   WBC 9.4 3.4 - 10.8 x10E3/uL   RBC 3.09 (L) 3.77 - 5.28 x10E6/uL   Hemoglobin 9.1 (L) 11.1 - 15.9 g/dL   Hematocrit 70.6 (L) 65.9 - 46.6 %   MCV 95 79 - 97 fL   MCH 29.4 26.6 - 33.0 pg    MCHC 31.1 (L) 31.5 - 35.7 g/dL   RDW 86.9 88.2 - 84.5 %   Platelets 177 150 - 450 x10E3/uL   Neutrophils 82 Not Estab. %   Lymphs 13 Not Estab. %   Monocytes 5 Not Estab. %   Eos 0 Not Estab. %   Basos 0 Not Estab. %   Neutrophils Absolute 7.7 (H) 1.4 - 7.0 x10E3/uL   Lymphocytes Absolute 1.3 0.7 -  3.1 x10E3/uL   Monocytes Absolute 0.5 0.1 - 0.9 x10E3/uL   EOS (ABSOLUTE) 0.0 0.0 - 0.4 x10E3/uL   Basophils Absolute 0.0 0.0 - 0.2 x10E3/uL   Immature Granulocytes 0 Not Estab. %   Immature Grans (Abs) 0.0 0.0 - 0.1 x10E3/uL  Lipid Panel w/o Chol/HDL Ratio     Status: Abnormal   Collection Time: 03/08/24  2:59 PM  Result Value Ref Range   Cholesterol, Total 205 (H) 100 - 199 mg/dL   Triglycerides 877 0 - 149 mg/dL   HDL 54 >60 mg/dL   VLDL Cholesterol Cal 22 5 - 40 mg/dL   LDL Chol Calc (NIH) 870 (H) 0 - 99 mg/dL  Hemoglobin J8r     Status: Abnormal   Collection Time: 03/08/24  2:59 PM  Result Value Ref Range   Hgb A1c MFr Bld 5.9 (H) 4.8 - 5.6 %    Comment:          Prediabetes: 5.7 - 6.4          Diabetes: >6.4          Glycemic control for adults with diabetes: <7.0    Est. average glucose Bld gHb Est-mCnc 123 mg/dL  ANA, IFA (with reflex)     Status: Abnormal   Collection Time: 03/08/24  3:06 PM  Result Value Ref Range   ANA Titer 1 Positive (A)     Comment:                                      Negative   <1:80                                      Borderline  1:80                                      Positive   >1:80   FANA Staining Patterns     Status: None   Collection Time: 03/08/24  3:06 PM  Result Value Ref Range   Speckled Pattern 1:80     Comment: ICAP nomenclature: AC-2,4,5,29   Note: Comment     Comment: Pattern              Potential Disease Association -------------  --------------------------------------------- Homogeneous    Systemic Lupus Erythematosus, Drug Induced                Systemic Lupus Erythematosus, Chronic                Autoimmune hepatitis,  Juvenile Idiopathic                Arthritis -------------  --------------------------------------------- Speckled       Sjogren Syndrome, Systemic Lupus                Erythematosus, Subacute Cutaneous Lupus,                Neonatal Lupus, Congenital Heart Block,                Mixed Connective Tissue Disease,                Scleroderma-diffuse, Scleroderma-Autoimmune                Myositis  Overlap Syndrome, Systemic Lupus                Erythematosus-Scleroderma-Autoimmune                Myositis Overlap Syndrome, Systemic                Autoimmune Rheumatic Disease,                Undifferentiated Connective Tissue Disease -------------  --------------------------------------------- Nucleolar      Systemic Searchlight lerosis, Scleroderma-Autoimmune                Myositis Overlap Syndrome, Sjogren                Syndrome, Raynaud phenomenon, Pulmonary                Arterial Hypertension, Systemic Autoimmune                Rheumatic Disease, Cancer -------------  --------------------------------------------- Centromere     Scleroderma-CREST, Limited Cutaneous SSc,                Raynaud's Phenomenon, Primary Biliary                Cholangitis -------------  --------------------------------------------- Nuclear Dot    Primary Biliary Cholangitis -------------  --------------------------------------------- Nuclear        Primary Biliary Cholangitis, Autoimmune Membrane       Hepatitis/Liver disease, Systemic Autoimmune                Rheumatic Disease, Autoimmune Cytopenias,                Linear Scleroderma, Antiphospholipid Syndrome -------------  ---------------------------------------------   POCT CBG (Fasting - Glucose)     Status: Abnormal   Collection Time: 03/20/24  1:23 PM  Result Value Ref Range   Glucose Fasting, POC 190 (A) 70 - 99 mg/dL  Glucose, capillary     Status: Abnormal   Collection Time: 04/06/24  9:30 AM  Result Value Ref Range   Glucose-Capillary 124 (H) 70 -  99 mg/dL    Comment: Glucose reference range applies only to samples taken after fasting for at least 8 hours.  POCT CBG (Fasting - Glucose)     Status: Abnormal   Collection Time: 05/01/24  1:54 PM  Result Value Ref Range   Glucose Fasting, POC 126 (A) 70 - 99 mg/dL  POCT CBG (Fasting - Glucose)     Status: Abnormal   Collection Time: 06/05/24  1:34 PM  Result Value Ref Range   Glucose Fasting, POC 122 (A) 70 - 99 mg/dL  POCT Urinalysis Dipstick (18997)     Status: Abnormal   Collection Time: 06/05/24  2:11 PM  Result Value Ref Range   Color, UA Yellow    Clarity, UA Very cloudy    Glucose, UA Negative Negative   Bilirubin, UA Negative    Ketones, UA Negative    Spec Grav, UA 1.025 1.010 - 1.025   Blood, UA Negative    pH, UA 5.0 5.0 - 8.0   Protein, UA Positive (A) Negative   Urobilinogen, UA 0.2 0.2 or 1.0 E.U./dL   Nitrite, UA Positive    Leukocytes, UA Small (1+) (A) Negative   Appearance Very cloudy    Odor Yes       Assessment & Plan:  Lumbar xray ordered. Check routine blood work today and FU with patient on results. Check UA. Continue other medications as prescribed. Advised patient  to reach out to her pain specialist for worsening chronic pain. Reinforced healthy diet and exercise as tolerated. Problem List Items Addressed This Visit       Cardiovascular and Mediastinum   Hypertension associated with diabetes (HCC)   Relevant Orders   CMP14+EGFR   CBC with Differential/Platelet     Endocrine   Type 2 diabetes mellitus with hyperglycemia, without long-term current use of insulin  (HCC) - Primary   Relevant Orders   POCT CBG (Fasting - Glucose) (Completed)   Hemoglobin A1c   Combined hyperlipidemia associated with type 2 diabetes mellitus (HCC)   Relevant Orders   CMP14+EGFR   Lipid Panel w/o Chol/HDL Ratio     Genitourinary   CKD (chronic kidney disease) stage 5, GFR less than 15 ml/min (HCC)   Relevant Orders   POCT Urinalysis Dipstick (18997)  (Completed)     Other   Right flank pain   Relevant Orders   DG Lumbar Spine Complete   Chronic right-sided low back pain without sciatica   Relevant Orders   DG Lumbar Spine Complete   Class 1 obesity due to excess calories with serious comorbidity and body mass index (BMI) of 32.0 to 32.9 in adult    Return in about 3 days (around 06/08/2024).   Total time spent: 25 minutes. This time includes review of previous notes and results and patient face to face interaction during today's visit.    FERNAND FREDY RAMAN, MD  06/05/2024   This document may have been prepared by [facility] Voice Recognition software and as such may include unintentional dictation errors.     [1]  Allergies Allergen Reactions   Isosorbide Dinitrate Other (See Comments)    Collapse   Amlodipine Swelling   Sulfa Antibiotics Itching   Tizanidine Hcl    Oxycodone  Itching  [2]  Outpatient Medications Prior to Visit  Medication Sig   albuterol  (VENTOLIN  HFA) 108 (90 Base) MCG/ACT inhaler Inhale 2 puffs into the lungs every 6 (six) hours as needed for wheezing or shortness of breath.   allopurinol  (ZYLOPRIM ) 100 MG tablet TAKE 1 TABLET BY MOUTH EVERY DAY   aspirin  EC 81 MG tablet Take 1 tablet (81 mg total) by mouth daily. Swallow whole.   calcitRIOL (ROCALTROL) 0.25 MCG capsule Take 0.25 mcg by mouth.   citalopram  (CELEXA ) 10 MG tablet TAKE 1 TABLET BY MOUTH EVERY DAY   fluticasone  (FLONASE ) 50 MCG/ACT nasal spray Place 2 sprays into both nostrils daily. SPRAY 2 SPRAYS INTO EACH NOSTRIL EVERY DAY   furosemide  (LASIX ) 40 MG tablet TAKE 1 TABLET BY MOUTH EVERY DAY   gabapentin  (NEURONTIN ) 100 MG capsule Take 1-3 capsules (100-300 mg total) by mouth at bedtime.   glimepiride  (AMARYL ) 2 MG tablet TAKE 1 TABLET (2 MG TOTAL) BY MOUTH SEE ADMIN INSTRUCTIONS. TAKE 1 MG DAILY, MAY INCREASE TO 2 MG IF BLOOD SUGAR IS OVER 175   hydrALAZINE  (APRESOLINE ) 100 MG tablet Take 1 tablet (100 mg total) by mouth 2 (two) times daily.  (Patient taking differently: Take 100 mg by mouth daily.)   hydroxychloroquine  (PLAQUENIL ) 200 MG tablet Take 200 mg by mouth daily.   labetalol  (NORMODYNE ) 300 MG tablet TAKE 1 TABLET BY MOUTH TWICE A DAY   losartan  (COZAAR ) 100 MG tablet TAKE 1 TABLET BY MOUTH EVERY DAY IN THE MORNING   nitroGLYCERIN  (NITROSTAT ) 0.6 MG SL tablet TAKE 1 TABLET BY MOUTH AS NEEDED FOR CP. WAIT 5 MINUTES BEFORE NEXT DOSE. PROCEED TO ER IF NO RELIEF AFTER 3 DOSES  ondansetron  (ZOFRAN ) 4 MG tablet Take 1 tablet (4 mg total) by mouth every 8 (eight) hours as needed for nausea or vomiting.   rosuvastatin  (CRESTOR ) 40 MG tablet TAKE 1 TABLET BY MOUTH EVERY DAY   SYMBICORT 80-4.5 MCG/ACT inhaler INHALE 1 PUFF BY MOUTH TWICE A DAY   traMADol  (ULTRAM ) 50 MG tablet Take 1 tablet (50 mg total) by mouth every 6 (six) hours as needed for moderate pain (pain score 4-6).   tretinoin (RETIN-A) 0.05 % cream APPLY TO AFFECTED AREA EVERY DAY AT BEDTIME   zolpidem  (AMBIEN ) 5 MG tablet Take 1 tablet (5 mg total) by mouth at bedtime as needed. for sleep   [DISCONTINUED] levocetirizine (XYZAL ) 5 MG tablet TAKE 1 TABLET BY MOUTH EVERY DAY IN THE EVENING (Patient taking differently: Take 5 mg by mouth as needed for allergies.)   carvedilol (COREG) 25 MG tablet Take 25 mg by mouth 2 (two) times daily. (Patient not taking: Reported on 06/05/2024)   enalapril (VASOTEC) 5 MG tablet Take 5 mg by mouth daily. (Patient not taking: Reported on 06/05/2024)   ezetimibe  (ZETIA ) 10 MG tablet Take 1 tablet (10 mg total) by mouth daily. (Patient not taking: Reported on 06/05/2024)   [DISCONTINUED] allopurinol  (ZYLOPRIM ) 300 MG tablet Take 300 mg by mouth daily. (Patient not taking: Reported on 06/05/2024)   [DISCONTINUED] ciprofloxacin  (CIPRO ) 500 MG tablet Take 500 mg by mouth 2 (two) times daily. (Patient not taking: Reported on 06/05/2024)   [DISCONTINUED] clopidogrel  (PLAVIX ) 75 MG tablet Take 75 mg by mouth daily. (Patient not taking: Reported on  06/05/2024)   [DISCONTINUED] clotrimazole -betamethasone (LOTRISONE) cream APPLY TWICE A DAY TO GROIN AREA SKIN FOR 1 WEEK AND THEN AS NEEDED. (Patient not taking: Reported on 06/05/2024)   [DISCONTINUED] Cyanocobalamin (B-12 PO) Take by mouth. (Patient not taking: Reported on 06/05/2024)   [DISCONTINUED] methocarbamol  (ROBAXIN ) 500 MG tablet TAKE 1 TABLET BY MOUTH EVERY 6 HOURS AS NEEDED FOR MUSCLE SPASMS. (Patient not taking: Reported on 06/05/2024)   [DISCONTINUED] pantoprazole (PROTONIX) 40 MG tablet Take 40 mg by mouth. (Patient not taking: Reported on 06/05/2024)   [DISCONTINUED] pantoprazole (PROTONIX) 40 MG tablet TAKE 1 TABLET BY MOUTH EVERY DAY (Patient not taking: Reported on 06/05/2024)   [DISCONTINUED] senna (SENOKOT) 8.6 MG TABS tablet Take 1 tablet (8.6 mg total) by mouth daily as needed for mild constipation. (Patient not taking: Reported on 06/05/2024)   No facility-administered medications prior to visit.

## 2024-06-06 ENCOUNTER — Other Ambulatory Visit

## 2024-06-06 NOTE — Patient Outreach (Signed)
 Complex Care Management   Visit Note  06/06/2024  Name:  [name] MRN: [id number] DOB: [birth year]  Situation: Referral received for Complex Care Management related to Diabetes with Complications I obtained verbal consent from Patient.  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 b.)07/18/2006 -endovas oblit complex ACOM aneur. c.) Endovas Tx of 6.8x45mm RMCA aneur. d.) 3.5x80mm remnant RMCA aneur 2/2 coil compaction. e.) Interval 3.2x3.55mm saccular outpouching in ACOM c/w mild recannulization in neck. 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 g.) 3.7x60mm remnant of prev Tx'd ACOM aneur -failed embol 02/24/2012.   CKD (chronic kidney disease), stage IV (HCC)    a.) solitary functioning kidney on the RIGHT   Complication of anesthesia    a.) delayed emergence   Coronary artery disease 02/23/2007   a.) LHC 02/23/2007 --> EF 50%; 30% pLAD, 70% mRCA --> planned for staged PCI. b.) PCI 02/27/2007: EF 60%; 3.5 x 15 mm Vision BMS to 80% mRCA. b.) LHC 07/24/2007: EF 60%; minor irregs; no occlusive CAD; no intervention. c.) LHC 10/28/2010: EF 60%; 30% mLAD, LCx with minor luminal irregs, 20% ISR mRCA; no intervention. d.) LHC 05/31/2013: EF 60%; 40% mLAD, 30% ISR m-dRCA; no interventions.   DDD (degenerative disc disease), cervical    a.) s/p ACDF C5-C7; hardware in neck; patient appreciates stiffness and issues with mobility   Diastolic dysfunction    a.)  TTE 07/07/2021: EF 87.8%; normal PASP; trace TR/MR; G1DD.   Elevated LEFT hemidiaphragm    Gout    Headache(784.0)    Heart murmur    HLD (hyperlipidemia)    HOH (hard of hearing)    Has hearing aids, doesn't wear   Hyperkalemia    Hyperparathyroidism due to renal insufficiency    Hypertension    Incomplete right bundle branch block  (RBBB)    Insomnia    a.) takes zolpidem    Long term (current) use of immunomodulator    a.) on DMARD therapy (hydroxychloroquine ) for RA/SLE Dx.   Lumbar adjacent segment disease with spondylolisthesis    Multiple acquired cysts of kidney    On chronic clopidogrel  therapy    OSA (obstructive sleep apnea)    a.) does NOT use nocturnal pap therapy   Osteoarthritis    Pancreatic cyst    Peripheral vascular disease    Pneumonia    Post-COVID chronic cough    Pulmonary nodule    RBBB (right bundle branch block)    Rectal bleeding    Rheumatoid arthritis (HCC)    a.) on DMARD; hydroxychloriquine   Shortness of breath 03/01/2023   Systemic lupus erythematosus (HCC)    T2DM (type 2 diabetes mellitus) (HCC)    Tendinitis of left wrist    Thickened endometrium    Thickened endometrium    Wears dentures    partial lower    Assessment: Patient Reported Symptoms:  Cognitive Cognitive Status: No symptoms reported Cognitive/Intellectual Conditions Management [RPT]: None reported or documented in medical history or problem list   Health Maintenance Behaviors: Annual physical exam Healing Pattern: Average Health Facilitated by: Rest  Neurological Neurological Review of Symptoms: No symptoms reported Neurological Management Strategies: Coping strategies, Routine screening Neurological Self-Management Outcome: 4 (good) Neurological Comment: Lupus  HEENT HEENT Symptoms Reported: No symptoms reported HEENT Management Strategies:  Routine screening, Coping strategies HEENT Self-Management Outcome: 4 (good) HEENT Comment: Hearing Aids  still not wearing them ego / has dentures and it hurts gums when [facility]    Cardiovascular Cardiovascular Symptoms Reported: Swelling in legs or feet Other Cardiovascular Symptoms: bilateral swelling Does patient have uncontrolled Hypertension?: No Is patient checking Blood Pressure at home?: Yes Patient's Recent BP reading at home: couldnt remember  reading and stated that it win't turn on now. Cardiovascular Management Strategies: Adequate rest, Routine screening Weight: 180 lb (81.6 kg) Cardiovascular Self-Management Outcome: 4 (good)  Respiratory Respiratory Symptoms Reported: No symptoms reported Respiratory Management Strategies: Adequate rest, Routine screening Respiratory Self-Management Outcome: 4 (good)  Endocrine Endocrine Symptoms Reported: No symptoms reported Is patient diabetic?: Yes Is patient checking blood sugars at home?: Yes List most recent blood sugar readings, include date and time of day: 69 12/16 PM before bed Endocrine Self-Management Outcome: 3 (uncertain)  Gastrointestinal Gastrointestinal Symptoms Reported: Constipation Additional Gastrointestinal Details: occassional Gastrointestinal Management Strategies: Adequate rest, Diet modification Gastrointestinal Self-Management Outcome: 4 (good)    Genitourinary Genitourinary Symptoms Reported: No symptoms reported Additional Genitourinary Details: CLOUDY  Culture done at Dr office yesterday Genitourinary Management Strategies: Adequate rest Genitourinary Self-Management Outcome: 4 (good)  Integumentary Integumentary Symptoms Reported: No symptoms reported Skin Management Strategies: Adequate rest, Coping strategies, Routine screening Skin Self-Management Outcome: 4 (good)  Musculoskeletal Musculoskelatal Symptoms Reviewed: Back pain Additional Musculoskeletal Details: X Ray on Back at Dr Lawerance 06/05/2024 Musculoskeletal Management Strategies: Adequate rest Musculoskeletal Self-Management Outcome: 4 (good) Falls in the past year?: No Number of falls in past year: 1 or less Was there an injury with Fall?: No Fall Risk Category Calculator: 0 Patient Fall Risk Level: Low Fall Risk Patient at Risk for Falls Due to: Impaired balance/gait, Impaired mobility Fall risk Follow up: Falls evaluation completed  Psychosocial Psychosocial Symptoms Reported: No  symptoms reported Behavioral Management Strategies: Adequate rest, Coping strategies, Support system Behavioral Health Self-Management Outcome: 4 (good) Major Change/Loss/Stressor/Fears (CP): Denies Techniques to Cope with Loss/Stress/Change: Diversional activities, Spiritual practice(s) Quality of Family Relationships: helpful, involved, supportive Do you feel physically threatened by others?: No    06/06/2024    PHQ2-9 Depression Screening   Little interest or pleasure in doing things Not at all  Feeling down, depressed, or hopeless Several days  PHQ-2 - Total Score 1  Trouble falling or staying asleep, or sleeping too much    Feeling tired or having little energy    Poor appetite or overeating     Feeling bad about yourself - or that you are a failure or have let yourself or your family down    Trouble concentrating on things, such as reading the newspaper or watching television    Moving or speaking so slowly that other people could have noticed.  Or the opposite - being so fidgety or restless that you have been moving around a lot more than usual    Thoughts that you would be better off dead, or hurting yourself in some way    PHQ2-9 Total Score    If you checked off any problems, how difficult have these problems made it for you to do your work, take care of things at home, or get along with other people    Depression Interventions/Treatment      Today's Vitals   06/06/24 1410  Weight: 180 lb (81.6 kg)  Height: 5' 3 (1.6 m)   Pain Scale: 0-10 Pain Score: 7  Pain Type: Chronic pain Pain Location: Back Pain  Orientation: Right Pain Descriptors / Indicators: Other (Comment) (Just Pain 24 hours a day) Pain Onset: On-going Patients Stated Pain Goal: 0 Pain Intervention(s): Medication (See eMAR), Hot/Cold interventions Multiple Pain Sites: No  Medications Reviewed Today     Reviewed by Kay Hendricks MATSU, RN (Case Manager) on 06/06/24 at 1404  Med List Status: <None>    Medication Order Taking? Sig Documenting Provider Last Dose Status Informant  albuterol  (VENTOLIN  HFA) 108 (90 Base) MCG/ACT inhaler [id number] Yes Inhale 2 puffs into the lungs every 6 (six) hours as needed for wheezing or shortness of breath. Scoggins, Hospital Doctor, NP  Active   allopurinol  (ZYLOPRIM ) 100 MG tablet [id number] Yes TAKE 1 TABLET BY MOUTH EVERY DAY Fernand Fredy RAMAN, MD  Active   aspirin  EC 81 MG tablet [id number] Yes Take 1 tablet (81 mg total) by mouth daily. Swallow whole. Darron Deatrice LABOR, MD  Active   calcitRIOL (ROCALTROL) 0.25 MCG capsule [id number] Yes Take 0.25 mcg by mouth. [provider]  Active   carvedilol (COREG) 25 MG tablet [id number]  Take 25 mg by mouth 2 (two) times daily.  Patient not taking: Reported on 06/06/2024   [provider]  Consider Medication Status and Discontinue   citalopram  (CELEXA ) 10 MG tablet [id number] Yes TAKE 1 TABLET BY MOUTH EVERY DAY Fernand Fredy RAMAN, MD  Active   enalapril (VASOTEC) 5 MG tablet [id number]  Take 5 mg by mouth daily.  Patient not taking: Reported on 06/06/2024   [provider]  Active   ezetimibe  (ZETIA ) 10 MG tablet [id number]  Take 1 tablet (10 mg total) by mouth daily.  Patient not taking: Reported on 06/06/2024   Darron Deatrice LABOR, MD  Active   fluticasone  (FLONASE ) 50 MCG/ACT nasal spray [id number] Yes Place 2 sprays into both nostrils daily. SPRAY 2 SPRAYS INTO EACH NOSTRIL EVERY DAY Fernand Fredy RAMAN, MD  Active   furosemide  (LASIX ) 40 MG tablet [id number] Yes TAKE 1 TABLET BY MOUTH EVERY DAY Fernand Fredy RAMAN, MD  Active   gabapentin  (NEURONTIN ) 100 MG capsule [id number] Yes Take 1-3 capsules (100-300 mg total) by mouth at bedtime. Marcelino Nurse, MD  Active   glimepiride  (AMARYL ) 2 MG tablet [id number] Yes TAKE 1 TABLET (2 MG TOTAL) BY MOUTH SEE ADMIN INSTRUCTIONS. TAKE 1 MG DAILY, MAY INCREASE TO 2 MG IF BLOOD SUGAR IS OVER 175 Fernand Fredy RAMAN, MD  Active   hydrALAZINE  (APRESOLINE ) 100 MG tablet [id number] Yes  Take 1 tablet (100 mg total) by mouth 2 (two) times daily.  Patient taking differently: Take 100 mg by mouth daily.   Fernand Fredy RAMAN, MD  Active   hydroxychloroquine  (PLAQUENIL ) 200 MG tablet [id number] Yes Take 200 mg by mouth daily. [provider]  Active Self           Med Note BONNITA, ALINE HERO   Fri Aug 20, 2016  7:20 AM)    labetalol  (NORMODYNE ) 300 MG tablet [id number] Yes TAKE 1 TABLET BY MOUTH TWICE A DAY Fernand Fredy RAMAN, MD  Active   losartan  (COZAAR ) 100 MG tablet [id number] Yes TAKE 1 TABLET BY MOUTH EVERY DAY IN THE MORNING Fernand Fredy RAMAN, MD  Active   nitroGLYCERIN  (NITROSTAT ) 0.6 MG SL tablet [id number] Yes TAKE 1 TABLET BY MOUTH AS NEEDED FOR CP. WAIT 5 MINUTES BEFORE NEXT DOSE. PROCEED TO ER IF NO RELIEF AFTER 3 DOSES Fernand Fredy RAMAN, MD  Active   ondansetron  (ZOFRAN ) 4 MG tablet [id number] Yes Take 1 tablet (4 mg total) by  mouth every 8 (eight) hours as needed for nausea or vomiting. Fernand Fredy RAMAN, MD  Active   rosuvastatin  (CRESTOR ) 40 MG tablet [id number] Yes TAKE 1 TABLET BY MOUTH EVERY DAY Fernand Fredy RAMAN, MD  Active   SYMBICORT 80-4.5 MCG/ACT inhaler [id number] Yes INHALE 1 PUFF BY MOUTH TWICE A DAY Fernand Fredy RAMAN, MD  Active   traMADol  (ULTRAM ) 50 MG tablet [id number] Yes Take 1 tablet (50 mg total) by mouth every 6 (six) hours as needed for moderate pain (pain score 4-6). Clois Fret, MD  Active   tretinoin (RETIN-A) 0.05 % cream [id number] Yes APPLY TO AFFECTED AREA EVERY DAY AT BEDTIME Scoggins, Amber, NP  Active   zolpidem  (AMBIEN ) 5 MG tablet [id number] Yes Take 1 tablet (5 mg total) by mouth at bedtime as needed. for sleep Fernand Fredy RAMAN, MD  Active   Med List Note Wendelyn Devere PEAK 02/14/24 9057): 08-04-23 UDS MR 03/15/24            Recommendation:   Continue Current Plan of Care  Follow Up Plan:   Telephone follow-up in 1 month  Hendricks Her RN, BSN  [place] [facility]-Population Health RN Case Manager   Direct [phone]

## 2024-06-06 NOTE — Patient Instructions (Signed)
 Visit Information  [name] was given information about Medicaid Managed Care team care coordination services as a part of their [facility] Medicaid benefit.   If you would like to schedule transportation through your [facility], please call the following number at least 2 days in advance of your appointment: [phone]   Rides for urgent appointments can also be made after hours by calling Member Services.  Call the Behavioral Health Crisis Line at [phone], at any time, 24 hours a day, 7 days a week. If you are in danger or need immediate medical attention call 911.  Please see education materials related to Diabetes provided by MyChart link.  Care plan and visit instructions communicated with the patient verbally today. Patient agrees to receive a copy in MyChart. Active MyChart status and patient understanding of how to access instructions and care plan via MyChart confirmed with patient.     Telephone follow up appointment with Managed Medicaid care management team member scheduled for: 07-09-2024 at 2:30 PM   Hendricks Her RN, BSN  [place] [facility]BC[facility]-Population Health RN Case Manager   Direct [phone]   Following is a copy of your plan of care:   Goals Addressed             This Visit's Progress    VBCI RN Care Plan   On track    Problems:  Chronic Disease Management support and education needs related to DMII  Goal: Over the next 90 days the Patient will attend all scheduled medical appointments: with providers as evidenced by appointment encounter notes in EMR         continue to work with RN Care Manager and/or Social Worker to address care management and care coordination needs related to DMII as evidenced by adherence to care management team scheduled appointments     demonstrate a decrease DMII in exacerbations as evidenced by the next A1C is lower than the 5.9 result taken in 02-2024   demonstrate  understanding of rationale for each prescribed medication as evidenced by verbalizing in her own words the importance of each medication      not experience hospital admission as evidenced by review of electronic medical record. Hospital Admissions in last 6 months = 0 UPDATE 05-07-2024 No hospital admission since last CCM assessment  take all medications exactly as prescribed and will call provider for medication related questions as evidenced by communication with Provider for any medication questions or concerns     verbalize understanding of plan for management of DMII as evidenced by explaining in her own words that she will take her FSBS daily , watch carbohydrate intake and increase water  intake  UPDATE:05-07-2024 Patient states she still struggles with water  intake but will continue to try to increase   UPDATE  Patients states she is drinking 4-5 glasses of water  per day  Interventions:   Diabetes Interventions: Assessed patient's understanding of A1c goal: <6.5% Provided education to patient about basic DM disease process Reviewed medications with patient and discussed importance of medication adherence Discussed plans with patient for ongoing care management follow up and provided patient with direct contact information for care management team Provided patient with written educational materials related to hypo and hyperglycemia and importance of correct treatment Reviewed scheduled/upcoming provider appointments including:  06-08-2024 at 43:45 Fredy Bathe 06-19-2024 1:00 PM Neelam Bathe 06-26-2024  2:40 PM  [facility]  06-29-2024 3 :35 PM [facility] 08-02-2024 1:00 PM Dr Bathe Certain  patient, providing education and rationale, to check cbg daily  and record, calling provider for findings outside established parameters Review of patient status, including review of consultants reports, relevant laboratory and other test results, and medications completed Screening for signs and symptoms of  depression related to chronic disease state  Assessed social determinant of health barriers Lab Results  Component Value Date   HGBA1C 5.9 (H) 03/08/2024   UPDATE 06-06-2024 NEW A1C results pending from 06-05-2024 Patient Self-Care Activities:  Attend all scheduled provider appointments Call pharmacy for medication refills 3-7 days in advance of running out of medications Call provider office for new concerns or questions  Notify RN Care Manager of TOC call rescheduling needs Take medications as prescribed   check blood sugar at prescribed times: once daily check feet daily for cuts, sores or redness take the blood sugar log to all doctor visits drink 6 to 8 glasses of water  each day eat fish at least once per week fill half of plate with vegetables set a realistic goal keep feet up while sitting  Plan:  Telephone follow up appointment with care management team member scheduled for:  07-09-2024 at 2:30 PM            VBCI RN Care Plan -Patient Stated:  I want to manage my arthritis pain and health conditions   On track    Problems:  Chronic Disease Management support and education needs related to pain management  Goal: Revised goal -Over the next 6 months the Patient will demonstrate ongoing self health care management ability to manage her pain and other major medical conditions as evidenced by    EMR documentation by providers & verbalization of decreased pain level values  Update 03/09/24 still with pain but at a level 3 as she has been in bed and her daughter is at the home to care for her today  Update 04-09-2024  Patient states pain is being managed better  UPDATE 05-07-2024 Patient states pain in L hip is better after injection but R hip hasn't improved. Next follow up in January per patient   UPDATE 06-06-2024  Patient had recent back xrays and results are pending   Interventions:    Pain Interventions: Assessed patients pain, discussed dietary changes, discussed  care from her daughter  RN CM provided encouragement Answered questions Discussed how managing other medical issues would also help decrease her pain Discussed elevated BPs related to pain Updated her on new RN CM  Patient Self-Care Activities:  Attend all scheduled provider appointments Call pharmacy for medication refills 3-7 days in advance of running out of medications Call provider office for new concerns or questions  Take medications as prescribed    Plan:  Telephone follow up appointment with care management team member scheduled for:  with Hendricks Her on 06-06-2024 at 1:00 PM  The patient has been provided with contact information for the care management team and has been advised to call with any health related questions or concerns.  L Jesscia Imm [phone]

## 2024-06-08 ENCOUNTER — Encounter: Payer: Self-pay | Admitting: Internal Medicine

## 2024-06-08 ENCOUNTER — Ambulatory Visit: Admitting: Internal Medicine

## 2024-06-08 ENCOUNTER — Ambulatory Visit: Payer: Self-pay | Admitting: Internal Medicine

## 2024-06-08 VITALS — BP 156/84 | HR 78 | Ht 63.0 in | Wt 183.0 lb

## 2024-06-08 DIAGNOSIS — M158 Other polyosteoarthritis: Secondary | ICD-10-CM

## 2024-06-08 DIAGNOSIS — G894 Chronic pain syndrome: Secondary | ICD-10-CM | POA: Diagnosis not present

## 2024-06-08 DIAGNOSIS — N390 Urinary tract infection, site not specified: Secondary | ICD-10-CM

## 2024-06-08 DIAGNOSIS — E782 Mixed hyperlipidemia: Secondary | ICD-10-CM | POA: Diagnosis not present

## 2024-06-08 DIAGNOSIS — N185 Chronic kidney disease, stage 5: Secondary | ICD-10-CM | POA: Diagnosis not present

## 2024-06-08 DIAGNOSIS — E1165 Type 2 diabetes mellitus with hyperglycemia: Secondary | ICD-10-CM

## 2024-06-08 DIAGNOSIS — A499 Bacterial infection, unspecified: Secondary | ICD-10-CM | POA: Insufficient documentation

## 2024-06-08 DIAGNOSIS — I152 Hypertension secondary to endocrine disorders: Secondary | ICD-10-CM | POA: Diagnosis not present

## 2024-06-08 DIAGNOSIS — E1159 Type 2 diabetes mellitus with other circulatory complications: Secondary | ICD-10-CM | POA: Diagnosis not present

## 2024-06-08 DIAGNOSIS — E1169 Type 2 diabetes mellitus with other specified complication: Secondary | ICD-10-CM

## 2024-06-08 LAB — URINE CULTURE

## 2024-06-08 LAB — POCT CBG (FASTING - GLUCOSE)-MANUAL ENTRY: Glucose Fasting, POC: 98 mg/dL (ref 70–99)

## 2024-06-08 MED ORDER — CEFPODOXIME PROXETIL 100 MG PO TABS: 100.0000 mg | ORAL_TABLET | Freq: Every day | ORAL | 0 refills | Status: AC

## 2024-06-08 MED ORDER — LIDOCAINE 5 % EX PTCH: MEDICATED_PATCH | CUTANEOUS | 4 refills | Status: DC

## 2024-06-08 NOTE — Progress Notes (Signed)
 "  Established Patient Office Visit  Subjective:  Patient ID: [name], female    DOB: 07-06-1945  Age: 78 y.o. MRN: [id number]  Chief Complaint  Patient presents with   Follow-up    3 day follow up    Patient is here today for follow up. She reports she has concerns to discuss today.  She is due for fasting blood work. She reports she is not fasting today and will return to get blood work completed. Blood work was ordered after her last appointment. She reports BP is elevated today and she took her medications as prescribed. Patient reports she will return Monday fasting to get her blood work completed.  She reports pain left side neck and it is worse when she turns her head to the left. She has chronic pain and is established patient with pain specialist. Will start lidocaine  patch but advised patient to contact her pain specialist to be seen sooner than her scheduled appointment 07/01/24. She states she is taking tylenol  arthritis instead of her Tramadol  that is prescribed as needed. She reports taking her other long term pain medications without symptom relief.  Patient has continued right sided flank pain. Her UA was sent for culture and is pending through [facility] but indicates E. Coli infection. Will start once daily cefpodoxime  100 mg tablet for 7 days.    No other concerns at this time.   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 b.)07/18/2006 -endovas oblit complex ACOM aneur. c.) Endovas Tx of 6.8x46mm RMCA aneur. d.) 3.5x3mm remnant RMCA aneur 2/2 coil compaction. e.) Interval 3.2x3.10mm saccular outpouching in ACOM c/w mild recannulization in neck. 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 g.) 3.7x28mm remnant of prev Tx'd ACOM aneur -failed embol 02/24/2012.   CKD (chronic kidney disease), stage IV (HCC)    a.) solitary  functioning kidney on the RIGHT   Complication of anesthesia    a.) delayed emergence   Coronary artery disease 02/23/2007   a.) LHC 02/23/2007 --> EF 50%; 30% pLAD, 70% mRCA --> planned for staged PCI. b.) PCI 02/27/2007: EF 60%; 3.5 x 15 mm Vision BMS to 80% mRCA. b.) LHC 07/24/2007: EF 60%; minor irregs; no occlusive CAD; no intervention. c.) LHC 10/28/2010: EF 60%; 30% mLAD, LCx with minor luminal irregs, 20% ISR mRCA; no intervention. d.) LHC 05/31/2013: EF 60%; 40% mLAD, 30% ISR m-dRCA; no interventions.   DDD (degenerative disc disease), cervical    a.) s/p ACDF C5-C7; hardware in neck; patient appreciates stiffness and issues with mobility   Diastolic dysfunction    a.)  TTE 07/07/2021: EF 87.8%; normal PASP; trace TR/MR; G1DD.   Elevated LEFT hemidiaphragm    Gout    Headache(784.0)    Heart murmur    HLD (hyperlipidemia)    HOH (hard of hearing)    Has hearing aids, doesn't wear   Hyperkalemia    Hyperparathyroidism due to renal insufficiency    Hypertension    Incomplete right bundle branch block (RBBB)    Insomnia    a.) takes zolpidem    Long term (current) use of immunomodulator    a.) on DMARD therapy (hydroxychloroquine ) for RA/SLE Dx.   Lumbar adjacent segment disease with spondylolisthesis    Multiple acquired cysts of kidney    On chronic clopidogrel  therapy    OSA (obstructive sleep  apnea)    a.) does NOT use nocturnal pap therapy   Osteoarthritis    Pancreatic cyst    Peripheral vascular disease    Pneumonia    Post-COVID chronic cough    Pulmonary nodule    RBBB (right bundle branch block)    Rectal bleeding    Rheumatoid arthritis (HCC)    a.) on DMARD; hydroxychloriquine   Shortness of breath 03/01/2023   Systemic lupus erythematosus (HCC)    T2DM (type 2 diabetes mellitus) (HCC)    Tendinitis of left wrist    Thickened endometrium    Thickened endometrium    Wears dentures    partial lower    Past Surgical History:  Procedure Laterality Date    ANEURYSM COILING  08/2013   ANTERIOR LATERAL LUMBAR FUSION WITH PERCUTANEOUS SCREW 1 LEVEL N/A 03/09/2023   Procedure: L3-4 LATERAL LUMBAR INTERBODY FUSION;  Surgeon: Clois Fret, MD;  Location: [facility];  Service: Neurosurgery;  Laterality: N/A;   APPLICATION OF INTRAOPERATIVE CT SCAN N/A 03/09/2023   Procedure: APPLICATION OF INTRAOPERATIVE CT SCAN;  Surgeon: Clois Fret, MD;  Location: [facility];  Service: Neurosurgery;  Laterality: N/A;   Attempted embolization of previously treated ACOM aneurysm; unsuccessful N/A 01/28/2012   Location: [facility]   BACK SURGERY     BARTHOLIN CYST MARSUPIALIZATION     BICEPT TENODESIS Right 03/02/2022   Procedure: BICEPS TENODESIS;  Surgeon: Edie Norleen PARAS, MD;  Location: [facility];  Service: Orthopedics;  Laterality: Right;   BREAST CYST ASPIRATION Right 01/25/2012   FNA neg.   BREAST EXCISIONAL BIOPSY Left 06/26/2007   neg   BUNIONECTOMY Bilateral 2022   CARDIAC CATHETERIZATION Left 02/23/2007   Procedure: CARDIAC CATHETERIZATION; Location: [facility]; Surgeon: Denyse Bathe, MD   CARDIAC CATHETERIZATION Left 07/31/2007   Procedure: CARDIAC CATHETERIZATION; Location: [facility]; Surgeon: Denyse Bathe, MD   CARDIAC CATHETERIZATION Left 10/28/2010   Procedure: CARDIAC CATHETERIZATION; Location: [facility]; Surgeon: Denyse Bathe, MD   CARDIAC CATHETERIZATION Left 05/31/2013   Procedure: CARDIAC CATHETERIZATION; Location: [facility]; Surgeon: Denyse Bathe, MD   Carotid arteriogram; 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 N/A 07/18/2006   Location: [facility]   CATARACT EXTRACTION     CERVICAL FUSION N/A    Procedure: ACDF C5-C7   COLONOSCOPY WITH PROPOFOL  N/A 10/04/2019   Procedure: COLONOSCOPY WITH BIOPSY;  Surgeon: Unk Corinn Skiff, MD;  Location: [facility];  Service: Endoscopy;  Laterality: N/A;  Diabetic (borderline) - oral meds priority 3   CORONARY STENT INTERVENTION Left 02/27/2007    Procedure: CORONARY STENT INTERVENTION (3.5 x 15 mm Vision BMS to mRCA); Location: [facility]; Surgeon: Deatrice Cage, MD   Endovascular near complete obliteration of enlarging neck remnant of previously treated RIGHT MCA aneurysm using stent assisted coiling N/A 09/03/2010   Location: [facility]   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 N/A 10/03/2006   Location: [facility]   ESOPHAGOGASTRODUODENOSCOPY N/A 04/06/2024   Procedure: EGD (ESOPHAGOGASTRODUODENOSCOPY);  Surgeon: Rollin Dover, MD;  Location: [facility];  Service: Gastroenterology;  Laterality: N/A;   EUS N/A 04/06/2024   Procedure: ULTRASOUND, UPPER GI TRACT, ENDOSCOPIC;  Surgeon: Rollin Dover, MD;  Location: [facility];  Service: Gastroenterology;  Laterality: N/A;   FINE NEEDLE ASPIRATION  04/06/2024   Procedure: FINE NEEDLE ASPIRATION;  Surgeon: Rollin Dover, MD;  Location: [facility];  Service: Gastroenterology;;   HYSTEROSCOPY WITH D & C N/A 08/04/2021   Procedure: DILATATION AND CURETTAGE /HYSTEROSCOPY;  Surgeon: Victor Claudell SAUNDERS,  MD;  Location: [facility];  Service: Gynecology;  Laterality: N/A;   POLYPECTOMY N/A 10/04/2019   Procedure: POLYPECTOMY;  Surgeon: Unk Corinn Skiff, MD;  Location: [facility];  Service: Endoscopy;  Laterality: N/A;   REVERSE SHOULDER ARTHROPLASTY Right 03/02/2022   Procedure: REVERSE SHOULDER ARTHROPLASTY;  Surgeon: Edie Norleen PARAS, MD;  Location: [facility];  Service: Orthopedics;  Laterality: Right;   UPPER ESOPHAGEAL ENDOSCOPIC ULTRASOUND (EUS) N/A 04/15/2016   Procedure: UPPER ESOPHAGEAL ENDOSCOPIC ULTRASOUND (EUS);  Surgeon: Asberry DELENA Coffee, MD;  Location: [facility];  Service: Gastroenterology;  Laterality: N/A;    Social History   Socioeconomic History   Marital status: Widowed    Spouse name: Not on file   Number of children: Not on file   Years of education: Not on file   Highest education level: Not on file   Occupational History   Not on file  Tobacco Use   Smoking status: Never    Passive exposure: Never   Smokeless tobacco: Never  Vaping Use   Vaping status: Never Used  Substance and Sexual Activity   Alcohol use: Yes    Comment: Occasionally at christmas   Drug use: No   Sexual activity: Not Currently    Birth control/protection: Post-menopausal  Other Topics Concern   Not on file  Social History Narrative   Lives with daughter   Social Drivers of Health   Tobacco Use: Low Risk (06/08/2024)   Patient History    Smoking Tobacco Use: Never    Smokeless Tobacco Use: Never    Passive Exposure: Never  Financial Resource Strain: Low Risk (02/01/2024)   Overall Financial Resource Strain (CARDIA)    Difficulty of Paying Living Expenses: Not hard at all  Food Insecurity: No Food Insecurity (06/06/2024)   Epic    Worried About Radiation Protection Practitioner of Food in the Last Year: Never true    Ran Out of Food in the Last Year: Never true  Transportation Needs: No Transportation Needs (06/06/2024)   Epic    Lack of Transportation (Medical): No    Lack of Transportation (Non-Medical): No  Physical Activity: Insufficiently Active (02/01/2024)   Exercise Vital Sign    Days of Exercise per Week: 2 days    Minutes of Exercise per Session: 30 min  Stress: No Stress Concern Present (10/11/2023)   [employer] of Occupational Health - Occupational Stress Questionnaire    Feeling of Stress : Not at all  Social Connections: Moderately Isolated (10/11/2023)   Social Connection and Isolation Panel    Frequency of Communication with Friends and Family: More than three times a week    Frequency of Social Gatherings with Friends and Family: More than three times a week    Attends Religious Services: Never    Database Administrator or Organizations: Yes    Attends Banker Meetings: Never    Marital Status: Widowed  Intimate Partner Violence: Not At Risk (06/06/2024)   Epic    Fear of  Current or Ex-Partner: No    Emotionally Abused: No    Physically Abused: No    Sexually Abused: No  Depression (PHQ2-9): Low Risk (06/06/2024)   Depression (PHQ2-9)    PHQ-2 Score: 1  Alcohol Screen: Low Risk (05/31/2023)   Alcohol Screen    Last Alcohol Screening Score (AUDIT): 0  Housing: Low Risk (06/06/2024)   Epic    Unable to Pay for Housing in the Last Year: No    Number of Times Moved  in the Last Year: 0    Homeless in the Last Year: No  Utilities: Not At Risk (06/06/2024)   Epic    Threatened with loss of utilities: No  Health Literacy: Adequate Health Literacy (02/01/2024)   B1300 Health Literacy    Frequency of need for help with medical instructions: Never    Family History  Problem Relation Age of Onset   Breast cancer Paternal Aunt 54   Breast cancer Paternal Aunt    Heart disease Mother    Heart disease Father    Stroke Father    Diabetes Sister    Diabetes Brother     Allergies[1]  Show/hide medication list[2]  Review of Systems  Constitutional: Negative.  Negative for chills, fever and malaise/fatigue.  HENT: Negative.  Negative for congestion and sore throat.   Eyes: Negative.  Negative for blurred vision and pain.  Respiratory: Negative.  Negative for cough and shortness of breath.   Cardiovascular: Negative.  Negative for chest pain, palpitations and leg swelling.  Gastrointestinal: Negative.  Negative for abdominal pain, blood in stool, constipation, diarrhea, heartburn, melena, nausea and vomiting.  Genitourinary:  Positive for flank pain (right side). Negative for dysuria, frequency and urgency.  Musculoskeletal:  Positive for back pain and neck pain (left side). Negative for joint pain and myalgias.  Skin: Negative.   Neurological: Negative.  Negative for dizziness, tingling, sensory change, weakness and headaches.  Endo/Heme/Allergies: Negative.   Psychiatric/Behavioral: Negative.  Negative for depression and suicidal ideas. The patient is  not nervous/anxious.        Objective:   BP (!) 156/84   Pulse 78   Ht 5' 3 (1.6 m)   Wt 183 lb (83 kg)   SpO2 97%   BMI 32.42 kg/m   Vitals:   06/08/24 1551  BP: (!) 156/84  Pulse: 78  Height: 5' 3 (1.6 m)  Weight: 183 lb (83 kg)  SpO2: 97%  BMI (Calculated): 32.43    Physical Exam Vitals and nursing note reviewed.  Constitutional:      Appearance: Normal appearance.  HENT:     Head: Normocephalic and atraumatic.     Nose: Nose normal.     Mouth/Throat:     Mouth: Mucous membranes are moist.     Pharynx: Oropharynx is clear.  Eyes:     Conjunctiva/sclera: Conjunctivae normal.     Pupils: Pupils are equal, round, and reactive to light.  Cardiovascular:     Rate and Rhythm: Normal rate and regular rhythm.     Pulses: Normal pulses.     Heart sounds: Normal heart sounds. No murmur heard. Pulmonary:     Effort: Pulmonary effort is normal.     Breath sounds: Normal breath sounds. No wheezing.  Abdominal:     General: Bowel sounds are normal.     Palpations: Abdomen is soft.     Tenderness: There is no abdominal tenderness. There is no right CVA tenderness or left CVA tenderness.  Musculoskeletal:     Cervical back: Muscular tenderness present. Decreased range of motion.     Right lower leg: No edema.     Left lower leg: No edema.  Skin:    General: Skin is warm and dry.  Neurological:     General: No focal deficit present.     Mental Status: She is alert and oriented to person, place, and time.  Psychiatric:        Mood and Affect: Mood normal.  Behavior: Behavior normal.      Results for orders placed or performed in visit on 06/08/24  POCT CBG (Fasting - Glucose)  Result Value Ref Range   Glucose Fasting, POC 98 70 - 99 mg/dL    Recent Results (from the past 2160 hours)  POCT CBG (Fasting - Glucose)     Status: Abnormal   Collection Time: 03/20/24  1:23 PM  Result Value Ref Range   Glucose Fasting, POC 190 (A) 70 - 99 mg/dL  Glucose,  capillary     Status: Abnormal   Collection Time: 04/06/24  9:30 AM  Result Value Ref Range   Glucose-Capillary 124 (H) 70 - 99 mg/dL    Comment: Glucose reference range applies only to samples taken after fasting for at least 8 hours.  POCT CBG (Fasting - Glucose)     Status: Abnormal   Collection Time: 05/01/24  1:54 PM  Result Value Ref Range   Glucose Fasting, POC 126 (A) 70 - 99 mg/dL  POCT CBG (Fasting - Glucose)     Status: Abnormal   Collection Time: 06/05/24  1:34 PM  Result Value Ref Range   Glucose Fasting, POC 122 (A) 70 - 99 mg/dL  POCT Urinalysis Dipstick (18997)     Status: Abnormal   Collection Time: 06/05/24  2:11 PM  Result Value Ref Range   Color, UA Yellow    Clarity, UA Very cloudy    Glucose, UA Negative Negative   Bilirubin, UA Negative    Ketones, UA Negative    Spec Grav, UA 1.025 1.010 - 1.025   Blood, UA Negative    pH, UA 5.0 5.0 - 8.0   Protein, UA Positive (A) Negative   Urobilinogen, UA 0.2 0.2 or 1.0 E.U./dL   Nitrite, UA Positive    Leukocytes, UA Small (1+) (A) Negative   Appearance Very cloudy    Odor Yes   POCT CBG (Fasting - Glucose)     Status: None   Collection Time: 06/08/24  3:56 PM  Result Value Ref Range   Glucose Fasting, POC 98 70 - 99 mg/dL      Assessment & Plan:  Start once daily vantin  for UTI. Start lidocaine  patch prescribed. Patient will return and get fasting blood work completed. Patient to call pain specialist to be seen for worsening pain. Reinforced healthy diet and staying hydrated. Continue other medications as prescribed. Problem List Items Addressed This Visit       Cardiovascular and Mediastinum   Hypertension associated with diabetes (HCC) - Primary     Endocrine   Type 2 diabetes mellitus with hyperglycemia, without long-term current use of insulin  (HCC)   Relevant Orders   POCT CBG (Fasting - Glucose) (Completed)   Combined hyperlipidemia associated with type 2 diabetes mellitus (HCC)      Musculoskeletal and Integument   Osteoarthritis     Genitourinary   CKD (chronic kidney disease) stage 5, GFR less than 15 ml/min (HCC)   Bacterial UTI   Relevant Medications   cefpodoxime  (VANTIN ) 100 MG tablet     Other   Chronic pain syndrome   Relevant Medications   lidocaine  (LIDODERM ) 5 %    Return in about 6 weeks (around 07/20/2024).   Total time spent: 25 minutes. This time includes review of previous notes and results and patient face to face interaction during today's visit.    FERNAND FREDY RAMAN, MD  06/08/2024   This document may have been prepared by [facility] Voice Recognition software  and as such may include unintentional dictation errors.      [1]  Allergies Allergen Reactions   Isosorbide Dinitrate Other (See Comments)    Collapse   Amlodipine Swelling   Sulfa Antibiotics Itching   Tizanidine Hcl    Oxycodone  Itching  [2]  Outpatient Medications Prior to Visit  Medication Sig   albuterol  (VENTOLIN  HFA) 108 (90 Base) MCG/ACT inhaler Inhale 2 puffs into the lungs every 6 (six) hours as needed for wheezing or shortness of breath.   allopurinol  (ZYLOPRIM ) 100 MG tablet TAKE 1 TABLET BY MOUTH EVERY DAY   aspirin  EC 81 MG tablet Take 1 tablet (81 mg total) by mouth daily. Swallow whole.   calcitRIOL (ROCALTROL) 0.25 MCG capsule Take 0.25 mcg by mouth.   citalopram  (CELEXA ) 10 MG tablet TAKE 1 TABLET BY MOUTH EVERY DAY   fluticasone  (FLONASE ) 50 MCG/ACT nasal spray Place 2 sprays into both nostrils daily. SPRAY 2 SPRAYS INTO EACH NOSTRIL EVERY DAY   furosemide  (LASIX ) 40 MG tablet TAKE 1 TABLET BY MOUTH EVERY DAY   gabapentin  (NEURONTIN ) 100 MG capsule Take 1-3 capsules (100-300 mg total) by mouth at bedtime.   glimepiride  (AMARYL ) 2 MG tablet TAKE 1 TABLET (2 MG TOTAL) BY MOUTH SEE ADMIN INSTRUCTIONS. TAKE 1 MG DAILY, MAY INCREASE TO 2 MG IF BLOOD SUGAR IS OVER 175   hydrALAZINE  (APRESOLINE ) 100 MG tablet Take 1 tablet (100 mg total) by mouth 2 (two) times daily.  (Patient taking differently: Take 100 mg by mouth daily.)   hydroxychloroquine  (PLAQUENIL ) 200 MG tablet Take 200 mg by mouth daily.   labetalol  (NORMODYNE ) 300 MG tablet TAKE 1 TABLET BY MOUTH TWICE A DAY   losartan  (COZAAR ) 100 MG tablet TAKE 1 TABLET BY MOUTH EVERY DAY IN THE MORNING   nitroGLYCERIN  (NITROSTAT ) 0.6 MG SL tablet TAKE 1 TABLET BY MOUTH AS NEEDED FOR CP. WAIT 5 MINUTES BEFORE NEXT DOSE. PROCEED TO ER IF NO RELIEF AFTER 3 DOSES   ondansetron  (ZOFRAN ) 4 MG tablet Take 1 tablet (4 mg total) by mouth every 8 (eight) hours as needed for nausea or vomiting.   rosuvastatin  (CRESTOR ) 40 MG tablet TAKE 1 TABLET BY MOUTH EVERY DAY   SYMBICORT 80-4.5 MCG/ACT inhaler INHALE 1 PUFF BY MOUTH TWICE A DAY   traMADol  (ULTRAM ) 50 MG tablet Take 1 tablet (50 mg total) by mouth every 6 (six) hours as needed for moderate pain (pain score 4-6).   tretinoin (RETIN-A) 0.05 % cream APPLY TO AFFECTED AREA EVERY DAY AT BEDTIME   zolpidem  (AMBIEN ) 5 MG tablet Take 1 tablet (5 mg total) by mouth at bedtime as needed. for sleep   carvedilol (COREG) 25 MG tablet Take 25 mg by mouth 2 (two) times daily. (Patient not taking: Reported on 06/08/2024)   enalapril (VASOTEC) 5 MG tablet Take 5 mg by mouth daily. (Patient not taking: Reported on 06/08/2024)   ezetimibe  (ZETIA ) 10 MG tablet Take 1 tablet (10 mg total) by mouth daily. (Patient not taking: Reported on 06/08/2024)   No facility-administered medications prior to visit.   "

## 2024-06-19 ENCOUNTER — Ambulatory Visit: Admitting: Internal Medicine

## 2024-06-25 ENCOUNTER — Encounter: Payer: Self-pay | Admitting: Internal Medicine

## 2024-06-25 ENCOUNTER — Ambulatory Visit
Admission: RE | Admit: 2024-06-25 | Discharge: 2024-06-25 | Disposition: A | Discharge status: Home / Self Care | Source: Ambulatory Visit | Attending: Internal Medicine | Admitting: Internal Medicine

## 2024-06-25 ENCOUNTER — Ambulatory Visit: Admitting: Internal Medicine

## 2024-06-25 ENCOUNTER — Ambulatory Visit
Admission: RE | Admit: 2024-06-25 | Discharge: 2024-06-25 | Disposition: A | Discharge status: Home / Self Care | Attending: Internal Medicine | Admitting: Internal Medicine

## 2024-06-25 VITALS — BP 128/78 | HR 67 | Ht 63.0 in | Wt 179.4 lb

## 2024-06-25 DIAGNOSIS — Z8679 Personal history of other diseases of the circulatory system: Secondary | ICD-10-CM | POA: Diagnosis present

## 2024-06-25 DIAGNOSIS — N185 Chronic kidney disease, stage 5: Secondary | ICD-10-CM

## 2024-06-25 DIAGNOSIS — E1165 Type 2 diabetes mellitus with hyperglycemia: Secondary | ICD-10-CM

## 2024-06-25 DIAGNOSIS — R519 Headache, unspecified: Secondary | ICD-10-CM | POA: Insufficient documentation

## 2024-06-25 DIAGNOSIS — M542 Cervicalgia: Secondary | ICD-10-CM | POA: Diagnosis not present

## 2024-06-25 DIAGNOSIS — I152 Hypertension secondary to endocrine disorders: Secondary | ICD-10-CM | POA: Diagnosis not present

## 2024-06-25 DIAGNOSIS — E1169 Type 2 diabetes mellitus with other specified complication: Secondary | ICD-10-CM | POA: Diagnosis not present

## 2024-06-25 DIAGNOSIS — Z1389 Encounter for screening for other disorder: Secondary | ICD-10-CM | POA: Diagnosis not present

## 2024-06-25 DIAGNOSIS — E782 Mixed hyperlipidemia: Secondary | ICD-10-CM

## 2024-06-25 DIAGNOSIS — E1159 Type 2 diabetes mellitus with other circulatory complications: Secondary | ICD-10-CM

## 2024-06-25 DIAGNOSIS — G894 Chronic pain syndrome: Secondary | ICD-10-CM

## 2024-06-25 DIAGNOSIS — N390 Urinary tract infection, site not specified: Secondary | ICD-10-CM

## 2024-06-25 LAB — POCT URINALYSIS DIPSTICK
Bilirubin, UA: NEGATIVE
Blood, UA: NEGATIVE
Glucose, UA: NEGATIVE
Ketones, UA: NEGATIVE
Nitrite, UA: NEGATIVE
Protein, UA: POSITIVE — AB
Spec Grav, UA: 1.02
Urobilinogen, UA: 0.2 U/dL
pH, UA: 5

## 2024-06-25 LAB — POCT CBG (FASTING - GLUCOSE)-MANUAL ENTRY: Glucose Fasting, POC: 146 mg/dL — AB (ref 70–99)

## 2024-06-25 MED ORDER — LIDOCAINE 5 % EX PTCH: MEDICATED_PATCH | CUTANEOUS | 4 refills | Status: AC

## 2024-06-25 MED ORDER — CARISOPRODOL 250 MG PO TABS: 350.0000 mg | ORAL_TABLET | Freq: Two times a day (BID) | ORAL | 0 refills | Status: AC

## 2024-06-25 NOTE — Progress Notes (Signed)
 "  Established Patient Office Visit  Subjective:  Patient ID: [name], female    DOB: 20-May-1946  Age: 79 y.o. MRN: [id number]  Chief Complaint  Patient presents with   Acute Visit    Pain in head    Patient comes in for her follow-up today and is accompanied by her daughter Lolita. Patient reports of 2 separate episodes where she saw flashing lights, almost like stars followed by her vision becoming blurry and objects appeared wavy-lasting a few minutes.  It happened as she was getting ready to raise her head while getting out of bed.  Today her vision is clear and is not having any further episodes.  However she still needs an eye exam, will get an appointment made today. Patient has completed her antibiotic course for urinary tract infection, will collect another urine specimen for recheck. She continues to have right sided neck pain spreading across the back of her scalp and along to the right side of her head.  She has mild scalp and neck muscle tenderness.  She was prescribed Lidoderm  patches last time but she did not pick them up.  Patient is under care of pain clinic as well.  She has a history of cerebral aneurysm and is concerned.  Will order CT of the head without contrast, and also a C-spine x-ray today. She is due for her blood work which has been ordered previously. Will add a small dose muscle relaxant, Carisoprodol  250 mg, to be used once or twice a day. Her kidney function is being monitored for imminent dialysis.    No other concerns at this time.   Past Medical History:  Diagnosis Date   Anemia of chronic renal disease    Arthritis    Cerebral aneurysm 06/07/2006   a.) 6.2x4.54mm and 4.9x4.58mm ACOM & 7x4.51mm RMCA aneur. b.)07/18/2006 -endovas oblit complex ACOM aneur. c.) Endovas Tx of 6.8x74mm RMCA aneur. d.) 3.5x7mm remnant RMCA aneur 2/2 coil compaction. e.) Interval 3.2x3.32mm saccular outpouching in ACOM c/w mild recannulization in neck. f.) Endovas near  complete oblit of enlarging RMCA. g.) 3.7x20mm remnant of prev Tx'd ACOM aneur -failed embol 02/24/2012.   CKD (chronic kidney disease), stage IV (HCC)    a.) solitary functioning kidney on the RIGHT   Complication of anesthesia    a.) delayed emergence   Coronary artery disease 02/23/2007   a.) LHC 02/23/2007 --> EF 50%; 30% pLAD, 70% mRCA --> planned for staged PCI. b.) PCI 02/27/2007: EF 60%; 3.5 x 15 mm Vision BMS to 80% mRCA. b.) LHC 07/24/2007: EF 60%; minor irregs; no occlusive CAD; no intervention. c.) LHC 10/28/2010: EF 60%; 30% mLAD, LCx with minor luminal irregs, 20% ISR mRCA; no intervention. d.) LHC 05/31/2013: EF 60%; 40% mLAD, 30% ISR m-dRCA; no interventions.   DDD (degenerative disc disease), cervical    a.) s/p ACDF C5-C7; hardware in neck; patient appreciates stiffness and issues with mobility   Diastolic dysfunction    a.)  TTE 07/07/2021: EF 87.8%; normal PASP; trace TR/MR; G1DD.   Elevated LEFT hemidiaphragm    Gout    Headache(784.0)    Heart murmur    HLD (hyperlipidemia)    HOH (hard of hearing)    Has hearing aids, doesn't wear   Hyperkalemia    Hyperparathyroidism due to renal insufficiency    Hypertension    Incomplete right bundle branch block (RBBB)    Insomnia    a.) takes zolpidem    Long term (current) use of immunomodulator  a.) on DMARD therapy (hydroxychloroquine ) for RA/SLE Dx.   Lumbar adjacent segment disease with spondylolisthesis    Multiple acquired cysts of kidney    On chronic clopidogrel  therapy    OSA (obstructive sleep apnea)    a.) does NOT use nocturnal pap therapy   Osteoarthritis    Pancreatic cyst    Peripheral vascular disease    Pneumonia    Post-COVID chronic cough    Pulmonary nodule    RBBB (right bundle branch block)    Rectal bleeding    Rheumatoid arthritis (HCC)    a.) on DMARD; hydroxychloriquine   Shortness of breath 03/01/2023   Systemic lupus erythematosus (HCC)    T2DM (type 2 diabetes mellitus) (HCC)     Tendinitis of left wrist    Thickened endometrium    Thickened endometrium    Wears dentures    partial lower    Past Surgical History:  Procedure Laterality Date   ANEURYSM COILING  08/2013   ANTERIOR LATERAL LUMBAR FUSION WITH PERCUTANEOUS SCREW 1 LEVEL N/A 03/09/2023   Procedure: L3-4 LATERAL LUMBAR INTERBODY FUSION;  Surgeon: Clois Fret, MD;  Location: [facility];  Service: Neurosurgery;  Laterality: N/A;   APPLICATION OF INTRAOPERATIVE CT SCAN N/A 03/09/2023   Procedure: APPLICATION OF INTRAOPERATIVE CT SCAN;  Surgeon: Clois Fret, MD;  Location: [facility];  Service: Neurosurgery;  Laterality: N/A;   Attempted embolization of previously treated ACOM aneurysm; unsuccessful N/A 01/28/2012   Location: [facility]   BACK SURGERY     BARTHOLIN CYST MARSUPIALIZATION     BICEPT TENODESIS Right 03/02/2022   Procedure: BICEPS TENODESIS;  Surgeon: Edie Norleen PARAS, MD;  Location: [facility];  Service: Orthopedics;  Laterality: Right;   BREAST CYST ASPIRATION Right 01/25/2012   FNA neg.   BREAST EXCISIONAL BIOPSY Left 06/26/2007   neg   BUNIONECTOMY Bilateral 2022   CARDIAC CATHETERIZATION Left 02/23/2007   Procedure: CARDIAC CATHETERIZATION; Location: [facility]; Surgeon: Denyse Bathe, MD   CARDIAC CATHETERIZATION Left 07/31/2007   Procedure: CARDIAC CATHETERIZATION; Location: [facility]; Surgeon: Denyse Bathe, MD   CARDIAC CATHETERIZATION Left 10/28/2010   Procedure: CARDIAC CATHETERIZATION; Location: [facility]; Surgeon: Denyse Bathe, MD   CARDIAC CATHETERIZATION Left 05/31/2013   Procedure: CARDIAC CATHETERIZATION; Location: [facility]; Surgeon: Denyse Bathe, MD   Carotid arteriogram; 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 N/A 07/18/2006   Location: [facility]   CATARACT EXTRACTION     CERVICAL FUSION N/A    Procedure: ACDF C5-C7   COLONOSCOPY WITH PROPOFOL  N/A 10/04/2019   Procedure: COLONOSCOPY WITH BIOPSY;  Surgeon: Unk Corinn Skiff, MD;   Location: [facility];  Service: Endoscopy;  Laterality: N/A;  Diabetic (borderline) - oral meds priority 3   CORONARY STENT INTERVENTION Left 02/27/2007   Procedure: CORONARY STENT INTERVENTION (3.5 x 15 mm Vision BMS to mRCA); Location: [facility]; Surgeon: Deatrice Cage, MD   Endovascular near complete obliteration of enlarging neck remnant of previously treated RIGHT MCA aneurysm using stent assisted coiling N/A 09/03/2010   Location: [facility]   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 N/A 10/03/2006   Location: [facility]   ESOPHAGOGASTRODUODENOSCOPY N/A 04/06/2024   Procedure: EGD (ESOPHAGOGASTRODUODENOSCOPY);  Surgeon: Rollin Dover, MD;  Location: [facility];  Service: Gastroenterology;  Laterality: N/A;   EUS N/A 04/06/2024   Procedure: ULTRASOUND, UPPER GI TRACT, ENDOSCOPIC;  Surgeon: Rollin Dover, MD;  Location: [facility];  Service: Gastroenterology;  Laterality: N/A;   FINE NEEDLE ASPIRATION  04/06/2024  Procedure: FINE NEEDLE ASPIRATION;  Surgeon: Rollin Dover, MD;  Location: [facility];  Service: Gastroenterology;;   HYSTEROSCOPY WITH D & C N/A 08/04/2021   Procedure: DILATATION AND CURETTAGE /HYSTEROSCOPY;  Surgeon: Victor Claudell SAUNDERS, MD;  Location: [facility];  Service: Gynecology;  Laterality: N/A;   POLYPECTOMY N/A 10/04/2019   Procedure: POLYPECTOMY;  Surgeon: Unk Corinn Skiff, MD;  Location: [facility];  Service: Endoscopy;  Laterality: N/A;   REVERSE SHOULDER ARTHROPLASTY Right 03/02/2022   Procedure: REVERSE SHOULDER ARTHROPLASTY;  Surgeon: Edie Norleen PARAS, MD;  Location: [facility];  Service: Orthopedics;  Laterality: Right;   UPPER ESOPHAGEAL ENDOSCOPIC ULTRASOUND (EUS) N/A 04/15/2016   Procedure: UPPER ESOPHAGEAL ENDOSCOPIC ULTRASOUND (EUS);  Surgeon: Asberry DELENA Coffee, MD;  Location: [facility];  Service: Gastroenterology;  Laterality: N/A;    Social History   Socioeconomic History    Marital status: Widowed    Spouse name: Not on file   Number of children: Not on file   Years of education: Not on file   Highest education level: Not on file  Occupational History   Not on file  Tobacco Use   Smoking status: Never    Passive exposure: Never   Smokeless tobacco: Never  Vaping Use   Vaping status: Never Used  Substance and Sexual Activity   Alcohol use: Yes    Comment: Occasionally at christmas   Drug use: No   Sexual activity: Not Currently    Birth control/protection: Post-menopausal  Other Topics Concern   Not on file  Social History Narrative   Lives with daughter   Social Drivers of Health   Tobacco Use: Low Risk (06/25/2024)   Patient History    Smoking Tobacco Use: Never    Smokeless Tobacco Use: Never    Passive Exposure: Never  Financial Resource Strain: Low Risk (02/01/2024)   Overall Financial Resource Strain (CARDIA)    Difficulty of Paying Living Expenses: Not hard at all  Food Insecurity: No Food Insecurity (06/06/2024)   Epic    Worried About Radiation Protection Practitioner of Food in the Last Year: Never true    Ran Out of Food in the Last Year: Never true  Transportation Needs: No Transportation Needs (06/06/2024)   Epic    Lack of Transportation (Medical): No    Lack of Transportation (Non-Medical): No  Physical Activity: Insufficiently Active (02/01/2024)   Exercise Vital Sign    Days of Exercise per Week: 2 days    Minutes of Exercise per Session: 30 min  Stress: No Stress Concern Present (10/11/2023)   [employer] of Occupational Health - Occupational Stress Questionnaire    Feeling of Stress : Not at all  Social Connections: Moderately Isolated (10/11/2023)   Social Connection and Isolation Panel    Frequency of Communication with Friends and Family: More than three times a week    Frequency of Social Gatherings with Friends and Family: More than three times a week    Attends Religious Services: Never    Database Administrator or Organizations:  Yes    Attends Banker Meetings: Never    Marital Status: Widowed  Intimate Partner Violence: Not At Risk (06/06/2024)   Epic    Fear of Current or Ex-Partner: No    Emotionally Abused: No    Physically Abused: No    Sexually Abused: No  Depression (PHQ2-9): Low Risk (06/06/2024)   Depression (PHQ2-9)    PHQ-2 Score: 1  Alcohol Screen: Low Risk (05/31/2023)   Alcohol Screen  Last Alcohol Screening Score (AUDIT): 0  Housing: Low Risk (06/06/2024)   Epic    Unable to Pay for Housing in the Last Year: No    Number of Times Moved in the Last Year: 0    Homeless in the Last Year: No  Utilities: Not At Risk (06/06/2024)   Epic    Threatened with loss of utilities: No  Health Literacy: Adequate Health Literacy (02/01/2024)   B1300 Health Literacy    Frequency of need for help with medical instructions: Never    Family History  Problem Relation Age of Onset   Breast cancer Paternal Aunt 12   Breast cancer Paternal Aunt    Heart disease Mother    Heart disease Father    Stroke Father    Diabetes Sister    Diabetes Brother     Allergies[1]  Show/hide medication list[2]  Review of Systems  Constitutional: Negative.  Negative for chills, fever and malaise/fatigue.  HENT: Negative.  Negative for congestion and sore throat.   Eyes: Negative.  Negative for blurred vision and pain.  Respiratory: Negative.  Negative for cough and shortness of breath.   Cardiovascular: Negative.  Negative for chest pain, palpitations and leg swelling.  Gastrointestinal: Negative.  Negative for abdominal pain, blood in stool, constipation, diarrhea, heartburn, melena, nausea and vomiting.  Genitourinary: Negative.  Negative for dysuria, flank pain, frequency and urgency.  Musculoskeletal:  Positive for back pain and neck pain. Negative for joint pain and myalgias.  Skin: Negative.   Neurological:  Positive for headaches. Negative for dizziness, tingling, sensory change and weakness.   Endo/Heme/Allergies: Negative.   Psychiatric/Behavioral: Negative.  Negative for depression and suicidal ideas. The patient is not nervous/anxious.        Objective:   BP 128/78   Pulse 67   Ht 5' 3 (1.6 m)   Wt 179 lb 6.4 oz (81.4 kg)   SpO2 97%   BMI 31.78 kg/m   Vitals:   06/25/24 1058  BP: 128/78  Pulse: 67  Height: 5' 3 (1.6 m)  Weight: 179 lb 6.4 oz (81.4 kg)  SpO2: 97%  BMI (Calculated): 31.79    Physical Exam Vitals and nursing note reviewed.  Constitutional:      Appearance: Normal appearance.  HENT:     Head: Normocephalic and atraumatic.     Nose: Nose normal.     Mouth/Throat:     Mouth: Mucous membranes are moist.     Pharynx: Oropharynx is clear.  Eyes:     Conjunctiva/sclera: Conjunctivae normal.     Pupils: Pupils are equal, round, and reactive to light.  Cardiovascular:     Rate and Rhythm: Normal rate and regular rhythm.     Pulses: Normal pulses.     Heart sounds: Normal heart sounds. No murmur heard. Pulmonary:     Effort: Pulmonary effort is normal.     Breath sounds: Normal breath sounds. No wheezing.  Abdominal:     General: Bowel sounds are normal.     Palpations: Abdomen is soft.     Tenderness: There is no abdominal tenderness. There is no right CVA tenderness or left CVA tenderness.  Musculoskeletal:        General: Normal range of motion.     Cervical back: Normal range of motion.     Right lower leg: No edema.     Left lower leg: No edema.  Skin:    General: Skin is warm and dry.  Neurological:  General: No focal deficit present.     Mental Status: She is alert and oriented to person, place, and time.  Psychiatric:        Mood and Affect: Mood normal.        Behavior: Behavior normal.      Results for orders placed or performed in visit on 06/25/24  POCT CBG (Fasting - Glucose)  Result Value Ref Range   Glucose Fasting, POC 146 (A) 70 - 99 mg/dL  POCT Urinalysis Dipstick (81002)  Result Value Ref Range    Color, UA Yellow    Clarity, UA Slightly cloudy    Glucose, UA Negative Negative   Bilirubin, UA Negative    Ketones, UA Negative    Spec Grav, UA 1.020 1.010 - 1.025   Blood, UA Negative    pH, UA 5.0 5.0 - 8.0   Protein, UA Positive (A) Negative   Urobilinogen, UA 0.2 0.2 or 1.0 E.U./dL   Nitrite, UA Negative    Leukocytes, UA Trace (A) Negative   Appearance Slightly cloudy    Odor Yes     Recent Results (from the past 2160 hours)  Glucose, capillary     Status: Abnormal   Collection Time: 04/06/24  9:30 AM  Result Value Ref Range   Glucose-Capillary 124 (H) 70 - 99 mg/dL    Comment: Glucose reference range applies only to samples taken after fasting for at least 8 hours.  POCT CBG (Fasting - Glucose)     Status: Abnormal   Collection Time: 05/01/24  1:54 PM  Result Value Ref Range   Glucose Fasting, POC 126 (A) 70 - 99 mg/dL  POCT CBG (Fasting - Glucose)     Status: Abnormal   Collection Time: 06/05/24  1:34 PM  Result Value Ref Range   Glucose Fasting, POC 122 (A) 70 - 99 mg/dL  POCT Urinalysis Dipstick (18997)     Status: Abnormal   Collection Time: 06/05/24  2:11 PM  Result Value Ref Range   Color, UA Yellow    Clarity, UA Very cloudy    Glucose, UA Negative Negative   Bilirubin, UA Negative    Ketones, UA Negative    Spec Grav, UA 1.025 1.010 - 1.025   Blood, UA Negative    pH, UA 5.0 5.0 - 8.0   Protein, UA Positive (A) Negative   Urobilinogen, UA 0.2 0.2 or 1.0 E.U./dL   Nitrite, UA Positive    Leukocytes, UA Small (1+) (A) Negative   Appearance Very cloudy    Odor Yes   Urine Culture     Status: Abnormal   Collection Time: 06/05/24  3:49 PM   Specimen: Urine   UR  Result Value Ref Range   Urine Culture, Routine Final report (A)    Organism ID, Bacteria Escherichia coli (A)     Comment: Cefazolin  with an MIC <=16 predicts susceptibility to the oral agents cefaclor, cefdinir , cefpodoxime , cefprozil, cefuroxime, cephalexin , and loracarbef when used for  therapy of uncomplicated urinary tract infections due to E. coli, Klebsiella pneumoniae, and Proteus mirabilis. Multi-Drug Resistant Organism Greater than 100,000 colony forming units per mL    Antimicrobial Susceptibility Comment     Comment:       ** S = Susceptible; I = Intermediate; R = Resistant **                    P = Positive; N = Negative  MICS are expressed in micrograms per mL    Antibiotic                 RSLT#1    RSLT#2    RSLT#3    RSLT#4 Amoxicillin /Clavulanic Acid    I Ampicillin                     R Cefazolin                       S Cefepime                       S Cefoxitin                      S Cefpodoxime                     S Ceftriaxone                    S Ciprofloxacin                   R Ertapenem                      S Gentamicin                     S Levofloxacin                    R Meropenem                      S Nitrofurantoin                 S Piperacillin/Tazobactam        S Tetracycline                   R Tobramycin                     S Trimethoprim/Sulfa             R   POCT CBG (Fasting - Glucose)     Status: None   Collection Time: 06/08/24  3:56 PM  Result Value Ref Range   Glucose Fasting, POC 98 70 - 99 mg/dL  POCT CBG (Fasting - Glucose)     Status: Abnormal   Collection Time: 06/25/24 11:06 AM  Result Value Ref Range   Glucose Fasting, POC 146 (A) 70 - 99 mg/dL  POCT Urinalysis Dipstick (18997)     Status: Abnormal   Collection Time: 06/25/24 11:26 AM  Result Value Ref Range   Color, UA Yellow    Clarity, UA Slightly cloudy    Glucose, UA Negative Negative   Bilirubin, UA Negative    Ketones, UA Negative    Spec Grav, UA 1.020 1.010 - 1.025   Blood, UA Negative    pH, UA 5.0 5.0 - 8.0   Protein, UA Positive (A) Negative   Urobilinogen, UA 0.2 0.2 or 1.0 E.U./dL   Nitrite, UA Negative    Leukocytes, UA Trace (A) Negative   Appearance Slightly cloudy    Odor Yes       Assessment & Plan:  Check labs today,  CT of the head, x-ray C-spine. Continue medications. Diabetic eye exam - Urgent. Problem List Items Addressed This Visit       Cardiovascular and Mediastinum  Hypertension associated with diabetes (HCC)     Endocrine   Type 2 diabetes mellitus with hyperglycemia, without long-term current use of insulin  (HCC) - Primary   Relevant Orders   POCT CBG (Fasting - Glucose) (Completed)   Combined hyperlipidemia associated with type 2 diabetes mellitus (HCC)     Genitourinary   CKD (chronic kidney disease) stage 5, GFR less than 15 ml/min (HCC)     Other   History of cerebral aneurysm   Relevant Orders   CT HEAD WO CONTRAST ( )   Neck pain   Relevant Medications   carisoprodol  (SOMA ) 250 MG tablet   Other Relevant Orders   DG Cervical Spine Complete   Other Visit Diagnoses       Screening for blood or protein in urine       Relevant Orders   POCT Urinalysis Dipstick (18997) (Completed)       Return in about 10 days (around 07/05/2024).   Total time spent: 30 minutes. This time includes review of previous notes and results and patient face to face interaction during today's visit.    FERNAND FREDY RAMAN, MD  06/25/2024   This document may have been prepared by [facility] Voice Recognition software and as such may include unintentional dictation errors.     [1]  Allergies Allergen Reactions   Isosorbide Dinitrate Other (See Comments)    Collapse   Amlodipine Swelling   Sulfa Antibiotics Itching   Tizanidine Hcl    Oxycodone  Itching  [2]  Outpatient Medications Prior to Visit  Medication Sig   albuterol  (VENTOLIN  HFA) 108 (90 Base) MCG/ACT inhaler Inhale 2 puffs into the lungs every 6 (six) hours as needed for wheezing or shortness of breath.   allopurinol  (ZYLOPRIM ) 100 MG tablet TAKE 1 TABLET BY MOUTH EVERY DAY   aspirin  EC 81 MG tablet Take 1 tablet (81 mg total) by mouth daily. Swallow whole.   enalapril (VASOTEC) 5 MG tablet Take 5 mg by mouth daily.   ezetimibe   (ZETIA ) 10 MG tablet Take 1 tablet (10 mg total) by mouth daily.   fluticasone  (FLONASE ) 50 MCG/ACT nasal spray Place 2 sprays into both nostrils daily. SPRAY 2 SPRAYS INTO EACH NOSTRIL EVERY DAY   furosemide  (LASIX ) 40 MG tablet TAKE 1 TABLET BY MOUTH EVERY DAY   gabapentin  (NEURONTIN ) 100 MG capsule Take 1-3 capsules (100-300 mg total) by mouth at bedtime.   glimepiride  (AMARYL ) 2 MG tablet TAKE 1 TABLET (2 MG TOTAL) BY MOUTH SEE ADMIN INSTRUCTIONS. TAKE 1 MG DAILY, MAY INCREASE TO 2 MG IF BLOOD SUGAR IS OVER 175   hydrALAZINE  (APRESOLINE ) 100 MG tablet Take 1 tablet (100 mg total) by mouth 2 (two) times daily. (Patient taking differently: Take 100 mg by mouth daily.)   hydroxychloroquine  (PLAQUENIL ) 200 MG tablet Take 200 mg by mouth daily.   labetalol  (NORMODYNE ) 300 MG tablet TAKE 1 TABLET BY MOUTH TWICE A DAY   losartan  (COZAAR ) 100 MG tablet TAKE 1 TABLET BY MOUTH EVERY DAY IN THE MORNING   nitroGLYCERIN  (NITROSTAT ) 0.6 MG SL tablet TAKE 1 TABLET BY MOUTH AS NEEDED FOR CP. WAIT 5 MINUTES BEFORE NEXT DOSE. PROCEED TO ER IF NO RELIEF AFTER 3 DOSES (Patient taking differently: Place 0.6 mg under the tongue every 5 (five) minutes as needed.)   ondansetron  (ZOFRAN ) 4 MG tablet Take 1 tablet (4 mg total) by mouth every 8 (eight) hours as needed for nausea or vomiting.   rosuvastatin  (CRESTOR ) 40 MG tablet TAKE 1 TABLET BY MOUTH  EVERY DAY   SYMBICORT 80-4.5 MCG/ACT inhaler INHALE 1 PUFF BY MOUTH TWICE A DAY   traMADol  (ULTRAM ) 50 MG tablet Take 1 tablet (50 mg total) by mouth every 6 (six) hours as needed for moderate pain (pain score 4-6).   tretinoin (RETIN-A) 0.05 % cream APPLY TO AFFECTED AREA EVERY DAY AT BEDTIME   zolpidem  (AMBIEN ) 5 MG tablet Take 1 tablet (5 mg total) by mouth at bedtime as needed. for sleep   calcitRIOL (ROCALTROL) 0.25 MCG capsule Take 0.25 mcg by mouth. (Patient not taking: Reported on 06/25/2024)   carvedilol (COREG) 25 MG tablet Take 25 mg by mouth 2 (two) times daily.  (Patient not taking: Reported on 06/25/2024)   citalopram  (CELEXA ) 10 MG tablet TAKE 1 TABLET BY MOUTH EVERY DAY (Patient not taking: Reported on 06/25/2024)   lidocaine  (LIDODERM ) 5 % Use 1 patch every 24 hours. Can cut patch into smaller pieces to apply to multiple locations remove patch after 12 hours of use. (Patient not taking: Reported on 06/25/2024)   No facility-administered medications prior to visit.   "

## 2024-06-25 NOTE — Progress Notes (Signed)
"  Patient notified   "

## 2024-06-25 NOTE — Progress Notes (Unsigned)
 "  Cardiology Office Note    Date:  06/25/2024   ID:  VENBA Leon, DOB January 16, 1946, MRN [id number]  PCP:  Fernand Fredy RAMAN, MD  Cardiologist:  Deatrice Cage, MD  Electrophysiologist:  None   Chief Complaint: Follow-up  History of Present Illness:   [name] is a 79 y.o. 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 Fernand, establishing care with Dr. Cage in 12/2023.  She underwent PCI/DES to the mid RCA in 2008 by Dr. Cage.  Following that, she had cardiac catheterizations in 2009, 2012, and 2014; all of them showing patent stent with mild restenosis.  LAD had mild nonobstructive disease.  Upon establishing care with our office in 12/2023 she reported chest discomfort in 10/2023, though none since.  Functional status was limited by shortness of breath.  She also reported lower extremity swelling.  She reported undergoing back surgery in 2024 as well as bilateral leg pain with walking.  Physical exam demonstrated normal femoral pulses with PAD felt to be unlikely.  Discomfort was suspected to be neuropathic.  Echo in 01/2024 showed an EF of 55 to 60%, no regional wall motion abnormalities, mild LVH, grade 1 diastolic dysfunction, normal RV systolic function and ventricular cavity size, mildly dilated left atrium, mild mitral regurgitation, aortic valve sclerosis without evidence of stenosis, and an estimated right atrial pressure of 3 mmHg.  She was last seen in the office in 03/2024 and was doing well from a cardiac perspective with stable chronic dyspnea.  She had not yet started previously recommended ezetimibe  with recommendation to start this.  She reported intermittent palpitations that would last for several seconds to a minute or 2.   Zio patch in 03/2024 showed a predominant rhythm of sinus with an average rate of 71 bpm, 12 episodes of SVT lasting up to 12 beats, and occasional PACs with a burden of 3.5%.  Some patient triggered events correlated with PACs.  She followed up with nephrology on 06/28/2024 with progressive renal decline and noted.  With some increased edema they recommended she add metolazone 2.5 mg once weekly to furosemide  40 mg daily.  ***   Labs independently reviewed: 02/2024 - A1c 5.9, TC 205, TG 122, HDL 54, LDL 129, Hgb 9.1, PLT 177, BUN 62, serum creatinine 3.73, potassium 5.5, albumin  4.1, AST/ALT normal 12/2023 - magnesium  2.1  Past Medical History:  Diagnosis Date   Anemia of chronic renal disease    Arthritis    Cerebral aneurysm 06/07/2006   a.) 6.2x4.65mm and 4.9x4.55mm ACOM & 7x4.63mm RMCA aneur. b.)07/18/2006 -endovas oblit complex ACOM aneur. c.) Endovas Tx of 6.8x23mm RMCA aneur. d.) 3.5x43mm remnant RMCA aneur 2/2 coil compaction. e.) Interval 3.2x3.75mm saccular outpouching in ACOM c/w mild recannulization in neck. 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 g.) 3.7x61mm remnant of prev Tx'd ACOM aneur -failed embol 02/24/2012.   CKD (chronic kidney disease), stage IV (HCC)    a.) solitary functioning kidney on the RIGHT   Complication of anesthesia    a.) delayed emergence   Coronary artery disease 02/23/2007   a.) LHC 02/23/2007 --> EF 50%; 30% pLAD, 70% mRCA --> planned for staged PCI. b.) PCI 02/27/2007: EF 60%; 3.5 x 15 mm Vision BMS to 80% mRCA.  b.) LHC 07/24/2007: EF 60%; minor irregs; no occlusive CAD; no intervention. c.) LHC 10/28/2010: EF 60%; 30% mLAD, LCx with minor luminal irregs, 20% ISR mRCA; no intervention. d.) LHC 05/31/2013: EF 60%; 40% mLAD, 30% ISR m-dRCA; no interventions.   DDD (degenerative disc disease), cervical    a.) s/p ACDF C5-C7; hardware in neck; patient appreciates stiffness and issues with mobility   Diastolic dysfunction    a.)  TTE 07/07/2021: EF  87.8%; normal PASP; trace TR/MR; G1DD.   Elevated LEFT hemidiaphragm    Gout    Headache(784.0)    Heart murmur    HLD (hyperlipidemia)    HOH (hard of hearing)    Has hearing aids, doesn't wear   Hyperkalemia    Hyperparathyroidism due to renal insufficiency    Hypertension    Incomplete right bundle branch block (RBBB)    Insomnia    a.) takes zolpidem    Long term (current) use of immunomodulator    a.) on DMARD therapy (hydroxychloroquine ) for RA/SLE Dx.   Lumbar adjacent segment disease with spondylolisthesis    Multiple acquired cysts of kidney    On chronic clopidogrel  therapy    OSA (obstructive sleep apnea)    a.) does NOT use nocturnal pap therapy   Osteoarthritis    Pancreatic cyst    Peripheral vascular disease    Pneumonia    Post-COVID chronic cough    Pulmonary nodule    RBBB (right bundle branch block)    Rectal bleeding    Rheumatoid arthritis (HCC)    a.) on DMARD; hydroxychloriquine   Shortness of breath 03/01/2023   Systemic lupus erythematosus (HCC)    T2DM (type 2 diabetes mellitus) (HCC)    Tendinitis of left wrist    Thickened endometrium    Thickened endometrium    Wears dentures    partial lower    Past Surgical History:  Procedure Laterality Date   ANEURYSM COILING  08/2013   ANTERIOR LATERAL LUMBAR FUSION WITH PERCUTANEOUS SCREW 1 LEVEL N/A 03/09/2023   Procedure: L3-4 LATERAL LUMBAR INTERBODY FUSION;  Surgeon: Clois Fret, MD;  Location: [facility];  Service: Neurosurgery;  Laterality: N/A;   APPLICATION OF INTRAOPERATIVE CT SCAN N/A 03/09/2023   Procedure: APPLICATION OF INTRAOPERATIVE CT SCAN;  Surgeon: Clois Fret, MD;  Location: [facility];  Service: Neurosurgery;  Laterality: N/A;   Attempted embolization of previously treated ACOM aneurysm; unsuccessful N/A 01/28/2012   Location: [facility]   BACK SURGERY     BARTHOLIN CYST MARSUPIALIZATION     BICEPT TENODESIS Right 03/02/2022   Procedure: BICEPS TENODESIS;   Surgeon: Edie Norleen PARAS, MD;  Location: [facility];  Service: Orthopedics;  Laterality: Right;   BREAST CYST ASPIRATION Right 01/25/2012   FNA neg.   BREAST EXCISIONAL BIOPSY Left 06/26/2007   neg   BUNIONECTOMY Bilateral 2022   CARDIAC CATHETERIZATION Left 02/23/2007   Procedure: CARDIAC CATHETERIZATION; Location: [facility]; Surgeon: Denyse Bathe, MD   CARDIAC CATHETERIZATION Left 07/31/2007   Procedure: CARDIAC CATHETERIZATION; Location: [facility]; Surgeon: Denyse Bathe, MD   CARDIAC CATHETERIZATION Left 10/28/2010   Procedure: CARDIAC CATHETERIZATION; Location: [facility]; Surgeon: Denyse Bathe, MD   CARDIAC CATHETERIZATION Left 05/31/2013   Procedure: CARDIAC CATHETERIZATION; Location: [facility]; Surgeon: Denyse Bathe, MD   Carotid arteriogram; 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 N/A 07/18/2006   Location: [facility]   CATARACT EXTRACTION     CERVICAL FUSION N/A    Procedure: ACDF C5-C7   COLONOSCOPY WITH PROPOFOL  N/A 10/04/2019  Procedure: COLONOSCOPY WITH BIOPSY;  Surgeon: Unk Corinn Skiff, MD;  Location: [facility];  Service: Endoscopy;  Laterality: N/A;  Diabetic (borderline) - oral meds priority 3   CORONARY STENT INTERVENTION Left 02/27/2007   Procedure: CORONARY STENT INTERVENTION (3.5 x 15 mm Vision BMS to mRCA); Location: [facility]; Surgeon: Deatrice Cage, MD   Endovascular near complete obliteration of enlarging neck remnant of previously treated RIGHT MCA aneurysm using stent assisted coiling N/A 09/03/2010   Location: [facility]   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 N/A 10/03/2006   Location: [facility]   ESOPHAGOGASTRODUODENOSCOPY N/A 04/06/2024   Procedure: EGD (ESOPHAGOGASTRODUODENOSCOPY);  Surgeon: Rollin Dover, MD;  Location: [facility];  Service: Gastroenterology;  Laterality: N/A;   EUS N/A 04/06/2024   Procedure: ULTRASOUND, UPPER GI TRACT, ENDOSCOPIC;  Surgeon: Rollin Dover, MD;  Location: [facility];  Service: Gastroenterology;  Laterality: N/A;   FINE NEEDLE ASPIRATION  04/06/2024   Procedure: FINE NEEDLE ASPIRATION;  Surgeon: Rollin Dover, MD;  Location: [facility];  Service: Gastroenterology;;   HYSTEROSCOPY WITH D & C N/A 08/04/2021   Procedure: DILATATION AND CURETTAGE /HYSTEROSCOPY;  Surgeon: Victor Claudell SAUNDERS, MD;  Location: [facility];  Service: Gynecology;  Laterality: N/A;   POLYPECTOMY N/A 10/04/2019   Procedure: POLYPECTOMY;  Surgeon: Unk Corinn Skiff, MD;  Location: [facility];  Service: Endoscopy;  Laterality: N/A;   REVERSE SHOULDER ARTHROPLASTY Right 03/02/2022   Procedure: REVERSE SHOULDER ARTHROPLASTY;  Surgeon: Edie Norleen PARAS, MD;  Location: [facility];  Service: Orthopedics;  Laterality: Right;   UPPER ESOPHAGEAL ENDOSCOPIC ULTRASOUND (EUS) N/A 04/15/2016   Procedure: UPPER ESOPHAGEAL ENDOSCOPIC ULTRASOUND (EUS);  Surgeon: Asberry DELENA Coffee, MD;  Location: [facility];  Service: Gastroenterology;  Laterality: N/A;    Current Medications: Active Medications[1]  Allergies:   Isosorbide dinitrate, Amlodipine, Sulfa antibiotics, Tizanidine hcl, and Oxycodone    Social History   Socioeconomic History   Marital status: Widowed    Spouse name: Not on file   Number of children: Not on file   Years of education: Not on file   Highest education level: Not on file  Occupational History   Not on file  Tobacco Use   Smoking status: Never    Passive exposure: Never   Smokeless tobacco: Never  Vaping Use   Vaping status: Never Used  Substance and Sexual Activity   Alcohol use: Yes    Comment: Occasionally at christmas   Drug use: No   Sexual activity: Not Currently    Birth control/protection: Post-menopausal  Other Topics Concern   Not on file  Social History Narrative   Lives with daughter   Social Drivers of Health   Tobacco Use: Low Risk (06/25/2024)   Patient History    Smoking Tobacco Use: Never     Smokeless Tobacco Use: Never    Passive Exposure: Never  Financial Resource Strain: Low Risk (02/01/2024)   Overall Financial Resource Strain (CARDIA)    Difficulty of Paying Living Expenses: Not hard at all  Food Insecurity: No Food Insecurity (06/06/2024)   Epic    Worried About Radiation Protection Practitioner of Food in the Last Year: Never true    Ran Out of Food in the Last Year: Never true  Transportation Needs: No Transportation Needs (06/06/2024)   Epic    Lack of Transportation (Medical): No    Lack of Transportation (Non-Medical): No  Physical Activity: Insufficiently Active (02/01/2024)   Exercise Vital Sign    Days of Exercise  per Week: 2 days    Minutes of Exercise per Session: 30 min  Stress: No Stress Concern Present (10/11/2023)   [employer] of Occupational Health - Occupational Stress Questionnaire    Feeling of Stress : Not at all  Social Connections: Moderately Isolated (10/11/2023)   Social Connection and Isolation Panel    Frequency of Communication with Friends and Family: More than three times a week    Frequency of Social Gatherings with Friends and Family: More than three times a week    Attends Religious Services: Never    Database Administrator or Organizations: Yes    Attends Banker Meetings: Never    Marital Status: Widowed  Depression (PHQ2-9): Low Risk (06/06/2024)   Depression (PHQ2-9)    PHQ-2 Score: 1  Alcohol Screen: Low Risk (05/31/2023)   Alcohol Screen    Last Alcohol Screening Score (AUDIT): 0  Housing: Low Risk (06/06/2024)   Epic    Unable to Pay for Housing in the Last Year: No    Number of Times Moved in the Last Year: 0    Homeless in the Last Year: No  Utilities: Not At Risk (06/06/2024)   Epic    Threatened with loss of utilities: No  Health Literacy: Adequate Health Literacy (02/01/2024)   B1300 Health Literacy    Frequency of need for help with medical instructions: Never     Family History:  The patient's family history  includes Breast cancer in her paternal aunt; Breast cancer (age of onset: 67) in her paternal aunt; Diabetes in her brother and sister; Heart disease in her father and mother; Stroke in her father.  ROS:   12-point review of systems is negative unless otherwise noted in the HPI.   EKGs/Labs/Other Studies Reviewed:    Studies reviewed were summarized above. The additional studies were reviewed today:  2D echo 01/30/2024: 1. Left ventricular ejection fraction, by estimation, is 55 to 60%. The  left ventricle has normal function. The left ventricle has no regional  wall motion abnormalities. There is mild left ventricular hypertrophy.  Left ventricular diastolic parameters  are consistent with Grade I diastolic dysfunction (impaired relaxation).  The average left ventricular global longitudinal strain is -16.9 %. The  global longitudinal strain is abnormal.   2. Right ventricular systolic function is normal. The right ventricular  size is normal.   3. Left atrial size was mildly dilated.   4. The mitral valve is normal in structure. Mild mitral valve  regurgitation.   5. The aortic valve is tricuspid. Aortic valve regurgitation is not  visualized. Aortic valve sclerosis/calcification is present, without any  evidence of aortic stenosis.   6. The inferior vena cava is normal in size with greater than 50%  respiratory variability, suggesting right atrial pressure of 3 mmHg. __________  Zio patch 03/2024: Patient had a min HR of 54 bpm, max HR of 169 bpm, and avg HR of 71 bpm. Predominant underlying rhythm was Sinus Rhythm. Slight P wave morphology changes were noted.  12 Supraventricular Tachycardia runs occurred, the run with the fastest interval lasting 5 beats with a max rate of 169 bpm, the longest lasting 12 beats with an avg rate of 95 bpm. Occasional PACs with a burden of 3.5%. Some triggered events correlated with PACs.    EKG:  EKG is ordered today.  The EKG ordered today  demonstrates ***  Recent Labs: 03/08/2024: ALT 10; BUN 62; Creatinine, Ser 3.73; Hemoglobin 9.1; Platelets 177; Potassium  5.5; Sodium 145  Recent Lipid Panel    Component Value Date/Time   CHOL 205 (H) 03/08/2024 1459   CHOL 193 05/31/2013 0044   TRIG 122 03/08/2024 1459   TRIG 105 05/31/2013 0044   HDL 54 03/08/2024 1459   HDL 39 (L) 05/31/2013 0044   CHOLHDL 5.8 (H) 12/16/2022 1501   VLDL 21 05/31/2013 0044   LDLCALC 129 (H) 03/08/2024 1459   LDLCALC 133 (H) 05/31/2013 0044    PHYSICAL EXAM:    VS:  There were no vitals taken for this visit.  BMI: There is no height or weight on file to calculate BMI.  Physical Exam  Wt Readings from Last 3 Encounters:  06/25/24 179 lb 6.4 oz (81.4 kg)  06/08/24 183 lb (83 kg)  06/06/24 180 lb (81.6 kg)     ASSESSMENT & PLAN:   CAD involving the native coronary arteries without angina:  HTN: Blood pressure  HLD: LDL 129 in 02/2024.  Palpitations:  CKD stage V:   {Are you ordering a CV Procedure (e.g. stress test, cath, DCCV, TEE, etc)?   Press F2        :[id number]}     Disposition: F/u with Dr. Darron or an APP in ***.   Medication Adjustments/Labs and Tests Ordered: Current medicines are reviewed at length with the patient today.  Concerns regarding medicines are outlined above. Medication changes, Labs and Tests ordered today are summarized above and listed in the Patient Instructions accessible in Encounters.   Bonney Bernardino Bring, PA-C 06/25/2024 11:33 AM     [facility] [address] [place], [place] [address] [phone]     [1]  No outpatient medications have been marked as taking for the 06/29/24 encounter (Appointment) with Bring Bernardino HERO, PA-C.   "

## 2024-06-26 ENCOUNTER — Encounter: Payer: Self-pay | Admitting: Student in an Organized Health Care Education/Training Program

## 2024-06-26 ENCOUNTER — Ambulatory Visit
Attending: Student in an Organized Health Care Education/Training Program | Admitting: Student in an Organized Health Care Education/Training Program

## 2024-06-26 VITALS — BP 185/76 | HR 67 | Temp 97.4°F | Resp 18 | Ht 63.0 in | Wt 180.0 lb

## 2024-06-26 DIAGNOSIS — G5703 Lesion of sciatic nerve, bilateral lower limbs: Secondary | ICD-10-CM | POA: Insufficient documentation

## 2024-06-26 DIAGNOSIS — M5481 Occipital neuralgia: Secondary | ICD-10-CM | POA: Insufficient documentation

## 2024-06-26 DIAGNOSIS — M533 Sacrococcygeal disorders, not elsewhere classified: Secondary | ICD-10-CM | POA: Insufficient documentation

## 2024-06-26 DIAGNOSIS — M461 Sacroiliitis, not elsewhere classified: Secondary | ICD-10-CM | POA: Diagnosis not present

## 2024-06-26 LAB — CMP14+EGFR
ALT: 9 IU/L (ref 0–32)
AST: 12 IU/L (ref 0–40)
Albumin: 3.8 g/dL (ref 3.8–4.8)
Alkaline Phosphatase: 70 IU/L (ref 49–135)
BUN/Creatinine Ratio: 15 (ref 12–28)
BUN: 69 mg/dL — ABNORMAL HIGH (ref 8–27)
Bilirubin Total: 0.5 mg/dL (ref 0.0–1.2)
CO2: 17 mmol/L — ABNORMAL LOW (ref 20–29)
Calcium: 8.8 mg/dL (ref 8.7–10.3)
Chloride: 118 mmol/L (ref 96–106)
Creatinine, Ser: 4.54 mg/dL — ABNORMAL HIGH (ref 0.57–1.00)
Globulin, Total: 1.7 g/dL (ref 1.5–4.5)
Glucose: 103 mg/dL — ABNORMAL HIGH (ref 70–99)
Potassium: 4.7 mmol/L (ref 3.5–5.2)
Sodium: 149 mmol/L — ABNORMAL HIGH (ref 134–144)
Total Protein: 5.5 g/dL — ABNORMAL LOW (ref 6.0–8.5)
eGFR: 9 mL/min/1.73 — ABNORMAL LOW

## 2024-06-26 LAB — CBC WITH DIFFERENTIAL/PLATELET
Basophils Absolute: 0 x10E3/uL (ref 0.0–0.2)
Basos: 1 %
EOS (ABSOLUTE): 0.2 x10E3/uL (ref 0.0–0.4)
Eos: 4 %
Hematocrit: 26.8 % — ABNORMAL LOW (ref 34.0–46.6)
Hemoglobin: 8.4 g/dL — ABNORMAL LOW (ref 11.1–15.9)
Immature Grans (Abs): 0 x10E3/uL (ref 0.0–0.1)
Immature Granulocytes: 0 %
Lymphocytes Absolute: 1.5 x10E3/uL (ref 0.7–3.1)
Lymphs: 31 %
MCH: 29.5 pg (ref 26.6–33.0)
MCHC: 31.3 g/dL — ABNORMAL LOW (ref 31.5–35.7)
MCV: 94 fL (ref 79–97)
Monocytes Absolute: 0.2 x10E3/uL (ref 0.1–0.9)
Monocytes: 5 %
Neutrophils Absolute: 2.9 x10E3/uL (ref 1.4–7.0)
Neutrophils: 59 %
Platelets: 156 x10E3/uL (ref 150–450)
RBC: 2.85 x10E6/uL — ABNORMAL LOW (ref 3.77–5.28)
RDW: 13 % (ref 11.7–15.4)
WBC: 4.9 x10E3/uL (ref 3.4–10.8)

## 2024-06-26 LAB — LIPID PANEL W/O CHOL/HDL RATIO
Cholesterol, Total: 196 mg/dL (ref 100–199)
HDL: 40 mg/dL
LDL Chol Calc (NIH): 128 mg/dL — ABNORMAL HIGH (ref 0–99)
Triglycerides: 156 mg/dL — ABNORMAL HIGH (ref 0–149)
VLDL Cholesterol Cal: 28 mg/dL (ref 5–40)

## 2024-06-26 LAB — HEMOGLOBIN A1C
Est. average glucose Bld gHb Est-mCnc: 111 mg/dL
Hgb A1c MFr Bld: 5.5 % (ref 4.8–5.6)

## 2024-06-26 NOTE — Progress Notes (Signed)
 Safety precautions to be maintained throughout the outpatient stay will include: orient to surroundings, keep bed in low position, maintain call bell within reach at all times, provide assistance with transfer out of bed and ambulation.

## 2024-06-26 NOTE — Progress Notes (Signed)
 PROVIDER NOTE: Interpretation of information contained herein should be left to medically-trained personnel. Specific patient instructions are provided elsewhere under Patient Instructions section of medical record. This document was created in part using AI and STT-dictation technology, any transcriptional errors that may result from this process are unintentional.  Patient: [name]  Service: E/M   PCP: Fernand Fredy RAMAN, MD  DOB: 03-Sep-1945  DOS: 06/26/2024  Provider: Wallie Sherry, MD  MRN: [id number]  Delivery: Face-to-face  Specialty: Interventional Pain Management  Type: Established Patient  Setting: Ambulatory outpatient facility  Specialty designation: 09  Referring Prov.: Fernand Fredy RAMAN, MD  Location: Outpatient office facility       History of present illness (HPI) [name], a 79 y.o. year old female, is here today because of her Sacroiliac joint pain [M53.3]. [name] primary complain today is Hip Pain (right) and Neck Pain (Right side radiating up into head)  Pertinent problems: Ms. Achorn does not have any pertinent problems on file.  Pain Assessment: Severity of Chronic pain is reported as a 6 /10. Location: Hip Right/denies. Onset: More than a month ago. Quality: Discomfort, Constant. Timing: Constant. Modifying factor(s): nothing. Vitals:  height is 5' 3 (1.6 m) and weight is 180 lb (81.6 kg). Her temperature is 97.4 F (36.3 C) (abnormal). Her blood pressure is 185/76 (abnormal) and her pulse is 67. Her respiration is 18 and oxygen saturation is 98%.  BMI: Estimated body mass index is 31.89 kg/m as calculated from the following:   Height as of this encounter: 5' 3 (1.6 m).   Weight as of this encounter: 180 lb (81.6 kg).  Last encounter: 03/28/2024. Last procedure: 04/23/2024.  Reason for encounter: Postprocedural follow-up, worsening buttock and SI joint pain, occipital pain History of Present Illness   [name] is a 79 year old  female who presents with persistent pain despite previous treatments.  She experiences persistent pain despite previous spinal injections, which did not provide even short-term relief. She takes APAP Arthritis, 650 mg, two tablets daily, which provides minimal relief.  She describes pain in the neck, specifically on the right side, radiating upwards and over the head. The pain sometimes feels like a 'crawly thing' on the side of her neck. She has had recent x-rays of her neck.  She also experiences pain in the lower back and buttocks, particularly around the SI joint area, which she can feel when rubbing the bone near her hip. Previous treatments included clonial injections and nerve blocks, which were not helpful.  She is planning a trip to [place] and wants to be able to walk comfortably during her travels. She lives close to the medical facility, approximately fifteen minutes away.  She reports that her pain is predominantly on the right side and not on the left.       ROS  Constitutional: Denies any fever or chills Gastrointestinal: No reported hemesis, hematochezia, vomiting, or acute GI distress Musculoskeletal: SI joint pain Neurological: Occipital neuralgia  Medication Review  albuterol , allopurinol , aspirin  EC, budesonide-formoterol , calcitRIOL, carisoprodol , carvedilol, citalopram , enalapril, ezetimibe , fluticasone , furosemide , gabapentin , glimepiride , hydrALAZINE , hydroxychloroquine , labetalol , lidocaine , losartan , nitroGLYCERIN , ondansetron , rosuvastatin , traMADol , tretinoin, and zolpidem   History Review  Allergy: [name] is allergic to isosorbide dinitrate, amlodipine, sulfa antibiotics, tizanidine hcl, and oxycodone . Drug: [name]  reports no history of drug use. Alcohol:  reports current alcohol use. Tobacco:  reports that she has never smoked. She has never been exposed to tobacco smoke. She has never used  smokeless tobacco. Social: Ms. Folsom  reports  that she has never smoked. She has never been exposed to tobacco smoke. She has never used smokeless tobacco. She reports current alcohol use. She reports that she does not use drugs. Medical:  has a past medical history of Anemia of chronic renal disease, Arthritis, Cerebral aneurysm (06/07/2006), CKD (chronic kidney disease), stage IV (HCC), Complication of anesthesia, Coronary artery disease (02/23/2007), DDD (degenerative disc disease), cervical, Diastolic dysfunction, Elevated LEFT hemidiaphragm, Gout, Headache(784.0), Heart murmur, HLD (hyperlipidemia), HOH (hard of hearing), Hyperkalemia, Hyperparathyroidism due to renal insufficiency, Hypertension, Incomplete right bundle branch block (RBBB), Insomnia, Long term (current) use of immunomodulator, Lumbar adjacent segment disease with spondylolisthesis, Multiple acquired cysts of kidney, On chronic clopidogrel  therapy, OSA (obstructive sleep apnea), Osteoarthritis, Pancreatic cyst, Peripheral vascular disease, Pneumonia, Post-COVID chronic cough, Pulmonary nodule, RBBB (right bundle branch block), Rectal bleeding, Rheumatoid arthritis (HCC), Shortness of breath (03/01/2023), Systemic lupus erythematosus (HCC), T2DM (type 2 diabetes mellitus) (HCC), Tendinitis of left wrist, Thickened endometrium, Thickened endometrium, and Wears dentures. Surgical: [name]  has a past surgical history that includes Cataract extraction; Cervical fusion (N/A); Cardiac catheterization (Left, 02/23/2007); Aneurysm coiling (08/2013); Back surgery; Bartholin cyst marsupialization; Upper esophageal endoscopic ultrasound (eus) (N/A, 04/15/2016); Breast cyst aspiration (Right, 01/25/2012); Colonoscopy with propofol  (N/A, 10/04/2019); polypectomy (N/A, 10/04/2019); Breast excisional biopsy (Left, 06/26/2007); Bunionectomy (Bilateral, 2022); CORONARY STENT INTERVENTION (Left, 02/27/2007); Cardiac catheterization (Left, 07/31/2007); Cardiac catheterization (Left, 10/28/2010);  Cardiac catheterization (Left, 05/31/2013); Carotid arteriogram; 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 (N/A, 07/18/2006); 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 (N/A, 10/03/2006); Endovascular near complete 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 Family: family history includes Breast cancer in her paternal aunt; Breast cancer (age of onset: 10) in her paternal aunt; Diabetes in her brother and sister; Heart disease in her father and mother; Stroke in her father.  Laboratory Chemistry Profile   Renal Lab Results  Component Value Date   BUN 69 (H) 06/25/2024   CREATININE 4.54 (H) 06/25/2024   LABCREA 120 12/26/2023   BCR 15 06/25/2024   GFRAA 27 (L) 09/11/2019   GFRNONAA 15 (L) 12/05/2023    Hepatic Lab Results  Component Value Date   AST 12 06/25/2024   ALT 9 06/25/2024   ALBUMIN  3.8 06/25/2024   ALKPHOS 70 06/25/2024   LIPASE 35 03/13/2023    Electrolytes Lab Results  Component Value Date   NA 149 (H) 06/25/2024   K 4.7 06/25/2024   CL 118 (HH) 06/25/2024   CALCIUM  8.8 06/25/2024   MG 1.9 05/31/2013    Bone No results found for: VD25OH, VD125OH2TOT, CI6874NY7, CI7874NY7, 25OHVITD1, 25OHVITD2, 25OHVITD3, TESTOFREE, TESTOSTERONE  Inflammation (CRP: Acute Phase) (ESR: Chronic Phase) No results found for: CRP, ESRSEDRATE, LATICACIDVEN        Note: Above Lab results reviewed.  Recent Imaging Review  CT HEAD WO CONTRAST ( ) CLINICAL DATA:  Headache, increasing frequency or severity, history of previous brain aneurysm coiling  EXAM: CT HEAD WITHOUT CONTRAST  TECHNIQUE: Contiguous axial images were obtained from the base of the skull through the vertex without intravenous contrast.  RADIATION  DOSE REDUCTION: This exam was performed according to the departmental dose-optimization program which includes automated exposure control, adjustment of the mA and/or kV according to patient size and/or use of iterative reconstruction technique.  COMPARISON:  11/17/2023  FINDINGS: Brain: Similar streak artifact from the previous coil embolizations in the midline and right middle cranial fossa.  Similar patchy scattered cerebral white matter microvascular ischemic changes throughout both cerebral hemispheres. No acute intracranial hemorrhage, new infarction, mass lesion, midline shift, herniation, hydrocephalus, or extra-axial fluid collection. No focal mass effect or edema. Cisterns are patent. No cerebellar abnormality.  Vascular: Stable appearance of the anterior communicating artery and right MCA bifurcation embolization coils.  Skull: Normal. Negative for fracture or focal lesion.  Sinuses/Orbits: No acute finding.  Other: None.  IMPRESSION: 1. No acute intracranial abnormality by noncontrast CT. 2. Stable appearance of the anterior communicating artery and right MCA bifurcation embolization coils. 3. Stable cerebral white matter microvascular ischemic changes.  Electronically Signed   By: CHRISTELLA.  Shick M.D.   On: 06/25/2024 13:37 Note: Reviewed        Physical Exam  Vitals: BP (!) 185/76   Pulse 67   Temp (!) 97.4 F (36.3 C)   Resp 18   Ht 5' 3 (1.6 m)   Wt 180 lb (81.6 kg)   SpO2 98%   BMI 31.89 kg/m  BMI: Estimated body mass index is 31.89 kg/m as calculated from the following:   Height as of  this encounter: 5' 3 (1.6 m).   Weight as of this encounter: 180 lb (81.6 kg). Ideal: Ideal body weight: 52.4 kg (115 lb 8.3 oz) Adjusted ideal body weight: 64.1 kg (141 lb 5 oz) General appearance: Well nourished, well developed, and well hydrated. In no apparent acute distress Mental status: Alert, oriented x 3 (person, place, & time)       Respiratory: No evidence of acute respiratory distress Eyes: PERLA  Occipital neuralgia  Lumbar Spine Area Exam  Skin & Axial Inspection: Well healed scar from previous spine surgery detected Alignment: Symmetrical Functional ROM: Pain restricted ROM       Stability: No instability detected Muscle Tone/Strength: Functionally intact. No obvious neuro-muscular anomalies detected. Sensory (Neurological): Referred pain pattern Palpation: No palpable anomalies       Provocative Tests: Hyperextension/rotation test: deferred today       Lumbar quadrant test (Kemp's test): deferred today       Lateral bending test: deferred today       Patrick's Maneuver: (+) for bilateral S-I arthralgia             FABER* test: (+) for bilateral S-I arthralgia             S-I anterior distraction/compression test: (+) for bilateral S-I arthralgia             S-I lateral compression test: (+) for bilateral S-I arthralgia             S-I Thigh-thrust test: (+) for bilateral S-I arthralgia             S-I Gaenslen's test: (+) for bilateral S-I arthralgia             *(Flexion, ABduction and External Rotation) Gait & Posture Assessment  Ambulation: Limited Gait: Antalgic gait (limping) Posture: WNL  Lower Extremity Exam      Side: Right lower extremity   Side: Left lower extremity  Stability: No instability observed           Stability: No  instability observed          Skin & Extremity Inspection: Skin color, temperature, and hair growth are WNL. No peripheral edema or cyanosis. No masses, redness, swelling, asymmetry, or associated skin lesions. No contractures.    Skin & Extremity Inspection: Skin color, temperature, and hair growth are WNL. No peripheral edema or cyanosis. No masses, redness, swelling, asymmetry, or associated skin lesions. No contractures.  Functional ROM: Unrestricted ROM                   Functional ROM: Unrestricted ROM                  Muscle Tone/Strength: Functionally intact. No obvious neuro-muscular anomalies detected.   Muscle Tone/Strength: Functionally intact. No obvious neuro-muscular anomalies detected.  Sensory (Neurological): Unimpaired         Sensory (Neurological): Unimpaired        DTR: Patellar: deferred today Achilles: deferred today Plantar: deferred today   DTR: Patellar: deferred today Achilles: deferred today Plantar: deferred today  Palpation: No palpable anomalies   Palpation: No palpable anomalies     Assessment   Diagnosis  1. Sacroiliac joint pain   2. Bilateral occipital neuralgia   3. SI joint arthritis   4. Piriformis syndrome of both sides      Updated Problems: Problem  Bilateral Occipital Neuralgia    Plan of Care  Problem-specific:  Assessment and Plan    Bilateral occipital neuralgia   Pain radiates from the neck to the scalp, mainly on the right side. Previous cervical spine x-rays were reviewed. An occipital nerve block will be performed to address inflamed nerves causing headaches. Nerve ablation will be considered if short-term relief is achieved but not sustained.  Sacroiliac joint pain   Chronic sacroiliac joint.  Positive diagnostic tests above.  Pain is localized to the right side, with occasional pain on the left.   Patients with a history of multilevel or extensive lumbar fusion are at significantly increased risk of developing sacroiliac (SI) joint dysfunction and degenerative arthropathy due to altered biomechanics and increased load transfer across the SI joints. In many series, the SI joint is identified as a pain generator in a substantial proportion of patients  with persistent or recurrent low-back/buttock pain after fusion. Given the pattern of pain and exam findings, a diagnostic SI joint injection is recommended to help confirm the SI joint as a source of symptoms and to guide further treatment planning.  This will be done with a piriformis injection/TPI.  Patient has significant L2-L5 lumbar spinal fusion.      [name] has a current medication list which includes the following long-term medication(s): albuterol , allopurinol , carvedilol, citalopram , ezetimibe , fluticasone , furosemide , gabapentin , glimepiride , hydralazine , labetalol , losartan , nitroglycerin , rosuvastatin , symbicort, and zolpidem .  Pharmacotherapy (Medications Ordered): No orders of the defined types were placed in this encounter.  Orders:  Orders Placed This Encounter  Procedures   GREATER OCCIPITAL NERVE BLOCK    Standing Status:   Future    Expected Date:   06/27/2024    Expiration Date:   06/26/2025    Scheduling Instructions:     Procedure: Occipital nerve block     Laterality: Bilateral     Sedation: Patient's choice.     Timeframe: ASAA    Where will this procedure be performed?:   [facility]   SACROILIAC JOINT INJECTION    Physical Examination Findings: Positive Sacral Thrust (Sacral Spring, Downward Pressure): (Y) Positive FABER maneuver (  Patrick's): (Y) Positive SI distraction (Gapping): (Y) Positive SI compression (Approximation): (Y) Positive Thigh Thrust:  (Y) Positive Gaenslen's: (Y) Positive Sacral Sulcus Tenderness: (Y)    Standing Status:   Future    Expiration Date:   09/24/2024    Scheduling Instructions:     Procedure: Sacroiliac Joint Injection and Bilateral Piriformis TPI     Side  Laterality: Bilateral     Sedation: Patient's choice.     Timeframe: As soon as schedule allows.    Where will this procedure be performed?:   [facility]     B/L Cluneal NB 08/24/23, B/L L4,5, S1 MBNB 03/07/24, 04/23/24   Return in  about 1 day (around 06/27/2024) for B/L Occipital NB + B/L SIJ/Piriformis TPI, in clinic NS.    Recent Visits Date Type Provider Dept  04/23/24 Procedure visit Marcelino Nurse, MD [facility]  03/28/24 Office Visit Marcelino Nurse, MD [facility]  Showing recent visits within past 90 days and meeting all other requirements Today's Visits Date Type Provider Dept  06/26/24 Office Visit Marcelino Nurse, MD [facility]  Showing today's visits and meeting all other requirements Future Appointments No visits were found meeting these conditions. Showing future appointments within next 90 days and meeting all other requirements  I discussed the assessment and treatment plan with the patient. The patient was provided an opportunity to ask questions and all were answered. The patient agreed with the plan and demonstrated an understanding of the instructions.  Patient advised to call back or seek an in-person evaluation if the symptoms or condition worsens.  I personally spent a total of 30 minutes in the care of the patient today including preparing to see the patient, getting/reviewing separately obtained history, performing a medically appropriate exam/evaluation, counseling and educating, placing orders, and documenting clinical information in the EHR.   Note by: Nurse Marcelino, MD (TTS and AI technology used. I apologize for any typographical errors that were not detected and corrected.) Date: 06/26/2024; Time: 3:19 PM

## 2024-06-26 NOTE — Patient Instructions (Signed)
 " ______________________________________________________________________    General Risks and Possible Complications  Patient Responsibilities: It is important that you read this as it is part of your informed consent. It is our duty to inform you of the risks and possible complications associated with treatments offered to you. It is your responsibility as a patient to read this and to ask questions about anything that is not clear or that you believe was not covered in this document.  Patients Rights: You have the right to refuse treatment. You also have the right to change your mind, even after initially having agreed to have the treatment done. However, under this last option, if you wait until the last second to change your mind, you may be charged for the materials used up to that point.  Introduction: Medicine is not an visual merchandiser. Everything in Medicine, including the lack of treatment(s), carries the potential for danger, harm, or loss (which is by definition: Risk). In Medicine, a complication is a secondary problem, condition, or disease that can aggravate an already existing one. All treatments carry the risk of possible complications. The fact that a side effects or complications occurs, does not imply that the treatment was conducted incorrectly. It must be clearly understood that these can happen even when everything is done following the highest safety standards.  No treatment: You can choose not to proceed with the proposed treatment alternative. The PRO(s) would include: avoiding the risk of complications associated with the therapy. The CON(s) would include: not getting any of the treatment benefits. These benefits fall under one of three categories: diagnostic; therapeutic; and/or palliative. Diagnostic benefits include: getting information which can ultimately lead to improvement of the disease or symptom(s). Therapeutic benefits are those associated with the successful  treatment of the disease. Finally, palliative benefits are those related to the decrease of the primary symptoms, without necessarily curing the condition (example: decreasing the pain from a flare-up of a chronic condition, such as incurable terminal cancer).  General Risks and Complications: These are associated to most interventional treatments. They can occur alone, or in combination. They fall under one of the following six (6) categories: no benefit or worsening of symptoms; bleeding; infection; nerve damage; allergic reactions; and/or death. No benefits or worsening of symptoms: In Medicine there are no guarantees, only probabilities. No healthcare provider can ever guarantee that a medical treatment will work, they can only state the probability that it may. Furthermore, there is always the possibility that the condition may worsen, either directly, or indirectly, as a consequence of the treatment. Bleeding: This is more common if the patient is taking a blood thinner, either prescription or over the counter (example: Goody Powders, Fish oil, Aspirin , Garlic, etc.), or if suffering a condition associated with impaired coagulation (example: Hemophilia, cirrhosis of the liver, low platelet counts, etc.). However, even if you do not have one on these, it can still happen. If you have any of these conditions, or take one of these drugs, make sure to notify your treating physician. Infection: This is more common in patients with a compromised immune system, either due to disease (example: diabetes, cancer, human immunodeficiency virus [HIV], etc.), or due to medications or treatments (example: therapies used to treat cancer and rheumatological diseases). However, even if you do not have one on these, it can still happen. If you have any of these conditions, or take one of these drugs, make sure to notify your treating physician. Nerve Damage: This is more common when the treatment  is an invasive one, but it  can also happen with the use of medications, such as those used in the treatment of cancer. The damage can occur to small secondary nerves, or to large primary ones, such as those in the spinal cord and brain. This damage may be temporary or permanent and it may lead to impairments that can range from temporary numbness to permanent paralysis and/or brain death. Allergic Reactions: Any time a substance or material comes in contact with our body, there is the possibility of an allergic reaction. These can range from a mild skin rash (contact dermatitis) to a severe systemic reaction (anaphylactic reaction), which can result in death. Death: In general, any medical intervention can result in death, most of the time due to an unforeseen complication. ______________________________________________________________________     Sacroiliac (SI) Joint Injection Patient Information  Description: The sacroiliac joint connects the scrum (very low back and tailbone) to the ilium (a pelvic bone which also forms half of the hip joint).  Normally this joint experiences very little motion.  When this joint becomes inflamed or unstable low back and or hip and pelvis pain may result.  Injection of this joint with local anesthetics (numbing medicines) and steroids can provide diagnostic information and reduce pain.  This injection is performed with the aid of x-ray guidance into the tailbone area while you are lying on your stomach.   You may experience an electrical sensation down the leg while this is being done.  You may also experience numbness.  We also may ask if we are reproducing your normal pain during the injection.  Conditions which may be treated SI injection:  Low back, buttock, hip or leg pain  Preparation for the Injection:  Do not eat any solid food or dairy products within 8 hours of your appointment.  You may drink clear liquids up to 3 hours before appointment.  Clear liquids include water , black  coffee, juice or soda.  No milk or cream please. You may take your regular medications, including pain medications with a sip of water  before your appointment.  Diabetics should hold regular insulin  (if take separately) and take 1/2 normal NPH dose the morning of the procedure.  Carry some sugar containing items with you to your appointment. A driver must accompany you and be prepared to drive you home after your procedure. Bring all of your current medications with you. An IV may be inserted and sedation may be given at the discretion of the physician. A blood pressure cuff, EKG and other monitors will often be applied during the procedure.  Some patients may need to have extra oxygen administered for a short period.  You will be asked to provide medical information, including your allergies, prior to the procedure.  We must know immediately if you are taking blood thinners (like Coumadin/Warfarin) or if you are allergic to IV iodine  contrast (dye).  We must know if you could possible be pregnant.  Possible side effects:  Bleeding from needle site Infection (rare, may require surgery) Nerve injury (rare) Numbness & tingling (temporary) A brief convulsion or seizure Light-headedness (temporary) Pain at injection site (several days) Decreased blood pressure (temporary) Weakness in the leg (temporary)   Call if you experience:  New onset weakness or numbness of an extremity below the injection site that last more than 8 hours. Hives or difficulty breathing ( go to the emergency room) Inflammation or drainage at the injection site Any new symptoms which are concerning to you  Please note:  Although the local anesthetic injected can often make your back/ hip/ buttock/ leg feel good for several hours after the injections, the pain will likely return.  It takes 3-7 days for steroids to work in the sacroiliac area.  You may not notice any pain relief for at least that one week.  If  effective, we will often do a series of three injections spaced 3-6 weeks apart to maximally decrease your pain.  After the initial series, we generally will wait some months before a repeat injection of the same type.  If you have any questions, please call [phone] [facility]  Occipital Nerve Block Patient Information  Description: The occipital nerves originate in the cervical (neck) spinal cord and travel upward through muscle and tissue to supply sensation to the back of the head and top of the scalp.  In addition, the nerves control some of the muscles of the scalp.  Occipital neuralgia is an irritation of these nerves which can cause headaches, numbness of the scalp, and neck discomfort.     The occipital nerve block will interrupt nerve transmission through these nerves and can relieve pain and spasm.  The block consists of insertion of a small needle under the skin in the back of the head to deposit local anesthetic (numbing medicine) and/or steroids around the nerve.  The entire block usually lasts less than 5 minutes.  Conditions which may be treated by occipital blocks:  Muscular pain and spasm of the scalp Nerve irritation, back of the head Headaches Upper neck pain  Preparation for the injection:  Do not eat any solid food or dairy products within 8 hours of your appointment. You may drink clear liquids up to 3 hours before appointment.  Clear liquids include water , black coffee, juice or soda.  No milk or cream please. You may take your regular medication, including pain medications, with a sip of water  before you appointment.  Diabetics should hold regular insulin  (if taken separately) and take 1/2 normal NPH dose the morning of the procedure.  Carry some sugar containing items with you to your appointment. A driver must accompany you and be prepared to drive you home after your procedure. Bring all your current medications with you. An  IV may be inserted and sedation may be given at the discretion of the physician. A blood pressure cuff, EKG, and other monitors will often be applied during the procedure.  Some patients may need to have extra oxygen administered for a short period. You will be asked to provide medical information, including your allergies and medications, prior to the procedure.  We must know immediately if you are taking blood thinners (like Coumadin/Warfarin) or if you are allergic to IV iodine  contrast (dye).  We must know if you could possible be pregnant.  Do not wear a high collared shirt or turtleneck.  Tie long hair up in the back if possible.  Possible side-effects:  Bleeding from needle site Infection (rare, may require surgery) Nerve injury (rare) Hair on back of neck can be tinged with iodine  scrub (this will wash out) Light-headedness (temporary) Pain at injection site (several days) Decreased blood pressure (rare, temporary) Seizure (very rare)  Call if you experience:  Hives or difficulty breathing ( go to the emergency room) Inflammation or drainage at the injection site(s)  Please note:  Although the local anesthetic injected can often make your painful muscles or headache feel good for several hours after  the injection, the pain may return.  It takes 3-7 days for steroids to work.  You may not notice any pain relief for at least one week.  If effective, we will often do a series of injections spaced 3-6 weeks apart to maximally decrease your pain.  If you have any questions, please call [phone] [facility]  "

## 2024-06-29 ENCOUNTER — Ambulatory Visit: Admitting: Physician Assistant

## 2024-06-29 DIAGNOSIS — I10 Essential (primary) hypertension: Secondary | ICD-10-CM

## 2024-06-29 DIAGNOSIS — R002 Palpitations: Secondary | ICD-10-CM

## 2024-06-29 DIAGNOSIS — E785 Hyperlipidemia, unspecified: Secondary | ICD-10-CM

## 2024-06-29 DIAGNOSIS — I2510 Atherosclerotic heart disease of native coronary artery without angina pectoris: Secondary | ICD-10-CM

## 2024-06-29 DIAGNOSIS — N184 Chronic kidney disease, stage 4 (severe): Secondary | ICD-10-CM

## 2024-07-02 ENCOUNTER — Ambulatory Visit: Admitting: Student in an Organized Health Care Education/Training Program

## 2024-07-02 ENCOUNTER — Encounter: Payer: Self-pay | Admitting: Student in an Organized Health Care Education/Training Program

## 2024-07-02 ENCOUNTER — Ambulatory Visit
Admission: RE | Admit: 2024-07-02 | Discharge: 2024-07-02 | Disposition: A | Source: Ambulatory Visit | Attending: Student in an Organized Health Care Education/Training Program | Admitting: Student in an Organized Health Care Education/Training Program

## 2024-07-02 VITALS — BP 192/91 | HR 77 | Temp 97.9°F | Resp 16 | Ht 63.0 in | Wt 180.0 lb

## 2024-07-02 DIAGNOSIS — G894 Chronic pain syndrome: Secondary | ICD-10-CM

## 2024-07-02 DIAGNOSIS — M461 Sacroiliitis, not elsewhere classified: Secondary | ICD-10-CM | POA: Diagnosis not present

## 2024-07-02 DIAGNOSIS — M25552 Pain in left hip: Secondary | ICD-10-CM | POA: Insufficient documentation

## 2024-07-02 DIAGNOSIS — M545 Low back pain, unspecified: Secondary | ICD-10-CM | POA: Insufficient documentation

## 2024-07-02 DIAGNOSIS — M25551 Pain in right hip: Secondary | ICD-10-CM | POA: Diagnosis not present

## 2024-07-02 DIAGNOSIS — M1990 Unspecified osteoarthritis, unspecified site: Secondary | ICD-10-CM | POA: Diagnosis not present

## 2024-07-02 DIAGNOSIS — M5481 Occipital neuralgia: Secondary | ICD-10-CM

## 2024-07-02 DIAGNOSIS — M533 Sacrococcygeal disorders, not elsewhere classified: Secondary | ICD-10-CM

## 2024-07-02 DIAGNOSIS — G5703 Lesion of sciatic nerve, bilateral lower limbs: Secondary | ICD-10-CM | POA: Diagnosis not present

## 2024-07-02 DIAGNOSIS — M7918 Myalgia, other site: Secondary | ICD-10-CM | POA: Diagnosis not present

## 2024-07-02 MED ORDER — DEXAMETHASONE SOD PHOSPHATE PF 10 MG/ML IJ SOLN
20.0000 mg | Freq: Once | INTRAMUSCULAR | Status: AC
  Administered 2024-07-02: 20 mg
  Filled 2024-07-02: qty 2

## 2024-07-02 MED ORDER — LIDOCAINE HCL 2 % IJ SOLN
20.0000 mL | Freq: Once | INTRAMUSCULAR | Status: AC
  Administered 2024-07-02: 400 mg
  Filled 2024-07-02: qty 20

## 2024-07-02 MED ORDER — ROPIVACAINE HCL 2 MG/ML IJ SOLN
18.0000 mL | Freq: Once | INTRAMUSCULAR | Status: AC
  Administered 2024-07-02: 18 mL via PERINEURAL
  Filled 2024-07-02: qty 20

## 2024-07-02 MED ORDER — IOHEXOL 180 MG/ML  SOLN
INTRAMUSCULAR | Status: AC
  Filled 2024-07-02: qty 20

## 2024-07-02 MED ORDER — ROPIVACAINE HCL 2 MG/ML IJ SOLN
9.0000 mL | Freq: Once | INTRAMUSCULAR | Status: AC
  Administered 2024-07-02: 9 mL via INTRA_ARTICULAR
  Filled 2024-07-02: qty 20

## 2024-07-02 MED ORDER — METHYLPREDNISOLONE ACETATE 80 MG/ML IJ SUSP
80.0000 mg | Freq: Once | INTRAMUSCULAR | Status: AC
  Administered 2024-07-02: 80 mg via INTRA_ARTICULAR
  Filled 2024-07-02: qty 1

## 2024-07-02 MED ORDER — IOHEXOL 180 MG/ML  SOLN
10.0000 mL | Freq: Once | INTRAMUSCULAR | Status: AC
  Administered 2024-07-02: 10 mL via INTRA_ARTICULAR
  Filled 2024-07-02: qty 20

## 2024-07-02 NOTE — Progress Notes (Signed)
 PROVIDER NOTE: Interpretation of information contained herein should be left to medically-trained personnel. Specific patient instructions are provided elsewhere under Patient Instructions section of medical record. This document was created in part using STT-dictation technology, any transcriptional errors that may result from this process are unintentional.  Patient: [name] Type: Established DOB: [birth year] MRN: [id number] PCP: Fernand Fredy RAMAN, MD  Service: Procedure DOS: 07/02/2024 Setting: Ambulatory Location: Ambulatory outpatient facility Delivery: Face-to-face Provider: Wallie Sherry, MD Specialty: Interventional Pain Management Specialty designation: 09 Location: Outpatient facility Ref. Prov.: Sherry Wallie, MD       Interventional Therapy   Primary Reason for Visit: Interventional Pain Management Treatment. CC: Other (Pain that starts at the base of the skull and travels all the way forward on her head ) and Back Pain (Lumbar right is worse )    Procedure:          Anesthesia, Analgesia, Anxiolysis:  Type: Diagnostic, Greater, Occipital Nerve Block  #1  Region: Posterolateral Cervical Level: Occipital Ridge   Laterality: Bilateral  Anesthesia: Local (1-2% Lidocaine )     Position: Prone  Occipital neuralgia  NAS-11 Pain score:   Pre-procedure: 5 /10   Post-procedure: 0-No pain/10     H&P (Pre-op Assessment):  [name] is a 79 y.o. (year old), female patient, seen today for interventional treatment. She  has a past surgical history that includes Cataract extraction; Cervical fusion (N/A); Cardiac catheterization (Left, 02/23/2007); Aneurysm coiling (08/2013); Back surgery; Bartholin cyst marsupialization; Upper esophageal endoscopic ultrasound (eus) (N/A, 04/15/2016); Breast cyst aspiration (Right, 01/25/2012); Colonoscopy with propofol  (N/A, 10/04/2019); polypectomy (N/A, 10/04/2019); Breast excisional biopsy (Left, 06/26/2007); Bunionectomy (Bilateral,  2022); CORONARY STENT INTERVENTION (Left, 02/27/2007); Cardiac catheterization (Left, 07/31/2007); Cardiac catheterization (Left, 10/28/2010); Cardiac catheterization (Left, 05/31/2013); Carotid arteriogram; 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 (N/A, 07/18/2006); 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 (N/A, 10/03/2006); Endovascular near complete 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 [name] has a current medication list which includes the following prescription(s): albuterol , allopurinol , aspirin  ec, calcitriol, carisoprodol , carvedilol, citalopram , enalapril, ezetimibe , fluticasone , furosemide , gabapentin , glimepiride , hydralazine , hydroxychloroquine , labetalol , lidocaine , losartan , nitroglycerin , ondansetron , rosuvastatin , symbicort, tramadol , tretinoin, and zolpidem . Her primarily concern today is the Other (Pain that starts at the base of the skull and travels all the way forward on her head ) and Back Pain (Lumbar right is worse )  Initial Vital Signs:  Pulse/HCG Rate: 77ECG Heart Rate: 73 Temp: 97.9 F (36.6 C) Resp: 16 BP: (!) 190/74 SpO2: 97 %  BMI: Estimated body mass index is 31.89 kg/m as calculated from the following:   Height as of this encounter: 5' 3 (1.6 m).   Weight as of this encounter: 180 lb (81.6 kg).  Risk Assessment: Allergies: Reviewed. She is  allergic to isosorbide dinitrate, amlodipine, sulfa antibiotics, tizanidine hcl, and oxycodone .  Allergy Precautions: None required Coagulopathies: Reviewed. None identified.  Blood-thinner therapy: None at this time Active Infection(s): Reviewed. None identified. [name] is afebrile  Site Confirmation: [name] was asked to confirm the procedure and laterality before marking the site Procedure checklist: Completed Consent: Before the procedure and  under the influence of no sedative(s), amnesic(s), or anxiolytics, the patient was informed of the treatment options, risks and possible complications. To fulfill our ethical and legal obligations, as recommended by the American Medical Association's Code of Ethics, I have informed the patient of my clinical impression; the nature and purpose of the treatment or procedure; the risks, benefits, and possible complications of the intervention; the alternatives, including doing nothing; the risk(s) and benefit(s) of the alternative treatment(s) or procedure(s); and the risk(s) and benefit(s) of doing nothing. The patient was provided information about the general risks and possible complications associated with the procedure. These may include, but are not limited to: failure to achieve desired goals, infection, bleeding, organ or nerve damage, allergic reactions, paralysis, and death. In addition, the patient was informed of those risks and complications associated to the procedure, such as failure to decrease pain; infection; bleeding; organ or nerve damage with subsequent damage to sensory, motor, and/or autonomic systems, resulting in permanent pain, numbness, and/or weakness of one or several areas of the body; allergic reactions; (i.e.: anaphylactic reaction); and/or death. Furthermore, the patient was informed of those risks and complications associated with the medications. These include, but are not limited to: allergic reactions (i.e.:  anaphylactic or anaphylactoid reaction(s)); adrenal axis suppression; blood sugar elevation that in diabetics may result in ketoacidosis or comma; water  retention that in patients with history of congestive heart failure may result in shortness of breath, pulmonary edema, and decompensation with resultant heart failure; weight gain; swelling or edema; medication-induced neural toxicity; particulate matter embolism and blood vessel occlusion with resultant organ, and/or nervous system infarction; and/or aseptic necrosis of one or more joints. Finally, the patient was informed that Medicine is not an exact science; therefore, there is also the possibility of unforeseen or unpredictable risks and/or possible complications that may result in a catastrophic outcome. The patient indicated having understood very clearly. We have given the patient no guarantees and we have made no promises. Enough time was given to the patient to ask questions, all of which were answered to the patient's satisfaction. Ms. Landau has indicated that she wanted to continue with the procedure. Attestation: I, the ordering provider, attest that I have discussed with the patient the benefits, risks, side-effects, alternatives, likelihood of achieving goals, and potential problems during recovery for the procedure that I have provided informed consent. Date  Time: 07/02/2024  9:52 AM  Pre-Procedure Preparation:  Monitoring: As per clinic protocol. Respiration, ETCO2, SpO2, BP, heart rate and rhythm monitor placed and checked for adequate function Safety Precautions: Patient was assessed for positional comfort and pressure points before starting the procedure. Time-out: I initiated and conducted the Time-out before starting the procedure, as per protocol. The patient was asked to participate by confirming the accuracy of the Time Out information. Verification of the correct person, site, and procedure were performed and confirmed by  me, the nursing staff, and the patient. Time-out conducted as per Joint Commission's Universal Protocol (UP.01.01.01). Time: 1100 Start Time: 1100 hrs.  Description of Procedure:          Target Area: Area medial to the occipital artery at the level of the superior nuchal ridge Approach: Posterior approach Area Prepped: Entire Posterior Occipital Region ChloraPrep (2% chlorhexidine  gluconate and 70% isopropyl alcohol) Safety Precautions: Aspiration looking for blood return was conducted prior to all injections. At no point did we inject any substances, as a needle was being advanced. No attempts were made at seeking any paresthesias. Safe injection practices  and needle disposal techniques used. Medications properly checked for expiration dates. SDV (single dose vial) medications used. Description of the Procedure: Protocol guidelines were followed. The target area was identified and the area prepped in the usual manner. Skin & deeper tissues infiltrated with local anesthetic. Appropriate amount of time allowed to pass for local anesthetics to take effect. The procedure needles were then advanced to the target area. Proper needle placement secured. Negative aspiration confirmed. Solution injected in intermittent fashion, asking for systemic symptoms every 0.5cc of injectate. The needles were then removed and the area cleansed, making sure to leave some of the prepping solution back to take advantage of its long term bactericidal properties.  8 cc solution made of 4cc of 0.2% ropivacaine , 1 cc of Decadron  10 mg/cc.  4 cc injected for the left greater occipital nerve, 4 cc injected for the right greater occipital nerve   Vitals:   07/02/24 1009 07/02/24 1100 07/02/24 1110  BP: (!) 190/74 (!) 192/92 (!) 192/91  Pulse: 77    Resp: 16 16 16   Temp: 97.9 F (36.6 C)    TempSrc: Temporal    SpO2: 97% 99% 99%  Weight: 180 lb (81.6 kg)    Height: 5' 3 (1.6 m)      Start Time: 1100 hrs. End Time:  1108 hrs. Materials:     Antibiotic Prophylaxis:   Anti-infectives (From admission, onward)    None      Indication(s): None identified  Post-operative Assessment:  Post-procedure Vital Signs:  Pulse/HCG Rate: 7775 Temp: 97.9 F (36.6 C) Resp: 16 BP: (!) 192/91 SpO2: 99 %  EBL: None  Complications: No immediate post-treatment complications observed by team, or reported by patient.  Note: The patient tolerated the entire procedure well. A repeat set of vitals were taken after the procedure and the patient was kept under observation following institutional policy, for this type of procedure. Post-procedural neurological assessment was performed, showing return to baseline, prior to discharge. The patient was provided with post-procedure discharge instructions, including a section on how to identify potential problems. Should any problems arise concerning this procedure, the patient was given instructions to immediately contact us , at any time, without hesitation. In any case, we plan to contact the patient by telephone for a follow-up status report regarding this interventional procedure.  Comments:  No additional relevant information.  Plan of Care (POC)  Orders:  Orders Placed This Encounter  Procedures   DG PAIN CLINIC C-ARM 1-60 MIN NO REPORT    Intraoperative interpretation by procedural physician at [facility].    Standing Status:   Standing    Number of Occurrences:   1    Reason for exam::   Assistance in needle guidance and placement for procedures requiring needle placement in or near specific anatomical locations not easily accessible without such assistance.   Medications ordered for procedure: Meds ordered this encounter  Medications   iohexol  (OMNIPAQUE ) 180 MG/ML injection 10 mL    Must be Myelogram-compatible. If not available, you may substitute with a water -soluble, non-ionic, hypoallergenic, myelogram-compatible radiological contrast medium.    lidocaine  (XYLOCAINE ) 2 % (with pres) injection 400 mg   ropivacaine  (PF) 2 mg/mL (0.2%) (NAROPIN ) injection 18 mL   dexamethasone  (DECADRON ) injection 20 mg   methylPREDNISolone  acetate (DEPO-MEDROL ) injection 80 mg   ropivacaine  (PF) 2 mg/mL (0.2%) (NAROPIN ) injection 9 mL   Medications administered: We administered iohexol , lidocaine , ropivacaine  (PF) 2 mg/mL (0.2%), dexamethasone , methylPREDNISolone  acetate, and ropivacaine  (PF) 2 mg/mL (0.2%).  See the medical record for exact dosing, route, and time of administration.  Follow-up plan:   Return in about 8 weeks (around 08/27/2024) for PPE, F2F dr .     Recent Visits Date Type Provider Dept  06/26/24 Office Visit Marcelino Nurse, MD [facility]  04/23/24 Procedure visit Marcelino Nurse, MD [facility]  Showing recent visits within past 90 days and meeting all other requirements Today's Visits Date Type Provider Dept  07/02/24 Procedure visit Marcelino Nurse, MD [facility]  Showing today's visits and meeting all other requirements Future Appointments Date Type Provider Dept  08/23/24 Appointment Marcelino Nurse, MD [facility]  Showing future appointments within next 90 days and meeting all other requirements   Disposition: Discharge home  Discharge (Date  Time): 07/02/2024; 1117 hrs.   Primary Care Physician: Fernand Fredy RAMAN, MD Location: [facility] Note by: Nurse Marcelino, MD (TTS technology used. I apologize for any typographical errors that were not detected and corrected.) Date: 07/02/2024; Time: 1:07 PM  Disclaimer:  Medicine is not an visual merchandiser. The only guarantee in medicine is that nothing is guaranteed. It is important to note that the decision to proceed with this intervention was based on the information collected from the patient. The Data and conclusions were drawn from the patient's questionnaire, the interview, and the physical examination. Because  the information was provided in large part by the patient, it cannot be guaranteed that it has not been purposely or unconsciously manipulated. Every effort has been made to obtain as much relevant data as possible for this evaluation. It is important to note that the conclusions that lead to this procedure are derived in large part from the available data. Always take into account that the treatment will also be dependent on availability of resources and existing treatment guidelines, considered by other Pain Management Practitioners as being common knowledge and practice, at the time of the intervention. For Medico-Legal purposes, it is also important to point out that variation in procedural techniques and pharmacological choices are the acceptable norm. The indications, contraindications, technique, and results of the above procedure should only be interpreted and judged by a Board-Certified Interventional Pain Specialist with extensive familiarity and expertise in the same exact procedure and technique.

## 2024-07-02 NOTE — Patient Instructions (Signed)

## 2024-07-02 NOTE — Progress Notes (Signed)
 PROVIDER NOTE: Interpretation of information contained herein should be left to medically-trained personnel. Specific patient instructions are provided elsewhere under Patient Instructions section of medical record. This document was created in part using STT-dictation technology, any transcriptional errors that may result from this process are unintentional.  Patient: [name] Type: Established DOB: [birth year] MRN: [id number] PCP: Fernand Fredy RAMAN, MD  Service: Procedure DOS: 07/02/2024 Setting: Ambulatory Location: Ambulatory outpatient facility Delivery: Face-to-face Provider: Wallie Sherry, MD Specialty: Interventional Pain Management Specialty designation: 09 Location: Outpatient facility Ref. Prov.: Sherry Wallie, MD       Interventional Therapy   Procedure: Sacroiliac Joint Steroid Injection #1  & Bilateral Piriformis TPI Laterality: Bilateral     Level: PIIS (Posterior Inferior Iliac Spine)  Target: Interarticular sacroiliac joint. Location: Medial to the postero-medial edge of iliac spine. Region: Lumbosacral-sacrococcygeal. Approach: Inferior postero-medial percutaneous approach. Type of procedure: Percutaneous joint injection.  Imaging: Fluoroscopy-guided Non-spinal ([id number]) Anesthesia: Local anesthesia (1-2% Lidocaine ) DOS: 07/02/2024  Performed by: Wallie Sherry, MD  Purpose: Diagnostic/Therapeutic Indications: Sacroiliac joint pain in the lower back and hip area severe enough to impact quality of life or function. Rationale (medical necessity): procedure needed and proper for the diagnosis and/or treatment of [name]'s medical symptoms and needs. 1. Sacroiliac joint pain   2. Bilateral occipital neuralgia   3. SI joint arthritis   4. Piriformis syndrome of both sides   5. Chronic pain syndrome    NAS-11 Pain score:   Pre-procedure: 5 /10   Post-procedure: 0-No pain/10      Position / Prep / Materials:  Position: Prone  Prep solution:  ChloraPrep (2% chlorhexidine  gluconate and 70% isopropyl alcohol) Prep Area: Entire posterior lumbosacral area  Materials:  Tray: Block Needle(s):  Type: Spinal  Gauge (G): 22  Length: 3.5-in Qty: One (1) per procedure side.  H&P (Pre-op Assessment):  [name] is a 79 y.o. (year old), female patient, seen today for interventional treatment. She  has a past surgical history that includes Cataract extraction; Cervical fusion (N/A); Cardiac catheterization (Left, 02/23/2007); Aneurysm coiling (08/2013); Back surgery; Bartholin cyst marsupialization; Upper esophageal endoscopic ultrasound (eus) (N/A, 04/15/2016); Breast cyst aspiration (Right, 01/25/2012); Colonoscopy with propofol  (N/A, 10/04/2019); polypectomy (N/A, 10/04/2019); Breast excisional biopsy (Left, 06/26/2007); Bunionectomy (Bilateral, 2022); CORONARY STENT INTERVENTION (Left, 02/27/2007); Cardiac catheterization (Left, 07/31/2007); Cardiac catheterization (Left, 10/28/2010); Cardiac catheterization (Left, 05/31/2013); Carotid arteriogram; 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 (N/A, 07/18/2006); 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 (N/A, 10/03/2006); Endovascular near complete 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 [name] has a current medication list which includes the following prescription(s): albuterol ,  allopurinol , aspirin  ec, calcitriol, carisoprodol , carvedilol, citalopram , enalapril, ezetimibe , fluticasone , furosemide , gabapentin , glimepiride , hydralazine , hydroxychloroquine , labetalol , lidocaine , losartan , nitroglycerin , ondansetron , rosuvastatin , symbicort, tramadol , tretinoin, and zolpidem . Her primarily concern today is the Other (Pain that starts at the base of the skull and travels all the way forward on her head ) and Back Pain (Lumbar right is worse )  Initial Vital Signs:  Pulse/HCG Rate: 77ECG Heart Rate: 73 Temp: 97.9 F (36.6 C) Resp: 16 BP: (!) 190/74 SpO2: 97 %  BMI: Estimated body mass index is 31.89 kg/m as calculated from the following:   Height as of this encounter: 5' 3 (1.6 m).   Weight as of this encounter: 180 lb (81.6 kg).  Risk Assessment: Allergies: Reviewed. She is allergic to isosorbide dinitrate, amlodipine, sulfa antibiotics, tizanidine hcl, and oxycodone .  Allergy Precautions: None required Coagulopathies: Reviewed. None identified.  Blood-thinner therapy: None at this time Active Infection(s): Reviewed. None identified. [name] is afebrile  Site Confirmation: [name] was asked to confirm the procedure and laterality before marking the site Procedure checklist: Completed Consent: Before the procedure and under the influence of no sedative(s), amnesic(s), or anxiolytics, the patient was informed of the treatment options, risks and possible complications. To fulfill our ethical and legal obligations, as recommended by the American Medical Association's Code of Ethics, I have informed the patient of my clinical impression; the nature and purpose of the treatment or procedure; the risks, benefits, and possible complications of the intervention; the alternatives, including doing nothing; the risk(s) and benefit(s) of the alternative treatment(s) or procedure(s); and the risk(s) and benefit(s) of doing nothing. The patient was provided information  about the general risks and possible complications associated with the procedure. These may include, but are not limited to: failure to achieve desired goals, infection, bleeding, organ or nerve damage, allergic reactions, paralysis, and death. In addition, the patient was informed of those risks and complications associated to the procedure, such as failure to decrease pain; infection; bleeding; organ or nerve damage with subsequent damage to sensory, motor, and/or autonomic systems, resulting in permanent pain, numbness, and/or weakness of one or several areas of the body; allergic reactions; (i.e.: anaphylactic reaction); and/or death. Furthermore, the patient was informed of those risks and complications associated with the medications. These include, but are not limited to: allergic reactions (i.e.: anaphylactic or anaphylactoid reaction(s)); adrenal axis suppression; blood sugar elevation that in diabetics may result in ketoacidosis or comma; water  retention that in patients with history of congestive heart failure may result in shortness of breath, pulmonary edema, and decompensation with resultant heart failure; weight gain; swelling or edema; medication-induced neural toxicity; particulate matter embolism and blood vessel occlusion with resultant organ, and/or nervous system infarction; and/or aseptic necrosis of one or more joints. Finally, the patient was informed that Medicine is not an exact science; therefore, there is also the possibility of unforeseen or unpredictable risks and/or possible complications that may result in a catastrophic outcome. The patient indicated having understood very clearly. We have given the patient no guarantees and we have made no promises. Enough time was given to the patient to ask questions, all of which were answered to the patient's satisfaction. Ms. Licht has indicated that she wanted to continue with the procedure. Attestation: I, the ordering provider,  attest that I have discussed with the patient the benefits, risks, side-effects, alternatives, likelihood of achieving goals, and potential problems during recovery for the procedure that I have provided informed consent. Date  Time: 07/02/2024  9:52 AM  Pre-Procedure Preparation:  Monitoring: As per clinic protocol. Respiration, ETCO2, SpO2, BP, heart rate and rhythm monitor placed and checked for adequate function Safety Precautions: Patient was assessed for positional comfort and pressure points before starting the procedure. Time-out: I initiated and conducted the Time-out before starting the procedure, as per protocol. The patient was asked to participate by confirming the accuracy of the Time Out information. Verification of the correct person, site, and procedure were performed and confirmed by me, the nursing  staff, and the patient. Time-out conducted as per Joint Commission's Universal Protocol (UP.01.01.01). Time: 1100 Start Time: 1100 hrs.  Description/Narrative of Procedure:          Start Time: 1100 hrs.  Rationale (medical necessity): procedure needed and proper for the diagnosis and/or treatment of the patient's medical symptoms and needs. Procedural Technique Safety Precautions: Aspiration looking for blood return was conducted prior to all injections. At no point did we inject any substances, as a needle was being advanced. No attempts were made at seeking any paresthesias. Safe injection practices and needle disposal techniques used. Medications properly checked for expiration dates. SDV (single dose vial) medications used. Description of the Procedure: Protocol guidelines were followed. The patient was assisted into a comfortable position. The target area was identified and the area prepped in the usual manner. Skin & deeper tissues infiltrated with local anesthetic. Appropriate amount of time allowed to pass for local anesthetics to take effect. The procedure needles were  then advanced to the target area. Proper needle placement secured. Negative aspiration confirmed. Solution injected in intermittent fashion, asking for systemic symptoms every 0.5cc of injectate. The needles were then removed and the area cleansed, making sure to leave some of the prepping solution back to take advantage of its long term bactericidal properties.  Technical description of procedure:  Fluoroscopy using a posterior anterior 45 degree angle from the midline aiming at the anterolateral aspect of the patient was used to find a direct path into the sacroiliac joint, the superior medial to posterior superior iliac spine.  The skin was marked where the desired target and the skin infiltrated with local anesthetics.  The procedure needle was then advanced until the joint was entered.  Once inside of the joint, we then proceeded to inject the desired solution.  10 cc solution made of 9 cc of 0.2% ropivacaine , 1 cc of methylprednisolone , 80 mg/cc.  5 cc injected into the left SI joint, 5 cc injected into the right SI joint.   Afterwards a right & left piriformis trigger point injection was done 1 cm inferior, 1 cm deep, 1 cm lateral to the inferior fissure of the SI joint.  Contrast was injected to confirm piriformis muscle striation.  While injecting, patient did not complain of any pain radiating down his leg.  10 cc solution consisting of 9 cc of 0.2% lidocaine , 1 cc of Decadron  10 mg/cc.  5 cc injected into the left piriformis, 5 cc injected into the right piriformis.     Vitals:   07/02/24 1009 07/02/24 1100 07/02/24 1110  BP: (!) 190/74 (!) 192/92 (!) 192/91  Pulse: 77    Resp: 16 16 16   Temp: 97.9 F (36.6 C)    TempSrc: Temporal    SpO2: 97% 99% 99%  Weight: 180 lb (81.6 kg)    Height: 5' 3 (1.6 m)       End Time: 1108 hrs.  Imaging Guidance (Non-Spinal):          Type of Imaging Technique: Fluoroscopy Guidance (Non-Spinal) Indication(s): Fluoroscopy guidance for needle  placement to enhance accuracy in procedures requiring precise needle localization for targeted delivery of medication in or near specific anatomical locations not easily accessible without such real-time imaging assistance. Exposure Time: Please see nurses notes. Contrast: Before injecting any contrast, we confirmed that the patient did not have an allergy to iodine , shellfish, or radiological contrast. Once satisfactory needle placement was completed at the desired level, radiological contrast was injected. Contrast injected under live  fluoroscopy. No contrast complications. See chart for type and volume of contrast used. Fluoroscopic Guidance: I was personally present during the use of fluoroscopy. Tunnel Vision Technique used to obtain the best possible view of the target area. Parallax error corrected before commencing the procedure. Direction-depth-direction technique used to introduce the needle under continuous pulsed fluoroscopy. Once target was reached, antero-posterior, oblique, and lateral fluoroscopic projection used confirm needle placement in all planes. Images permanently stored in EMR. Interpretation: I personally interpreted the imaging intraoperatively. Adequate needle placement confirmed in multiple planes. Appropriate spread of contrast into desired area was observed. No evidence of afferent or efferent intravascular uptake. Permanent images saved into the patient's record.  Post-operative Assessment:  Post-procedure Vital Signs:  Pulse/HCG Rate: 7775 Temp: 97.9 F (36.6 C) Resp: 16 BP: (!) 192/91 SpO2: 99 %  EBL: None  Complications: No immediate post-treatment complications observed by team, or reported by patient.  Note: The patient tolerated the entire procedure well. A repeat set of vitals were taken after the procedure and the patient was kept under observation following institutional policy, for this type of procedure. Post-procedural neurological assessment was  performed, showing return to baseline, prior to discharge. The patient was provided with post-procedure discharge instructions, including a section on how to identify potential problems. Should any problems arise concerning this procedure, the patient was given instructions to immediately contact us , at any time, without hesitation. In any case, we plan to contact the patient by telephone for a follow-up status report regarding this interventional procedure.  Comments:  No additional relevant information.  Plan of Care (POC)  Orders:  Orders Placed This Encounter  Procedures   DG PAIN CLINIC C-ARM 1-60 MIN NO REPORT    Intraoperative interpretation by procedural physician at [facility].    Standing Status:   Standing    Number of Occurrences:   1    Reason for exam::   Assistance in needle guidance and placement for procedures requiring needle placement in or near specific anatomical locations not easily accessible without such assistance.       Medications ordered for procedure: Meds ordered this encounter  Medications   iohexol  (OMNIPAQUE ) 180 MG/ML injection 10 mL    Must be Myelogram-compatible. If not available, you may substitute with a water -soluble, non-ionic, hypoallergenic, myelogram-compatible radiological contrast medium.   lidocaine  (XYLOCAINE ) 2 % (with pres) injection 400 mg   ropivacaine  (PF) 2 mg/mL (0.2%) (NAROPIN ) injection 18 mL   dexamethasone  (DECADRON ) injection 20 mg   methylPREDNISolone  acetate (DEPO-MEDROL ) injection 80 mg   ropivacaine  (PF) 2 mg/mL (0.2%) (NAROPIN ) injection 9 mL   Medications administered: We administered iohexol , lidocaine , ropivacaine  (PF) 2 mg/mL (0.2%), dexamethasone , methylPREDNISolone  acetate, and ropivacaine  (PF) 2 mg/mL (0.2%).  See the medical record for exact dosing, route, and time of administration.    B/L Cluneal NB 08/24/23, B/L L4,5, S1 MBNB 03/07/24, 04/23/24- did not help, do not recommend RFA at this point B/L  SIJ and Piriformis 07/02/24; B/L GONB 07/02/24    Follow-up plan:   Return in about 8 weeks (around 08/27/2024) for PPE, F2F dr .     Recent Visits Date Type Provider Dept  06/26/24 Office Visit Marcelino Nurse, MD [facility]  04/23/24 Procedure visit Marcelino Nurse, MD [facility]  Showing recent visits within past 90 days and meeting all other requirements Today's Visits Date Type Provider Dept  07/02/24 Procedure visit Marcelino Nurse, MD [facility]  Showing today's visits and meeting all other requirements  Future Appointments Date Type Provider Dept  08/23/24 Appointment Marcelino Nurse, MD [facility]  Showing future appointments within next 90 days and meeting all other requirements   Disposition: Discharge home  Discharge (Date  Time): 07/02/2024; 1117 hrs.   Primary Care Physician: Fernand Fredy RAMAN, MD Location: [facility] Note by: Nurse Marcelino, MD (TTS technology used. I apologize for any typographical errors that were not detected and corrected.) Date: 07/02/2024; Time: 1:07 PM  Disclaimer:  Medicine is not an visual merchandiser. The only guarantee in medicine is that nothing is guaranteed. It is important to note that the decision to proceed with this intervention was based on the information collected from the patient. The Data and conclusions were drawn from the patient's questionnaire, the interview, and the physical examination. Because the information was provided in large part by the patient, it cannot be guaranteed that it has not been purposely or unconsciously manipulated. Every effort has been made to obtain as much relevant data as possible for this evaluation. It is important to note that the conclusions that lead to this procedure are derived in large part from the available data. Always take into account that the treatment will also be dependent on availability of resources and existing treatment guidelines,  considered by other Pain Management Practitioners as being common knowledge and practice, at the time of the intervention. For Medico-Legal purposes, it is also important to point out that variation in procedural techniques and pharmacological choices are the acceptable norm. The indications, contraindications, technique, and results of the above procedure should only be interpreted and judged by a Board-Certified Interventional Pain Specialist with extensive familiarity and expertise in the same exact procedure and technique.

## 2024-07-03 ENCOUNTER — Other Ambulatory Visit: Payer: Self-pay

## 2024-07-03 ENCOUNTER — Telehealth: Payer: Self-pay

## 2024-07-03 DIAGNOSIS — F5101 Primary insomnia: Secondary | ICD-10-CM

## 2024-07-03 NOTE — Telephone Encounter (Signed)
 Post procedure follow up.  Patient states she is doing good.

## 2024-07-05 ENCOUNTER — Ambulatory Visit: Admitting: Internal Medicine

## 2024-07-05 ENCOUNTER — Other Ambulatory Visit: Payer: Self-pay | Admitting: Internal Medicine

## 2024-07-06 NOTE — Progress Notes (Signed)
 Patient notified.

## 2024-07-07 ENCOUNTER — Other Ambulatory Visit: Payer: Self-pay | Admitting: Internal Medicine

## 2024-07-07 DIAGNOSIS — E1159 Type 2 diabetes mellitus with other circulatory complications: Secondary | ICD-10-CM

## 2024-07-09 ENCOUNTER — Telehealth: Payer: Self-pay

## 2024-07-09 NOTE — Patient Instructions (Signed)
 Yariah N Archambeault - I am sorry I was unable to reach you today for our scheduled appointment. I work with Fernand Fredy RAMAN, MD and am calling to support your healthcare needs. Please contact me at [phone]  at your earliest convenience. I look forward to speaking with you soon.   Thank you,  Hendricks Her RN, BSN  [place] [facility] RN Case Manager   Direct [phone]

## 2024-07-11 ENCOUNTER — Telehealth: Payer: Self-pay

## 2024-07-11 NOTE — Patient Instructions (Signed)
 [name] - I am sorry I was unable to reach you today for our scheduled appointment. I work with Fernand Fredy RAMAN, MD and am calling to support your healthcare needs. Please contact me at [phone]  at your earliest convenience. I look forward to speaking with you soon.   Thank you,  Hendricks Her RN, BSN  [place] [facility] RN Case Manager   Direct [phone]

## 2024-07-17 ENCOUNTER — Telehealth: Payer: Self-pay

## 2024-07-17 NOTE — Patient Instructions (Signed)
 Keirstyn N Hammers - I have attempted to call you three times but have been unsuccessful in reaching you. I work with Fernand Fredy RAMAN, MD and am calling to support your healthcare needs. If I can be of assistance to you, please contact me at [phone].     Thank you,  Hendricks Her RN, BSN  [place] [facility] RN Case Manager   Direct [phone]

## 2024-07-20 ENCOUNTER — Ambulatory Visit: Payer: Self-pay | Admitting: Internal Medicine

## 2024-07-20 ENCOUNTER — Ambulatory Visit: Admitting: Internal Medicine

## 2024-07-20 ENCOUNTER — Encounter: Payer: Self-pay | Admitting: Internal Medicine

## 2024-07-20 VITALS — BP 130/80 | HR 71 | Ht 63.0 in | Wt 180.2 lb

## 2024-07-20 DIAGNOSIS — I152 Hypertension secondary to endocrine disorders: Secondary | ICD-10-CM

## 2024-07-20 DIAGNOSIS — D631 Anemia in chronic kidney disease: Secondary | ICD-10-CM

## 2024-07-20 DIAGNOSIS — E1165 Type 2 diabetes mellitus with hyperglycemia: Secondary | ICD-10-CM | POA: Diagnosis not present

## 2024-07-20 DIAGNOSIS — G8929 Other chronic pain: Secondary | ICD-10-CM | POA: Diagnosis not present

## 2024-07-20 DIAGNOSIS — E1169 Type 2 diabetes mellitus with other specified complication: Secondary | ICD-10-CM

## 2024-07-20 DIAGNOSIS — F5101 Primary insomnia: Secondary | ICD-10-CM

## 2024-07-20 DIAGNOSIS — M545 Low back pain, unspecified: Secondary | ICD-10-CM

## 2024-07-20 DIAGNOSIS — E1159 Type 2 diabetes mellitus with other circulatory complications: Secondary | ICD-10-CM | POA: Diagnosis not present

## 2024-07-20 DIAGNOSIS — N185 Chronic kidney disease, stage 5: Secondary | ICD-10-CM

## 2024-07-20 DIAGNOSIS — E782 Mixed hyperlipidemia: Secondary | ICD-10-CM | POA: Diagnosis not present

## 2024-07-20 LAB — POCT CBG (FASTING - GLUCOSE)-MANUAL ENTRY: Glucose Fasting, POC: 176 mg/dL — AB (ref 70–99)

## 2024-07-20 NOTE — Progress Notes (Signed)
 "  Established Patient Office Visit  Subjective:  Patient ID: [name], female    DOB: 1945-09-27  Age: 79 y.o. MRN: [id number]  Chief Complaint  Patient presents with   Follow-up    6 week follow up    Patient comes in for her follow-up today.  At her most recent visit with the nephrologist she was found to have a low H&H, due to her chronic kidney disease.  She has been referred to hematology for possible Epogen injections or IV iron  infusion. Patient reports that nobody has called her yet, will send referral again. She reports of fatigue, chronic neck and back pain.  She had a CT of the head done which did not show any aneurysmal bleed.  She is under care of pain management for her pain control.  She thinks th the joint injections are not helping her and we will request a different form of pain management. Patient reports that she monitors her blood pressure and then self medicates herself with her blood pressure medications.  Patient advised against it.  She should not take any extra extra doses unless approved by her physicians.  Also advised to bring her blood pressure monitor for calibration.    No other concerns at this time.   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 b.)07/18/2006 -endovas oblit complex ACOM aneur. c.) Endovas Tx of 6.8x45mm RMCA aneur. d.) 3.5x40mm remnant RMCA aneur 2/2 coil compaction. e.) Interval 3.2x3.60mm saccular outpouching in ACOM c/w mild recannulization in neck. 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 g.) 3.7x66mm remnant of prev Tx'd ACOM aneur -failed embol 02/24/2012.   CKD (chronic kidney disease), stage IV (HCC)    a.) solitary functioning kidney on the RIGHT   Complication of anesthesia    a.) delayed emergence   Coronary artery disease 02/23/2007   a.) LHC 02/23/2007 --> EF 50%; 30% pLAD, 70%  mRCA --> planned for staged PCI. b.) PCI 02/27/2007: EF 60%; 3.5 x 15 mm Vision BMS to 80% mRCA. b.) LHC 07/24/2007: EF 60%; minor irregs; no occlusive CAD; no intervention. c.) LHC 10/28/2010: EF 60%; 30% mLAD, LCx with minor luminal irregs, 20% ISR mRCA; no intervention. d.) LHC 05/31/2013: EF 60%; 40% mLAD, 30% ISR m-dRCA; no interventions.   DDD (degenerative disc disease), cervical    a.) s/p ACDF C5-C7; hardware in neck; patient appreciates stiffness and issues with mobility   Diastolic dysfunction    a.)  TTE 07/07/2021: EF 87.8%; normal PASP; trace TR/MR; G1DD.   Elevated LEFT hemidiaphragm    Gout    Headache(784.0)    Heart murmur    HLD (hyperlipidemia)    HOH (hard of hearing)    Has hearing aids, doesn't wear   Hyperkalemia    Hyperparathyroidism due to renal insufficiency    Hypertension    Incomplete right bundle branch block (RBBB)    Insomnia    a.) takes zolpidem    Long term (current) use of immunomodulator    a.) on DMARD therapy (hydroxychloroquine ) for RA/SLE Dx.   Lumbar adjacent segment disease with spondylolisthesis    Multiple acquired cysts of kidney    On chronic clopidogrel  therapy    OSA (obstructive sleep apnea)    a.) does NOT use nocturnal pap therapy   Osteoarthritis    Pancreatic cyst    Peripheral  vascular disease    Pneumonia    Post-COVID chronic cough    Pulmonary nodule    RBBB (right bundle branch block)    Rectal bleeding    Rheumatoid arthritis (HCC)    a.) on DMARD; hydroxychloriquine   Shortness of breath 03/01/2023   Systemic lupus erythematosus (HCC)    T2DM (type 2 diabetes mellitus) (HCC)    Tendinitis of left wrist    Thickened endometrium    Thickened endometrium    Wears dentures    partial lower    Past Surgical History:  Procedure Laterality Date   ANEURYSM COILING  08/2013   ANTERIOR LATERAL LUMBAR FUSION WITH PERCUTANEOUS SCREW 1 LEVEL N/A 03/09/2023   Procedure: L3-4 LATERAL LUMBAR INTERBODY FUSION;  Surgeon:  Clois Fret, MD;  Location: [facility];  Service: Neurosurgery;  Laterality: N/A;   APPLICATION OF INTRAOPERATIVE CT SCAN N/A 03/09/2023   Procedure: APPLICATION OF INTRAOPERATIVE CT SCAN;  Surgeon: Clois Fret, MD;  Location: [facility];  Service: Neurosurgery;  Laterality: N/A;   Attempted embolization of previously treated ACOM aneurysm; unsuccessful N/A 01/28/2012   Location: [facility]   BACK SURGERY     BARTHOLIN CYST MARSUPIALIZATION     BICEPT TENODESIS Right 03/02/2022   Procedure: BICEPS TENODESIS;  Surgeon: Edie Norleen PARAS, MD;  Location: [facility];  Service: Orthopedics;  Laterality: Right;   BREAST CYST ASPIRATION Right 01/25/2012   FNA neg.   BREAST EXCISIONAL BIOPSY Left 06/26/2007   neg   BUNIONECTOMY Bilateral 2022   CARDIAC CATHETERIZATION Left 02/23/2007   Procedure: CARDIAC CATHETERIZATION; Location: [facility]; Surgeon: Denyse Bathe, MD   CARDIAC CATHETERIZATION Left 07/31/2007   Procedure: CARDIAC CATHETERIZATION; Location: [facility]; Surgeon: Denyse Bathe, MD   CARDIAC CATHETERIZATION Left 10/28/2010   Procedure: CARDIAC CATHETERIZATION; Location: [facility]; Surgeon: Denyse Bathe, MD   CARDIAC CATHETERIZATION Left 05/31/2013   Procedure: CARDIAC CATHETERIZATION; Location: [facility]; Surgeon: Denyse Bathe, MD   Carotid arteriogram; 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 N/A 07/18/2006   Location: [facility]   CATARACT EXTRACTION     CERVICAL FUSION N/A    Procedure: ACDF C5-C7   COLONOSCOPY WITH PROPOFOL  N/A 10/04/2019   Procedure: COLONOSCOPY WITH BIOPSY;  Surgeon: Unk Corinn Skiff, MD;  Location: [facility];  Service: Endoscopy;  Laterality: N/A;  Diabetic (borderline) - oral meds priority 3   CORONARY STENT INTERVENTION Left 02/27/2007   Procedure: CORONARY STENT INTERVENTION (3.5 x 15 mm Vision BMS to mRCA); Location: [facility]; Surgeon: Deatrice Cage, MD   Endovascular near complete obliteration of enlarging  neck remnant of previously treated RIGHT MCA aneurysm using stent assisted coiling N/A 09/03/2010   Location: [facility]   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 N/A 10/03/2006   Location: [facility]   ESOPHAGOGASTRODUODENOSCOPY N/A 04/06/2024   Procedure: EGD (ESOPHAGOGASTRODUODENOSCOPY);  Surgeon: Rollin Dover, MD;  Location: [facility];  Service: Gastroenterology;  Laterality: N/A;   EUS N/A 04/06/2024   Procedure: ULTRASOUND, UPPER GI TRACT, ENDOSCOPIC;  Surgeon: Rollin Dover, MD;  Location: [facility];  Service: Gastroenterology;  Laterality: N/A;   FINE NEEDLE ASPIRATION  04/06/2024   Procedure: FINE NEEDLE ASPIRATION;  Surgeon: Rollin Dover, MD;  Location: [facility];  Service: Gastroenterology;;   HYSTEROSCOPY WITH D & C N/A 08/04/2021   Procedure: DILATATION AND CURETTAGE /HYSTEROSCOPY;  Surgeon: Victor Claudell SAUNDERS, MD;  Location: [facility];  Service: Gynecology;  Laterality: N/A;   POLYPECTOMY N/A 10/04/2019   Procedure: POLYPECTOMY;  Surgeon: Unk,  Corinn Skiff, MD;  Location: [facility];  Service: Endoscopy;  Laterality: N/A;   REVERSE SHOULDER ARTHROPLASTY Right 03/02/2022   Procedure: REVERSE SHOULDER ARTHROPLASTY;  Surgeon: Edie Norleen PARAS, MD;  Location: [facility];  Service: Orthopedics;  Laterality: Right;   UPPER ESOPHAGEAL ENDOSCOPIC ULTRASOUND (EUS) N/A 04/15/2016   Procedure: UPPER ESOPHAGEAL ENDOSCOPIC ULTRASOUND (EUS);  Surgeon: Asberry DELENA Coffee, MD;  Location: [facility];  Service: Gastroenterology;  Laterality: N/A;    Social History   Socioeconomic History   Marital status: Widowed    Spouse name: Not on file   Number of children: Not on file   Years of education: Not on file   Highest education level: Not on file  Occupational History   Not on file  Tobacco Use   Smoking status: Never    Passive exposure: Never   Smokeless tobacco: Never  Vaping Use   Vaping status: Never Used   Substance and Sexual Activity   Alcohol use: Yes    Comment: Occasionally at christmas   Drug use: No   Sexual activity: Not Currently    Birth control/protection: Post-menopausal  Other Topics Concern   Not on file  Social History Narrative   Lives with daughter   Social Drivers of Health   Tobacco Use: Low Risk (07/20/2024)   Patient History    Smoking Tobacco Use: Never    Smokeless Tobacco Use: Never    Passive Exposure: Never  Financial Resource Strain: Low Risk (02/01/2024)   Overall Financial Resource Strain (CARDIA)    Difficulty of Paying Living Expenses: Not hard at all  Food Insecurity: No Food Insecurity (06/06/2024)   Epic    Worried About Radiation Protection Practitioner of Food in the Last Year: Never true    Ran Out of Food in the Last Year: Never true  Transportation Needs: No Transportation Needs (06/06/2024)   Epic    Lack of Transportation (Medical): No    Lack of Transportation (Non-Medical): No  Physical Activity: Insufficiently Active (02/01/2024)   Exercise Vital Sign    Days of Exercise per Week: 2 days    Minutes of Exercise per Session: 30 min  Stress: No Stress Concern Present (10/11/2023)   [employer] of Occupational Health - Occupational Stress Questionnaire    Feeling of Stress : Not at all  Social Connections: Moderately Isolated (10/11/2023)   Social Connection and Isolation Panel    Frequency of Communication with Friends and Family: More than three times a week    Frequency of Social Gatherings with Friends and Family: More than three times a week    Attends Religious Services: Never    Database Administrator or Organizations: Yes    Attends Banker Meetings: Never    Marital Status: Widowed  Intimate Partner Violence: Not At Risk (06/06/2024)   Epic    Fear of Current or Ex-Partner: No    Emotionally Abused: No    Physically Abused: No    Sexually Abused: No  Depression (PHQ2-9): Low Risk (06/26/2024)   Depression (PHQ2-9)    PHQ-2  Score: 1  Alcohol Screen: Low Risk (05/31/2023)   Alcohol Screen    Last Alcohol Screening Score (AUDIT): 0  Housing: Low Risk (06/06/2024)   Epic    Unable to Pay for Housing in the Last Year: No    Number of Times Moved in the Last Year: 0    Homeless in the Last Year: No  Utilities: Not At Risk (06/06/2024)   Epic  Threatened with loss of utilities: No  Health Literacy: Adequate Health Literacy (02/01/2024)   B1300 Health Literacy    Frequency of need for help with medical instructions: Never    Family History  Problem Relation Age of Onset   Breast cancer Paternal Aunt 43   Breast cancer Paternal Aunt    Heart disease Mother    Heart disease Father    Stroke Father    Diabetes Sister    Diabetes Brother     Allergies[1]  Show/hide medication list[2]  Review of Systems  Constitutional: Negative.  Negative for chills, fever and malaise/fatigue.  HENT: Negative.  Negative for congestion and sore throat.   Eyes: Negative.  Negative for blurred vision and pain.  Respiratory: Negative.  Negative for cough and shortness of breath.   Cardiovascular: Negative.  Negative for chest pain, palpitations and leg swelling.  Gastrointestinal: Negative.  Negative for abdominal pain, blood in stool, constipation, diarrhea, heartburn, melena, nausea and vomiting.  Genitourinary: Negative.  Negative for dysuria, flank pain, frequency and urgency.  Musculoskeletal:  Positive for back pain, joint pain and neck pain. Negative for myalgias.  Skin: Negative.   Neurological:  Positive for headaches. Negative for dizziness, tingling, sensory change and weakness.  Endo/Heme/Allergies: Negative.   Psychiatric/Behavioral: Negative.  Negative for depression and suicidal ideas. The patient is not nervous/anxious.        Objective:   BP 130/80   Pulse 71   Ht 5' 3 (1.6 m)   Wt 180 lb 3.2 oz (81.7 kg)   SpO2 97%   BMI 31.92 kg/m   Vitals:   07/20/24 1255  BP: 130/80  Pulse: 71   Height: 5' 3 (1.6 m)  Weight: 180 lb 3.2 oz (81.7 kg)  SpO2: 97%  BMI (Calculated): 31.93    Physical Exam Vitals and nursing note reviewed.  Constitutional:      Appearance: Normal appearance.  HENT:     Head: Normocephalic and atraumatic.     Nose: Nose normal.     Mouth/Throat:     Mouth: Mucous membranes are moist.     Pharynx: Oropharynx is clear.  Eyes:     Conjunctiva/sclera: Conjunctivae normal.     Pupils: Pupils are equal, round, and reactive to light.  Cardiovascular:     Rate and Rhythm: Normal rate and regular rhythm.     Pulses: Normal pulses.     Heart sounds: Normal heart sounds. No murmur heard. Pulmonary:     Effort: Pulmonary effort is normal.     Breath sounds: Normal breath sounds. No wheezing.  Abdominal:     General: Bowel sounds are normal.     Palpations: Abdomen is soft.     Tenderness: There is no abdominal tenderness. There is no right CVA tenderness or left CVA tenderness.  Musculoskeletal:        General: Normal range of motion.     Cervical back: Normal range of motion.     Right lower leg: No edema.     Left lower leg: No edema.  Skin:    General: Skin is warm and dry.  Neurological:     General: No focal deficit present.     Mental Status: She is alert and oriented to person, place, and time.  Psychiatric:        Mood and Affect: Mood normal.        Behavior: Behavior normal.      Results for orders placed or performed in visit on 07/20/24  POCT CBG (Fasting - Glucose)  Result Value Ref Range   Glucose Fasting, POC 176 (A) 70 - 99 mg/dL    Recent Results (from the past 2160 hours)  POCT CBG (Fasting - Glucose)     Status: Abnormal   Collection Time: 05/01/24  1:54 PM  Result Value Ref Range   Glucose Fasting, POC 126 (A) 70 - 99 mg/dL  POCT CBG (Fasting - Glucose)     Status: Abnormal   Collection Time: 06/05/24  1:34 PM  Result Value Ref Range   Glucose Fasting, POC 122 (A) 70 - 99 mg/dL  POCT Urinalysis Dipstick  (18997)     Status: Abnormal   Collection Time: 06/05/24  2:11 PM  Result Value Ref Range   Color, UA Yellow    Clarity, UA Very cloudy    Glucose, UA Negative Negative   Bilirubin, UA Negative    Ketones, UA Negative    Spec Grav, UA 1.025 1.010 - 1.025   Blood, UA Negative    pH, UA 5.0 5.0 - 8.0   Protein, UA Positive (A) Negative   Urobilinogen, UA 0.2 0.2 or 1.0 E.U./dL   Nitrite, UA Positive    Leukocytes, UA Small (1+) (A) Negative   Appearance Very cloudy    Odor Yes   Urine Culture     Status: Abnormal   Collection Time: 06/05/24  3:49 PM   Specimen: Urine   UR  Result Value Ref Range   Urine Culture, Routine Final report (A)    Organism ID, Bacteria Escherichia coli (A)     Comment: Cefazolin  with an MIC <=16 predicts susceptibility to the oral agents cefaclor, cefdinir , cefpodoxime , cefprozil, cefuroxime, cephalexin , and loracarbef when used for therapy of uncomplicated urinary tract infections due to E. coli, Klebsiella pneumoniae, and Proteus mirabilis. Multi-Drug Resistant Organism Greater than 100,000 colony forming units per mL    Antimicrobial Susceptibility Comment     Comment:       ** S = Susceptible; I = Intermediate; R = Resistant **                    P = Positive; N = Negative             MICS are expressed in micrograms per mL    Antibiotic                 RSLT#1    RSLT#2    RSLT#3    RSLT#4 Amoxicillin /Clavulanic Acid    I Ampicillin                     R Cefazolin                       S Cefepime                       S Cefoxitin                      S Cefpodoxime                     S Ceftriaxone                    S Ciprofloxacin                   R Ertapenem  S Gentamicin                     S Levofloxacin                    R Meropenem                      S Nitrofurantoin                 S Piperacillin/Tazobactam        S Tetracycline                   R Tobramycin                     S Trimethoprim/Sulfa              R   POCT CBG (Fasting - Glucose)     Status: None   Collection Time: 06/08/24  3:56 PM  Result Value Ref Range   Glucose Fasting, POC 98 70 - 99 mg/dL  POCT CBG (Fasting - Glucose)     Status: Abnormal   Collection Time: 06/25/24 11:06 AM  Result Value Ref Range   Glucose Fasting, POC 146 (A) 70 - 99 mg/dL  POCT Urinalysis Dipstick (18997)     Status: Abnormal   Collection Time: 06/25/24 11:26 AM  Result Value Ref Range   Color, UA Yellow    Clarity, UA Slightly cloudy    Glucose, UA Negative Negative   Bilirubin, UA Negative    Ketones, UA Negative    Spec Grav, UA 1.020 1.010 - 1.025   Blood, UA Negative    pH, UA 5.0 5.0 - 8.0   Protein, UA Positive (A) Negative   Urobilinogen, UA 0.2 0.2 or 1.0 E.U./dL   Nitrite, UA Negative    Leukocytes, UA Trace (A) Negative   Appearance Slightly cloudy    Odor Yes   CMP14+EGFR     Status: Abnormal   Collection Time: 06/25/24 11:54 AM  Result Value Ref Range   Glucose 103 (H) 70 - 99 mg/dL   BUN 69 (H) 8 - 27 mg/dL   Creatinine, Ser 5.45 (H) 0.57 - 1.00 mg/dL   eGFR 9 (L) >40 fO/fpw/8.26   BUN/Creatinine Ratio 15 12 - 28   Sodium 149 (H) 134 - 144 mmol/L   Potassium 4.7 3.5 - 5.2 mmol/L   Chloride 118 (HH) 96 - 106 mmol/L   CO2 17 (L) 20 - 29 mmol/L   Calcium  8.8 8.7 - 10.3 mg/dL   Total Protein 5.5 (L) 6.0 - 8.5 g/dL   Albumin  3.8 3.8 - 4.8 g/dL   Globulin, Total 1.7 1.5 - 4.5 g/dL   Bilirubin Total 0.5 0.0 - 1.2 mg/dL   Alkaline Phosphatase 70 49 - 135 IU/L   AST 12 0 - 40 IU/L   ALT 9 0 - 32 IU/L  Lipid Panel w/o Chol/HDL Ratio     Status: Abnormal   Collection Time: 06/25/24 11:54 AM  Result Value Ref Range   Cholesterol, Total 196 100 - 199 mg/dL   Triglycerides 843 (H) 0 - 149 mg/dL   HDL 40 >60 mg/dL   VLDL Cholesterol Cal 28 5 - 40 mg/dL   LDL Chol Calc (NIH) 871 (H) 0 - 99 mg/dL  CBC with Differential/Platelet     Status: Abnormal   Collection Time: 06/25/24 11:54 AM  Result Value Ref Range   WBC  4.9 3.4 -  10.8 x10E3/uL   RBC 2.85 (L) 3.77 - 5.28 x10E6/uL   Hemoglobin 8.4 (L) 11.1 - 15.9 g/dL   Hematocrit 73.1 (L) 65.9 - 46.6 %   MCV 94 79 - 97 fL   MCH 29.5 26.6 - 33.0 pg   MCHC 31.3 (L) 31.5 - 35.7 g/dL   RDW 86.9 88.2 - 84.5 %   Platelets 156 150 - 450 x10E3/uL   Neutrophils 59 Not Estab. %   Lymphs 31 Not Estab. %   Monocytes 5 Not Estab. %   Eos 4 Not Estab. %   Basos 1 Not Estab. %   Neutrophils Absolute 2.9 1.4 - 7.0 x10E3/uL   Lymphocytes Absolute 1.5 0.7 - 3.1 x10E3/uL   Monocytes Absolute 0.2 0.1 - 0.9 x10E3/uL   EOS (ABSOLUTE) 0.2 0.0 - 0.4 x10E3/uL   Basophils Absolute 0.0 0.0 - 0.2 x10E3/uL   Immature Granulocytes 0 Not Estab. %   Immature Grans (Abs) 0.0 0.0 - 0.1 x10E3/uL  Hemoglobin A1c     Status: None   Collection Time: 06/25/24 11:54 AM  Result Value Ref Range   Hgb A1c MFr Bld 5.5 4.8 - 5.6 %    Comment:          Prediabetes: 5.7 - 6.4          Diabetes: >6.4          Glycemic control for adults with diabetes: <7.0    Est. average glucose Bld gHb Est-mCnc 111 mg/dL  POCT CBG (Fasting - Glucose)     Status: Abnormal   Collection Time: 07/20/24  1:10 PM  Result Value Ref Range   Glucose Fasting, POC 176 (A) 70 - 99 mg/dL      Assessment & Plan:  Referral sent again to hematology for anemia due to chronic kidney disease.  Continue current medications.  Patient advised to bring her blood pressure monitor for calibration. Problem List Items Addressed This Visit       Cardiovascular and Mediastinum   Hypertension associated with diabetes (HCC) - Primary   Relevant Medications   metolazone (ZAROXOLYN) 2.5 MG tablet     Endocrine   Type 2 diabetes mellitus with hyperglycemia, without long-term current use of insulin  (HCC)   Relevant Orders   POCT CBG (Fasting - Glucose) (Completed)   Combined hyperlipidemia associated with type 2 diabetes mellitus (HCC)   Relevant Medications   metolazone (ZAROXOLYN) 2.5 MG tablet     Genitourinary   CKD (chronic  kidney disease) stage 5, GFR less than 15 ml/min (HCC)   Relevant Orders   Ambulatory referral to Hematology / Oncology     Other   Absolute anemia   Relevant Orders   Ambulatory referral to Hematology / Oncology   Chronic right-sided low back pain without sciatica   Relevant Medications   oxyCODONE  (OXY IR/ROXICODONE ) 5 MG immediate release tablet   Other Visit Diagnoses       Primary insomnia           Return in about 3 months (around 10/18/2024).   Total time spent: 30 minutes. This time includes review of previous notes and results and patient face to face interaction during today's visit.    FERNAND FREDY RAMAN, MD  07/20/2024   This document may have been prepared by [facility] Voice Recognition software and as such may include unintentional dictation errors.      [1]  Allergies Allergen Reactions   Isosorbide Dinitrate Other (See Comments)  Collapse   Amlodipine Swelling   Sulfa Antibiotics Itching   Tizanidine Hcl    Oxycodone  Itching  [2]  Outpatient Medications Prior to Visit  Medication Sig   albuterol  (VENTOLIN  HFA) 108 (90 Base) MCG/ACT inhaler Inhale 2 puffs into the lungs every 6 (six) hours as needed for wheezing or shortness of breath.   allopurinol  (ZYLOPRIM ) 100 MG tablet TAKE 1 TABLET BY MOUTH EVERY DAY   aspirin  EC 81 MG tablet Take 1 tablet (81 mg total) by mouth daily. Swallow whole.   fluticasone  (FLONASE ) 50 MCG/ACT nasal spray Place 2 sprays into both nostrils daily. SPRAY 2 SPRAYS INTO EACH NOSTRIL EVERY DAY   furosemide  (LASIX ) 40 MG tablet TAKE 1 TABLET BY MOUTH EVERY DAY   gabapentin  (NEURONTIN ) 100 MG capsule Take 1-3 capsules (100-300 mg total) by mouth at bedtime.   glimepiride  (AMARYL ) 2 MG tablet TAKE 1 TABLET (2 MG TOTAL) BY MOUTH SEE ADMIN INSTRUCTIONS. TAKE 1 MG DAILY, MAY INCREASE TO 2 MG IF BLOOD SUGAR IS OVER 175   hydrALAZINE  (APRESOLINE ) 100 MG tablet TAKE 1 TABLET BY MOUTH TWICE A DAY   hydroxychloroquine  (PLAQUENIL ) 200 MG  tablet Take 200 mg by mouth daily.   labetalol  (NORMODYNE ) 300 MG tablet TAKE 1 TABLET BY MOUTH TWICE A DAY   lidocaine  (LIDODERM ) 5 % Use 1 patch every 24 hours. Can cut patch into smaller pieces to apply to multiple locations remove patch after 12 hours of use.   losartan  (COZAAR ) 100 MG tablet TAKE 1 TABLET BY MOUTH EVERY DAY IN THE MORNING   metolazone (ZAROXOLYN) 2.5 MG tablet Take 2.5 mg by mouth once a week.   nitroGLYCERIN  (NITROSTAT ) 0.6 MG SL tablet TAKE 1 TABLET BY MOUTH AS NEEDED FOR CP. WAIT 5 MINUTES BEFORE NEXT DOSE. PROCEED TO ER IF NO RELIEF AFTER 3 DOSES (Patient taking differently: Place 0.6 mg under the tongue every 5 (five) minutes as needed.)   oxyCODONE  (OXY IR/ROXICODONE ) 5 MG immediate release tablet Take 5 mg by mouth every 4 (four) hours as needed for severe pain (pain score 7-10).   rosuvastatin  (CRESTOR ) 40 MG tablet TAKE 1 TABLET BY MOUTH EVERY DAY   SYMBICORT 80-4.5 MCG/ACT inhaler INHALE 1 PUFF BY MOUTH TWICE A DAY   traMADol  (ULTRAM ) 50 MG tablet Take 1 tablet (50 mg total) by mouth every 6 (six) hours as needed for moderate pain (pain score 4-6).   tretinoin (RETIN-A) 0.05 % cream APPLY TO AFFECTED AREA EVERY DAY AT BEDTIME   zolpidem  (AMBIEN ) 5 MG tablet Take 0.5 tablet nightly or 1 tablet every other night to help with sleep   calcitRIOL (ROCALTROL) 0.25 MCG capsule Take 0.25 mcg by mouth. (Patient not taking: Reported on 07/20/2024)   carisoprodol  (SOMA ) 250 MG tablet Take 1.5 tablets (375 mg total) by mouth in the morning and at bedtime. (Patient not taking: Reported on 07/20/2024)   carvedilol (COREG) 25 MG tablet Take 25 mg by mouth 2 (two) times daily. (Patient not taking: Reported on 07/20/2024)   citalopram  (CELEXA ) 10 MG tablet TAKE 1 TABLET BY MOUTH EVERY DAY (Patient not taking: Reported on 07/20/2024)   enalapril (VASOTEC) 5 MG tablet Take 5 mg by mouth daily. (Patient not taking: Reported on 07/20/2024)   ezetimibe  (ZETIA ) 10 MG tablet Take 1 tablet (10 mg  total) by mouth daily. (Patient not taking: Reported on 07/20/2024)   ondansetron  (ZOFRAN ) 4 MG tablet Take 1 tablet (4 mg total) by mouth every 8 (eight) hours as needed for nausea  or vomiting. (Patient not taking: Reported on 07/20/2024)   No facility-administered medications prior to visit.   "

## 2024-07-24 ENCOUNTER — Inpatient Hospital Stay

## 2024-07-24 ENCOUNTER — Inpatient Hospital Stay: Admitting: Internal Medicine

## 2024-07-24 ENCOUNTER — Encounter: Payer: Self-pay | Admitting: Internal Medicine

## 2024-07-24 VITALS — BP 156/74 | HR 65 | Temp 96.4°F | Resp 20 | Ht 63.0 in | Wt 178.4 lb

## 2024-07-24 DIAGNOSIS — D649 Anemia, unspecified: Secondary | ICD-10-CM

## 2024-07-24 LAB — CBC WITH DIFFERENTIAL/PLATELET
Abs Immature Granulocytes: 0.02 10*3/uL (ref 0.00–0.07)
Basophils Absolute: 0 10*3/uL (ref 0.0–0.1)
Basophils Relative: 1 %
Eosinophils Absolute: 0.2 10*3/uL (ref 0.0–0.5)
Eosinophils Relative: 3 %
HCT: 28.4 % — ABNORMAL LOW (ref 36.0–46.0)
Hemoglobin: 9.1 g/dL — ABNORMAL LOW (ref 12.0–15.0)
Immature Granulocytes: 0 %
Lymphocytes Relative: 28 %
Lymphs Abs: 1.6 10*3/uL (ref 0.7–4.0)
MCH: 30 pg (ref 26.0–34.0)
MCHC: 32 g/dL (ref 30.0–36.0)
MCV: 93.7 fL (ref 80.0–100.0)
Monocytes Absolute: 0.3 10*3/uL (ref 0.1–1.0)
Monocytes Relative: 5 %
Neutro Abs: 3.5 10*3/uL (ref 1.7–7.7)
Neutrophils Relative %: 63 %
Platelets: 151 10*3/uL (ref 150–400)
RBC: 3.03 MIL/uL — ABNORMAL LOW (ref 3.87–5.11)
RDW: 13.9 % (ref 11.5–15.5)
WBC: 5.6 10*3/uL (ref 4.0–10.5)
nRBC: 0 % (ref 0.0–0.2)

## 2024-07-24 LAB — RETICULOCYTES
Immature Retic Fract: 5.7 % (ref 2.3–15.9)
RBC.: 3.02 MIL/uL — ABNORMAL LOW (ref 3.87–5.11)
Retic Count, Absolute: 29.3 10*3/uL (ref 19.0–186.0)
Retic Ct Pct: 1 % (ref 0.4–3.1)

## 2024-07-24 LAB — COMPREHENSIVE METABOLIC PANEL WITH GFR
ALT: 12 U/L (ref 0–44)
AST: 17 U/L (ref 15–41)
Albumin: 3.6 g/dL (ref 3.5–5.0)
Alkaline Phosphatase: 77 U/L (ref 38–126)
Anion gap: 12 (ref 5–15)
BUN: 71 mg/dL — ABNORMAL HIGH (ref 8–23)
CO2: 19 mmol/L — ABNORMAL LOW (ref 22–32)
Calcium: 9.2 mg/dL (ref 8.9–10.3)
Chloride: 112 mmol/L — ABNORMAL HIGH (ref 98–111)
Creatinine, Ser: 4.88 mg/dL — ABNORMAL HIGH (ref 0.44–1.00)
GFR, Estimated: 9 mL/min — ABNORMAL LOW
Glucose, Bld: 160 mg/dL — ABNORMAL HIGH (ref 70–99)
Potassium: 4.4 mmol/L (ref 3.5–5.1)
Sodium: 143 mmol/L (ref 135–145)
Total Bilirubin: 0.7 mg/dL (ref 0.0–1.2)
Total Protein: 5.8 g/dL — ABNORMAL LOW (ref 6.5–8.1)

## 2024-07-24 LAB — FOLATE: Folate: 7.7 ng/mL

## 2024-07-24 LAB — ABO/RH: ABO/RH(D): O POS

## 2024-07-24 LAB — LACTATE DEHYDROGENASE: LDH: 200 U/L (ref 105–235)

## 2024-07-24 LAB — FERRITIN: Ferritin: 90 ng/mL (ref 11–307)

## 2024-07-24 LAB — IRON AND TIBC
Iron: 83 ug/dL (ref 28–170)
Saturation Ratios: 27 % (ref 10.4–31.8)
TIBC: 304 ug/dL (ref 250–450)
UIBC: 221 ug/dL

## 2024-07-24 LAB — VITAMIN B12: Vitamin B-12: 437 pg/mL (ref 180–914)

## 2024-07-24 NOTE — Progress Notes (Signed)
 Fatigue/weakness: YES Dyspena: YES DOE Light headedness: OCC. Blood in stool:  NO

## 2024-07-25 LAB — ERYTHROPOIETIN: Erythropoietin: 7.3 m[IU]/mL (ref 2.6–18.5)

## 2024-07-25 LAB — KAPPA/LAMBDA LIGHT CHAINS
Kappa free light chain: 39.9 mg/L — ABNORMAL HIGH (ref 3.3–19.4)
Kappa, lambda light chain ratio: 0.99 (ref 0.26–1.65)
Lambda free light chains: 40.5 mg/L — ABNORMAL HIGH (ref 5.7–26.3)

## 2024-07-26 ENCOUNTER — Inpatient Hospital Stay

## 2024-07-26 VITALS — BP 164/69 | HR 67 | Temp 97.9°F | Resp 18

## 2024-07-26 DIAGNOSIS — D649 Anemia, unspecified: Secondary | ICD-10-CM

## 2024-07-26 LAB — MULTIPLE MYELOMA PANEL, SERUM
Albumin SerPl Elph-Mcnc: 3.1 g/dL (ref 2.9–4.4)
Albumin/Glob SerPl: 1.5 (ref 0.7–1.7)
Alpha 1: 0.2 g/dL (ref 0.0–0.4)
Alpha2 Glob SerPl Elph-Mcnc: 0.6 g/dL (ref 0.4–1.0)
B-Globulin SerPl Elph-Mcnc: 0.9 g/dL (ref 0.7–1.3)
Gamma Glob SerPl Elph-Mcnc: 0.4 g/dL (ref 0.4–1.8)
Globulin, Total: 2.2 g/dL (ref 2.2–3.9)
IgA: 135 mg/dL (ref 64–422)
IgG (Immunoglobin G), Serum: 589 mg/dL (ref 586–1602)
IgM (Immunoglobulin M), Srm: 17 mg/dL — ABNORMAL LOW (ref 26–217)
Total Protein ELP: 5.3 g/dL — ABNORMAL LOW (ref 6.0–8.5)

## 2024-07-26 MED ORDER — IRON SUCROSE 20 MG/ML IV SOLN
200.0000 mg | Freq: Once | INTRAVENOUS | Status: AC
  Administered 2024-07-26: 200 mg via INTRAVENOUS
  Filled 2024-07-26: qty 10

## 2024-07-26 NOTE — Patient Instructions (Signed)
 Iron  Sucrose Injection What is this medication? IRON  SUCROSE (EYE ern SOO krose) treats low levels of iron  (iron  deficiency anemia) in people with kidney disease. Iron  is a mineral that plays an important role in making red blood cells, which carry oxygen from your lungs to the rest of your body. This medicine may be used for other purposes; ask your health care provider or pharmacist if you have questions. COMMON BRAND NAME(S): Venofer  What should I tell my care team before I take this medication? They need to know if you have any of these conditions: Anemia not caused by low iron  levels Heart disease High levels of iron  in the blood Kidney disease Liver disease An unusual or allergic reaction to iron , other medications, foods, dyes, or preservatives Pregnant or trying to get pregnant Breastfeeding How should I use this medication? This medication is infused into a vein. It is given by your care team in a hospital or clinic setting. Talk to your care team about the use of this medication in children. While it may be prescribed for children as young as 2 years for selected conditions, precautions do apply. Overdosage: If you think you have taken too much of this medicine contact a poison control center or emergency room at once. NOTE: This medicine is only for you. Do not share this medicine with others. What if I miss a dose? Keep appointments for follow-up doses. It is important not to miss your dose. Call your care team if you are unable to keep an appointment. What may interact with this medication? Do not take this medication with any of the following: Deferoxamine Dimercaprol Other iron  products This medication may also interact with the following: Chloramphenicol Deferasirox This list may not describe all possible interactions. Give your health care provider a list of all the medicines, herbs, non-prescription drugs, or dietary supplements you use. Also tell them if you smoke,  drink alcohol, or use illegal drugs. Some items may interact with your medicine. What should I watch for while using this medication? Your condition will be monitored carefully while you are receiving this medication. Tell your care team if your symptoms do not start to get better or if they get worse. You may need blood work done while you are taking this medication. Sometimes, when medications are infused into veins, a little can leak out of the vein and into the tissue around it. If this medication leaks, it can cause a brown or dark stain on the skin. This is not common. It may be permanent. If you feel pain or swelling during your infusion, tell your care team right away. They can stop the infusion and treat the area. You may need to eat more foods that contain iron . Talk to your care team. Foods that contain iron  include whole grains or cereals, dried fruits, beans, peas, leafy green vegetables, and organ meats (liver, kidney). What side effects may I notice from receiving this medication? Side effects that you should report to your care team as soon as possible: Allergic reactions--skin rash, itching, hives, swelling of the face, lips, tongue, or throat Low blood pressure--dizziness, feeling faint or lightheaded, blurry vision Painful swelling, warmth, or redness of the skin, brown or dark skin color at the infusion site Shortness of breath Side effects that usually do not require medical attention (report these to your care team if they continue or are bothersome): Flushing Headache Joint pain Muscle pain Nausea This list may not describe all possible side effects. Call your  doctor for medical advice about side effects. You may report side effects to FDA at 1-800-FDA-1088. Where should I keep my medication? This medication is given in a hospital or clinic. It will not be stored at home. NOTE: This sheet is a summary. It may not cover all possible information. If you have questions about  this medicine, talk to your doctor, pharmacist, or health care provider.  2025 [employer] (2024-04-25 00:00:00)

## 2024-08-01 ENCOUNTER — Inpatient Hospital Stay

## 2024-08-02 ENCOUNTER — Ambulatory Visit: Admitting: Internal Medicine

## 2024-08-08 ENCOUNTER — Inpatient Hospital Stay

## 2024-08-21 ENCOUNTER — Inpatient Hospital Stay: Admitting: Internal Medicine

## 2024-08-21 ENCOUNTER — Inpatient Hospital Stay

## 2024-08-23 ENCOUNTER — Ambulatory Visit: Admitting: Student in an Organized Health Care Education/Training Program

## 2024-10-16 ENCOUNTER — Ambulatory Visit: Admitting: Internal Medicine
